# Patient Record
Sex: Female | Born: 1950
Health system: Southern US, Community
[De-identification: ages and names within clinical notes are randomized; demographics above are authoritative.]

## PROBLEM LIST (undated history)

## (undated) DIAGNOSIS — T7840XA Allergy, unspecified, initial encounter: Secondary | ICD-10-CM

## (undated) DIAGNOSIS — I10 Essential (primary) hypertension: Secondary | ICD-10-CM

## (undated) DIAGNOSIS — F32A Depression, unspecified: Secondary | ICD-10-CM

## (undated) DIAGNOSIS — E669 Obesity, unspecified: Secondary | ICD-10-CM

## (undated) DIAGNOSIS — M1990 Unspecified osteoarthritis, unspecified site: Secondary | ICD-10-CM

## (undated) DIAGNOSIS — H269 Unspecified cataract: Secondary | ICD-10-CM

## (undated) DIAGNOSIS — E785 Hyperlipidemia, unspecified: Secondary | ICD-10-CM

## (undated) DIAGNOSIS — F419 Anxiety disorder, unspecified: Secondary | ICD-10-CM

## (undated) DIAGNOSIS — F329 Major depressive disorder, single episode, unspecified: Secondary | ICD-10-CM

## (undated) DIAGNOSIS — K219 Gastro-esophageal reflux disease without esophagitis: Secondary | ICD-10-CM

## (undated) HISTORY — DX: Anxiety disorder, unspecified: F41.9

## (undated) HISTORY — DX: Essential (primary) hypertension: I10

## (undated) HISTORY — DX: Unspecified cataract: H26.9

## (undated) HISTORY — DX: Allergy, unspecified, initial encounter: T78.40XA

## (undated) HISTORY — DX: Depression, unspecified: F32.A

## (undated) HISTORY — PX: EYE SURGERY: SHX253

## (undated) HISTORY — DX: Major depressive disorder, single episode, unspecified: F32.9

## (undated) HISTORY — DX: Gastro-esophageal reflux disease without esophagitis: K21.9

## (undated) HISTORY — DX: Unspecified osteoarthritis, unspecified site: M19.90

## (undated) HISTORY — DX: Hyperlipidemia, unspecified: E78.5

---

## 1989-11-01 HISTORY — PX: ABDOMINAL HYSTERECTOMY: SHX81

## 1999-04-08 ENCOUNTER — Ambulatory Visit: Admission: RE | Admit: 1999-04-08 | Discharge: 1999-04-08 | Payer: Self-pay | Admitting: Occupational Medicine

## 1999-04-08 ENCOUNTER — Encounter: Payer: Self-pay | Admitting: Occupational Medicine

## 2006-06-13 ENCOUNTER — Ambulatory Visit: Payer: Self-pay | Admitting: Family Medicine

## 2006-06-14 ENCOUNTER — Emergency Department (HOSPITAL_COMMUNITY): Admission: EM | Admit: 2006-06-14 | Discharge: 2006-06-14 | Payer: Self-pay | Admitting: Emergency Medicine

## 2006-06-14 IMAGING — CR DG RIBS W/ CHEST 3+V*R*
5 series · 5 of 5 positions shown · non-contrast
Comparison: None available.

CLINICAL DATA: Pain right upper abdomen.  
CHEST ? 1 VIEW AND RIGHT RIBS ? 4 VIEW:

[t ribs ap/pa upper right *]
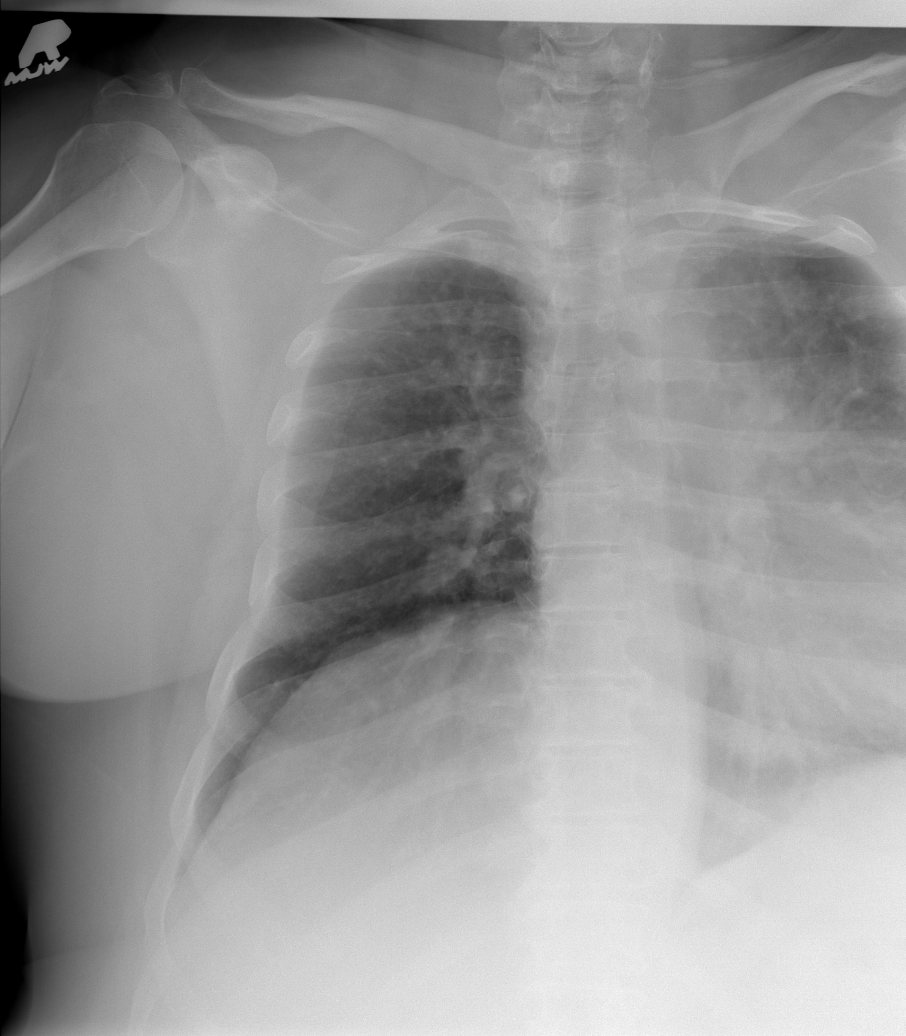

[t ribs ap/pa  lower right]
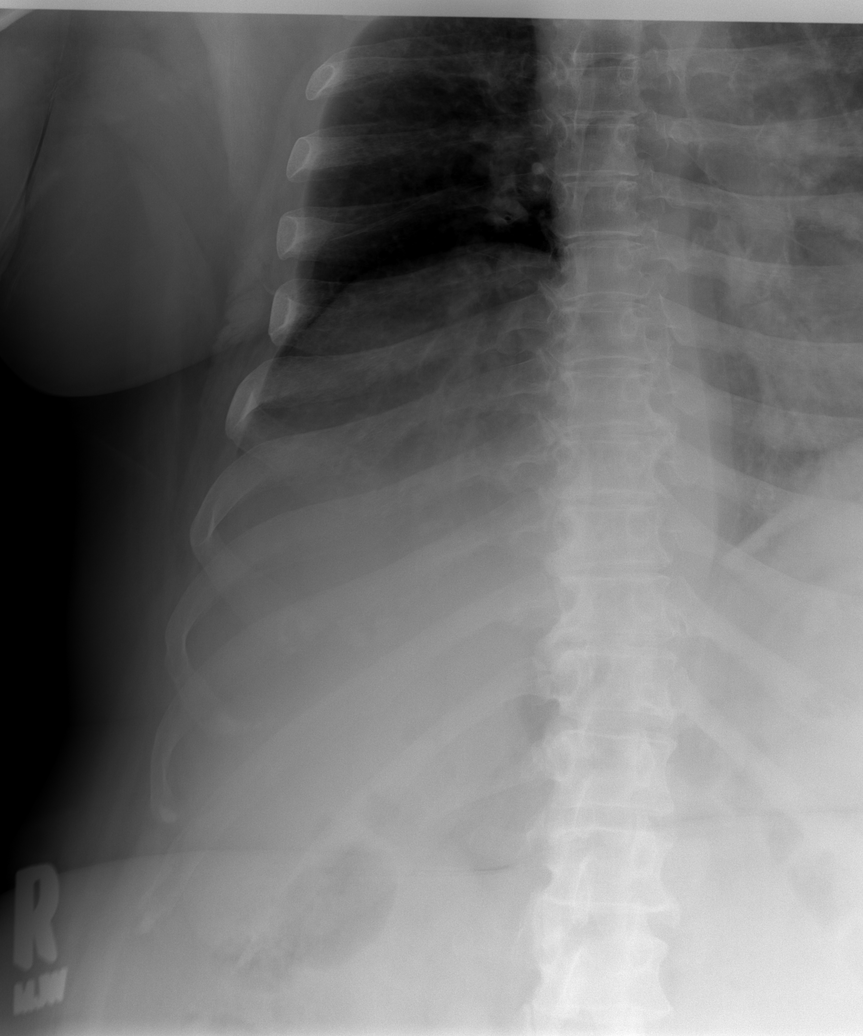

[t ribs obl. right (1 of 2)]
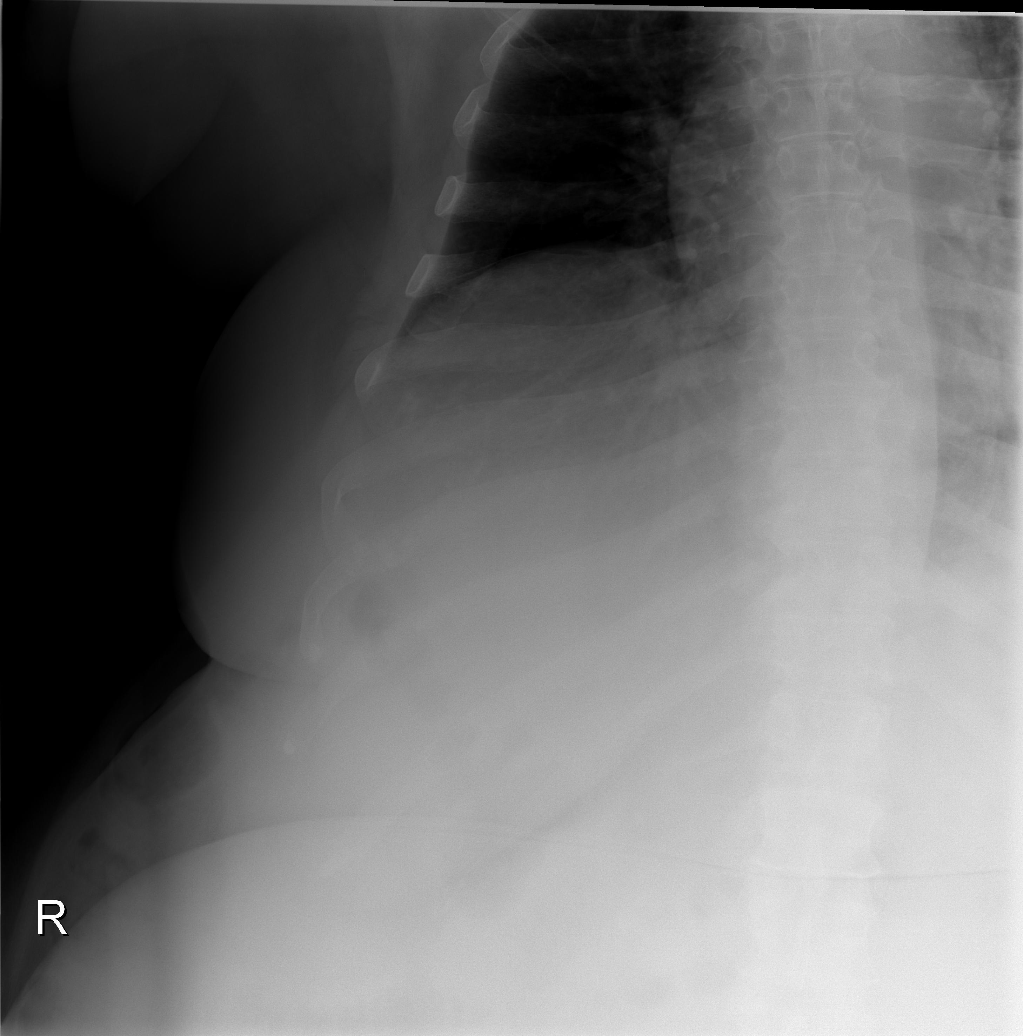

[t ribs obl. right (2 of 2)]
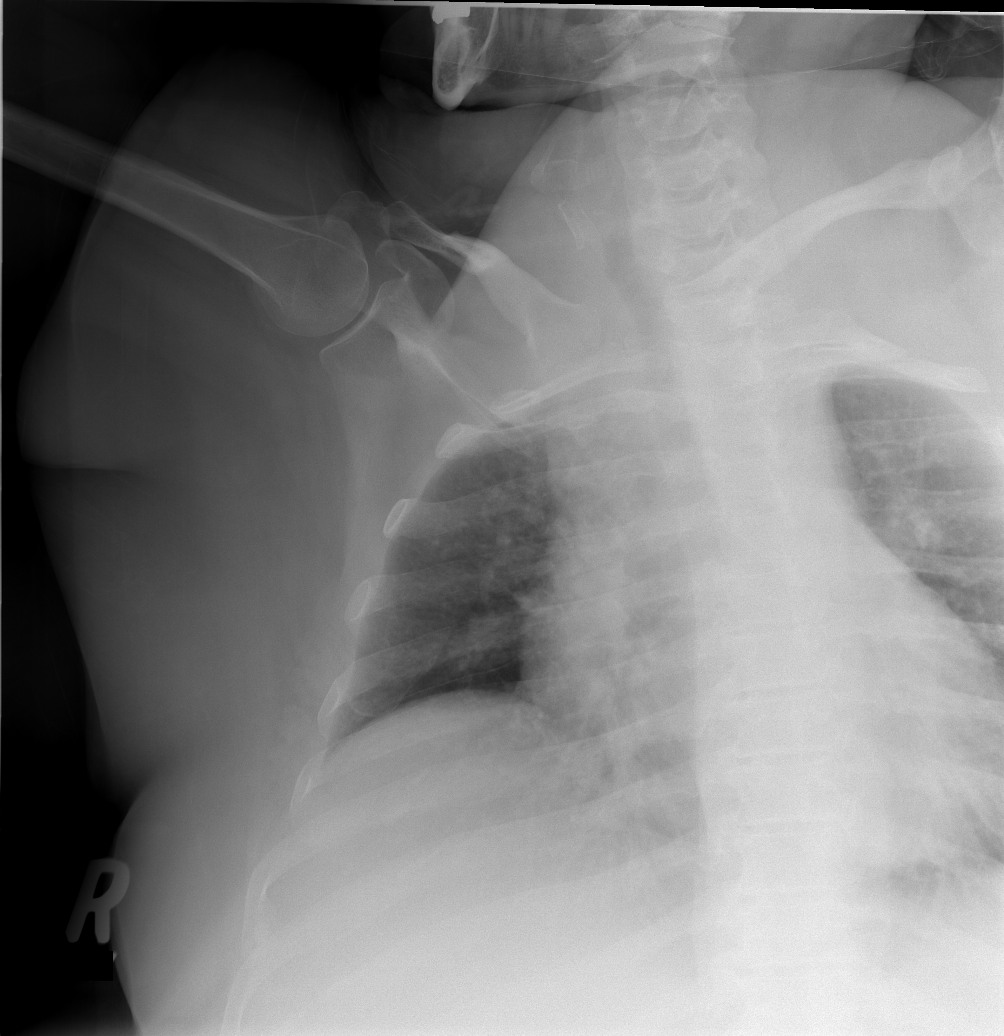

[view not recorded]
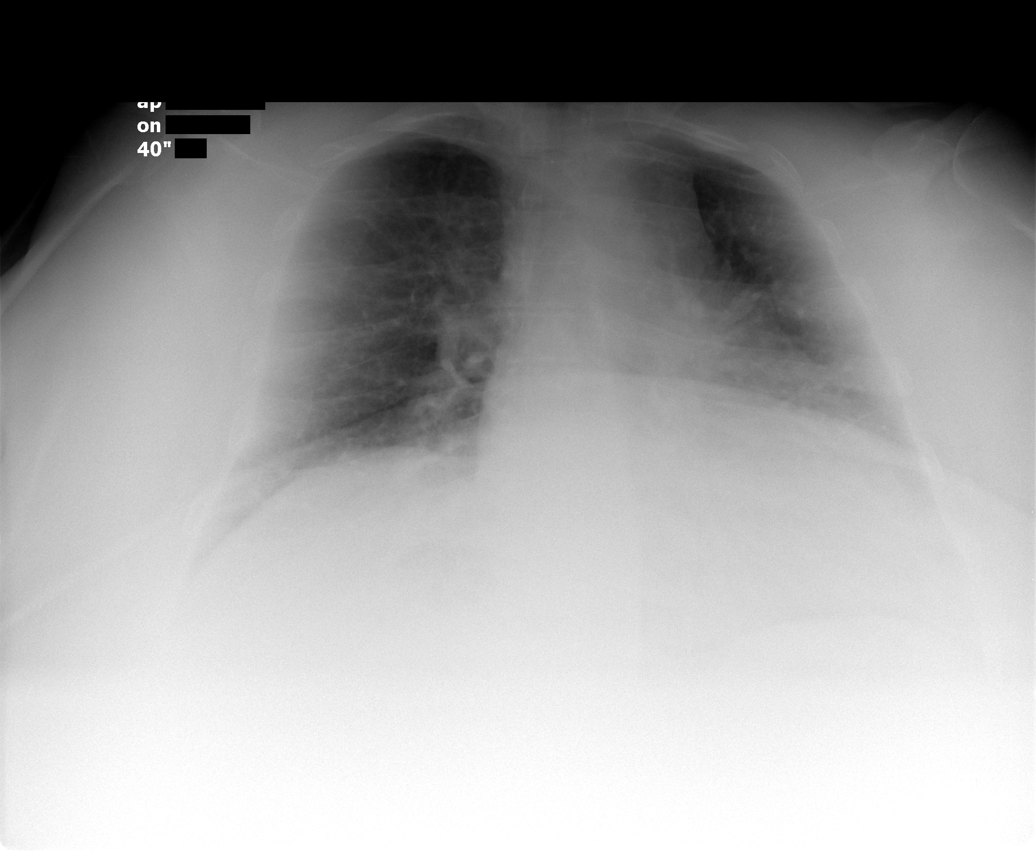

[5 of 5 positions shown; findings below may reference images not displayed]

FINDINGS: The lungs are under inflated.  There is elevation of the right hemidiaphragm.  The heart size appears enlarged.  There are no effusions or edema.  
Review of the ribs shows no fractures.
IMPRESSION: 1.  No rib fractures are identified.  
2.  Lungs are under inflated with elevation of the right hemidiaphragm.

## 2006-06-20 ENCOUNTER — Ambulatory Visit: Payer: Self-pay | Admitting: Family Medicine

## 2006-08-15 ENCOUNTER — Ambulatory Visit: Payer: Self-pay | Admitting: Family Medicine

## 2006-11-15 ENCOUNTER — Ambulatory Visit: Payer: Self-pay | Admitting: Family Medicine

## 2006-11-15 LAB — CONVERTED CEMR LAB
ALT: 17 units/L (ref 0–40)
AST: 16 units/L (ref 0–37)
BUN: 17 mg/dL (ref 6–23)
CO2: 30 meq/L (ref 19–32)
Calcium: 9.3 mg/dL (ref 8.4–10.5)
Chloride: 101 meq/L (ref 96–112)
Cholesterol: 140 mg/dL (ref 0–200)
Creatinine, Ser: 0.7 mg/dL (ref 0.4–1.2)
GFR calc Af Amer: 112 mL/min
GFR calc non Af Amer: 92 mL/min
Glucose, Bld: 126 mg/dL — ABNORMAL HIGH (ref 70–99)
HDL: 54.8 mg/dL (ref >39.0)
Hgb A1c MFr Bld: 6.6 % — ABNORMAL HIGH (ref 4.6–6.0)
LDL Cholesterol: 60 mg/dL (ref 0–99)
Potassium: 4.4 meq/L (ref 3.5–5.1)
Rhuematoid fact SerPl-aCnc: <20 intl units/mL — ABNORMAL LOW (ref 0.0–20.0)
Sodium: 138 meq/L (ref 135–145)
Total CHOL/HDL Ratio: 2.6
Triglycerides: 127 mg/dL (ref 0–149)
VLDL: 25 mg/dL (ref 0–40)

## 2006-11-16 ENCOUNTER — Encounter: Admission: RE | Admit: 2006-11-16 | Discharge: 2006-11-16 | Payer: Self-pay | Admitting: Family Medicine

## 2006-11-16 IMAGING — CR DG HAND COMPLETE 3+V*R*
3 series · 3 of 3 positions shown · non-contrast
Comparison: None.

CLINICAL DATA: Arthralgia.  
 RIGHT HAND THREE VIEWS:

[view not recorded (1 of 3)]
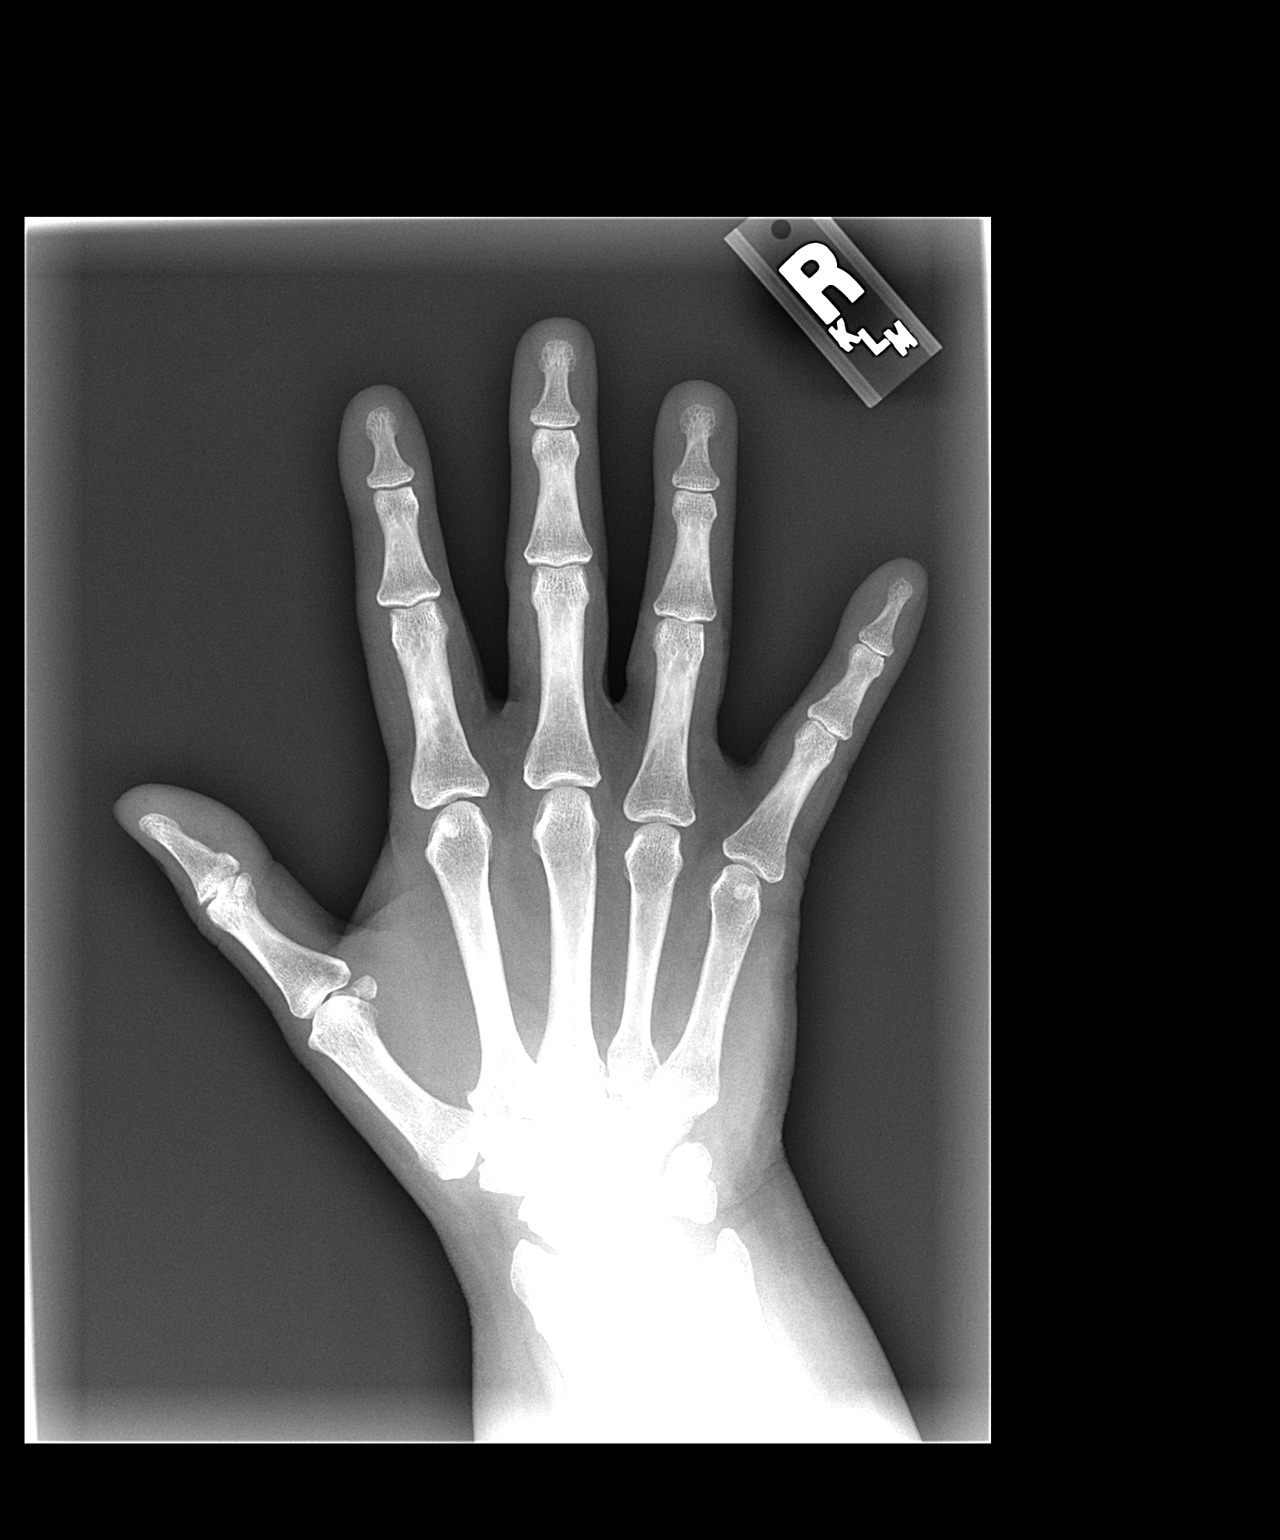

[view not recorded (2 of 3)]
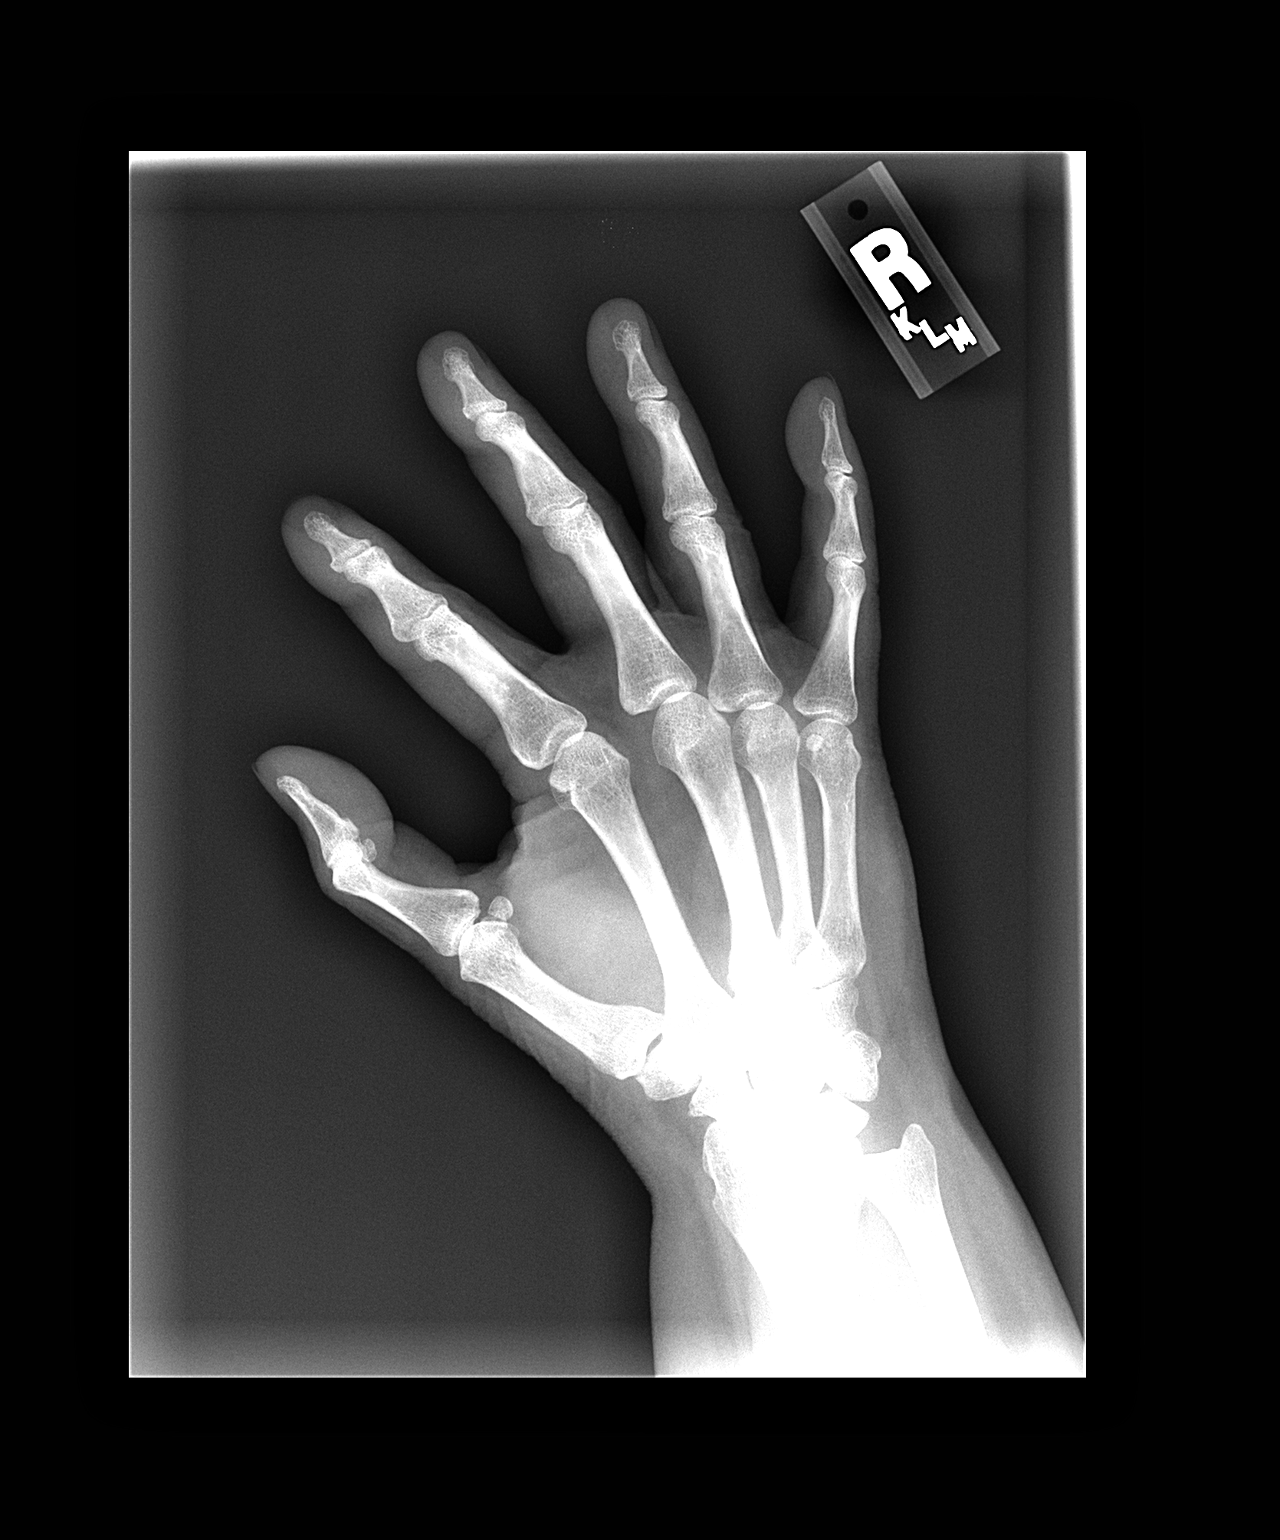

[view not recorded (3 of 3)]
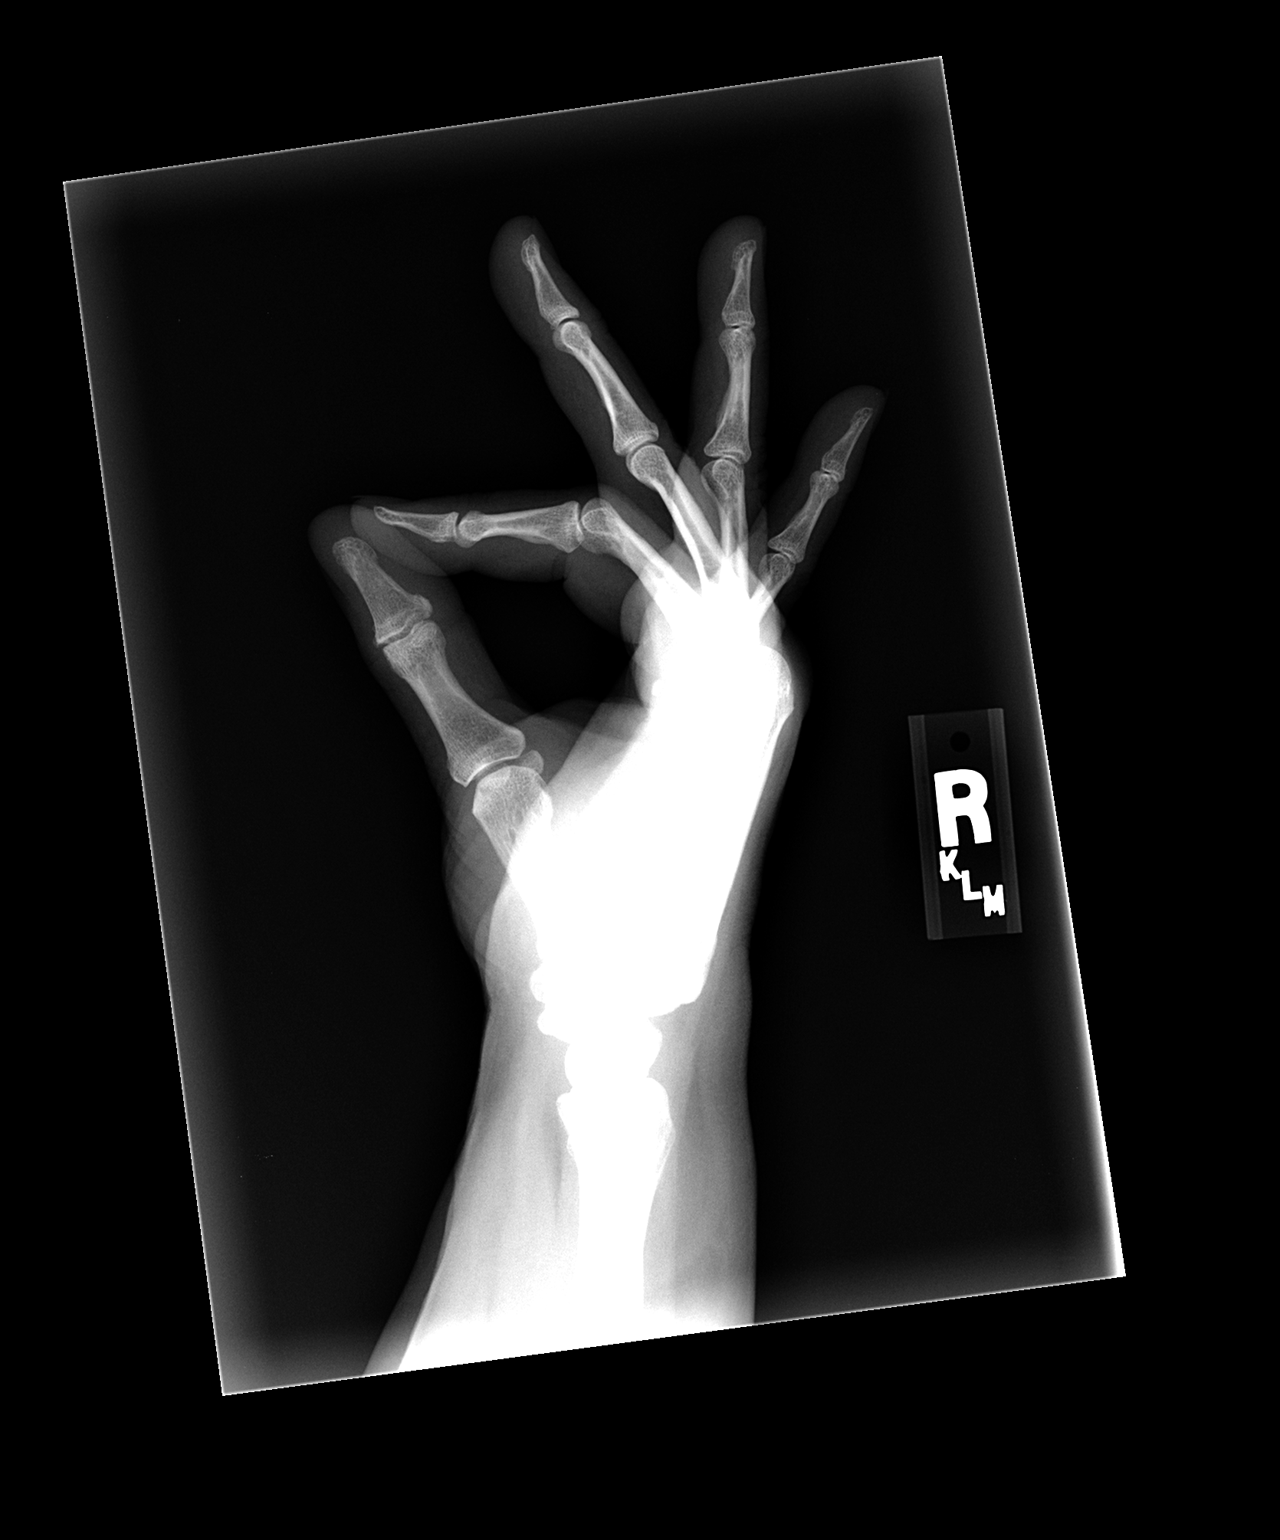

[3 of 3 positions shown; findings below may reference images not displayed]

FINDINGS: No osteophytosis in the interphalangeal joint.  First carpal metacarpal joint is unremarkable.  No erosions.
IMPRESSION: No significant degenerative changes.

## 2006-12-14 ENCOUNTER — Ambulatory Visit: Payer: Self-pay | Admitting: Family Medicine

## 2006-12-14 DIAGNOSIS — K219 Gastro-esophageal reflux disease without esophagitis: Secondary | ICD-10-CM | POA: Insufficient documentation

## 2006-12-14 DIAGNOSIS — I10 Essential (primary) hypertension: Secondary | ICD-10-CM | POA: Insufficient documentation

## 2006-12-14 DIAGNOSIS — E785 Hyperlipidemia, unspecified: Secondary | ICD-10-CM | POA: Insufficient documentation

## 2006-12-14 DIAGNOSIS — F329 Major depressive disorder, single episode, unspecified: Secondary | ICD-10-CM | POA: Insufficient documentation

## 2006-12-14 DIAGNOSIS — F321 Major depressive disorder, single episode, moderate: Secondary | ICD-10-CM | POA: Insufficient documentation

## 2007-03-02 ENCOUNTER — Ambulatory Visit: Payer: Self-pay | Admitting: Family Medicine

## 2007-03-02 LAB — CONVERTED CEMR LAB
ALT: 14 units/L (ref 0–40)
AST: 18 units/L (ref 0–37)
BUN: 14 mg/dL (ref 6–23)
CO2: 34 meq/L — ABNORMAL HIGH (ref 19–32)
Calcium: 8.9 mg/dL (ref 8.4–10.5)
Chloride: 102 meq/L (ref 96–112)
Creatinine, Ser: 0.6 mg/dL (ref 0.4–1.2)
GFR calc Af Amer: 133 mL/min
GFR calc non Af Amer: 110 mL/min
Glucose, Bld: 126 mg/dL — ABNORMAL HIGH (ref 70–99)
Hgb A1c MFr Bld: 6.8 % — ABNORMAL HIGH (ref 4.6–6.0)
Potassium: 4.3 meq/L (ref 3.5–5.1)
Sodium: 143 meq/L (ref 135–145)

## 2007-04-18 ENCOUNTER — Telehealth (INDEPENDENT_AMBULATORY_CARE_PROVIDER_SITE_OTHER): Payer: Self-pay | Admitting: *Deleted

## 2007-04-19 ENCOUNTER — Ambulatory Visit: Payer: Self-pay | Admitting: Family Medicine

## 2007-04-19 DIAGNOSIS — S301XXA Contusion of abdominal wall, initial encounter: Secondary | ICD-10-CM | POA: Insufficient documentation

## 2007-04-23 LAB — CONVERTED CEMR LAB
Basophils Absolute: 0 10*3/uL (ref 0.0–0.1)
Basophils Relative: 0.1 % (ref 0.0–1.0)
Eosinophils Absolute: 0.1 10*3/uL (ref 0.0–0.6)
Eosinophils Relative: 1.7 % (ref 0.0–5.0)
HCT: 36.9 % (ref 36.0–46.0)
Hemoglobin: 12.2 g/dL (ref 12.0–15.0)
INR: 1.2 (ref 0.9–2.0)
Lymphocytes Relative: 17.2 % (ref 12.0–46.0)
MCHC: 33.1 g/dL (ref 30.0–36.0)
MCV: 81.5 fL (ref 78.0–100.0)
Monocytes Absolute: 0.5 10*3/uL (ref 0.2–0.7)
Monocytes Relative: 6.8 % (ref 3.0–11.0)
Neutro Abs: 5.6 10*3/uL (ref 1.4–7.7)
Neutrophils Relative %: 74.2 % (ref 43.0–77.0)
Platelets: 248 10*3/uL (ref 150–400)
Prothrombin Time: 13.3 s (ref 10.0–14.0)
RBC: 4.53 M/uL (ref 3.87–5.11)
RDW: 15.8 % — ABNORMAL HIGH (ref 11.5–14.6)
WBC: 7.5 10*3/uL (ref 4.5–10.5)
aPTT: 37.3 s — ABNORMAL HIGH (ref 26.5–36.5)

## 2007-04-24 ENCOUNTER — Encounter (INDEPENDENT_AMBULATORY_CARE_PROVIDER_SITE_OTHER): Payer: Self-pay | Admitting: *Deleted

## 2007-07-04 ENCOUNTER — Ambulatory Visit: Payer: Self-pay | Admitting: Family Medicine

## 2007-07-05 ENCOUNTER — Telehealth (INDEPENDENT_AMBULATORY_CARE_PROVIDER_SITE_OTHER): Payer: Self-pay | Admitting: *Deleted

## 2007-07-05 LAB — CONVERTED CEMR LAB
ALT: 17 units/L (ref 0–35)
AST: 26 units/L (ref 0–37)
Cholesterol: 164 mg/dL (ref 0–200)
HDL: 55.5 mg/dL (ref >39.0)
Hgb A1c MFr Bld: 7 % — ABNORMAL HIGH (ref 4.6–6.0)
LDL Cholesterol: 81 mg/dL (ref 0–99)
Total CHOL/HDL Ratio: 3
Triglycerides: 140 mg/dL (ref 0–149)
VLDL: 28 mg/dL (ref 0–40)

## 2007-07-31 ENCOUNTER — Ambulatory Visit: Payer: Self-pay | Admitting: Family Medicine

## 2007-07-31 DIAGNOSIS — J209 Acute bronchitis, unspecified: Secondary | ICD-10-CM | POA: Insufficient documentation

## 2007-07-31 LAB — CONVERTED CEMR LAB: Rapid Strep: NEGATIVE

## 2007-08-01 ENCOUNTER — Telehealth (INDEPENDENT_AMBULATORY_CARE_PROVIDER_SITE_OTHER): Payer: Self-pay | Admitting: *Deleted

## 2007-09-25 ENCOUNTER — Telehealth (INDEPENDENT_AMBULATORY_CARE_PROVIDER_SITE_OTHER): Payer: Self-pay | Admitting: *Deleted

## 2007-10-10 ENCOUNTER — Telehealth (INDEPENDENT_AMBULATORY_CARE_PROVIDER_SITE_OTHER): Payer: Self-pay | Admitting: *Deleted

## 2007-10-30 ENCOUNTER — Telehealth (INDEPENDENT_AMBULATORY_CARE_PROVIDER_SITE_OTHER): Payer: Self-pay | Admitting: *Deleted

## 2008-01-15 ENCOUNTER — Ambulatory Visit: Payer: Self-pay | Admitting: Family Medicine

## 2008-01-21 LAB — CONVERTED CEMR LAB
ALT: 15 units/L (ref 0–35)
AST: 23 units/L (ref 0–37)
BUN: 11 mg/dL (ref 6–23)
CO2: 33 meq/L — ABNORMAL HIGH (ref 19–32)
Calcium: 9.3 mg/dL (ref 8.4–10.5)
Chloride: 103 meq/L (ref 96–112)
Cholesterol: 141 mg/dL (ref 0–200)
Creatinine, Ser: 0.7 mg/dL (ref 0.4–1.2)
GFR calc Af Amer: 111 mL/min
GFR calc non Af Amer: 92 mL/min
Glucose, Bld: 157 mg/dL — ABNORMAL HIGH (ref 70–99)
HDL: 55.9 mg/dL (ref >39.0)
Hgb A1c MFr Bld: 6.8 % — ABNORMAL HIGH (ref 4.6–6.0)
LDL Cholesterol: 65 mg/dL (ref 0–99)
Potassium: 4.1 meq/L (ref 3.5–5.1)
Sodium: 141 meq/L (ref 135–145)
Total CHOL/HDL Ratio: 2.5
Triglycerides: 100 mg/dL (ref 0–149)
VLDL: 20 mg/dL (ref 0–40)

## 2008-01-22 ENCOUNTER — Encounter (INDEPENDENT_AMBULATORY_CARE_PROVIDER_SITE_OTHER): Payer: Self-pay | Admitting: *Deleted

## 2008-01-24 IMAGING — CR DG CHEST 2V
2 series · 2 of 2 positions shown · non-contrast
Comparison: [DATE].

CLINICAL DATA: Bronchitis.
 CHEST - 2 VIEW:

[view not recorded (1 of 2)]
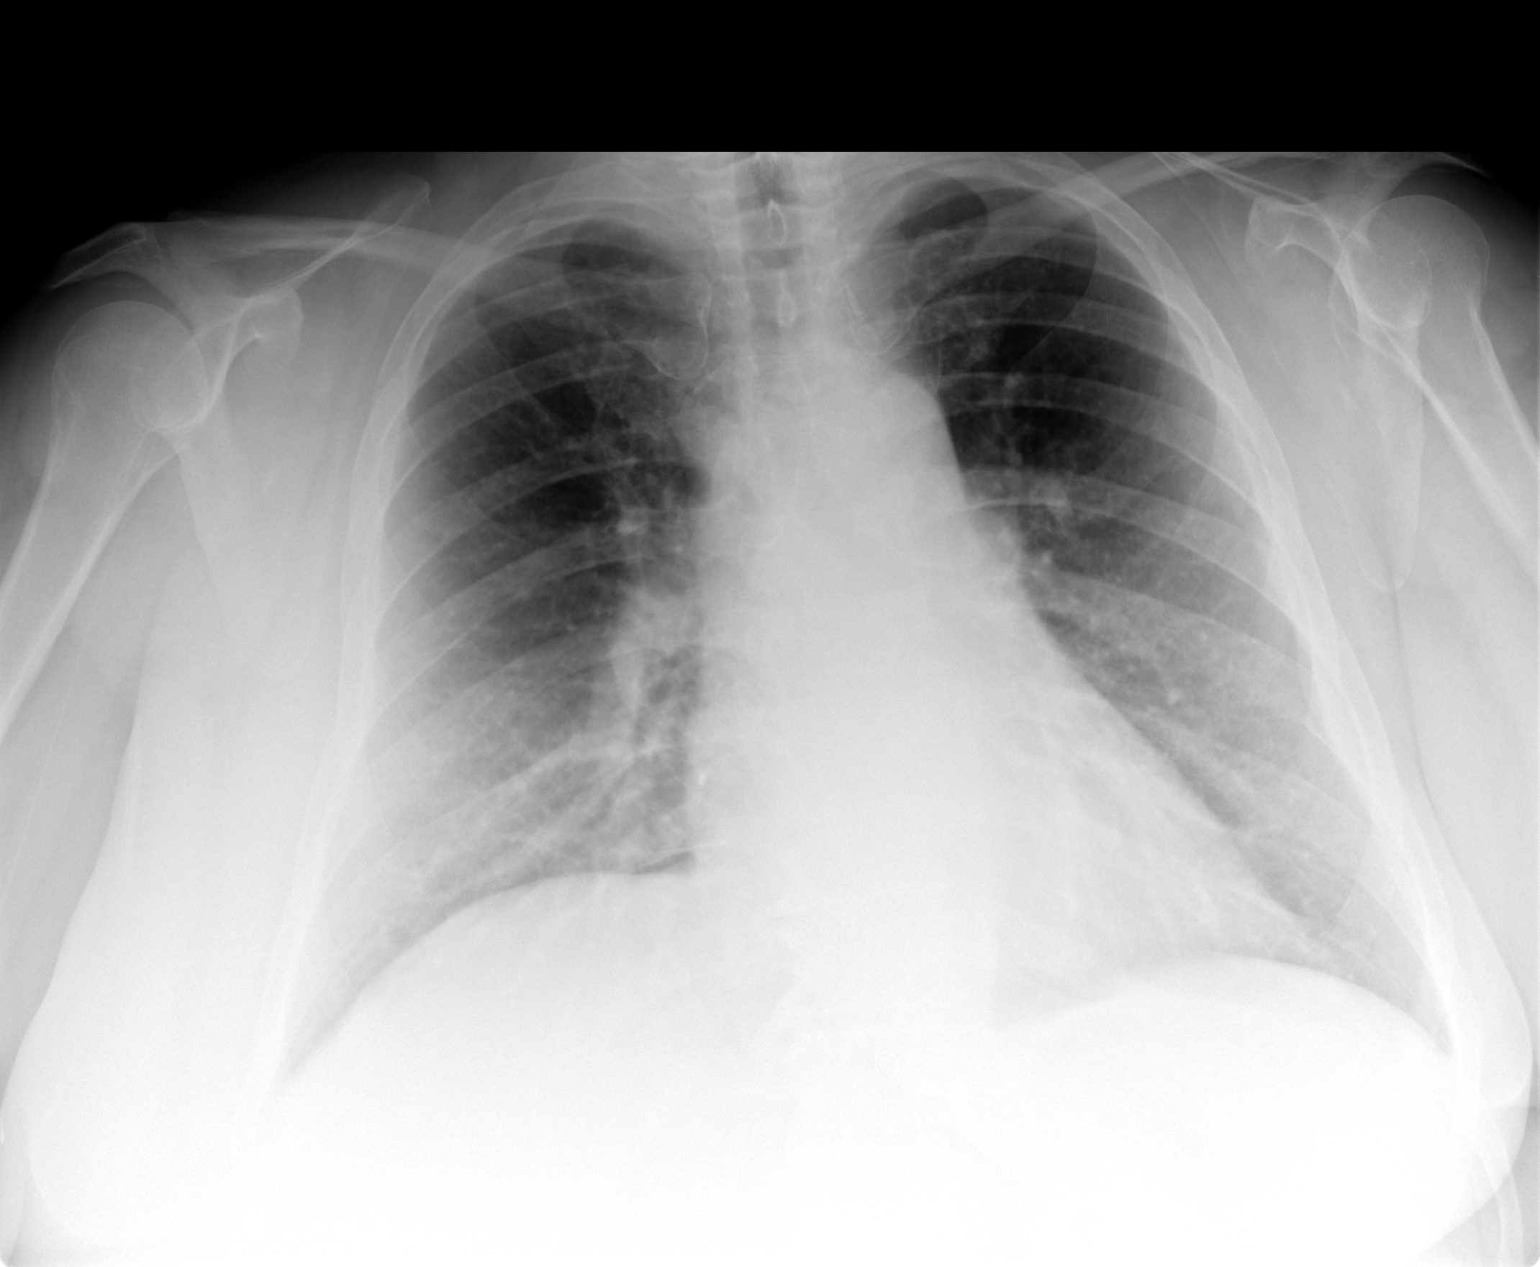

[view not recorded (2 of 2)]
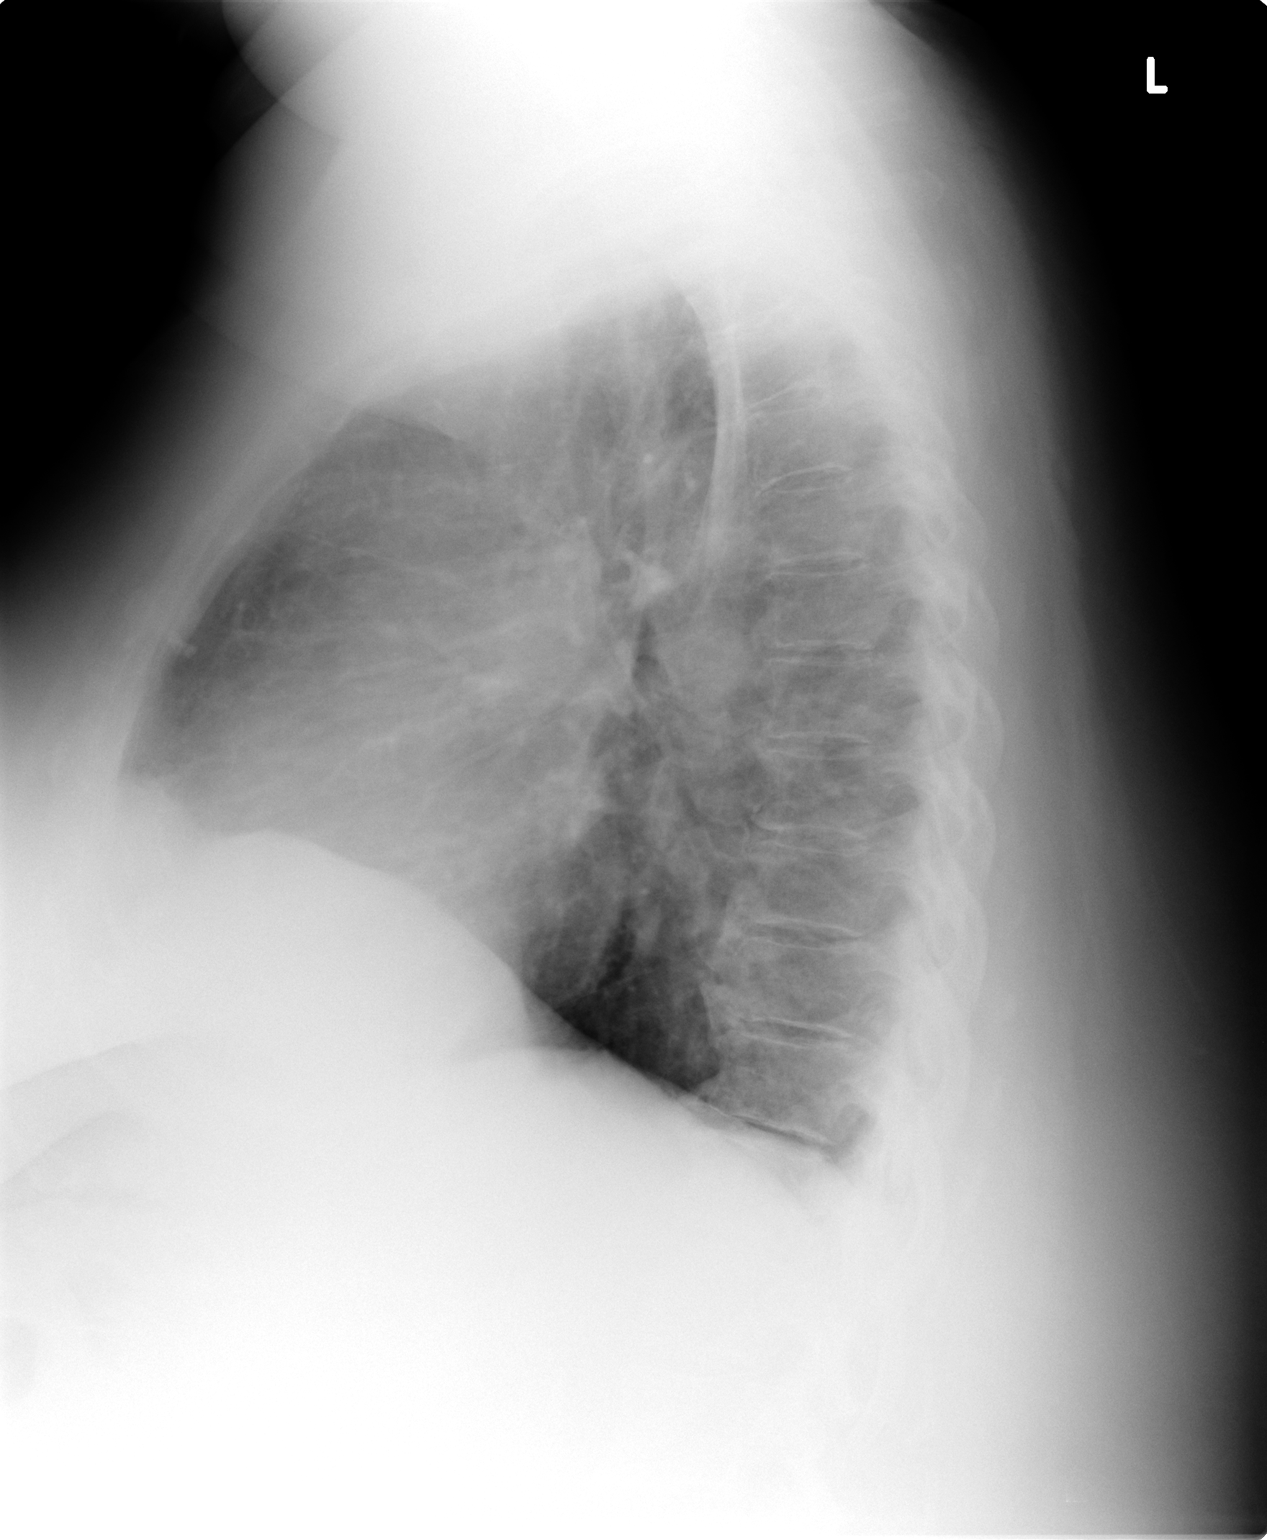

[2 of 2 positions shown; findings below may reference images not displayed]

FINDINGS: Mild cardiomegaly and mild peribronchial thickening are stable.  There is no evidence of focal air space disease, pleural effusions, or pneumothorax.  Elevation of the right hemidiaphragm is unchanged.
IMPRESSION: Stable mild peribronchial thickening without focal air space disease.

## 2008-09-30 ENCOUNTER — Telehealth (INDEPENDENT_AMBULATORY_CARE_PROVIDER_SITE_OTHER): Payer: Self-pay | Admitting: *Deleted

## 2008-10-16 ENCOUNTER — Telehealth (INDEPENDENT_AMBULATORY_CARE_PROVIDER_SITE_OTHER): Payer: Self-pay | Admitting: *Deleted

## 2008-10-18 ENCOUNTER — Telehealth (INDEPENDENT_AMBULATORY_CARE_PROVIDER_SITE_OTHER): Payer: Self-pay | Admitting: *Deleted

## 2008-10-21 ENCOUNTER — Telehealth (INDEPENDENT_AMBULATORY_CARE_PROVIDER_SITE_OTHER): Payer: Self-pay | Admitting: *Deleted

## 2008-11-14 ENCOUNTER — Encounter: Payer: Self-pay | Admitting: Family Medicine

## 2008-11-25 ENCOUNTER — Telehealth (INDEPENDENT_AMBULATORY_CARE_PROVIDER_SITE_OTHER): Payer: Self-pay | Admitting: *Deleted

## 2008-12-02 ENCOUNTER — Telehealth (INDEPENDENT_AMBULATORY_CARE_PROVIDER_SITE_OTHER): Payer: Self-pay | Admitting: *Deleted

## 2008-12-13 ENCOUNTER — Ambulatory Visit: Payer: Self-pay | Admitting: Family Medicine

## 2008-12-20 ENCOUNTER — Ambulatory Visit: Payer: Self-pay | Admitting: Family Medicine

## 2008-12-27 ENCOUNTER — Encounter (INDEPENDENT_AMBULATORY_CARE_PROVIDER_SITE_OTHER): Payer: Self-pay | Admitting: *Deleted

## 2008-12-27 ENCOUNTER — Telehealth: Payer: Self-pay | Admitting: Family Medicine

## 2008-12-27 LAB — CONVERTED CEMR LAB
ALT: 14 units/L (ref 0–35)
AST: 22 units/L (ref 0–37)
Albumin: 3.1 g/dL — ABNORMAL LOW (ref 3.5–5.2)
Alkaline Phosphatase: 46 units/L (ref 39–117)
BUN: 11 mg/dL (ref 6–23)
Basophils Absolute: 0 10*3/uL (ref 0.0–0.1)
Basophils Relative: 0.2 % (ref 0.0–3.0)
Bilirubin, Direct: 0.1 mg/dL (ref 0.0–0.3)
CO2: 30 meq/L (ref 19–32)
Calcium: 8.6 mg/dL (ref 8.4–10.5)
Chloride: 104 meq/L (ref 96–112)
Cholesterol: 139 mg/dL (ref 0–200)
Creatinine, Ser: 0.6 mg/dL (ref 0.4–1.2)
Creatinine,U: 184.1 mg/dL
Eosinophils Absolute: 0.2 10*3/uL (ref 0.0–0.7)
Eosinophils Relative: 3.2 % (ref 0.0–5.0)
GFR calc Af Amer: 133 mL/min
GFR calc non Af Amer: 110 mL/min
Glucose, Bld: 155 mg/dL — ABNORMAL HIGH (ref 70–99)
HCT: 35.3 % — ABNORMAL LOW (ref 36.0–46.0)
HDL: 53.7 mg/dL (ref >39.0)
Hemoglobin: 12 g/dL (ref 12.0–15.0)
Hgb A1c MFr Bld: 6.5 % — ABNORMAL HIGH (ref 4.6–6.0)
LDL Cholesterol: 62 mg/dL (ref 0–99)
Lymphocytes Relative: 16.2 % (ref 12.0–46.0)
MCHC: 33.9 g/dL (ref 30.0–36.0)
MCV: 82.2 fL (ref 78.0–100.0)
Microalb Creat Ratio: 6.5 mg/g (ref 0.0–30.0)
Microalb, Ur: 1.2 mg/dL (ref 0.0–1.9)
Monocytes Absolute: 0.3 10*3/uL (ref 0.1–1.0)
Monocytes Relative: 5.9 % (ref 3.0–12.0)
Neutro Abs: 3.9 10*3/uL (ref 1.4–7.7)
Neutrophils Relative %: 74.5 % (ref 43.0–77.0)
Platelets: 168 10*3/uL (ref 150–400)
Potassium: 4.2 meq/L (ref 3.5–5.1)
RBC: 4.29 M/uL (ref 3.87–5.11)
RDW: 15.8 % — ABNORMAL HIGH (ref 11.5–14.6)
Sodium: 143 meq/L (ref 135–145)
Total Bilirubin: 0.7 mg/dL (ref 0.3–1.2)
Total CHOL/HDL Ratio: 2.6
Total Protein: 6.1 g/dL (ref 6.0–8.3)
Triglycerides: 115 mg/dL (ref 0–149)
VLDL: 23 mg/dL (ref 0–40)
WBC: 5.2 10*3/uL (ref 4.5–10.5)

## 2008-12-30 ENCOUNTER — Ambulatory Visit: Payer: Self-pay | Admitting: Family Medicine

## 2008-12-30 DIAGNOSIS — B354 Tinea corporis: Secondary | ICD-10-CM | POA: Insufficient documentation

## 2009-01-24 ENCOUNTER — Telehealth (INDEPENDENT_AMBULATORY_CARE_PROVIDER_SITE_OTHER): Payer: Self-pay | Admitting: *Deleted

## 2009-04-01 ENCOUNTER — Encounter: Payer: Self-pay | Admitting: Family Medicine

## 2009-04-11 ENCOUNTER — Telehealth (INDEPENDENT_AMBULATORY_CARE_PROVIDER_SITE_OTHER): Payer: Self-pay | Admitting: *Deleted

## 2009-05-12 ENCOUNTER — Ambulatory Visit: Payer: Self-pay | Admitting: Family Medicine

## 2009-05-15 ENCOUNTER — Ambulatory Visit: Payer: Self-pay | Admitting: Family Medicine

## 2009-05-15 LAB — HM DIABETES FOOT EXAM

## 2009-05-19 LAB — CONVERTED CEMR LAB
ALT: 10 units/L (ref 0–35)
AST: 17 units/L (ref 0–37)
Albumin: 2.9 g/dL — ABNORMAL LOW (ref 3.5–5.2)
Alkaline Phosphatase: 40 units/L (ref 39–117)
BUN: 14 mg/dL (ref 6–23)
Bilirubin, Direct: 0.1 mg/dL (ref 0.0–0.3)
CO2: 31 meq/L (ref 19–32)
Calcium: 8.6 mg/dL (ref 8.4–10.5)
Chloride: 104 meq/L (ref 96–112)
Cholesterol: 138 mg/dL (ref 0–200)
Creatinine, Ser: 0.7 mg/dL (ref 0.4–1.2)
GFR calc non Af Amer: 91.28 mL/min (ref >60)
Glucose, Bld: 139 mg/dL — ABNORMAL HIGH (ref 70–99)
HDL: 53.6 mg/dL (ref >39.00)
Hgb A1c MFr Bld: 6.3 % (ref 4.6–6.5)
LDL Cholesterol: 61 mg/dL (ref 0–99)
Potassium: 4 meq/L (ref 3.5–5.1)
Sodium: 143 meq/L (ref 135–145)
Total Bilirubin: 0.6 mg/dL (ref 0.3–1.2)
Total CHOL/HDL Ratio: 3
Total Protein: 6.1 g/dL (ref 6.0–8.3)
Triglycerides: 118 mg/dL (ref 0.0–149.0)
VLDL: 23.6 mg/dL (ref 0.0–40.0)

## 2009-05-20 ENCOUNTER — Encounter (INDEPENDENT_AMBULATORY_CARE_PROVIDER_SITE_OTHER): Payer: Self-pay | Admitting: *Deleted

## 2009-06-02 ENCOUNTER — Telehealth (INDEPENDENT_AMBULATORY_CARE_PROVIDER_SITE_OTHER): Payer: Self-pay | Admitting: *Deleted

## 2009-07-11 ENCOUNTER — Telehealth (INDEPENDENT_AMBULATORY_CARE_PROVIDER_SITE_OTHER): Payer: Self-pay | Admitting: *Deleted

## 2009-07-16 LAB — HM DIABETES EYE EXAM: HM Diabetic Eye Exam: NORMAL

## 2009-07-21 ENCOUNTER — Encounter: Payer: Self-pay | Admitting: Family Medicine

## 2009-08-06 ENCOUNTER — Ambulatory Visit: Payer: Self-pay | Admitting: Family Medicine

## 2009-08-06 DIAGNOSIS — R197 Diarrhea, unspecified: Secondary | ICD-10-CM | POA: Insufficient documentation

## 2009-08-08 ENCOUNTER — Encounter: Payer: Self-pay | Admitting: Family Medicine

## 2009-08-11 ENCOUNTER — Encounter: Payer: Self-pay | Admitting: Family Medicine

## 2009-08-12 ENCOUNTER — Encounter: Payer: Self-pay | Admitting: Family Medicine

## 2009-08-12 LAB — CONVERTED CEMR LAB
ALT: 15 units/L (ref 0–35)
AST: 17 units/L (ref 0–37)
Albumin: 3.2 g/dL — ABNORMAL LOW (ref 3.5–5.2)
Alkaline Phosphatase: 42 units/L (ref 39–117)
BUN: 10 mg/dL (ref 6–23)
Basophils Absolute: 0 10*3/uL (ref 0.0–0.1)
Basophils Relative: 0.1 % (ref 0.0–3.0)
Bilirubin, Direct: 0 mg/dL (ref 0.0–0.3)
CO2: 28 meq/L (ref 19–32)
Calcium: 9.1 mg/dL (ref 8.4–10.5)
Chloride: 99 meq/L (ref 96–112)
Creatinine, Ser: 0.7 mg/dL (ref 0.4–1.2)
Eosinophils Absolute: 0.2 10*3/uL (ref 0.0–0.7)
Eosinophils Relative: 2.1 % (ref 0.0–5.0)
GFR calc non Af Amer: 91.2 mL/min (ref >60)
Glucose, Bld: 106 mg/dL — ABNORMAL HIGH (ref 70–99)
HCT: 40.3 % (ref 36.0–46.0)
Hemoglobin: 13 g/dL (ref 12.0–15.0)
Lymphocytes Relative: 13.7 % (ref 12.0–46.0)
Lymphs Abs: 1 10*3/uL (ref 0.7–4.0)
MCHC: 32.2 g/dL (ref 30.0–36.0)
MCV: 83.1 fL (ref 78.0–100.0)
Monocytes Absolute: 0.4 10*3/uL (ref 0.1–1.0)
Monocytes Relative: 5.3 % (ref 3.0–12.0)
Neutro Abs: 6 10*3/uL (ref 1.4–7.7)
Neutrophils Relative %: 78.8 % — ABNORMAL HIGH (ref 43.0–77.0)
Platelets: 206 10*3/uL (ref 150.0–400.0)
Potassium: 4.7 meq/L (ref 3.5–5.1)
RBC: 4.85 M/uL (ref 3.87–5.11)
RDW: 15.9 % — ABNORMAL HIGH (ref 11.5–14.6)
Sodium: 140 meq/L (ref 135–145)
TSH: 3.15 microintl units/mL (ref 0.35–5.50)
Total Bilirubin: 0.5 mg/dL (ref 0.3–1.2)
Total Protein: 6.6 g/dL (ref 6.0–8.3)
WBC: 7.6 10*3/uL (ref 4.5–10.5)

## 2009-08-17 ENCOUNTER — Encounter: Payer: Self-pay | Admitting: Family Medicine

## 2009-11-05 ENCOUNTER — Ambulatory Visit: Payer: Self-pay | Admitting: Family Medicine

## 2009-11-10 ENCOUNTER — Ambulatory Visit: Payer: Self-pay | Admitting: Family Medicine

## 2009-11-10 LAB — CONVERTED CEMR LAB
ALT: 11 units/L (ref 0–35)
AST: 17 units/L (ref 0–37)
Albumin: 2.9 g/dL — ABNORMAL LOW (ref 3.5–5.2)
Alkaline Phosphatase: 39 units/L (ref 39–117)
BUN: 8 mg/dL (ref 6–23)
Bilirubin, Direct: 0.1 mg/dL (ref 0.0–0.3)
CO2: 30 meq/L (ref 19–32)
Calcium: 9 mg/dL (ref 8.4–10.5)
Chloride: 106 meq/L (ref 96–112)
Cholesterol: 114 mg/dL (ref 0–200)
Creatinine, Ser: 0.7 mg/dL (ref 0.4–1.2)
GFR calc non Af Amer: 91.12 mL/min (ref >60)
Glucose, Bld: 139 mg/dL — ABNORMAL HIGH (ref 70–99)
HDL: 54.2 mg/dL (ref >39.00)
Hgb A1c MFr Bld: 6 % (ref 4.6–6.5)
LDL Cholesterol: 34 mg/dL (ref 0–99)
Potassium: 4.3 meq/L (ref 3.5–5.1)
Sodium: 143 meq/L (ref 135–145)
Total Bilirubin: 0.7 mg/dL (ref 0.3–1.2)
Total CHOL/HDL Ratio: 2
Total Protein: 6.2 g/dL (ref 6.0–8.3)
Triglycerides: 128 mg/dL (ref 0.0–149.0)
VLDL: 25.6 mg/dL (ref 0.0–40.0)

## 2009-12-01 ENCOUNTER — Telehealth (INDEPENDENT_AMBULATORY_CARE_PROVIDER_SITE_OTHER): Payer: Self-pay | Admitting: *Deleted

## 2009-12-12 ENCOUNTER — Telehealth: Payer: Self-pay | Admitting: Family Medicine

## 2009-12-12 ENCOUNTER — Encounter: Payer: Self-pay | Admitting: Family Medicine

## 2009-12-19 ENCOUNTER — Telehealth (INDEPENDENT_AMBULATORY_CARE_PROVIDER_SITE_OTHER): Payer: Self-pay | Admitting: *Deleted

## 2009-12-26 ENCOUNTER — Telehealth (INDEPENDENT_AMBULATORY_CARE_PROVIDER_SITE_OTHER): Payer: Self-pay | Admitting: *Deleted

## 2010-01-29 ENCOUNTER — Encounter: Payer: Self-pay | Admitting: Family Medicine

## 2010-04-27 ENCOUNTER — Ambulatory Visit: Payer: Self-pay | Admitting: Family Medicine

## 2010-04-28 ENCOUNTER — Telehealth (INDEPENDENT_AMBULATORY_CARE_PROVIDER_SITE_OTHER): Payer: Self-pay | Admitting: *Deleted

## 2010-04-28 LAB — CONVERTED CEMR LAB
ALT: 12 units/L (ref 0–35)
AST: 15 units/L (ref 0–37)
Albumin: 3.3 g/dL — ABNORMAL LOW (ref 3.5–5.2)
Alkaline Phosphatase: 45 units/L (ref 39–117)
BUN: 17 mg/dL (ref 6–23)
Bilirubin, Direct: 0.2 mg/dL (ref 0.0–0.3)
CO2: 27 meq/L (ref 19–32)
Calcium: 8.8 mg/dL (ref 8.4–10.5)
Chloride: 103 meq/L (ref 96–112)
Cholesterol: 141 mg/dL (ref 0–200)
Creatinine, Ser: 0.8 mg/dL (ref 0.4–1.2)
GFR calc non Af Amer: 84.01 mL/min (ref >60)
Glucose, Bld: 175 mg/dL — ABNORMAL HIGH (ref 70–99)
HDL: 65 mg/dL (ref >39.00)
Hgb A1c MFr Bld: 6.6 % — ABNORMAL HIGH (ref 4.6–6.5)
LDL Cholesterol: 53 mg/dL (ref 0–99)
Potassium: 4.5 meq/L (ref 3.5–5.1)
Sodium: 139 meq/L (ref 135–145)
Total Bilirubin: 0.5 mg/dL (ref 0.3–1.2)
Total CHOL/HDL Ratio: 2
Total Protein: 6.1 g/dL (ref 6.0–8.3)
Triglycerides: 115 mg/dL (ref 0.0–149.0)
VLDL: 23 mg/dL (ref 0.0–40.0)

## 2010-05-11 ENCOUNTER — Ambulatory Visit: Payer: Self-pay | Admitting: Family Medicine

## 2010-09-02 ENCOUNTER — Telehealth (INDEPENDENT_AMBULATORY_CARE_PROVIDER_SITE_OTHER): Payer: Self-pay | Admitting: *Deleted

## 2010-10-27 ENCOUNTER — Ambulatory Visit: Payer: Self-pay | Admitting: Family Medicine

## 2010-11-17 ENCOUNTER — Encounter: Payer: Self-pay | Admitting: Family Medicine

## 2010-11-23 ENCOUNTER — Other Ambulatory Visit: Payer: Self-pay | Admitting: Family Medicine

## 2010-11-23 ENCOUNTER — Ambulatory Visit
Admission: RE | Admit: 2010-11-23 | Discharge: 2010-11-23 | Payer: Self-pay | Source: Home / Self Care | Attending: Family Medicine | Admitting: Family Medicine

## 2010-11-23 LAB — CBC WITH DIFFERENTIAL/PLATELET
Basophils Absolute: 0 10*3/uL (ref 0.0–0.1)
Basophils Relative: 0.6 % (ref 0.0–3.0)
Eosinophils Absolute: 0.2 10*3/uL (ref 0.0–0.7)
Eosinophils Relative: 2.4 % (ref 0.0–5.0)
HCT: 39.4 % (ref 36.0–46.0)
Hemoglobin: 13.1 g/dL (ref 12.0–15.0)
Lymphocytes Relative: 16 % (ref 12.0–46.0)
Lymphs Abs: 1.1 10*3/uL (ref 0.7–4.0)
MCHC: 33.3 g/dL (ref 30.0–36.0)
MCV: 84.2 fl (ref 78.0–100.0)
Monocytes Absolute: 0.4 10*3/uL (ref 0.1–1.0)
Monocytes Relative: 5.9 % (ref 3.0–12.0)
Neutro Abs: 5.2 10*3/uL (ref 1.4–7.7)
Neutrophils Relative %: 75.1 % (ref 43.0–77.0)
Platelets: 183 10*3/uL (ref 150.0–400.0)
RBC: 4.68 Mil/uL (ref 3.87–5.11)
RDW: 15.2 % — ABNORMAL HIGH (ref 11.5–14.6)
WBC: 7 10*3/uL (ref 4.5–10.5)

## 2010-11-23 LAB — LIPID PANEL
Cholesterol: 149 mg/dL (ref 0–200)
HDL: 62.2 mg/dL (ref >39.00)
LDL Cholesterol: 62 mg/dL (ref 0–99)
Total CHOL/HDL Ratio: 2
Triglycerides: 125 mg/dL (ref 0.0–149.0)
VLDL: 25 mg/dL (ref 0.0–40.0)

## 2010-11-23 LAB — BASIC METABOLIC PANEL
BUN: 13 mg/dL (ref 6–23)
CO2: 26 mEq/L (ref 19–32)
Calcium: 8.4 mg/dL (ref 8.4–10.5)
Chloride: 104 mEq/L (ref 96–112)
Creatinine, Ser: 0.6 mg/dL (ref 0.4–1.2)
GFR: 119.93 mL/min (ref >60.00)
Glucose, Bld: 130 mg/dL — ABNORMAL HIGH (ref 70–99)
Potassium: 4.6 mEq/L (ref 3.5–5.1)
Sodium: 138 mEq/L (ref 135–145)

## 2010-11-23 LAB — HEPATIC FUNCTION PANEL
ALT: 12 U/L (ref 0–35)
AST: 18 U/L (ref 0–37)
Albumin: 3 g/dL — ABNORMAL LOW (ref 3.5–5.2)
Alkaline Phosphatase: 45 U/L (ref 39–117)
Bilirubin, Direct: 0.1 mg/dL (ref 0.0–0.3)
Total Bilirubin: 0.5 mg/dL (ref 0.3–1.2)
Total Protein: 5.7 g/dL — ABNORMAL LOW (ref 6.0–8.3)

## 2010-11-23 LAB — MICROALBUMIN / CREATININE URINE RATIO
Creatinine,U: 135.4 mg/dL
Microalb Creat Ratio: 1.4 mg/g (ref 0.0–30.0)
Microalb, Ur: 1.9 mg/dL (ref 0.0–1.9)

## 2010-11-23 LAB — TSH: TSH: 3.01 u[IU]/mL (ref 0.35–5.50)

## 2010-11-23 LAB — HEMOGLOBIN A1C: Hgb A1c MFr Bld: 7 % — ABNORMAL HIGH (ref 4.6–6.5)

## 2010-11-23 LAB — CONVERTED CEMR LAB
Bilirubin Urine: NEGATIVE
Blood in Urine, dipstick: NEGATIVE
Glucose, Urine, Semiquant: NEGATIVE
Ketones, urine, test strip: NEGATIVE
Nitrite: POSITIVE
Protein, U semiquant: NEGATIVE
Specific Gravity, Urine: >=1.03
Urobilinogen, UA: 0.2
WBC Urine, dipstick: NEGATIVE
pH: 5

## 2010-11-24 ENCOUNTER — Encounter: Payer: Self-pay | Admitting: Family Medicine

## 2010-11-25 ENCOUNTER — Telehealth (INDEPENDENT_AMBULATORY_CARE_PROVIDER_SITE_OTHER): Payer: Self-pay | Admitting: *Deleted

## 2010-11-30 ENCOUNTER — Ambulatory Visit: Admit: 2010-11-30 | Payer: Self-pay | Admitting: Family Medicine

## 2010-12-01 NOTE — Medication Information (Signed)
Summary: Letter & List from Patient Regarding Lower Cost Meds  Letter & List from Patient Regarding Lower Cost Meds   Imported By: Lanelle Bal 12/20/2009 11:04:58  _____________________________________________________________________  External Attachment:    Type:   Image     Comment:   External Document

## 2010-12-01 NOTE — Assessment & Plan Note (Signed)
Summary: 3 month diabetic check up//ca   Vital Signs:  Patient Profile:   60 Years Old Female Weight:      373.25 pounds Temp:     98.0 degrees F oral Pulse rate:   74 / minute Resp:     18 per minute BP sitting:   128 / 84  (right arm)  Pt. in pain?   no  Vitals Entered By: Ardyth Man (January 15, 2008 11:36 AM)                  Preventive Care Screening     Patient declined colon cancer screening. Aware of risks.  Also reports past due for mammogram. Received letter, but didn't schedule. I provide info on the [facility] center. Patient prefers to schedule herself. Aware of importance of screen.   PCP:  Laury Axon  Chief Complaint:  3 month diabetic check up and Type 2 diabetes mellitus follow-up.  History of Present Illness:  Type 2 Diabetes Mellitus Follow-Up      This is a 60 year old woman who presents for Type 2 diabetes mellitus follow-up.  The patient denies the following symptoms: chest pain.  Since the last visit the patient reports poor dietary compliance, not exercising regularly, and not monitoring blood glucose.   No SOB, DOE,or worsening edema. Sees podiatry regularly.Also seeing [facility]. Reports she has been in a "slump," but is getting herself out of it and feel she can improve her diabetes control. Prefers no changes in medication at this time.  Hyperlipidemia Follow-Up      The patient also presents for Hyperlipidemia follow-up.  Compliance with medications (by patient report) has been near 100%.    Hypertension Follow-Up      The patient also presents for Hypertension follow-up.  Compliance with medications (by patient report) has been near 100%.      Current Allergies: ! PCN ! VICODIN ! OXYCODONE HCL  Past Medical History:    Reviewed history from 12/14/2006 and no changes required:       Depression       Diabetes mellitus, type II       GERD       Hyperlipidemia       Hypertension     Review of Systems  The patient denies chest pain and  dyspnea on exhertion.     Physical Exam  General:     well-hydrated and overweight-appearing.   Lungs:     Normal respiratory effort, chest expands symmetrically. Lungs are clear to auscultation, no crackles or wheezes. Heart:     Normal rate and regular rhythm. S1 and S2 normal without gallop, murmur, click, rub or other extra sounds.    Impression & Recommendations:  Problem # 1:  HYPERTENSION (ICD-401.9) Borderline today We will continue to monitor closely Recheck at next visit Her updated medication list for this problem includes:    Furosemide 40 Mg Tabs (Furosemide)    Quinapril Hcl 20 Mg Tabs (Quinapril hcl)  Orders: TLB-BMP (Basic Metabolic Panel-BMET) (80048-METABOL)  BP today: 128/84 Prior BP: 120/80 (07/31/2007)  Labs Reviewed: Creat: 0.6 (03/02/2007) Chol: 164 (07/04/2007)   HDL: 55.5 (07/04/2007)   LDL: 81 (07/04/2007)   TG: 140 (07/04/2007)   Problem # 2:  HYPERLIPIDEMIA (ICD-272.4) Previously at goal Her updated medication list for this problem includes:    Vytorin 10-40 Mg Tabs (Ezetimibe-simvastatin)  Orders: TLB-ALT (SGPT) (84460-ALT) TLB-AST (SGOT) (84450-SGOT)  Labs Reviewed: Chol: 164 (07/04/2007)   HDL: 55.5 (07/04/2007)   LDL: 81 (  07/04/2007)   TG: 140 (07/04/2007) SGOT: 26 (07/04/2007)   SGPT: 17 (07/04/2007)   Problem # 3:  DIABETES MELLITUS, TYPE II (ICD-250.00) Check  labs today Patient prefers to try lifestyle changes prior to instuting changes in medications Agree to adjustment if necessary by next visit. Her updated medication list for this problem includes:    Glipizide 5 Mg Tb24 (Glipizide)    Quinapril Hcl 20 Mg Tabs (Quinapril hcl)    Avandia 4 Mg Tabs (Rosiglitazone maleate)    Metformin Hcl 1000 Mg Tabs (Metformin hcl)  Orders: TLB-Lipid Panel (80061-LIPID) TLB-A1C / Hgb A1C (Glycohemoglobin) (83036-A1C) TLB-Microalbumin/Creat Ratio, Urine (82043-MALB)  Labs Reviewed: HgBA1c: 7.0 (07/04/2007)   Creat: 0.6  (03/02/2007)      Complete Medication List: 1)  Furosemide 40 Mg Tabs (Furosemide) 2)  Glipizide 5 Mg Tb24 (Glipizide) 3)  Menest 2.5 Mg Tabs (Esterified estrogens) .... Take one tablet every other day 4)  Fluoxetine Hcl 20 Mg Caps (Fluoxetine hcl) .... Take one tablet daily 5)  Nexium 40 Mg Cpdr (Esomeprazole magnesium) 6)  Quinapril Hcl 20 Mg Tabs (Quinapril hcl) 7)  Vytorin 10-40 Mg Tabs (Ezetimibe-simvastatin) 8)  Avandia 4 Mg Tabs (Rosiglitazone maleate) 9)  Metformin Hcl 1000 Mg Tabs (Metformin hcl) 10)  Fluoxetine Hcl 40 Mg Caps (Fluoxetine hcl)     ]

## 2010-12-01 NOTE — Progress Notes (Signed)
Summary: vytorin rx  Phone Note Call from Patient   Caller: Patient Summary of Call: out of vytorin mail order did not come in [facility] [address] Initial call taken by: Kandice Hams,  October 21, 2008 12:55 PM      Prescriptions: VYTORIN 10-40 MG TABS (EZETIMIBE-SIMVASTATIN)   #30 x 0   Entered by:   Kandice Hams   Authorized by:   Marga Melnick MD   Signed by:   Kandice Hams on 10/21/2008   Method used:   [facility] ...       [facility] [address] [phone]* (retail)       [address]       [place], [place]  [address]       Ph: [phone] or [phone]       Fax: [phone]   RxID:   [id number]

## 2010-12-01 NOTE — Assessment & Plan Note (Signed)
Summary: DIARRHEA W/CRAMPING,OK PER DR LOWNE,NS FEE/RH.....   Vital Signs:  Patient profile:   60 year old female Weight:      352.50 pounds Temp:     98.1 degrees F oral Pulse rate:   82 / minute Pulse rhythm:   regular BP sitting:   138 / 80  (left arm) Cuff size:   regular  Vitals Entered By: Army Fossa CMA (August 06, 2009 10:42 AM) CC: diarrhea x 1 week. has tried immodium and tylenol. Having some stomach cramping. , Diarrhea   History of Present Illness:  Diarrhea      This is a 60 year old woman who presents with Diarrhea.  The symptoms began 1 week ago.  The patient complains of 4-6 stools per day and watery/unformed stools.  Associated symptoms include abdominal cramps.  The patient denies fever, abdominal pain, nausea, vomiting, lightheadedness, increased thirst, weight loss, joint pains, mouth ulcers, and eye redness.  The symptoms are better with fasting.  Patient's risk factors for diarrhea include sick contact.    Current Medications (verified): 1)  Furosemide 40 Mg Tabs (Furosemide) .... Take 1 Tab Once Daily 2)  Glipizide 5 Mg Tb24 (Glipizide) .... Take 1 Once Daily 3)  Menest 2.5 Mg Tabs (Esterified Estrogens) .... Take One Tablet Every Other Day 4)  Nexium 40 Mg Cpdr (Esomeprazole Magnesium) .... Take 1 Once Daily 5)  Quinapril Hcl 20 Mg Tabs (Quinapril Hcl) .... Take 1 Tab Once Daily 6)  Vytorin 10-40 Mg Tabs (Ezetimibe-Simvastatin) .... Take 1 Tab Once Daily 7)  Metformin Hcl 1000 Mg Tabs (Metformin Hcl) .... Take 1 Tab Two Times A Day 8)  Tylenol Arthritis Pain 650 Mg Cr-Tabs (Acetaminophen) .... Take 1 Tab Two Times A Day As Needed 9)  Prozac 20 Mg Caps (Fluoxetine Hcl) .... 3 By Mouth Once Daily 10)  Freestyle Lite Test  Strp (Glucose Blood) .... Use One Strip Twice Daily. 11)  Freestyle Unistick Ii Lancets  Misc (Lancets) .... Use One Lancet Twice Daily. 12)  Victoza 18 Mg/42ml Soln (Liraglutide) .... Administer 1.2 Ml Daily 13)  Novofine 32g X 6 Mm  Misc (Insulin Pen Needle) .... As Directed 14)  Levsin/sl 0.125 Mg Subl (Hyoscyamine Sulfate) .Marland Kitchen.. 1-2 Sl Q4h As Needed  Allergies: 1)  ! Pcn 2)  ! Vicodin 3)  ! Oxycodone Hcl  Past History:  Past medical, surgical, family and social histories (including risk factors) reviewed, and no changes noted (except as noted below).  Past Medical History: Reviewed history from 12/14/2006 and no changes required. Depression Diabetes mellitus, type II GERD Hyperlipidemia Hypertension  Family History: Reviewed history and no changes required.  Social History: Reviewed history and no changes required.  Review of Systems      See HPI  Physical Exam  General:  alert and overweight-appearing.   Mouth:  Oral mucosa and oropharynx without lesions or exudates.  Teeth in good repair. Lungs:  Normal respiratory effort, chest expands symmetrically. Lungs are clear to auscultation, no crackles or wheezes. Heart:  normal rate and Grade  2 /6 systolic ejection murmur.   Abdomen:  Bowel sounds positive,abdomen soft and non-tender without masses, organomegaly or hernias noted. Psych:  Oriented X3 and normally interactive.     Impression & Recommendations:  Problem # 1:  DIARRHEA (ICD-787.91) stool cards  Orders: Venipuncture (29528) TLB-BMP (Basic Metabolic Panel-BMET) (80048-METABOL) TLB-CBC Platelet - w/Differential (85025-CBCD) TLB-Hepatic/Liver Function Pnl (80076-HEPATIC) TLB-TSH (Thyroid Stimulating Hormone) (84443-TSH) T-Culture, Stool (87045/87046-70140) T-Culture, C-Diff Toxin A/B (41324-40102)  Discussed symptom control and diet. Call if worsening of symptoms or signs of dehydration.   Complete Medication List: 1)  Furosemide 40 Mg Tabs (Furosemide) .... Take 1 tab once daily 2)  Glipizide 5 Mg Tb24 (Glipizide) .... Take 1 once daily 3)  Menest 2.5 Mg Tabs (Esterified estrogens) .... Take one tablet every other day 4)  Nexium 40 Mg Cpdr (Esomeprazole magnesium) .... Take 1  once daily 5)  Quinapril Hcl 20 Mg Tabs (Quinapril hcl) .... Take 1 tab once daily 6)  Vytorin 10-40 Mg Tabs (Ezetimibe-simvastatin) .... Take 1 tab once daily 7)  Metformin Hcl 1000 Mg Tabs (Metformin hcl) .... Take 1 tab two times a day 8)  Tylenol Arthritis Pain 650 Mg Cr-tabs (Acetaminophen) .... Take 1 tab two times a day as needed 9)  Prozac 20 Mg Caps (Fluoxetine hcl) .... 3 by mouth once daily 10)  Freestyle Lite Test Strp (Glucose blood) .... Use one strip twice daily. 11)  Freestyle Unistick Ii Lancets Misc (Lancets) .... Use one lancet twice daily. 12)  Victoza 18 Mg/42ml Soln (Liraglutide) .... Administer 1.2 ml daily 13)  Novofine 32g X 6 Mm Misc (Insulin pen needle) .... As directed 14)  Levsin/sl 0.125 Mg Subl (Hyoscyamine sulfate) .Marland Kitchen.. 1-2 sl q4h as needed Prescriptions: LEVSIN/SL 0.125 MG SUBL (HYOSCYAMINE SULFATE) 1-2 SL q4h as needed  #30 x 0   Entered and Authorized by:   Loreen Freud DO   Signed by:   Loreen Freud DO on 08/06/2009   Method used:   [facility]        [facility] [address] [phone]* (retail)       [address]       [place], [place]  [address]       Ph: [phone] or [phone]       Fax: [phone]   RxID:   [id number]

## 2010-12-01 NOTE — Assessment & Plan Note (Signed)
Summary: ROV 3 MONTHS.CBS   Vital Signs:  Patient Profile:   60 Years Old Female Weight:      368 pounds Temp:     98.6 degrees F oral Pulse rate:   86 / minute Resp:     18 per minute BP sitting:   128 / 80  (left arm)  Pt. in pain?   no  Vitals Entered By: Ardyth Man (July 04, 2007 10:16 AM)                  PCP:  Laury Axon  Chief Complaint:  3 month follow up for medication, diabetes, and Type 2 diabetes mellitus follow-up.  History of Present Illness:  Type 2 Diabetes Mellitus Follow-Up      This is a 60 year old woman who presents for Type 2 diabetes mellitus follow-up.  The patient denies polyuria, polydipsia, blurred vision, and self managed hypoglycemia.  The patient denies the following symptoms: chest pain.  Since the last visit the patient reports good dietary compliance.  Seen by Podiatry last week.  Patient admits she has been less than "perfect" about her diabetes. Has not been monitoring her blood sugar. Has been under a lot of stress.  Hypertension Follow-Up      The patient also presents for Hypertension follow-up.  The patient denies the following associated symptoms: chest pain.  Compliance with medications (by patient report) has been near 100%.    Hyperlipidemia Follow-Up      The patient also presents for Hyperlipidemia follow-up.  Compliance with medications (by patient report) has been near 100%.    Current Allergies: ! PCN ! VICODIN ! OXYCODONE HCL      Physical Exam  General:     Well-developed,well-nourished,in no acute distress; alert,appropriate and cooperative throughout examination: obese Neck:     No deformities, masses, or tenderness noted. Lungs:     Normal respiratory effort, chest expands symmetrically. Lungs are clear to auscultation, no crackles or wheezes. Heart:     Normal rate and regular rhythm. S1 and S2 normal without gallop, murmur, click, rub or other extra sounds.  Diabetes Management Exam:    Foot Exam  (with socks and/or shoes not present):       Sensory-Pinprick/Light touch:          Left medial foot (L-4): normal          Left dorsal foot (L-5): normal          Left lateral foot (S-1): normal          Right medial foot (L-4): normal          Right dorsal foot (L-5): normal          Right lateral foot (S-1): normal       Sensory-Monofilament:          Left foot: normal          Right foot: normal       Inspection:          Left foot: normal          Right foot: normal       Nails:          Left foot: normal          Right foot: normal    Impression & Recommendations:  Problem # 1:  DIABETES MELLITUS, TYPE II (ICD-250.00) based on history today, her diabetes may not be well controlled. Further recommendation after laboratory review. Encourage patient to focus  on herself regarding her diabetes and lifestyle. follow-up in 3 months or sooner if needed. Her updated medication list for this problem includes:    Glipizide 5 Mg Tb24 (Glipizide)    Quinapril Hcl 20 Mg Tabs (Quinapril hcl)    Avandia 4 Mg Tabs (Rosiglitazone maleate)    Metformin Hcl 1000 Mg Tabs (Metformin hcl)  Orders: TLB-A1C / Hgb A1C (Glycohemoglobin) (83036-A1C)   Problem # 2:  HYPERTENSION (ICD-401.9) stable Her updated medication list for this problem includes:    Furosemide 40 Mg Tabs (Furosemide)    Quinapril Hcl 20 Mg Tabs (Quinapril hcl)  BP today: 128/80 Prior BP: 134/84 (04/19/2007)  Labs Reviewed: Creat: 0.6 (03/02/2007) Chol: 140 (11/15/2006)   HDL: 54.8 (11/15/2006)   LDL: 60 (11/15/2006)   TG: 127 (11/15/2006)   Problem # 3:  HYPERLIPIDEMIA (ICD-272.4) previously at goal Her updated medication list for this problem includes:    Vytorin 10-40 Mg Tabs (Ezetimibe-simvastatin)  Orders: TLB-Lipid Panel (80061-LIPID) TLB-ALT (SGPT) (84460-ALT) TLB-AST (SGOT) (84450-SGOT)  Labs Reviewed: Chol: 140 (11/15/2006)   HDL: 54.8 (11/15/2006)   LDL: 60 (11/15/2006)   TG: 127  (11/15/2006) SGOT: 18 (03/02/2007)   SGPT: 14 (03/02/2007)   Complete Medication List: 1)  Furosemide 40 Mg Tabs (Furosemide) 2)  Glipizide 5 Mg Tb24 (Glipizide) 3)  Menest 2.5 Mg Tabs (Esterified estrogens) 4)  Fluoxetine Hcl 20 Mg Caps (Fluoxetine hcl) 5)  Nexium 40 Mg Cpdr (Esomeprazole magnesium) 6)  Quinapril Hcl 20 Mg Tabs (Quinapril hcl) 7)  Vytorin 10-40 Mg Tabs (Ezetimibe-simvastatin) 8)  Avandia 4 Mg Tabs (Rosiglitazone maleate) 9)  Metformin Hcl 1000 Mg Tabs (Metformin hcl) 10)  Fluoxetine Hcl 40 Mg Caps (Fluoxetine hcl)

## 2010-12-01 NOTE — Progress Notes (Signed)
Summary: lowne--rx fluoxetine  Phone Note Refill Request   Refills Requested: Medication #1:  FLUOXETINE HCL 20 MG CAPS Take one tablet daily [facility] [address]--[phone]--lst filled--12.29.08  Initial call taken by: Freddy Jaksch,  December 02, 2008 3:29 PM  Follow-up for Phone Call        last filled #90 09/25/07, lpt was scheduled for appt 11/29/08 now has appt scheduled 12/04/08 Wait until ov? Follow-up by: Kandice Hams,  December 02, 2008 4:47 PM  Additional Follow-up for Phone Call Additional follow up Details #1::        yes Additional Follow-up by: Loreen Freud DO,  December 03, 2008 8:26 AM

## 2010-12-01 NOTE — Progress Notes (Signed)
Summary: [facility] PRESCRIPTION LIST  Phone Note Call from Patient Call back at Home Phone [phone]   Caller: Patient Summary of Call: PATIENT BROUGHT IN [facility] LIST REGARDING HER CURRENT MEDICATIONS LIST---SHE NEEDS DR LOWNE TO REVIEW THE LIST AND SEE IF SHE CAN SWITCH OVER TO ANY ON THE LIST  IF SO, PLEASE USE BLANK [facility] PRESCRIPTION FORM TO FAX IN NEW PRESCRIPTIONS ----PLEASE CALL PATIENT TO TELL HER WHEN NEW PRESCRIPTIONS HAVE BEEN SENT  WILL TAKE TO [facility] IN [facility] Initial call taken by: Jerolyn Shin,  December 12, 2009 3:37 PM  Follow-up for Phone Call        alternatives were sent to [facility]---except strips---does pt know for sure they can be used in her machine?  She needs to check that.   pt needs ov 2-3 weeks to check bp to make sure bp med is at appropriate dose. Follow-up by: Loreen Freud DO,  December 15, 2009 12:45 PM  Additional Follow-up for Phone Call Additional follow up Details #1::        Pt is aware- she will make appt. Army Fossa CMA  December 15, 2009 2:40 PM     New/Updated Medications: GLIMEPIRIDE 2 MG TABS (GLIMEPIRIDE) 1 by mouth once daily RANITIDINE HCL 300 MG CAPS (RANITIDINE HCL) 1 by mouth once daily BENAZEPRIL HCL 20 MG TABS (BENAZEPRIL HCL) 1 by mouth qd Prescriptions: RANITIDINE HCL 300 MG CAPS (RANITIDINE HCL) 1 by mouth once daily  #90 x 3   Entered and Authorized by:   Loreen Freud DO   Signed by:   Loreen Freud DO on 12/15/2009   Method used:   Electronically to        [facility]* (mail-order)             ,          Ph: [phone]       Fax: [phone]   RxID:   [id number] GLIMEPIRIDE 2 MG TABS (GLIMEPIRIDE) 1 by mouth once daily  #90 x 3   Entered and Authorized by:   Loreen Freud DO   Signed by:   Loreen Freud DO on 12/15/2009   Method used:   Electronically to        [facility]* (mail-order)             ,          Ph: [phone]       Fax: [phone]   RxID:   [id number] BENAZEPRIL HCL 20  MG TABS (BENAZEPRIL HCL) 1 by mouth qd  #90 x 3   Entered and Authorized by:   Loreen Freud DO   Signed by:   Loreen Freud DO on 12/15/2009   Method used:   Electronically to        [facility]* (mail-order)             ,          Ph: [phone]       Fax: [phone]   RxID:   [id number]

## 2010-12-01 NOTE — Letter (Signed)
Summary: Results Follow-up Letter  [facility]  [address] [place], [place] [address]   Phone: [phone]  Fax: [phone]    12/27/2008        Andrea Marks [address] [place], [place]  [address]  Dear [name],   The following are the results of your recent test(s):  Test     Result     Pap Smear    Normal_______  Not Normal_____       Comments: _________________________________________________________ Cholesterol LDL(Bad cholesterol):          Your goal is less than:         HDL (Good cholesterol):        Your goal is more than: _________________________________________________________ Other Tests:   _________________________________________________________  Please call for an appointment Or Please see attached labs._________________________________________________________ _________________________________________________________ _________________________________________________________  Sincerely,  Felecia Deloach CMA [facility]

## 2010-12-01 NOTE — Medication Information (Signed)
Summary: Letter Regarding Fluoxetine/[facility]  Letter Regarding Fluoxetine/[facility]   Imported By: Lanelle Bal 09/24/2009 11:22:09  _____________________________________________________________________  External Attachment:    Type:   Image     Comment:   External Document

## 2010-12-01 NOTE — Progress Notes (Signed)
Summary: rx refaxed  Phone Note Call from Patient   Caller: Patient Summary of Call: PT CALLED [facility] NEVER SENT HER RX FOR GLIPIZIDE AND SHE NEEDS RX FOR VYTORIN. Rx refaxed to [facility] Initial call taken by: Kandice Hams,  January 24, 2009 10:49 AM      Prescriptions: VYTORIN 10-40 MG TABS (EZETIMIBE-SIMVASTATIN) take 1 tab once daily  #90 x 1   Entered by:   Kandice Hams   Authorized by:   Loreen Freud DO   Signed by:   Kandice Hams on 01/24/2009   Method used:   Re-Faxed to ...       [facility]* (mail-order)             ,          Ph: [phone]       Fax: [phone]   RxID:   [id number] GLIPIZIDE 5 MG TB24 (GLIPIZIDE) take 1 once daily  #90 x 3   Entered by:   Kandice Hams   Authorized by:   Loreen Freud DO   Signed by:   Kandice Hams on 01/24/2009   Method used:   Re-Faxed to ...       [facility]* (mail-order)             ,          Ph: [phone]       Fax: [phone]   RxID:   [id number]

## 2010-12-01 NOTE — Progress Notes (Signed)
Summary: LMTC-8-2-10QUESTIONS REF TO BLOOD SUGARS  Phone Note Call from Patient Call back at Home Phone [phone]   Caller: Patient Summary of Call: PT CALLED IN REF TO HER [facility], SHE GOT THE MED BUT NOT THE NEEDLES NEED SENT TO [facility] ([facility] THE GAUGE NEEDLE COMPARABLE TO [facility] AND RX FAXED TO [facility]) ALSO PT SAYS SHE CHECKS HER BS  1X IN AM FASTING, THEN 2 HOURS AFTER EATING NEED TO KNOW WHAT RANGE HER BS SHOULD BE IN PLEASE CALL.  Initial call taken by: Kandice Hams,  June 02, 2009 10:52 AM  Follow-up for Phone Call        dr Laury Axon please advise on bs range...........Marland KitchenFelecia Deloach CMA  June 02, 2009 11:00 AM  Additional Follow-up for Phone Call Additional follow up Details #1::        needles already sent by alida--- looking for fasting to be as close to 100 as possible and 2 hr PP-- 120s  Additional Follow-up by: Loreen Freud DO,  June 02, 2009 12:19 PM    Additional Follow-up for Phone Call Additional follow up Details #2::    left message to call  office.Felecia Deloach CMA  June 02, 2009 1:23 PM  pt aware......................Marland KitchenFelecia Deloach CMA  June 03, 2009 1:21 PM  New/Updated Medications: NOVOFINE 32G X 6 MM MISC (INSULIN PEN NEEDLE) TEST two times a day Prescriptions: NOVOFINE 32G X 6 MM MISC (INSULIN PEN NEEDLE) TEST two times a day  #300 x 1   Entered by:   Kandice Hams   Authorized by:   Loreen Freud DO   Signed by:   Kandice Hams on 06/02/2009   Method used:   Faxed to ...       [facility]* (mail-order)             ,          Ph: [phone]       Fax: [phone]   RxID:   [id number]

## 2010-12-01 NOTE — Progress Notes (Signed)
Summary: Lab Results   Phone Note Outgoing Call Call back at Home Phone [phone]   Call placed by: Shonna Chock,  April 28, 2010 1:53 PM Call placed to: Patient Summary of Call: Left message with female for patient to return call when avaliable, Reason for call:  glucose elvated----try to increase victoza to 1.8 daily cholesterol is great! recheck 3 months-----hgba1c, bmp, lipid, hep, microalbumin 272.4  250.00   Andrea Marks  April 28, 2010 1:54 PM  DISCUSS WITH PATIENT..............Marland KitchenFelecia Deloach CMA  April 28, 2010 2:44 PM

## 2010-12-01 NOTE — Assessment & Plan Note (Signed)
Summary: FU ON LABS/NS/KDC   Vital Signs:  Patient profile:   60 year old female Weight:      339 pounds Temp:     98.2 degrees F oral Pulse rate:   86 / minute Pulse rhythm:   regular BP sitting:   132 / 78  (left arm) Cuff size:   large  Vitals Entered By: Army Fossa CMA (November 10, 2009 12:38 PM) CC: Follow up on labs    History of Present Illness:  Type 1 diabetes mellitus follow-up      This is a 60 year old woman who presents with Type 2 diabetes mellitus follow-up.  The patient denies polyuria, polydipsia, blurred vision, self managed hypoglycemia, hypoglycemia requiring help, weight loss, weight gain, and numbness of extremities.  The patient denies the following symptoms: neuropathic pain, chest pain, vomiting, orthostatic symptoms, poor wound healing, intermittent claudication, vision loss, and foot ulcer.  Since the last visit the patient reports good dietary compliance, compliance with medications, not exercising regularly, and not monitoring blood glucose.  Since the last visit, the patient reports having had eye care by an ophthalmologist and foot care by a podiatrist.    Hyperlipidemia follow-up      The patient also presents for Hyperlipidemia follow-up.  The patient denies muscle aches, GI upset, abdominal pain, flushing, itching, constipation, diarrhea, and fatigue.  The patient denies the following symptoms: chest pain/pressure, exercise intolerance, dypsnea, palpitations, syncope, and pedal edema.  Compliance with medications (by patient report) has been near 100%.  Dietary compliance has been excellent.  The patient reports no exercise.    Hypertension follow-up      The patient also presents for Hypertension follow-up.  The patient denies lightheadedness, urinary frequency, headaches, edema, impotence, rash, and fatigue.  The patient denies the following associated symptoms: chest pain, chest pressure, exercise intolerance, dyspnea, palpitations, syncope, leg  edema, and pedal edema.  Compliance with medications (by patient report) has been near 100%.  The patient reports that dietary compliance has been excellent.  The patient reports no exercise.  Adjunctive measures currently used by the patient include salt restriction.    Preventive Screening-Counseling & Management  Alcohol-Tobacco     Smoking Status: quit  Caffeine-Diet-Exercise     Does Patient Exercise: no      Drug Use:  no.    Current Medications (verified): 1)  Glipizide 5 Mg Tb24 (Glipizide) .... Take 1 Once Daily 2)  Menest 2.5 Mg Tabs (Esterified Estrogens) .... Take One Tablet Every Other Day 3)  Nexium 40 Mg Cpdr (Esomeprazole Magnesium) .... Take 1 Once Daily 4)  Quinapril Hcl 20 Mg Tabs (Quinapril Hcl) .... Take 1 Tab Once Daily 5)  Vytorin 10-40 Mg Tabs (Ezetimibe-Simvastatin) .... Take 1 Tab Once Daily 6)  Metformin Hcl 1000 Mg Tabs (Metformin Hcl) .... Take 1 Tab Two Times A Day 7)  Tylenol Arthritis Pain 650 Mg Cr-Tabs (Acetaminophen) .... Take 1 Tab Two Times A Day As Needed 8)  Prozac 20 Mg Caps (Fluoxetine Hcl) .... 3 By Mouth Once Daily 9)  Freestyle Lite Test  Strp (Glucose Blood) .... Use One Strip Twice Daily. 10)  Freestyle Unistick Ii Lancets  Misc (Lancets) .... Use One Lancet Twice Daily. 11)  Victoza 18 Mg/25ml Soln (Liraglutide) .... Administer 1.2 Ml Daily 12)  Novofine 32g X 6 Mm Misc (Insulin Pen Needle) .... As Directed  Allergies: 1)  ! Pcn 2)  ! Vicodin 3)  ! Oxycodone Hcl  Past History:  Past medical,  surgical, family and social histories (including risk factors) reviewed for relevance to current acute and chronic problems.  Past Medical History: Reviewed history from 12/14/2006 and no changes required. Depression Diabetes mellitus, type II GERD Hyperlipidemia Hypertension  Family History: Reviewed history and no changes required. M--Macular degeneration Family History Lung cancer-- asbestos Family History Diabetes 1st degree  relative Family History Weight disorder  Social History: Reviewed history and no changes required. Married Former Smoker Alcohol use-no Drug use-no Regular exercise-no Smoking Status:  quit Drug Use:  no Does Patient Exercise:  no  Review of Systems      See HPI  Physical Exam  General:  Well-developed,well-nourished,in no acute distress; alert,appropriate and cooperative throughout examinationoverweight-appearing.   Eyes:  optho 08/2009 Ears:  External ear exam shows no significant lesions or deformities.  Otoscopic examination reveals clear canals, tympanic membranes are intact bilaterally without bulging, retraction, inflammation or discharge. Hearing is grossly normal bilaterally. Neck:  No deformities, masses, or tenderness noted. Lungs:  Normal respiratory effort, chest expands symmetrically. Lungs are clear to auscultation, no crackles or wheezes. Heart:  normal rate and no murmur.   Extremities:  Pt feet checked by podiatrist every other month Skin:  Intact without suspicious lesions or rashes Cervical Nodes:  No lymphadenopathy noted Psych:  Oriented X3 and normally interactive.     Impression & Recommendations:  Problem # 1:  HYPERTENSION (ICD-401.9)  The following medications were removed from the medication list:    Furosemide 40 Mg Tabs (Furosemide) .Marland Kitchen... Take 1 tab once daily Her updated medication list for this problem includes:    Quinapril Hcl 20 Mg Tabs (Quinapril hcl) .Marland Kitchen... Take 1 tab once daily  BP today: 132/78 Prior BP: 138/80 (08/06/2009)  Labs Reviewed: K+: 4.7 (08/06/2009) Creat: : 0.7 (08/06/2009)   Chol: 138 (05/12/2009)   HDL: 53.60 (05/12/2009)   LDL: 61 (05/12/2009)   TG: 118.0 (05/12/2009)  Problem # 2:  HYPERLIPIDEMIA (ICD-272.4)  Her updated medication list for this problem includes:    Vytorin 10-40 Mg Tabs (Ezetimibe-simvastatin) .Marland Kitchen... Take 1 tab once daily  Labs Reviewed: SGOT: 17 (08/06/2009)   SGPT: 15 (08/06/2009)    HDL:53.60 (05/12/2009), 53.7 (12/20/2008)  LDL:61 (05/12/2009), 62 (12/20/2008)  Chol:138 (05/12/2009), 139 (12/20/2008)  Trig:118.0 (05/12/2009), 115 (12/20/2008)  Problem # 3:  DIABETES MELLITUS, TYPE II (ICD-250.00)  Her updated medication list for this problem includes:    Glipizide 5 Mg Tb24 (Glipizide) .Marland Kitchen... Take 1 once daily    Quinapril Hcl 20 Mg Tabs (Quinapril hcl) .Marland Kitchen... Take 1 tab once daily    Metformin Hcl 1000 Mg Tabs (Metformin hcl) .Marland Kitchen... Take 1 tab two times a day    Victoza 18 Mg/62ml Soln (Liraglutide) .Marland Kitchen... Administer 1.2 ml daily  Labs Reviewed: Creat: 0.7 (08/06/2009)    Reviewed HgBA1c results: 6.3 (05/12/2009)  6.5 (12/20/2008)  Complete Medication List: 1)  Glipizide 5 Mg Tb24 (Glipizide) .... Take 1 once daily 2)  Menest 2.5 Mg Tabs (Esterified estrogens) .... Take one tablet every other day 3)  Nexium 40 Mg Cpdr (Esomeprazole magnesium) .... Take 1 once daily 4)  Quinapril Hcl 20 Mg Tabs (Quinapril hcl) .... Take 1 tab once daily 5)  Vytorin 10-40 Mg Tabs (Ezetimibe-simvastatin) .... Take 1 tab once daily 6)  Metformin Hcl 1000 Mg Tabs (Metformin hcl) .... Take 1 tab two times a day 7)  Tylenol Arthritis Pain 650 Mg Cr-tabs (Acetaminophen) .... Take 1 tab two times a day as needed 8)  Prozac 20 Mg Caps (Fluoxetine hcl) .Marland KitchenMarland KitchenMarland Kitchen  3 by mouth once daily 9)  Freestyle Lite Test Strp (Glucose blood) .... Use one strip twice daily. 10)  Freestyle Unistick Ii Lancets Misc (Lancets) .... Use one lancet twice daily. 11)  Victoza 18 Mg/78ml Soln (Liraglutide) .... Administer 1.2 ml daily 12)  Novofine 32g X 6 Mm Misc (Insulin pen needle) .... As directed  Other Orders: Pneumococcal Vaccine ([address]) Admin 1st Vaccine (60[address])  Patient Instructions: 1)  rto 6 month----fasting labs  250.00, 272.4   hgba1c, microalbumin,  bmp, hep, lipid  ---  can do labs 2 weeks before ov  Prescriptions: PROZAC 20 MG CAPS (FLUOXETINE HCL) 3 by mouth once daily  #270 x 3   Entered and  Authorized by:   Loreen Freud DO   Signed by:   Loreen Freud DO on 11/10/2009   Method used:   Electronically to        [facility]* (mail-order)             ,          Ph: [phone]       Fax: [phone]   RxID:   [id number] METFORMIN HCL 1000 MG TABS (METFORMIN HCL) take 1 tab two times a day  #180 x 3   Entered and Authorized by:   Loreen Freud DO   Signed by:   Loreen Freud DO on 11/10/2009   Method used:   Electronically to        [facility]* (mail-order)             ,          Ph: [phone]       Fax: [phone]   RxID:   [id number]    Immunization History:  Tetanus/Td Immunization History:    Tetanus/Td:  Historical (05/08/2003)  Immunizations Administered:  Pneumonia Vaccine:    Vaccine Type: Pneumovax    Site: right deltoid    Mfr: Merck    Dose: 0.5 ml    Route: IM    Given by: Army Fossa CMA    Exp. Date: 12/02/2010    Lot #: [id number]   Flu Vaccine Next Due:  Refused     Immunization History:  Tetanus/Td Immunization History:    Tetanus/Td:  historical (05/08/2003)  Immunizations Administered:  Pneumonia Vaccine:    Vaccine Type: Pneumovax    Site: right deltoid    Mfr: Merck    Dose: 0.5 ml    Route: IM    Given by: Army Fossa CMA    Exp. Date: 12/02/2010    Lot #: [id number]

## 2010-12-01 NOTE — Progress Notes (Signed)
  Phone Note Refill Request   Refills Requested: Medication #1:  VYTORIN 10-40 MG TABS take 1 tab once daily  Medication #2:  MENEST 2.5 MG TABS Take one tablet every other day    Prescriptions: VYTORIN 10-40 MG TABS (EZETIMIBE-SIMVASTATIN) take 1 tab once daily  #90 x 1   Entered by:   Army Fossa CMA   Authorized by:   Loreen Freud DO   Signed by:   Army Fossa CMA on 12/26/2009   Method used:   Electronically to        [facility] (mail-order)             ,          Ph: [phone]       Fax: [phone]   RxID:   [id number] MENEST 2.5 MG TABS (ESTERIFIED ESTROGENS) Take one tablet every other day  #45 x 1   Entered by:   Army Fossa CMA   Authorized by:   Loreen Freud DO   Signed by:   Army Fossa CMA on 12/26/2009   Method used:   Electronically to        [facility]* (mail-order)             ,          Ph: [phone]       Fax: [phone]   RxID:   [id number]

## 2010-12-01 NOTE — Medication Information (Signed)
Summary: Therapy Considerations w/Fluoxetine/[facility]  Therapy Considerations w/Fluoxetine/[facility]   Imported By: Lanelle Bal 04/30/2009 14:03:07  _____________________________________________________________________  External Attachment:    Type:   Image     Comment:   External Document

## 2010-12-01 NOTE — Progress Notes (Signed)
Summary: appt scheduled 012910  Phone Note Call from Patient Call back at Home Phone [phone]   Caller: Patient Summary of Call: rx for  FLUOXETINE HCL 20mg  was refilled  thru [facility] -patient takes 60 mg she said she usually gets rx for 20 mg & 40 mg needs rx faxed [facility] for FLUOXETINE HCL 40mg  ------also  [facility] [address] Initial call taken by: Okey Regal [place],  November 25, 2008 11:03 AM  Follow-up for Phone Call        patient hasnt been seen since 098119 --- appt scheduled 012910 Follow-up by: Okey Regal Spring,  November 26, 2008 9:31 AM

## 2010-12-01 NOTE — Progress Notes (Signed)
Summary: ALEJANDRO-REFILL FUROSEMIDE 40MG   Phone Note Refill Request   Refills Requested: Medication #1:  FUROSEMIDE 40 MG TABS TAKE ONE TABLET DAILY RECD BY FAX [facility] [phone] ***FAX TO FOLLOW***  Initial call taken by: Gwen Pounds,  October 10, 2007 2:42 PM  Follow-up for Phone Call        Rx sent electronically ...................................................................Ardyth Man  October 11, 2007 8:36 AM  Follow-up by: Ardyth Man,  October 11, 2007 8:36 AM

## 2010-12-01 NOTE — Medication Information (Signed)
Summary: Letter Regarding Fluoxetine/[facility]  Letter Regarding Fluoxetine/[facility]   Imported By: Lanelle Bal 02/06/2010 11:19:14  _____________________________________________________________________  External Attachment:    Type:   Image     Comment:   External Document

## 2010-12-01 NOTE — Progress Notes (Signed)
  Phone Note Outgoing Call   Call placed by: Ardyth Man,  August 01, 2007 8:02 AM Call placed to: Patient Summary of Call: Pt. aware ...................................................................Ardyth Man  August 01, 2007 8:02 AM

## 2010-12-01 NOTE — Progress Notes (Signed)
Summary: [facility]--rx  Phone Note Refill Request   Refills Requested: Medication #1:  VYTORIN 10-40 MG TABS  Medication #2:  GLIPIZIDE 5 MG TB24  Medication #3:  QUINAPRIL HCL 20 MG TABS  Medication #4:  FUROSEMIDE 40 MG TABS [facility]-[phone]  Initial call taken by: Freddy Jaksch,  October 18, 2008 8:50 AM  Follow-up for Phone Call        duplicate faxed 10/16/08 Follow-up by: Kandice Hams,  October 18, 2008 11:43 AM

## 2010-12-01 NOTE — Progress Notes (Signed)
Summary: [facility]--rx  Phone Note Refill Request   Refills Requested: Medication #1:  GLIPIZIDE 5 MG TB24 take 1 once daily  Medication #2:  QUINAPRIL HCL 20 MG TABS take 1 tab once daily  Medication #3:  FUROSEMIDE 40 MG TABS Take 1 tab once daily  Medication #4:  METFORMIN HCL 1000 MG TABS take 1 tab two times a day [facility]--90 [phone]  Initial call taken by: Freddy Jaksch,  December 27, 2008 9:59 AM  Follow-up for Phone Call        needs rf on meds -- I saw her a1c was 6.5 so I didn't know if you were going to adjust any meds before we called in refills .Marland KitchenMarland KitchenShary Decamp  December 27, 2008 10:41 AM   Additional Follow-up for Phone Call Additional follow up Details #1::        ok to refill Additional Follow-up by: Loreen Freud DO,  December 27, 2008 11:26 AM      Prescriptions: METFORMIN HCL 1000 MG TABS (METFORMIN HCL) take 1 tab two times a day  #180 x 3   Entered by:   Shary Decamp   Authorized by:   Loreen Freud DO   Signed by:   Shary Decamp on 12/27/2008   Method used:   Electronically to        [facility] (mail[facility]order)             ,          Ph: [phone]       Fax: [phone]   RxID:   [id number] QUINAPRIL HCL 20 MG TABS (QUINAPRIL HCL) take 1 tab once daily  #90 x 3   Entered by:   Shary Decamp   Authorized by:   Loreen Freud DO   Signed by:   Shary Decamp on 12/27/2008   Method used:   Electronically to        [facility] (mail[facility]order)             ,          Ph: [phone]       Fax: [phone]   RxID:   [id number] GLIPIZIDE 5 MG TB24 (GLIPIZIDE) take 1 once daily  #90 x 3   Entered by:   Shary Decamp   Authorized by:   Loreen Freud DO   Signed by:   Shary Decamp on 12/27/2008   Method used:   Electronically to        [facility] (mail[facility]order)             ,          Ph: [phone]       Fax: [phone]   RxID:   [id number] FUROSEMIDE 40 MG TABS (FUROSEMIDE) Take 1 tab once daily  #90 x 3   Entered by:    Shary Decamp   Authorized by:   Loreen Freud DO   Signed by:   Shary Decamp on 12/27/2008   Method used:   Electronically to        [facility] (mail[facility]order)             ,          Ph: [phone]       Fax: [phone]   RxID:   [id number]

## 2010-12-01 NOTE — Assessment & Plan Note (Signed)
Summary: congested,cough.cbs   Vital Signs:  Patient Profile:   60 Years Old Female Weight:      376.38 pounds O2 Sat:      94 % Temp:     98.3 degrees F oral Pulse rate:   78 / minute Resp:     18 per minute BP sitting:   120 / 80  (right arm)  Pt. in pain?   no  Vitals Entered By: Ardyth Man (July 31, 2007 11:06 AM)                  PCP:  Laury Axon  Chief Complaint:  sore throat, congestion, rt. ear ache x 10 days, and URI symptoms.  History of Present Illness:  URI Symptoms      This is a 60 year old woman who presents with URI symptoms.  The symptoms began duration > 11 days ago.  The patient reports  nasal discharge, sore throat, dry cough, earache, and sick contacts.  The patient denies fever, wheezing, SOB, DOE,  rash, and vomiting.  The patient also reports sneezing.  The patient denies the following risk factors for Strep sinusitis: tooth pain.    Current Allergies: ! PCN ! VICODIN ! OXYCODONE HCL      Physical Exam  General:     Well-developed,well-nourished,in no acute distress; alert,appropriate and cooperative throughout examination Ears:     right tympanic membrane mildly erythematous Nose:     swollen turbinates with yellowish clear nasal discharge Mouth:     Oral mucosa and oropharynx without lesions or exudates.  Teeth in good repair. Neck:     No deformities, masses, or tenderness noted. Lungs:     bronchial breath sounds without wheezing or crackles.  Good air movement Heart:     Normal rate and regular rhythm. S1 and S2 normal without gallop, murmur, click, rub or other extra sounds.    Impression & Recommendations:  Problem # 1:  BRONCHITIS, ACUTE (ICD-466.0)  Her updated medication list for this problem includes:    Zithromax Z-pak 250 Mg Tabs (Azithromycin) .Marland Kitchen... As directed  Orders: CXR- 2view (CXR)  Take antibiotics and other medications as directed. Encouraged to push clear liquids, get enough rest, and take  acetaminophen as needed. To be seen in 5-7 days if no improvement, sooner if worse i.e. worsening cough, shortness of breath, dyspnea exertion, or fever.Patient may take Mucinex DM.  Advised to monitor blood pressure regularly.   Complete Medication List: 1)  Furosemide 40 Mg Tabs (Furosemide) 2)  Glipizide 5 Mg Tb24 (Glipizide) 3)  Menest 2.5 Mg Tabs (Esterified estrogens) 4)  Fluoxetine Hcl 20 Mg Caps (Fluoxetine hcl) 5)  Nexium 40 Mg Cpdr (Esomeprazole magnesium) 6)  Quinapril Hcl 20 Mg Tabs (Quinapril hcl) 7)  Vytorin 10-40 Mg Tabs (Ezetimibe-simvastatin) 8)  Avandia 4 Mg Tabs (Rosiglitazone maleate) 9)  Metformin Hcl 1000 Mg Tabs (Metformin hcl) 10)  Fluoxetine Hcl 40 Mg Caps (Fluoxetine hcl) 11)  Zithromax Z-pak 250 Mg Tabs (Azithromycin) .... As directed     Prescriptions: ZITHROMAX Z-PAK 250 MG  TABS (AZITHROMYCIN) as directed  #1 x 0   Entered and Authorized by:   Leanne Chang MD   Signed by:   Leanne Chang MD on 07/31/2007   Method used:   Electronically sent to ...       [phone] [address]       [address]       [place]  [place], [place]  [address]       Ph: [phone] or [phone]       Fax: [phone]   RxID:   [id number]  ] Laboratory Results  Date/Time Received: ...................................................................Ardyth Man  July 31, 2007 11:12 AM  Date/Time Reported: ...................................................................Ardyth Man  July 31, 2007 11:12 AM   Other Tests  Rapid Strep: negative

## 2010-12-01 NOTE — Letter (Signed)
Summary: Results Follow-up Letter  [place] [facility]  [address] [place], [place] [address]   Phone: [phone]  Fax: [phone]    01/22/2008        [facility] [address] [place], [place]  [address]  Dear Ms. Lard,   The following are the results of your recent test(s):  Test     Result     Pap Smear    Normal_______  Not Normal_____       Comments: _________________________________________________________ Cholesterol LDL(Bad cholesterol):          Your goal is less than:         HDL (Good cholesterol):        Your goal is more than: _________________________________________________________ Other Tests:   _________________________________________________________  Please call for an appointment Or Please see attached._________________________________________________________ _________________________________________________________ _________________________________________________________  Sincerely,  Ardyth Man [place] at [facility]

## 2010-12-01 NOTE — Progress Notes (Signed)
Summary: [facility]--refill  Phone Note Refill Request   Refills Requested: Medication #1:  NEXIUM 40 MG CPDR [facility]--[phone]  Initial call taken by: Freddy Jaksch,  September 30, 2008 8:42 AM      Prescriptions: NEXIUM 40 MG CPDR (ESOMEPRAZOLE MAGNESIUM)   #90 x 1   Entered by:   Ardyth Man   Authorized by:   Loreen Freud DO   Signed by:   Ardyth Man on 09/30/2008   Method used:   Faxed to ...       [facility] (mail[facility]order)             ,          Ph: [phone]       Fax: [phone]   RxID:   [id number]

## 2010-12-01 NOTE — Letter (Signed)
Summary: Results Follow up Letter  [facility]  [address] [place], [place] [address]   Phone: [phone]  Fax: [phone]    04/24/2007 MRN: [id number]  [facility] [address] [place], [place]  [address]  Dear Ms. Brasil,  The following are the results of your recent test(s):  Test         Result    Pap Smear:        Normal __X___  Not Normal _____ Comments: ______________________________________________________ Cholesterol: LDL(Bad cholesterol):         Your goal is less than:         HDL (Good cholesterol):       Your goal is more than: Comments:  ______________________________________________________ Mammogram:        Normal _____  Not Normal _____ Comments:  ___________________________________________________________________ Hemoccult:        Normal _____  Not normal _______ Comments:    _____________________________________________________________________ Other Tests:    We routinely do not discuss normal results over the telephone.  If you desire a copy of the results, or you have any questions about this information we can discuss them at your next office visit.   Sincerely,

## 2010-12-01 NOTE — Progress Notes (Signed)
Summary: FLUOXETINE HCL 20MG /GLIPIZIDE 5MG ,VYTORIN 10/40MG .METFORMIN HCL   Phone Note Refill Request   Refills Requested: Medication #1:  FLUOXETINE HCL 20 MG CAPS TAKE ONE CAPSULE DAILY  Medication #2:  GLIPIZIDE 5 MG TB24 TAKE ONE TABLET DAILY  Medication #3:  VYTORIN 10-40 MG TABS TAKE ONE TABLET DAILY  Medication #4:  METFORMIN HCL 1000 MG TABS RECD BY FAX [facility] ***FAX TO FOLLOW*** [phone]  Initial call taken by: Gwen Pounds,  September 25, 2007 10:05 AM      Prescriptions: FLUOXETINE HCL 40 MG CAPS (FLUOXETINE HCL)   #90 x 0   Entered by:   Ardyth Man   Authorized by:   Leanne Chang MD   Signed by:   Ardyth Man on 09/25/2007   Method used:   Historical   RxID:   [id number] METFORMIN HCL 1000 MG TABS (METFORMIN HCL)   #90 x 0   Entered by:   Ardyth Man   Authorized by:   Leanne Chang MD   Signed by:   Ardyth Man on 09/25/2007   Method used:   Historical   RxID:   [id number] AVANDIA 4 MG TABS (ROSIGLITAZONE MALEATE)   #90 x 0   Entered by:   Ardyth Man   Authorized by:   Leanne Chang MD   Signed by:   Ardyth Man on 09/25/2007   Method used:   Historical   RxID:   [id number] VYTORIN 10-40 MG TABS (EZETIMIBE-SIMVASTATIN)   #90 x 0   Entered by:   Ardyth Man   Authorized by:   Leanne Chang MD   Signed by:   Ardyth Man on 09/25/2007   Method used:   Historical   RxID:   [id number] QUINAPRIL HCL 20 MG TABS (QUINAPRIL HCL)   #90 x 0   Entered by:   Ardyth Man   Authorized by:   Leanne Chang MD   Signed by:   Ardyth Man on 09/25/2007   Method used:   Historical   RxID:   [id number] NEXIUM 40 MG CPDR (ESOMEPRAZOLE MAGNESIUM)   #90 x 0   Entered by:   Ardyth Man   Authorized by:   Leanne Chang MD   Signed by:   Ardyth Man on 09/25/2007   Method used:   Historical   RxID:   [id number] FLUOXETINE HCL 20 MG CAPS (FLUOXETINE HCL)   #90 x 0   Entered by:    Ardyth Man   Authorized by:   Leanne Chang MD   Signed by:   Ardyth Man on 09/25/2007   Method used:   Historical   RxID:   [id number] GLIPIZIDE 5 MG TB24 (GLIPIZIDE)   #90 x 0   Entered by:   Ardyth Man   Authorized by:   Leanne Chang MD   Signed by:   Ardyth Man on 09/25/2007   Method used:   Historical   RxID:   [id number] FUROSEMIDE 40 MG TABS (FUROSEMIDE)   #90 x 0   Entered by:   Ardyth Man   Authorized by:   Leanne Chang MD   Signed by:   Ardyth Man on 09/25/2007   Method used:   Historical   RxID:   [id number] FLUOXETINE HCL 40 MG CAPS (FLUOXETINE HCL)   #90 x 0   Entered and Authorized by:   Ardyth Man   Signed by:   Ardyth Man on 09/25/2007   Method used:   Print then Give to Patient   RxID:   [id number] METFORMIN  HCL 1000 MG TABS (METFORMIN HCL)   #90 x 0   Entered and Authorized by:   Ardyth Man   Signed by:   Ardyth Man on 09/25/2007   Method used:   Print then Give to Patient   RxID:   [id number] AVANDIA 4 MG TABS (ROSIGLITAZONE MALEATE)   #90 x 0   Entered and Authorized by:   Ardyth Man   Signed by:   Ardyth Man on 09/25/2007   Method used:   Print then Give to Patient   RxID:   [id number] VYTORIN 10-40 MG TABS (EZETIMIBE-SIMVASTATIN)   #90 x 0   Entered and Authorized by:   Ardyth Man   Signed by:   Ardyth Man on 09/25/2007   Method used:   Print then Give to Patient   RxID:   [id number] QUINAPRIL HCL 20 MG TABS (QUINAPRIL HCL)   #90 x 0   Entered and Authorized by:   Ardyth Man   Signed by:   Ardyth Man on 09/25/2007   Method used:   Print then Give to Patient   RxID:   [id number] NEXIUM 40 MG CPDR (ESOMEPRAZOLE MAGNESIUM)   #90 x 0   Entered and Authorized by:   Ardyth Man   Signed by:   Ardyth Man on 09/25/2007   Method used:   Print then Give to Patient   RxID:   [id number] FLUOXETINE HCL 20 MG CAPS  (FLUOXETINE HCL)   #90 x 0   Entered and Authorized by:   Ardyth Man   Signed by:   Ardyth Man on 09/25/2007   Method used:   Print then Give to Patient   RxID:   [id number] GLIPIZIDE 5 MG TB24 (GLIPIZIDE)   #90 x 0   Entered and Authorized by:   Ardyth Man   Signed by:   Ardyth Man on 09/25/2007   Method used:   Print then Give to Patient   RxID:   [id number] FUROSEMIDE 40 MG TABS (FUROSEMIDE)   #90 x 0   Entered and Authorized by:   Ardyth Man   Signed by:   Ardyth Man on 09/25/2007   Method used:   Print then Give to Patient   RxID:   [id number]

## 2010-12-01 NOTE — Progress Notes (Signed)
Summary: refill   Phone Note Refill Request   Refills Requested: Medication #1:  FUROSEMIDE 40 MG TABS  Medication #2:  QUINAPRIL HCL 20 MG TABS  Medication #3:  FLUOXETINE HCL 20 MG CAPS Take one tablet daily  Medication #4:  GLIPIZIDE 5 MG TB24 via fax [facility] [phone]  Initial call taken by: Charolette Child,  October 16, 2008 1:28 PM      Prescriptions: FLUOXETINE HCL 20 MG CAPS (FLUOXETINE HCL) Take one tablet daily  #90 x 0   Entered by:   Kandice Hams   Authorized by:   Loreen Freud DO   Signed by:   Kandice Hams on 10/16/2008   Method used:   Faxed to ...       [facility]* (mail-order)             ,          Ph: [phone]       Fax: [phone]   RxID:   [id number] QUINAPRIL HCL 20 MG TABS (QUINAPRIL HCL)   #90 x 0   Entered by:   Kandice Hams   Authorized by:   Loreen Freud DO   Signed by:   Kandice Hams on 10/16/2008   Method used:   Faxed to ...       [facility]* (mail-order)             ,          Ph: [phone]       Fax: [phone]   RxID:   [id number] GLIPIZIDE 5 MG TB24 (GLIPIZIDE)   #90 x 0   Entered by:   Kandice Hams   Authorized by:   Loreen Freud DO   Signed by:   Kandice Hams on 10/16/2008   Method used:   Faxed to ...       [facility]* (mail-order)             ,          Ph: [phone]       Fax: [phone]   RxID:   [id number] FUROSEMIDE 40 MG TABS (FUROSEMIDE)   #90 x 0   Entered by:   Kandice Hams   Authorized by:   Loreen Freud DO   Signed by:   Kandice Hams on 10/16/2008   Method used:   Faxed to ...       [facility]* (mail-order)             ,          Ph: [phone]       Fax: [phone]   RxID:   [id number]

## 2010-12-01 NOTE — Progress Notes (Signed)
Summary: REFILL  Phone Note Refill Request Message from:  Pharmacy on [facility] FAX [phone]  Refills Requested: Medication #1:  VYTORIN 10-40 MG TABS take 1 tab once daily  Medication #2:  MENEST 2.5 MG TABS Take one tablet every other day Initial call taken by: Barb Merino,  July 11, 2009 9:26 AM    Prescriptions: VYTORIN 10-40 MG TABS (EZETIMIBE-SIMVASTATIN) take 1 tab once daily  #90 x 1   Entered by:   Kandice Hams   Authorized by:   Loreen Freud DO   Signed by:   Kandice Hams on 07/11/2009   Method used:   Faxed to ...       [facility] [employer]* (mail-order)             ,          Ph: [phone]       Fax: [phone]   RxID:   [id number] MENEST 2.5 MG TABS (ESTERIFIED ESTROGENS) Take one tablet every other day  #45 x 1   Entered by:   Kandice Hams   Authorized by:   Loreen Freud DO   Signed by:   Kandice Hams on 07/11/2009   Method used:   Faxed to ...       [facility]* (mail-order)             ,          Ph: [phone]       Fax: [phone]   RxID:   [id number]

## 2010-12-01 NOTE — Assessment & Plan Note (Signed)
Summary: 6 MONTH OV//PH   Vital Signs:  Patient profile:   60 year old female Height:      62 inches Weight:      316 pounds BMI:     58.01 BSA:     2.32 Temp:     98.8 degrees F oral Pulse rate:   70 / minute BP sitting:   120 / 72  (left arm)  Vitals Entered By: Jeremy Johann CMA (May 11, 2010 12:57 PM) CC: 6 month f/u Comments REVIEWED MED LIST, PATIENT AGREED DOSE AND INSTRUCTION CORRECT    History of Present Illness:  Hyperlipidemia follow-up      This is a 60 year old woman who presents for Hyperlipidemia follow-up.  The patient denies muscle aches, GI upset, abdominal pain, flushing, itching, constipation, diarrhea, and fatigue.  The patient denies the following symptoms: chest pain/pressure, exercise intolerance, dypsnea, palpitations, syncope, and pedal edema.  Compliance with medications (by patient report) has been near 100%.  Dietary compliance has been good.  The patient reports no exercise.  Adjunctive measures currently used by the patient include ASA and weight reduction.    Hypertension follow-up      The patient also presents for Hypertension follow-up.  The patient denies lightheadedness, urinary frequency, headaches, edema, impotence, rash, and fatigue.  The patient denies the following associated symptoms: chest pain, chest pressure, exercise intolerance, dyspnea, palpitations, syncope, leg edema, and pedal edema.  Compliance with medications (by patient report) has been near 100%.  The patient reports that dietary compliance has been fair.  The patient reports no exercise.  Adjunctive measures currently used by the patient include salt restriction.    Type 1 diabetes mellitus follow-up      The patient is also here for Type 2 diabetes mellitus follow-up.  The patient denies polyuria, polydipsia, blurred vision, self managed hypoglycemia, hypoglycemia requiring help, weight loss, weight gain, and numbness of extremities.  The patient denies the following symptoms:  neuropathic pain, chest pain, vomiting, orthostatic symptoms, poor wound healing, intermittent claudication, vision loss, and foot ulcer.  Since the last visit the patient reports good dietary compliance, compliance with medications, not exercising regularly, and not monitoring blood glucose.  Since the last visit, the patient reports having had eye care by an ophthalmologist and foot care by a podiatrist.   Pt seeing podiatrist Wednesday.  Preventive Screening-Counseling & Management  Alcohol-Tobacco     Smoking Status: quit  Caffeine-Diet-Exercise     Does Patient Exercise: no  Current Medications (verified): 1)  Glimepiride 2 Mg Tabs (Glimepiride) .Marland Kitchen.. 1 By Mouth Once Daily 2)  Menest 2.5 Mg Tabs (Esterified Estrogens) .... Take One Tablet Every Other Day 3)  Ranitidine Hcl 300 Mg Caps (Ranitidine Hcl) .Marland Kitchen.. 1 By Mouth Once Daily 4)  Benazepril Hcl 20 Mg Tabs (Benazepril Hcl) .Marland Kitchen.. 1 By Mouth Qd 5)  Vytorin 10-40 Mg Tabs (Ezetimibe-Simvastatin) .... Take 1 Tab Once Daily 6)  Metformin Hcl 1000 Mg Tabs (Metformin Hcl) .... Take 1 Tab Two Times A Day 7)  Tylenol Arthritis Pain 650 Mg Cr-Tabs (Acetaminophen) .... Take 1 Tab Two Times A Day As Needed 8)  Prozac 20 Mg Caps (Fluoxetine Hcl) .... 3 By Mouth Once Daily 9)  Freestyle Lite Test  Strp (Glucose Blood) .... Use One Strip Twice Daily. 10)  Freestyle Unistick Ii Lancets  Misc (Lancets) .... Use One Lancet Twice Daily. 11)  Victoza 18 Mg/61ml Soln (Liraglutide) .... Administer 1.8 Ml Daily 12)  Novofine 32g X 6 Mm  Misc (Insulin Pen Needle) .... As Directed  Allergies: 1)  ! Pcn 2)  ! Vicodin 3)  ! Oxycodone Hcl  Past History:  Past medical, surgical, family and social histories (including risk factors) reviewed for relevance to current acute and chronic problems.  Past Medical History: Reviewed history from 12/14/2006 and no changes required. Depression Diabetes mellitus, type II GERD Hyperlipidemia Hypertension  Past  Surgical History: Hysterectomy  TAH, BSO  1981  Family History: Reviewed history from 11/10/2009 and no changes required. M--Macular degeneration Family History Lung cancer-- asbestos Family History Diabetes 1st degree relative Family History Weight disorder  Social History: Reviewed history from 11/10/2009 and no changes required. Married Former Smoker Alcohol use-no Drug use-no Regular exercise-no  Review of Systems      See HPI  Physical Exam  General:  Well-developed,well-nourished,in no acute distress; alert,appropriate and cooperative throughout examinationoverweight-appearing.   Lungs:  Normal respiratory effort, chest expands symmetrically. Lungs are clear to auscultation, no crackles or wheezes. Heart:  normal rate and no murmur.   Psych:  Oriented X3 and normally interactive.    Diabetes Management Exam:    Eye Exam:       Eye Exam done elsewhere          Date: 07/16/2009          Results: normal          Done by: Dr Nelle Don    Impression & Recommendations:  Problem # 1:  HYPERTENSION (ICD-401.9)  Her updated medication list for this problem includes:    Benazepril Hcl 20 Mg Tabs (Benazepril hcl) .Marland Kitchen... 1 by mouth qd  Problem # 2:  HYPERLIPIDEMIA (ICD-272.4)  Her updated medication list for this problem includes:    Vytorin 10-40 Mg Tabs (Ezetimibe-simvastatin) .Marland Kitchen... Take 1 tab once daily  Problem # 3:  DIABETES MELLITUS, TYPE II (ICD-250.00)  Her updated medication list for this problem includes:    Glimepiride 2 Mg Tabs (Glimepiride) .Marland Kitchen... 1 by mouth once daily    Benazepril Hcl 20 Mg Tabs (Benazepril hcl) .Marland Kitchen... 1 by mouth qd    Metformin Hcl 1000 Mg Tabs (Metformin hcl) .Marland Kitchen... Take 1 tab two times a day    Victoza 18 Mg/34ml Soln (Liraglutide) .Marland Kitchen... Administer 1.8 ml daily  Labs Reviewed: Creat: 0.8 (04/27/2010)     Last Eye Exam: normal (07/16/2009) Reviewed HgBA1c results: 6.6 (04/27/2010)  6.0 (11/05/2009)  Problem # 4:  GERD  (ICD-530.81)  Her updated medication list for this problem includes:    Ranitidine Hcl 300 Mg Caps (Ranitidine hcl) .Marland Kitchen... 1 by mouth once daily  Diagnostics Reviewed:  Discussed lifestyle modifications, diet, antacids/medications, and preventive measures. Handout provided.   Complete Medication List: 1)  Glimepiride 2 Mg Tabs (Glimepiride) .Marland Kitchen.. 1 by mouth once daily 2)  Menest 2.5 Mg Tabs (Esterified estrogens) .... Take one tablet every other day 3)  Ranitidine Hcl 300 Mg Caps (Ranitidine hcl) .Marland Kitchen.. 1 by mouth once daily 4)  Benazepril Hcl 20 Mg Tabs (Benazepril hcl) .Marland Kitchen.. 1 by mouth qd 5)  Vytorin 10-40 Mg Tabs (Ezetimibe-simvastatin) .... Take 1 tab once daily 6)  Metformin Hcl 1000 Mg Tabs (Metformin hcl) .... Take 1 tab two times a day 7)  Tylenol Arthritis Pain 650 Mg Cr-tabs (Acetaminophen) .... Take 1 tab two times a day as needed 8)  Prozac 20 Mg Caps (Fluoxetine hcl) .... 3 by mouth once daily 9)  Freestyle Lite Test Strp (Glucose blood) .... Use one strip twice daily. 10)  Freestyle Unistick  Ii Lancets Misc (Lancets) .... Use one lancet twice daily. 11)  Victoza 18 Mg/100ml Soln (Liraglutide) .... Administer 1.8 ml daily 12)  Novofine 32g X 6 Mm Misc (Insulin pen needle) .... As directed  Patient Instructions: 1)  schedule physical  2)  labs 2 weeks before---272.4  250.00 401.9  v70.0  cbcd, bmp, hep, lipid, hgba1c, microalbumin, tsh, ua

## 2010-12-01 NOTE — Letter (Signed)
Summary: Results Follow-up Letter  [place] [facility]  [address] [place], [place] [address]   Phone: [phone]  Fax: [phone]    05/20/2009        Andrea Marks [address] [place], [place]  [address]  Dear [name],   The following are the results of your recent test(s):  Test     Result     Pap Smear    Normal_______  Not Normal_____       Comments: _________________________________________________________ Cholesterol LDL(Bad cholesterol):          Your goal is less than:         HDL (Good cholesterol):        Your goal is more than: _________________________________________________________ Other Tests:   _________________________________________________________  Please call for an appointment Or _Please see attached labwork.________________________________________________________ _________________________________________________________ _________________________________________________________  Sincerely,  Ardyth Man  at [facility]

## 2010-12-01 NOTE — Assessment & Plan Note (Signed)
Summary: BLACK AND BLUE MARK ON STOMACH  Medications Added FUROSEMIDE 40 MG TABS (FUROSEMIDE)  GLIPIZIDE 5 MG TB24 (GLIPIZIDE)  MENEST 2.5 MG TABS (ESTERIFIED ESTROGENS)  FLUOXETINE HCL 20 MG CAPS (FLUOXETINE HCL)  NEXIUM 40 MG CPDR (ESOMEPRAZOLE MAGNESIUM)  QUINAPRIL HCL 20 MG TABS (QUINAPRIL HCL)  VYTORIN 10-40 MG TABS (EZETIMIBE-SIMVASTATIN)  AVANDIA 4 MG TABS (ROSIGLITAZONE MALEATE)  METFORMIN HCL 1000 MG TABS (METFORMIN HCL)  FLUOXETINE HCL 40 MG CAPS (FLUOXETINE HCL)       Allergies Added: ! PCN ! VICODIN ! OXYCODONE HCL (OXYCODONE HCL CAPS)  Vital Signs:  Patient Profile:   60 Years Old Female Weight:      364.2 pounds Temp:     98.9 degrees F oral Pulse rate:   88 / minute BP sitting:   134 / 84  (left arm)  Vitals Entered By: Shonna Chock (April 19, 2007 11:53 AM)               PCP:  Laury Axon  Chief Complaint:  BRUISE ON STOMACH SINCE 04/16/2007.  History of Present Illness: Pt here c/o bruise on R side of abd-- upper abd.  No injury.  Pt does give hx of severe coughing and sneezing over weekend -- she noticed bruise next day.  Now better.  no other symptoms--but pt concerned.   Current Allergies: ! PCN ! VICODIN ! OXYCODONE HCL (OXYCODONE HCL CAPS)     Review of Systems      See HPI  Resp      Denies cough, shortness of breath, sputum productive, and wheezing.  GI      Denies abdominal pain, constipation, diarrhea, indigestion, nausea, and vomiting.  Derm      Complains of changes in color of skin.      Denies insect bite(s), itching, poor wound healing, and rash.      bruise on abd   Physical Exam  General:     Well-developed,well-nourished,in no acute distress; alert,appropriate and cooperative throughout examination Abdomen:     Lg ecchymotic area r side upper abd.  No pain, edges of bruise turning yellow.soft.  soft, non-tender, no guarding, no rigidity, and no rebound tenderness.      Impression & Recommendations:  Problem # 1:   CONTUSION, ABDOMINAL WALL (ICD-922.2) Assessment: New warm compresses  -- call if no improvement Orders: Venipuncture (16109) TLB-CBC Platelet - w/Differential (85025-CBCD) T- * Misc. Laboratory test [phone])   Medications Added to Medication List This Visit: 1)  Furosemide 40 Mg Tabs (Furosemide) 2)  Glipizide 5 Mg Tb24 (Glipizide) 3)  Menest 2.5 Mg Tabs (Esterified estrogens) 4)  Fluoxetine Hcl 20 Mg Caps (Fluoxetine hcl) 5)  Nexium 40 Mg Cpdr (Esomeprazole magnesium) 6)  Quinapril Hcl 20 Mg Tabs (Quinapril hcl) 7)  Vytorin 10-40 Mg Tabs (Ezetimibe-simvastatin) 8)  Avandia 4 Mg Tabs (Rosiglitazone maleate) 9)  Metformin Hcl 1000 Mg Tabs (Metformin hcl) 10)  Fluoxetine Hcl 40 Mg Caps (Fluoxetine hcl)

## 2010-12-01 NOTE — Progress Notes (Signed)
Summary: [facility]  Phone Note From Pharmacy   Caller: [facility] Call For: DIRECTIONS ON MED  Summary of Call: [facility] SPOKE WITH REPRESENTATIVE WHO SAYS NO DIRECTIONS ON RX INFORMED REP NAME PETER NGUYEN DIRECTIONS ARE 1 by mouth once daily ON ALL MEDS FAXED 10/16/08 AND INFORMED PT NEEDS A RUSH ON ORDER SINCE FAXED ON 10/16/08 PT HAS BEEN INFORMED Initial call taken by: Kandice Hams,  October 21, 2008 1:19 PM

## 2010-12-01 NOTE — Assessment & Plan Note (Signed)
Summary: discuss dose for med.cbs   Vital Signs:  Patient Profile:   60 Years Old Female Weight:      359.6 pounds Temp:     98.3 degrees F oral Pulse rate:   92 / minute BP sitting:   140 / 70  (left arm)  Pt. in pain?   no  Vitals Entered By: Jeremy Johann CMA (December 13, 2008 3:07 PM)                  PCP:  Laury Axon  Chief Complaint:  discuss med doses.  History of Present Illness: Pt here to discuss meds for anxiety. Pt has been under a lot of stress with husband being sick and needs refills on meds.       Current Allergies: ! PCN ! VICODIN ! OXYCODONE HCL  Past Medical History:    Reviewed history from 12/14/2006 and no changes required:       Depression       Diabetes mellitus, type II       GERD       Hyperlipidemia       Hypertension     Review of Systems      See HPI   Physical Exam  General:     Well-developed,well-nourished,in no acute distress; alert,appropriate and cooperative throughout examination Psych:     Cognition and judgment appear intact. Alert and cooperative with normal attention span and concentration. No apparent delusions, illusions, hallucinations    Impression & Recommendations:  Problem # 1:  DEPRESSION (ICD-311)  The following medications were removed from the medication list:    Fluoxetine Hcl 20 Mg Caps (Fluoxetine hcl) .Marland Kitchen... Take one tablet daily    Fluoxetine Hcl 40 Mg Caps (Fluoxetine hcl) .Marland Kitchen... Take 1 tab once daily  Her updated medication list for this problem includes:    Prozac 20 Mg Caps (Fluoxetine hcl) .Marland KitchenMarland KitchenMarland KitchenMarland Kitchen 3 by mouth once daily   Problem # 2:  HYPERTENSION (ICD-401.9)  Her updated medication list for this problem includes:    Furosemide 40 Mg Tabs (Furosemide) .Marland Kitchen... Take 1 tab once daily    Quinapril Hcl 20 Mg Tabs (Quinapril hcl) .Marland Kitchen... Take 1 tab once daily  BP today: 140/70 Prior BP: 128/84 (01/15/2008)  Labs Reviewed: Creat: 0.7 (01/15/2008) Chol: 141 (01/15/2008)   HDL: 55.9 (01/15/2008)    LDL: 65 (01/15/2008)   TG: 100 (01/15/2008)   Problem # 3:  HYPERLIPIDEMIA (ICD-272.4) pt will return for labs and f/u Her updated medication list for this problem includes:    Vytorin 10-40 Mg Tabs (Ezetimibe-simvastatin) .Marland Kitchen... Take 1 tab once daily  Labs Reviewed: Chol: 141 (01/15/2008)   HDL: 55.9 (01/15/2008)   LDL: 65 (01/15/2008)   TG: 100 (01/15/2008) SGOT: 23 (01/15/2008)   SGPT: 15 (01/15/2008)   Problem # 4:  DIABETES MELLITUS, TYPE II (ICD-250.00) pt will return for labs and f/u Her updated medication list for this problem includes:    Glipizide 5 Mg Tb24 (Glipizide) .Marland Kitchen... Take 1 once daily    Quinapril Hcl 20 Mg Tabs (Quinapril hcl) .Marland Kitchen... Take 1 tab once daily    Avandia 4 Mg Tabs (Rosiglitazone maleate) .Marland Kitchen... Take 1 tab two times a day    Metformin Hcl 1000 Mg Tabs (Metformin hcl) .Marland Kitchen... Take 1 tab two times a day  Labs Reviewed: HgBA1c: 6.8 (01/15/2008)   Creat: 0.7 (01/15/2008)      Complete Medication List: 1)  Furosemide 40 Mg Tabs (Furosemide) .... Take 1 tab once daily 2)  Glipizide 5 Mg Tb24 (Glipizide) .... Take 1 once daily 3)  Menest 2.5 Mg Tabs (Esterified estrogens) .... Take one tablet every other day 4)  Nexium 40 Mg Cpdr (Esomeprazole magnesium) .... Take 1 once daily 5)  Quinapril Hcl 20 Mg Tabs (Quinapril hcl) .... Take 1 tab once daily 6)  Vytorin 10-40 Mg Tabs (Ezetimibe-simvastatin) .... Take 1 tab once daily 7)  Avandia 4 Mg Tabs (Rosiglitazone maleate) .... Take 1 tab two times a day 8)  Metformin Hcl 1000 Mg Tabs (Metformin hcl) .... Take 1 tab two times a day 9)  Tylenol Arthritis Pain 650 Mg Cr-tabs (Acetaminophen) .... Take 1 tab two times a day as needed 10)  Prozac 20 Mg Caps (Fluoxetine hcl) .... 3 by mouth once daily   Patient Instructions: 1)  fasting labs -- 272.4 ,250.00 ,  401.9   cbcd, bmp, lipid, hep, hgba1c, microalbumin, tsh 2)  ov 2 weeks after labs    Prescriptions: NEXIUM 40 MG CPDR (ESOMEPRAZOLE MAGNESIUM) take 1 once  daily  #90 x 3   Entered and Authorized by:   Loreen Freud DO   Signed by:   Loreen Freud DO on 12/13/2008   Method used:   Electronically to        [facility]* (mail-order)             ,          Ph: [phone]       Fax: [phone]   RxID:   [id number] PROZAC 20 MG CAPS (FLUOXETINE HCL) 3 by mouth once daily  #270 x 3   Entered and Authorized by:   Loreen Freud DO   Signed by:   Loreen Freud DO on 12/13/2008   Method used:   Electronically to        [facility]* (mail-order)             ,          Ph: [phone]       Fax: [phone]   RxID:   [id number]

## 2010-12-01 NOTE — Assessment & Plan Note (Signed)
Summary: roa 2 weeks.cbs   Vital Signs:  Patient Profile:   60 Years Old Female Weight:      381.6 pounds Temp:     98.2 degrees F oral Pulse rate:   80 / minute BP sitting:   140 / 78  (left arm)  Pt. in pain?   no  Vitals Entered By: Jeremy Johann CMA (December 30, 2008 12:50 PM)                  PCP:  Laury Axon  Chief Complaint:  follow-up lab work and rash on right breast.  History of Present Illness: Pt here to review labs. She also c/o rash under R breast.  She has used neosporin and powder with no relief.   +very itchy    Current Allergies (reviewed today): ! PCN ! VICODIN ! OXYCODONE HCL  Past Medical History:    Reviewed history from 12/14/2006 and no changes required:       Depression       Diabetes mellitus, type II       GERD       Hyperlipidemia       Hypertension     Review of Systems      See HPI   Physical Exam  General:     Well-developed,well-nourished,in no acute distress; alert,appropriate and cooperative throughout examination Skin:     under R breast---red and irritated c/w yeast Cervical Nodes:     No lymphadenopathy noted Psych:     Oriented X3, memory intact for recent and remote, and normally interactive.      Impression & Recommendations:  Problem # 1:  TINEA CORPORIS (ICD-110.5) lotrisone two times a day for 2 weeks call or rto as needed   Problem # 2:  HYPERLIPIDEMIA (ICD-272.4)  Her updated medication list for this problem includes:    Vytorin 10-40 Mg Tabs (Ezetimibe-simvastatin) .Marland Kitchen... Take 1 tab once daily  Labs Reviewed: Chol: 139 (12/20/2008)   HDL: 53.7 (12/20/2008)   LDL: 62 (12/20/2008)   TG: 115 (12/20/2008) SGOT: 22 (12/20/2008)   SGPT: 14 (12/20/2008)   Problem # 3:  DIABETES MELLITUS, TYPE II (ICD-250.00)  Her updated medication list for this problem includes:    Glipizide 5 Mg Tb24 (Glipizide) .Marland Kitchen... Take 1 once daily    Quinapril Hcl 20 Mg Tabs (Quinapril hcl) .Marland Kitchen... Take 1 tab once daily  Avandia 4 Mg Tabs (Rosiglitazone maleate) .Marland Kitchen... Take 1 tab two times a day    Metformin Hcl 1000 Mg Tabs (Metformin hcl) .Marland Kitchen... Take 1 tab two times a day  Labs Reviewed: HgBA1c: 6.5 (12/20/2008)   Creat: 0.6 (12/20/2008)   Microalbumin: 1.2 (12/20/2008)   Problem # 4:  HYPERTENSION (ICD-401.9)  Her updated medication list for this problem includes:    Furosemide 40 Mg Tabs (Furosemide) .Marland Kitchen... Take 1 tab once daily    Quinapril Hcl 20 Mg Tabs (Quinapril hcl) .Marland Kitchen... Take 1 tab once daily  BP today: 140/78 Prior BP: 140/70 (12/13/2008)  Labs Reviewed: Creat: 0.6 (12/20/2008) Chol: 139 (12/20/2008)   HDL: 53.7 (12/20/2008)   LDL: 62 (12/20/2008)   TG: 115 (12/20/2008)   Complete Medication List: 1)  Furosemide 40 Mg Tabs (Furosemide) .... Take 1 tab once daily 2)  Glipizide 5 Mg Tb24 (Glipizide) .... Take 1 once daily 3)  Menest 2.5 Mg Tabs (Esterified estrogens) .... Take one tablet every other day 4)  Nexium 40 Mg Cpdr (Esomeprazole magnesium) .... Take 1 once daily 5)  Quinapril Hcl 20  Mg Tabs (Quinapril hcl) .... Take 1 tab once daily 6)  Vytorin 10-40 Mg Tabs (Ezetimibe-simvastatin) .... Take 1 tab once daily 7)  Avandia 4 Mg Tabs (Rosiglitazone maleate) .... Take 1 tab two times a day 8)  Metformin Hcl 1000 Mg Tabs (Metformin hcl) .... Take 1 tab two times a day 9)  Tylenol Arthritis Pain 650 Mg Cr-tabs (Acetaminophen) .... Take 1 tab two times a day as needed 10)  Prozac 20 Mg Caps (Fluoxetine hcl) .... 3 by mouth once daily 11)  Lotrisone 1-0.05 % Lotn (Clotrimazole-betamethasone) .... Apply two times a day   Patient Instructions: 1)  fasting labs in june or july------250.00  272.4   lipid, hep, bmp,  hgba1c 2)  ov 2 weeks after labs   Prescriptions: LOTRISONE 1-0.05 % LOTN (CLOTRIMAZOLE-BETAMETHASONE) apply two times a day  #30 g x 2   Entered and Authorized by:   Loreen Freud DO   Signed by:   Loreen Freud DO on 12/30/2008   Method used:   [facility] [address] [phone]* (retail)       [address]       [place], [place]  [address]       Ph: [phone] or [phone]       Fax: [phone]   RxID:   [id number]

## 2010-12-01 NOTE — Medication Information (Signed)
Summary: Letter Concerning Therapy Considerations with Fluoxetine/[facility]  Letter Concerning Therapy Considerations with Fluoxetine/[facility]   Imported By: Lanelle Bal 12/11/2008 09:01:00  _____________________________________________________________________  External Attachment:    Type:   Image     Comment:   External Document

## 2010-12-01 NOTE — Progress Notes (Signed)
Summary: refill  Phone Note Refill Request Message from:  Fax from Pharmacy on [facility] fax [phone]  Refills Requested: Medication #1:  QUINAPRIL HCL 20 MG TABS take 1 tab once daily Initial call taken by: Barb Merino,  December 01, 2009 8:40 AM    Prescriptions: QUINAPRIL HCL 20 MG TABS (QUINAPRIL HCL) take 1 tab once daily  #90 x 3   Entered by:   Army Fossa CMA   Authorized by:   Loreen Freud DO   Signed by:   Army Fossa CMA on 12/01/2009   Method used:   Electronically to        [facility]* (mail-order)             ,          Ph: [phone]       Fax: [phone]   RxID:   [id number]

## 2010-12-01 NOTE — Progress Notes (Signed)
Summary: REFILL  Phone Note Refill Request Message from:  Pharmacy on [facility] FAX [phone]  Refills Requested: Medication #1:  AVANDIA 4 MG TABS take 1 tab two times a day  Medication #2:  MENEST 2.5 MG TABS Take one tablet every other day Initial call taken by: Barb Merino,  April 11, 2009 9:15 AM      Prescriptions: AVANDIA 4 MG TABS (ROSIGLITAZONE MALEATE) take 1 tab two times a day  #180 x 0   Entered by:   Jeremy Johann CMA   Authorized by:   Loreen Freud DO   Signed by:   Jeremy Johann CMA on 04/11/2009   Method used:   Electronically to        [facility]* (mail-order)             ,          Ph: [phone]       Fax: [phone]   RxID:   [id number] MENEST 2.5 MG TABS (ESTERIFIED ESTROGENS) Take one tablet every other day  #45 x 0   Entered by:   Jeremy Johann CMA   Authorized by:   Loreen Freud DO   Signed by:   Jeremy Johann CMA on 04/11/2009   Method used:   Electronically to        [facility]* (mail-order)             ,          Ph: [phone]       Fax: [phone]   RxID:   [id number]

## 2010-12-01 NOTE — Assessment & Plan Note (Signed)
Summary: RTO 4 MONTHS,CBS   Vital Signs:  Patient profile:   60 year old female Height:      62 inches Weight:      391 pounds BMI:     71.77 Temp:     98.3 degrees F oral Pulse rate:   78 / minute Resp:     18 per minute BP sitting:   140 / 78  (right arm)  Vitals Entered By: Ardyth Man (May 15, 2009 12:46 PM) CC: 4 month check up, review labs Is Patient Diabetic? Yes  Pain Assessment Patient in pain? no       Have you ever been in a relationship where you felt threatened, hurt or afraid?No   Does patient need assistance? Functional Status Self care Ambulation Normal   History of Present Illness:  Hyperlipidemia follow-up      This is a 60 year old woman who presents for Hyperlipidemia follow-up.  The patient denies muscle aches, GI upset, abdominal pain, flushing, itching, constipation, diarrhea, and fatigue.  The patient denies the following symptoms: chest pain/pressure, exercise intolerance, dypsnea, palpitations, syncope, and pedal edema.  Compliance with medications (by patient report) has been near 100%.  Dietary compliance has been fair.  The patient reports no exercise.    Type 1 diabetes mellitus follow-up      The patient is also here for Type 2 diabetes mellitus follow-up.  The patient denies polyuria, polydipsia, blurred vision, self managed hypoglycemia, hypoglycemia requiring help, weight loss, weight gain, and numbness of extremities.  The patient denies the following symptoms: neuropathic pain, chest pain, vomiting, orthostatic symptoms, poor wound healing, intermittent claudication, vision loss, and foot ulcer.  Since the last visit the patient reports compliance with medications, not exercising regularly, and not monitoring blood glucose.  Since the last visit, the patient reports having had eye care by an ophthalmologist and foot care by a podiatrist.    Current Medications (verified): 1)  Furosemide 40 Mg Tabs (Furosemide) .... Take 1 Tab Once  Daily 2)  Glipizide 5 Mg Tb24 (Glipizide) .... Take 1 Once Daily 3)  Menest 2.5 Mg Tabs (Esterified Estrogens) .... Take One Tablet Every Other Day 4)  Nexium 40 Mg Cpdr (Esomeprazole Magnesium) .... Take 1 Once Daily 5)  Quinapril Hcl 20 Mg Tabs (Quinapril Hcl) .... Take 1 Tab Once Daily 6)  Vytorin 10-40 Mg Tabs (Ezetimibe-Simvastatin) .... Take 1 Tab Once Daily 7)  Metformin Hcl 1000 Mg Tabs (Metformin Hcl) .... Take 1 Tab Two Times A Day 8)  Tylenol Arthritis Pain 650 Mg Cr-Tabs (Acetaminophen) .... Take 1 Tab Two Times A Day As Needed 9)  Prozac 20 Mg Caps (Fluoxetine Hcl) .... 3 By Mouth Once Daily 10)  Freestyle Lite Test  Strp (Glucose Blood) .... Use One Strip Twice Daily. 11)  Freestyle Unistick Ii Lancets  Misc (Lancets) .... Use One Lancet Twice Daily.  Allergies (verified): 1)  ! Pcn 2)  ! Vicodin 3)  ! Oxycodone Hcl  Past History:  Past medical, surgical, family and social histories (including risk factors) reviewed, and no changes noted (except as noted below).  Past Medical History: Reviewed history from 12/14/2006 and no changes required. Depression Diabetes mellitus, type II GERD Hyperlipidemia Hypertension  Family History: Reviewed history and no changes required.  Social History: Reviewed history and no changes required.  Physical Exam  General:  Well-developed,well-nourished,in no acute distress; alert,appropriate and cooperative throughout examination Eyes:  pupils equal, pupils round, pupils reactive to light, and no injection.  Lungs:  Normal respiratory effort, chest expands symmetrically. Lungs are clear to auscultation, no crackles or wheezes. Heart:  normal rate and no murmur.    Diabetes Management Exam:    Foot Exam (with socks and/or shoes not present):       Sensory-Pinprick/Light touch:          Left medial foot (L-4): normal          Left dorsal foot (L-5): normal          Left lateral foot (S-1): normal          Right medial foot  (L-4): normal          Right dorsal foot (L-5): normal          Right lateral foot (S-1): normal       Sensory-Monofilament:          Left foot: normal          Right foot: normal       Inspection:          Left foot: normal          Right foot: normal       Nails:          Left foot: normal          Right foot: normal    Eye Exam:       Eye Exam done elsewhere   Impression & Recommendations:  Problem # 1:  HYPERTENSION (ICD-401.9)  Her updated medication list for this problem includes:    Furosemide 40 Mg Tabs (Furosemide) .Marland Kitchen... Take 1 tab once daily    Quinapril Hcl 20 Mg Tabs (Quinapril hcl) .Marland Kitchen... Take 1 tab once daily  BP today: 140/78 Prior BP: 140/78 (12/30/2008)  Labs Reviewed: K+: 4.2 (12/20/2008) Creat: : 0.6 (12/20/2008)   Chol: 139 (12/20/2008)   HDL: 53.7 (12/20/2008)   LDL: 62 (12/20/2008)   TG: 115 (12/20/2008)  Problem # 2:  HYPERLIPIDEMIA (ICD-272.4)  Her updated medication list for this problem includes:    Vytorin 10-40 Mg Tabs (Ezetimibe-simvastatin) .Marland Kitchen... Take 1 tab once daily  Labs Reviewed: SGOT: 22 (12/20/2008)   SGPT: 14 (12/20/2008)   HDL:53.7 (12/20/2008), 55.9 (01/15/2008)  LDL:62 (12/20/2008), 65 (01/15/2008)  Chol:139 (12/20/2008), 141 (01/15/2008)  Trig:115 (12/20/2008), 100 (01/15/2008)  Problem # 3:  DIABETES MELLITUS, TYPE II (ICD-250.00)  The following medications were removed from the medication list:    Avandia 4 Mg Tabs (Rosiglitazone maleate) .Marland Kitchen... Take 1 tab two times a day Her updated medication list for this problem includes:    Glipizide 5 Mg Tb24 (Glipizide) .Marland Kitchen... Take 1 once daily    Quinapril Hcl 20 Mg Tabs (Quinapril hcl) .Marland Kitchen... Take 1 tab once daily    Metformin Hcl 1000 Mg Tabs (Metformin hcl) .Marland Kitchen... Take 1 tab two times a day  Labs Reviewed: Creat: 0.6 (12/20/2008)    Reviewed HgBA1c results: 6.5 (12/20/2008)  6.8 (01/15/2008)  Complete Medication List: 1)  Furosemide 40 Mg Tabs (Furosemide) .... Take 1 tab once  daily 2)  Glipizide 5 Mg Tb24 (Glipizide) .... Take 1 once daily 3)  Menest 2.5 Mg Tabs (Esterified estrogens) .... Take one tablet every other day 4)  Nexium 40 Mg Cpdr (Esomeprazole magnesium) .... Take 1 once daily 5)  Quinapril Hcl 20 Mg Tabs (Quinapril hcl) .... Take 1 tab once daily 6)  Vytorin 10-40 Mg Tabs (Ezetimibe-simvastatin) .... Take 1 tab once daily 7)  Metformin Hcl 1000 Mg Tabs (Metformin hcl) .... Take 1  tab two times a day 8)  Tylenol Arthritis Pain 650 Mg Cr-tabs (Acetaminophen) .... Take 1 tab two times a day as needed 9)  Prozac 20 Mg Caps (Fluoxetine hcl) .... 3 by mouth once daily 10)  Freestyle Lite Test Strp (Glucose blood) .... Use one strip twice daily. 11)  Freestyle Unistick Ii Lancets Misc (Lancets) .... Use one lancet twice daily. Prescriptions: FREESTYLE UNISTICK II LANCETS  MISC (LANCETS) Use one lancet twice daily.  #180 x 3   Entered by:   Ardyth Man   Authorized by:   Loreen Freud DO   Signed by:   Ardyth Man on 05/15/2009   Method used:   Faxed to ...       [facility]* (mail-order)             ,          Ph: [phone]       Fax: [phone]   RxID:   [id number] FREESTYLE LITE TEST  STRP (GLUCOSE BLOOD) Use one strip twice daily.  #180 x 3   Entered by:   Ardyth Man   Authorized by:   Loreen Freud DO   Signed by:   Ardyth Man on 05/15/2009   Method used:   Faxed to ...       [facility]* (mail-order)             ,          Ph: [phone]       Fax: [phone]   RxID:   [id number]

## 2010-12-01 NOTE — Progress Notes (Signed)
Summary: stomach   Phone Note Call from Patient   Caller: Patient Reason for Call: Acute Illness Summary of Call: dr. Blossom Hoops [phone] [facility] she has a black and blue mark on her stomach. and would like to know what to do? she would rather see dr. Blossom Hoops.  Initial call taken by: Charolette Child,  April 18, 2007 11:44 AM   Additional Follow-up for Phone Call Additional follow up Details #2::    SPOKE WITH PT OV SCHED TOMORROW WITH DR Laury Axon Follow-up by: Kandice Hams,  April 18, 2007 12:13 PM

## 2010-12-01 NOTE — Letter (Signed)
Summary: [facility]   Imported By: Lanelle Bal 07/29/2009 09:17:06  _____________________________________________________________________  External Attachment:    Type:   Image     Comment:   External Document

## 2010-12-01 NOTE — Progress Notes (Signed)
Summary: RX  Phone Note Refill Request Call back at [phone] Message from:  Patient on September 02, 2010 8:34 AM  Refills Requested: Medication #1:  VICTOZA 18 MG/3ML SOLN Administer 1.8 ml daily   Dosage confirmed as above?Dosage Confirmed   Supply Requested: 3 months PT IS CALLING WANTING A NEW SCRIPT FOR 1.8 MG TO BE SENT TO [facility] BECAUSE IT WAS CHANGE ON THE LAST VISIT  Initial call taken by: Freddy Jaksch,  September 02, 2010 8:34 AM    Prescriptions: VICTOZA 18 MG/3ML SOLN (LIRAGLUTIDE) Administer 1.8 ml daily  #2 Not Specif x 1   Entered by:   Almeta Monas CMA (AAMA)   Authorized by:   Loreen Freud DO   Signed by:   Almeta Monas CMA (AAMA) on 09/02/2010   Method used:   Faxed to ...       [facility] ([facility])             , [place]         Ph: [phone]       Fax: [phone]   RxID:   [id number]

## 2010-12-01 NOTE — Letter (Signed)
Summary: Results Follow up Letter  [facility] [place]  [address] [place], [place] [address]   Phone: [phone]  Fax: [phone]    08/11/2009 MRN: [id number]  SAYA MCCOLL [address] [place], [place]  [address]  Dear Ms. Markwood,  The following are the results of your recent test(s):  Test         Result    Pap Smear:        Normal _____  Not Normal _____ Comments: ______________________________________________________ Cholesterol: LDL(Bad cholesterol):         Your goal is less than:         HDL (Good cholesterol):       Your goal is more than: Comments:  ______________________________________________________ Mammogram:        Normal _____  Not Normal _____ Comments:  ___________________________________________________________________ Hemoccult:        Normal _____  Not normal _______ Comments:    _____________________________________________________________________ Other Tests:  See attachment for results.    We routinely do not discuss normal results over the telephone.  If you desire a copy of the results, or you have any questions about this information we can discuss them at your next office visit.   Sincerely,    Army Fossa CMA  August 11, 2009 8:44 AM

## 2010-12-01 NOTE — Letter (Signed)
Summary: Results Follow up Letter  [facility] [place]  [address] [place], [place] [address]   Phone: [phone]  Fax: [phone]    08/12/2009 MRN: [id number]  JERLEAN PERALTA [address] [place], [place]  [address]  Dear Ms. Tornow,  The following are the results of your recent test(s):  Test         Result    Pap Smear:        Normal _____  Not Normal _____ Comments: ______________________________________________________ Cholesterol: LDL(Bad cholesterol):         Your goal is less than:         HDL (Good cholesterol):       Your goal is more than: Comments:  ______________________________________________________ Mammogram:        Normal _____  Not Normal _____ Comments:  ___________________________________________________________________ Hemoccult:        Normal __X___  Not normal _______ Comments:    _____________________________________________________________________ Other Tests:    We routinely do not discuss normal results over the telephone.  If you desire a copy of the results, or you have any questions about this information we can discuss them at your next office visit.   Sincerely,     Appended Document: Results Follow up Letter new letter to follow.

## 2010-12-01 NOTE — Progress Notes (Signed)
  Phone Note Outgoing Call Call back at [facility] Phone [phone]   Call placed by: Ardyth Man,  July 05, 2007 12:20 PM Call placed to: Patient Summary of Call: [facility] w/ female ...................................................................Ardyth Man  July 05, 2007 12:20 PM   Follow-up for Phone Call        Pt. aware ...................................................................Ardyth Man  July 05, 2007 1:18 PM  Follow-up by: Ardyth Man,  July 05, 2007 1:18 PM

## 2010-12-01 NOTE — Progress Notes (Signed)
Summary: refill  Phone Note Refill Request   Refills Requested: Medication #1:  VYTORIN 10-40 MG TABS via fax [facility]  Initial call taken by: Charolette Child,  October 16, 2008 1:29 PM      Prescriptions: VYTORIN 10-40 MG TABS (EZETIMIBE-SIMVASTATIN)   #90 x 0   Entered by:   Kandice Hams   Authorized by:   Loreen Freud DO   Signed by:   Kandice Hams on 10/16/2008   Method used:   Faxed to ...       [facility]* (mail-order)             ,          Ph: [phone]       Fax: [phone]   RxID:   [id number]

## 2010-12-01 NOTE — Progress Notes (Signed)
Summary: NEEDS PRESCRIP FOR **20 MG** FLUOXETINE   Phone Note Call from Patient Call back at Home Phone [phone]   Caller: Patient Reason for Call: Refill Medication Summary of Call: DR Blossom Hoops PATIENT  SAYS SHE CALLED [facility] TO INQUIRE ABOUT HER REFILL REQUEST FOR THE FLUOXETINE 20 MG SINCE SHE HAD RECEIVED HER SHIPMENT OF THE 40 MG STRENGTH AND WAS TOLD THAT THE REORDER DATE HAD EXPIRED THE DAY BEFORE  SO SHE HAS NOT BEEN TAKING THE 20 MG OF FLUOXETINEN FOR A WEEK--SHE HAS BEEN TAKING THE 40 MG STRENGTH--NEEDS TWO PRESCRIPTIONS--ONE FOR 30 DAY FL[facility]OXETINE 20 MG [facility] [address] [place]  AND ONE FOR A NINETY DAY PRESCRIPTION FOR FLUOXETINE 20 MG WITH REFILLS  PLEASE LET HER KNOW WHEN THE 30 DAY PRESC HAS BEEN CALLED IN Initial call taken by: Jerolyn Shin,  October 30, 2007 11:11 AM  Follow-up for Phone Call        Spoke with patient and mailed order for 20 mg fluoxetine and sent in a 30 days supply [facility]. ...................................................................Ardyth Man  October 30, 2007 11:56 AM  Follow-up by: Ardyth Man,  October 30, 2007 11:56 AM    New/Updated Medications: FLUOXETINE HCL 20 MG CAPS (FLUOXETINE HCL) Take one tablet daily   Prescriptions: FLUOXETINE HCL 20 MG CAPS (FLUOXETINE HCL) Take one tablet daily  #90 x 3   Entered by:   Ardyth Man   Authorized by:   Marga Melnick MD   Signed by:   Ardyth Man on 10/30/2007   Method used:   Print then Give to Patient   RxID:   [id number] FLUOXETINE HCL 20 MG CAPS (FLUOXETINE HCL) Take one tablet daily  #30 x 0   Entered by:   Ardyth Man   Authorized by:   Leanne Chang MD   Signed by:   Ardyth Man on 10/30/2007   Method used:   [facility] ...       [facility] [address] [phone]*       [address]       [place], [place]  [address]       Ph: [phone] or [phone]       Fax: [phone]  RxID:   [id number]

## 2010-12-01 NOTE — Progress Notes (Signed)
Summary: ALEJANDRO-QUINAPRIL HCL 20MG ,NEXIUM 40MG   Phone Note Refill Request   Refills Requested: Medication #1:  QUINAPRIL HCL 20 MG TABS TAKE ONE TABLET DAILY  Medication #2:  NEXIUM 40 MG CPDR TAKE ONE CAPSULE DAILY RECD BY FAX [facility] ***FAX TO FOLLOW*** [phone]  Initial call taken by: Gwen Pounds,  September 25, 2007 10:08 AM  Follow-up for Phone Call        See phone note for same day ...................................................................Ardyth Man  September 25, 2007 10:48 AM  Follow-up by: Ardyth Man,  September 25, 2007 10:48 AM

## 2010-12-01 NOTE — Progress Notes (Signed)
Summary: PT CONFUSED ABOUT NEW MEDS  Phone Note Call from Patient Call back at Home Phone [phone]   Caller: Patient Call For: Andrea Freud DO Reason for Call: Talk to Nurse Summary of Call: PT CALLING, STATING THE MILOGRAMS ON HER NEW MEDICATIONS DO NOT MATCH THE MG'S OF THE PREVIOUS MEDS SHE WAS TAKING.  FIRST NEW MED IS RANITIDINE, 2ND NEW MED IS GLIMEPIRIDE.  PATIENT SEEMS CONFUSED ABOUT WHAT SHE IS TAKING, WOULD LIKE TO SPEAK W/NURSE. Initial call taken by: Magdalen Spatz [facility],  December 19, 2009 1:48 PM  Follow-up for Phone Call        Spoke with pt and clarified med changes. Army Fossa CMA  December 19, 2009 4:48 PM

## 2010-12-03 NOTE — Progress Notes (Signed)
Summary: Results lmom 1/25--pat called back  Phone Note Outgoing Call   Call placed by: Almeta Monas CMA Duncan Dull),  November 25, 2010 2:57 PM Call placed to: Patient Details for Reason: + UTI---macrobid 1 by mouth two times a day for 7 days   Summary of Call: Left message to call back... Almeta Monas CMA Duncan Dull)  November 25, 2010 2:57 PM    Follow-up for Phone Call        spoke w/ patient aware of labs ....Marland KitchenMarland KitchenDoristine Devoid CMA  November 25, 2010 4:36 PM     New/Updated Medications: MACROBID 100 MG CAPS (NITROFURANTOIN MONOHYD MACRO) take one tablet two times a day x7 days Prescriptions: MACROBID 100 MG CAPS (NITROFURANTOIN MONOHYD MACRO) take one tablet two times a day x7 days  #14 x 0   Entered by:   Doristine Devoid CMA   Authorized by:   Loreen Freud DO   Signed by:   Doristine Devoid CMA on 11/25/2010   Method used:   [facility]        [facility] [address] [phone]* (retail)       [address]       [place], [place]  [address]       Ph: [phone] or [phone]       Fax: [phone]   RxID:   [id number]

## 2010-12-03 NOTE — Letter (Signed)
Summary: Primary Care Appointment Letter  [facility] [place]  [address] [place], [place] [address]   Phone: [phone]  Fax: [phone]    11/17/2010 MRN: [id number]    Andrea Marks [address] [place], [place]  [address]    Dear [name],    Your Primary Care Physician Loreen Freud DO has indicated that:    __X____it is time to schedule an appointment for and Annual Physical with fast labs hgba1c, bmp, lipid, hep, microalbumin 272.4  250.00.    _______you missed your appointment on______ and need to call and          reschedule.    _______you need to have lab work done.    _______you need to schedule an appointment discuss lab or test results.    _______you need to call to reschedule your appointment that is                       scheduled on _________.     Please call our office as soon as possible. Our phone number is [phone]. Please press option 1. Our office is open 8a-5p, Monday through Friday.     Thank you,     Primary Care Scheduler

## 2011-01-07 ENCOUNTER — Encounter (INDEPENDENT_AMBULATORY_CARE_PROVIDER_SITE_OTHER): Payer: Self-pay | Admitting: *Deleted

## 2011-01-12 NOTE — Letter (Signed)
Summary: Primary Care Appointment Letter  [facility] [place]  [address] [place], [place] [address]   Phone: [phone]  Fax: [phone]    01/07/2011 MRN: [id number]     Andrea Marks [address] [place], [place]  [address]       Dear [name],   Your Primary Care Physician Loreen Freud DO has indicated that:    ___x____it is time to schedule an appointment for your annual physical.    _______you missed your appointment on______ and need to call and          reschedule.    _______you need to have lab work done.    _______you need to schedule an appointment discuss lab or test results.    _______you need to call to reschedule your appointment that is                       scheduled on _________.     Please call our office as soon as possible. Our phone number is [phone]. Please press option 1. Our office is open 8a-5p, Monday through Friday.     Thank you, [facility] Primary Care Scheduler

## 2011-02-11 ENCOUNTER — Other Ambulatory Visit: Payer: Self-pay | Admitting: Family Medicine

## 2011-02-12 ENCOUNTER — Other Ambulatory Visit: Payer: Self-pay | Admitting: Family Medicine

## 2011-02-16 ENCOUNTER — Encounter: Payer: Self-pay | Admitting: Family Medicine

## 2011-02-17 ENCOUNTER — Encounter: Payer: Self-pay | Admitting: Family Medicine

## 2011-02-17 ENCOUNTER — Ambulatory Visit (INDEPENDENT_AMBULATORY_CARE_PROVIDER_SITE_OTHER): Payer: 59 | Admitting: Family Medicine

## 2011-02-17 VITALS — BP 116/60 | HR 76 | Ht 64.0 in | Wt 315.2 lb

## 2011-02-17 DIAGNOSIS — I10 Essential (primary) hypertension: Secondary | ICD-10-CM

## 2011-02-17 DIAGNOSIS — Z1239 Encounter for other screening for malignant neoplasm of breast: Secondary | ICD-10-CM

## 2011-02-17 DIAGNOSIS — E119 Type 2 diabetes mellitus without complications: Secondary | ICD-10-CM

## 2011-02-17 DIAGNOSIS — Z1231 Encounter for screening mammogram for malignant neoplasm of breast: Secondary | ICD-10-CM

## 2011-02-17 DIAGNOSIS — Z131 Encounter for screening for diabetes mellitus: Secondary | ICD-10-CM

## 2011-02-17 DIAGNOSIS — Z0000 Encounter for general adult medical examination without abnormal findings: Secondary | ICD-10-CM

## 2011-02-17 DIAGNOSIS — E785 Hyperlipidemia, unspecified: Secondary | ICD-10-CM

## 2011-02-17 LAB — CBC WITH DIFFERENTIAL/PLATELET
Basophils Absolute: 0 10*3/uL (ref 0.0–0.1)
Basophils Relative: 0.5 % (ref 0.0–3.0)
Eosinophils Absolute: 0.1 10*3/uL (ref 0.0–0.7)
Eosinophils Relative: 1.3 % (ref 0.0–5.0)
HCT: 41.8 % (ref 36.0–46.0)
Hemoglobin: 13.9 g/dL (ref 12.0–15.0)
Lymphocytes Relative: 13.1 % (ref 12.0–46.0)
Lymphs Abs: 1.2 10*3/uL (ref 0.7–4.0)
MCHC: 33.2 g/dL (ref 30.0–36.0)
MCV: 83.5 fl (ref 78.0–100.0)
Monocytes Absolute: 0.4 10*3/uL (ref 0.1–1.0)
Monocytes Relative: 4.3 % (ref 3.0–12.0)
Neutro Abs: 7.6 10*3/uL (ref 1.4–7.7)
Neutrophils Relative %: 80.8 % — ABNORMAL HIGH (ref 43.0–77.0)
Platelets: 203 10*3/uL (ref 150.0–400.0)
RBC: 5 Mil/uL (ref 3.87–5.11)
RDW: 15.3 % — ABNORMAL HIGH (ref 11.5–14.6)
WBC: 9.4 10*3/uL (ref 4.5–10.5)

## 2011-02-17 LAB — BASIC METABOLIC PANEL WITH GFR
BUN: 18 mg/dL (ref 6–23)
CO2: 28 meq/L (ref 19–32)
Calcium: 9.4 mg/dL (ref 8.4–10.5)
Chloride: 103 meq/L (ref 96–112)
Creatinine, Ser: 0.8 mg/dL (ref 0.4–1.2)
GFR: 83.78 mL/min
Glucose, Bld: 187 mg/dL — ABNORMAL HIGH (ref 70–99)
Potassium: 4.6 meq/L (ref 3.5–5.1)
Sodium: 140 meq/L (ref 135–145)

## 2011-02-17 LAB — LIPID PANEL
Cholesterol: 146 mg/dL (ref 0–200)
HDL: 70.8 mg/dL (ref >39.00)
LDL Cholesterol: 51 mg/dL (ref 0–99)
Total CHOL/HDL Ratio: 2
Triglycerides: 120 mg/dL (ref 0.0–149.0)
VLDL: 24 mg/dL (ref 0.0–40.0)

## 2011-02-17 LAB — HEPATIC FUNCTION PANEL
ALT: 15 U/L (ref 0–35)
AST: 19 U/L (ref 0–37)
Albumin: 3.2 g/dL — ABNORMAL LOW (ref 3.5–5.2)
Alkaline Phosphatase: 49 U/L (ref 39–117)
Bilirubin, Direct: 0.1 mg/dL (ref 0.0–0.3)
Total Bilirubin: 0.2 mg/dL — ABNORMAL LOW (ref 0.3–1.2)
Total Protein: 6.3 g/dL (ref 6.0–8.3)

## 2011-02-17 LAB — HEMOGLOBIN A1C: Hgb A1c MFr Bld: 7.8 % — ABNORMAL HIGH (ref 4.6–6.5)

## 2011-02-17 MED ORDER — INSULIN PEN NEEDLE 32G X 6 MM MISC: Status: DC

## 2011-02-17 MED ORDER — LIRAGLUTIDE 18 MG/3ML ~~LOC~~ SOLN: SUBCUTANEOUS | Status: DC

## 2011-02-17 NOTE — Patient Instructions (Signed)
Calorie Counting Diet A calorie counting diet requires you to eat the number of calories that are right for you during a day. Calories are the measurement of how much energy you get from the food you eat. Eating the right amount of calories is important for staying at a healthy weight. If you eat too many calories your body will store them as fat and you may gain weight. If you eat too few calories you may lose weight. Counting the number of calories that you eat during a day will help you to know if you're eating the right amount. A Registered Dietitian can determine how many calories you need in a day. The amount of calories you need varies from person to person. If your goal is to lose weight you will need to eat fewer calories. Losing weight can benefit you if you are overweight or have health problems such as heart disease, high blood pressure or diabetes. If your goal is to gain weight, you will need to eat more calories. Gaining weight may be necessary if you have a certain health problem that causes your body to need more energy. TIPS Whether you are increasing or decreasing the number of calories you eat during a day, it may be hard to get used to changing what you eat and drink. The following are tips to help you keep track of the number of calories you are eating.  Measuring foods at home with measuring cups will help you to know the actual amount of food and number of calories you are eating.   Restaurants serve food in all different portion sizes. It is common that restaurants will serve food in amounts worth 2 or more serving sizes. While eating out, it may be helpful to estimate how many servings of a food you are given. For example, a serving of cooked rice is 1/2 cup and that is the size of half of a fist. Knowing serving sizes will help you have a better idea of how much food you are eating at restaurants.   Ask for smaller portion sizes or child-size portions at restaurants.   Plan to  eat half of a meal at a restaurant and take the rest home or share the other half with a friend   Read food labels for calorie content and serving size   Most packaged food has a Nutrition Facts Panel on its side or back. Here you can find out how many servings are in a package, the size of a serving, and the number of calories each serving has.   The serving size and number of servings per container are listed right below the Nutrition Facts heading. Just below the serving information, the number of calories in each serving is listed.   For example, say that a package has three cookies inside. The Nutrition Facts panel says that one serving is one cookie. Below that, it says that there are three servings in the container. The calories section of the Nutrition Facts says there are 90 calories. That means that there are 90 calories in one cookie. If you eat one cookie you have eaten 90 calories. If you eat all three cookies, you have eaten three times that amount, or 270 calories.  The list below tells you how big or small some common portion sizes are.  1 ounce (oz).................4 stacked dice.   3 oz..............................Deck of cards.   1 teaspoon (tsp)...........Tip of little finger.   1 tablespoon (Tbsp)....Tip of thumb.     2 Tbsp..........................Golf ball.    Cup..........................Half of a fist.   1 Cup...........................A fist.  KEEP A FOOD LOG Write down every food item that you eat, how much of the food you eat, and the number of calories in each food that you eat during the day. At the end of the day or throughout the day you can add up the total number of calories you have eaten.  It may help to set up a list like the one below. Find out the calorie information by reading food labels.  Breakfast   Bran Flakes (1 cup, 110 calories).   Fat free milk ( cup, 45 calories).   Snack   Apple (1 medium, 80 calories).   Lunch   Spinach (1  cup, 20 calories).   Tomato ( medium, 20 calories).   Chicken breast strips (3 oz, 165 calories).   Shredded cheddar cheese ( cup, 110 calories).   Light Italian dressing (2 Tbsp, 60 calories).   Whole wheat bread (1 slice, 80 calories).   Tub margarine (1 tsp, 35 calories).   Vegetable soup (1 cup, 160 calories).   Dinner   Pork chop (3 oz, 190 calories).   Brown rice (1 cup, 215 calories).   Steamed broccoli ( cup, 20 calories).   Strawberries (1  cup, 65 calories).   Whipped cream (1 Tbsp, 50 calories).  Daily Calorie Total: 1425 Information from www.eatright.org, Foodwise Nutritional Analysis Database. Document Released: 10/18/2005 Document Re-Released: 11/09/2009 ExitCare Patient Information 2011 [employer]. 

## 2011-02-17 NOTE — Progress Notes (Signed)
Addended by: Floydene Flock on: 02/17/2011 10:47 AM   Modules accepted: Orders

## 2011-02-17 NOTE — Progress Notes (Signed)
  Subjective:     Tashiba Timoney is a 60 y.o. female and is here for a comprehensive physical exam. The patient reports problems - problems with weight loss---she has been gaining secondary to stress.  History   Social History  . Marital Status: Married    Spouse Name: N/A    Number of Children: 0  . Years of Education: 13   Occupational History  . retired from [employer]    Social History Main Topics  . Smoking status: Former Games developer  . Smokeless tobacco: Never Used  . Alcohol Use: No  . Drug Use: No  . Sexually Active: Yes -- Female partner(s)   Other Topics Concern  . Not on file   Social History Narrative  . No narrative on file   Health Maintenance  Topic Date Due  . Mammogram  02/18/2001  . Influenza Vaccine  08/02/2011  . Tetanus/tdap  05/07/2013  . Pap Smear  02/16/2014  . Colonoscopy  02/16/2021    The following portions of the patient's history were reviewed and updated as appropriate: allergies, current medications, past family history, past medical history, past social history, past surgical history and problem list.  Review of Systems A comprehensive review of systems was negative except for: Behavioral/Psych: positive for anxiety and obesity   Objective:    BP 116/60  Pulse 76  Ht 5\' 4"  (1.626 m)  Wt 315 lb 3.2 oz (142.974 kg)  BMI 54.10 kg/m2 General appearance: alert, cooperative, appears stated age, no distress and morbidly obese Head: Normocephalic, without obvious abnormality, atraumatic Eyes: conjunctivae/corneas clear. PERRL, EOM's intact. Fundi benign. Ears: normal TM's and external ear canals both ears Nose: Nares normal. Septum midline. Mucosa normal. No drainage or sinus tenderness. Throat: lips, mucosa, and tongue normal; teeth and gums normal Neck: no adenopathy, no carotid bruit, no JVD, supple, symmetrical, trachea midline and thyroid not enlarged, symmetric, no tenderness/mass/nodules Lungs: clear to auscultation bilaterally Breasts:  normal appearance, no masses or tenderness Heart: regular rate and rhythm, S1, S2 normal, no murmur, click, rub or gallop Abdomen: soft, non-tender; bowel sounds normal; no masses,  no organomegaly and ventral hernia Pelvic: declined Extremities: edema +1 edema b/l LE Pulses: 2+ and symmetric Skin: Skin color, texture, turgor normal. No rashes or lesions Lymph nodes: Cervical, supraclavicular, and axillary nodes normal. Neurologic: Grossly normal    Assessment:    Healthy female exam.      Plan:  Pt refusing colon Schedule mammo Ca and vita D daily Other ghm utd   See After Visit Summary for Counseling Recommendations

## 2011-02-17 NOTE — Assessment & Plan Note (Signed)
con't meds Check labs  con't reg eye dr appointments and podiatry

## 2011-02-17 NOTE — Progress Notes (Signed)
Addended byCandie Echevaria on: 02/17/2011 10:34 AM   Modules accepted: Orders

## 2011-02-17 NOTE — Assessment & Plan Note (Signed)
con't meds  Check labs 

## 2011-02-18 ENCOUNTER — Telehealth: Payer: Self-pay | Admitting: *Deleted

## 2011-02-18 MED ORDER — METFORMIN HCL 850 MG PO TABS: 850.0000 mg | ORAL_TABLET | Freq: Three times a day (TID) | ORAL | Status: DC

## 2011-02-18 NOTE — Telephone Encounter (Addendum)
Left message to call office  Message copied by [facility] on Thu Feb 18, 2011  9:21 AM ------      Message from: Loreen Freud      Created: Wed Feb 17, 2011  2:32 PM       DM not controlled------increase glucophage 850 mg 1 po tid #90  2 refills      con't all other meds      Recheck 3 months    250.00,  272.4  Lipid, hep, bmp, hgba1c, microalbumin, ua

## 2011-02-18 NOTE — Telephone Encounter (Signed)
Pt aware, Rx sent to pharmacy

## 2011-02-23 ENCOUNTER — Encounter (INDEPENDENT_AMBULATORY_CARE_PROVIDER_SITE_OTHER): Payer: 59 | Admitting: *Deleted

## 2011-02-23 ENCOUNTER — Other Ambulatory Visit: Payer: Self-pay | Admitting: *Deleted

## 2011-02-23 ENCOUNTER — Ambulatory Visit (INDEPENDENT_AMBULATORY_CARE_PROVIDER_SITE_OTHER): Payer: 59 | Admitting: Family Medicine

## 2011-02-23 ENCOUNTER — Other Ambulatory Visit: Payer: Self-pay | Admitting: Family Medicine

## 2011-02-23 ENCOUNTER — Ambulatory Visit: Payer: 59

## 2011-02-23 VITALS — BP 120/70 | Temp 98.7°F | Wt 314.1 lb

## 2011-02-23 DIAGNOSIS — M79609 Pain in unspecified limb: Secondary | ICD-10-CM

## 2011-02-23 DIAGNOSIS — M7989 Other specified soft tissue disorders: Secondary | ICD-10-CM

## 2011-02-23 DIAGNOSIS — I803 Phlebitis and thrombophlebitis of lower extremities, unspecified: Secondary | ICD-10-CM | POA: Insufficient documentation

## 2011-02-23 NOTE — Patient Instructions (Signed)
Please follow up in 7 days to recheck area on leg We'll notify you of your ultrasound results Apply heat or ice for pain relief Ibuprofen 400 mg (2 tabs) every 6-8 hrs for pain and inflammation Continue to walk normally- this will improve the swelling Elevate your leg when sitting Hang in there!

## 2011-02-23 NOTE — Assessment & Plan Note (Signed)
Area on pt's lower leg consistent w/ superficial thrombophlebitis.  Must r/o DVT w/ venous dopplers.  Pt to apply heat or ice for pain relief, NSAIDs prn.  Reviewed supportive care and red flags that should prompt return.  Pt expressed understanding and is in agreement w/ plan.

## 2011-02-23 NOTE — Progress Notes (Signed)
  Subjective:    Patient ID: Journee Bobrowski, female    DOB: Mar 08, 1951, 60 y.o.   MRN: [id number]  HPI R leg pain- woke up yesterday morning w/ pain and swelling of R lower leg.  + bruising.  Not warm to touch.  No recent travel.  No known injury.  Some throbbing.  Taking estrogen.  No recent med changes.  Denies other lumps or bruising.   Review of Systems For ROS see HPI     Objective:   Physical Exam  Constitutional: She appears well-developed and well-nourished.       Morbidly obese  Musculoskeletal:       4 cm area of swelling on R lower, posterior medial calf.  + bruising, small area of firmness along superficial vessel, + TTP. (-) Homans No ankle edema          Assessment & Plan:

## 2011-02-24 ENCOUNTER — Ambulatory Visit
Admission: RE | Admit: 2011-02-24 | Discharge: 2011-02-24 | Disposition: A | Discharge status: Home / Self Care | Payer: 59 | Source: Ambulatory Visit | Attending: Family Medicine | Admitting: Family Medicine

## 2011-02-24 DIAGNOSIS — Z1231 Encounter for screening mammogram for malignant neoplasm of breast: Secondary | ICD-10-CM

## 2011-02-26 ENCOUNTER — Encounter: Payer: Self-pay | Admitting: Family Medicine

## 2011-03-02 ENCOUNTER — Ambulatory Visit (INDEPENDENT_AMBULATORY_CARE_PROVIDER_SITE_OTHER): Payer: 59 | Admitting: Family Medicine

## 2011-03-02 DIAGNOSIS — I803 Phlebitis and thrombophlebitis of lower extremities, unspecified: Secondary | ICD-10-CM

## 2011-03-02 NOTE — Patient Instructions (Signed)
Your leg looks much better! Continue the ice or heat as needed for pain relief Take tylenol or ibuprofen as needed for pain (take w/ food to avoid upset stomach) Continue to do your regular activities w/out limitation Call with any questions or concerns [place]!!!

## 2011-03-02 NOTE — Assessment & Plan Note (Signed)
Pt's leg and sxs are much improved from previous.  Continue ice/heat, tylenol/ibuprofen for pain relief as needed.

## 2011-03-02 NOTE — Progress Notes (Signed)
  Subjective:    Patient ID: Andrea Marks, female    DOB: Mar 14, 1951, 60 y.o.   MRN: [id number]  HPI Thrombophlebitis- bruising and swelling have improved.  Area still bothersome to pt- 'it aches'.  Stopped taking ibuprofen after 2 days due to stomach upset (not sure if she was taking it on empty stomach or not).   Review of Systems For ROS see HPI     Objective:   Physical Exam  Constitutional: She appears well-developed and well-nourished. No distress.       Morbidly obese  Musculoskeletal: She exhibits no edema.       No bruising or swelling of R posterior/medial calf.  Mild superficial induration and tenderness.          Assessment & Plan:

## 2011-03-28 ENCOUNTER — Other Ambulatory Visit: Payer: Self-pay | Admitting: Family Medicine

## 2011-05-04 ENCOUNTER — Other Ambulatory Visit: Payer: Self-pay | Admitting: Family Medicine

## 2011-06-08 ENCOUNTER — Encounter: Payer: Self-pay | Admitting: Family Medicine

## 2011-06-08 ENCOUNTER — Ambulatory Visit (INDEPENDENT_AMBULATORY_CARE_PROVIDER_SITE_OTHER): Payer: 59 | Admitting: Family Medicine

## 2011-06-08 VITALS — BP 150/92 | HR 94 | Temp 98.3°F | Wt 315.2 lb

## 2011-06-08 DIAGNOSIS — E785 Hyperlipidemia, unspecified: Secondary | ICD-10-CM

## 2011-06-08 DIAGNOSIS — I10 Essential (primary) hypertension: Secondary | ICD-10-CM

## 2011-06-08 DIAGNOSIS — E119 Type 2 diabetes mellitus without complications: Secondary | ICD-10-CM

## 2011-06-08 DIAGNOSIS — Z0000 Encounter for general adult medical examination without abnormal findings: Secondary | ICD-10-CM

## 2011-06-08 DIAGNOSIS — Z23 Encounter for immunization: Secondary | ICD-10-CM

## 2011-06-08 LAB — CBC WITH DIFFERENTIAL/PLATELET
Basophils Absolute: 0 10*3/uL (ref 0.0–0.1)
Basophils Relative: 0.5 % (ref 0.0–3.0)
Eosinophils Absolute: 0.2 10*3/uL (ref 0.0–0.7)
Eosinophils Relative: 2.3 % (ref 0.0–5.0)
HCT: 41.2 % (ref 36.0–46.0)
Hemoglobin: 13.3 g/dL (ref 12.0–15.0)
Lymphocytes Relative: 13.8 % (ref 12.0–46.0)
Lymphs Abs: 1.2 10*3/uL (ref 0.7–4.0)
MCHC: 32.4 g/dL (ref 30.0–36.0)
MCV: 83.5 fl (ref 78.0–100.0)
Monocytes Absolute: 0.7 10*3/uL (ref 0.1–1.0)
Monocytes Relative: 7.6 % (ref 3.0–12.0)
Neutro Abs: 6.7 10*3/uL (ref 1.4–7.7)
Neutrophils Relative %: 75.8 % (ref 43.0–77.0)
Platelets: 194 10*3/uL (ref 150.0–400.0)
RBC: 4.93 Mil/uL (ref 3.87–5.11)
RDW: 14.7 % — ABNORMAL HIGH (ref 11.5–14.6)
WBC: 8.8 10*3/uL (ref 4.5–10.5)

## 2011-06-08 LAB — BASIC METABOLIC PANEL
BUN: 16 mg/dL (ref 6–23)
CO2: 29 mEq/L (ref 19–32)
Calcium: 9.4 mg/dL (ref 8.4–10.5)
Chloride: 102 mEq/L (ref 96–112)
Creatinine, Ser: 0.6 mg/dL (ref 0.4–1.2)
GFR: 112.59 mL/min (ref >60.00)
Glucose, Bld: 143 mg/dL — ABNORMAL HIGH (ref 70–99)
Potassium: 4.7 mEq/L (ref 3.5–5.1)
Sodium: 143 mEq/L (ref 135–145)

## 2011-06-08 LAB — HEPATIC FUNCTION PANEL
ALT: 9 U/L (ref 0–35)
AST: 13 U/L (ref 0–37)
Albumin: 3.8 g/dL (ref 3.5–5.2)
Alkaline Phosphatase: 55 U/L (ref 39–117)
Bilirubin, Direct: 0 mg/dL (ref 0.0–0.3)
Total Bilirubin: 0.6 mg/dL (ref 0.3–1.2)
Total Protein: 7.1 g/dL (ref 6.0–8.3)

## 2011-06-08 LAB — LIPID PANEL
Cholesterol: 137 mg/dL (ref 0–200)
HDL: 73 mg/dL (ref >39.00)
LDL Cholesterol: 43 mg/dL (ref 0–99)
Total CHOL/HDL Ratio: 2
Triglycerides: 103 mg/dL (ref 0.0–149.0)
VLDL: 20.6 mg/dL (ref 0.0–40.0)

## 2011-06-08 LAB — HEMOGLOBIN A1C: Hgb A1c MFr Bld: 6.6 % — ABNORMAL HIGH (ref 4.6–6.5)

## 2011-06-08 MED ORDER — EZETIMIBE-SIMVASTATIN 10-40 MG PO TABS: ORAL_TABLET | ORAL | Status: DC

## 2011-06-08 MED ORDER — METFORMIN HCL ER 750 MG PO TB24: ORAL_TABLET | ORAL | Status: DC

## 2011-06-08 NOTE — Patient Instructions (Signed)
Diabetes Meal Planning Guide The diabetes meal planning guide is a tool to help you plan your meals and snacks. It is important for people with diabetes to manage their blood sugar levels. Choosing the right foods and the right amounts throughout your day will help control your blood sugar. Eating right can even help you improve your blood pressure and reach or maintain a healthy weight. CARBOHYDRATE COUNTING MADE EASY When you eat carbohydrates, they turn to sugar (glucose). This raises your blood sugar level. Counting carbohydrates can help you control this level so you feel better. When you plan your meals by counting carbohydrates, you can have more flexibility in what you eat and balance your medicine with your food intake. Carbohydrate counting simply means adding up the total amount of carbohydrate grams (g) in your meals or snacks. Try to eat about the same amount at each meal. Foods with carbohydrates are listed below. Each portion below is 1 carbohydrate serving or 15 grams of carbohydrates. Ask your dietician how many grams of carbohydrates you should eat at each meal or snack. Grains and Starches 1 slice bread 1/2 English muffin or hotdog/hamburger bun 3/4 cup cold cereal (unsweetened) 1/3 cup cooked pasta or rice 1/2 cup starchy vegetables (corn, potatoes, peas, beans, winter squash) 1 tortilla (6 inches) 1/4 bagel 1 waffle or pancake (size of a CD) 1/2 cup cooked cereal 4 to 6 small crackers *Whole grain is recommended Fruit 1 cup fresh unsweetened berries, melon, papaya, pineapple 1 small fresh fruit 1/2 banana or mango 1/2 cup fruit juice (4 ounces unsweetened) 1/2 cup canned fruit in natural juice or water 2 tablespoons dried fruit 12 to 15 grapes or cherries Milk and Yogurt 1 cup fat-free or 1% milk 1 cup soy milk 6 ounces light yogurt with sugar-free sweetener 6 ounces low-fat soy yogurt 6 ounces plain yogurt Vegetables 1 cup raw or 1/2 cup cooked is counted as 0  carbohydrates or a "free" food. If you eat 3 or more servings at one meal, count them as 1 carbohydrate serving. Other Carbohydrates 3/4 ounces chips or pretzels 1/2 cup ice cream or frozen yogurt 1/4 cup sherbet or sorbet 2 inch square cake, no frosting 1 tablespoon honey, sugar, jam, jelly, or syrup 2 small cookies 3 squares of graham crackers 3 cups popcorn 6 crackers 1 cup broth-based soup Count 1 cup casserole or other mixed foods as 2 carbohydrate servings. Foods with less than 20 calories in a serving may be counted as 0 carbohydrates or a "free" food. You may want to purchase a book or computer software that lists the carbohydrate gram counts of different foods. In addition, the nutrition facts panel on the labels of the foods you eat are a good source of this information. The label will tell you how big the serving size is and the total number of carbohydrate grams you will be eating per serving. Divide this number by 15 to obtain the number of carbohydrate servings in a portion. Remember: 1 carbohydrate serving equals 15 grams of carbohydrate. SERVING SIZES Measuring foods and serving sizes helps you make sure you are getting the right amount of food. The list below tells how big or small some common serving sizes are.  1 ounce (oz) of cheese.................................4 stacked dice.   2 to 3 oz cooked meat..................................Deck of cards.   1 teaspoon (tsp)............................................Tip of little finger.   1 tablespoon (tbs).........................................Thumb.   2 tbs.............................................................Golf ball.    cup...........................................................Half of a fist.   1 cup............................................................A fist.    SAMPLE DIABETES MEAL PLAN Below is a sample meal plan that includes foods from the grain and starches, dairy, vegetable, fruit, and  meat groups. A dietician can individualize a meal plan to fit your calorie needs and tell you the number of servings needed from each food group. However, controlling the total amount of carbohydrates in your meal or snack is more important than making sure you include all of the food groups at every meal. You may interchange carbohydrate containing foods (dairy, starches, and fruits). The meal plan below is an example of a 2000 calorie diet using carbohydrate counting. This meal plan has 17 carbohydrate servings (carb choices). Breakfast 1 cup oatmeal (2 carb choices) 3/4 cup light yogurt (1 carb choice) 1 cup blueberries (1 carb choice) 1/4 cup almonds  Snack 1 large apple (2 carb choices) 1 low-fat string cheese stick  Lunch Chicken breast salad:  1 cup spinach   1/4 cup chopped tomatoes   2 oz chicken breast, sliced   2 tbs low-fat Italian dressing  12 whole-wheat crackers (2 carb choices) 12 to 15 grapes (1 carb choice) 1 cup low-fat milk (1 carb choice)  Snack 1 cup carrots 1/2 cup hummus (1 carb choice)  Dinner 3 oz broiled salmon 1 cup brown rice (3 carb choices)  Snack 1 1/2 cups steamed broccoli (1 carb choice) drizzled with 1 tsp olive oil and lemon juice 1 cup light pudding (2 carb choices)  DIABETES MEAL PLANNING WORKSHEET Your dietician can use this worksheet to help you decide how many servings of foods and what types of foods are right for you.  Breakfast Food Group and Servings Carb Choices Grain/Starches _______________________________________ Dairy ______________________________________________ Vegetable _______________________________________ Fruit _______________________________________________ Meat _______________________________________________ Fat _____________________________________________ Lunch Food Group and Servings Carb Choices Grain/Starches ________________________________________ Dairy _______________________________________________ Fruit  ________________________________________________ Meat ________________________________________________ Fat _____________________________________________ Dinner Food Group and Servings Carb Choices Grain/Starches ________________________________________ Dairy _______________________________________________ Fruit ________________________________________________ Meat ________________________________________________ Fat _____________________________________________ Snacks Food Group and Servings Carb Choices Grain/Starches ________________________________________ Dairy _______________________________________________ Vegetable ________________________________________ Fruit ________________________________________________ Meat ________________________________________________ Fat _____________________________________________ Daily Totals Starches _________________________ Vegetable __________________________ Fruit ______________________________ Dairy ______________________________ Meat ______________________________ Fat ________________________________  Document Released: 07/15/2005 Document Re-Released: 04/07/2010 Exit[employer] Patient Information 2011 [employer]. 

## 2011-06-08 NOTE — Progress Notes (Signed)
  Subjective:     Andrea Marks is a 60 y.o. female who presents for follow up of diabetes.-- HTN and cholesterol.   Current symptoms include: hyperglycemia. Patient denies hypoglycemia , increased appetite, nausea, polydipsia, polyuria, visual disturbances, vomiting and weight loss. Evaluation to date has been: fasting blood sugar, fasting lipid panel, hemoglobin A1C and microalbuminuria. Home sugars: patient does not check sugars. Current treatments: more intensive attention to diet which has been somewhat effective, Continued sulfonylurea which has been somewhat effective, Continued metformin which has been somewhat effective, Continued statin which has been somewhat effective, Continued ACE inhibitor/ARB which has been somewhat effective and Continued victoza which has been somewhat effective. Last dilated eye exam 07/2010.  The following portions of the patient's history were reviewed and updated as appropriate: allergies, current medications, past family history, past medical history, past social history, past surgical history and problem list.  Review of Systems Pertinent items are noted in HPI.    Objective:    BP 150/92  Pulse 94  Temp(Src) 98.3 F (36.8 C) (Oral)  Wt 315 lb 3.2 oz (142.974 kg)  SpO2 96% General appearance: alert, cooperative, appears stated age, mild distress and morbidly obese Eyes: conjunctivae/corneas clear. PERRL, EOM's intact. Fundi benign. Neck: no adenopathy, no carotid bruit, no JVD, supple, symmetrical, trachea midline and thyroid not enlarged, symmetric, no tenderness/mass/nodules Lungs: clear to auscultation bilaterally Heart: S1, S2 normal Extremities: edema b/l low ext and monofilament done Skin: Skin color, texture, turgor normal. No rashes or lesions Lymph nodes: Cervical, supraclavicular, and axillary nodes normal.   Sensory exam of the foot is normal, tested with the monofilament. Good pulses, no lesions or ulcers, good peripheral  pulses.   Patient was evaluated for proper footwear and sizing.  Laboratory: No components found with this basename: A1C      Assessment:    DM II HTN hyperlipidemia   Plan:    check labs con't meds

## 2011-06-09 ENCOUNTER — Other Ambulatory Visit: Payer: Self-pay | Admitting: Family Medicine

## 2011-06-17 NOTE — Progress Notes (Signed)
DM controlled Cholesterol is great! con't meds---recheck  6 months-----250.00  272.4  Lipid, hep, bmp, hgba1c, microalbumin

## 2011-06-21 ENCOUNTER — Other Ambulatory Visit: Payer: Self-pay | Admitting: *Deleted

## 2011-06-21 MED ORDER — GLUCOSE BLOOD VI STRP: ORAL_STRIP | Status: DC

## 2011-06-21 NOTE — Telephone Encounter (Signed)
Rx sent 

## 2011-07-21 ENCOUNTER — Other Ambulatory Visit: Payer: Self-pay | Admitting: Family Medicine

## 2011-07-22 ENCOUNTER — Other Ambulatory Visit: Payer: Self-pay | Admitting: Family Medicine

## 2011-08-13 ENCOUNTER — Ambulatory Visit (INDEPENDENT_AMBULATORY_CARE_PROVIDER_SITE_OTHER): Payer: 59 | Admitting: Family Medicine

## 2011-08-13 ENCOUNTER — Encounter: Payer: Self-pay | Admitting: Family Medicine

## 2011-08-13 VITALS — BP 146/90 | HR 76 | Temp 97.4°F | Wt 317.0 lb

## 2011-08-13 DIAGNOSIS — E119 Type 2 diabetes mellitus without complications: Secondary | ICD-10-CM

## 2011-08-13 DIAGNOSIS — R5381 Other malaise: Secondary | ICD-10-CM

## 2011-08-13 DIAGNOSIS — R5383 Other fatigue: Secondary | ICD-10-CM

## 2011-08-13 LAB — GLUCOSE, POCT (MANUAL RESULT ENTRY): POC Glucose: 187

## 2011-08-13 NOTE — Progress Notes (Signed)
  Subjective:    Patient ID: Andrea Marks, female    DOB: 06/24/1951, 60 y.o.   MRN: [id number]  HPI Pt here c/o fatigue.  She has been under a lot of stress and just this week received a steroid injection in her heel.  She has not checked her BS since injection.    Review of Systems As above    Objective:   Physical Exam  Constitutional: She is oriented to person, place, and time. She appears well-developed and well-nourished.  Neurological: She is alert and oriented to person, place, and time.  Skin: Skin is warm and dry.       + papules arms b/l ---+ escoriations   Psychiatric: Her behavior is normal. Judgment and thought content normal.          Assessment & Plan:  Fatigue-- check labs---if all normal,  Consider inc in depression/ stress

## 2011-08-13 NOTE — Patient Instructions (Signed)
Fatigue Fatigue is a feeling of tiredness, lack of energy, lack of motivation, or feeling tired all the time. Having enough rest, good nutrition, and reducing stress will normally reduce fatigue. Consult your caregiver if it persists. The nature of your fatigue will help your caregiver to find out its cause. The treatment is based on the cause.  CAUSES There are many causes for fatigue. Most of the time, fatigue can be traced to one or more of your habits or routines. Most causes fit into one or more of three general areas. They are: Lifestyle problems  Sleep disturbances.  Overwork.   Physical exertion.   Unhealthy habits  Poor eating habits or eating disorders   Alcohol and/or drug use   Lack of proper nutrition (malnutrition).   Psychological problems  Stress and/or anxiety problems.  Depression.   Grief.  Boredom.   Medical Problems or Conditions  Anemia.  Pregnancy.   Thyroid gland problems.   Recovery from major surgery.   Continuous pain.   Emphysema or asthma that is not well controlled   Allergic conditions.   Diabetes.   Infections (such as mononucleosis).   Obesity.   Sleep disorders, such as sleep apnea.  Heart failure or other heart-related problems.   Cancer.   Kidney disease.   Liver disease.   Effects of certain medicines such as antihistamines, cough and cold remedies, prescription pain medicines, heart and blood pressure medicines, drugs used for treatment of cancer, and some antidepressants.   SYMPTOMS The symptoms of fatigue include:   Lack of energy.  Lack of drive (motivation).   Drowsiness.  Feeling of indifference to the surroundings.   DIAGNOSIS The details of how you feel help guide your caregiver in finding out what is causing the fatigue. You will be asked about your present and past health condition. It is important to review all medicines that you take, including prescription and non-prescription items. A thorough exam  will be done. You will be questioned about your feelings, habits, and normal lifestyle. Your caregiver may suggest blood tests, urine tests, or other tests to look for common medical causes of fatigue.  TREATMENT Fatigue is treated by correcting the underlying cause. For example, if you have continuous pain or depression, treating these causes will improve how you feel. Similarly, adjusting the dose of certain medicines will help in reducing fatigue.  HOME CARE INSTRUCTIONS  Try to get the required amount of good sleep every night.   Eat a healthy and nutritious diet, and drink enough water throughout the day.   Practice ways of relaxing (including yoga or meditation).   Exercise regularly.   Make plans to change situations that cause stress. Act on those plans so that stresses decrease over time. Keep your work and personal routine reasonable.   Avoid street drugs and minimize use of alcohol.   Start taking a daily multivitamin after consulting your caregiver.  SEEK MEDICAL CARE IF:  You have persistent tiredness, which cannot be accounted for.   You have fever.   You have unintentional weight loss.   You have headaches.   You have disturbed sleep throughout the night.   You are feeling sad.   You have constipation.   You have dry skin.   You have gained weight.   You are taking any new or different medicines that you suspect are causing fatigue.   You are unable to sleep at night.   You develop any unusual swelling of your legs or other parts  of your body.  SEEK IMMEDIATE MEDICAL CARE IF:  You are feeling confused.   Your vision is blurred.   You feel faint or pass out.   You develop severe headache.   You develop severe abdominal, pelvic, or back pain.   You develop chest pain, shortness of breath, or an irregular or fast heartbeat.   You are unable to pass a normal amount of urine.   You develop abnormal bleeding such as bleeding from the rectum or you  vomit blood.   You have thoughts about harming yourself or committing suicide.   You are worried that you might harm someone else.  MAKE SURE YOU:   Understand these instructions.   Will watch your condition.   Will get help right away if you are not doing well or get worse.  REFERENCES   [employer]  http://[email or web address]-martin.[email or web address]/  [employer]  medicalance.com Document Released: 08/15/2007 Document Re-Released: 09/30/2008 [facility] Patient Information 2011 [place], [place].

## 2011-08-16 ENCOUNTER — Other Ambulatory Visit: Payer: Self-pay | Admitting: Family Medicine

## 2011-08-16 DIAGNOSIS — Z0000 Encounter for general adult medical examination without abnormal findings: Secondary | ICD-10-CM

## 2011-08-17 ENCOUNTER — Other Ambulatory Visit (INDEPENDENT_AMBULATORY_CARE_PROVIDER_SITE_OTHER): Payer: 59

## 2011-08-17 ENCOUNTER — Other Ambulatory Visit: Payer: 59

## 2011-08-17 DIAGNOSIS — Z0000 Encounter for general adult medical examination without abnormal findings: Secondary | ICD-10-CM

## 2011-08-17 LAB — POCT URINALYSIS DIPSTICK
Bilirubin, UA: NEGATIVE
Blood, UA: NEGATIVE
Glucose, UA: NEGATIVE
Ketones, UA: NEGATIVE
Leukocytes, UA: NEGATIVE
Nitrite, UA: NEGATIVE
Protein, UA: NEGATIVE
Spec Grav, UA: 1.03
Urobilinogen, UA: 0.2
pH, UA: 5

## 2011-08-17 LAB — CBC WITH DIFFERENTIAL/PLATELET
Basophils Absolute: 0 10*3/uL (ref 0.0–0.1)
Basophils Relative: 0.4 % (ref 0.0–3.0)
Eosinophils Absolute: 0.2 10*3/uL (ref 0.0–0.7)
Eosinophils Relative: 1.6 % (ref 0.0–5.0)
HCT: 42.5 % (ref 36.0–46.0)
Hemoglobin: 13.9 g/dL (ref 12.0–15.0)
Lymphocytes Relative: 13.1 % (ref 12.0–46.0)
Lymphs Abs: 1.3 10*3/uL (ref 0.7–4.0)
MCHC: 32.7 g/dL (ref 30.0–36.0)
MCV: 84.4 fl (ref 78.0–100.0)
Monocytes Absolute: 0.6 10*3/uL (ref 0.1–1.0)
Monocytes Relative: 5.9 % (ref 3.0–12.0)
Neutro Abs: 7.8 10*3/uL — ABNORMAL HIGH (ref 1.4–7.7)
Neutrophils Relative %: 79 % — ABNORMAL HIGH (ref 43.0–77.0)
Platelets: 227 10*3/uL (ref 150.0–400.0)
RBC: 5.03 Mil/uL (ref 3.87–5.11)
RDW: 16 % — ABNORMAL HIGH (ref 11.5–14.6)
WBC: 9.9 10*3/uL (ref 4.5–10.5)

## 2011-08-17 LAB — BASIC METABOLIC PANEL
BUN: 16 mg/dL (ref 6–23)
CO2: 28 mEq/L (ref 19–32)
Calcium: 8.8 mg/dL (ref 8.4–10.5)
Chloride: 102 mEq/L (ref 96–112)
Creatinine, Ser: 0.8 mg/dL (ref 0.4–1.2)
GFR: 77.64 mL/min (ref >60.00)
Glucose, Bld: 224 mg/dL — ABNORMAL HIGH (ref 70–99)
Potassium: 4.4 mEq/L (ref 3.5–5.1)
Sodium: 139 mEq/L (ref 135–145)

## 2011-08-17 LAB — HEPATIC FUNCTION PANEL
ALT: 16 U/L (ref 0–35)
AST: 20 U/L (ref 0–37)
Albumin: 3.5 g/dL (ref 3.5–5.2)
Alkaline Phosphatase: 52 U/L (ref 39–117)
Bilirubin, Direct: 0.1 mg/dL (ref 0.0–0.3)
Total Bilirubin: 0.7 mg/dL (ref 0.3–1.2)
Total Protein: 6.8 g/dL (ref 6.0–8.3)

## 2011-08-17 LAB — VITAMIN B12: Vitamin B-12: 151 pg/mL — ABNORMAL LOW (ref 211–911)

## 2011-08-17 LAB — TSH: TSH: 2.41 u[IU]/mL (ref 0.35–5.50)

## 2011-08-17 NOTE — Progress Notes (Signed)
Labs only

## 2011-08-23 ENCOUNTER — Ambulatory Visit (INDEPENDENT_AMBULATORY_CARE_PROVIDER_SITE_OTHER): Payer: 59 | Admitting: *Deleted

## 2011-08-23 DIAGNOSIS — E538 Deficiency of other specified B group vitamins: Secondary | ICD-10-CM

## 2011-08-23 MED ORDER — CYANOCOBALAMIN 1000 MCG/ML IJ SOLN
1000.0000 ug | Freq: Once | INTRAMUSCULAR | Status: AC
  Administered 2011-08-23: 1000 ug via INTRAMUSCULAR

## 2011-08-25 ENCOUNTER — Other Ambulatory Visit: Payer: Self-pay | Admitting: Family Medicine

## 2011-08-25 DIAGNOSIS — E119 Type 2 diabetes mellitus without complications: Secondary | ICD-10-CM

## 2011-08-25 MED ORDER — METFORMIN HCL ER 750 MG PO TB24: ORAL_TABLET | ORAL | Status: DC

## 2011-08-25 NOTE — Telephone Encounter (Signed)
Patient started  taking metformin several months ago she wants to know if she is going to continue taking this - if so she would like 90 day supply -[facility]

## 2011-08-30 ENCOUNTER — Ambulatory Visit (INDEPENDENT_AMBULATORY_CARE_PROVIDER_SITE_OTHER): Payer: 59 | Admitting: *Deleted

## 2011-08-30 DIAGNOSIS — E538 Deficiency of other specified B group vitamins: Secondary | ICD-10-CM

## 2011-08-30 MED ORDER — CYANOCOBALAMIN 1000 MCG/ML IJ SOLN
1000.0000 ug | Freq: Once | INTRAMUSCULAR | Status: AC
  Administered 2011-08-30: 1000 ug via INTRAMUSCULAR

## 2011-09-02 ENCOUNTER — Other Ambulatory Visit: Payer: Self-pay | Admitting: Family Medicine

## 2011-09-07 ENCOUNTER — Ambulatory Visit (INDEPENDENT_AMBULATORY_CARE_PROVIDER_SITE_OTHER): Payer: 59 | Admitting: *Deleted

## 2011-09-07 DIAGNOSIS — E538 Deficiency of other specified B group vitamins: Secondary | ICD-10-CM

## 2011-09-07 MED ORDER — CYANOCOBALAMIN 1000 MCG/ML IJ SOLN
1000.0000 ug | Freq: Once | INTRAMUSCULAR | Status: AC
  Administered 2011-09-07: 1000 ug via INTRAMUSCULAR

## 2011-09-07 MED ORDER — CYANOCOBALAMIN 1000 MCG/ML IJ SOLN: 1000.0000 ug | Freq: Once | INTRAMUSCULAR | Status: DC

## 2011-09-12 ENCOUNTER — Other Ambulatory Visit: Payer: Self-pay | Admitting: Family Medicine

## 2011-09-14 ENCOUNTER — Ambulatory Visit (INDEPENDENT_AMBULATORY_CARE_PROVIDER_SITE_OTHER): Payer: 59 | Admitting: *Deleted

## 2011-09-14 DIAGNOSIS — E538 Deficiency of other specified B group vitamins: Secondary | ICD-10-CM

## 2011-09-14 MED ORDER — CYANOCOBALAMIN 1000 MCG/ML IJ SOLN
1000.0000 ug | Freq: Once | INTRAMUSCULAR | Status: AC
  Administered 2011-09-14: 1000 ug via INTRAMUSCULAR

## 2011-10-15 ENCOUNTER — Ambulatory Visit (INDEPENDENT_AMBULATORY_CARE_PROVIDER_SITE_OTHER): Payer: 59

## 2011-10-15 DIAGNOSIS — E538 Deficiency of other specified B group vitamins: Secondary | ICD-10-CM

## 2011-10-15 LAB — VITAMIN B12: Vitamin B-12: 273 pg/mL (ref 211–911)

## 2011-10-15 MED ORDER — CYANOCOBALAMIN 1000 MCG/ML IJ SOLN
1000.0000 ug | Freq: Once | INTRAMUSCULAR | Status: AC
  Administered 2011-10-15: 1000 ug via INTRAMUSCULAR

## 2011-11-08 ENCOUNTER — Other Ambulatory Visit: Payer: Self-pay | Admitting: Family Medicine

## 2011-11-17 ENCOUNTER — Ambulatory Visit (INDEPENDENT_AMBULATORY_CARE_PROVIDER_SITE_OTHER): Payer: 59 | Admitting: *Deleted

## 2011-11-17 DIAGNOSIS — E538 Deficiency of other specified B group vitamins: Secondary | ICD-10-CM

## 2011-11-17 MED ORDER — CYANOCOBALAMIN 1000 MCG/ML IJ SOLN
1000.0000 ug | Freq: Once | INTRAMUSCULAR | Status: AC
  Administered 2011-11-17: 1000 ug via INTRAMUSCULAR

## 2011-11-30 ENCOUNTER — Other Ambulatory Visit: Payer: Self-pay | Admitting: Family Medicine

## 2011-12-20 ENCOUNTER — Ambulatory Visit (INDEPENDENT_AMBULATORY_CARE_PROVIDER_SITE_OTHER): Payer: 59 | Admitting: *Deleted

## 2011-12-20 DIAGNOSIS — E538 Deficiency of other specified B group vitamins: Secondary | ICD-10-CM

## 2011-12-20 MED ORDER — CYANOCOBALAMIN 1000 MCG/ML IJ SOLN
1000.0000 ug | Freq: Once | INTRAMUSCULAR | Status: AC
  Administered 2011-12-20: 1000 ug via INTRAMUSCULAR

## 2011-12-26 ENCOUNTER — Other Ambulatory Visit: Payer: Self-pay | Admitting: Family Medicine

## 2011-12-28 ENCOUNTER — Other Ambulatory Visit: Payer: Self-pay | Admitting: Family Medicine

## 2012-01-06 ENCOUNTER — Other Ambulatory Visit: Payer: Self-pay | Admitting: Family Medicine

## 2012-01-17 ENCOUNTER — Ambulatory Visit (INDEPENDENT_AMBULATORY_CARE_PROVIDER_SITE_OTHER): Payer: 59 | Admitting: Internal Medicine

## 2012-01-17 VITALS — BP 126/64 | HR 95 | Temp 98.5°F | Wt 313.0 lb

## 2012-01-17 DIAGNOSIS — J40 Bronchitis, not specified as acute or chronic: Secondary | ICD-10-CM

## 2012-01-17 MED ORDER — AZELASTINE HCL 0.1 % NA SOLN: 2.0000 | Freq: Every day | NASAL | Status: DC

## 2012-01-17 MED ORDER — AZITHROMYCIN 250 MG PO TABS: ORAL_TABLET | ORAL | Status: AC

## 2012-01-17 NOTE — Patient Instructions (Signed)
Rest, fluids , tylenol For cough, take tussin DM twice a day as needed  For congestion use astelin, a nose spray , until better Take the antibiotic as prescribed ----> zithromax Call if no better in few days Call anytime if the symptoms are severe You are due to see Dr. Laury Axon  for a regular checkup. Please schedule at your earliest convenience

## 2012-01-17 NOTE — Progress Notes (Signed)
  Subjective:    Patient ID: Andrea Marks, female    DOB: [birth year], 61 y.o.   MRN: [id number]  HPI Acute visit Symptoms started 5 days ago with cough, sinus congestion. She's taking over-the-counter tussin DM and a decongestant, she's not getting better or worse.   Past Medical History  Diagnosis Date  . Depression   . Diabetes mellitus   . GERD (gastroesophageal reflux disease)   . Hyperlipidemia   . Hypertension       Review of Systems Subjective fever, some chills. No nausea or vomiting. Some diarrhea initially, that is gone. No chest pain, admits to chest congestion and mild wheezing. Mild shortness of breath today. She used to be a smoker but quit years ago. No history of emphysema or asthma that she knows. Has never used an inhaler.    Objective:   Physical Exam  General -- alert, well-developed, and well-nourished. NAD  Neck --no LADs HEENT -- TMs normal, throat w/o redness, face symmetric and mildly  tender to palpation B, nose slt congested  Lungs -- normal respiratory effort, no intercostal retractions, no accessory muscle use, B large airway congestion tha clears w/ cough, no actual wheezing Heart-- normal rate, regular rhythm, no murmur, and no gallop.         Assessment & Plan:  Symptoms consistent with bronchitis, see instructions

## 2012-01-18 ENCOUNTER — Ambulatory Visit: Payer: 59

## 2012-01-18 ENCOUNTER — Encounter: Payer: Self-pay | Admitting: Internal Medicine

## 2012-01-18 ENCOUNTER — Other Ambulatory Visit: Payer: 59

## 2012-01-27 ENCOUNTER — Ambulatory Visit (INDEPENDENT_AMBULATORY_CARE_PROVIDER_SITE_OTHER): Payer: 59 | Admitting: Family Medicine

## 2012-01-27 ENCOUNTER — Ambulatory Visit (HOSPITAL_COMMUNITY)
Admission: RE | Admit: 2012-01-27 | Discharge: 2012-01-27 | Disposition: A | Discharge status: Home / Self Care | Payer: 59 | Source: Ambulatory Visit | Attending: Family Medicine | Admitting: Family Medicine

## 2012-01-27 ENCOUNTER — Telehealth: Payer: Self-pay | Admitting: *Deleted

## 2012-01-27 ENCOUNTER — Telehealth: Payer: Self-pay | Admitting: Family Medicine

## 2012-01-27 ENCOUNTER — Encounter: Payer: Self-pay | Admitting: Family Medicine

## 2012-01-27 VITALS — BP 138/76 | HR 89 | Temp 98.5°F | Wt 314.0 lb

## 2012-01-27 DIAGNOSIS — J40 Bronchitis, not specified as acute or chronic: Secondary | ICD-10-CM

## 2012-01-27 DIAGNOSIS — R0602 Shortness of breath: Secondary | ICD-10-CM

## 2012-01-27 DIAGNOSIS — R05 Cough: Secondary | ICD-10-CM | POA: Insufficient documentation

## 2012-01-27 DIAGNOSIS — R059 Cough, unspecified: Secondary | ICD-10-CM | POA: Insufficient documentation

## 2012-01-27 IMAGING — CR DG CHEST 2V
2 series · 2 of 2 positions shown · non-contrast
Comparison: [DATE]

CLINICAL DATA: Bronchitis, shortness of breath and cough

CHEST - 2 VIEW

[w chest pa]
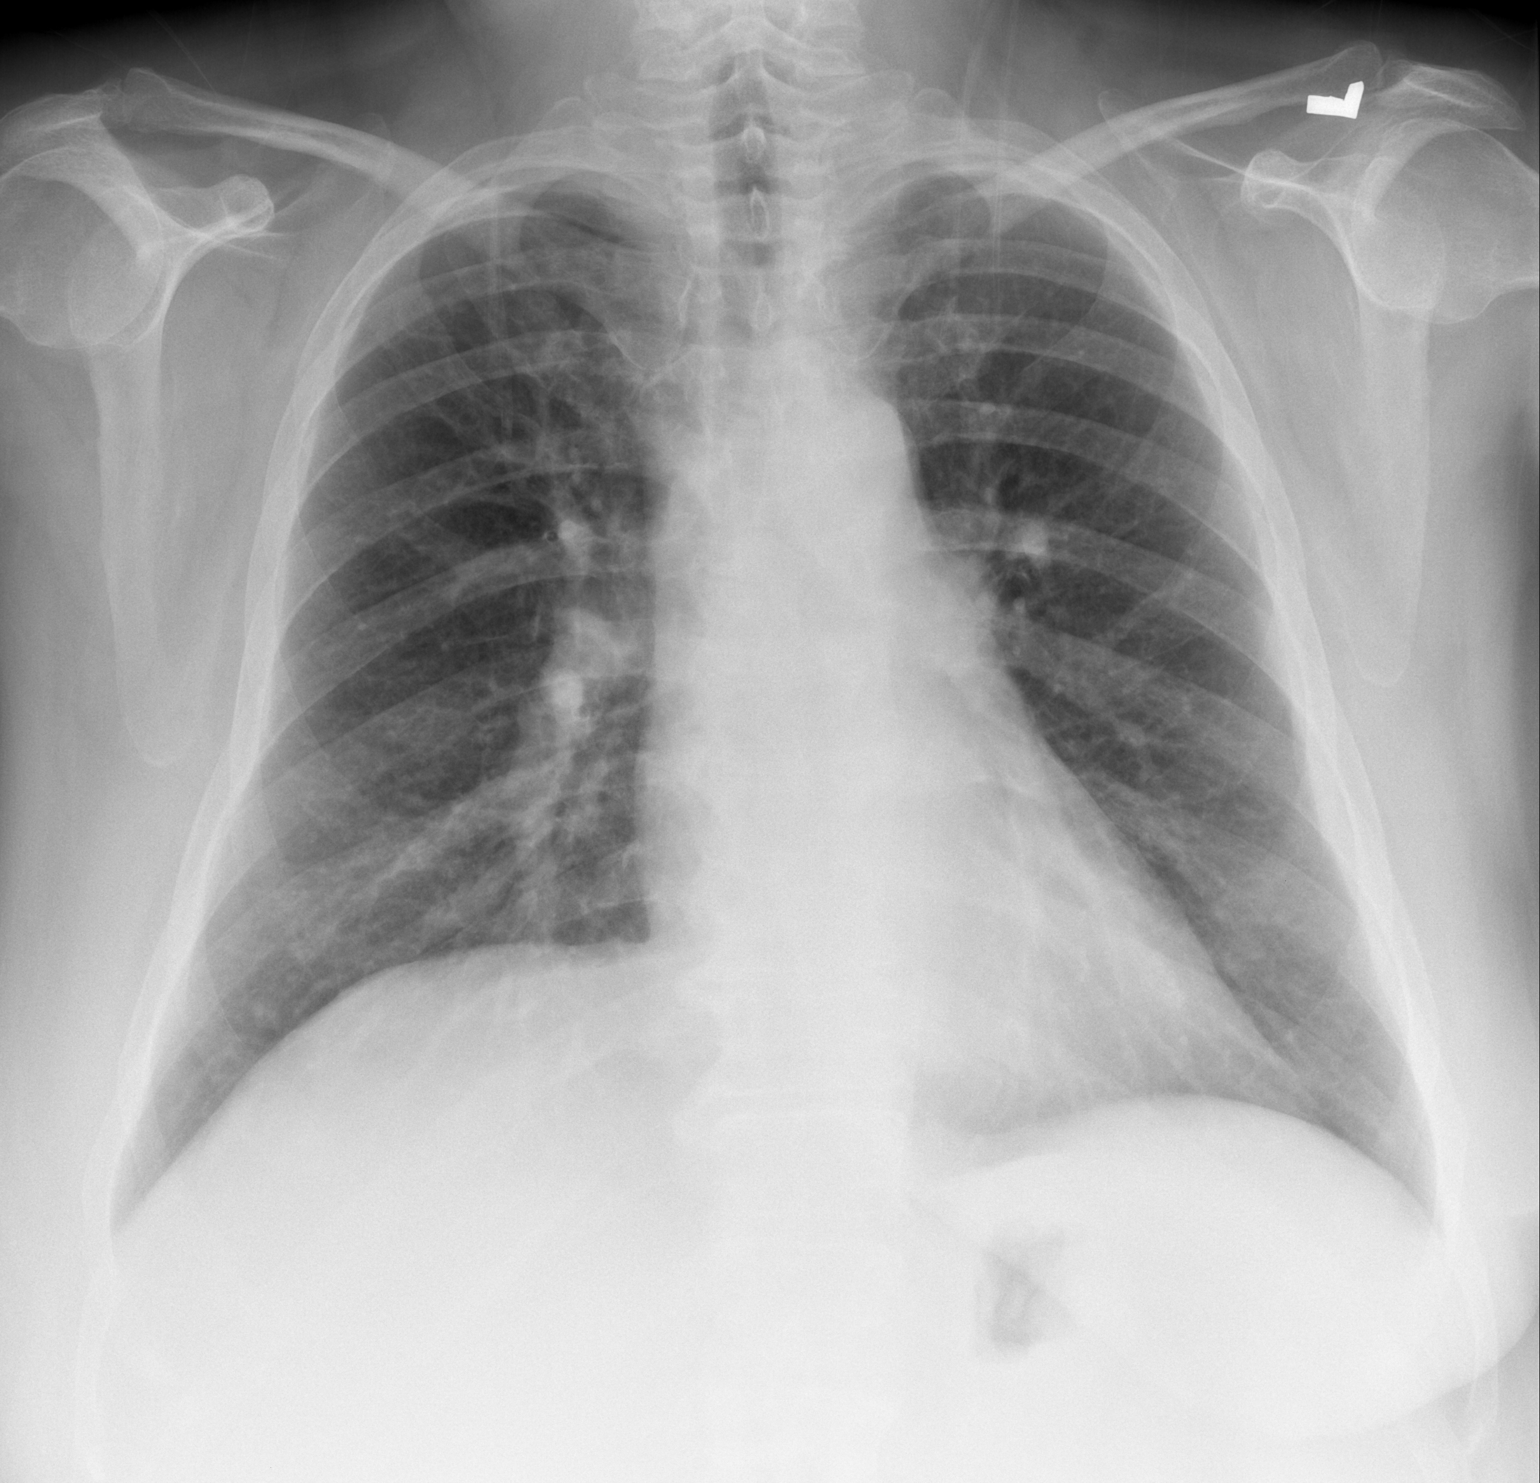

[w chest lat]
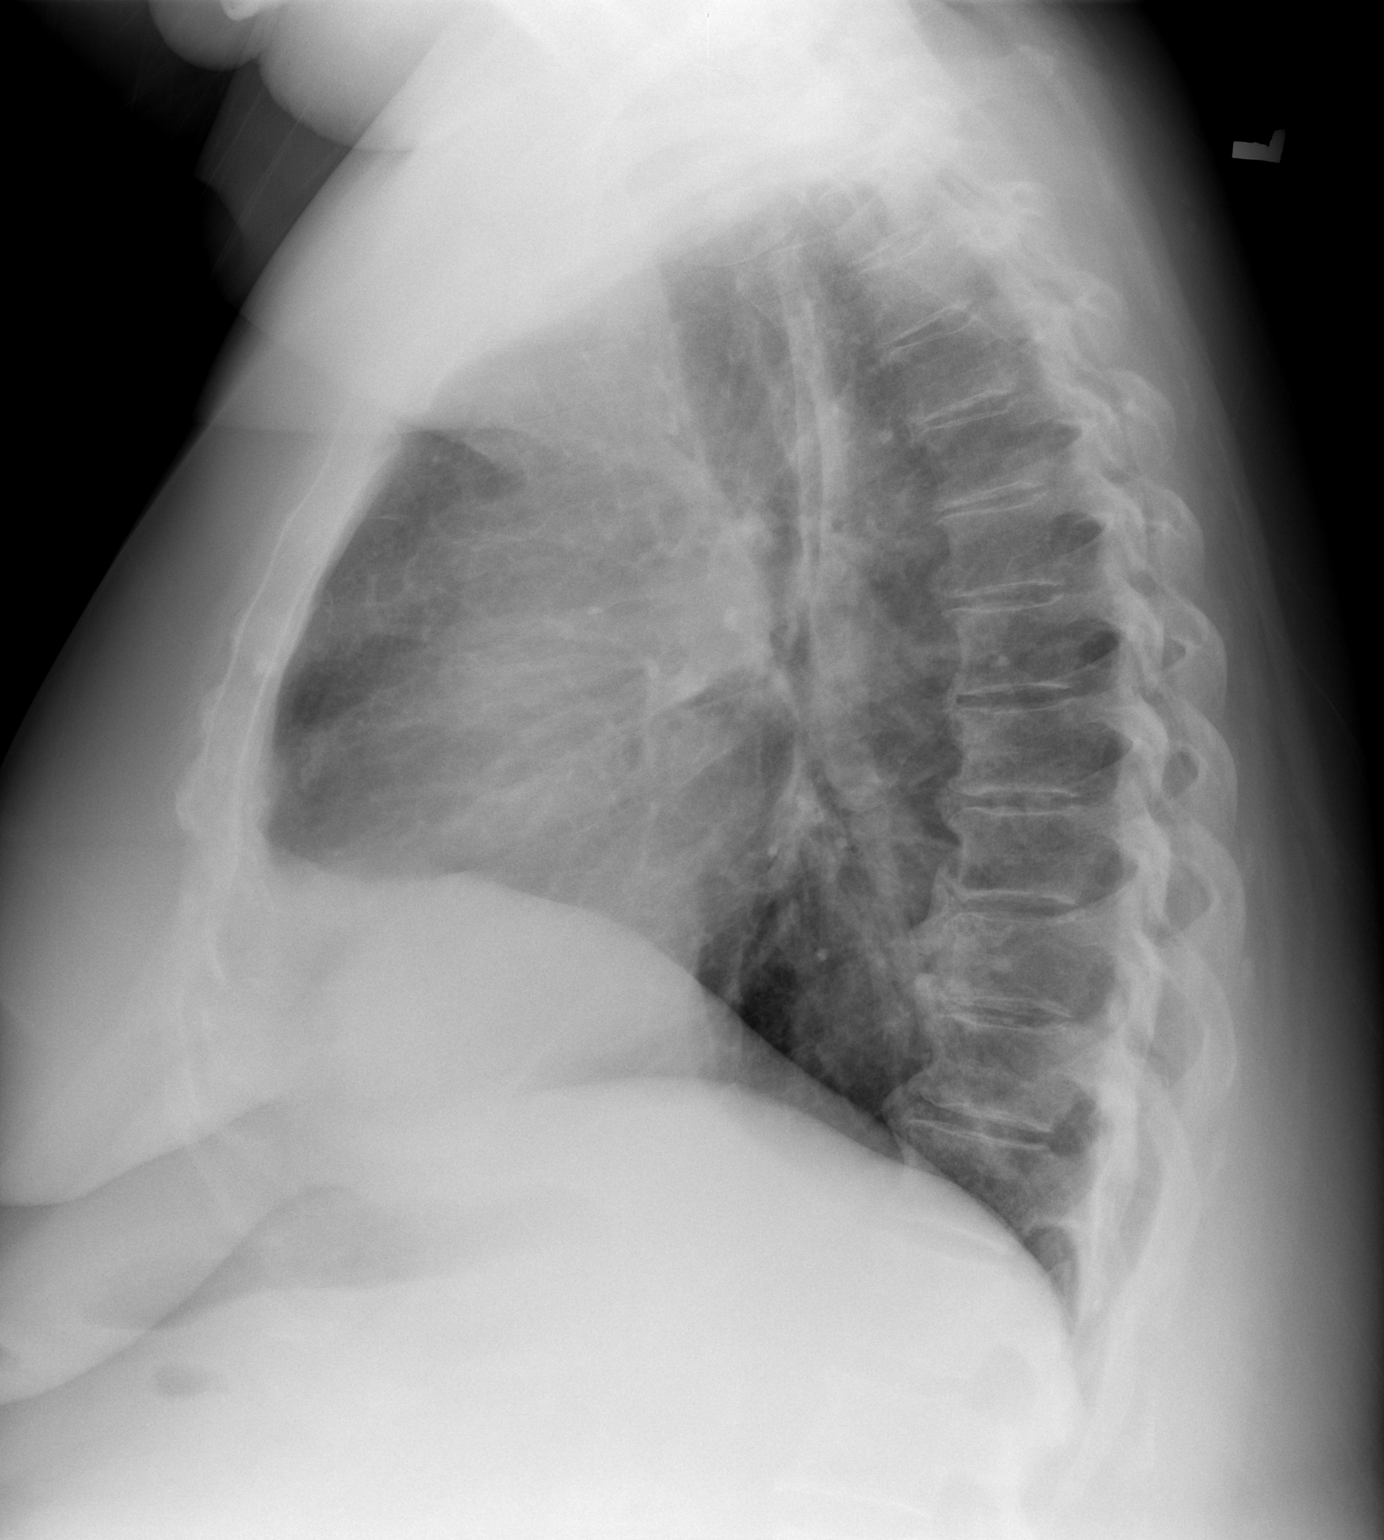

[2 of 2 positions shown; findings below may reference images not displayed]

FINDINGS: The cardiac silhouette, mediastinum, pulmonary
vasculature are within normal limits.  Both lungs are clear.
There is no acute bony abnormality.
IMPRESSION: There is no evidence of acute cardiac or pulmonary process.

## 2012-01-27 MED ORDER — METHYLPREDNISOLONE ACETATE 80 MG/ML IJ SUSP
80.0000 mg | Freq: Once | INTRAMUSCULAR | Status: AC
  Administered 2012-01-27: 80 mg via INTRAMUSCULAR

## 2012-01-27 MED ORDER — PREDNISONE 10 MG PO TABS: ORAL_TABLET | ORAL | Status: DC

## 2012-01-27 MED ORDER — MOXIFLOXACIN HCL 400 MG PO TABS: 400.0000 mg | ORAL_TABLET | Freq: Every day | ORAL | Status: DC

## 2012-01-27 NOTE — Progress Notes (Signed)
  Subjective:     Andrea Marks is a 61 y.o. female here for evaluation of a cough. Onset of symptoms was 4 days ago. Symptoms have been gradually worsening since that time. The cough is productive and is aggravated by exercise and reclining position. Associated symptoms include: chills, fever, shortness of breath, sputum production and wheezing. Patient does not have a history of asthma. Patient does not have a history of environmental allergens. Patient has not traveled recently. Patient does not have a history of smoking. Patient has had a previous chest x-ray. Patient has not had a PPD done.  The following portions of the patient's history were reviewed and updated as appropriate: allergies, current medications, past family history, past medical history, past social history, past surgical history and problem list.  Review of Systems Pertinent items are noted in HPI.    Objective:    Oxygen saturation 94% on room air BP 138/76  Pulse 89  Temp(Src) 98.5 F (36.9 C) (Oral)  Wt 314 lb (142.429 kg)  SpO2 94% General appearance: alert, cooperative, appears stated age and no distress Ears: normal TM's and external ear canals both ears Nose: Nares normal. Septum midline. Mucosa normal. No drainage or sinus tenderness. Throat: lips, mucosa, and tongue normal; teeth and gums normal Neck: no adenopathy, supple, symmetrical, trachea midline and thyroid not enlarged, symmetric, no tenderness/mass/nodules Lungs: wheezes bilaterally Heart: S1, S2 normal    Assessment:    Acute Bronchitis    Plan:    Antibiotics per medication orders. Antitussives per medication orders. Call if shortness of breath worsens, blood in sputum, change in character of cough, development of fever or chills, inability to maintain nutrition and hydration. Avoid exposure to tobacco smoke and fumes. pred taper

## 2012-01-27 NOTE — Patient Instructions (Signed)
Sliding scale         200-250---  2 units        251- 300 ----4 units        301-350        6 units        351-400         8 u         > 400         10 u and call Dr

## 2012-01-27 NOTE — Telephone Encounter (Signed)
received call from CAN Alvino Chapel advising that pt is experiencing mild wheezing, SOB upon exertion, congestion, pt noted dx with bronchitis on 01-17-12 with MD Drue Novel, per MD [facility] verbal order given to send pt to [facility] for chest x-ray and set pt up for the 4:15pm apt today, pt CAN rep not sure if MD Lowne would want to see pt earlier and the 4:15 apt was only on noted available, CAN rep advised pt to go to [facility] for chest x-ray, pt is to accept apt for today at 4:15pm with CAN rep Alvino Chapel.chest x-ray set up per MD [facility] CMA Arman Bogus

## 2012-01-27 NOTE — Progress Notes (Signed)
Addended by: Arnette Norris on: 01/27/2012 05:50 PM   Modules accepted: Orders

## 2012-01-27 NOTE — Telephone Encounter (Signed)
CAN rep Andrea Marks called back per pt is requesting to have chest x ray done at [facility] per closer to her home, set up future order for the chest x-ray to be done at [facility] instead of [facility]

## 2012-01-27 NOTE — Telephone Encounter (Signed)
Caller: [place]/Patient; PCP: Lelon Perla.; CB#: [phone]; Call regarding Cough/Congestion;  Called re chest heaviness, > cough and congestion. Short of breath/fatigue with activity. Onset 01/25/12.   Afebrile/tactile.  Seen 01/17/12; diagnosed with bronchitis. Treated with Zpack.  Mild wheezing.   FBS not tested in days. FBS 195 at 1000.  Advised to see MD now for worsening breathing problems per Diabetes Respiratory Problems Guideline.  Only one appt remains for 01/27/12 at 1615;  Called office nurse/Kimberly r/t see now disposition.  Dr Laury Axon ordered CXR now at [facility] on [address]  and appt at 1615 01/27/12. Pt prefers appt at [facility] d/t proximity.  Cala Bradford notified; MD will order CXR to be done at [facility] outpt.

## 2012-01-28 ENCOUNTER — Encounter (HOSPITAL_COMMUNITY): Payer: Self-pay | Admitting: *Deleted

## 2012-01-28 ENCOUNTER — Emergency Department (HOSPITAL_COMMUNITY)
Admission: EM | Admit: 2012-01-28 | Discharge: 2012-01-28 | Disposition: A | Discharge status: Home / Self Care | Payer: 59 | Attending: Emergency Medicine | Admitting: Emergency Medicine

## 2012-01-28 DIAGNOSIS — R739 Hyperglycemia, unspecified: Secondary | ICD-10-CM

## 2012-01-28 DIAGNOSIS — Z87891 Personal history of nicotine dependence: Secondary | ICD-10-CM | POA: Insufficient documentation

## 2012-01-28 DIAGNOSIS — K219 Gastro-esophageal reflux disease without esophagitis: Secondary | ICD-10-CM | POA: Insufficient documentation

## 2012-01-28 DIAGNOSIS — E785 Hyperlipidemia, unspecified: Secondary | ICD-10-CM | POA: Insufficient documentation

## 2012-01-28 DIAGNOSIS — E119 Type 2 diabetes mellitus without complications: Secondary | ICD-10-CM | POA: Insufficient documentation

## 2012-01-28 DIAGNOSIS — I10 Essential (primary) hypertension: Secondary | ICD-10-CM | POA: Insufficient documentation

## 2012-01-28 DIAGNOSIS — Z794 Long term (current) use of insulin: Secondary | ICD-10-CM | POA: Insufficient documentation

## 2012-01-28 HISTORY — DX: Obesity, unspecified: E66.9

## 2012-01-28 LAB — BASIC METABOLIC PANEL
BUN: 18 mg/dL (ref 6–23)
CO2: 24 mEq/L (ref 19–32)
Calcium: 9.6 mg/dL (ref 8.4–10.5)
Chloride: 99 mEq/L (ref 96–112)
Creatinine, Ser: 0.54 mg/dL (ref 0.50–1.10)
GFR calc Af Amer: >90 mL/min (ref >90)
GFR calc non Af Amer: >90 mL/min (ref >90)
Glucose, Bld: 285 mg/dL — ABNORMAL HIGH (ref 70–99)
Potassium: 4.1 mEq/L (ref 3.5–5.1)
Sodium: 136 mEq/L (ref 135–145)

## 2012-01-28 LAB — GLUCOSE, CAPILLARY: Glucose-Capillary: 244 mg/dL — ABNORMAL HIGH (ref 70–99)

## 2012-01-28 NOTE — ED Notes (Signed)
Pt presents to department for evaluation of hyperglycemia. States she noticed her CBG trending upward today at home. States she recently began taking PO steroids for bronchitis. States she feels very fatigued. Denies any other complaints at the time. Pt is conscious alert and oriented x4. Skin warm and dry.

## 2012-01-28 NOTE — Discharge Instructions (Signed)
Hyperglycemia  Hyperglycemia occurs when the glucose (sugar) in your blood is too high. Hyperglycemia can happen for many reasons, but it most often happens to people who do not know they have diabetes or are not managing their diabetes properly.   CAUSES   Whether you have diabetes or not, there are other causes of hyperglycemia. Hyperglycemia can occur when you have diabetes, but it can also occur in other situations that you might not be as aware of, such as:  Diabetes  · If you have diabetes and are having problems controlling your blood glucose, hyperglycemia could occur because of some of the following reasons:  · Not following your meal plan.  · Not taking your diabetes medications or not taking it properly.  · Exercising less or doing less activity than you normally do.  · Being sick.  Pre-diabetes  · This cannot be ignored. Before people develop Type 2 diabetes, they almost always have "pre-diabetes." This is when your blood glucose levels are higher than normal, but not yet high enough to be diagnosed as diabetes. Research has shown that some long-term damage to the body, especially the heart and circulatory system, may already be occurring during pre-diabetes. If you take action to manage your blood glucose when you have pre-diabetes, you may delay or prevent Type 2 diabetes from developing.  Stress  · If you have diabetes, you may be "diet" controlled or on oral medications or insulin to control your diabetes. However, you may find that your blood glucose is higher than usual in the hospital whether you have diabetes or not. This is often referred to as "stress hyperglycemia." Stress can elevate your blood glucose. This happens because of hormones put out by the body during times of stress. If stress has been the cause of your high blood glucose, it can be followed regularly by your caregiver. That way he/she can make sure your hyperglycemia does not continue to get worse or progress to  diabetes.  Steroids  · Steroids are medications that act on the infection fighting system (immune system) to block inflammation or infection. One side effect can be a rise in blood glucose. Most people can produce enough extra insulin to allow for this rise, but for those who cannot, steroids make blood glucose levels go even higher. It is not unusual for steroid treatments to "uncover" diabetes that is developing. It is not always possible to determine if the hyperglycemia will go away after the steroids are stopped. A special blood test called an A1c is sometimes done to determine if your blood glucose was elevated before the steroids were started.  SYMPTOMS  · Thirsty.  · Frequent urination.  · Dry mouth.  · Blurred vision.  · Tired or fatigue.  · Weakness.  · Sleepy.  · Tingling in feet or leg.  DIAGNOSIS   Diagnosis is made by monitoring blood glucose in one or all of the following ways:  · A1c test. This is a chemical found in your blood.  · Fingerstick blood glucose monitoring.  · Laboratory results.  TREATMENT   First, knowing the cause of the hyperglycemia is important before the hyperglycemia can be treated. Treatment may include, but is not be limited to:  · Education.  · Change or adjustment in medications.  · Change or adjustment in meal plan.  · Treatment for an illness, infection, etc.  · More frequent blood glucose monitoring.  · Change in exercise plan.  · Decreasing or stopping steroids.  ·   Lifestyle changes.  HOME CARE INSTRUCTIONS   · Test your blood glucose as directed.  · Exercise regularly. Your caregiver will give you instructions about exercise. Pre-diabetes or diabetes which comes on with stress is helped by exercising.  · Eat wholesome, balanced meals. Eat often and at regular, fixed times. Your caregiver or nutritionist will give you a meal plan to guide your sugar intake.  · Being at an ideal weight is important. If needed, losing as little as 10 to 15 pounds may help improve blood  glucose levels.  SEEK MEDICAL CARE IF:   · You have questions about medicine, activity, or diet.  · You continue to have symptoms (problems such as increased thirst, urination, or weight gain).  SEEK IMMEDIATE MEDICAL CARE IF:   · You are vomiting or have diarrhea.  · Your breath smells fruity.  · You are breathing faster or slower.  · You are very sleepy or incoherent.  · You have numbness, tingling, or pain in your feet or hands.  · You have chest pain.  · Your symptoms get worse even though you have been following your caregiver's orders.  · If you have any other questions or concerns.  Document Released: 04/13/2001 Document Revised: 10/07/2011 Document Reviewed: 06/09/2009  [employer]® Patient Information ©2012 [employer].

## 2012-01-28 NOTE — ED Provider Notes (Signed)
History     CSN: [id number]  Arrival date & time 01/28/12  1634   First MD Initiated Contact with Patient 01/28/12 2020      Chief Complaint  Patient presents with  . Hyperglycemia    (Consider location/radiation/quality/duration/timing/severity/associated sxs/prior treatment) The history is provided by the patient.   patient has a cough and URI symptoms for last couple weeks. She was started by her primary care Dr. on Avelox steroids and insulin. Her sugars have been high today. Did not corrected down with her insulin. She's had some mild urinary frequency. No lightheadedness or dizziness. No fevers. She's had a nonproductive cough. No chest pain. She's not been on insulin for now.  Past Medical History  Diagnosis Date  . Depression   . Diabetes mellitus   . GERD (gastroesophageal reflux disease)   . Hyperlipidemia   . Hypertension   . Obesity     Past Surgical History  Procedure Date  . Abdominal hysterectomy 1991    BSO    Family History  Problem Relation Age of Onset  . Macular degeneration Mother   . Heart disease Mother     syncope  . Lung cancer      asbestos  . Lung disease Father 29    mesothelioma  . Cancer Maternal Aunt     stomach  . Heart disease Paternal Uncle     cabg  . Diabetes Paternal Uncle   . Diabetes Paternal Grandmother   . Heart disease Paternal Uncle   . Diabetes Paternal Uncle   . Diabetes Paternal Uncle   . Heart disease Paternal Uncle   . Heart disease Paternal Uncle   . Heart disease Paternal Uncle   . Cancer Other     lung    History  Substance Use Topics  . Smoking status: Former Games developer  . Smokeless tobacco: Never Used  . Alcohol Use: No    OB History    Grav Para Term Preterm Abortions TAB SAB Ect Mult Living                  Review of Systems  Constitutional: Negative for activity change and appetite change.  HENT: Negative for neck stiffness.   Eyes: Negative for pain.  Respiratory: Positive for cough.  Negative for chest tightness and shortness of breath.   Cardiovascular: Negative for chest pain and leg swelling.  Gastrointestinal: Negative for nausea, vomiting, abdominal pain and diarrhea.  Genitourinary: Positive for urgency. Negative for flank pain.  Musculoskeletal: Negative for back pain.  Skin: Negative for rash.  Neurological: Negative for weakness, numbness and headaches.  Psychiatric/Behavioral: Negative for behavioral problems.    Allergies  Hydrocodone-acetaminophen; Oxycodone hcl; and Penicillins  Home Medications   Current Outpatient Rx  Name Route Sig Dispense Refill  . ACETAMINOPHEN ER 650 MG PO TBCR Oral Take 650 mg by mouth 2 (two) times daily as needed. For pain    . ASPIRIN 81 MG PO TABS Oral Take 81 mg by mouth daily.      . AZELASTINE HCL 137 MCG/SPRAY NA SOLN Nasal Place 2 sprays into the nose at bedtime. Use in each nostril as directed    . BENAZEPRIL HCL 20 MG PO TABS Oral Take 20 mg by mouth daily.    Marland Kitchen DOCUSATE SODIUM 100 MG PO CAPS Oral Take 100 mg by mouth every other day.      . ESTERIFIED ESTROGENS 2.5 MG PO TABS Oral Take 1 tablet by mouth every other day.    Marland Kitchen  EZETIMIBE-SIMVASTATIN 10-40 MG PO TABS  1 po qhs    . FLUOXETINE HCL 20 MG PO TABS Oral Take 60 mg by mouth daily.    Marland Kitchen FREESTYLE UNISTICK II LANCETS MISC  2 (two) times daily.      Marland Kitchen GLIMEPIRIDE 2 MG PO TABS Oral Take 2 mg by mouth daily before breakfast.    . GLUCOSE BLOOD VI STRP  Use as instructed 100 each 0  . GLUCOSE BLOOD VI STRP  Use as instructed 100 each 2  . INSULIN ASPART 100 UNIT/ML [place] SOLN Subcutaneous Inject 2-10 Units into the skin 3 (three) times daily before meals. Per sliding scale    . VICTOZA [place] Subcutaneous Inject 1.8 mg into the skin daily.    Marland Kitchen METFORMIN HCL ER 750 MG PO TB24 Oral Take 750 mg by mouth daily with breakfast.    . MOXIFLOXACIN HCL 400 MG PO TABS Oral Take 400 mg by mouth daily.    Marland Kitchen NOVOFINE 32G X 6 MM MISC  USE 1 NEEDLE UNDER THE SKIN DAILY 100 each 2  .  OXAPROZIN 600 MG PO TABS Oral Take 600 mg by mouth daily.     Marland Kitchen PREDNISONE 10 MG PO TABS  10-30 mg See admin instructions. 3 po qd for 3 days then 2 po qd for 3 days the 1 po qd for 3 days    . RANITIDINE HCL 300 MG PO TABS Oral Take 300 mg by mouth at bedtime.    Marland Kitchen LIRAGLUTIDE 18 MG/3ML [place] SOLN      . METFORMIN HCL 850 MG PO TABS Oral Take 1 tablet (850 mg total) by mouth 3 (three) times daily with meals. 270 tablet 0    BP 129/77  Pulse 106  Temp(Src) 99 F (37.2 C) (Oral)  Resp 18  SpO2 97%  Physical Exam  Nursing note and vitals reviewed. Constitutional: She is oriented to person, place, and time. She appears well-developed and well-nourished.  HENT:  Head: Normocephalic and atraumatic.  Eyes: EOM are normal. Pupils are equal, round, and reactive to light.  Neck: Normal range of motion. Neck supple.  Cardiovascular: Regular rhythm and normal heart sounds.   No murmur heard. Pulmonary/Chest: Effort normal. No respiratory distress. She has no wheezes. She has no rales.       Mildly harsh breath sounds  Abdominal: Soft. Bowel sounds are normal. She exhibits no distension. There is no tenderness. There is no rebound and no guarding.  Musculoskeletal: Normal range of motion.  Neurological: She is alert and oriented to person, place, and time. No cranial nerve deficit.  Skin: Skin is warm and dry.  Psychiatric: She has a normal mood and affect. Her speech is normal.    ED Course  Procedures (including critical care time)  Labs Reviewed  BASIC METABOLIC PANEL - Abnormal; Notable for the following:    Glucose, Bld 285 (*)    All other components within normal limits  GLUCOSE, CAPILLARY - Abnormal; Notable for the following:    Glucose-Capillary 244 (*)    All other components within normal limits   Dg Chest 2 View  01/27/2012  *RADIOLOGY REPORT*  Clinical Data: Bronchitis, shortness of breath and cough  CHEST - 2 VIEW  Comparison: July 31, 2007  Findings: The cardiac  silhouette, mediastinum, pulmonary vasculature are within normal limits.  Both lungs are clear. There is no acute bony abnormality.  IMPRESSION: There is no evidence of acute cardiac or pulmonary process.  Original Report Authenticated By:  Brandon Melnick, M.D.     1. Hyperglycemia       MDM  Patient with bronchitis on antibiotics and prednisone. She has had a glucose elevation. This improved here down to 45. She does not appear to be in DKA. She's on insulin home and her sliding scale is been adjusted slightly. She'll follow up with her primary care as needed.        Juliet Rude. Rubin Payor, MD 01/28/12 2129

## 2012-01-28 NOTE — ED Notes (Signed)
CBG 244 

## 2012-01-28 NOTE — ED Notes (Signed)
CBG 293 on arrival

## 2012-01-28 NOTE — ED Notes (Signed)
Pt was diagnosed with bronchitis and was given a shot of prednisone last night and began taking po prednisone this am for bronchitis.  Pt also began SSI insulin this am and her CBG was 407 at home.  Pt took 10insulin and was told to come in.  CBG 293 on arrival to ER

## 2012-01-31 ENCOUNTER — Other Ambulatory Visit: Payer: Self-pay | Admitting: Family Medicine

## 2012-01-31 DIAGNOSIS — Z1231 Encounter for screening mammogram for malignant neoplasm of breast: Secondary | ICD-10-CM

## 2012-01-31 LAB — GLUCOSE, CAPILLARY: Glucose-Capillary: 293 mg/dL — ABNORMAL HIGH (ref 70–99)

## 2012-02-01 ENCOUNTER — Ambulatory Visit (INDEPENDENT_AMBULATORY_CARE_PROVIDER_SITE_OTHER): Payer: 59

## 2012-02-01 ENCOUNTER — Other Ambulatory Visit (INDEPENDENT_AMBULATORY_CARE_PROVIDER_SITE_OTHER): Payer: 59

## 2012-02-01 DIAGNOSIS — D518 Other vitamin B12 deficiency anemias: Secondary | ICD-10-CM

## 2012-02-01 DIAGNOSIS — E785 Hyperlipidemia, unspecified: Secondary | ICD-10-CM

## 2012-02-01 LAB — HEMOGLOBIN A1C: Hgb A1c MFr Bld: 8.9 % — ABNORMAL HIGH (ref 4.6–6.5)

## 2012-02-01 LAB — VITAMIN B12: Vitamin B-12: 433 pg/mL (ref 211–911)

## 2012-02-01 MED ORDER — CYANOCOBALAMIN 1000 MCG/ML IJ SOLN
1000.0000 ug | Freq: Once | INTRAMUSCULAR | Status: AC
  Administered 2012-02-01: 1000 ug via INTRAMUSCULAR

## 2012-02-10 ENCOUNTER — Ambulatory Visit (INDEPENDENT_AMBULATORY_CARE_PROVIDER_SITE_OTHER): Payer: 59 | Admitting: Family Medicine

## 2012-02-10 ENCOUNTER — Encounter: Payer: Self-pay | Admitting: Family Medicine

## 2012-02-10 VITALS — BP 124/72 | HR 71 | Temp 98.3°F | Wt 309.8 lb

## 2012-02-10 DIAGNOSIS — E785 Hyperlipidemia, unspecified: Secondary | ICD-10-CM

## 2012-02-10 DIAGNOSIS — Z23 Encounter for immunization: Secondary | ICD-10-CM

## 2012-02-10 DIAGNOSIS — I10 Essential (primary) hypertension: Secondary | ICD-10-CM

## 2012-02-10 DIAGNOSIS — E119 Type 2 diabetes mellitus without complications: Secondary | ICD-10-CM

## 2012-02-10 DIAGNOSIS — Z2911 Encounter for prophylactic immunotherapy for respiratory syncytial virus (RSV): Secondary | ICD-10-CM

## 2012-02-10 MED ORDER — METFORMIN HCL ER 750 MG PO TB24: ORAL_TABLET | ORAL | Status: DC

## 2012-02-10 MED ORDER — BENAZEPRIL HCL 20 MG PO TABS: 20.0000 mg | ORAL_TABLET | Freq: Every day | ORAL | Status: DC

## 2012-02-10 NOTE — Assessment & Plan Note (Signed)
Increase glucophage xr to 750 mg 2 po qd  con't other meds and recheck 3 months

## 2012-02-10 NOTE — Patient Instructions (Signed)

## 2012-02-10 NOTE — Progress Notes (Signed)
  Subjective:    Patient ID: Andrea Marks, female    DOB: 06-22-1951, 61 y.o.   MRN: [id number]  HPI Pt here to review labs.   She needs a new glucometer.  Pt ended up having to go to ER after prednisone shot secondary to inc glucose.  The sliding scale did not bring it down.  They doubled the sliding scale and sent her home.   Pt also has ? About shingles vaccine.   Review of Systems    as above Objective:   Physical Exam  Constitutional: She is oriented to person, place, and time. She appears well-developed and well-nourished.  Cardiovascular: Normal rate and normal heart sounds.   Pulmonary/Chest: Effort normal and breath sounds normal. No respiratory distress. She has no wheezes. She has no rales.  Neurological: She is alert and oriented to person, place, and time.  Psychiatric: She has a normal mood and affect. Her behavior is normal. Judgment and thought content normal.          Assessment & Plan:

## 2012-02-10 NOTE — Assessment & Plan Note (Signed)
Labs reviewed con't meds 

## 2012-02-10 NOTE — Assessment & Plan Note (Signed)
Stable con't meds 

## 2012-02-11 ENCOUNTER — Other Ambulatory Visit: Payer: Self-pay | Admitting: Internal Medicine

## 2012-02-28 ENCOUNTER — Ambulatory Visit: Payer: 59

## 2012-03-06 ENCOUNTER — Ambulatory Visit: Payer: 59

## 2012-03-10 ENCOUNTER — Other Ambulatory Visit: Payer: Self-pay | Admitting: Family Medicine

## 2012-03-21 ENCOUNTER — Ambulatory Visit: Payer: 59 | Admitting: Family Medicine

## 2012-03-25 ENCOUNTER — Other Ambulatory Visit: Payer: Self-pay | Admitting: Family Medicine

## 2012-05-11 ENCOUNTER — Ambulatory Visit: Payer: 59 | Admitting: Family Medicine

## 2012-05-17 ENCOUNTER — Other Ambulatory Visit: Payer: Self-pay | Admitting: Family Medicine

## 2012-06-05 ENCOUNTER — Telehealth: Payer: Self-pay | Admitting: Family Medicine

## 2012-06-05 MED ORDER — METFORMIN HCL ER 750 MG PO TB24: 750.0000 mg | ORAL_TABLET | Freq: Every day | ORAL | Status: DC

## 2012-06-05 NOTE — Telephone Encounter (Signed)
Pt states she has had "family issues" and didn't realize she was out of Metformin. She needs a refill called into [facility] on [address]

## 2012-06-09 ENCOUNTER — Encounter: Payer: Self-pay | Admitting: Family Medicine

## 2012-06-09 ENCOUNTER — Ambulatory Visit (INDEPENDENT_AMBULATORY_CARE_PROVIDER_SITE_OTHER): Payer: 59 | Admitting: Family Medicine

## 2012-06-09 VITALS — BP 124/70 | HR 89 | Temp 98.5°F | Wt 310.8 lb

## 2012-06-09 DIAGNOSIS — F329 Major depressive disorder, single episode, unspecified: Secondary | ICD-10-CM

## 2012-06-09 DIAGNOSIS — I10 Essential (primary) hypertension: Secondary | ICD-10-CM

## 2012-06-09 DIAGNOSIS — E119 Type 2 diabetes mellitus without complications: Secondary | ICD-10-CM

## 2012-06-09 DIAGNOSIS — E785 Hyperlipidemia, unspecified: Secondary | ICD-10-CM

## 2012-06-09 DIAGNOSIS — F3289 Other specified depressive episodes: Secondary | ICD-10-CM

## 2012-06-09 DIAGNOSIS — R232 Flushing: Secondary | ICD-10-CM

## 2012-06-09 LAB — CBC WITH DIFFERENTIAL/PLATELET
Basophils Absolute: 0.1 10*3/uL (ref 0.0–0.1)
Basophils Relative: 1 % (ref 0–1)
Eosinophils Absolute: 0.2 10*3/uL (ref 0.0–0.7)
Eosinophils Relative: 2 % (ref 0–5)
HCT: 42 % (ref 36.0–46.0)
Hemoglobin: 13.7 g/dL (ref 12.0–15.0)
Lymphocytes Relative: 18 % (ref 12–46)
Lymphs Abs: 1.5 10*3/uL (ref 0.7–4.0)
MCH: 27.8 pg (ref 26.0–34.0)
MCHC: 32.6 g/dL (ref 30.0–36.0)
MCV: 85.4 fL (ref 78.0–100.0)
Monocytes Absolute: 0.5 10*3/uL (ref 0.1–1.0)
Monocytes Relative: 6 % (ref 3–12)
Neutro Abs: 6.3 10*3/uL (ref 1.7–7.7)
Neutrophils Relative %: 73 % (ref 43–77)
Platelets: 228 10*3/uL (ref 150–400)
RBC: 4.92 MIL/uL (ref 3.87–5.11)
RDW: 14.2 % (ref 11.5–15.5)
WBC: 8.5 10*3/uL (ref 4.0–10.5)

## 2012-06-09 LAB — POCT URINALYSIS DIPSTICK
Bilirubin, UA: NEGATIVE
Blood, UA: NEGATIVE
Glucose, UA: 250
Ketones, UA: NEGATIVE
Leukocytes, UA: NEGATIVE
Nitrite, UA: NEGATIVE
Spec Grav, UA: >=1.03
Urobilinogen, UA: 0.2
pH, UA: 5

## 2012-06-09 MED ORDER — METFORMIN HCL ER 750 MG PO TB24: ORAL_TABLET | ORAL | Status: DC

## 2012-06-09 NOTE — Progress Notes (Signed)
  Subjective:    Patient ID: Andrea Marks, female    DOB: Sep 14, 1951, 61 y.o.   MRN: [id number]  HPI Pt here to f/u and c/o inc stress ---many friends and her husband have been sick and she is taking care of everyone.   HYPERTENSION Disease Monitoring Blood pressure range-not checking at home Chest pain- no      Dyspnea- no Medications Compliance- good Lightheadedness- no   Edema- yes   DIABETES Disease Monitoring Blood Sugar ranges-not check ing  Polyuria- no New Visual problems- no Medications Compliance- good Hypoglycemic symptoms- no   HYPERLIPIDEMIA Disease Monitoring See symptoms for Hypertension Medications Compliance- good RUQ pain- no  Muscle aches- no  ROS See HPI above   PMH Smoking Status noted     Review of Systems As above    Objective:   Physical Exam  Constitutional: She is oriented to person, place, and time. She appears well-developed and well-nourished.  Cardiovascular: Normal rate and regular rhythm.   Musculoskeletal: Normal range of motion. She exhibits no edema and no tenderness.  Neurological: She is alert and oriented to person, place, and time.  Psychiatric: She has a normal mood and affect. Her behavior is normal. Judgment and thought content normal.          Assessment & Plan:

## 2012-06-09 NOTE — Patient Instructions (Signed)
Edema Edema is an abnormal build-up of fluids in tissues. Because this is partly dependent on gravity (water flows to the lowest place), it is more common in the leg sand thighs (lower extremities). It is also common in the looser tissues, like around the eyes. Painless swelling of the feet and ankles is common and increases as a person ages. It may affect both legs and may include the calves or even thighs. When squeezed, the fluid may move out of the affected area and may leave a dent for a few moments. CAUSES   Prolonged standing or sitting in one place for extended periods of time. Movement helps pump tissue fluid into the veins, and absence of movement prevents this, resulting in edema.   Varicose veins. The valves in the veins do not work as well as they should. This causes fluid to leak into the tissues.   Fluid and salt overload.   Injury, burn, or surgery to the leg, ankle, or foot, may damage veins and allow fluid to leak out.   Sunburn damages vessels. Leaky vessels allow fluid to go out into the sunburned tissues.   Allergies (from insect bites or stings, medications or chemicals) cause swelling by allowing vessels to become leaky.   Protein in the blood helps keep fluid in your vessels. Low protein, as in malnutrition, allows fluid to leak out.   Hormonal changes, including pregnancy and menstruation, cause fluid retention. This fluid may leak out of vessels and cause edema.   Medications that cause fluid retention. Examples are sex hormones, blood pressure medications, steroid treatment, or anti-depressants.   Some illnesses cause edema, especially heart failure, kidney disease, or liver disease.   Surgery that cuts veins or lymph nodes, such as surgery done for the heart or for breast cancer, may result in edema.  DIAGNOSIS  Your caregiver is usually easily able to determine what is causing your swelling (edema) by simply asking what is wrong (getting a history) and examining  you (doing a physical). Sometimes x-rays, EKG (electrocardiogram or heart tracing), and blood work may be done to evaluate for underlying medical illness. TREATMENT  General treatment includes:  Leg elevation (or elevation of the affected body part).   Restriction of fluid intake.   Prevention of fluid overload.   Compression of the affected body part. Compression with elastic bandages or support stockings squeezes the tissues, preventing fluid from entering and forcing it back into the blood vessels.   Diuretics (also called water pills or fluid pills) pull fluid out of your body in the form of increased urination. These are effective in reducing the swelling, but can have side effects and must be used only under your caregiver's supervision. Diuretics are appropriate only for some types of edema.  The specific treatment can be directed at any underlying causes discovered. Heart, liver, or kidney disease should be treated appropriately. HOME CARE INSTRUCTIONS   Elevate the legs (or affected body part) above the level of the heart, while lying down.   Avoid sitting or standing still for prolonged periods of time.   Avoid putting anything directly under the knees when lying down, and do not wear constricting clothing or garters on the upper legs.   Exercising the legs causes the fluid to work back into the veins and lymphatic channels. This may help the swelling go down.   The pressure applied by elastic bandages or support stockings can help reduce ankle swelling.   A low-salt diet may help reduce fluid   retention and decrease the ankle swelling.   Take any medications exactly as prescribed.  SEEK MEDICAL CARE IF:  Your edema is not responding to recommended treatments. SEEK IMMEDIATE MEDICAL CARE IF:   You develop shortness of breath or chest pain.   You cannot breathe when you lay down; or if, while lying down, you have to get up and go to the window to get your breath.   You  are having increasing swelling without relief from treatment.   You develop a fever over 102 F (38.9 C).   You develop pain or redness in the areas that are swollen.   Tell your caregiver right away if you have gained 3 lb/1.4 kg in 1 day or 5 lb/2.3 kg in a week.  MAKE SURE YOU:   Understand these instructions.   Will watch your condition.   Will get help right away if you are not doing well or get worse.  Document Released: 10/18/2005 Document Revised: 10/07/2011 Document Reviewed: 06/05/2008 Exit[employer] Patient Information 2012 [employer]. 

## 2012-06-10 LAB — BASIC METABOLIC PANEL
BUN: 12 mg/dL (ref 6–23)
CO2: 27 mEq/L (ref 19–32)
Calcium: 9.5 mg/dL (ref 8.4–10.5)
Chloride: 101 mEq/L (ref 96–112)
Creat: 0.71 mg/dL (ref 0.50–1.10)
Glucose, Bld: 235 mg/dL — ABNORMAL HIGH (ref 70–99)
Potassium: 4.4 mEq/L (ref 3.5–5.3)
Sodium: 140 mEq/L (ref 135–145)

## 2012-06-10 LAB — LIPID PANEL
Cholesterol: 120 mg/dL (ref 0–200)
HDL: 53 mg/dL (ref >39)
LDL Cholesterol: 33 mg/dL (ref 0–99)
Total CHOL/HDL Ratio: 2.3 Ratio
Triglycerides: 168 mg/dL — ABNORMAL HIGH (ref <150)
VLDL: 34 mg/dL (ref 0–40)

## 2012-06-10 LAB — HEPATIC FUNCTION PANEL
ALT: 19 U/L (ref 0–35)
AST: 21 U/L (ref 0–37)
Albumin: 3.7 g/dL (ref 3.5–5.2)
Alkaline Phosphatase: 49 U/L (ref 39–117)
Bilirubin, Direct: 0.1 mg/dL (ref 0.0–0.3)
Indirect Bilirubin: 0.4 mg/dL (ref 0.0–0.9)
Total Bilirubin: 0.5 mg/dL (ref 0.3–1.2)
Total Protein: 6.1 g/dL (ref 6.0–8.3)

## 2012-06-10 LAB — MICROALBUMIN / CREATININE URINE RATIO
Creatinine, Urine: 196.4 mg/dL
Microalb Creat Ratio: 15.3 mg/g (ref 0.0–30.0)
Microalb, Ur: 3 mg/dL — ABNORMAL HIGH (ref 0.00–1.89)

## 2012-06-10 LAB — HEMOGLOBIN A1C
Hgb A1c MFr Bld: 8.8 % — ABNORMAL HIGH (ref <5.7)
Mean Plasma Glucose: 206 mg/dL — ABNORMAL HIGH (ref <117)

## 2012-06-10 LAB — TSH: TSH: 2.881 u[IU]/mL (ref 0.350–4.500)

## 2012-06-10 NOTE — Assessment & Plan Note (Signed)
Recheck labs  con't meds  

## 2012-06-10 NOTE — Assessment & Plan Note (Signed)
con't meds  Check labs 

## 2012-06-10 NOTE — Assessment & Plan Note (Signed)
Stable Cont meds 

## 2012-06-10 NOTE — Assessment & Plan Note (Signed)
con't meds Pt does not want to change anything at this time or go to counseling

## 2012-06-21 MED ORDER — GLIMEPIRIDE 2 MG PO TABS: 2.0000 mg | ORAL_TABLET | Freq: Two times a day (BID) | ORAL | Status: DC

## 2012-07-25 ENCOUNTER — Other Ambulatory Visit: Payer: Self-pay | Admitting: Family Medicine

## 2012-07-25 NOTE — Telephone Encounter (Signed)
Rx sent.    MW 

## 2012-08-30 ENCOUNTER — Other Ambulatory Visit: Payer: Self-pay | Admitting: Family Medicine

## 2012-08-31 ENCOUNTER — Other Ambulatory Visit: Payer: Self-pay | Admitting: Family Medicine

## 2012-09-01 ENCOUNTER — Other Ambulatory Visit: Payer: Self-pay | Admitting: Family Medicine

## 2012-09-01 MED ORDER — GLIMEPIRIDE 2 MG PO TABS: 2.0000 mg | ORAL_TABLET | Freq: Two times a day (BID) | ORAL | Status: DC

## 2012-09-01 MED ORDER — GLIMEPIRIDE 2 MG PO TABS: ORAL_TABLET | ORAL | Status: DC

## 2012-09-01 NOTE — Addendum Note (Signed)
Addended by: Arnette Norris on: 09/01/2012 10:34 AM   Modules accepted: Orders

## 2012-09-01 NOTE — Addendum Note (Signed)
Addended by: Arnette Norris on: 09/01/2012 10:33 AM   Modules accepted: Orders

## 2012-09-08 ENCOUNTER — Encounter: Payer: Self-pay | Admitting: Family Medicine

## 2012-09-08 ENCOUNTER — Telehealth: Payer: Self-pay | Admitting: Family Medicine

## 2012-09-08 ENCOUNTER — Ambulatory Visit (INDEPENDENT_AMBULATORY_CARE_PROVIDER_SITE_OTHER): Payer: 59 | Admitting: Family Medicine

## 2012-09-08 VITALS — BP 130/70 | HR 90 | Temp 98.4°F | Wt 301.6 lb

## 2012-09-08 DIAGNOSIS — E785 Hyperlipidemia, unspecified: Secondary | ICD-10-CM

## 2012-09-08 DIAGNOSIS — E119 Type 2 diabetes mellitus without complications: Secondary | ICD-10-CM

## 2012-09-08 DIAGNOSIS — R197 Diarrhea, unspecified: Secondary | ICD-10-CM

## 2012-09-08 MED ORDER — GLIMEPIRIDE 2 MG PO TABS: 2.0000 mg | ORAL_TABLET | Freq: Two times a day (BID) | ORAL | Status: DC

## 2012-09-08 NOTE — Progress Notes (Signed)
  Subjective:     Andrea Marks is a 61 y.o. female who presents for evaluation of diarrhea. Onset of diarrhea was 1 week ago. Diarrhea is occurring approximately 3 times per day. Patient describes diarrhea as watery. Diarrhea has been associated with abdominal pain described as cramping and household contacts with similar illness. Patient denies blood in stool, fever, recent antibiotic use, recent camping, recent travel, significant abdominal pain, unintentional weight loss. Previous visits for diarrhea: none. Evaluation to date: none.  Treatment to date: otc.  The following portions of the patient's history were reviewed and updated as appropriate: allergies, current medications, past family history, past medical history, past social history, past surgical history and problem list.  Review of Systems Pertinent items are noted in HPI.    Objective:    BP 130/70  Pulse 90  Temp 98.4 F (36.9 C) (Oral)  Wt 301 lb 9.6 oz (136.805 kg)  SpO2 95% General: alert, cooperative, appears stated age and no distress  Hydration:  well hydrated  Abdomen:    soft, non-tender; bowel sounds normal; no masses,  no organomegaly    Assessment:    Gastroenteritis, likely viral; mild in severity   Plan:    Appropriate educational material discussed and distributed. Discussed the appropriate management of diarrhea. Follow up as needed.

## 2012-09-08 NOTE — Addendum Note (Signed)
Addended by: Arnette Norris on: 09/08/2012 05:30 PM   Modules accepted: Orders

## 2012-09-08 NOTE — Telephone Encounter (Signed)
refill for glimeipiride ---was sent to [facility] 11.1.13, patient stated they did not get but [facility] [id number]

## 2012-09-08 NOTE — Patient Instructions (Signed)
Diarrhea Infections caused by germs (bacterial) or a virus commonly cause diarrhea. Your caregiver has determined that with time, rest and fluids, the diarrhea should improve. In general, eat normally while drinking more water than usual. Although water may prevent dehydration, it does not contain salt and minerals (electrolytes). Broths, weak tea without caffeine and oral rehydration solutions (ORS) replace fluids and electrolytes. Small amounts of fluids should be taken frequently. Large amounts at one time may not be tolerated. Plain water may be harmful in infants and the elderly. Oral rehydrating solutions (ORS) are available at pharmacies and grocery stores. ORS replace water and important electrolytes in proper proportions. Sports drinks are not as effective as ORS and may be harmful due to sugars worsening diarrhea.  ORS is especially recommended for use in children with diarrhea. As a general guideline for children, replace any new fluid losses from diarrhea and/or vomiting with ORS as follows:  If your child weighs 22 pounds or under (10 kg or less), give 60-120 mL ( -  cup or 2 - 4 ounces) of ORS for each episode of diarrheal stool or vomiting episode.  If your child weighs more than 22 pounds (more than 10 kgs), give 120-240 mL ( - 1 cup or 4 - 8 ounces) of ORS for each diarrheal stool or episode of vomiting.  While correcting for dehydration, children should eat normally. However, foods high in sugar should be avoided because this may worsen diarrhea. Large amounts of carbonated soft drinks, juice, gelatin desserts and other highly sugared drinks should be avoided.  After correction of dehydration, other liquids that are appealing to the child may be added. Children should drink small amounts of fluids frequently and fluids should be increased as tolerated. Children should drink enough fluids to keep urine clear or pale yellow.  Adults should eat normally while drinking more fluids than  usual. Drink small amounts of fluids frequently and increase as tolerated. Drink enough fluids to keep urine clear or pale yellow. Broths, weak decaffeinated tea, lemon lime soft drinks (allowed to go flat) and ORS replace fluids and electrolytes.  Avoid:  Carbonated drinks.  Juice.  Extremely hot or cold fluids.  Caffeine drinks.  Fatty, greasy foods.  Alcohol.  Tobacco.  Too much intake of anything at one time.  Gelatin desserts.  Probiotics are active cultures of beneficial bacteria. They may lessen the amount and number of diarrheal stools in adults. Probiotics can be found in yogurt with active cultures and in supplements.  Wash hands well to avoid spreading bacteria and virus.  Anti-diarrheal medications are not recommended for infants and children.  Only take over-the-counter or prescription medicines for pain, discomfort or fever as directed by your caregiver. Do not give aspirin to children because it may cause Reye's Syndrome.  For adults, ask your caregiver if you should continue all prescribed and over-the-counter medicines.  If your caregiver has given you a follow-up appointment, it is very important to keep that appointment. Not keeping the appointment could result in a chronic or permanent injury, and disability. If there is any problem keeping the appointment, you must call back to this facility for assistance. SEEK IMMEDIATE MEDICAL CARE IF:   You or your child is unable to keep fluids down or other symptoms or problems become worse in spite of treatment.  Vomiting or diarrhea develops and becomes persistent.  There is vomiting of blood or bile (green material).  There is blood in the stool or the stools are black and   tarry.  There is no urine output in 6-8 hours or there is only a small amount of very dark urine.  Abdominal pain develops, increases or localizes.  You have a fever.  Your baby is older than 3 months with a rectal temperature of 102 F  (38.9 C) or higher.  Your baby is 3 months old or younger with a rectal temperature of 100.4 F (38 C) or higher.  You or your child develops excessive weakness, dizziness, fainting or extreme thirst.  You or your child develops a rash, stiff neck, severe headache or become irritable or sleepy and difficult to awaken. MAKE SURE YOU:   Understand these instructions.  Will watch your condition.  Will get help right away if you are not doing well or get worse. Document Released: 10/08/2002 Document Revised: 01/10/2012 Document Reviewed: 08/25/2009 ExitCare Patient Information 2013 [employer].  

## 2012-09-09 ENCOUNTER — Other Ambulatory Visit: Payer: Self-pay | Admitting: Family Medicine

## 2012-09-11 ENCOUNTER — Other Ambulatory Visit: Payer: 59

## 2012-09-11 DIAGNOSIS — E785 Hyperlipidemia, unspecified: Secondary | ICD-10-CM

## 2012-09-11 DIAGNOSIS — E119 Type 2 diabetes mellitus without complications: Secondary | ICD-10-CM

## 2012-09-11 LAB — BASIC METABOLIC PANEL
BUN: 14 mg/dL (ref 6–23)
CO2: 27 mEq/L (ref 19–32)
Calcium: 8.7 mg/dL (ref 8.4–10.5)
Chloride: 100 mEq/L (ref 96–112)
Creatinine, Ser: 0.8 mg/dL (ref 0.4–1.2)
GFR: 82.08 mL/min (ref >60.00)
Glucose, Bld: 159 mg/dL — ABNORMAL HIGH (ref 70–99)
Potassium: 4.2 mEq/L (ref 3.5–5.1)
Sodium: 137 mEq/L (ref 135–145)

## 2012-09-11 LAB — LIPID PANEL
Cholesterol: 107 mg/dL (ref 0–200)
HDL: 55.2 mg/dL (ref >39.00)
LDL Cholesterol: 33 mg/dL (ref 0–99)
Total CHOL/HDL Ratio: 2
Triglycerides: 93 mg/dL (ref 0.0–149.0)
VLDL: 18.6 mg/dL (ref 0.0–40.0)

## 2012-09-11 LAB — MICROALBUMIN / CREATININE URINE RATIO
Creatinine,U: 201.6 mg/dL
Microalb Creat Ratio: 1 mg/g (ref 0.0–30.0)
Microalb, Ur: 2.1 mg/dL — ABNORMAL HIGH (ref 0.0–1.9)

## 2012-09-11 LAB — HEPATIC FUNCTION PANEL
ALT: 16 U/L (ref 0–35)
AST: 20 U/L (ref 0–37)
Albumin: 3.1 g/dL — ABNORMAL LOW (ref 3.5–5.2)
Alkaline Phosphatase: 43 U/L (ref 39–117)
Bilirubin, Direct: 0.2 mg/dL (ref 0.0–0.3)
Total Bilirubin: 0.6 mg/dL (ref 0.3–1.2)
Total Protein: 6.3 g/dL (ref 6.0–8.3)

## 2012-09-11 LAB — HEMOGLOBIN A1C: Hgb A1c MFr Bld: 7.9 % — ABNORMAL HIGH (ref 4.6–6.5)

## 2012-09-21 ENCOUNTER — Encounter: Payer: Self-pay | Admitting: Family Medicine

## 2012-09-22 MED ORDER — GLIMEPIRIDE 2 MG PO TABS: 2.0000 mg | ORAL_TABLET | Freq: Two times a day (BID) | ORAL | Status: DC

## 2012-10-15 ENCOUNTER — Other Ambulatory Visit: Payer: Self-pay | Admitting: Family Medicine

## 2012-11-12 ENCOUNTER — Other Ambulatory Visit: Payer: Self-pay | Admitting: Family Medicine

## 2012-12-16 ENCOUNTER — Other Ambulatory Visit: Payer: Self-pay

## 2013-01-11 ENCOUNTER — Telehealth: Payer: Self-pay | Admitting: Radiology

## 2013-01-11 ENCOUNTER — Telehealth: Payer: Self-pay | Admitting: Family Medicine

## 2013-01-11 NOTE — Telephone Encounter (Signed)
Patient states her insurance will not cover victoza anymore and she has already discussed an alternative with Dr. Laury Axon. Pt is scheduled for Tuesday, 3/18, but will need meds before then. [facility] uses [facility].

## 2013-01-11 NOTE — Telephone Encounter (Signed)
Please advise      KP 

## 2013-01-11 NOTE — Telephone Encounter (Signed)
We showed her Bydurion--- 1x a week injection but she did not want to do it

## 2013-01-11 NOTE — Telephone Encounter (Signed)
Note for jury duty faxed to her.

## 2013-01-12 MED ORDER — LIRAGLUTIDE 18 MG/3ML ~~LOC~~ SOLN: SUBCUTANEOUS | Status: DC

## 2013-01-12 NOTE — Telephone Encounter (Signed)
msg left to call the office     KP 

## 2013-01-12 NOTE — Telephone Encounter (Signed)
We may need to ask again to make sure she wants to try it--she will probably need to be brought in to get taught how again

## 2013-01-12 NOTE — Telephone Encounter (Signed)
Patient returned your call.

## 2013-01-12 NOTE — Telephone Encounter (Signed)
PA initiated for the Victo[facility].      KP

## 2013-01-12 NOTE — Telephone Encounter (Signed)
Did you want me to call in the script?   Please advise    KP

## 2013-01-15 MED ORDER — LIRAGLUTIDE 18 MG/3ML ~~LOC~~ SOLN: SUBCUTANEOUS | Status: DC

## 2013-01-15 NOTE — Telephone Encounter (Signed)
Prior Auth approved 12-14-12 until 01-13-14, approval letter scan to chart.

## 2013-01-15 NOTE — Telephone Encounter (Signed)
Patient aware Rx approved       KP

## 2013-01-16 ENCOUNTER — Ambulatory Visit: Payer: 59 | Admitting: Family Medicine

## 2013-01-18 ENCOUNTER — Encounter: Payer: Self-pay | Admitting: Family Medicine

## 2013-01-18 ENCOUNTER — Other Ambulatory Visit: Payer: Self-pay | Admitting: Family Medicine

## 2013-01-18 ENCOUNTER — Ambulatory Visit (INDEPENDENT_AMBULATORY_CARE_PROVIDER_SITE_OTHER): Payer: 59 | Admitting: Family Medicine

## 2013-01-18 VITALS — BP 118/60 | HR 104 | Wt 306.0 lb

## 2013-01-18 DIAGNOSIS — IMO0002 Reserved for concepts with insufficient information to code with codable children: Secondary | ICD-10-CM

## 2013-01-18 DIAGNOSIS — I10 Essential (primary) hypertension: Secondary | ICD-10-CM

## 2013-01-18 DIAGNOSIS — E1165 Type 2 diabetes mellitus with hyperglycemia: Secondary | ICD-10-CM

## 2013-01-18 DIAGNOSIS — M79609 Pain in unspecified limb: Secondary | ICD-10-CM

## 2013-01-18 DIAGNOSIS — N390 Urinary tract infection, site not specified: Secondary | ICD-10-CM

## 2013-01-18 DIAGNOSIS — M79641 Pain in right hand: Secondary | ICD-10-CM

## 2013-01-18 DIAGNOSIS — E785 Hyperlipidemia, unspecified: Secondary | ICD-10-CM

## 2013-01-18 DIAGNOSIS — E119 Type 2 diabetes mellitus without complications: Secondary | ICD-10-CM

## 2013-01-18 DIAGNOSIS — E118 Type 2 diabetes mellitus with unspecified complications: Secondary | ICD-10-CM

## 2013-01-18 LAB — POCT URINALYSIS DIPSTICK
Bilirubin, UA: NEGATIVE
Blood, UA: NEGATIVE
Glucose, UA: NEGATIVE
Ketones, UA: NEGATIVE
Nitrite, UA: NEGATIVE
Protein, UA: NEGATIVE
Spec Grav, UA: >=1.03
Urobilinogen, UA: 0.2
pH, UA: 6

## 2013-01-18 MED ORDER — GLIMEPIRIDE 2 MG PO TABS: ORAL_TABLET | ORAL | Status: DC

## 2013-01-18 MED ORDER — EZETIMIBE-SIMVASTATIN 10-40 MG PO TABS: ORAL_TABLET | ORAL | Status: DC

## 2013-01-18 NOTE — Assessment & Plan Note (Signed)
Check labs con't meds 

## 2013-01-18 NOTE — Progress Notes (Signed)
  Subjective:    Patient ID: Andrea Marks, female    DOB: Jul 27, 1951, 62 y.o.   MRN: [id number]  HPI HYPERTENSION Disease Monitoring Blood pressure range-not checking Chest pain- no      Dyspnea- no Medications Compliance- good Lightheadedness- no   Edema- yes   DIABETES Disease Monitoring Blood Sugar ranges-not checking  Polyuria- no New Visual problems- no Medications Compliance- good Hypoglycemic symptoms- no   HYPERLIPIDEMIA Disease Monitoring See symptoms for Hypertension Medications Compliance- good-----ran out   RUQ pain- no  Muscle aches- no  ROS See HPI above   PMH Smoking Status noted     Review of Systems As above    Objective:   Physical Exam BP 118/60  Pulse 104  Wt 306 lb (138.801 kg)  BMI 52.5 kg/m2  SpO2 96% General appearance: alert, cooperative, appears stated age and no distress Lungs: clear to auscultation bilaterally Heart: S1, S2 normal Extremities: edema + 2 pitting edema b/l Sensory exam of the foot is normal, tested with the monofilament. Good pulses, no lesions or ulcers, good peripheral pulses.       Assessment & Plan:

## 2013-01-18 NOTE — Patient Instructions (Addendum)

## 2013-01-18 NOTE — Assessment & Plan Note (Signed)
con't meds 

## 2013-01-19 LAB — LIPID PANEL
Cholesterol: 118 mg/dL (ref 0–200)
HDL: 55.9 mg/dL (ref >39.00)
LDL Cholesterol: 26 mg/dL (ref 0–99)
Total CHOL/HDL Ratio: 2
Triglycerides: 182 mg/dL — ABNORMAL HIGH (ref 0.0–149.0)
VLDL: 36.4 mg/dL (ref 0.0–40.0)

## 2013-01-19 LAB — HEPATIC FUNCTION PANEL
ALT: 16 U/L (ref 0–35)
AST: 30 U/L (ref 0–37)
Albumin: 3.2 g/dL — ABNORMAL LOW (ref 3.5–5.2)
Alkaline Phosphatase: 51 U/L (ref 39–117)
Bilirubin, Direct: 0.1 mg/dL (ref 0.0–0.3)
Total Bilirubin: 0.5 mg/dL (ref 0.3–1.2)
Total Protein: 6.6 g/dL (ref 6.0–8.3)

## 2013-01-19 LAB — BASIC METABOLIC PANEL
BUN: 14 mg/dL (ref 6–23)
CO2: 26 mEq/L (ref 19–32)
Calcium: 9.5 mg/dL (ref 8.4–10.5)
Chloride: 100 mEq/L (ref 96–112)
Creatinine, Ser: 0.7 mg/dL (ref 0.4–1.2)
GFR: 85.88 mL/min (ref >60.00)
Glucose, Bld: 275 mg/dL — ABNORMAL HIGH (ref 70–99)
Potassium: 4.4 mEq/L (ref 3.5–5.1)
Sodium: 136 mEq/L (ref 135–145)

## 2013-01-19 LAB — HEMOGLOBIN A1C: Hgb A1c MFr Bld: 9.3 % — ABNORMAL HIGH (ref 4.6–6.5)

## 2013-01-22 ENCOUNTER — Telehealth: Payer: Self-pay | Admitting: *Deleted

## 2013-01-22 LAB — URINE CULTURE: Colony Count: >=100000

## 2013-01-22 MED ORDER — CIPROFLOXACIN HCL 500 MG PO TABS: 500.0000 mg | ORAL_TABLET | Freq: Two times a day (BID) | ORAL | Status: DC

## 2013-01-22 NOTE — Telephone Encounter (Signed)
Message copied by Verdene Rio on Mon Jan 22, 2013  6:10 PM ------      Message from: Lelon Perla      Created: Mon Jan 22, 2013  1:02 PM       + UTI-- cipro 500 mg 1 po bid for 5 days ------

## 2013-03-16 ENCOUNTER — Other Ambulatory Visit: Payer: Self-pay | Admitting: Family Medicine

## 2013-03-29 ENCOUNTER — Other Ambulatory Visit: Payer: Self-pay | Admitting: Family Medicine

## 2013-03-29 ENCOUNTER — Ambulatory Visit (INDEPENDENT_AMBULATORY_CARE_PROVIDER_SITE_OTHER): Payer: 59 | Admitting: Family Medicine

## 2013-03-29 ENCOUNTER — Encounter: Payer: Self-pay | Admitting: Family Medicine

## 2013-03-29 VITALS — BP 116/64 | HR 99 | Temp 99.0°F | Wt 298.0 lb

## 2013-03-29 DIAGNOSIS — N390 Urinary tract infection, site not specified: Secondary | ICD-10-CM

## 2013-03-29 DIAGNOSIS — B373 Candidiasis of vulva and vagina: Secondary | ICD-10-CM

## 2013-03-29 DIAGNOSIS — B3731 Acute candidiasis of vulva and vagina: Secondary | ICD-10-CM

## 2013-03-29 DIAGNOSIS — E1065 Type 1 diabetes mellitus with hyperglycemia: Secondary | ICD-10-CM

## 2013-03-29 DIAGNOSIS — IMO0002 Reserved for concepts with insufficient information to code with codable children: Secondary | ICD-10-CM

## 2013-03-29 DIAGNOSIS — B3749 Other urogenital candidiasis: Secondary | ICD-10-CM

## 2013-03-29 DIAGNOSIS — R81 Glycosuria: Secondary | ICD-10-CM

## 2013-03-29 LAB — POCT URINALYSIS DIPSTICK
Glucose, UA: 2000
Nitrite, UA: NEGATIVE
Protein, UA: NEGATIVE
Spec Grav, UA: >=1.03
Urobilinogen, UA: 0.2
pH, UA: 5

## 2013-03-29 LAB — GLUCOSE, POCT (MANUAL RESULT ENTRY): POC Glucose: 265 mg/dl — AB (ref 70–99)

## 2013-03-29 MED ORDER — FLUCONAZOLE 150 MG PO TABS: ORAL_TABLET | ORAL | Status: DC

## 2013-03-29 MED ORDER — GLIMEPIRIDE 4 MG PO TABS: ORAL_TABLET | ORAL | Status: DC

## 2013-03-29 MED ORDER — CIPROFLOXACIN HCL 500 MG PO TABS: 500.0000 mg | ORAL_TABLET | Freq: Two times a day (BID) | ORAL | Status: DC

## 2013-03-29 NOTE — Patient Instructions (Addendum)
Diabetes and Sick Day Management Blood sugar (glucose) can be more difficult to control when you are sick. Colds, fever, flu, nausea, vomiting, and diarrhea are all examples of common illnesses that can cause problems for people with diabetes. Loss of body fluids (dehydration) from fever, vomiting, diarrhea, infection, and the stress of a sickness can all cause blood glucose levels to rise. Because of this, it is very important to take your diabetes medicines and to eat some form of carbohydrate food when you are sick. Liquid or soft foods are often tolerated, and they help to replace fluids. HOME CARE INSTRUCTIONS These main guidelines are intended for managing a short-term (24 hours or less) sickness:  Take your usual dose of insulin or oral diabetes medicine. An exception would be if you take any form of metformin. If you cannot eat or drink, you can become dehydrated and should not take this medicine.  Continue to take your insulin even if you are unable to eat solid foods or are vomiting. Your insulin dose may stay the same, or it may need to be increased when you are sick.  You will need to test your blood glucose more often, generally every 2 4 hours. If you have type 1 diabetes, test your urine for ketones every 4 hours. If you have type 2 diabetes, test your urine for ketones as directed by your caregiver.  Eat some form of food that contains carbohydrates. The carbohydrates can be in solid or liquid form. You should eat 45 50 grams of carbohydrates every 3 4 hours.  Replace fluids if fever, vomiting, or diarrhea is present. Ask your caregiver for specific rehydration instructions.  Watch carefully for the signs of ketoacidosis if you have type 1 diabetes. Call your caregiver if any of the following symptoms are present, especially in children:  Moderate to large ketones in the urine along with a high blood glucose level.  Severe nausea.  Vomiting.  Diarrhea.  Abdominal  pain.  Rapid breathing.  Drink extra liquids that do not contain sugar, such as water or sugar-free liquids, if your blood glucose is higher than 240 mg/dl.  Be careful with over-the-counter medicines. Read the labels. They may contain sugar or types of sugars that can raise your blood glucose. Food Choices for Illness All of the food choices below contain around 15 grams of carbohydrates. Plan ahead and keep some of these foods around.    to  cup carbonated beverage containing sugar. Carbonated beverages will usually be better tolerated if they are opened and left at room temperature for a few minutes.   of a twin frozen ice pop.   cup sweetened gelatin dessert.   cup juice.   cup ice cream or frozen yogurt.   cup cooked cereal.   cup sherbet.  1 cup broth-based soup with noodles or rice, reconstituted with water.  1 cup cream soup.   cup regular custard.   cup regular pudding.  1 cup sports drink.  1 cup plain yogurt.  1 slice toast.  6 squares saltine crackers.  5 vanilla wafers. SEEK MEDICAL CARE IF:   You are unable to eat food or drink fluids for more than 6 hours.  You have nausea and vomiting for more than 6 hours.  Your blood glucose level is over 240 mg/dL.  There is a change in mental status.  You develop an additional serious illness.  You have diarrhea for more than 6 hours.  You have been sick or have had   a fever for a couple of days and are not getting better. SEEK IMMEDIATE MEDICAL CARE IF:  You have difficulty breathing.  You have moderate to large ketone levels. Document Released: 10/21/2003 Document Revised: 07/12/2012 Document Reviewed: 04/27/2011 Exit[employer] Patient Information 2014 [employer].  

## 2013-03-29 NOTE — Progress Notes (Signed)
  Subjective:    [name] is a 62 y.o. female who complains of burning with urination, erythema of vaginal area, suprapubic pressure and vaginal itching. She has had symptoms for several days. Patient also complains of vaginal irritation. Patient denies back pain, congestion, cough, fever, headache, rhinitis, sorethroat, stomach ache and vaginal discharge. Patient does not have a history of recurrent UTI. Patient does not have a history of pyelonephritis.   The following portions of the patient's history were reviewed and updated as appropriate: allergies, current medications, past family history, past medical history, past social history, past surgical history and problem list.  Review of Systems Pertinent items are noted in HPI.    Objective:    BP 116/64  Pulse 99  Temp(Src) 99 F (37.2 C) (Oral)  Wt 298 lb (135.172 kg)  BMI 51.13 kg/m2  SpO2 97% General appearance: alert, cooperative, appears stated age and no distress Lungs: clear to auscultation bilaterally Heart: S1, S2 normal  Laboratory:  Urine dipstick: large for hemoglobin, mod for ketones and mod for leukocyte esterase.   Micro exam: not done.    Assessment:    Vulvovaginitis and ?UTI     Plan:    Medications: ciprofloxacin. Maintain adequate hydration. Follow up if symptoms not improving, and as needed.

## 2013-03-31 LAB — URINE CULTURE
Colony Count: NO GROWTH
Organism ID, Bacteria: NO GROWTH

## 2013-04-06 ENCOUNTER — Ambulatory Visit (INDEPENDENT_AMBULATORY_CARE_PROVIDER_SITE_OTHER): Payer: 59 | Admitting: Internal Medicine

## 2013-04-06 ENCOUNTER — Encounter: Payer: Self-pay | Admitting: Internal Medicine

## 2013-04-06 VITALS — BP 130/66 | HR 78 | Temp 97.7°F | Ht 62.0 in | Wt 302.0 lb

## 2013-04-06 DIAGNOSIS — E119 Type 2 diabetes mellitus without complications: Secondary | ICD-10-CM

## 2013-04-06 MED ORDER — METFORMIN HCL ER 750 MG PO TB24: ORAL_TABLET | ORAL | Status: DC

## 2013-04-06 MED ORDER — ONETOUCH ULTRA SYSTEM W/DEVICE KIT: 1.0000 | PACK | Freq: Once | Status: DC

## 2013-04-06 NOTE — Progress Notes (Signed)
Patient ID: Andrea Marks, female   DOB: December 26, 1950, 62 y.o.   MRN: [id number]  HPI: Andrea Marks is a 62 y.o.-year-old female, referred by her PCP, Dr. Laury Axon, for management of DM2, non-insulin-dependent, uncontrolled, without complications.  Patient has been diagnosed with diabetes in 1994; she has not been on insulin before. Last hemoglobin A1c was: Lab Results  Component Value Date   HGBA1C 9.3* 01/18/2013   Previously 7.8 in 09/2012. A lot of stress in her life recently. Had a steroid injection in right hand 1 mo ago.   Pt is on a regimen of: - Metformin 1500 mg po hs (2x750 mg) - Victoza 1.8 mg daily - Amaryl 4 mg bid - increased from 2 mg bid last week  Pt does not check her sugars as she is afraid of needles. Unclear if she has lows since not checking sugars. She has ? hypoglycemia awareness. Highest sugar was 265 on 05/28.  Pt's meals are: - Breakfast: bagel or cereal or yoghurt + cereal, egg mc muffin - Lunch - usually out: tuna/shrimp (Red lobster), sandwich - Dinner: salad, hamburger, steak, chicken - cooks at home - Snacks: 1 or 2  Pt does not have chronic kidney disease, last BUN/creatinine was:  Lab Results  Component Value Date   BUN 14 01/18/2013   CREATININE 0.7 01/18/2013   Last set of lipids: Lab Results  Component Value Date   CHOL 118 01/18/2013   HDL 55.90 01/18/2013   LDLCALC 26 01/18/2013   TRIG 182.0* 01/18/2013   CHOLHDL 2 01/18/2013   Pt's last eye exam was > 1 year (Dr. Nelle Don). No DR. Denies numbness and tingling in her legs. She sees a Risk analyst - Dr. Stacie Acres.   I reviewed her chart and she also has a history of HL, HTN, GERD, depression, h/o thrombophlebitis.  Pt has FH of DM in paternal uncles, half-brother.  ROS: Constitutional: no weight gain/loss, + fatigue, no subjective hyperthermia/hypothermia Eyes: no blurry vision, no xerophthalmia ENT: no sore throat, no nodules palpated in throat, no dysphagia/odynophagia, no  hoarseness Cardiovascular: no CP/SOB/palpitations/+ leg swelling Respiratory: no cough/SOB Gastrointestinal: no N/V/D/C Musculoskeletal: + muscle pain /+ joint aches Skin: no rashes, easy bruising - large bruise on right shin Neurological: no tremors/numbness/tingling/dizziness Psychiatric: no depression/anxiety  Past Medical History  Diagnosis Date  . Depression   . Diabetes mellitus   . GERD (gastroesophageal reflux disease)   . Hyperlipidemia   . Hypertension   . Obesity    Past Surgical History  Procedure Laterality Date  . Abdominal hysterectomy  1991    BSO   History   Social History  . Marital Status: Married    Spouse Name: N/A    Number of Children: 0  . Years of Education: 13   Occupational History  . retired from [employer]    Social History Main Topics  . Smoking status: Former Smoker, quit 15 years ago  . Smokeless tobacco: Never Used  . Alcohol Use: No  . Drug Use: No  . Sexually Active: Yes -- Female partner(s)   Social History Narrative   No reg exercise   Current Outpatient Prescriptions on File Prior to Visit  Medication Sig Dispense Refill  . acetaminophen (TYLENOL ARTHRITIS PAIN) 650 MG CR tablet Take 650 mg by mouth 2 (two) times daily as needed. For pain      . aspirin 81 MG tablet Take 81 mg by mouth daily.        . benazepril (LOTENSIN) 20 MG  tablet TAKE 1 TABLET DAILY  90 tablet  1  . docusate sodium (COLACE) 100 MG capsule Take 100 mg by mouth every other day.        . ezetimibe-simvastatin (VYTORIN) 10-40 MG per tablet 1 po qhs  30 tablet  0  . fluconazole (DIFLUCAN) 150 MG tablet 1 po qd x1 , may repeat in 3 days prn  2 tablet  2  . FLUoxetine (PROZAC) 20 MG capsule TAKE 3 CAPSULES DAILY  270 capsule  1  . glimepiride (AMARYL) 4 MG tablet 1 po bid  60 tablet  3  . Liraglutide (VICTOZA) 18 MG/3ML SOLN injection INJECT 1.8 MG TOTAL (0.3 ML) INTO THE SKIN DAILY  27 mL  1  . MENEST 2.5 MG TABS TAKE 1 TABLET EVERY OTHER DAY  45 tablet  1   . NOVOFINE 32G X 6 MM MISC USE 1 NEEDLE UNDER THE SKIN DAILY  100 each  1  . oxaprozin (DAYPRO) 600 MG tablet Take 600 mg by mouth daily.       . ranitidine (ZANTAC) 300 MG capsule TAKE 1 CAPSULE DAILY  90 capsule  2   No current facility-administered medications on file prior to visit.   Allergies  Allergen Reactions  . Hydrocodone-Acetaminophen Other (See Comments)    unknown  . Oxycodone Hcl Other (See Comments)    unknown  . Penicillins Other (See Comments)    unknown  . Prednisone    Family History  Problem Relation Age of Onset  . Macular degeneration Mother   . Heart disease Mother     syncope  . Lung cancer      asbestos  . Lung disease Father 60    mesothelioma  . Cancer Maternal Aunt     stomach  . Heart disease Paternal Uncle     cabg  . Diabetes Paternal Uncle   . Diabetes Paternal Grandmother   . Heart disease Paternal Uncle   . Diabetes Paternal Uncle   . Diabetes Paternal Uncle   . Heart disease Paternal Uncle   . Heart disease Paternal Uncle   . Heart disease Paternal Uncle   . Cancer Other     lung   PE: BP 130/66  Pulse 78  Temp(Src) 97.7 F (36.5 C) (Oral)  Ht 5\' 2"  (1.575 m)  Wt 302 lb (136.986 kg)  BMI 55.22 kg/m2  SpO2 95% Wt Readings from Last 3 Encounters:  04/06/13 302 lb (136.986 kg)  03/29/13 298 lb (135.172 kg)  01/18/13 306 lb (138.801 kg)   Constitutional: obesity type 3, in NAD Eyes: PERRLA, EOMI, no exophthalmos ENT: moist mucous membranes, no thyromegaly, no cervical lymphadenopathy Cardiovascular: RRR, No MRG Respiratory: CTA B Gastrointestinal: abdomen soft, NT, ND, BS+ Musculoskeletal: no deformities, strength intact in all 4 Skin: moist, warm, 5x10 cm painful bruise with a black center on right shin after hitting leg in car door - improving, no erythema around bruise, no fluctuance Neurological: no tremor with outstretched hands, could not elicit DTRs  ASSESSMENT: 1. DM2, non-insulin-dependent, uncontrolled,  without complications  PLAN:  1. 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 She is taking Victoza, with which she does not have a problem. She is not checking her sugars at home and is unclear whether she has hypoglycemia awareness. She is, however, determined to start checking her sugars, which I advised her to do, at least once a day. - We discussed a lot about her diet,  which is disorganized, and with large food portions. She tells me that she likes fruits and vegetables, and needs some more especially during the summer, however is afraid that fruits will increase her sugars. We discussed at length becoming more disciplined about her diet, as a means to get control of her sugars. She would like to hold off a nutrition appt at this visit. - since I do not have sugars to go by, it is difficult to change her diabetic regimen, however we can increase her metformin dose to target. For now, we'll continue her metformin XR 750 mg, but I advised her to add one more tablet at dinner, for a total of 2250 mg, since the tablets cannot be cut. She does not remember having problems with the regular metformin, so we might be able to use this in the future, at target dose 1000 mg bid - continue Victoza 1.8 mg daily - continue Amaryl 4 mg bid - given sugar log and advised how to fill it and to bring it at next appt  - I sent a new Rx for a meter to her pharmacy since she cannot find her One Touch Meter at home. She has lancets and strips - given foot care handout and explained the principles - given instructions for hypoglycemia management "15-15 rule"- Patient is telling me that whenever she feels that her sugars might be low, she drinks diet soda, or eats cheese. None of these have carbs, so I advised her specifically what type of "fast sugars" to get in that situation

## 2013-04-06 NOTE — Patient Instructions (Addendum)
Please take 3 tablets of Metformin with diner. Please write your sugars down - check at least once a day. Please return in 1 month with your sugar log.   Please consider the following ways to cut down carbs and fat and increase fiber and micronutrients in your diet:  - substitute whole grain for white bread or pasta - substitute brown rice for white rice - substitute 90-calorie flat bread pieces for slices of bread when possible - substitute sweet potatoes or yams for white potatoes - substitute humus for margarine - substitute tofu for cheese when possible - substitute almond or rice milk for regular milk (would not drink soy milk daily due to concern for soy estrogen influence on breast cancer risk) - substitute dark chocolate for other sweets when possible - substitute water - can add lemon or orange slices for taste - for diet sodas (artificial sweeteners will trick your body that you can eat sweets without getting calories and will lead you to overeating and weight gain in the long run) - do not skip breakfast or other meals (this will slow down the metabolism and will result in more weight gain over time)  - can try smoothies made from fruit and almond/rice milk in am instead of regular breakfast - can also try old-fashioned (not instant) oatmeal made with almond/rice milk in am - order the dressing on the side when eating salad at a restaurant (pour less than half of the dressing on the salad) - eat as little meat as possible - can try juicing, but should not forget that juicing will get rid of the fiber, so would alternate with eating raw veg./fruits or drinking smoothies - use as little oil as possible, even when using olive oil - can dress a salad with a mix of balsamic vinegar and lemon juice, for e.g. - use agave nectar, stevia sugar, or regular sugar rather than artificial sweateners - steam or broil/roast veggies  - snack on veggies/fruit/nuts (unsalted, preferably) when  possible, rather than processed foods - reduce or eliminate aspartame in diet (it is in diet sodas, chewing gum, etc) Read the labels!  Try to read Dr. Katherina Right book: "Program for Reversing Diabetes" for the vegan concept and other ideas for healthy eating.

## 2013-04-09 ENCOUNTER — Other Ambulatory Visit: Payer: Self-pay | Admitting: *Deleted

## 2013-04-09 MED ORDER — GLUCOSE BLOOD VI STRP: ORAL_STRIP | Status: DC

## 2013-04-09 MED ORDER — ONETOUCH ULTRASOFT LANCETS MISC: Status: DC

## 2013-04-10 ENCOUNTER — Other Ambulatory Visit: Payer: Self-pay | Admitting: *Deleted

## 2013-04-10 MED ORDER — ONETOUCH ULTRASOFT LANCETS MISC: Status: DC

## 2013-04-17 ENCOUNTER — Encounter: Payer: Self-pay | Admitting: Family Medicine

## 2013-04-17 ENCOUNTER — Ambulatory Visit (INDEPENDENT_AMBULATORY_CARE_PROVIDER_SITE_OTHER): Payer: 59 | Admitting: Family Medicine

## 2013-04-17 VITALS — BP 132/78 | HR 94 | Temp 99.6°F | Wt 298.4 lb

## 2013-04-17 DIAGNOSIS — T798XXA Other early complications of trauma, initial encounter: Secondary | ICD-10-CM

## 2013-04-17 DIAGNOSIS — L089 Local infection of the skin and subcutaneous tissue, unspecified: Secondary | ICD-10-CM | POA: Insufficient documentation

## 2013-04-17 DIAGNOSIS — S81809A Unspecified open wound, unspecified lower leg, initial encounter: Secondary | ICD-10-CM

## 2013-04-17 DIAGNOSIS — S81009A Unspecified open wound, unspecified knee, initial encounter: Secondary | ICD-10-CM

## 2013-04-17 DIAGNOSIS — T148XXA Other injury of unspecified body region, initial encounter: Secondary | ICD-10-CM

## 2013-04-17 MED ORDER — DOXYCYCLINE HYCLATE 100 MG PO TABS: 100.0000 mg | ORAL_TABLET | Freq: Two times a day (BID) | ORAL | Status: DC

## 2013-04-17 NOTE — Progress Notes (Signed)
  Subjective:    Patient ID: Andrea Marks, female    DOB: 1950-11-17, 62 y.o.   MRN: [id number]  HPI Pt here c/o wound not healing on R shin after car door caused cut.  It occurred two weeks ago. No other symptoms-- no calf pain.   Review of Systems As above    Objective:   Physical Exam  BP 132/78  Pulse 94  Temp(Src) 99.6 F (37.6 C) (Oral)  Wt 298 lb 6.4 oz (135.353 kg)  BMI 54.56 kg/m2  SpO2 96% General appearance: alert, cooperative, appears stated age and no distress Extremities: + 2 inc diameter wound scabbed with surrounding errythema       Assessment & Plan:

## 2013-04-17 NOTE — Patient Instructions (Signed)
Wound Infection  A wound infection happens when a type of germ (bacteria) starts growing in the wound. In some cases, this can cause the wound to break open. If cared for properly, the infected wound will heal from the inside to the outside. Wound infections need treatment.  CAUSES  An infection is caused by bacteria growing in the wound.   SYMPTOMS    Increase in redness, swelling, or pain at the wound site.   Increase in drainage at the wound site.   Wound or bandage (dressing) starts to smell bad.   Fever.   Feeling tired or fatigued.   Pus draining from the wound.  TREATMENT   You caregiver will prescribe antibiotic medicine. The wound infection should improve within 24 to 48 hours. Any redness around the wound should stop spreading and the wound should be less painful.   HOME CARE INSTRUCTIONS    Only take over-the-counter or prescription medicines for pain, discomfort, or fever as directed by your caregiver.   Take your antibiotics as directed. Finish them even if you start to feel better.   Gently wash the area with mild soap and water 2 times a day, or as directed. Rinse off the soap. Pat the area dry with a clean towel. Do not rub the wound. This may cause bleeding.   Follow your caregiver's instructions for how often you need to change the dressing.   Apply ointment and a dressing to the wound as directed.   If the dressing sticks, moisten it with soapy water and gently remove it.   Change the bandage right away if it becomes wet, dirty, or develops a bad smell.   Take showers. Do not take tub baths, swim, or do anything that may soak the wound until it is healed.   Avoid exercises that make you sweat heavily.   Use anti-itch medicine as directed by your caregiver. The wound may itch when it is healing. Do not pick or scratch at the wound.   Follow up with your caregiver to get your wound rechecked as directed.  SEEK MEDICAL CARE IF:   You have an increase in swelling, pain, or redness  around the wound.   You have an increase in the amount of pus coming from the wound.   There is a bad smell coming from the wound.   More of the wound breaks open.   You have a fever.  MAKE SURE YOU:    Understand these instructions.   Will watch your condition.   Will get help right away if you are not doing well or get worse.  Document Released: 07/17/2003 Document Revised: 01/10/2012 Document Reviewed: 02/21/2011  ExitCare Patient Information 2014 [employer].

## 2013-04-17 NOTE — Assessment & Plan Note (Signed)
Doxy for 10 days Refer to wound clinic for debridement

## 2013-04-18 ENCOUNTER — Telehealth: Payer: Self-pay | Admitting: Family Medicine

## 2013-04-18 NOTE — Telephone Encounter (Signed)
In reference to Wound Care referral, [facility] is unable to see patient until 05/08/13.  This is there absolute 1st available unless there is a cancellation.  This is normal scheduling when I call to refer patients, but they do utilize their cancellation list.  Patient is aware, and very concerned that appointment is so far away.  Please advise.

## 2013-04-18 NOTE — Telephone Encounter (Signed)
Is the wound healing?  We will have her f/u with [place] ---- when she finishes her abx.

## 2013-04-19 ENCOUNTER — Encounter: Payer: Self-pay | Admitting: Family Medicine

## 2013-04-19 ENCOUNTER — Other Ambulatory Visit: Payer: Self-pay | Admitting: Family Medicine

## 2013-04-19 MED ORDER — LIRAGLUTIDE 18 MG/3ML ~~LOC~~ SOPN: 1.8000 mg | PEN_INJECTOR | Freq: Every day | SUBCUTANEOUS | Status: DC

## 2013-04-19 NOTE — Telephone Encounter (Signed)
Spoke with patient and a follow up was scheduled for Tues at 10.    KP

## 2013-04-24 ENCOUNTER — Encounter: Payer: Self-pay | Admitting: Family Medicine

## 2013-04-24 ENCOUNTER — Ambulatory Visit (INDEPENDENT_AMBULATORY_CARE_PROVIDER_SITE_OTHER): Payer: 59 | Admitting: Family Medicine

## 2013-04-24 VITALS — BP 108/55 | HR 84 | Temp 98.2°F | Wt 296.2 lb

## 2013-04-24 DIAGNOSIS — S81009A Unspecified open wound, unspecified knee, initial encounter: Secondary | ICD-10-CM

## 2013-04-24 DIAGNOSIS — T798XXD Other early complications of trauma, subsequent encounter: Secondary | ICD-10-CM

## 2013-04-24 DIAGNOSIS — S91009A Unspecified open wound, unspecified ankle, initial encounter: Secondary | ICD-10-CM

## 2013-04-24 DIAGNOSIS — S81801D Unspecified open wound, right lower leg, subsequent encounter: Secondary | ICD-10-CM

## 2013-04-24 DIAGNOSIS — S81801A Unspecified open wound, right lower leg, initial encounter: Secondary | ICD-10-CM

## 2013-04-24 DIAGNOSIS — Z5189 Encounter for other specified aftercare: Secondary | ICD-10-CM

## 2013-04-24 DIAGNOSIS — L089 Local infection of the skin and subcutaneous tissue, unspecified: Secondary | ICD-10-CM

## 2013-04-24 MED ORDER — DOXYCYCLINE HYCLATE 100 MG PO TABS: 100.0000 mg | ORAL_TABLET | Freq: Two times a day (BID) | ORAL | Status: DC

## 2013-04-24 NOTE — Assessment & Plan Note (Signed)
Extend doxy  Wound clinic pending rto 2 weeks

## 2013-04-24 NOTE — Patient Instructions (Addendum)
Cellulitis Cellulitis is an infection of the skin and the tissue beneath it. The infected area is usually red and tender. Cellulitis occurs most often in the arms and lower legs.  CAUSES  Cellulitis is caused by bacteria that enter the skin through cracks or cuts in the skin. The most common types of bacteria that cause cellulitis are Staphylococcus and Streptococcus. SYMPTOMS   Redness and warmth.  Swelling.  Tenderness or pain.  Fever. DIAGNOSIS  Your caregiver can usually determine what is wrong based on a physical exam. Blood tests may also be done. TREATMENT  Treatment usually involves taking an antibiotic medicine. HOME CARE INSTRUCTIONS   Take your antibiotics as directed. Finish them even if you start to feel better.  Keep the infected arm or leg elevated to reduce swelling.  Apply a warm cloth to the affected area up to 4 times per day to relieve pain.  Only take over-the-counter or prescription medicines for pain, discomfort, or fever as directed by your caregiver.  Keep all follow-up appointments as directed by your caregiver. SEEK MEDICAL CARE IF:   You notice red streaks coming from the infected area.  Your red area gets larger or turns dark in color.  Your bone or joint underneath the infected area becomes painful after the skin has healed.  Your infection returns in the same area or another area.  You notice a swollen bump in the infected area.  You develop new symptoms. SEEK IMMEDIATE MEDICAL CARE IF:   You have a fever.  You feel very sleepy.  You develop vomiting or diarrhea.  You have a general ill feeling (malaise) with muscle aches and pains. MAKE SURE YOU:   Understand these instructions.  Will watch your condition.  Will get help right away if you are not doing well or get worse. Document Released: 07/28/2005 Document Revised: 04/18/2012 Document Reviewed: 01/03/2012 Exit[employer] Patient Information 2014 [employer].  

## 2013-04-24 NOTE — Progress Notes (Signed)
  Subjective:    Patient ID: Andrea Marks, female    DOB: 1951-08-24, 62 y.o.   MRN: [id number]  HPI Pt here to f/u wound R Low leg.  Reddness around wound better.      Review of Systems As above    Objective:   Physical Exam BP 108/55  Pulse 84  Temp(Src) 98.2 F (36.8 C) (Oral)  Wt 296 lb 3.2 oz (134.355 kg)  BMI 54.16 kg/m2  SpO2 97% General appearance: alert, cooperative, appears stated age and no distress Extremities: R low leg--+ wound,  scabbed removed because it was hanging off and sterile dressing is in place        Assessment & Plan:

## 2013-04-25 ENCOUNTER — Other Ambulatory Visit: Payer: Self-pay | Admitting: Family Medicine

## 2013-04-27 LAB — WOUND CULTURE: Gram Stain: NONE SEEN

## 2013-05-06 ENCOUNTER — Encounter: Payer: Self-pay | Admitting: Family Medicine

## 2013-05-08 ENCOUNTER — Encounter (HOSPITAL_BASED_OUTPATIENT_CLINIC_OR_DEPARTMENT_OTHER): Payer: 59 | Attending: Plastic Surgery

## 2013-05-08 DIAGNOSIS — Z90710 Acquired absence of both cervix and uterus: Secondary | ICD-10-CM | POA: Insufficient documentation

## 2013-05-08 DIAGNOSIS — Z9079 Acquired absence of other genital organ(s): Secondary | ICD-10-CM | POA: Insufficient documentation

## 2013-05-08 DIAGNOSIS — K219 Gastro-esophageal reflux disease without esophagitis: Secondary | ICD-10-CM | POA: Insufficient documentation

## 2013-05-08 DIAGNOSIS — E669 Obesity, unspecified: Secondary | ICD-10-CM | POA: Insufficient documentation

## 2013-05-08 DIAGNOSIS — Z7982 Long term (current) use of aspirin: Secondary | ICD-10-CM | POA: Insufficient documentation

## 2013-05-08 DIAGNOSIS — Z79899 Other long term (current) drug therapy: Secondary | ICD-10-CM | POA: Insufficient documentation

## 2013-05-08 DIAGNOSIS — L97809 Non-pressure chronic ulcer of other part of unspecified lower leg with unspecified severity: Secondary | ICD-10-CM | POA: Insufficient documentation

## 2013-05-08 DIAGNOSIS — E785 Hyperlipidemia, unspecified: Secondary | ICD-10-CM | POA: Insufficient documentation

## 2013-05-08 DIAGNOSIS — E1169 Type 2 diabetes mellitus with other specified complication: Secondary | ICD-10-CM | POA: Insufficient documentation

## 2013-05-08 LAB — GLUCOSE, CAPILLARY: Glucose-Capillary: 284 mg/dL — ABNORMAL HIGH (ref 70–99)

## 2013-05-08 NOTE — H&P (Signed)
Andrea Marks, SENSENEY NO.:  [id number]  MEDICAL RECORD NO.:  [id number]  LOCATION:  FOOT                         FACILITY:  [facility]  PHYSICIAN:  Joanne Gavel, M.D.        DATE OF BIRTH:  10/23/1951  DATE OF ADMISSION:  05/08/2013 DATE OF DISCHARGE:                             HISTORY & PHYSICAL   CHIEF COMPLAINT:  Wound, right leg.  HISTORY OF PRESENT ILLNESS:  This is a 62 year old female, who is a long- term diabetic, not insulin dependent.  She cut her right anterior leg on the car door approximately 1 month ago.  This has been treated basically with washings and she has had a course of antibiotics.  PAST MEDICAL HISTORY:  Significant for depression, diabetes, GERD, hyperlipidemia, hypertension and obesity.  PAST SURGICAL HISTORY:  She has had 3 or 4 operations for endometriosis, finally with abdominal hysterectomy and BSO in 1991.  SOCIAL HISTORY:  Cigarettes, none for 15 years.  Alcohol none.  MEDICATIONS:  Tylenol, aspirin, Lotensin, Colace, Vytorin, Diflucan, Prozac, Amaryl, Victoza, Menest, Novolin, fluoxetine, ranitidine, glimepiride.  ALLERGIES:  Penicillin, oxycodone, prednisone.  REVIEW OF SYSTEMS:  As above.  PHYSICAL EXAMINATION:  VITAL SIGNS:  Temperature 98.6, pulse 86, blood pressure 107/73.  Glucose is 284. GENERAL APPEARANCE:  Well developed, somewhat obese, in no distress. EYES, EARS, NOSE, THROAT:  Normal. CHEST:  Clear. HEART:  Regular rhythm. ABDOMEN:  Not examined. EXTREMITIES:  Examination of the right lower extremity reveals good peripheral pulses.  There is bilateral swelling of the calves.  There is a 1.9 x 2.0 x 0.2 punched out defect anteriorly in the right lower extremity.  IMPRESSION:  Trauma, diabetic ulcer Wagner III.  After curettage, the base of the wound is clean.  We will start treating with silver collagen and Profore Lite, and see her in 7 days.     Joanne Gavel, M.D.     RA/MEDQ  D:  05/08/2013  T:   05/08/2013  Job:  [id number]

## 2013-05-10 ENCOUNTER — Other Ambulatory Visit: Payer: Self-pay

## 2013-05-11 ENCOUNTER — Ambulatory Visit: Payer: 59 | Admitting: Internal Medicine

## 2013-05-25 ENCOUNTER — Ambulatory Visit (INDEPENDENT_AMBULATORY_CARE_PROVIDER_SITE_OTHER): Payer: 59 | Admitting: Internal Medicine

## 2013-05-25 ENCOUNTER — Encounter: Payer: Self-pay | Admitting: Internal Medicine

## 2013-05-25 VITALS — BP 98/64 | HR 84 | Temp 98.5°F | Wt 295.0 lb

## 2013-05-25 DIAGNOSIS — E119 Type 2 diabetes mellitus without complications: Secondary | ICD-10-CM

## 2013-05-25 LAB — HEMOGLOBIN A1C: Hgb A1c MFr Bld: 9.1 % — ABNORMAL HIGH (ref 4.6–6.5)

## 2013-05-25 MED ORDER — INSULIN GLARGINE 100 UNIT/ML SOLOSTAR PEN: PEN_INJECTOR | SUBCUTANEOUS | Status: DC

## 2013-05-25 NOTE — Progress Notes (Signed)
Patient ID: Andrea Marks, female   DOB: 30-Sep-1951, 62 y.o.   MRN: [id number]  HPI: Andrea Marks is a 61 y.o.-year-old female, returning for f/u for DM2, dx 1994, non-insulin-dependent, uncontrolled, without complications. Last visit was one and half month ago.  Since I last saw her, she developed an Infection right leg >> wrapped >> will have biological skin (appligraf). Seen at Wound center.  Patient has been diagnosed with diabetes in 1994; she has not been on insulin before. Last hemoglobin A1c was: Lab Results  Component Value Date   HGBA1C 9.3* 01/18/2013   Previously 7.8 in 09/2012. Had a steroid injection in right hand 2.5 mo ago.   Pt is on a regimen of: - Metformin 1500 mg po hs (2x750 mg) - Victoza 1.8 mg daily - Amaryl 4 mg bid   Patient started to check her sugars 1 to 4 times a day, and she brings her sugar log, which we reviewed together: - A.m.: 165-256, most sugars in the low 200s - She has few sugars later in the afternoon, after lunch 370, and before dinner 280 she was shaking more often in the past, and later in the afternoon she was between 171-364 No lows.  She has ? hypoglycemia awareness.   Pt does not have chronic kidney disease, last BUN/creatinine was:  Lab Results  Component Value Date   BUN 14 01/18/2013   CREATININE 0.7 01/18/2013   Last set of lipids: Lab Results  Component Value Date   CHOL 118 01/18/2013   HDL 55.90 01/18/2013   LDLCALC 26 01/18/2013   TRIG 182.0* 01/18/2013   CHOLHDL 2 01/18/2013   Pt's last eye exam was > 1 year (Dr. Nelle Don). No DR. Denies numbness and tingling in her legs. She sees a Risk analyst - Dr. Stacie Acres.   She also has a history of HL, HTN, GERD, depression, h/o thrombophlebitis.  I reviewed pt's medications, allergies, PMH, social hx, family hx and no changes required, except as mentioned above.  ROS: Constitutional: no weight gain/loss, + fatigue, + subjective hyperthermia Eyes: no blurry vision, no  xerophthalmia ENT: no sore throat, no nodules palpated in throat, no dysphagia/odynophagia, no hoarseness Cardiovascular: no CP/SOB/palpitations/+ leg swelling Respiratory: no cough/SOB Gastrointestinal: no N/V/D/C Musculoskeletal: + muscle pain /+ joint aches Skin: + rash legs, easy bruising Neurological: no tremors/numbness/tingling/dizziness  PE: BP 98/64  Pulse 84  Temp(Src) 98.5 F (36.9 C) (Oral)  Wt 295 lb (133.811 kg)  BMI 53.94 kg/m2 Wt Readings from Last 3 Encounters:  05/25/13 295 lb (133.811 kg)  04/24/13 296 lb 3.2 oz (134.355 kg)  04/17/13 298 lb 6.4 oz (135.353 kg)   Constitutional: obesity type 3, in NAD Eyes: PERRLA, EOMI, no exophthalmos ENT: moist mucous membranes, no thyromegaly, no cervical lymphadenopathy Cardiovascular: RRR, No MRG Respiratory: CTA B Gastrointestinal: abdomen soft, NT, ND, BS+ Musculoskeletal: no deformities, strength intact in all 4 Skin: moist, warm, right leg bandaged Neurological: no tremor with outstretched hands, could not elicit DTRs  ASSESSMENT: 1. DM2, non-insulin-dependent, uncontrolled, without complications  PLAN:  1. Patient with long-standing diabetes, recently more uncontrolled, with discomfort for needles, however she appears no more open to.the idea of switching to insulin. She is taking Victoza, with which she does not have a problem. She started to check her sugars at home and brings her sugar log with her, however most of the sugars are checked in the morning. Due to her recent leg infection, the sugars have been high, 200s, and in the higher later in  the day. - for now I advised her to continue her metformin XR 750 mg 3 tablets with dinner, for a total of 2250 mg - continue Victoza 1.8 mg daily - continue Amaryl 4 mg bid - she agrees to try Lantus, and I sent the prescription to her pharmacy for 18 units at night - we discussed about proper insulin administration, rotation of sites, changing needles every time she  injects, keeping the needle in for 10 seconds after last unit of insulin is in - I will see her her back in approximately a month with her sugar log, but I advised her to centimeter sugars are my chart in the interim, so we can adjust the insulin before the next appointment - check HbA1C today  Lab Results  Component Value Date   HGBA1C 9.1* 05/25/2013   Approximately the same as before, will let pt know. Cont plan as above.

## 2013-05-25 NOTE — Patient Instructions (Signed)
Please return in 82month with her sugar log. Please send me sugars to my chart in about 2 weeks to see if we need to adjust the Lantus dose.

## 2013-05-29 ENCOUNTER — Encounter (HOSPITAL_BASED_OUTPATIENT_CLINIC_OR_DEPARTMENT_OTHER): Payer: 59

## 2013-05-30 ENCOUNTER — Other Ambulatory Visit: Payer: Self-pay | Admitting: Family Medicine

## 2013-06-01 ENCOUNTER — Encounter: Payer: Self-pay | Admitting: Internal Medicine

## 2013-06-01 NOTE — Telephone Encounter (Signed)
Resent rx

## 2013-06-05 ENCOUNTER — Other Ambulatory Visit: Payer: Self-pay | Admitting: *Deleted

## 2013-06-05 ENCOUNTER — Encounter (HOSPITAL_BASED_OUTPATIENT_CLINIC_OR_DEPARTMENT_OTHER): Payer: 59 | Attending: General Surgery

## 2013-06-05 DIAGNOSIS — E119 Type 2 diabetes mellitus without complications: Secondary | ICD-10-CM | POA: Insufficient documentation

## 2013-06-05 DIAGNOSIS — I87319 Chronic venous hypertension (idiopathic) with ulcer of unspecified lower extremity: Secondary | ICD-10-CM | POA: Insufficient documentation

## 2013-06-05 DIAGNOSIS — L97909 Non-pressure chronic ulcer of unspecified part of unspecified lower leg with unspecified severity: Secondary | ICD-10-CM | POA: Insufficient documentation

## 2013-06-05 MED ORDER — METFORMIN HCL ER 750 MG PO TB24: 750.0000 mg | ORAL_TABLET | Freq: Every day | ORAL | Status: DC

## 2013-06-12 ENCOUNTER — Encounter: Payer: Self-pay | Admitting: Family Medicine

## 2013-06-13 ENCOUNTER — Encounter: Payer: Self-pay | Admitting: Internal Medicine

## 2013-06-13 ENCOUNTER — Other Ambulatory Visit: Payer: Self-pay | Admitting: Internal Medicine

## 2013-06-13 DIAGNOSIS — E119 Type 2 diabetes mellitus without complications: Secondary | ICD-10-CM

## 2013-06-13 MED ORDER — GLIMEPIRIDE 4 MG PO TABS: ORAL_TABLET | ORAL | Status: DC

## 2013-06-14 ENCOUNTER — Other Ambulatory Visit: Payer: Self-pay | Admitting: *Deleted

## 2013-06-14 MED ORDER — GLIMEPIRIDE 4 MG PO TABS: ORAL_TABLET | ORAL | Status: DC

## 2013-06-14 NOTE — Telephone Encounter (Signed)
Pt sent msg through my chart needs a 10 day supply of glimepiride until her rx comes in from [facility]. Sending rx to her local pharmacy.

## 2013-06-18 LAB — HM DIABETES EYE EXAM: HM Diabetic Eye Exam: NORMAL

## 2013-06-25 ENCOUNTER — Encounter: Payer: Self-pay | Admitting: Internal Medicine

## 2013-06-29 ENCOUNTER — Encounter: Payer: Self-pay | Admitting: Internal Medicine

## 2013-06-29 ENCOUNTER — Ambulatory Visit (INDEPENDENT_AMBULATORY_CARE_PROVIDER_SITE_OTHER): Payer: 59 | Admitting: Internal Medicine

## 2013-06-29 VITALS — BP 120/62 | HR 87 | Temp 98.3°F | Resp 12 | Wt 295.0 lb

## 2013-06-29 DIAGNOSIS — E119 Type 2 diabetes mellitus without complications: Secondary | ICD-10-CM

## 2013-06-29 MED ORDER — INSULIN GLARGINE 100 UNIT/ML SOLOSTAR PEN: PEN_INJECTOR | SUBCUTANEOUS | Status: DC

## 2013-06-29 MED ORDER — GLIMEPIRIDE 4 MG PO TABS: ORAL_TABLET | ORAL | Status: DC

## 2013-06-29 NOTE — Patient Instructions (Signed)
Please increase the Lantus to 25 units in the next 3 days - if sugars in am not <130.

## 2013-06-29 NOTE — Progress Notes (Signed)
Patient ID: Andrea Marks, female   DOB: 08-14-1951, 62 y.o.   MRN: [id number]  HPI: Andrea Marks is a 62 y.o.-year-old female, returning for f/u for DM2, dx 1994, non-insulin-dependent, uncontrolled, without complications. Last visit was a month ago.  She has an ulcer in lower right leg (has lymphedema)>> wrapped >> had a second biological skin (appligraf). [facility]   Patient has been diagnosed with diabetes in 1994; she has not been on insulin before. Last hemoglobin A1c was: Lab Results  Component Value Date   HGBA1C 9.1* 05/25/2013   Previously 7.8 in 09/2012. Had a steroid injection in right hand 2.5 mo ago.   Pt is on a regimen of: - Insulin (Lantus) added at last visit 18 >> she increased to 22 units - Metformin 1500 mg po hs (2x750 mg) - Victoza 1.8 mg daily - Amaryl 4 mg bid   Patient started to check her sugars 1 to 3 times a day, and she brings her sugar log, which we reviewed together: - A.m.: 165-256, most sugars in the low 200s >> 113-180 - pm: 171-364 >> 113-160 Lowest 71 x1, a 75 x 1.   She has ? hypoglycemia awareness.   Pt does not have chronic kidney disease, last BUN/creatinine was:  Lab Results  Component Value Date   BUN 14 01/18/2013   CREATININE 0.7 01/18/2013   Last set of lipids: Lab Results  Component Value Date   CHOL 118 01/18/2013   HDL 55.90 01/18/2013   LDLCALC 26 01/18/2013   TRIG 182.0* 01/18/2013   CHOLHDL 2 01/18/2013   Pt's last eye exam was on 06/18/2013 (Dr. Nelle Don). No DR. Denies numbness and tingling in her legs. She sees a Risk analyst - Dr. Stacie Acres.   She also has a history of HL, HTN, GERD, depression, h/o thrombophlebitis.  I reviewed pt's medications, allergies, PMH, social hx, family hx and no changes required, except as mentioned above.  ROS: Constitutional: no weight gain/loss, no fatigue,no subjective hyperthermia Eyes: no blurry vision, no xerophthalmia ENT: no sore throat, no nodules palpated in throat,  no dysphagia/odynophagia, no hoarseness Cardiovascular: no CP/SOB/palpitations/+ leg swelling Respiratory: no cough/SOB Gastrointestinal: no N/V/D/C Musculoskeletal: no muscle pain / joint aches Skin: + rash R leg, easy bruising Neurological: no tremors/numbness/tingling/dizziness  PE: BP 120/62  Pulse 87  Temp(Src) 98.3 F (36.8 C) (Oral)  Resp 12  Wt 295 lb (133.811 kg)  BMI 53.94 kg/m2  SpO2 98% Wt Readings from Last 3 Encounters:  06/29/13 295 lb (133.811 kg)  05/25/13 295 lb (133.811 kg)  04/24/13 296 lb 3.2 oz (134.355 kg)   Constitutional: obesity type 3, in NAD Eyes: PERRLA, EOMI, no exophthalmos ENT: moist mucous membranes, no thyromegaly, no cervical lymphadenopathy Cardiovascular: RRR, No MRG Respiratory: CTA B Gastrointestinal: abdomen soft, NT, ND, BS+ Musculoskeletal: no deformities, strength intact in all 4 Skin: moist, warm, right leg bandaged Neurological: no tremor with outstretched hands, could not elicit DTRs  ASSESSMENT: 1. DM2, non-insulin-dependent, uncontrolled, without complications  PLAN:  1. Patient with long-standing diabetes, with improved control after starting Lantus. She has no problem with taking Lantus, she was nervous of starting insulin before. She brings her sugar log with her, with most of the sugars  checked in the morning, but has some later in the day, too.  - continue metformin XR 750 mg 3 tablets with dinner, for a total of 2250 mg - continue Victoza 1.8 mg daily - continue Amaryl 4 mg bid - continue Lantus, but increase  to 25 units if am sugars not <130 in the next 3 days - refilled Amaryl and Lantus  - we again discussed about proper insulin administration, rotation of sites, changing needles every time she injects, keeping the needle in for 10 seconds after last unit of insulin is in - I will see her her back in 3 mo

## 2013-07-03 ENCOUNTER — Encounter (HOSPITAL_BASED_OUTPATIENT_CLINIC_OR_DEPARTMENT_OTHER): Payer: 59 | Attending: General Surgery

## 2013-07-03 DIAGNOSIS — L97909 Non-pressure chronic ulcer of unspecified part of unspecified lower leg with unspecified severity: Secondary | ICD-10-CM | POA: Insufficient documentation

## 2013-07-03 DIAGNOSIS — I87319 Chronic venous hypertension (idiopathic) with ulcer of unspecified lower extremity: Secondary | ICD-10-CM | POA: Insufficient documentation

## 2013-07-24 ENCOUNTER — Other Ambulatory Visit: Payer: Self-pay | Admitting: Family Medicine

## 2013-07-30 ENCOUNTER — Telehealth: Payer: Self-pay | Admitting: Internal Medicine

## 2013-07-30 MED ORDER — METFORMIN HCL ER 750 MG PO TB24: 750.0000 mg | ORAL_TABLET | Freq: Three times a day (TID) | ORAL | Status: DC

## 2013-07-30 NOTE — Telephone Encounter (Signed)
Pt called and requested an rx refill for metformin.

## 2013-08-07 ENCOUNTER — Encounter (HOSPITAL_BASED_OUTPATIENT_CLINIC_OR_DEPARTMENT_OTHER): Payer: 59 | Attending: General Surgery

## 2013-08-07 DIAGNOSIS — L97909 Non-pressure chronic ulcer of unspecified part of unspecified lower leg with unspecified severity: Secondary | ICD-10-CM | POA: Insufficient documentation

## 2013-08-07 DIAGNOSIS — I87319 Chronic venous hypertension (idiopathic) with ulcer of unspecified lower extremity: Secondary | ICD-10-CM | POA: Insufficient documentation

## 2013-09-03 ENCOUNTER — Other Ambulatory Visit: Payer: Self-pay | Admitting: *Deleted

## 2013-09-03 MED ORDER — METFORMIN HCL ER 750 MG PO TB24: 750.0000 mg | ORAL_TABLET | Freq: Three times a day (TID) | ORAL | Status: DC

## 2013-09-04 ENCOUNTER — Other Ambulatory Visit: Payer: Self-pay | Admitting: Family Medicine

## 2013-09-04 ENCOUNTER — Encounter (HOSPITAL_BASED_OUTPATIENT_CLINIC_OR_DEPARTMENT_OTHER): Payer: 59 | Attending: General Surgery

## 2013-09-04 DIAGNOSIS — I87319 Chronic venous hypertension (idiopathic) with ulcer of unspecified lower extremity: Secondary | ICD-10-CM | POA: Insufficient documentation

## 2013-09-04 DIAGNOSIS — L97909 Non-pressure chronic ulcer of unspecified part of unspecified lower leg with unspecified severity: Secondary | ICD-10-CM | POA: Insufficient documentation

## 2013-09-05 ENCOUNTER — Other Ambulatory Visit: Payer: Self-pay | Admitting: *Deleted

## 2013-09-05 MED ORDER — METFORMIN HCL ER 750 MG PO TB24: 750.0000 mg | ORAL_TABLET | Freq: Three times a day (TID) | ORAL | Status: DC

## 2013-09-05 NOTE — Telephone Encounter (Signed)
Pt's rx for metformin goes to [facility], qty was wrong. Resending rx refill to [facility].

## 2013-09-06 ENCOUNTER — Other Ambulatory Visit: Payer: Self-pay

## 2013-09-22 ENCOUNTER — Other Ambulatory Visit: Payer: Self-pay | Admitting: Family Medicine

## 2013-09-23 ENCOUNTER — Other Ambulatory Visit: Payer: Self-pay | Admitting: Family Medicine

## 2013-09-24 MED ORDER — LIRAGLUTIDE 18 MG/3ML ~~LOC~~ SOPN: PEN_INJECTOR | SUBCUTANEOUS | Status: DC

## 2013-09-24 NOTE — Addendum Note (Signed)
Addended by: Arnette Norris on: 09/24/2013 11:31 AM   Modules accepted: Orders

## 2013-09-29 ENCOUNTER — Other Ambulatory Visit: Payer: Self-pay | Admitting: Family Medicine

## 2013-10-05 ENCOUNTER — Ambulatory Visit: Payer: 59 | Admitting: Internal Medicine

## 2013-10-11 ENCOUNTER — Ambulatory Visit (INDEPENDENT_AMBULATORY_CARE_PROVIDER_SITE_OTHER): Payer: 59 | Admitting: Internal Medicine

## 2013-10-11 ENCOUNTER — Encounter: Payer: Self-pay | Admitting: Internal Medicine

## 2013-10-11 VITALS — BP 118/62 | HR 89 | Temp 97.6°F | Ht 62.0 in | Wt 288.0 lb

## 2013-10-11 DIAGNOSIS — E119 Type 2 diabetes mellitus without complications: Secondary | ICD-10-CM

## 2013-10-11 LAB — HEMOGLOBIN A1C: Hgb A1c MFr Bld: 6.8 % — ABNORMAL HIGH (ref 4.6–6.5)

## 2013-10-11 NOTE — Progress Notes (Signed)
Patient ID: Andrea Marks, female   DOB: 21-Feb-1951, 62 y.o.   MRN: [id number]  HPI: Andrea Marks is a 62 y.o.-year-old female, returning for f/u for DM2, dx 1994, insulin-dependent, uncontrolled, without complications. Last visit was 2.5 mo ago.  She has an ulcer in lower right leg (has lymphedema)>> wrapped >> had a second biological skin (appligraf) >> now doing better >> cast off >> wearing compression hoses. [facility].   Last hemoglobin A1c was: Lab Results  Component Value Date   HGBA1C 9.1* 05/25/2013   HGBA1C 9.3* 01/18/2013   HGBA1C 7.9* 09/11/2012   Had a steroid injection in right hand 5 mo ago.   Pt is on a regimen of: - Lantus 25 units qhs - Metformin XR 2250 mg po hs (3x750 mg) - Victoza 1.8 mg daily - Amaryl 4 mg bid   Patient checks her sugars 1x a day, and she brings her sugar log, which we reviewed together >> sugars checked almost exclusively in am: - A.m.: 165-256, most sugars in the low 200s >> 113-180 >> 85-170 - pm: 171-364 >> 113-160 >>n/c Lowest 71 x1, a 75 x 1.   She has ? hypoglycemia awareness.   Pt does not have chronic kidney disease, last BUN/creatinine was:  Lab Results  Component Value Date   BUN 14 01/18/2013   CREATININE 0.7 01/18/2013  She is on Benazepril. Last set of lipids: Lab Results  Component Value Date   CHOL 118 01/18/2013   HDL 55.90 01/18/2013   LDLCALC 26 01/18/2013   TRIG 182.0* 01/18/2013   CHOLHDL 2 01/18/2013  On Vytorin. Pt's last eye exam was on 06/18/2013 (Dr. Nelle Don). No DR. Denies numbness and tingling in her legs. She sees a Risk analyst - Dr. Stacie Acres.   She also has a history of HL, HTN, GERD, depression, h/o thrombophlebitis.  I reviewed pt's medications, allergies, PMH, social hx, family hx and no changes required, except as mentioned above.  ROS: Constitutional: no weight gain/loss, no fatigue, no subjective hyperthermia Eyes: no blurry vision, no xerophthalmia ENT: no sore throat, no nodules  palpated in throat, no dysphagia/odynophagia, no hoarseness Cardiovascular: no CP/SOB/palpitations/+ leg swelling (lymphedema) Respiratory: no cough/SOB Gastrointestinal: no N/V/D/C Musculoskeletal: no muscle pain / joint aches Skin: + rash R leg (stasis dermatitis) - ulcer (seen at wound care) , easy bruising Neurological: no tremors/numbness/tingling/dizziness  PE: BP 118/62  Pulse 89  Temp(Src) 97.6 F (36.4 C) (Oral)  Ht 5\' 2"  (1.575 m)  Wt 288 lb (130.636 kg)  BMI 52.66 kg/m2  SpO2 93% Wt Readings from Last 3 Encounters:  10/11/13 288 lb (130.636 kg)  06/29/13 295 lb (133.811 kg)  05/25/13 295 lb (133.811 kg)   Constitutional: obesity type 3, in NAD Eyes: PERRLA, EOMI, no exophthalmos ENT: moist mucous membranes, no thyromegaly, no cervical lymphadenopathy Cardiovascular: RRR, No MRG Respiratory: CTA B Gastrointestinal: abdomen soft, NT, ND, BS+ Musculoskeletal: no deformities, strength intact in all 4 Skin: moist, warm, skin ulcer R leg improving - has compression hoses on   ASSESSMENT: 1. DM2, non-insulin-dependent, uncontrolled, without complications  PLAN:  1. Patient with long-standing diabetes, with improved control after starting Lantus. She has no problem with taking Lantus, she was nervous of starting insulin before. She brings her sugar log with her, with most of the sugars  checked in the morning, very few, if any, later in the day (higher: 180-200) >> strongly advised her to start rotating check times and RTC in 1 mo with her log.  -  continue metformin XR 750 mg 3 tablets with dinner, for a total of 2250 mg - continue Victoza 1.8 mg daily - continue Amaryl 4 mg bid - continue Lantus 25 units  - I would like to review her log in 1 mo (hopefully with sugars later in the day) and see if we need to add, for e.g. Invokana). I did discuss with her about it.  - no refills needed - given new log - check HbA1c today - we again discussed about proper insulin  administration, rotation of sites, changing needles every time she injects, keeping the needle in for 10 seconds after last unit of insulin is in - I will see her her back in 1 mo  Office Visit on 10/11/2013  Component Date Value Range Status  . Hemoglobin A1C 10/11/2013 6.8* 4.6 - 6.5 % Final   Glycemic Control Guidelines for People with Diabetes:Non Diabetic:  <6%Goal of Therapy: <7%Additional Action Suggested:  >8%    Mag sent. Can follow up in 3 instead of 1 mo.

## 2013-10-11 NOTE — Patient Instructions (Signed)
Please continue the same diabetic regimen. Start checking sugars later in the day, too, as shown. Please return in 1 month with your sugar log.  Pleas stop at the lab.  HAPPY HOLIDAYS!

## 2013-11-12 ENCOUNTER — Ambulatory Visit: Payer: 59 | Admitting: Internal Medicine

## 2013-11-18 ENCOUNTER — Other Ambulatory Visit: Payer: Self-pay | Admitting: Family Medicine

## 2013-12-01 ENCOUNTER — Other Ambulatory Visit: Payer: Self-pay | Admitting: Family Medicine

## 2013-12-03 ENCOUNTER — Other Ambulatory Visit: Payer: Self-pay | Admitting: Internal Medicine

## 2013-12-03 NOTE — Telephone Encounter (Signed)
Refill request for fluoxetine refused because patient needs OV. Called and left message for patient to call and make an appointment. JG//CMA

## 2013-12-15 ENCOUNTER — Other Ambulatory Visit: Payer: Self-pay | Admitting: Family Medicine

## 2013-12-22 ENCOUNTER — Other Ambulatory Visit: Payer: Self-pay | Admitting: Family Medicine

## 2013-12-27 ENCOUNTER — Ambulatory Visit: Payer: 59 | Admitting: Family Medicine

## 2013-12-28 ENCOUNTER — Other Ambulatory Visit: Payer: Self-pay | Admitting: Family Medicine

## 2014-01-09 ENCOUNTER — Encounter: Payer: Self-pay | Admitting: Family Medicine

## 2014-01-10 ENCOUNTER — Other Ambulatory Visit: Payer: Self-pay | Admitting: Family Medicine

## 2014-01-10 DIAGNOSIS — I10 Essential (primary) hypertension: Secondary | ICD-10-CM

## 2014-01-10 DIAGNOSIS — E785 Hyperlipidemia, unspecified: Secondary | ICD-10-CM

## 2014-01-11 ENCOUNTER — Encounter: Payer: Self-pay | Admitting: Internal Medicine

## 2014-01-11 ENCOUNTER — Telehealth: Payer: Self-pay | Admitting: Family Medicine

## 2014-01-11 ENCOUNTER — Ambulatory Visit (INDEPENDENT_AMBULATORY_CARE_PROVIDER_SITE_OTHER): Payer: 59 | Admitting: Internal Medicine

## 2014-01-11 VITALS — BP 128/68 | HR 89 | Temp 98.7°F | Resp 12 | Wt 292.0 lb

## 2014-01-11 DIAGNOSIS — I10 Essential (primary) hypertension: Secondary | ICD-10-CM

## 2014-01-11 DIAGNOSIS — E785 Hyperlipidemia, unspecified: Secondary | ICD-10-CM

## 2014-01-11 DIAGNOSIS — E119 Type 2 diabetes mellitus without complications: Secondary | ICD-10-CM

## 2014-01-11 LAB — LIPID PANEL
Cholesterol: 114 mg/dL (ref 0–200)
HDL: 61.9 mg/dL (ref >39.00)
LDL Cholesterol: 31 mg/dL (ref 0–99)
Total CHOL/HDL Ratio: 2
Triglycerides: 104 mg/dL (ref 0.0–149.0)
VLDL: 20.8 mg/dL (ref 0.0–40.0)

## 2014-01-11 LAB — HEPATIC FUNCTION PANEL
ALT: 14 U/L (ref 0–35)
AST: 17 U/L (ref 0–37)
Albumin: 3.2 g/dL — ABNORMAL LOW (ref 3.5–5.2)
Alkaline Phosphatase: 39 U/L (ref 39–117)
Bilirubin, Direct: 0 mg/dL (ref 0.0–0.3)
Total Bilirubin: 0.3 mg/dL (ref 0.3–1.2)
Total Protein: 6.4 g/dL (ref 6.0–8.3)

## 2014-01-11 LAB — BASIC METABOLIC PANEL
BUN: 16 mg/dL (ref 6–23)
CO2: 25 mEq/L (ref 19–32)
Calcium: 8.7 mg/dL (ref 8.4–10.5)
Chloride: 102 mEq/L (ref 96–112)
Creatinine, Ser: 0.7 mg/dL (ref 0.4–1.2)
GFR: 94.52 mL/min (ref >60.00)
Glucose, Bld: 237 mg/dL — ABNORMAL HIGH (ref 70–99)
Potassium: 4.1 mEq/L (ref 3.5–5.1)
Sodium: 136 mEq/L (ref 135–145)

## 2014-01-11 LAB — HEMOGLOBIN A1C: Hgb A1c MFr Bld: 7.3 % — ABNORMAL HIGH (ref 4.6–6.5)

## 2014-01-11 NOTE — Progress Notes (Signed)
Patient ID: Andrea Marks, female   DOB: 16-Nov-1950, 63 y.o.   MRN: [id number]  HPI: Andrea Marks is a 63 y.o.-year-old female, returning for f/u for DM2, dx 1994, insulin-dependent, uncontrolled, without complications. Last visit was 3 mo ago.  Last hemoglobin A1c was: Lab Results  Component Value Date   HGBA1C 6.8* 10/11/2013   HGBA1C 9.1* 05/25/2013   HGBA1C 9.3* 01/18/2013   Pt is on a regimen of: - Lantus 25 units qhs - Metformin XR 2250 mg po hs (3x750 mg) - Victoza 1.8 mg daily - Amaryl 4 mg bid   Patient did not check sugars since last visit, as she had a period of high stress with her mom being dx with Lewy body dementia and her friend with cancer. She did take all her meds, but she did not check sugars and she relaxed her diet since last visit. Before last visit, she was checking 1x a day (no log today) >> sugars checked almost exclusively in am: - A.m.: 165-256, most sugars in the low 200s >> 113-180 >> 85-170 >> 180 this am, no other checks - pm: 171-364 >> 113-160 >> n/c ? Lows.  She has ? hypoglycemia awareness.   Pt does not have chronic kidney disease, last BUN/creatinine was:  Lab Results  Component Value Date   BUN 14 01/18/2013   CREATININE 0.7 01/18/2013  She is on Benazepril. Last set of lipids: Lab Results  Component Value Date   CHOL 118 01/18/2013   HDL 55.90 01/18/2013   LDLCALC 26 01/18/2013   TRIG 182.0* 01/18/2013   CHOLHDL 2 01/18/2013  On Vytorin. Pt's last eye exam was on 06/18/2013 (Dr. Nelle Don). No DR.  Denies numbness and tingling in her legs. She sees a Risk analyst - Dr. Stacie Acres.  She had an ulcer in lower right leg (has lymphedema)>> wrapped >> had a second biological skin (appligraf) >> now doing better.   She also has a history of HL, HTN, GERD, depression, h/o thrombophlebitis.  I reviewed pt's medications, allergies, PMH, social hx, family hx and no changes required, except as mentioned above.  ROS: Constitutional: + weight gain,  + fatigue, no subjective hyperthermia, + poor sleep Eyes: no blurry vision, no xerophthalmia ENT: no sore throat, no nodules palpated in throat, no dysphagia/odynophagia, no hoarseness Cardiovascular: no CP/SOB/palpitations/+ leg swelling (lymphedema) Respiratory: no cough/SOB Gastrointestinal: no N/V/+ D/+ C/+ heartburn Musculoskeletal: no muscle pain / + joint aches Skin: + rash R leg (stasis dermatitis) , easy bruising Neurological: no tremors/numbness/tingling/dizziness  PE: BP 128/68  Pulse 89  Temp(Src) 98.7 F (37.1 C) (Oral)  Resp 12  Wt 292 lb (132.45 kg)  SpO2 95% Wt Readings from Last 3 Encounters:  01/11/14 292 lb (132.45 kg)  10/11/13 288 lb (130.636 kg)  06/29/13 295 lb (133.811 kg)   Constitutional: obesity type 3, in NAD Eyes: PERRLA, EOMI, no exophthalmos ENT: moist mucous membranes, no thyromegaly, no cervical lymphadenopathy Cardiovascular: RRR, No MRG Respiratory: CTA B Gastrointestinal: abdomen soft, NT, ND, BS+ Musculoskeletal: no deformities, strength intact in all 4 Skin: moist, warm, compression hoses on   ASSESSMENT: 1. DM2, non-insulin-dependent, uncontrolled, without complications  PLAN:  1. Patient with long-standing diabetes, with improved control after starting Lantus, but she stopped checking her sugars and she relaxed her diet after last visit >> unclear control now. -  strongly advised her to start rotating check times and RTC in 1 mo with her log.  - continue metformin XR 750 mg 3 tablets with dinner, for a  total of 2250 mg - continue Victoza 1.8 mg daily - continue Amaryl 4 mg bid - continue Lantus 25 units  - I would like to review her log in 1 mo and see if we need to add, for e.g. Invokana). - no refills needed - check HbA1c today  Office Visit on 01/11/2014  Component Date Value Ref Range Status  . Hemoglobin A1C 01/11/2014 7.3* 4.6 - 6.5 % Final   Glycemic Control Guidelines for People with Diabetes:Non Diabetic:  <6%Goal of  Therapy: <7%Additional Action Suggested:  >8%    HbA1c increased, as expected.

## 2014-01-11 NOTE — Telephone Encounter (Signed)
Relevant patient education assigned to patient using Emmi. ° °

## 2014-01-11 NOTE — Patient Instructions (Signed)
Please continue the current regimen.  Please stop at the lab.  Please return in 1 month with your sugar log.

## 2014-02-07 ENCOUNTER — Other Ambulatory Visit: Payer: Self-pay

## 2014-02-14 ENCOUNTER — Ambulatory Visit: Payer: 59 | Admitting: Internal Medicine

## 2014-02-16 ENCOUNTER — Other Ambulatory Visit: Payer: Self-pay | Admitting: Family Medicine

## 2014-03-20 ENCOUNTER — Other Ambulatory Visit: Payer: Self-pay | Admitting: Internal Medicine

## 2014-03-22 ENCOUNTER — Other Ambulatory Visit: Payer: Self-pay | Admitting: Family Medicine

## 2014-03-28 ENCOUNTER — Other Ambulatory Visit: Payer: Self-pay | Admitting: Family Medicine

## 2014-04-16 ENCOUNTER — Ambulatory Visit: Payer: 59 | Admitting: Internal Medicine

## 2014-05-10 ENCOUNTER — Other Ambulatory Visit: Payer: Self-pay | Admitting: Family Medicine

## 2014-05-10 ENCOUNTER — Other Ambulatory Visit: Payer: Self-pay | Admitting: Internal Medicine

## 2014-05-10 NOTE — Telephone Encounter (Signed)
Med filled.  

## 2014-05-23 ENCOUNTER — Other Ambulatory Visit: Payer: Self-pay | Admitting: Family Medicine

## 2014-05-23 NOTE — Telephone Encounter (Signed)
Refill Request:  ranitidine (ZANTAC) 300 MG capsule--TAKE 1 CAPSULE DAILY  Last Filled: 12/17/13 Amt Filled: 90 cap, 1 refill  Last OV:  04/24/13  Medication refilled x 1 month.  Follow up appointment scheduled for 06/03/14 @ 10:15 am.   ___________________________________________________________________________  Pt also had some concerns regarding her Victoza.  She was told by her insurance company that they would no longer cover this medication.  States she only has two more doses left.  Pt was encouraged to call her insurance company to see what comparable medication they would be able to cover and then call us back as soon as possible.  Pt stated understanding and agreed.

## 2014-05-30 ENCOUNTER — Other Ambulatory Visit: Payer: Self-pay | Admitting: Family Medicine

## 2014-06-03 ENCOUNTER — Ambulatory Visit: Payer: 59 | Admitting: Family Medicine

## 2014-06-03 DIAGNOSIS — Z0289 Encounter for other administrative examinations: Secondary | ICD-10-CM

## 2014-06-05 ENCOUNTER — Other Ambulatory Visit: Payer: Self-pay | Admitting: Family Medicine

## 2014-06-23 ENCOUNTER — Other Ambulatory Visit: Payer: Self-pay | Admitting: Internal Medicine

## 2014-07-12 ENCOUNTER — Encounter: Payer: Self-pay | Admitting: Family Medicine

## 2014-07-12 ENCOUNTER — Ambulatory Visit (INDEPENDENT_AMBULATORY_CARE_PROVIDER_SITE_OTHER): Payer: 59 | Admitting: Family Medicine

## 2014-07-12 VITALS — BP 137/80 | HR 91 | Temp 99.1°F | Wt 302.2 lb

## 2014-07-12 DIAGNOSIS — I10 Essential (primary) hypertension: Secondary | ICD-10-CM

## 2014-07-12 DIAGNOSIS — E785 Hyperlipidemia, unspecified: Secondary | ICD-10-CM

## 2014-07-12 DIAGNOSIS — E1159 Type 2 diabetes mellitus with other circulatory complications: Secondary | ICD-10-CM

## 2014-07-12 LAB — CBC WITH DIFFERENTIAL/PLATELET
Basophils Absolute: 0 10*3/uL (ref 0.0–0.1)
Basophils Relative: 0 % (ref 0–1)
Eosinophils Absolute: 0.2 10*3/uL (ref 0.0–0.7)
Eosinophils Relative: 2 % (ref 0–5)
HCT: 42.4 % (ref 36.0–46.0)
Hemoglobin: 13.9 g/dL (ref 12.0–15.0)
Lymphocytes Relative: 19 % (ref 12–46)
Lymphs Abs: 1.7 10*3/uL (ref 0.7–4.0)
MCH: 27.6 pg (ref 26.0–34.0)
MCHC: 32.8 g/dL (ref 30.0–36.0)
MCV: 84.3 fL (ref 78.0–100.0)
Monocytes Absolute: 0.6 10*3/uL (ref 0.1–1.0)
Monocytes Relative: 6 % (ref 3–12)
Neutro Abs: 6.7 10*3/uL (ref 1.7–7.7)
Neutrophils Relative %: 73 % (ref 43–77)
Platelets: 205 10*3/uL (ref 150–400)
RBC: 5.03 MIL/uL (ref 3.87–5.11)
RDW: 14.1 % (ref 11.5–15.5)
WBC: 9.2 10*3/uL (ref 4.0–10.5)

## 2014-07-12 LAB — POCT URINALYSIS DIPSTICK
Bilirubin, UA: NEGATIVE
Blood, UA: NEGATIVE
Leukocytes, UA: NEGATIVE
Nitrite, UA: NEGATIVE
Protein, UA: NEGATIVE
Spec Grav, UA: >=1.03
Urobilinogen, UA: 0.2
pH, UA: 5

## 2014-07-12 LAB — BASIC METABOLIC PANEL
BUN: 19 mg/dL (ref 6–23)
CO2: 28 mEq/L (ref 19–32)
Calcium: 9.3 mg/dL (ref 8.4–10.5)
Chloride: 99 mEq/L (ref 96–112)
Creat: 0.89 mg/dL (ref 0.50–1.10)
Glucose, Bld: 339 mg/dL — ABNORMAL HIGH (ref 70–99)
Potassium: 4.9 mEq/L (ref 3.5–5.3)
Sodium: 138 mEq/L (ref 135–145)

## 2014-07-12 LAB — HEPATIC FUNCTION PANEL
ALT: 16 U/L (ref 0–35)
AST: 21 U/L (ref 0–37)
Albumin: 3.8 g/dL (ref 3.5–5.2)
Alkaline Phosphatase: 51 U/L (ref 39–117)
Bilirubin, Direct: 0.1 mg/dL (ref 0.0–0.3)
Indirect Bilirubin: 0.4 mg/dL (ref 0.2–1.2)
Total Bilirubin: 0.5 mg/dL (ref 0.2–1.2)
Total Protein: 6.2 g/dL (ref 6.0–8.3)

## 2014-07-12 LAB — LIPID PANEL
Cholesterol: 106 mg/dL (ref 0–200)
HDL: 58 mg/dL (ref >39)
LDL Cholesterol: 13 mg/dL (ref 0–99)
Total CHOL/HDL Ratio: 1.8 Ratio
Triglycerides: 176 mg/dL — ABNORMAL HIGH (ref <150)
VLDL: 35 mg/dL (ref 0–40)

## 2014-07-12 LAB — HEMOGLOBIN A1C
Hgb A1c MFr Bld: 10.1 % — ABNORMAL HIGH (ref <5.7)
Mean Plasma Glucose: 243 mg/dL — ABNORMAL HIGH (ref <117)

## 2014-07-12 MED ORDER — EXENATIDE ER 2 MG ~~LOC~~ PEN: PEN_INJECTOR | SUBCUTANEOUS | Status: DC

## 2014-07-12 MED ORDER — EZETIMIBE-SIMVASTATIN 10-40 MG PO TABS: 1.0000 | ORAL_TABLET | Freq: Every day | ORAL | Status: DC

## 2014-07-12 NOTE — Progress Notes (Signed)
Pre visit review using our clinic review tool, if applicable. No additional management support is needed unless otherwise documented below in the visit note. 

## 2014-07-12 NOTE — Patient Instructions (Signed)
Diabetes and Standards of Medical Care Diabetes is complicated. You may find that your diabetes team includes a dietitian, nurse, diabetes educator, eye doctor, and more. To help everyone know what is going on and to help you get the care you deserve, the following schedule of care was developed to help keep you on track. Below are the tests, exams, vaccines, medicines, education, and plans you will need. HbA1c test This test shows how well you have controlled your glucose over the past 2-3 months. It is used to see if your diabetes management plan needs to be adjusted.   It is performed at least 2 times a year if you are meeting treatment goals.  It is performed 4 times a year if therapy has changed or if you are not meeting treatment goals. Blood pressure test  This test is performed at every routine medical visit. The goal is less than 140/90 mm Hg for most people, but 130/80 mm Hg in some cases. Ask your health care provider about your goal. Dental exam  Follow up with the dentist regularly. Eye exam  If you are diagnosed with type 1 diabetes as a child, get an exam upon reaching the age of 37 years or older and have had diabetes for 3-5 years. Yearly eye exams are recommended after that initial eye exam.  If you are diagnosed with type 1 diabetes as an adult, get an exam within 5 years of diagnosis and then yearly.  If you are diagnosed with type 2 diabetes, get an exam as soon as possible after the diagnosis and then yearly. Foot care exam  Visual foot exams are performed at every routine medical visit. The exams check for cuts, injuries, or other problems with the feet.  A comprehensive foot exam should be done yearly. This includes visual inspection as well as assessing foot pulses and testing for loss of sensation.  Check your feet nightly for cuts, injuries, or other problems with your feet. Tell your health care provider if anything is not healing. Kidney function test (urine  microalbumin)  This test is performed once a year.  Type 1 diabetes: The first test is performed 5 years after diagnosis.  Type 2 diabetes: The first test is performed at the time of diagnosis.  A serum creatinine and estimated glomerular filtration rate (eGFR) test is done once a year to assess the level of chronic kidney disease (CKD), if present. Lipid profile (cholesterol, HDL, LDL, triglycerides)  Performed every 5 years for most people.  The goal for LDL is less than 100 mg/dL. If you are at high risk, the goal is less than 70 mg/dL.  The goal for HDL is 40 mg/dL-50 mg/dL for men and 50 mg/dL-60 mg/dL for women. An HDL cholesterol of 60 mg/dL or higher gives some protection against heart disease.  The goal for triglycerides is less than 150 mg/dL. Influenza vaccine, pneumococcal vaccine, and hepatitis B vaccine  The influenza vaccine is recommended yearly.  It is recommended that people with diabetes who are over 24 years old get the pneumonia vaccine. In some cases, two separate shots may be given. Ask your health care provider if your pneumonia vaccination is up to date.  The hepatitis B vaccine is also recommended for adults with diabetes. Diabetes self-management education  Education is recommended at diagnosis and ongoing as needed. Treatment plan  Your treatment plan is reviewed at every medical visit. Document Released: 08/15/2009 Document Revised: 03/04/2014 Document Reviewed: 03/20/2013 [facility] Patient Information 2015 [place],  [employer]. This information is not intended to replace advice given to you by your health care provider. Make sure you discuss any questions you have with your health care provider.  

## 2014-07-12 NOTE — Progress Notes (Signed)
Subjective:    Patient ID: Andrea Marks, female    DOB: Jun 10, 1951, 63 y.o.   MRN: [id number]  HPI  HPI HYPERTENSION  Blood pressure range-good  Chest pain- no      Dyspnea- no Lightheadedness- no   Edema- no change Other side effects - no   Medication compliance: poor Low salt diet-   DIABETES  Blood Sugar ranges-not checking  Polyuria- no New Visual problems- no Hypoglycemic symptoms- no Other side effects-no Medication compliance - poor Last eye exam- due Foot exam- today  HYPERLIPIDEMIA  Medication compliance- poor RUQ pain- no  Muscle aches- no Other side effects-no  .      Review of Systems As above  Past Medical History  Diagnosis Date  . Depression   . Diabetes mellitus   . GERD (gastroesophageal reflux disease)   . Hyperlipidemia   . Hypertension   . Obesity    History   Social History  . Marital Status: Married    Spouse Name: N/A    Number of Children: 0  . Years of Education: 13   Occupational History  . retired from [employer]    Social History Main Topics  . Smoking status: Former Research scientist (life sciences)  . Smokeless tobacco: Never Used  . Alcohol Use: No  . Drug Use: No  . Sexual Activity: Yes    Partners: Male   Other Topics Concern  . Not on file   Social History Narrative   No reg exercise   Current Outpatient Prescriptions  Medication Sig Dispense Refill  . acetaminophen (TYLENOL ARTHRITIS PAIN) 650 MG CR tablet Take 650 mg by mouth 2 (two) times daily as needed. For pain      . aspirin 81 MG tablet Take 81 mg by mouth daily.        . benazepril (LOTENSIN) 20 MG tablet TAKE 1 TABLET DAILY (OFFICE VISIT DUE NOW)  90 tablet  0  . Blood Glucose Monitoring Suppl (ONE TOUCH ULTRA SYSTEM KIT) W/DEVICE KIT 1 kit by Does not apply route once.  1 each  0  . docusate sodium (COLACE) 100 MG capsule Take 100 mg by mouth every other day.        Marland Kitchen FLUoxetine (PROZAC) 20 MG capsule Take 3 capsules daily--office visit due in December  270  capsule  0  . glimepiride (AMARYL) 4 MG tablet TAKE 1 TABLET TWICE A DAY  180 tablet  2  . LANTUS SOLOSTAR 100 UNIT/ML Solostar Pen INJECT UNDER THE SKIN 25 UNITS OF LANTUS AT BEDTIME  15 mL  11  . MENEST 2.5 MG TABS TAKE 1 TABLET EVERY OTHER DAY (OFFICE VISIT DUE NOW)  45 tablet  0  . metFORMIN (GLUCOPHAGE-XR) 750 MG 24 hr tablet TAKE 1 TABLET THREE TIMES A DAY  270 tablet  3  . NOVOFINE 32G X 6 MM MISC USE 1 NEEDLE UNDER THE SKIN DAILY  100 each  1  . ONE TOUCH ULTRA TEST test strip       . ONETOUCH DELICA LANCETS FINE MISC USE AS INSTRUCTED  1 each  9  . ranitidine (ZANTAC) 300 MG capsule TAKE 1 CAPSULE DAILY  30 capsule  0  . VYTORIN 10-40 MG per tablet TAKE 1 TABLET AT BEDTIME (REPEAT LABS ARE DUE NOW)  90 tablet  0   No current facility-administered medications for this visit.        Objective:   Physical Exam  BP 137/80  Pulse 91  Temp(Src) 99.1 F (  37.3 C) (Oral)  Wt 302 lb 3.2 oz (137.077 kg)  SpO2 94% General appearance: alert, cooperative, appears stated age and no distress Throat: lips, mucosa, and tongue normal; teeth and gums normal Lungs: clear to auscultation bilaterally Heart: S1, S2 normal Extremities: extremities normal, atraumatic, no cyanosis or edema Sensory exam of the foot is normal, tested with the monofilament. Good pulses, no lesions or ulcers, good peripheral pulses.        Assessment & Plan:  1. Type II or unspecified type diabetes mellitus with peripheral circulatory disorders, uncontrolled(250.72) Check labs-- f/u endo - Hemoglobin A1c - Microalbumin / creatinine urine ratio - POCT urinalysis dipstick - Basic metabolic panel - CBC with Differential - Hemoglobin A1c - Hepatic function panel - Lipid panel - Microalbumin / creatinine urine ratio - POCT urinalysis dipstick - Exenatide ER (BYDUREON) 2 MG PEN; 2 mg [place] 1x weekly  Dispense: 1 each; Refill: 5  2. Essential hypertension Stable , check labs - Basic metabolic panel - CBC with  Differential - Basic metabolic panel - CBC with Differential - Hemoglobin A1c - Hepatic function panel - Lipid panel - Microalbumin / creatinine urine ratio - POCT urinalysis dipstick  3. Other and unspecified hyperlipidemia Check labs- - Lipid panel - Hepatic function panel - Basic metabolic panel - CBC with Differential - Hemoglobin A1c - Hepatic function panel - Lipid panel - Microalbumin / creatinine urine ratio - POCT urinalysis dipstick

## 2014-07-13 LAB — MICROALBUMIN / CREATININE URINE RATIO
Creatinine, Urine: 106.2 mg/dL
Microalb Creat Ratio: 5.4 mg/g (ref 0.0–30.0)
Microalb, Ur: 0.57 mg/dL (ref 0.00–1.89)

## 2014-07-25 ENCOUNTER — Other Ambulatory Visit: Payer: Self-pay | Admitting: Family Medicine

## 2014-07-27 ENCOUNTER — Encounter: Payer: Self-pay | Admitting: Family Medicine

## 2014-07-27 ENCOUNTER — Other Ambulatory Visit: Payer: Self-pay | Admitting: Family Medicine

## 2014-07-29 MED ORDER — FLUOXETINE HCL 20 MG PO CAPS: ORAL_CAPSULE | ORAL | Status: DC

## 2014-08-22 ENCOUNTER — Ambulatory Visit: Payer: 59 | Admitting: Internal Medicine

## 2014-08-28 ENCOUNTER — Other Ambulatory Visit: Payer: Self-pay | Admitting: Family Medicine

## 2014-09-02 ENCOUNTER — Other Ambulatory Visit: Payer: Self-pay

## 2014-09-02 MED ORDER — EZETIMIBE-SIMVASTATIN 10-40 MG PO TABS: 1.0000 | ORAL_TABLET | Freq: Every day | ORAL | Status: DC

## 2014-09-25 LAB — HM DIABETES EYE EXAM

## 2014-10-11 ENCOUNTER — Ambulatory Visit: Payer: 59 | Admitting: Family Medicine

## 2014-10-28 ENCOUNTER — Ambulatory Visit (INDEPENDENT_AMBULATORY_CARE_PROVIDER_SITE_OTHER): Payer: 59 | Admitting: Family Medicine

## 2014-10-28 ENCOUNTER — Encounter: Payer: Self-pay | Admitting: Family Medicine

## 2014-10-28 VITALS — BP 138/84 | HR 113 | Temp 98.4°F | Resp 24 | Wt 302.4 lb

## 2014-10-28 DIAGNOSIS — M25561 Pain in right knee: Secondary | ICD-10-CM

## 2014-10-28 DIAGNOSIS — J208 Acute bronchitis due to other specified organisms: Secondary | ICD-10-CM

## 2014-10-28 MED ORDER — AZITHROMYCIN 250 MG PO TABS: ORAL_TABLET | ORAL | Status: DC

## 2014-10-28 MED ORDER — BENZONATATE 100 MG PO CAPS: 100.0000 mg | ORAL_CAPSULE | Freq: Three times a day (TID) | ORAL | Status: DC | PRN

## 2014-10-28 NOTE — Patient Instructions (Signed)

## 2014-10-28 NOTE — Progress Notes (Signed)
Pre visit review using our clinic review tool, if applicable. No additional management support is needed unless otherwise documented below in the visit note. 

## 2014-10-28 NOTE — Progress Notes (Signed)
  Subjective:     Andrea Marks is a 63 y.o. female who presents for evaluation of sinus pain. Symptoms include: congestion, cough, facial pain, frequent clearing of the throat, headaches, nasal congestion, post nasal drip and sinus pressure. Onset of symptoms was 2 weeks ago. Symptoms have been gradually worsening since that time. Past history is significant for no history of pneumonia or bronchitis. Patient is a non-smoker.  Pt twisted her r knee in November when [place] beach. Her leg got twisted in the sheets.  She had trouble with pain with walking and even at rest since.    The following portions of the patient's history were reviewed and updated as appropriate: allergies, current medications, past family history, past medical history, past social history, past surgical history and problem list.  Review of Systems Pertinent items are noted in HPI.   Objective:    BP 138/84 mmHg  Pulse 113  Temp(Src) 98.4 F (36.9 C) (Oral)  Resp 24  Wt 302 lb 6.4 oz (137.168 kg)  SpO2 93% General appearance: alert, cooperative, appears stated age and no distress Ears: normal TM&#39;s and external ear canals both ears Nose: Nares normal. Septum midline. Mucosa normal. No drainage or sinus tenderness., green discharge, moderate congestion, turbinates red, swollen, sinus tenderness bilateral Throat: lips, mucosa, and tongue normal; teeth and gums normal Neck: mild anterior cervical adenopathy, supple, symmetrical, trachea midline and thyroid not enlarged, symmetric, no tenderness/mass/nodules Lungs: clear to auscultation bilaterally Heart: S1, S2 normal Extremities: extremities normal, atraumatic, no cyanosis or edema Lymph nodes: Cervical, supraclavicular, and axillary nodes normal.    Assessment:    Acute bacterial sinusitis.    Plan:    Nasal steroids per medication orders. Antihistamines per medication orders. Zithromax per medication orders.   Tessalon perles rto  prn

## 2014-11-04 ENCOUNTER — Encounter: Payer: Self-pay | Admitting: Family Medicine

## 2014-11-05 ENCOUNTER — Other Ambulatory Visit: Payer: Self-pay | Admitting: Internal Medicine

## 2014-11-05 ENCOUNTER — Other Ambulatory Visit: Payer: Self-pay | Admitting: *Deleted

## 2014-11-05 MED ORDER — METFORMIN HCL ER 750 MG PO TB24: 750.0000 mg | ORAL_TABLET | Freq: Three times a day (TID) | ORAL | Status: DC

## 2014-11-05 NOTE — Telephone Encounter (Signed)
Resend per Dr Elvera LennoxGherghe.

## 2014-12-15 ENCOUNTER — Other Ambulatory Visit: Payer: Self-pay | Admitting: Family Medicine

## 2015-01-12 ENCOUNTER — Other Ambulatory Visit: Payer: Self-pay | Admitting: Family Medicine

## 2015-02-01 ENCOUNTER — Other Ambulatory Visit: Payer: Self-pay | Admitting: Family Medicine

## 2015-02-05 ENCOUNTER — Other Ambulatory Visit: Payer: Self-pay | Admitting: Family Medicine

## 2015-02-08 ENCOUNTER — Other Ambulatory Visit: Payer: Self-pay | Admitting: Family Medicine

## 2015-02-25 ENCOUNTER — Other Ambulatory Visit: Payer: Self-pay | Admitting: *Deleted

## 2015-02-25 ENCOUNTER — Other Ambulatory Visit: Payer: Self-pay | Admitting: Internal Medicine

## 2015-02-25 MED ORDER — GLIMEPIRIDE 4 MG PO TABS: 4.0000 mg | ORAL_TABLET | Freq: Two times a day (BID) | ORAL | Status: DC

## 2015-03-25 ENCOUNTER — Ambulatory Visit: Payer: 59 | Admitting: Medical

## 2015-03-25 DIAGNOSIS — Z0289 Encounter for other administrative examinations: Secondary | ICD-10-CM

## 2015-04-03 ENCOUNTER — Encounter: Payer: Self-pay | Admitting: Medical

## 2015-04-03 ENCOUNTER — Telehealth: Payer: Self-pay | Admitting: Medical

## 2015-04-03 NOTE — Telephone Encounter (Signed)
Yes. Can charge.

## 2015-04-03 NOTE — Telephone Encounter (Signed)
Pt was no show for appt 03/25/15- letter sent. Charge?

## 2015-04-09 ENCOUNTER — Other Ambulatory Visit: Payer: Self-pay | Admitting: Podiatry

## 2015-04-09 DIAGNOSIS — M79671 Pain in right foot: Secondary | ICD-10-CM

## 2015-04-16 ENCOUNTER — Other Ambulatory Visit: Payer: 59

## 2015-05-10 ENCOUNTER — Other Ambulatory Visit: Payer: Self-pay | Admitting: Family Medicine

## 2015-05-13 ENCOUNTER — Ambulatory Visit (INDEPENDENT_AMBULATORY_CARE_PROVIDER_SITE_OTHER): Payer: 59 | Admitting: Family Medicine

## 2015-05-13 ENCOUNTER — Encounter: Payer: Self-pay | Admitting: Family Medicine

## 2015-05-13 VITALS — BP 138/60 | HR 103 | Temp 98.3°F | Ht 64.0 in | Wt 298.8 lb

## 2015-05-13 DIAGNOSIS — R21 Rash and other nonspecific skin eruption: Secondary | ICD-10-CM

## 2015-05-13 MED ORDER — CLOTRIMAZOLE-BETAMETHASONE 1-0.05 % EX CREA: 1.0000 "application " | TOPICAL_CREAM | Freq: Two times a day (BID) | CUTANEOUS | Status: DC

## 2015-05-13 NOTE — Progress Notes (Signed)
Patient ID: Andrea Marks, female    DOB: [birth year]  Age: 64 y.o. MRN: [id number]    Subjective:  Subjective HPI Andrea Marks presents for rash on both low legs R leg --- irregular circular area on calf.  + itchy , no pain. She also wore knee highs last week and it rolled down and now has bruising and rash that burns.   She has used cortisone otc on R low leg.    Review of Systems  Constitutional: Negative for activity change, appetite change, fatigue and unexpected weight change.  Eyes: Negative for itching.  Respiratory: Negative for cough and shortness of breath.   Cardiovascular: Negative for chest pain and palpitations.  Skin: Positive for rash. Negative for wound.  Psychiatric/Behavioral: Negative for behavioral problems and dysphoric mood. The patient is not nervous/anxious.     History Past Medical History  Diagnosis Date  . Depression   . Diabetes mellitus   . GERD (gastroesophageal reflux disease)   . Hyperlipidemia   . Hypertension   . Obesity     She has past surgical history that includes Abdominal hysterectomy (1991).   Her family history includes Cancer in her maternal aunt and other; Diabetes in her paternal grandmother, paternal uncle, paternal uncle, and paternal uncle; Heart disease in her mother, paternal uncle, paternal uncle, paternal uncle, paternal uncle, and paternal uncle; Lung cancer in an other family member; Lung disease (age of onset: 67) in her father; Macular degeneration in her mother.She reports that she has quit smoking. She has never used smokeless tobacco. She reports that she does not drink alcohol or use illicit drugs.  Current Outpatient Prescriptions on File Prior to Visit  Medication Sig Dispense Refill  . acetaminophen (TYLENOL ARTHRITIS PAIN) 650 MG CR tablet Take 650 mg by mouth 2 (two) times daily as needed. For pain    . aspirin 81 MG tablet Take 81 mg by mouth daily.      . benazepril (LOTENSIN) 20 MG tablet TAKE 1 TABLET  DAILY 90 tablet 1  . Blood Glucose Monitoring Suppl (ONE TOUCH ULTRA SYSTEM KIT) W/DEVICE KIT 1 kit by Does not apply route once. 1 each 0  . FLUoxetine (PROZAC) 20 MG capsule TAKE 3 CAPSULES DAILY 270 capsule 1  . glimepiride (AMARYL) 4 MG tablet Take 1 tablet (4 mg total) by mouth 2 (two) times daily. 180 tablet 0  . LANTUS SOLOSTAR 100 UNIT/ML Solostar Pen INJECT UNDER THE SKIN 25 UNITS OF LANTUS AT BEDTIME 15 mL 11  . MENEST 2.5 MG TABS TAKE 1 TABLET EVERY OTHER DAY 45 tablet 1  . metFORMIN (GLUCOPHAGE-XR) 750 MG 24 hr tablet Take 1 tablet (750 mg total) by mouth 3 (three) times daily. 270 tablet 2  . NOVOFINE 32G X 6 MM MISC USE 1 NEEDLE UNDER THE SKIN DAILY 100 each 1  . ONE TOUCH ULTRA TEST test strip     . ONETOUCH DELICA LANCETS FINE MISC USE AS INSTRUCTED 1 each 9  . VYTORIN 10-40 MG per tablet TAKE 1 TABLET DAILY (DUE FOR APPOINTMENT, NO FURTHER REFILLS) 90 tablet 0  . Exenatide ER (BYDUREON) 2 MG PEN 2 mg [place] 1x weekly (Patient not taking: Reported on 10/28/2014) 1 each 5  . ranitidine (ZANTAC) 300 MG capsule TAKE 1 CAPSULE DAILY (Patient not taking: Reported on 05/13/2015) 30 capsule 2   No current facility-administered medications on file prior to visit.     Objective:  Objective Physical Exam  Constitutional: She is oriented to person, place,  and time. She appears well-developed and well-nourished.  HENT:  Head: Normocephalic and atraumatic.  Eyes: Conjunctivae and EOM are normal.  Neck: Normal range of motion. Neck supple. No JVD present. Carotid bruit is not present. No thyromegaly present.  Cardiovascular: Normal rate, regular rhythm and normal heart sounds.   No murmur heard. Pulmonary/Chest: Effort normal and breath sounds normal. No respiratory distress. She has no wheezes. She has no rales. She exhibits no tenderness.  Musculoskeletal: She exhibits no edema.  Neurological: She is alert and oriented to person, place, and time.  Skin: Rash noted.     Psychiatric:  She has a normal mood and affect.   BP 138/60 mmHg  Pulse 103  Temp(Src) 98.3 F (36.8 C) (Oral)  Ht $R'5\' 4"'Qv$  (1.626 m)  Wt 298 lb 12.8 oz (135.535 kg)  BMI 51.26 kg/m2  SpO2 95% Wt Readings from Last 3 Encounters:  05/13/15 298 lb 12.8 oz (135.535 kg)  10/28/14 302 lb 6.4 oz (137.168 kg)  07/12/14 302 lb 3.2 oz (137.077 kg)     Lab Results  Component Value Date   WBC 9.2 07/12/2014   HGB 13.9 07/12/2014   HCT 42.4 07/12/2014   PLT 205 07/12/2014   GLUCOSE 339* 07/12/2014   CHOL 106 07/12/2014   TRIG 176* 07/12/2014   HDL 58 07/12/2014   LDLCALC 13 07/12/2014   ALT 16 07/12/2014   AST 21 07/12/2014   NA 138 07/12/2014   K 4.9 07/12/2014   CL 99 07/12/2014   CREATININE 0.89 07/12/2014   BUN 19 07/12/2014   CO2 28 07/12/2014   TSH 2.881 06/09/2012   INR 1.2 RATIO 04/19/2007   HGBA1C 10.1* 07/12/2014   MICROALBUR 0.57 07/12/2014    No results found.   Assessment & Plan:  Plan I have discontinued Ms. Baskerville's azithromycin and benzonatate. I am also having her start on clotrimazole-betamethasone. Additionally, I am having her maintain her acetaminophen, aspirin, NOVOFINE, ONE TOUCH ULTRA SYSTEM KIT, ONE TOUCH ULTRA TEST, ONETOUCH DELICA LANCETS FINE, LANTUS SOLOSTAR, Exenatide ER, metFORMIN, ranitidine, benazepril, FLUoxetine, MENEST, glimepiride, VYTORIN, Hydrocodone-Acetaminophen, and meloxicam.  Meds ordered this encounter  Medications  . Hydrocodone-Acetaminophen 7.5-300 MG TABS    Sig: Take 1 tablet by mouth every 4 to 6 hours as needed for pain.  . meloxicam (MOBIC) 15 MG tablet    Sig: Take 1 tablet by mouth every day as needed for pain and swelling with food.    Refill:  0  . clotrimazole-betamethasone (LOTRISONE) cream    Sig: Apply 1 application topically 2 (two) times daily.    Dispense:  30 g    Refill:  0    Problem List Items Addressed This Visit    None    Visit Diagnoses    Rash and nonspecific skin eruption    -  Primary    Relevant  Medications    clotrimazole-betamethasone (LOTRISONE) cream       Mix with eucerin and if no relief will refer to dermatolgoy Follow-up: Return if symptoms worsen or fail to improve.  Garnet Koyanagi, DO

## 2015-05-13 NOTE — Progress Notes (Signed)
Pre visit review using our clinic review tool, if applicable. No additional management support is needed unless otherwise documented below in the visit note. 

## 2015-05-13 NOTE — Patient Instructions (Signed)

## 2015-05-23 ENCOUNTER — Ambulatory Visit: Payer: 59 | Admitting: Podiatry

## 2015-05-25 ENCOUNTER — Other Ambulatory Visit: Payer: Self-pay | Admitting: Internal Medicine

## 2015-06-10 ENCOUNTER — Other Ambulatory Visit: Payer: Self-pay | Admitting: Internal Medicine

## 2015-06-12 ENCOUNTER — Ambulatory Visit (INDEPENDENT_AMBULATORY_CARE_PROVIDER_SITE_OTHER): Payer: 59 | Admitting: Podiatry

## 2015-06-12 ENCOUNTER — Encounter: Payer: Self-pay | Admitting: Podiatry

## 2015-06-12 VITALS — BP 129/69 | HR 83 | Temp 99.3°F | Resp 14 | Ht 63.0 in | Wt 289.0 lb

## 2015-06-12 DIAGNOSIS — B351 Tinea unguium: Secondary | ICD-10-CM

## 2015-06-12 DIAGNOSIS — M79676 Pain in unspecified toe(s): Secondary | ICD-10-CM

## 2015-06-12 NOTE — Progress Notes (Signed)
   Subjective:    Patient ID: Andrea Marks, female    DOB: [birth year], 64 y.o.   MRN: [id number]  HPI  Patient presents here today for B/L toenail trim.   Review of Systems  Constitutional: Positive for fatigue.  Musculoskeletal:       Joint pain  Skin: Positive for rash.  Hematological: Bruises/bleeds easily.       Objective:   Physical Exam        Assessment & Plan:

## 2015-06-13 NOTE — Progress Notes (Signed)
Patient ID: Andrea Marks, female   DOB: 06/11/1951, 64 y.o.   MRN: [id number] Complaint:  Visit Type: Patient returns to my office for continued preventative foot care services. Complaint: Patient states" my nails have grown long and thick and become painful to walk and wear shoes".  Painful callus on bottom of both big toes. Patient has been diagnosed with DM with no foot complications. The patient presents for preventative foot care services. No changes to ROS  Podiatric Exam: Vascular: dorsalis pedis and posterior tibial pulses are not palpable bilateral due to foot swelling.. Capillary return is immediate. Temperature gradient is WNL. Skin turgor WNL  Sensorium: Diminished Semmes Weinstein monofilament test. Normal tactile sensation bilaterally. Nail Exam: Pt has thick disfigured discolored nails with subungual debris noted bilateral entire nail hallux through fifth toenails Ulcer Exam: There is no evidence of ulcer or pre-ulcerative changes or infection. Orthopedic Exam: Muscle tone and strength are WNL. No limitations in general ROM. No crepitus or effusions noted. Foot type and digits show no abnormalities. Bony prominences are unremarkable. Skin: No Porokeratosis. No infection or ulcers.  Pinch callus hallux B/L.  Diagnosis:  Onychomycosis, , Pain in right toe, pain in left toes  Treatment & Plan Procedures and Treatment: Consent by patient was obtained for treatment procedures. The patient understood the discussion of treatment and procedures well. All questions were answered thoroughly reviewed. Debridement of mycotic and hypertrophic toenails, 1 through 5 bilateral and clearing of subungual debris. No ulceration, no infection noted.  Return Visit-Office Procedure: Patient instructed to return to the office for a follow up visit 3 months for continued evaluation and treatment. 

## 2015-07-14 ENCOUNTER — Other Ambulatory Visit: Payer: Self-pay | Admitting: Internal Medicine

## 2015-07-14 ENCOUNTER — Other Ambulatory Visit: Payer: Self-pay | Admitting: Family Medicine

## 2015-08-04 ENCOUNTER — Other Ambulatory Visit: Payer: Self-pay | Admitting: Family Medicine

## 2015-08-05 ENCOUNTER — Telehealth: Payer: Self-pay | Admitting: Family Medicine

## 2015-08-05 MED ORDER — MENEST 1.25 MG PO TABS: 2.0000 | ORAL_TABLET | ORAL | Status: DC

## 2015-08-05 MED ORDER — RANITIDINE HCL 300 MG PO CAPS: 300.0000 mg | ORAL_CAPSULE | Freq: Every day | ORAL | Status: DC

## 2015-08-05 NOTE — Telephone Encounter (Signed)
If pt is ok with that we will change it

## 2015-08-05 NOTE — Telephone Encounter (Signed)
Caller name:Wendy Relationship to patient: Can be reached:[phone] Ref # [id number] Pharmacy:[facility]  Reason for call:Menest tab 2.5mg  has been discontinued, requesting call back

## 2015-08-05 NOTE — Telephone Encounter (Signed)
Rx faxed.    KP 

## 2015-08-05 NOTE — Telephone Encounter (Signed)
Ok to change in chart and fax

## 2015-08-05 NOTE — Telephone Encounter (Signed)
Spoke with patient and she is ok with the switch. Please advise if it ok to change in the chart and fax.        KP

## 2015-08-05 NOTE — Telephone Encounter (Signed)
Spoke with Jonny Ruiz and [employer] and the Menest dose that the patient is taking has been discontinued. The alternative is Menest 1.25, it is the only dose available. Please advise     KP

## 2015-08-07 ENCOUNTER — Encounter: Payer: Self-pay | Admitting: Family Medicine

## 2015-08-07 ENCOUNTER — Ambulatory Visit (INDEPENDENT_AMBULATORY_CARE_PROVIDER_SITE_OTHER): Payer: 59 | Admitting: Family Medicine

## 2015-08-07 ENCOUNTER — Ambulatory Visit: Payer: 59 | Admitting: Podiatry

## 2015-08-07 VITALS — BP 136/74 | HR 88 | Temp 99.3°F | Wt 293.4 lb

## 2015-08-07 DIAGNOSIS — I10 Essential (primary) hypertension: Secondary | ICD-10-CM | POA: Diagnosis not present

## 2015-08-07 DIAGNOSIS — E1169 Type 2 diabetes mellitus with other specified complication: Secondary | ICD-10-CM

## 2015-08-07 DIAGNOSIS — E119 Type 2 diabetes mellitus without complications: Secondary | ICD-10-CM

## 2015-08-07 DIAGNOSIS — R21 Rash and other nonspecific skin eruption: Secondary | ICD-10-CM | POA: Diagnosis not present

## 2015-08-07 DIAGNOSIS — E669 Obesity, unspecified: Secondary | ICD-10-CM

## 2015-08-07 DIAGNOSIS — E785 Hyperlipidemia, unspecified: Secondary | ICD-10-CM

## 2015-08-07 LAB — POCT URINALYSIS DIPSTICK
Bilirubin, UA: NEGATIVE
Blood, UA: NEGATIVE
Ketones, UA: 4
Leukocytes, UA: NEGATIVE
Nitrite, UA: NEGATIVE
Protein, UA: NEGATIVE
Spec Grav, UA: 1.02
Urobilinogen, UA: 0.2
pH, UA: 5

## 2015-08-07 LAB — CBC WITH DIFFERENTIAL/PLATELET
Basophils Absolute: 0 10*3/uL (ref 0.0–0.1)
Basophils Relative: 0.4 % (ref 0.0–3.0)
Eosinophils Absolute: 0.1 10*3/uL (ref 0.0–0.7)
Eosinophils Relative: 1.2 % (ref 0.0–5.0)
HCT: 45.3 % (ref 36.0–46.0)
Hemoglobin: 14.5 g/dL (ref 12.0–15.0)
Lymphocytes Relative: 14.5 % (ref 12.0–46.0)
Lymphs Abs: 1.6 10*3/uL (ref 0.7–4.0)
MCHC: 32 g/dL (ref 30.0–36.0)
MCV: 87.4 fl (ref 78.0–100.0)
Monocytes Absolute: 0.4 10*3/uL (ref 0.1–1.0)
Monocytes Relative: 4 % (ref 3.0–12.0)
Neutro Abs: 8.8 10*3/uL — ABNORMAL HIGH (ref 1.4–7.7)
Neutrophils Relative %: 79.9 % — ABNORMAL HIGH (ref 43.0–77.0)
Platelets: 195 10*3/uL (ref 150.0–400.0)
RBC: 5.18 Mil/uL — ABNORMAL HIGH (ref 3.87–5.11)
RDW: 14.5 % (ref 11.5–15.5)
WBC: 11 10*3/uL — ABNORMAL HIGH (ref 4.0–10.5)

## 2015-08-07 LAB — COMPREHENSIVE METABOLIC PANEL
ALT: 15 U/L (ref 0–35)
AST: 27 U/L (ref 0–37)
Albumin: 3.6 g/dL (ref 3.5–5.2)
Alkaline Phosphatase: 53 U/L (ref 39–117)
BUN: 17 mg/dL (ref 6–23)
CO2: 26 mEq/L (ref 19–32)
Calcium: 9.2 mg/dL (ref 8.4–10.5)
Chloride: 99 mEq/L (ref 96–112)
Creatinine, Ser: 0.82 mg/dL (ref 0.40–1.20)
GFR: 74.49 mL/min (ref >60.00)
Glucose, Bld: 366 mg/dL — ABNORMAL HIGH (ref 70–99)
Potassium: 4.7 mEq/L (ref 3.5–5.1)
Sodium: 134 mEq/L — ABNORMAL LOW (ref 135–145)
Total Bilirubin: 0.5 mg/dL (ref 0.2–1.2)
Total Protein: 6.5 g/dL (ref 6.0–8.3)

## 2015-08-07 LAB — LIPID PANEL
Cholesterol: 113 mg/dL (ref 0–200)
HDL: 53.5 mg/dL (ref >39.00)
LDL Cholesterol: 24 mg/dL (ref 0–99)
NonHDL: 59.35
Total CHOL/HDL Ratio: 2
Triglycerides: 179 mg/dL — ABNORMAL HIGH (ref 0.0–149.0)
VLDL: 35.8 mg/dL (ref 0.0–40.0)

## 2015-08-07 LAB — MICROALBUMIN / CREATININE URINE RATIO
Creatinine,U: 62.4 mg/dL
Microalb Creat Ratio: 1.4 mg/g (ref 0.0–30.0)
Microalb, Ur: 0.9 mg/dL (ref 0.0–1.9)

## 2015-08-07 LAB — HEMOGLOBIN A1C: Hgb A1c MFr Bld: 10.7 % — ABNORMAL HIGH (ref 4.6–6.5)

## 2015-08-07 LAB — TSH: TSH: 3.22 u[IU]/mL (ref 0.35–4.50)

## 2015-08-07 MED ORDER — CLOTRIMAZOLE-BETAMETHASONE 1-0.05 % EX CREA: 1.0000 "application " | TOPICAL_CREAM | Freq: Two times a day (BID) | CUTANEOUS | Status: DC

## 2015-08-07 NOTE — Progress Notes (Signed)
Pre visit review using our clinic review tool, if applicable. No additional management support is needed unless otherwise documented below in the visit note. 

## 2015-08-07 NOTE — Progress Notes (Signed)
Patient ID: Andrea Marks, female   DOB: Feb 25, 1951, 64 y.o.   MRN: [id number]   Subjective:    Patient ID: Andrea Marks, female    DOB: 1951-02-06, 64 y.o.   MRN: [id number]  Chief Complaint  Patient presents with  . Rash    on the right lower leg x's a few weeks    HPI Patient is in today for rash on R low leg x few weeks.   The cream works but it comes right back.    Past Medical History  Diagnosis Date  . Depression   . Diabetes mellitus   . GERD (gastroesophageal reflux disease)   . Hyperlipidemia   . Hypertension   . Obesity     Past Surgical History  Procedure Laterality Date  . Abdominal hysterectomy  1991    BSO    Family History  Problem Relation Age of Onset  . Macular degeneration Mother   . Heart disease Mother     syncope  . Lung cancer      asbestos  . Lung disease Father 72    mesothelioma  . Cancer Maternal Aunt     stomach  . Heart disease Paternal Uncle     cabg  . Diabetes Paternal Uncle   . Diabetes Paternal Grandmother   . Heart disease Paternal Uncle   . Diabetes Paternal Uncle   . Diabetes Paternal Uncle   . Heart disease Paternal Uncle   . Heart disease Paternal Uncle   . Heart disease Paternal Uncle   . Cancer Other     lung    Social History   Social History  . Marital Status: Married    Spouse Name: N/A  . Number of Children: 0  . Years of Education: 13   Occupational History  . retired from [employer]    Social History Main Topics  . Smoking status: Former Research scientist (life sciences)  . Smokeless tobacco: Never Used  . Alcohol Use: No  . Drug Use: No  . Sexual Activity:    Partners: Male   Other Topics Concern  . Not on file   Social History Narrative   No reg exercise    Outpatient Prescriptions Prior to Visit  Medication Sig Dispense Refill  . acetaminophen (TYLENOL ARTHRITIS PAIN) 650 MG CR tablet Take 650 mg by mouth 2 (two) times daily as needed. For pain    . aspirin 81 MG tablet Take 81 mg by mouth daily.       . benazepril (LOTENSIN) 20 MG tablet TAKE 1 TABLET DAILY 90 tablet 1  . Blood Glucose Monitoring Suppl (ONE TOUCH ULTRA SYSTEM KIT) W/DEVICE KIT 1 kit by Does not apply route once. 1 each 0  . FLUoxetine (PROZAC) 20 MG capsule TAKE 3 CAPSULES DAILY 270 capsule 1  . glimepiride (AMARYL) 4 MG tablet Take 1 tablet (4 mg total) by mouth 2 (two) times daily. 180 tablet 0  . LANTUS SOLOSTAR 100 UNIT/ML Solostar Pen INJECT 25 UNITS UNDER THE SKIN AT BEDTIME 30 mL 0  . MENEST 1.25 MG TABS Take 2 tablets (2.5 mg total) by mouth every other day. 90 tablet 1  . metFORMIN (GLUCOPHAGE-XR) 750 MG 24 hr tablet TAKE 1 TABLET THREE TIMES A DAY 270 tablet 1  . NOVOFINE 32G X 6 MM MISC USE 1 NEEDLE UNDER THE SKIN DAILY 100 each 1  . ONE TOUCH ULTRA TEST test strip     . ONETOUCH DELICA LANCETS FINE MISC USE AS INSTRUCTED 1 each  9  . ranitidine (ZANTAC) 300 MG capsule Take 1 capsule (300 mg total) by mouth daily. 90 capsule 3  . VYTORIN 10-40 MG per tablet TAKE 1 TABLET DAILY (DUE FOR APPOINTMENT, NO FURTHER REFILLS) 90 tablet 0  . clotrimazole-betamethasone (LOTRISONE) cream Apply 1 application topically 2 (two) times daily. 30 g 0  . Hydrocodone-Acetaminophen 7.5-300 MG TABS Take 1 tablet by mouth every 4 to 6 hours as needed for pain.    . meloxicam (MOBIC) 15 MG tablet Take 1 tablet by mouth every day as needed for pain and swelling with food.  0   No facility-administered medications prior to visit.    Allergies  Allergen Reactions  . Hydrocodone-Acetaminophen Other (See Comments)    unknown  . Oxycodone Hcl Other (See Comments)    unknown  . Penicillins Other (See Comments)    unknown  . Prednisone     Review of Systems  Constitutional: Negative for fever, chills and malaise/fatigue.  HENT: Positive for ear pain. Negative for congestion and hearing loss.   Eyes: Negative for discharge.  Respiratory: Negative for cough, sputum production and shortness of breath.   Cardiovascular: Negative for  chest pain, palpitations and leg swelling.  Gastrointestinal: Negative for heartburn, nausea, vomiting, abdominal pain, diarrhea, constipation and blood in stool.  Genitourinary: Negative for dysuria, urgency, frequency and hematuria.  Musculoskeletal: Negative for myalgias, back pain and falls.  Skin: Negative for rash.  Neurological: Negative for dizziness, sensory change, loss of consciousness, weakness and headaches.  Endo/Heme/Allergies: Negative for environmental allergies. Does not bruise/bleed easily.  Psychiatric/Behavioral: Negative for depression and suicidal ideas. The patient is not nervous/anxious and does not have insomnia.        Objective:    Physical Exam  Constitutional: She is oriented to person, place, and time. She appears well-developed and well-nourished.  HENT:  Head: Normocephalic and atraumatic.  Right Ear: External ear normal.  Left Ear: External ear normal.  Eyes: Conjunctivae and EOM are normal.  Neck: Normal range of motion. Neck supple. No JVD present. Carotid bruit is not present. No thyromegaly present.  Cardiovascular: Normal rate, regular rhythm and normal heart sounds.   No murmur heard. Pulmonary/Chest: Effort normal and breath sounds normal. No respiratory distress. She has no wheezes. She has no rales. She exhibits no tenderness.  Musculoskeletal: She exhibits no edema.  Neurological: She is alert and oriented to person, place, and time.  Skin: Rash noted. Rash is macular and papular. No erythema. No pallor.     Psychiatric: She has a normal mood and affect.    BP 136/74 mmHg  Pulse 88  Temp(Src) 99.3 F (37.4 C) (Oral)  Wt 293 lb 6.4 oz (133.085 kg)  SpO2 99% Wt Readings from Last 3 Encounters:  08/07/15 293 lb 6.4 oz (133.085 kg)  06/12/15 289 lb (131.09 kg)  05/13/15 298 lb 12.8 oz (135.535 kg)     Lab Results  Component Value Date   WBC 11.0* 08/07/2015   HGB 14.5 08/07/2015   HCT 45.3 08/07/2015   PLT 195.0 08/07/2015    GLUCOSE 366* 08/07/2015   CHOL 113 08/07/2015   TRIG 179.0* 08/07/2015   HDL 53.50 08/07/2015   LDLCALC 24 08/07/2015   ALT 15 08/07/2015   AST 27 08/07/2015   NA 134* 08/07/2015   K 4.7 08/07/2015   CL 99 08/07/2015   CREATININE 0.82 08/07/2015   BUN 17 08/07/2015   CO2 26 08/07/2015   TSH 3.22 08/07/2015   INR 1.2 RATIO  04/19/2007   HGBA1C 10.7* 08/07/2015   MICROALBUR 0.9 08/07/2015    Lab Results  Component Value Date   TSH 3.22 08/07/2015   Lab Results  Component Value Date   WBC 11.0* 08/07/2015   HGB 14.5 08/07/2015   HCT 45.3 08/07/2015   MCV 87.4 08/07/2015   PLT 195.0 08/07/2015   Lab Results  Component Value Date   NA 134* 08/07/2015   K 4.7 08/07/2015   CO2 26 08/07/2015   GLUCOSE 366* 08/07/2015   BUN 17 08/07/2015   CREATININE 0.82 08/07/2015   BILITOT 0.5 08/07/2015   ALKPHOS 53 08/07/2015   AST 27 08/07/2015   ALT 15 08/07/2015   PROT 6.5 08/07/2015   ALBUMIN 3.6 08/07/2015   CALCIUM 9.2 08/07/2015   GFR 74.49 08/07/2015   Lab Results  Component Value Date   CHOL 113 08/07/2015   Lab Results  Component Value Date   HDL 53.50 08/07/2015   Lab Results  Component Value Date   LDLCALC 24 08/07/2015   Lab Results  Component Value Date   TRIG 179.0* 08/07/2015   Lab Results  Component Value Date   CHOLHDL 2 08/07/2015   Lab Results  Component Value Date   HGBA1C 10.7* 08/07/2015       Assessment & Plan:   Problem List Items Addressed This Visit    Hyperlipidemia   Relevant Orders   Lipid panel (Completed)   POCT urinalysis dipstick (Completed)   Microalbumin / creatinine urine ratio (Completed)   Essential hypertension   Relevant Orders   CBC with Differential/Platelet (Completed)   Comprehensive metabolic panel (Completed)   POCT urinalysis dipstick (Completed)   Microalbumin / creatinine urine ratio (Completed)   TSH (Completed)   Diabetes mellitus type 2 in obese (HCC)   Relevant Orders   Hemoglobin A1c  (Completed)   POCT urinalysis dipstick (Completed)   Microalbumin / creatinine urine ratio (Completed)    Other Visit Diagnoses    Rash    -  Primary    Relevant Orders    Ambulatory referral to Dermatology    Rash and nonspecific skin eruption        Relevant Medications    clotrimazole-betamethasone (LOTRISONE) cream       I have discontinued Ms. Krantz's Hydrocodone-Acetaminophen and meloxicam. I am also having her maintain her acetaminophen, aspirin, NOVOFINE, ONE TOUCH ULTRA SYSTEM KIT, ONE TOUCH ULTRA TEST, ONETOUCH DELICA LANCETS FINE, glimepiride, VYTORIN, LANTUS SOLOSTAR, metFORMIN, benazepril, FLUoxetine, ranitidine, MENEST, and clotrimazole-betamethasone.  Meds ordered this encounter  Medications  . clotrimazole-betamethasone (LOTRISONE) cream    Sig: Apply 1 application topically 2 (two) times daily.    Dispense:  30 g    Refill:  0     Garnet Koyanagi, DO

## 2015-08-07 NOTE — Patient Instructions (Addendum)
Apply one part cream and two parts Eucerin or Aquaphor (over the counter in a jar) on rash to help with dryness and rash. Plain zyrtec(cetirizine) or Claritin (loratidine) over the counter daily for allergies/itching   Contact Dermatitis Dermatitis is redness, soreness, and swelling (inflammation) of the skin. Contact dermatitis is a reaction to certain substances that touch the skin. There are two types of contact dermatitis:   Irritant contact dermatitis. This type is caused by something that irritates your skin, such as dry hands from washing them too much. This type does not require previous exposure to the substance for a reaction to occur. This type is more common.  Allergic contact dermatitis. This type is caused by a substance that you are allergic to, such as a nickel allergy or poison ivy. This type only occurs if you have been exposed to the substance (allergen) before. Upon a repeat exposure, your body reacts to the substance. This type is less common. CAUSES  Many different substances can cause contact dermatitis. Irritant contact dermatitis is most commonly caused by exposure to:   Makeup.   Soaps.   Detergents.   Bleaches.   Acids.   Metal salts, such as nickel.  Allergic contact dermatitis is most commonly caused by exposure to:   Poisonous plants.   Chemicals.   Jewelry.   Latex.   Medicines.   Preservatives in products, such as clothing.  RISK FACTORS This condition is more likely to develop in:   People who have jobs that expose them to irritants or allergens.  People who have certain medical conditions, such as asthma or eczema.  SYMPTOMS  Symptoms of this condition may occur anywhere on your body where the irritant has touched you or is touched by you. Symptoms include:  Dryness or flaking.   Redness.   Cracks.   Itching.   Pain or a burning feeling.   Blisters.  Drainage of small amounts of blood or clear fluid from skin  cracks. With allergic contact dermatitis, there may also be swelling in areas such as the eyelids, mouth, or genitals.  DIAGNOSIS  This condition is diagnosed with a medical history and physical exam. A patch skin test may be performed to help determine the cause. If the condition is related to your job, you may need to see an occupational medicine specialist. TREATMENT Treatment for this condition includes figuring out what caused the reaction and protecting your skin from further contact. Treatment may also include:   Steroid creams or ointments. Oral steroid medicines may be needed in more severe cases.  Antibiotics or antibacterial ointments, if a skin infection is present.  Antihistamine lotion or an antihistamine taken by mouth to ease itching.  A bandage (dressing). HOME CARE INSTRUCTIONS Skin Care  Moisturize your skin as needed.   Apply cool compresses to the affected areas.  Try taking a bath with:  Epsom salts. Follow the instructions on the packaging. You can get these at your local pharmacy or grocery store.  Baking soda. Pour a small amount into the bath as directed by your health care provider.  Colloidal oatmeal. Follow the instructions on the packaging. You can get this at your local pharmacy or grocery store.  Try applying baking soda paste to your skin. Stir water into baking soda until it reaches a paste-like consistency.  Do not scratch your skin.  Bathe less frequently, such as every other day.  Bathe in lukewarm water. Avoid using hot water. Medicines  Take or apply over-the-counter  and prescription medicines only as told by your health care provider.   If you were prescribed an antibiotic medicine, take or apply your antibiotic as told by your health care provider. Do not stop using the antibiotic even if your condition starts to improve. General Instructions  Keep all follow-up visits as told by your health care provider. This is  important.  Avoid the substance that caused your reaction. If you do not know what caused it, keep a journal to try to track what caused it. Write down:  What you eat.  What cosmetic products you use.  What you drink.  What you wear in the affected area. This includes jewelry.  If you were given a dressing, take care of it as told by your health care provider. This includes when to change and remove it. SEEK MEDICAL CARE IF:   Your condition does not improve with treatment.  Your condition gets worse.  You have signs of infection such as swelling, tenderness, redness, soreness, or warmth in the affected area.  You have a fever.  You have new symptoms. SEEK IMMEDIATE MEDICAL CARE IF:   You have a severe headache, neck pain, or neck stiffness.  You vomit.  You feel very sleepy.  You notice red streaks coming from the affected area.  Your bone or joint underneath the affected area becomes painful after the skin has healed.  The affected area turns darker.  You have difficulty breathing.   This information is not intended to replace advice given to you by your health care provider. Make sure you discuss any questions you have with your health care provider.   Document Released: 10/15/2000 Document Revised: 07/09/2015 Document Reviewed: 03/05/2015 Elsevier Interactive Patient Education [employer]

## 2015-08-08 ENCOUNTER — Other Ambulatory Visit: Payer: Self-pay | Admitting: Family Medicine

## 2015-08-25 ENCOUNTER — Telehealth: Payer: Self-pay | Admitting: *Deleted

## 2015-08-25 DIAGNOSIS — E669 Obesity, unspecified: Principal | ICD-10-CM

## 2015-08-25 DIAGNOSIS — E1169 Type 2 diabetes mellitus with other specified complication: Secondary | ICD-10-CM

## 2015-08-25 NOTE — Telephone Encounter (Signed)
con't pm dose --- add 10 u in am

## 2015-08-25 NOTE — Telephone Encounter (Signed)
Pt returned my call re: below results.  Pt has been notified and voices understanding.  She requests to see Dr Elvera LennoxGherghe and states it has been greater than 1 yr since she saw her last. New referral has been placed. Please clarify lantus directions. Pt currently taking 25 units at bedtime and below says add 10 units in the a.m or should pt add 10 units to the nighttime dose? If she should add to nighttime dose then we need to send her a mychart message to notify her otherwise she will take as below.  Thanks.  Notes Recorded by Lelon PerlaYvonne R Lowne, DO on 08/12/2015 at 10:07 AM DM not controlled--- con't Lantus-- add 10 u in am Refer to endo Recheck 6 months----lipid, cmp, hgba1c----dm, hyperlipidemia,

## 2015-08-26 NOTE — Telephone Encounter (Signed)
My-chart message sent as requested.     KP

## 2015-09-03 ENCOUNTER — Encounter: Payer: Self-pay | Admitting: Podiatry

## 2015-09-03 ENCOUNTER — Ambulatory Visit (INDEPENDENT_AMBULATORY_CARE_PROVIDER_SITE_OTHER): Payer: 59 | Admitting: Podiatry

## 2015-09-03 DIAGNOSIS — B351 Tinea unguium: Secondary | ICD-10-CM | POA: Diagnosis not present

## 2015-09-03 DIAGNOSIS — M79676 Pain in unspecified toe(s): Secondary | ICD-10-CM

## 2015-09-03 NOTE — Progress Notes (Signed)
Patient ID: [name], female   DOB: 1951-10-26, 64 y.o.   MRN: [id number] Complaint:  Visit Type: Patient returns to my office for continued preventative foot care services. Complaint: Patient states" my nails have grown long and thick and become painful to walk and wear shoes".  Painful callus on bottom of both big toes. Patient has been diagnosed with DM with no foot complications. The patient presents for preventative foot care services. No changes to ROS  Podiatric Exam: Vascular: dorsalis pedis and posterior tibial pulses are not palpable bilateral due to foot swelling.. Capillary return is immediate. Temperature gradient is WNL. Skin turgor WNL  Sensorium: Diminished Semmes Weinstein monofilament test. Normal tactile sensation bilaterally. Nail Exam: Pt has thick disfigured discolored nails with subungual debris noted bilateral entire nail hallux through fifth toenails Ulcer Exam: There is no evidence of ulcer or pre-ulcerative changes or infection. Orthopedic Exam: Muscle tone and strength are WNL. No limitations in general ROM. No crepitus or effusions noted. Foot type and digits show no abnormalities. Bony prominences are unremarkable. Skin: No Porokeratosis. No infection or ulcers.  Pinch callus hallux B/L.  Diagnosis:  Onychomycosis, , Pain in right toe, pain in left toes  Treatment & Plan Procedures and Treatment: Consent by patient was obtained for treatment procedures. The patient understood the discussion of treatment and procedures well. All questions were answered thoroughly reviewed. Debridement of mycotic and hypertrophic toenails, 1 through 5 bilateral and clearing of subungual debris. No ulceration, no infection noted.  Return Visit-Office Procedure: Patient instructed to return to the office for a follow up visit 3 months for continued evaluation and treatment.

## 2015-09-07 ENCOUNTER — Other Ambulatory Visit: Payer: Self-pay | Admitting: Internal Medicine

## 2015-09-17 ENCOUNTER — Ambulatory Visit: Payer: 59 | Admitting: Internal Medicine

## 2015-10-14 LAB — HM DIABETES EYE EXAM

## 2015-10-18 ENCOUNTER — Other Ambulatory Visit: Payer: Self-pay | Admitting: Internal Medicine

## 2015-10-21 ENCOUNTER — Telehealth: Payer: Self-pay | Admitting: *Deleted

## 2015-10-21 ENCOUNTER — Other Ambulatory Visit: Payer: Self-pay | Admitting: *Deleted

## 2015-10-21 ENCOUNTER — Telehealth: Payer: Self-pay | Admitting: Behavioral Health

## 2015-10-21 MED ORDER — GLIMEPIRIDE 4 MG PO TABS: 4.0000 mg | ORAL_TABLET | Freq: Two times a day (BID) | ORAL | Status: DC

## 2015-10-21 NOTE — Telephone Encounter (Signed)
Sent 30 day supply to pt's local pharmacy.

## 2015-10-21 NOTE — Telephone Encounter (Signed)
OK for 30 days - as we may need to change tx - i am not sure yet.

## 2015-10-21 NOTE — Telephone Encounter (Signed)
Assessed the following care gaps: A1c less than 8% Mammogram  Colonoscopy (per health maintenance, patient declined) Flu (per health maintenance, patient declined)  Patient is scheduled for a three month follow-up for diabetes, etc. 11/07/15 with PCP.

## 2015-10-21 NOTE — Telephone Encounter (Signed)
Pt requested a refill of her glimepiride 4 mg. Pt has not been seen since 3/15. Pt has an appt on 12/22. Please advise if ok to send refill or will pt need to wait until Thurs?

## 2015-10-23 ENCOUNTER — Telehealth: Payer: Self-pay | Admitting: Internal Medicine

## 2015-10-23 ENCOUNTER — Ambulatory Visit (INDEPENDENT_AMBULATORY_CARE_PROVIDER_SITE_OTHER): Payer: 59 | Admitting: Internal Medicine

## 2015-10-23 ENCOUNTER — Encounter: Payer: Self-pay | Admitting: Internal Medicine

## 2015-10-23 VITALS — BP 118/62 | HR 86 | Temp 98.2°F | Resp 12 | Wt 286.6 lb

## 2015-10-23 DIAGNOSIS — E1165 Type 2 diabetes mellitus with hyperglycemia: Secondary | ICD-10-CM

## 2015-10-23 DIAGNOSIS — Z794 Long term (current) use of insulin: Secondary | ICD-10-CM | POA: Insufficient documentation

## 2015-10-23 MED ORDER — GLIMEPIRIDE 4 MG PO TABS: 4.0000 mg | ORAL_TABLET | Freq: Two times a day (BID) | ORAL | Status: DC

## 2015-10-23 MED ORDER — DULAGLUTIDE 1.5 MG/0.5ML ~~LOC~~ SOAJ: 1.5000 mg | SUBCUTANEOUS | Status: DC

## 2015-10-23 MED ORDER — LIRAGLUTIDE 18 MG/3ML ~~LOC~~ SOPN: PEN_INJECTOR | SUBCUTANEOUS | Status: DC

## 2015-10-23 NOTE — Telephone Encounter (Signed)
Please read message below and advise.  

## 2015-10-23 NOTE — Telephone Encounter (Signed)
Pharmacy states the victoza is not going to be covered by insurance, there is an alternate bydureon and trulicity.

## 2015-10-23 NOTE — Telephone Encounter (Signed)
Ok, I think she had Bydureon before and she did not like it >> let's send Trulicity 1.5 mg weekly. If she has pbs injecting it , we can always have her come and see Bonita QuinLinda for a quick demonstration.

## 2015-10-23 NOTE — Patient Instructions (Signed)
Please change: - Lantus to 35 units at night - Metformin XR 2250 mg (3x750 mg) at dinnertime - Amaryl 4 mg 2x a da, before b'fast and before dinner  Start back Victoza 0.6 mg daily in the morning and increase by 0.6 mg every 5 days until you get to 1.8 mg daily.  Try to get a ReliOn glucometer.  Please return in 1-1.5 months with your sugar log.

## 2015-10-23 NOTE — Progress Notes (Signed)
Patient ID: Andrea Marks, female   DOB: Feb 05, 1951, 64 y.o.   MRN: [id number]  HPI: Andrea Marks is a 64 y.o.-year-old female, returning for f/u for DM2, dx 1994, insulin-dependent, uncontrolled, without complications. Last visit was 1 year and 9 mo ago.  Last hemoglobin A1c was: Lab Results  Component Value Date   HGBA1C 10.7* 08/07/2015   HGBA1C 10.1* 07/12/2014   HGBA1C 7.3* 01/11/2014   Pt is on a regimen of: - Lantus 25 units qhs >> change to 10 units in am and 25 units in HS after the last hemoglobin A1c returned high - Metformin XR 2250 mg po hs (3x750 mg) - Amaryl 4 mg bid  Tried Bydureon once a week >> she had problems injecting the medication. She stopped Victoza then and did not restart.  Not checking sugars. Sugars from before: - A.m.: 165-256, most sugars in the low 200s >> 113-180 >> 85-170 >> 180  - pm: 171-364 >> 113-160 >> n/c ? Lows.  She has ? hypoglycemia awareness.   Pt does not have chronic kidney disease, last BUN/creatinine was:  Lab Results  Component Value Date   BUN 17 08/07/2015   CREATININE 0.82 08/07/2015  She is on Benazepril. Last set of lipids: Lab Results  Component Value Date   CHOL 113 08/07/2015   HDL 53.50 08/07/2015   LDLCALC 24 08/07/2015   TRIG 179.0* 08/07/2015   CHOLHDL 2 08/07/2015  On Vytorin. Pt's last eye exam was on 10/2015 (Dr. Nelle DonHollander). No DR.  Denies numbness and tingling in her legs. She sees a Risk analystodiatrist - Dr. Stacie AcresMayer.  She had an ulcer in lower right leg (has lymphedema).  She also has a history of HL, HTN, GERD, depression, h/o thrombophlebitis.  I reviewed pt's medications, allergies, PMH, social hx, family hx, and changes were documented in the history of present illness. Otherwise, unchanged from my initial visit note.  ROS: Constitutional: no weight gain, + fatigue, no subjective hyperthermia, + nocturia Eyes: + blurry vision, no xerophthalmia ENT: no sore throat, no nodules palpated in throat, no  dysphagia/odynophagia, no hoarseness Cardiovascular: no CP/SOB/palpitations/+ leg swelling (lymphedema) Respiratory: no cough/SOB Gastrointestinal: no N/V/D/C/heartburn Musculoskeletal: + muscle pain /+ joint aches Skin: + rash R leg (stasis dermatitis) , easy bruising Neurological: no tremors/numbness/tingling/dizziness  PE: BP 118/62 mmHg  Pulse 86  Temp(Src) 98.2 F (36.8 C) (Oral)  Resp 12  Wt 286 lb 9.6 oz (130.001 kg)  SpO2 96% Wt Readings from Last 3 Encounters:  10/23/15 286 lb 9.6 oz (130.001 kg)  08/07/15 293 lb 6.4 oz (133.085 kg)  06/12/15 289 lb (131.09 kg)   Constitutional: obesity type 3, in NAD Eyes: PERRLA, EOMI, no exophthalmos ENT: moist mucous membranes, no thyromegaly, no cervical lymphadenopathy Cardiovascular: RRR, No MRG Respiratory: CTA B Gastrointestinal: abdomen soft, NT, ND, BS+ Musculoskeletal: no deformities, strength intact in all 4 Skin: moist, warm, lymphedema in lower legs  ASSESSMENT: 1. DM2, insulin-dependent, uncontrolled, without complications  PLAN:  1. Patient with long-standing diabetes, returning after more than a year, with worsening diabetes control since last visit. She is not checking sugars at all and her last hemoglobin A1c has been 10.7% 2.5 months ago. Upon questioning, she has stopped Victoza in the past, and tried a weekly GLP-1 receptor agonist(I believe she had Bydureon samples), however, which she could not inject well and stopped after a dose. She did not restart Victoza, though. She cannot remember if there was a problem with the insurance approving Victoza since this happened more  than a year ago. We will try to re-send Victoza and restart this as soon as possible. She is now on the higher dose of Lantus that we will move entirely at bedtime. Since I have no sugar checks, I cannot make further changes today. I advised her to get the ReliOn glucometer and strongly advised her to start checking sugars twice a day. Patient  Instructions  Please change: - Lantus to 35 units at night - Metformin XR 2250 mg (3x750 mg) at dinnertime - Amaryl 4 mg 2x a da, before b'fast and before dinner  Start back Victoza 0.6 mg daily in the morning and increase by 0.6 mg every 5 days until you get to 1.8 mg daily.  Try to get a ReliOn glucometer.  Please return in 1-1.5 months with your sugar log.   - she is up-to-date with eye exams - Refilled Amaryl - I will see the patient back in 1-1.5 months with her sugar

## 2015-10-28 ENCOUNTER — Other Ambulatory Visit: Payer: Self-pay | Admitting: *Deleted

## 2015-10-28 ENCOUNTER — Encounter: Payer: Self-pay | Admitting: Internal Medicine

## 2015-10-28 MED ORDER — GLUCOSE BLOOD VI STRP: ORAL_STRIP | Status: DC

## 2015-11-07 ENCOUNTER — Other Ambulatory Visit: Payer: Self-pay | Admitting: Family

## 2015-11-07 ENCOUNTER — Encounter: Payer: Self-pay | Admitting: Family Medicine

## 2015-11-07 ENCOUNTER — Ambulatory Visit (INDEPENDENT_AMBULATORY_CARE_PROVIDER_SITE_OTHER): Payer: 59 | Admitting: Family Medicine

## 2015-11-07 VITALS — BP 136/76 | HR 68 | Temp 98.4°F | Ht 63.0 in | Wt 286.2 lb

## 2015-11-07 DIAGNOSIS — E785 Hyperlipidemia, unspecified: Secondary | ICD-10-CM

## 2015-11-07 DIAGNOSIS — E1165 Type 2 diabetes mellitus with hyperglycemia: Secondary | ICD-10-CM

## 2015-11-07 DIAGNOSIS — I10 Essential (primary) hypertension: Secondary | ICD-10-CM

## 2015-11-07 DIAGNOSIS — Z794 Long term (current) use of insulin: Secondary | ICD-10-CM

## 2015-11-07 NOTE — Patient Instructions (Signed)
Hypertension Hypertension, commonly called high blood pressure, is when the force of blood pumping through your arteries is too strong. Your arteries are the blood vessels that carry blood from your heart throughout your body. A blood pressure reading consists of a higher number over a lower number, such as 110/72. The higher number (systolic) is the pressure inside your arteries when your heart pumps. The lower number (diastolic) is the pressure inside your arteries when your heart relaxes. Ideally you want your blood pressure below 120/80. Hypertension forces your heart to work harder to pump blood. Your arteries may become narrow or stiff. Having untreated or uncontrolled hypertension can cause heart attack, stroke, kidney disease, and other problems. RISK FACTORS Some risk factors for high blood pressure are controllable. Others are not.  Risk factors you cannot control include:   Race. You may be at higher risk if you are African American.  Age. Risk increases with age.  Gender. Men are at higher risk than women before age 45 years. After age 65, women are at higher risk than men. Risk factors you can control include:  Not getting enough exercise or physical activity.  Being overweight.  Getting too much fat, sugar, calories, or salt in your diet.  Drinking too much alcohol. SIGNS AND SYMPTOMS Hypertension does not usually cause signs or symptoms. Extremely high blood pressure (hypertensive crisis) may cause headache, anxiety, shortness of breath, and nosebleed. DIAGNOSIS To check if you have hypertension, your health care provider will measure your blood pressure while you are seated, with your arm held at the level of your heart. It should be measured at least twice using the same arm. Certain conditions can cause a difference in blood pressure between your right and left arms. A blood pressure reading that is higher than normal on one occasion does not mean that you need treatment. If  it is not clear whether you have high blood pressure, you may be asked to return on a different day to have your blood pressure checked again. Or, you may be asked to monitor your blood pressure at home for 1 or more weeks. TREATMENT Treating high blood pressure includes making lifestyle changes and possibly taking medicine. Living a healthy lifestyle can help lower high blood pressure. You may need to change some of your habits. Lifestyle changes may include:  Following the DASH diet. This diet is high in fruits, vegetables, and whole grains. It is low in salt, red meat, and added sugars.  Keep your sodium intake below 2,300 mg per day.  Getting at least 30-45 minutes of aerobic exercise at least 4 times per week.  Losing weight if necessary.  Not smoking.  Limiting alcoholic beverages.  Learning ways to reduce stress. Your health care provider may prescribe medicine if lifestyle changes are not enough to get your blood pressure under control, and if one of the following is true:  You are 18-59 years of age and your systolic blood pressure is above 140.  You are 60 years of age or older, and your systolic blood pressure is above 150.  Your diastolic blood pressure is above 90.  You have diabetes, and your systolic blood pressure is over 140 or your diastolic blood pressure is over 90.  You have kidney disease and your blood pressure is above 140/90.  You have heart disease and your blood pressure is above 140/90. Your personal target blood pressure may vary depending on your medical conditions, your age, and other factors. HOME CARE INSTRUCTIONS    Have your blood pressure rechecked as directed by your health care provider.   Take medicines only as directed by your health care provider. Follow the directions carefully. Blood pressure medicines must be taken as prescribed. The medicine does not work as well when you skip doses. Skipping doses also puts you at risk for  problems.  Do not smoke.   Monitor your blood pressure at home as directed by your health care provider. SEEK MEDICAL CARE IF:   You think you are having a reaction to medicines taken.  You have recurrent headaches or feel dizzy.  You have swelling in your ankles.  You have trouble with your vision. SEEK IMMEDIATE MEDICAL CARE IF:  You develop a severe headache or confusion.  You have unusual weakness, numbness, or feel faint.  You have severe chest or abdominal pain.  You vomit repeatedly.  You have trouble breathing. MAKE SURE YOU:   Understand these instructions.  Will watch your condition.  Will get help right away if you are not doing well or get worse.   This information is not intended to replace advice given to you by your health care provider. Make sure you discuss any questions you have with your health care provider.   Document Released: 10/18/2005 Document Revised: 03/04/2015 Document Reviewed: 08/10/2013 Elsevier Interactive Patient Education 2016 [employer]  

## 2015-11-07 NOTE — Progress Notes (Signed)
Pre visit review using our clinic review tool, if applicable. No additional management support is needed unless otherwise documented below in the visit note. 

## 2015-11-08 NOTE — Progress Notes (Signed)
Patient ID: Andrea Marks, female    DOB: 1950-11-13  Age: 65 y.o. MRN: [id number]    Subjective:  Subjective HPI Andrea Marks presents for f/u dm, htn and cholesterol.  HYPERTENSION  Blood pressure range-not checking  Chest pain- no      Dyspnea- no Lightheadedness- no   Edema- no Other side effects - no   Medication compliance: good Low salt diet- yes  DIABETES  Blood Sugar ranges-good per pt  Polyuria- no New Visual problems- no Hypoglycemic symptoms- no Other side effects-no Medication compliance - good Last eye exam- recent Foot exam- today  HYPERLIPIDEMIA  Medication compliance- no RUQ pain- no  Muscle aches- no Other side effects-no     Review of Systems Review of Systems  Constitutional: Negative for activity change, appetite change and fatigue.  HENT: Negative for hearing loss, congestion, tinnitus and ear discharge.  dentist q28mEyes: Negative for visual disturbance (see optho q1y -- vision corrected to 20/20 with glasses).  Respiratory: Negative for cough, chest tightness and shortness of breath.   Cardiovascular: Negative for chest pain, palpitations and leg swelling.  Gastrointestinal: Negative for abdominal pain, diarrhea, constipation and abdominal distention.  Genitourinary: Negative for urgency, frequency, decreased urine volume and difficulty urinating.  Musculoskeletal: Negative for back pain, arthralgias and gait problem.  Skin: Negative for color change, pallor and rash.  Neurological: Negative for dizziness, light-headedness, numbness and headaches.  Hematological: Negative for adenopathy. Does not bruise/bleed easily.  Psychiatric/Behavioral: Negative for suicidal ideas, confusion, sleep disturbance, self-injury, dysphoric mood, decreased concentration and agitation.      History Past Medical History  Diagnosis Date  . Depression   . Diabetes mellitus   . GERD (gastroesophageal reflux disease)   . Hyperlipidemia   .  Hypertension   . Obesity     She has past surgical history that includes Abdominal hysterectomy (1991).   Her family history includes Cancer in her maternal aunt and other; Diabetes in her paternal grandmother, paternal uncle, paternal uncle, and paternal uncle; Heart disease in her mother, paternal uncle, paternal uncle, paternal uncle, paternal uncle, and paternal uncle; Lung disease (age of onset: 844 in her father; Macular degeneration in her mother.She reports that she has quit smoking. She has never used smokeless tobacco. She reports that she does not drink alcohol or use illicit drugs.  Current Outpatient Prescriptions on File Prior to Visit  Medication Sig Dispense Refill  . acetaminophen (TYLENOL ARTHRITIS PAIN) 650 MG CR tablet Take 650 mg by mouth 2 (two) times daily as needed. For pain    . aspirin 81 MG tablet Take 81 mg by mouth daily.      . Blood Glucose Monitoring Suppl (ONE TOUCH ULTRA SYSTEM KIT) W/DEVICE KIT 1 kit by Does not apply route once. 1 each 0  . FLUoxetine (PROZAC) 20 MG capsule TAKE 3 CAPSULES DAILY 270 capsule 1  . glimepiride (AMARYL) 4 MG tablet Take 1 tablet (4 mg total) by mouth 2 (two) times daily. 180 tablet 1  . glucose blood (ONE TOUCH ULTRA TEST) test strip Use to test blood sugar 2 times daily as instructed. Dx: E11.65 50 each 0  . LANTUS SOLOSTAR 100 UNIT/ML Solostar Pen INJECT 25 UNITS UNDER THE SKIN AT BEDTIME (Patient taking differently: 35 units at Bedtime) 30 mL 0  . MENEST 1.25 MG TABS Take 2 tablets (2.5 mg total) by mouth every other day. 90 tablet 1  . metFORMIN (GLUCOPHAGE-XR) 750 MG 24 hr tablet TAKE 1 TABLET THREE TIMES A DAY  270 tablet 1  . NOVOFINE 32G X 6 MM MISC USE 1 NEEDLE UNDER THE SKIN DAILY 100 each 1  . ONETOUCH DELICA LANCETS FINE MISC USE AS INSTRUCTED 1 each 9  . ranitidine (ZANTAC) 300 MG capsule Take 1 capsule (300 mg total) by mouth daily. 90 capsule 3   No current facility-administered medications on file prior to visit.       Objective:  Objective Physical Exam  Constitutional: She is oriented to person, place, and time. She appears well-developed and well-nourished.  HENT:  Head: Normocephalic and atraumatic.  Eyes: Conjunctivae and EOM are normal.  Neck: Normal range of motion. Neck supple. No JVD present. Carotid bruit is not present. No thyromegaly present.  Cardiovascular: Normal rate, regular rhythm and normal heart sounds.   No murmur heard. Pulmonary/Chest: Effort normal and breath sounds normal. No respiratory distress. She has no wheezes. She has no rales. She exhibits no tenderness.  Musculoskeletal: She exhibits no edema.  Neurological: She is alert and oriented to person, place, and time.  Psychiatric: She has a normal mood and affect. Her behavior is normal. Judgment and thought content normal.  Nursing note and vitals reviewed. Sensory exam of the foot is normal, tested with the monofilament. Good pulses, no lesions or ulcers, good peripheral pulses.  BP 136/76 mmHg  Pulse 68  Temp(Src) 98.4 F (36.9 C) (Oral)  Ht _0  (1.6 m)  Wt 286 lb 3.2 oz (129.819 kg)  BMI 50.71 kg/m2  SpO2 96% Wt Readings from Last 3 Encounters:  11/07/15 286 lb 3.2 oz (129.819 kg)  10/23/15 286 lb 9.6 oz (130.001 kg)  08/07/15 293 lb 6.4 oz (133.085 kg)     Lab Results  Component Value Date   WBC 11.0* 08/07/2015   HGB 14.5 08/07/2015   HCT 45.3 08/07/2015   PLT 195.0 08/07/2015   GLUCOSE 366* 08/07/2015   CHOL 113 08/07/2015   TRIG 179.0* 08/07/2015   HDL 53.50 08/07/2015   LDLCALC 24 08/07/2015   ALT 15 08/07/2015   AST 27 08/07/2015   NA 134* 08/07/2015   K 4.7 08/07/2015   CL 99 08/07/2015   CREATININE 0.82 08/07/2015   BUN 17 08/07/2015   CO2 26 08/07/2015   TSH 3.22 08/07/2015   INR 1.2 RATIO 04/19/2007   HGBA1C 10.7* 08/07/2015   MICROALBUR 0.9 08/07/2015    No results found.   Assessment & Plan:  Plan I have discontinued [name]'s clotrimazole-betamethasone and  Liraglutide. I have also changed her benazepril. Additionally, I am having her maintain her acetaminophen, aspirin, NOVOFINE, ONE TOUCH ULTRA SYSTEM KIT, ONETOUCH DELICA LANCETS FINE, LANTUS SOLOSTAR, metFORMIN, FLUoxetine, ranitidine, MENEST, glimepiride, glucose blood, VYTORIN, meloxicam, and Dulaglutide.  Meds ordered this encounter  Medications  . meloxicam (MOBIC) 15 MG tablet    Sig: Take 1 tablet by mouth daily as needed.    Refill:  0  . benazepril (LOTENSIN) 20 MG tablet    Sig: Take 1 tablet (20 mg total) by mouth daily.    Dispense:  90 tablet    Refill:  1  . Dulaglutide (TRULICITY) 1.5 HM/0.9OB SOPN    Sig: Inject 1.5 mg into the skin once a week.    Dispense:  4 pen    Refill:  2    Replace Victoza.    Problem List Items Addressed This Visit    Type 2 diabetes mellitus with hyperglycemia, with long-term current use of insulin ([place])    Per endo Pt has f/u  Current outpatient prescriptions:  .  acetaminophen (TYLENOL ARTHRITIS PAIN) 650 MG CR tablet, Take 650 mg by mouth 2 (two) times daily as needed. For pain, Disp: , Rfl:  .  aspirin 81 MG tablet, Take 81 mg by mouth daily.  , Disp: , Rfl:  .  benazepril (LOTENSIN) 20 MG tablet, TAKE 1 TABLET DAILY, Disp: 90 tablet, Rfl: 1 .  Blood Glucose Monitoring Suppl (ONE TOUCH ULTRA SYSTEM KIT) W/DEVICE KIT, 1 kit by Does not apply route once., Disp: 1 each, Rfl: 0 .  Dulaglutide (TRULICITY) 1.5 AJ/2.8NO SOPN, Inject 1.5 mg into the skin once a week., Disp: 4 pen, Rfl: 2 .  FLUoxetine (PROZAC) 20 MG capsule, TAKE 3 CAPSULES DAILY, Disp: 270 capsule, Rfl: 1 .  glimepiride (AMARYL) 4 MG tablet, Take 1 tablet (4 mg total) by mouth 2 (two) times daily., Disp: 180 tablet, Rfl: 1 .  glucose blood (ONE TOUCH ULTRA TEST) test strip, Use to test blood sugar 2 times daily as instructed. Dx: E11.65, Disp: 50 each, Rfl: 0 .  LANTUS SOLOSTAR 100 UNIT/ML Solostar Pen, INJECT 25 UNITS UNDER THE SKIN AT BEDTIME (Patient taking differently: 35  units at Bedtime), Disp: 30 mL, Rfl: 0 .  meloxicam (MOBIC) 15 MG tablet, Take 1 tablet by mouth daily as needed., Disp: , Rfl: 0 .  MENEST 1.25 MG TABS, Take 2 tablets (2.5 mg total) by mouth every other day., Disp: 90 tablet, Rfl: 1 .  metFORMIN (GLUCOPHAGE-XR) 750 MG 24 hr tablet, TAKE 1 TABLET THREE TIMES A DAY, Disp: 270 tablet, Rfl: 1 .  NOVOFINE 32G X 6 MM MISC, USE 1 NEEDLE UNDER THE SKIN DAILY, Disp: 100 each, Rfl: 1 .  ONETOUCH DELICA LANCETS FINE MISC, USE AS INSTRUCTED, Disp: 1 each, Rfl: 9 .  ranitidine (ZANTAC) 300 MG capsule, Take 1 capsule (300 mg total) by mouth daily., Disp: 90 capsule, Rfl: 3 .  VYTORIN 10-40 MG tablet, TAKE 1 TABLET DAILY (DUE FOR APPOINTMENT. NO FURTHER REFILLS), Disp: 90 tablet, Rfl: 0       Relevant Medications   benazepril (LOTENSIN) 20 MG tablet   Dulaglutide (TRULICITY) 1.5 MV/6.7MC SOPN   Hyperlipidemia    con't vytorin Check labs  Lab Results  Component Value Date   CHOL 113 08/07/2015   HDL 53.50 08/07/2015   LDLCALC 24 08/07/2015   TRIG 179.0* 08/07/2015   CHOLHDL 2 08/07/2015         Relevant Medications   benazepril (LOTENSIN) 20 MG tablet   Essential hypertension - Primary    con't benazapril Stable , labs in April      Relevant Medications   benazepril (LOTENSIN) 20 MG tablet      Follow-up: Return in about 3 months (around 02/05/2016), or if symptoms worsen or fail to improve, for annual exam, fasting.  Garnet Koyanagi, DO

## 2015-11-09 ENCOUNTER — Encounter: Payer: Self-pay | Admitting: Internal Medicine

## 2015-11-09 MED ORDER — BENAZEPRIL HCL 20 MG PO TABS: 20.0000 mg | ORAL_TABLET | Freq: Every day | ORAL | Status: DC

## 2015-11-09 MED ORDER — DULAGLUTIDE 1.5 MG/0.5ML ~~LOC~~ SOAJ: 1.5000 mg | SUBCUTANEOUS | Status: DC

## 2015-11-09 NOTE — Assessment & Plan Note (Addendum)
Per endo Pt has f/u  Current outpatient prescriptions:  .  acetaminophen (TYLENOL ARTHRITIS PAIN) 650 MG CR tablet, Take 650 mg by mouth 2 (two) times daily as needed. For pain, Disp: , Rfl:  .  aspirin 81 MG tablet, Take 81 mg by mouth daily.  , Disp: , Rfl:  .  benazepril (LOTENSIN) 20 MG tablet, Take 1 tablet (20 mg total) by mouth daily., Disp: 90 tablet, Rfl: 1 .  Blood Glucose Monitoring Suppl (ONE TOUCH ULTRA SYSTEM KIT) W/DEVICE KIT, 1 kit by Does not apply route once., Disp: 1 each, Rfl: 0 .  Dulaglutide (TRULICITY) 1.5 SW/1.0XN SOPN, Inject 1.5 mg into the skin once a week., Disp: 4 pen, Rfl: 2 .  FLUoxetine (PROZAC) 20 MG capsule, TAKE 3 CAPSULES DAILY, Disp: 270 capsule, Rfl: 1 .  glimepiride (AMARYL) 4 MG tablet, Take 1 tablet (4 mg total) by mouth 2 (two) times daily., Disp: 180 tablet, Rfl: 1 .  glucose blood (ONE TOUCH ULTRA TEST) test strip, Use to test blood sugar 2 times daily as instructed. Dx: E11.65, Disp: 50 each, Rfl: 0 .  LANTUS SOLOSTAR 100 UNIT/ML Solostar Pen, INJECT 25 UNITS UNDER THE SKIN AT BEDTIME (Patient taking differently: 35 units at Bedtime), Disp: 30 mL, Rfl: 0 .  meloxicam (MOBIC) 15 MG tablet, Take 1 tablet by mouth daily as needed., Disp: , Rfl: 0 .  MENEST 1.25 MG TABS, Take 2 tablets (2.5 mg total) by mouth every other day., Disp: 90 tablet, Rfl: 1 .  metFORMIN (GLUCOPHAGE-XR) 750 MG 24 hr tablet, TAKE 1 TABLET THREE TIMES A DAY, Disp: 270 tablet, Rfl: 1 .  NOVOFINE 32G X 6 MM MISC, USE 1 NEEDLE UNDER THE SKIN DAILY, Disp: 100 each, Rfl: 1 .  ONETOUCH DELICA LANCETS FINE MISC, USE AS INSTRUCTED, Disp: 1 each, Rfl: 9 .  ranitidine (ZANTAC) 300 MG capsule, Take 1 capsule (300 mg total) by mouth daily., Disp: 90 capsule, Rfl: 3 .  VYTORIN 10-40 MG tablet, TAKE 1 TABLET DAILY (DUE FOR APPOINTMENT. NO FURTHER REFILLS), Disp: 90 tablet, Rfl: 0

## 2015-11-09 NOTE — Assessment & Plan Note (Signed)
con't vytorin Check labs  Lab Results  Component Value Date   CHOL 113 08/07/2015   HDL 53.50 08/07/2015   LDLCALC 24 08/07/2015   TRIG 179.0* 08/07/2015   CHOLHDL 2 08/07/2015

## 2015-11-09 NOTE — Assessment & Plan Note (Signed)
con't benazapril Stable , labs in April

## 2015-11-17 ENCOUNTER — Encounter: Payer: Self-pay | Admitting: Internal Medicine

## 2015-11-20 ENCOUNTER — Other Ambulatory Visit: Payer: Self-pay | Admitting: Internal Medicine

## 2015-11-20 ENCOUNTER — Encounter: Payer: Self-pay | Admitting: Internal Medicine

## 2015-11-24 ENCOUNTER — Encounter: Payer: Self-pay | Admitting: Internal Medicine

## 2015-11-24 ENCOUNTER — Other Ambulatory Visit: Payer: Self-pay | Admitting: Internal Medicine

## 2015-11-24 MED ORDER — INSULIN ASPART 100 UNIT/ML FLEXPEN: 8.0000 [IU] | PEN_INJECTOR | Freq: Every day | SUBCUTANEOUS | Status: DC

## 2015-11-25 ENCOUNTER — Other Ambulatory Visit: Payer: Self-pay | Admitting: *Deleted

## 2015-11-25 MED ORDER — INSULIN LISPRO 100 UNIT/ML (KWIKPEN)
PEN_INJECTOR | SUBCUTANEOUS | Status: DC
Start: 2015-11-25 — End: 2016-01-05

## 2015-11-25 NOTE — Telephone Encounter (Signed)
Medication change ok per Dr Elvera Lennox. Pt's ins does not cover Novolog.

## 2015-11-26 ENCOUNTER — Ambulatory Visit: Payer: 59 | Admitting: Podiatry

## 2015-11-27 ENCOUNTER — Encounter: Payer: Self-pay | Admitting: Podiatry

## 2015-11-27 ENCOUNTER — Ambulatory Visit (INDEPENDENT_AMBULATORY_CARE_PROVIDER_SITE_OTHER): Payer: 59 | Admitting: Podiatry

## 2015-11-27 DIAGNOSIS — B351 Tinea unguium: Secondary | ICD-10-CM

## 2015-11-27 DIAGNOSIS — M79676 Pain in unspecified toe(s): Secondary | ICD-10-CM | POA: Diagnosis not present

## 2015-11-27 DIAGNOSIS — E1149 Type 2 diabetes mellitus with other diabetic neurological complication: Secondary | ICD-10-CM

## 2015-11-27 DIAGNOSIS — L84 Corns and callosities: Secondary | ICD-10-CM

## 2015-11-27 DIAGNOSIS — Z794 Long term (current) use of insulin: Secondary | ICD-10-CM

## 2015-11-27 DIAGNOSIS — E1151 Type 2 diabetes mellitus with diabetic peripheral angiopathy without gangrene: Secondary | ICD-10-CM

## 2015-11-27 NOTE — Progress Notes (Signed)
Patient ID: Andrea Marks, female   DOB: 04-23-1951, 65 y.o.   MRN: [id number] Complaint:  Visit Type: Patient returns to my office for continued preventative foot care services. Complaint: Patient states" my nails have grown long and thick and become painful to walk and wear shoes".  Painful callus on bottom of both big toes. Patient has been diagnosed with DM with no foot complications. The patient presents for preventative foot care services. No changes to ROS  Podiatric Exam: Vascular: dorsalis pedis and posterior tibial pulses are not palpable bilateral due to foot swelling.. Capillary return is immediate. Temperature gradient is WNL. Skin turgor WNL  Sensorium: Diminished Semmes Weinstein monofilament test. Normal tactile sensation bilaterally. Nail Exam: Pt has thick disfigured discolored nails with subungual debris noted bilateral entire nail hallux through fifth toenails Ulcer Exam: There is no evidence of ulcer or pre-ulcerative changes or infection. Orthopedic Exam: Muscle tone and strength are WNL. No limitations in general ROM. No crepitus or effusions noted. Foot type and digits show no abnormalities. Bony prominences are unremarkable. Skin: No Porokeratosis. No infection or ulcers.  Pinch callus hallux B/L.  Diagnosis:  Onychomycosis, , Pain in right toe, pain in left toes,  Callus B/L  Treatment & Plan Procedures and Treatment: Consent by patient was obtained for treatment procedures. The patient understood the discussion of treatment and procedures well. All questions were answered thoroughly reviewed. Debridement of mycotic and hypertrophic toenails, 1 through 5 bilateral and clearing of subungual debris. No ulceration, no infection noted. Debride callus. Return Visit-Office Procedure: Patient instructed to return to the office for a follow up visit 3 months for continued evaluation and treatment.   Helane Gunther DPM

## 2015-11-30 ENCOUNTER — Encounter: Payer: Self-pay | Admitting: Internal Medicine

## 2015-12-07 ENCOUNTER — Encounter: Payer: Self-pay | Admitting: Internal Medicine

## 2015-12-14 ENCOUNTER — Encounter: Payer: Self-pay | Admitting: Internal Medicine

## 2015-12-22 ENCOUNTER — Other Ambulatory Visit (INDEPENDENT_AMBULATORY_CARE_PROVIDER_SITE_OTHER): Payer: 59 | Admitting: *Deleted

## 2015-12-22 ENCOUNTER — Encounter: Payer: Self-pay | Admitting: Internal Medicine

## 2015-12-22 ENCOUNTER — Ambulatory Visit (INDEPENDENT_AMBULATORY_CARE_PROVIDER_SITE_OTHER): Payer: 59 | Admitting: Internal Medicine

## 2015-12-22 VITALS — BP 118/62 | HR 89 | Temp 98.7°F | Resp 12 | Wt 284.0 lb

## 2015-12-22 DIAGNOSIS — E1165 Type 2 diabetes mellitus with hyperglycemia: Secondary | ICD-10-CM

## 2015-12-22 DIAGNOSIS — Z794 Long term (current) use of insulin: Secondary | ICD-10-CM

## 2015-12-22 LAB — POCT GLYCOSYLATED HEMOGLOBIN (HGB A1C): Hemoglobin A1C: 7.8

## 2015-12-22 MED ORDER — INSULIN GLARGINE 100 UNIT/ML SOLOSTAR PEN
PEN_INJECTOR | SUBCUTANEOUS | Status: DC
Start: 2015-12-22 — End: 2016-06-11

## 2015-12-22 NOTE — Progress Notes (Signed)
Patient ID: Andrea Marks, female   DOB: February 28, 1951, 65 y.o.   MRN: [id number]  HPI: Andrea Marks is a 65 y.o.-year-old female, returning for f/u for DM2, dx 1994, insulin-dependent, uncontrolled, without complications. Last visit was 1 year and 9 mo ago.  Last hemoglobin A1c was: Lab Results  Component Value Date   HGBA1C 10.7* 08/07/2015   HGBA1C 10.1* 07/12/2014   HGBA1C 7.3* 01/11/2014   Pt was on a regimen of: - Lantus 25 units qhs - Metformin XR 2250 mg po hs (3x750 mg) - Amaryl 4 mg bid  Tried Bydureon once a week >> she had problems injecting the medication. She stopped Victoza then and did not restart.  Reviewed latest Phone notes (12/14/2015): Week 5-11  2-5 before breakfast 263, 2 hr after dinner 256  2-6 before breakfast 189, before lunch 195  2-7 before breakfast 97, before lunch 120, before dinner 140  2-8 before breakfast 158,  2-9 before breakfast 227, before lunch 181, before dinner 137, 2 hrs after dinner 178  2-10 before breakfast 144, 2 hrs after lunch 167  2-11 before breakfast 101   FYI: Humalog - I take 10 before breakfast and lunch, 15 before dinner     Lantus - 45 before bed     Trulicity - 1 shot every Sunday morning          Now: - am: 122, 133-184 - 2h after b'fast: 186 - before lunch: 208 - before dinner: 106-136 - after dinner: 133 - bedtime: 194-254  Pt does not have chronic kidney disease, last BUN/creatinine was:  Lab Results  Component Value Date   BUN 17 08/07/2015   CREATININE 0.82 08/07/2015  She is on Benazepril. Last set of lipids: Lab Results  Component Value Date   CHOL 113 08/07/2015   HDL 53.50 08/07/2015   LDLCALC 24 08/07/2015   TRIG 179.0* 08/07/2015   CHOLHDL 2 08/07/2015  On Vytorin. Pt's last eye exam was on 10/2015 (Dr. Nelle Don). No DR.  Denies numbness and tingling in her legs. She sees a Risk analyst - Dr. Stacie Acres.  She had an ulcer in lower right leg (has lymphedema).  She also has a  history of HL, HTN, GERD, depression, h/o thrombophlebitis.  I reviewed pt's medications, allergies, PMH, social hx, family hx, and changes were documented in the history of present illness. Otherwise, unchanged from my initial visit note.  ROS: Constitutional: no weight gain, + fatigue, no subjective hyperthermia,no nocturia Eyes: + blurry vision, no xerophthalmia ENT: no sore throat, no nodules palpated in throat, no dysphagia/odynophagia, no hoarseness Cardiovascular: no CP/SOB/palpitations/+ leg swelling (lymphedema) Respiratory: no cough/SOB Gastrointestinal: no N/V/D/C/heartburn Musculoskeletal: no muscle pain/ joint aches Skin: + rash R leg (stasis dermatitis) , easy bruising Neurological: + tremors/no numbness/tingling/dizziness  PE: BP 118/62 mmHg  Pulse 89  Temp(Src) 98.7 F (37.1 C) (Oral)  Resp 12  Wt 284 lb (128.822 kg)  SpO2 96% Wt Readings from Last 3 Encounters:  12/22/15 284 lb (128.822 kg)  11/07/15 286 lb 3.2 oz (129.819 kg)  10/23/15 286 lb 9.6 oz (130.001 kg)   Constitutional: obesity type 3, in NAD Eyes: PERRLA, EOMI, no exophthalmos ENT: moist mucous membranes, no thyromegaly, no cervical lymphadenopathy Cardiovascular: RRR, No MRG Respiratory: CTA B Gastrointestinal: abdomen soft, NT, ND, BS+ Musculoskeletal: no deformities, strength intact in all 4 Skin: moist, warm, lymphedema in lower legs  ASSESSMENT: 1. DM2, insulin-dependent, uncontrolled, without complications  PLAN:  1. Patient with long-standing diabetes, now on basal-bolus insulin + oral  regimen. Sugars much better after adding Humalog >> will need to increase insulin with dinner as sugars at bedtime still high >> am sugars also high.  Patient Instructions  Please continue: - Trulicity 1.5 mg weekly - Metformin 750 mg x3 at dinnertime - Lantus 45 units at bedtime - Amaryl 4 mg 2x a day  Increase Humalog: - before b'fast: 10 - before lunch: 10 - before dinner: 15 >> 18  Please  return in 1.5 months with your sugar log.   - she is up-to-date with eye exams - check HbA1c today >> 7.8% (much better) - I will see the patient back in 1.5 months with her sugar

## 2015-12-22 NOTE — Patient Instructions (Addendum)
Please continue: - Trulicity 1.5 mg weekly - Metformin 750 mg x3 at dinnertime - Lantus 45 units at bedtime - Amaryl 4 mg 2x a day  Increase Humalog: - before b'fast: 10 - before lunch: 10 - before dinner: 15 >> 18  Please return in 1.5 months with your sugar log.

## 2015-12-29 ENCOUNTER — Encounter: Payer: Self-pay | Admitting: Internal Medicine

## 2016-01-04 ENCOUNTER — Other Ambulatory Visit: Payer: Self-pay | Admitting: Internal Medicine

## 2016-01-05 ENCOUNTER — Encounter: Payer: Self-pay | Admitting: Internal Medicine

## 2016-01-05 MED ORDER — INSULIN LISPRO 100 UNIT/ML (KWIKPEN): PEN_INJECTOR | SUBCUTANEOUS | Status: DC

## 2016-01-09 ENCOUNTER — Other Ambulatory Visit: Payer: Self-pay | Admitting: Internal Medicine

## 2016-01-09 ENCOUNTER — Other Ambulatory Visit: Payer: Self-pay | Admitting: Family Medicine

## 2016-01-12 ENCOUNTER — Other Ambulatory Visit: Payer: Self-pay | Admitting: Family Medicine

## 2016-01-19 ENCOUNTER — Encounter: Payer: Self-pay | Admitting: Internal Medicine

## 2016-01-29 ENCOUNTER — Ambulatory Visit: Payer: 59 | Admitting: Podiatry

## 2016-01-30 ENCOUNTER — Other Ambulatory Visit: Payer: Self-pay | Admitting: Family Medicine

## 2016-02-03 ENCOUNTER — Encounter: Payer: Self-pay | Admitting: Internal Medicine

## 2016-02-04 ENCOUNTER — Encounter: Payer: Self-pay | Admitting: Podiatry

## 2016-02-04 ENCOUNTER — Ambulatory Visit (INDEPENDENT_AMBULATORY_CARE_PROVIDER_SITE_OTHER): Payer: 59 | Admitting: Podiatry

## 2016-02-04 ENCOUNTER — Other Ambulatory Visit: Payer: Self-pay | Admitting: Family Medicine

## 2016-02-04 DIAGNOSIS — L84 Corns and callosities: Secondary | ICD-10-CM | POA: Diagnosis not present

## 2016-02-04 DIAGNOSIS — B351 Tinea unguium: Secondary | ICD-10-CM | POA: Diagnosis not present

## 2016-02-04 DIAGNOSIS — E1149 Type 2 diabetes mellitus with other diabetic neurological complication: Secondary | ICD-10-CM | POA: Diagnosis not present

## 2016-02-04 DIAGNOSIS — M79676 Pain in unspecified toe(s): Secondary | ICD-10-CM | POA: Diagnosis not present

## 2016-02-04 NOTE — Progress Notes (Signed)
Patient ID: Andrea SaleRoberta Marks, female   DOB: 28-Nov-1950, 65 y.o.   MRN: [id number] Complaint:  Visit Type: Patient returns to my office for continued preventative foot care services. Complaint: Patient states" my nails have grown long and thick and become painful to walk and wear shoes".  Painful callus on bottom of both big toes. Patient has been diagnosed with DM with no foot complications. The patient presents for preventative foot care services. No changes to ROS  Podiatric Exam: Vascular: dorsalis pedis and posterior tibial pulses are not palpable bilateral due to foot swelling.. Capillary return is immediate. Temperature gradient is WNL. Skin turgor WNL  Sensorium: Diminished Semmes Weinstein monofilament test. Normal tactile sensation bilaterally. Nail Exam: Pt has thick disfigured discolored nails with subungual debris noted bilateral entire nail hallux through fifth toenails Ulcer Exam: There is no evidence of ulcer or pre-ulcerative changes or infection. Orthopedic Exam: Muscle tone and strength are WNL. No limitations in general ROM. No crepitus or effusions noted. Foot type and digits show no abnormalities. Bony prominences are unremarkable. Skin: No Porokeratosis. No infection or ulcers.  Pinch callus hallux B/L.  Diagnosis:  Onychomycosis, , Pain in right toe, pain in left toes,  Callus B/L  Treatment & Plan Procedures and Treatment: Consent by patient was obtained for treatment procedures. The patient understood the discussion of treatment and procedures well. All questions were answered thoroughly reviewed. Debridement of mycotic and hypertrophic toenails, 1 through 5 bilateral and clearing of subungual debris. No ulceration, no infection noted. Debride callus. Return Visit-Office Procedure: Patient instructed to return to the office for a follow up visit 3 months for continued evaluation and treatment.   Andrea GuntherGregory Clifford Marks DPM

## 2016-02-17 ENCOUNTER — Ambulatory Visit: Payer: 59 | Admitting: Internal Medicine

## 2016-02-24 ENCOUNTER — Ambulatory Visit: Payer: 59 | Admitting: Internal Medicine

## 2016-04-08 ENCOUNTER — Ambulatory Visit: Payer: 59 | Admitting: Podiatry

## 2016-04-14 ENCOUNTER — Ambulatory Visit (INDEPENDENT_AMBULATORY_CARE_PROVIDER_SITE_OTHER): Payer: Medicare Other | Admitting: Podiatry

## 2016-04-14 ENCOUNTER — Encounter: Payer: Self-pay | Admitting: Podiatry

## 2016-04-14 DIAGNOSIS — L84 Corns and callosities: Secondary | ICD-10-CM | POA: Diagnosis not present

## 2016-04-14 DIAGNOSIS — M79676 Pain in unspecified toe(s): Secondary | ICD-10-CM | POA: Diagnosis not present

## 2016-04-14 DIAGNOSIS — E1149 Type 2 diabetes mellitus with other diabetic neurological complication: Secondary | ICD-10-CM | POA: Diagnosis not present

## 2016-04-14 DIAGNOSIS — B351 Tinea unguium: Secondary | ICD-10-CM | POA: Diagnosis not present

## 2016-04-14 NOTE — Progress Notes (Signed)
Patient ID: Andrea Marks, female   DOB: 06/28/1951, 65 y.o.   MRN: [id number] Complaint:  Visit Type: Patient returns to my office for continued preventative foot care services. Complaint: Patient states" my nails have grown long and thick and become painful to walk and wear shoes".  Painful callus on bottom of both big toes. Patient has been diagnosed with DM with no foot complications. The patient presents for preventative foot care services. No changes to ROS  Podiatric Exam: Vascular: dorsalis pedis and posterior tibial pulses are not palpable bilateral due to foot swelling.. Capillary return is immediate. Temperature gradient is WNL. Skin turgor WNL  Sensorium: Diminished Semmes Weinstein monofilament test. Normal tactile sensation bilaterally. Nail Exam: Pt has thick disfigured discolored nails with subungual debris noted bilateral entire nail hallux through fifth toenails Ulcer Exam: There is no evidence of ulcer or pre-ulcerative changes or infection. Orthopedic Exam: Muscle tone and strength are WNL. No limitations in general ROM. No crepitus or effusions noted. Foot type and digits show no abnormalities. Bony prominences are unremarkable. Skin: No Porokeratosis. No infection or ulcers.  Pinch callus hallux B/L.  Diagnosis:  Onychomycosis, , Pain in right toe, pain in left toes,  Callus B/L  Treatment & Plan Procedures and Treatment: Consent by patient was obtained for treatment procedures. The patient understood the discussion of treatment and procedures well. All questions were answered thoroughly reviewed. Debridement of mycotic and hypertrophic toenails, 1 through 5 bilateral and clearing of subungual debris. No ulceration, no infection noted. Debride callus. Return Visit-Office Procedure: Patient instructed to return to the office for a follow up visit 3 months for continued evaluation and treatment.   Andon Villard DPM 

## 2016-04-15 ENCOUNTER — Ambulatory Visit: Payer: Medicare Other | Admitting: Podiatry

## 2016-04-26 ENCOUNTER — Other Ambulatory Visit: Payer: Self-pay | Admitting: Internal Medicine

## 2016-04-30 ENCOUNTER — Other Ambulatory Visit: Payer: Self-pay | Admitting: Family Medicine

## 2016-05-06 ENCOUNTER — Telehealth: Payer: Self-pay | Admitting: Family Medicine

## 2016-05-06 NOTE — Telephone Encounter (Signed)
Please schedule this patient a follow up. DM check/Fasting please advise     KP

## 2016-06-01 NOTE — Telephone Encounter (Signed)
lvm advising patient of message below °

## 2016-06-04 NOTE — Telephone Encounter (Signed)
Patient scheduled for  follow up. DM check/Fasting 06/11/2016 at 11:15am.

## 2016-06-11 ENCOUNTER — Encounter: Payer: Self-pay | Admitting: Family Medicine

## 2016-06-11 ENCOUNTER — Ambulatory Visit (INDEPENDENT_AMBULATORY_CARE_PROVIDER_SITE_OTHER): Payer: Medicare Other | Admitting: Family Medicine

## 2016-06-11 VITALS — BP 138/70 | HR 92 | Temp 98.0°F | Wt 291.4 lb

## 2016-06-11 DIAGNOSIS — E1165 Type 2 diabetes mellitus with hyperglycemia: Secondary | ICD-10-CM

## 2016-06-11 DIAGNOSIS — E1151 Type 2 diabetes mellitus with diabetic peripheral angiopathy without gangrene: Secondary | ICD-10-CM | POA: Diagnosis not present

## 2016-06-11 DIAGNOSIS — I10 Essential (primary) hypertension: Secondary | ICD-10-CM

## 2016-06-11 DIAGNOSIS — E785 Hyperlipidemia, unspecified: Secondary | ICD-10-CM

## 2016-06-11 DIAGNOSIS — Z794 Long term (current) use of insulin: Secondary | ICD-10-CM

## 2016-06-11 DIAGNOSIS — K219 Gastro-esophageal reflux disease without esophagitis: Secondary | ICD-10-CM

## 2016-06-11 DIAGNOSIS — F32A Depression, unspecified: Secondary | ICD-10-CM

## 2016-06-11 DIAGNOSIS — IMO0002 Reserved for concepts with insufficient information to code with codable children: Secondary | ICD-10-CM

## 2016-06-11 DIAGNOSIS — F329 Major depressive disorder, single episode, unspecified: Secondary | ICD-10-CM | POA: Diagnosis not present

## 2016-06-11 LAB — CBC WITH DIFFERENTIAL/PLATELET
Basophils Absolute: 0.1 10*3/uL (ref 0.0–0.1)
Basophils Relative: 1.1 % (ref 0.0–3.0)
Eosinophils Absolute: 0.2 10*3/uL (ref 0.0–0.7)
Eosinophils Relative: 1.7 % (ref 0.0–5.0)
HCT: 42.9 % (ref 36.0–46.0)
Hemoglobin: 14.1 g/dL (ref 12.0–15.0)
Lymphocytes Relative: 18.3 % (ref 12.0–46.0)
Lymphs Abs: 2.1 10*3/uL (ref 0.7–4.0)
MCHC: 33 g/dL (ref 30.0–36.0)
MCV: 83.9 fl (ref 78.0–100.0)
Monocytes Absolute: 0.9 10*3/uL (ref 0.1–1.0)
Monocytes Relative: 7.5 % (ref 3.0–12.0)
Neutro Abs: 8.3 10*3/uL — ABNORMAL HIGH (ref 1.4–7.7)
Neutrophils Relative %: 71.4 % (ref 43.0–77.0)
Platelets: 238 10*3/uL (ref 150.0–400.0)
RBC: 5.11 Mil/uL (ref 3.87–5.11)
RDW: 14.8 % (ref 11.5–15.5)
WBC: 11.6 10*3/uL — ABNORMAL HIGH (ref 4.0–10.5)

## 2016-06-11 LAB — POCT URINALYSIS DIPSTICK
Blood, UA: NEGATIVE
Glucose, UA: NEGATIVE
Ketones, UA: NEGATIVE
Leukocytes, UA: NEGATIVE
Nitrite, UA: NEGATIVE
Spec Grav, UA: >=1.03
Urobilinogen, UA: 0.2
pH, UA: 5

## 2016-06-11 LAB — LIPID PANEL
Cholesterol: 165 mg/dL (ref 0–200)
HDL: 67.4 mg/dL (ref >39.00)
LDL Cholesterol: 62 mg/dL (ref 0–99)
NonHDL: 97.27
Total CHOL/HDL Ratio: 2
Triglycerides: 175 mg/dL — ABNORMAL HIGH (ref 0.0–149.0)
VLDL: 35 mg/dL (ref 0.0–40.0)

## 2016-06-11 LAB — COMPREHENSIVE METABOLIC PANEL
ALT: 18 U/L (ref 0–35)
AST: 24 U/L (ref 0–37)
Albumin: 3.8 g/dL (ref 3.5–5.2)
Alkaline Phosphatase: 51 U/L (ref 39–117)
BUN: 13 mg/dL (ref 6–23)
CO2: 30 mEq/L (ref 19–32)
Calcium: 9.3 mg/dL (ref 8.4–10.5)
Chloride: 101 mEq/L (ref 96–112)
Creatinine, Ser: 0.72 mg/dL (ref 0.40–1.20)
GFR: 86.32 mL/min (ref >60.00)
Glucose, Bld: 197 mg/dL — ABNORMAL HIGH (ref 70–99)
Potassium: 3.8 mEq/L (ref 3.5–5.1)
Sodium: 139 mEq/L (ref 135–145)
Total Bilirubin: 0.5 mg/dL (ref 0.2–1.2)
Total Protein: 7 g/dL (ref 6.0–8.3)

## 2016-06-11 LAB — HEMOGLOBIN A1C: Hgb A1c MFr Bld: 7.8 % — ABNORMAL HIGH (ref 4.6–6.5)

## 2016-06-11 MED ORDER — METFORMIN HCL ER 750 MG PO TB24: 750.0000 mg | ORAL_TABLET | Freq: Three times a day (TID) | ORAL | 1 refills | Status: DC

## 2016-06-11 MED ORDER — INSULIN GLARGINE 100 UNIT/ML SOLOSTAR PEN: PEN_INJECTOR | SUBCUTANEOUS | 1 refills | Status: DC

## 2016-06-11 MED ORDER — EZETIMIBE-SIMVASTATIN 10-40 MG PO TABS: ORAL_TABLET | ORAL | 0 refills | Status: DC

## 2016-06-11 MED ORDER — FLUOXETINE HCL 20 MG PO CAPS: 60.0000 mg | ORAL_CAPSULE | Freq: Every day | ORAL | 0 refills | Status: DC

## 2016-06-11 MED ORDER — INSULIN LISPRO 100 UNIT/ML (KWIKPEN): PEN_INJECTOR | SUBCUTANEOUS | 1 refills | Status: DC

## 2016-06-11 MED ORDER — GLIMEPIRIDE 4 MG PO TABS: 4.0000 mg | ORAL_TABLET | Freq: Two times a day (BID) | ORAL | 0 refills | Status: DC

## 2016-06-11 MED ORDER — DULAGLUTIDE 1.5 MG/0.5ML ~~LOC~~ SOAJ: SUBCUTANEOUS | 1 refills | Status: DC

## 2016-06-11 MED ORDER — RANITIDINE HCL 300 MG PO CAPS: 300.0000 mg | ORAL_CAPSULE | Freq: Every day | ORAL | 3 refills | Status: DC

## 2016-06-11 NOTE — Assessment & Plan Note (Signed)
Stable con't meds 

## 2016-06-11 NOTE — Assessment & Plan Note (Signed)
con't meds  Check labs 

## 2016-06-11 NOTE — Progress Notes (Signed)
Pre visit review using our clinic review tool, if applicable. No additional management support is needed unless otherwise documented below in the visit note. 

## 2016-06-11 NOTE — Progress Notes (Signed)
Patient ID: Andrea Marks, female    DOB: [birth year]  Age: 65 y.o. MRN: [id number]    Subjective:  Subjective  HPI Andrea Marks presents for f/u dm, cholesterol and htn  HPI HYPERTENSION   Blood pressure range-not checking   Chest pain- no      Dyspnea- no Lightheadedness- no   Edema- yes--no worse  Other side effects - no   Medication compliance: good Low salt diet- yes    DIABETES    Blood Sugar ranges-high  Polyuria- no New Visual problems- no  Hypoglycemic symptoms- no  Other side effects-no Medication compliance - poor because she can not remember to take it Last eye exam- 05/2016 Foot exam- endo- podiatry   HYPERLIPIDEMIA  Medication compliance- good RUQ pain- no  Muscle aches- no Other side effects-no   Review of Systems  Constitutional: Negative for activity change, appetite change, diaphoresis, fatigue and unexpected weight change.  Eyes: Negative for pain, redness and visual disturbance.  Respiratory: Negative for cough, chest tightness, shortness of breath and wheezing.   Cardiovascular: Negative for chest pain, palpitations and leg swelling.  Endocrine: Negative for cold intolerance, heat intolerance, polydipsia, polyphagia and polyuria.  Genitourinary: Negative for difficulty urinating, dysuria and frequency.  Neurological: Negative for dizziness, light-headedness, numbness and headaches.  Psychiatric/Behavioral: Negative for behavioral problems and dysphoric mood. The patient is not nervous/anxious.     History Past Medical History:  Diagnosis Date  . Depression   . Diabetes mellitus   . GERD (gastroesophageal reflux disease)   . Hyperlipidemia   . Hypertension   . Obesity     She has a past surgical history that includes Abdominal hysterectomy (1991).   Her family history includes Cancer in her maternal aunt and other; Diabetes in her paternal grandmother, paternal uncle, paternal uncle, and paternal uncle; Heart disease in her  mother, paternal uncle, paternal uncle, paternal uncle, paternal uncle, and paternal uncle; Lung disease (age of onset: 40) in her father; Macular degeneration in her mother.She reports that she has quit smoking. She has never used smokeless tobacco. She reports that she does not drink alcohol or use drugs.  Current Outpatient Prescriptions on File Prior to Visit  Medication Sig Dispense Refill  . acetaminophen (TYLENOL ARTHRITIS PAIN) 650 MG CR tablet Take 650 mg by mouth 2 (two) times daily as needed. For pain    . aspirin 81 MG tablet Take 81 mg by mouth daily.      . benazepril (LOTENSIN) 20 MG tablet TAKE 1 TABLET DAILY 90 tablet 1  . Blood Glucose Monitoring Suppl (ONE TOUCH ULTRA SYSTEM KIT) W/DEVICE KIT 1 kit by Does not apply route once. 1 each 0  . meloxicam (MOBIC) 15 MG tablet Take 1 tablet by mouth daily as needed.  0  . MENEST 1.25 MG TABS TAKE 2 TABLETS EVERY OTHER DAY 90 tablet 1  . NOVOFINE 32G X 6 MM MISC USE 1 NEEDLE UNDER THE SKIN DAILY 100 each 1  . ONE TOUCH ULTRA TEST test strip USE TO TEST BLOOD SUGAR 2 TIMES DAILY AS INSTRUCTED. DX: E11.65 50 each 3  . ONE TOUCH ULTRA TEST test strip USE TO TEST BLOOD SUGAR TWO TIMES DAILY AS INSTRUCTED 200 each 0  . ONETOUCH DELICA LANCETS FINE MISC USE AS INSTRUCTED 1 each 9   No current facility-administered medications on file prior to visit.      Objective:  Objective  Physical Exam  Constitutional: She is oriented to person, place, and time. She appears well-developed  and well-nourished.  HENT:  Head: Normocephalic and atraumatic.  Eyes: Conjunctivae and EOM are normal.  Neck: Normal range of motion. Neck supple. No JVD present. Carotid bruit is not present. No thyromegaly present.  Cardiovascular: Normal rate and regular rhythm.   Murmur heard. Pulmonary/Chest: Effort normal and breath sounds normal. No respiratory distress. She has no wheezes. She has no rales. She exhibits no tenderness.  Musculoskeletal: She exhibits no  edema.  Neurological: She is alert and oriented to person, place, and time.  Psychiatric: She has a normal mood and affect. Her behavior is normal. Judgment and thought content normal.  Nursing note and vitals reviewed.  BP 138/70 (BP Location: Right Arm, Patient Position: Sitting, Cuff Size: Large)   Pulse 92   Temp 98 F (36.7 C) (Oral)   Wt 291 lb 6.4 oz (132.2 kg)   SpO2 92%   BMI 51.62 kg/m  Wt Readings from Last 3 Encounters:  06/11/16 291 lb 6.4 oz (132.2 kg)  12/22/15 284 lb (128.8 kg)  11/07/15 286 lb 3.2 oz (129.8 kg)     Lab Results  Component Value Date   WBC 11.6 (H) 06/11/2016   HGB 14.1 06/11/2016   HCT 42.9 06/11/2016   PLT 238.0 06/11/2016   GLUCOSE 197 (H) 06/11/2016   CHOL 165 06/11/2016   TRIG 175.0 (H) 06/11/2016   HDL 67.40 06/11/2016   LDLCALC 62 06/11/2016   ALT 18 06/11/2016   AST 24 06/11/2016   NA 139 06/11/2016   K 3.8 06/11/2016   CL 101 06/11/2016   CREATININE 0.72 06/11/2016   BUN 13 06/11/2016   CO2 30 06/11/2016   TSH 3.22 08/07/2015   INR 1.2 RATIO 04/19/2007   HGBA1C 7.8 (H) 06/11/2016   MICROALBUR 0.9 08/07/2015    No results found.   Assessment & Plan:  Plan  I have discontinued Andrea Marks's Dulaglutide. I have changed her VYTORIN to ezetimibe-simvastatin and TRULICITY to Dulaglutide. I have also changed her metFORMIN, glimepiride, and FLUoxetine. Additionally, I am having her maintain her acetaminophen, aspirin, NOVOFINE, ONE TOUCH ULTRA SYSTEM KIT, ONETOUCH DELICA LANCETS FINE, meloxicam, ONE TOUCH ULTRA TEST, benazepril, MENEST, ONE TOUCH ULTRA TEST, LUTEIN VISION BLEND, Bioflavonoid Products (ESTER C PO), ranitidine, insulin lispro, and Insulin Glargine.  Meds ordered this encounter  Medications  . Misc Natural Products (LUTEIN VISION BLEND) CAPS    Sig: Take 1 capsule by mouth daily.  Marland Kitchen Bioflavonoid Products (ESTER C PO)    Sig: Take 500 mg by mouth daily.  Marland Kitchen ezetimibe-simvastatin (VYTORIN) 10-40 MG tablet    Sig:  TAKE 1 TABLET AT BEDTIME (REPEAT LABS ARE DUE NOW)    Dispense:  90 tablet    Refill:  0  . Dulaglutide (TRULICITY) 1.5 OF/7.5ZW SOPN    Sig: INJECT 1.5 MG INTO THE SKIN ONCE A WEEK (REPLACES THE VICTOZA)    Dispense:  12 pen    Refill:  1  . ranitidine (ZANTAC) 300 MG capsule    Sig: Take 1 capsule (300 mg total) by mouth daily.    Dispense:  90 capsule    Refill:  3  . metFORMIN (GLUCOPHAGE-XR) 750 MG 24 hr tablet    Sig: Take 1 tablet (750 mg total) by mouth 3 (three) times daily.    Dispense:  270 tablet    Refill:  1  . insulin lispro (HUMALOG KWIKPEN) 100 UNIT/ML KiwkPen    Sig: Inject 10 units before breakfast, 10 units before lunch and 18 units before supper daily.  Dispense:  45 mL    Refill:  1  . Insulin Glargine (LANTUS SOLOSTAR) 100 UNIT/ML Solostar Pen    Sig: 45 units at Bedtime    Dispense:  30 mL    Refill:  1  . glimepiride (AMARYL) 4 MG tablet    Sig: Take 1 tablet (4 mg total) by mouth 2 (two) times daily.    Dispense:  180 tablet    Refill:  0    Patient needs appointment for further refills.  Marland Kitchen FLUoxetine (PROZAC) 20 MG capsule    Sig: Take 3 capsules (60 mg total) by mouth daily.    Dispense:  270 capsule    Refill:  0    Problem List Items Addressed This Visit      Unprioritized   GERD   Relevant Medications   ranitidine (ZANTAC) 300 MG capsule   Essential hypertension    Stable con't meds      Relevant Medications   ezetimibe-simvastatin (VYTORIN) 10-40 MG tablet   Other Relevant Orders   Comprehensive metabolic panel (Completed)   CBC with Differential/Platelet (Completed)   Hemoglobin A1c (Completed)   Hyperlipidemia    con't meds Check labs      Relevant Medications   ezetimibe-simvastatin (VYTORIN) 10-40 MG tablet   Type 2 diabetes mellitus with hyperglycemia, with long-term current use of insulin (HCC)    Stable con't meds      Relevant Medications   Dulaglutide (TRULICITY) 1.5 KC/1.2XN SOPN   metFORMIN (GLUCOPHAGE-XR)  750 MG 24 hr tablet   insulin lispro (HUMALOG KWIKPEN) 100 UNIT/ML KiwkPen   Insulin Glargine (LANTUS SOLOSTAR) 100 UNIT/ML Solostar Pen   glimepiride (AMARYL) 4 MG tablet    Other Visit Diagnoses    DM (diabetes mellitus) type II uncontrolled, periph vascular disorder (HCC)    -  Primary   Relevant Medications   ezetimibe-simvastatin (VYTORIN) 10-40 MG tablet   Dulaglutide (TRULICITY) 1.5 TZ/0.0FV SOPN   metFORMIN (GLUCOPHAGE-XR) 750 MG 24 hr tablet   insulin lispro (HUMALOG KWIKPEN) 100 UNIT/ML KiwkPen   Insulin Glargine (LANTUS SOLOSTAR) 100 UNIT/ML Solostar Pen   glimepiride (AMARYL) 4 MG tablet   Other Relevant Orders   CBC with Differential/Platelet (Completed)   POCT urinalysis dipstick (Completed)   POCT glycosylated hemoglobin (Hb A1C)   Hemoglobin A1c (Completed)   Hyperlipidemia LDL goal <70       Relevant Medications   ezetimibe-simvastatin (VYTORIN) 10-40 MG tablet   Other Relevant Orders   Comprehensive metabolic panel (Completed)   Lipid panel (Completed)   CBC with Differential/Platelet (Completed)   POCT urinalysis dipstick (Completed)   Hemoglobin A1c (Completed)   Depression       Relevant Medications   FLUoxetine (PROZAC) 20 MG capsule      Follow-up: No Follow-up on file.  [name], DO

## 2016-06-11 NOTE — Patient Instructions (Signed)

## 2016-06-17 ENCOUNTER — Ambulatory Visit: Payer: Medicare Other | Admitting: Podiatry

## 2016-06-17 ENCOUNTER — Other Ambulatory Visit: Payer: Self-pay | Admitting: Internal Medicine

## 2016-06-18 ENCOUNTER — Other Ambulatory Visit: Payer: Self-pay | Admitting: Internal Medicine

## 2016-06-22 ENCOUNTER — Other Ambulatory Visit: Payer: Self-pay

## 2016-06-22 MED ORDER — FENOFIBRATE 160 MG PO TABS: 160.0000 mg | ORAL_TABLET | Freq: Every day | ORAL | 1 refills | Status: DC

## 2016-06-22 NOTE — Telephone Encounter (Signed)
-----   Message from Donato SchultzYvonne R Lowne Chase, DO sent at 06/17/2016  9:20 PM EDT ----- Cholesterol--- LDL goal < 70,  HDL >40,  TG < 150.  Diet and exercise will increase HDL and decrease LDL and TG.  Fish,  Fish Oil, Flaxseed oil will also help increase the HDL and decrease Triglycerides.   Recheck labs in 3 months---  Add fenofibrate 160 mg #30  1 po qd , 2 refills  cbcd ----  Wbc elevated--  Any signs of infection, UTI, URI?  Recheck in 3 months as well

## 2016-06-22 NOTE — Telephone Encounter (Signed)
There is a reaction between the Fenofibrate and the Vytorin, please advise if it ok to send.    KP

## 2016-06-22 NOTE — Telephone Encounter (Signed)
Ok to send

## 2016-07-08 ENCOUNTER — Ambulatory Visit (INDEPENDENT_AMBULATORY_CARE_PROVIDER_SITE_OTHER): Payer: Medicare Other | Admitting: Podiatry

## 2016-07-08 ENCOUNTER — Encounter: Payer: Self-pay | Admitting: Podiatry

## 2016-07-08 DIAGNOSIS — M79676 Pain in unspecified toe(s): Secondary | ICD-10-CM | POA: Diagnosis not present

## 2016-07-08 DIAGNOSIS — B351 Tinea unguium: Secondary | ICD-10-CM

## 2016-07-08 DIAGNOSIS — E1149 Type 2 diabetes mellitus with other diabetic neurological complication: Secondary | ICD-10-CM | POA: Diagnosis not present

## 2016-07-08 DIAGNOSIS — L84 Corns and callosities: Secondary | ICD-10-CM

## 2016-07-08 NOTE — Progress Notes (Signed)
Patient ID: Andrea Marks, female   DOB: 08/30/1951, 65 y.o.   MRN: [id number] Complaint:  Visit Type: Patient returns to my office for continued preventative foot care services. Complaint: Patient states" my nails have grown long and thick and become painful to walk and wear shoes".  Painful callus on bottom of both big toes. Patient has been diagnosed with DM with no foot complications. The patient presents for preventative foot care services. No changes to ROS  Podiatric Exam: Vascular: dorsalis pedis and posterior tibial pulses are not palpable bilateral due to foot swelling.. Capillary return is immediate. Temperature gradient is WNL. Skin turgor WNL  Sensorium: Diminished Semmes Weinstein monofilament test. Normal tactile sensation bilaterally. Nail Exam: Pt has thick disfigured discolored nails with subungual debris noted bilateral entire nail hallux through fifth toenails Ulcer Exam: There is no evidence of ulcer or pre-ulcerative changes or infection. Orthopedic Exam: Muscle tone and strength are WNL. No limitations in general ROM. No crepitus or effusions noted. Foot type and digits show no abnormalities. Bony prominences are unremarkable. Skin: No Porokeratosis. No infection or ulcers.  Pinch callus hallux B/L.  Diagnosis:  Onychomycosis, , Pain in right toe, pain in left toes,  Callus B/L  Treatment & Plan Procedures and Treatment: Consent by patient was obtained for treatment procedures. The patient understood the discussion of treatment and procedures well. All questions were answered thoroughly reviewed. Debridement of mycotic and hypertrophic toenails, 1 through 5 bilateral and clearing of subungual debris. No ulceration, no infection noted. Debride callus. Return Visit-Office Procedure: Patient instructed to return to the office for a follow up visit 3 months for continued evaluation and treatment.   Taeler Winning DPM 

## 2016-07-10 ENCOUNTER — Other Ambulatory Visit: Payer: Self-pay | Admitting: Internal Medicine

## 2016-07-10 ENCOUNTER — Other Ambulatory Visit: Payer: Self-pay | Admitting: Family Medicine

## 2016-07-10 DIAGNOSIS — IMO0002 Reserved for concepts with insufficient information to code with codable children: Secondary | ICD-10-CM

## 2016-07-10 DIAGNOSIS — E1165 Type 2 diabetes mellitus with hyperglycemia: Principal | ICD-10-CM

## 2016-07-10 DIAGNOSIS — E1151 Type 2 diabetes mellitus with diabetic peripheral angiopathy without gangrene: Secondary | ICD-10-CM

## 2016-07-25 ENCOUNTER — Other Ambulatory Visit: Payer: Self-pay | Admitting: Internal Medicine

## 2016-07-26 NOTE — Telephone Encounter (Signed)
Andrea Marks

## 2016-07-26 NOTE — Telephone Encounter (Signed)
Jenel LucksRoberta Bonello is requesting refill on one touch ultra strips her last office visit was 12/22/15  has cancelled every time since then and has no future appointment should I refill? Please advise

## 2016-07-27 ENCOUNTER — Other Ambulatory Visit: Payer: Self-pay | Admitting: Internal Medicine

## 2016-07-29 ENCOUNTER — Encounter: Payer: Self-pay | Admitting: Family Medicine

## 2016-07-29 ENCOUNTER — Ambulatory Visit: Payer: Medicare Other | Admitting: Family Medicine

## 2016-07-29 ENCOUNTER — Telehealth: Payer: Self-pay | Admitting: Family Medicine

## 2016-07-29 DIAGNOSIS — Z0289 Encounter for other administrative examinations: Secondary | ICD-10-CM

## 2016-07-29 NOTE — Telephone Encounter (Signed)
charge 

## 2016-07-29 NOTE — Telephone Encounter (Signed)
Pt lvm at 8:03 canceling her appt. Pt didn't give a reason for missing her appt.

## 2016-07-30 ENCOUNTER — Encounter: Payer: Self-pay | Admitting: Family Medicine

## 2016-08-02 ENCOUNTER — Other Ambulatory Visit: Payer: Self-pay | Admitting: Family Medicine

## 2016-08-06 ENCOUNTER — Other Ambulatory Visit: Payer: Self-pay | Admitting: Family Medicine

## 2016-08-27 ENCOUNTER — Encounter: Payer: Self-pay | Admitting: Family Medicine

## 2016-08-27 ENCOUNTER — Telehealth: Payer: Self-pay | Admitting: Family Medicine

## 2016-08-27 ENCOUNTER — Encounter: Payer: Self-pay | Admitting: Internal Medicine

## 2016-08-27 ENCOUNTER — Ambulatory Visit (INDEPENDENT_AMBULATORY_CARE_PROVIDER_SITE_OTHER): Payer: Medicare Other | Admitting: Family Medicine

## 2016-08-27 ENCOUNTER — Other Ambulatory Visit: Payer: Self-pay

## 2016-08-27 VITALS — BP 160/58 | HR 80 | Temp 98.2°F | Resp 17 | Ht 63.0 in | Wt 289.0 lb

## 2016-08-27 DIAGNOSIS — E1165 Type 2 diabetes mellitus with hyperglycemia: Principal | ICD-10-CM

## 2016-08-27 DIAGNOSIS — Z23 Encounter for immunization: Secondary | ICD-10-CM

## 2016-08-27 DIAGNOSIS — E1151 Type 2 diabetes mellitus with diabetic peripheral angiopathy without gangrene: Secondary | ICD-10-CM

## 2016-08-27 DIAGNOSIS — IMO0002 Reserved for concepts with insufficient information to code with codable children: Secondary | ICD-10-CM

## 2016-08-27 DIAGNOSIS — L03115 Cellulitis of right lower limb: Secondary | ICD-10-CM

## 2016-08-27 MED ORDER — DOXYCYCLINE HYCLATE 100 MG PO TABS: 100.0000 mg | ORAL_TABLET | Freq: Two times a day (BID) | ORAL | 0 refills | Status: DC

## 2016-08-27 MED ORDER — DULAGLUTIDE 1.5 MG/0.5ML ~~LOC~~ SOAJ: SUBCUTANEOUS | 1 refills | Status: DC

## 2016-08-27 NOTE — Patient Instructions (Signed)

## 2016-08-27 NOTE — Telephone Encounter (Signed)
Spoke with patient and verified her appointment is 09/13/16 @ 10am. Patient states she understands.. Patient had no further questions or concerns.

## 2016-08-27 NOTE — Telephone Encounter (Signed)
Patient seen on 08/27/16 and needed a 2 week follow up appt per Dr. Laury AxonLowne. The next available follow up slot is in Dec. I put her in an acute slot on 09/13/16. Please advise if appointment needs to be changed.

## 2016-08-27 NOTE — Progress Notes (Signed)
Patient ID: Andrea Marks, female    DOB: 01-10-1951  Age: 65 y.o. MRN: [id number]    Subjective:  Subjective  HPI Fatimata Talsma presents for sore on R low ext ---x 1 month  Review of Systems  Constitutional: Negative for appetite change, diaphoresis, fatigue and unexpected weight change.  Eyes: Negative for pain, redness and visual disturbance.  Respiratory: Negative for cough, chest tightness, shortness of breath and wheezing.   Cardiovascular: Negative for chest pain, palpitations and leg swelling.  Endocrine: Negative for cold intolerance, heat intolerance, polydipsia, polyphagia and polyuria.  Genitourinary: Negative for difficulty urinating, dysuria and frequency.  Skin: Positive for wound. Negative for rash.  Neurological: Negative for dizziness, light-headedness, numbness and headaches.    History Past Medical History:  Diagnosis Date  . Depression   . Diabetes mellitus   . GERD (gastroesophageal reflux disease)   . Hyperlipidemia   . Hypertension   . Obesity     She has a past surgical history that includes Abdominal hysterectomy (1991).   Her family history includes Cancer in her maternal aunt and other; Diabetes in her paternal grandmother, paternal uncle, paternal uncle, and paternal uncle; Heart disease in her mother, paternal uncle, paternal uncle, paternal uncle, paternal uncle, and paternal uncle; Lung disease (age of onset: 35) in her father; Macular degeneration in her mother.She reports that she has quit smoking. She has never used smokeless tobacco. She reports that she does not drink alcohol or use drugs.  Current Outpatient Prescriptions on File Prior to Visit  Medication Sig Dispense Refill  . acetaminophen (TYLENOL ARTHRITIS PAIN) 650 MG CR tablet Take 650 mg by mouth 2 (two) times daily as needed. For pain    . aspirin 81 MG tablet Take 81 mg by mouth daily.      . benazepril (LOTENSIN) 20 MG tablet TAKE 1 TABLET DAILY 90 tablet 1  .  Bioflavonoid Products (ESTER C PO) Take 500 mg by mouth daily.    . Blood Glucose Monitoring Suppl (ONE TOUCH ULTRA SYSTEM KIT) W/DEVICE KIT 1 kit by Does not apply route once. 1 each 0  . ezetimibe-simvastatin (VYTORIN) 10-40 MG tablet TAKE 1 TABLET AT BEDTIME (REPEAT LABS ARE DUE NOW) 90 tablet 0  . fenofibrate 160 MG tablet Take 1 tablet (160 mg total) by mouth daily. 90 tablet 1  . FLUoxetine (PROZAC) 20 MG capsule TAKE 3 CAPSULES DAILY 270 capsule 0  . glimepiride (AMARYL) 4 MG tablet Take 1 tablet (4 mg total) by mouth 2 (two) times daily. 180 tablet 0  . glimepiride (AMARYL) 4 MG tablet TAKE 1 TABLET TWICE A DAY (NEED APPOINTMENT FOR FURTHER REFILLS) 180 tablet 0  . HUMALOG KWIKPEN 100 UNIT/ML KiwkPen INJECT 10 UNITS BEFORE BREAKFAST, 10 UNITS BEFORE LUNCH AND 18 UNITS BEFORE SUPPER DAILY 45 mL 0  . Insulin Glargine (LANTUS SOLOSTAR) 100 UNIT/ML Solostar Pen 45 units at Bedtime 30 mL 1  . insulin lispro (HUMALOG KWIKPEN) 100 UNIT/ML KiwkPen Inject 10 units before breakfast, 10 units before lunch and 18 units before supper daily. 45 mL 1  . meloxicam (MOBIC) 15 MG tablet Take 1 tablet by mouth daily as needed.  0  . MENEST 1.25 MG TABS TAKE 2 TABLETS EVERY OTHER DAY 90 tablet 1  . metFORMIN (GLUCOPHAGE-XR) 750 MG 24 hr tablet Take 1 tablet (750 mg total) by mouth 3 (three) times daily. 270 tablet 1  . Misc Natural Products (LUTEIN VISION BLEND) CAPS Take 1 capsule by mouth daily.    Marland Kitchen  NOVOFINE 32G X 6 MM MISC USE 1 NEEDLE UNDER THE SKIN DAILY 100 each 1  . ONE TOUCH ULTRA TEST test strip USE TO TEST BLOOD SUGAR 2 TIMES DAILY AS INSTRUCTED. DX: E11.65 50 each 3  . ONE TOUCH ULTRA TEST test strip USE TO TEST BLOOD SUGAR TWO TIMES DAILY AS INSTRUCTED 200 each 0  . ONETOUCH DELICA LANCETS FINE MISC USE AS INSTRUCTED 1 each 9  . ranitidine (ZANTAC) 300 MG capsule TAKE 1 CAPSULE DAILY 90 capsule 3  . Dulaglutide (TRULICITY) 1.5 MG/0.5ML SOPN INJECT 1.5 MG INTO THE SKIN ONCE A WEEK (REPLACES THE  VICTOZA) (Patient not taking: Reported on 08/27/2016) 12 pen 1   No current facility-administered medications on file prior to visit.      Objective:  Objective  Physical Exam  Constitutional: She is oriented to person, place, and time. She appears well-developed and well-nourished.  HENT:  Head: Normocephalic and atraumatic.  Eyes: Conjunctivae and EOM are normal.  Neck: Normal range of motion. Neck supple. No JVD present. Carotid bruit is not present. No thyromegaly present.  Cardiovascular: Normal rate, regular rhythm and normal heart sounds.   No murmur heard. Pulmonary/Chest: Effort normal and breath sounds normal. No respiratory distress. She has no wheezes. She has no rales. She exhibits no tenderness.  Musculoskeletal: She exhibits no edema.  Neurological: She is alert and oriented to person, place, and time.  Skin: There is erythema.     Psychiatric: She has a normal mood and affect.  Nursing note and vitals reviewed.  BP (!) 160/58 (BP Location: Right Arm, Patient Position: Sitting, Cuff Size: Large)   Pulse 80   Temp 98.2 F (36.8 C) (Oral)   Resp 17   Ht 5\' 3"  (1.6 m)   Wt 289 lb (131.1 kg)   SpO2 95%   BMI 51.19 kg/m  Wt Readings from Last 3 Encounters:  08/27/16 289 lb (131.1 kg)  06/11/16 291 lb 6.4 oz (132.2 kg)  12/22/15 284 lb (128.8 kg)     Lab Results  Component Value Date   WBC 11.6 (H) 06/11/2016   HGB 14.1 06/11/2016   HCT 42.9 06/11/2016   PLT 238.0 06/11/2016   GLUCOSE 197 (H) 06/11/2016   CHOL 165 06/11/2016   TRIG 175.0 (H) 06/11/2016   HDL 67.40 06/11/2016   LDLCALC 62 06/11/2016   ALT 18 06/11/2016   AST 24 06/11/2016   NA 139 06/11/2016   K 3.8 06/11/2016   CL 101 06/11/2016   CREATININE 0.72 06/11/2016   BUN 13 06/11/2016   CO2 30 06/11/2016   TSH 3.22 08/07/2015   INR 1.2 RATIO 04/19/2007   HGBA1C 7.8 (H) 06/11/2016   MICROALBUR 0.9 08/07/2015    No results found.   Assessment & Plan:  Plan  I am having Ms.  Sweney start on doxycycline. I am also having her maintain her acetaminophen, aspirin, NOVOFINE, ONE TOUCH ULTRA SYSTEM KIT, ONETOUCH DELICA LANCETS FINE, meloxicam, ONE TOUCH ULTRA TEST, ONE TOUCH ULTRA TEST, LUTEIN VISION BLEND, Bioflavonoid Products (ESTER C PO), ezetimibe-simvastatin, Dulaglutide, metFORMIN, insulin lispro, Insulin Glargine, glimepiride, HUMALOG KWIKPEN, fenofibrate, MENEST, benazepril, glimepiride, FLUoxetine, and ranitidine.  Meds ordered this encounter  Medications  . doxycycline (VIBRA-TABS) 100 MG tablet    Sig: Take 1 tablet (100 mg total) by mouth 2 (two) times daily.    Dispense:  20 tablet    Refill:  0    Problem List Items Addressed This Visit    None    Visit  Diagnoses    Cellulitis of leg, right    -  Primary   Relevant Medications   doxycycline (VIBRA-TABS) 100 MG tablet       Keep leg elevated, take abx  Follow-up: Return in about 2 weeks (around 09/10/2016), or f/u cellulitis.  [name], DO

## 2016-08-27 NOTE — Progress Notes (Signed)
Pre visit review using our clinic review tool, if applicable. No additional management support is needed unless otherwise documented below in the visit note. 

## 2016-08-31 ENCOUNTER — Encounter: Payer: Self-pay | Admitting: Family Medicine

## 2016-08-31 NOTE — Telephone Encounter (Signed)
Please send bactrim ds 1 po bid x 10 days

## 2016-09-02 MED ORDER — SULFAMETHOXAZOLE-TRIMETHOPRIM 800-160 MG PO TABS: 1.0000 | ORAL_TABLET | Freq: Two times a day (BID) | ORAL | 0 refills | Status: DC

## 2016-09-06 NOTE — Telephone Encounter (Signed)
Not understanding the message

## 2016-09-07 NOTE — Telephone Encounter (Signed)
Relation to GN:FAOZpt:self Call back number: [phone]   Reason for call:  Patient states she stopped the antibiotics due to medication not agreeing with her stomach, patient states at last visit if antibiotics didn't work some type of test was going to be conducted, patient doesn't remember what type of test. Please advise patient via phone best # [phone]

## 2016-09-09 NOTE — Telephone Encounter (Signed)
Patient checking on th

## 2016-09-10 ENCOUNTER — Telehealth: Payer: Self-pay | Admitting: Family Medicine

## 2016-09-10 ENCOUNTER — Other Ambulatory Visit: Payer: Self-pay | Admitting: Family Medicine

## 2016-09-10 DIAGNOSIS — L03115 Cellulitis of right lower limb: Secondary | ICD-10-CM

## 2016-09-10 NOTE — Telephone Encounter (Signed)
Called patient to find out why she needed US. States she has a history of an open wound that would not heal. Says she fels lke something is going on in the inside og her leg again. She has tried different ABO but they have not agreed with her stomach. States you mentioned test on her leg to see what may be going on inside.

## 2016-09-10 NOTE — Telephone Encounter (Signed)
Caller name: Relationship to patient: Self Can be reached: [phone]  Pharmacy:  Reason for call: Patient states she has been trying since Tuesday to get a call back about an Ultrasound that should have been ordered for her. States she was having trouble with antibiotics and was told she needed an ultrasound. Patient states she is very upset that she has not been responded to.

## 2016-09-10 NOTE — Telephone Encounter (Signed)
[place] ordered Is area still red and hot?   If worsening-- may need ER

## 2016-09-13 ENCOUNTER — Ambulatory Visit: Payer: Medicare Other | Admitting: Family Medicine

## 2016-09-13 ENCOUNTER — Ambulatory Visit (HOSPITAL_BASED_OUTPATIENT_CLINIC_OR_DEPARTMENT_OTHER)
Admission: RE | Admit: 2016-09-13 | Discharge: 2016-09-13 | Disposition: A | Discharge status: Home / Self Care | Payer: Medicare Other | Source: Ambulatory Visit | Attending: Family Medicine | Admitting: Family Medicine

## 2016-09-13 DIAGNOSIS — L03115 Cellulitis of right lower limb: Secondary | ICD-10-CM | POA: Diagnosis present

## 2016-09-13 NOTE — Telephone Encounter (Signed)
Patient states area on leg is not warm or red. Advised to go to ER if area worsens. Informed patient US ordered.

## 2016-09-15 ENCOUNTER — Other Ambulatory Visit: Payer: Self-pay | Admitting: Internal Medicine

## 2016-09-16 ENCOUNTER — Encounter: Payer: Self-pay | Admitting: Podiatry

## 2016-09-16 ENCOUNTER — Ambulatory Visit (INDEPENDENT_AMBULATORY_CARE_PROVIDER_SITE_OTHER): Payer: Medicare Other | Admitting: Podiatry

## 2016-09-16 VITALS — Ht 63.0 in | Wt 289.0 lb

## 2016-09-16 DIAGNOSIS — E1151 Type 2 diabetes mellitus with diabetic peripheral angiopathy without gangrene: Secondary | ICD-10-CM

## 2016-09-16 DIAGNOSIS — Z794 Long term (current) use of insulin: Secondary | ICD-10-CM

## 2016-09-16 DIAGNOSIS — B351 Tinea unguium: Secondary | ICD-10-CM

## 2016-09-16 DIAGNOSIS — M79676 Pain in unspecified toe(s): Secondary | ICD-10-CM

## 2016-09-16 DIAGNOSIS — E1149 Type 2 diabetes mellitus with other diabetic neurological complication: Secondary | ICD-10-CM

## 2016-09-16 NOTE — Progress Notes (Signed)
Patient ID: Delsa SaleRoberta Sonnen, female   DOB: 02-07-1951, 65 y.o.   MRN: [id number] Complaint:  Visit Type: Patient returns to my office for continued preventative foot care services. Complaint: Patient states" my nails have grown long and thick and become painful to walk and wear shoes".  Painful callus on bottom of both big toes. Patient has been diagnosed with DM with no foot complications. The patient presents for preventative foot care services. No changes to ROS  Podiatric Exam: Vascular: dorsalis pedis and posterior tibial pulses are not palpable bilateral due to foot swelling.. Capillary return is immediate. Temperature gradient is WNL. Skin turgor WNL  Sensorium: Diminished Semmes Weinstein monofilament test. Normal tactile sensation bilaterally. Nail Exam: Pt has thick disfigured discolored nails with subungual debris noted bilateral entire nail hallux through fifth toenails Ulcer Exam: There is no evidence of ulcer or pre-ulcerative changes or infection. Orthopedic Exam: Muscle tone and strength are WNL. No limitations in general ROM. No crepitus or effusions noted. Foot type and digits show no abnormalities. Bony prominences are unremarkable. Skin: No Porokeratosis. No infection or ulcers.  Pinch callus hallux B/L.  Diagnosis:  Onychomycosis, , Pain in right toe, pain in left toes,  Callus B/L  Treatment & Plan Procedures and Treatment: Consent by patient was obtained for treatment procedures. The patient understood the discussion of treatment and procedures well. All questions were answered thoroughly reviewed. Debridement of mycotic and hypertrophic toenails, 1 through 5 bilateral and clearing of subungual debris. No ulceration, no infection noted. Debride callus. Return Visit-Office Procedure: Patient instructed to return to the office for a follow up visit 3 months for continued evaluation and treatment.   Helane GuntherGregory Nikolas Casher DPM

## 2016-09-21 ENCOUNTER — Other Ambulatory Visit: Payer: Self-pay | Admitting: Internal Medicine

## 2016-09-22 MED ORDER — GLIMEPIRIDE 4 MG PO TABS: ORAL_TABLET | ORAL | 0 refills | Status: DC

## 2016-10-08 ENCOUNTER — Telehealth: Payer: Self-pay | Admitting: Family Medicine

## 2016-10-08 DIAGNOSIS — E1165 Type 2 diabetes mellitus with hyperglycemia: Principal | ICD-10-CM

## 2016-10-08 DIAGNOSIS — IMO0002 Reserved for concepts with insufficient information to code with codable children: Secondary | ICD-10-CM

## 2016-10-08 DIAGNOSIS — E1151 Type 2 diabetes mellitus with diabetic peripheral angiopathy without gangrene: Secondary | ICD-10-CM

## 2016-10-11 NOTE — Telephone Encounter (Signed)
OK to refill

## 2016-10-11 NOTE — Telephone Encounter (Signed)
Pt seen by Endo for DM management. Forwarding refill request.

## 2016-10-12 MED ORDER — GLIMEPIRIDE 4 MG PO TABS: 4.0000 mg | ORAL_TABLET | Freq: Two times a day (BID) | ORAL | 0 refills | Status: DC

## 2016-10-12 NOTE — Telephone Encounter (Signed)
Done

## 2016-10-27 ENCOUNTER — Other Ambulatory Visit: Payer: Self-pay | Admitting: Internal Medicine

## 2016-10-27 DIAGNOSIS — IMO0002 Reserved for concepts with insufficient information to code with codable children: Secondary | ICD-10-CM

## 2016-10-27 DIAGNOSIS — E1165 Type 2 diabetes mellitus with hyperglycemia: Principal | ICD-10-CM

## 2016-10-27 DIAGNOSIS — E1151 Type 2 diabetes mellitus with diabetic peripheral angiopathy without gangrene: Secondary | ICD-10-CM

## 2016-11-04 ENCOUNTER — Other Ambulatory Visit: Payer: Self-pay | Admitting: Family Medicine

## 2016-11-15 ENCOUNTER — Emergency Department (HOSPITAL_BASED_OUTPATIENT_CLINIC_OR_DEPARTMENT_OTHER)
Admission: EM | Admit: 2016-11-15 | Discharge: 2016-11-15 | Disposition: A | Discharge status: Home / Self Care | Payer: Medicare Other | Attending: Emergency Medicine | Admitting: Emergency Medicine

## 2016-11-15 ENCOUNTER — Emergency Department (HOSPITAL_BASED_OUTPATIENT_CLINIC_OR_DEPARTMENT_OTHER): Payer: Medicare Other

## 2016-11-15 ENCOUNTER — Encounter: Payer: Self-pay | Admitting: Family Medicine

## 2016-11-15 ENCOUNTER — Ambulatory Visit (INDEPENDENT_AMBULATORY_CARE_PROVIDER_SITE_OTHER): Payer: Medicare Other | Admitting: Family Medicine

## 2016-11-15 ENCOUNTER — Encounter (HOSPITAL_BASED_OUTPATIENT_CLINIC_OR_DEPARTMENT_OTHER): Payer: Self-pay | Admitting: *Deleted

## 2016-11-15 VITALS — BP 110/60 | HR 92 | Temp 98.4°F | Ht 63.0 in | Wt 266.8 lb

## 2016-11-15 DIAGNOSIS — Z87891 Personal history of nicotine dependence: Secondary | ICD-10-CM | POA: Diagnosis not present

## 2016-11-15 DIAGNOSIS — I10 Essential (primary) hypertension: Secondary | ICD-10-CM | POA: Diagnosis not present

## 2016-11-15 DIAGNOSIS — R531 Weakness: Secondary | ICD-10-CM

## 2016-11-15 DIAGNOSIS — R05 Cough: Secondary | ICD-10-CM | POA: Diagnosis not present

## 2016-11-15 DIAGNOSIS — E1165 Type 2 diabetes mellitus with hyperglycemia: Secondary | ICD-10-CM | POA: Insufficient documentation

## 2016-11-15 DIAGNOSIS — Z79899 Other long term (current) drug therapy: Secondary | ICD-10-CM | POA: Insufficient documentation

## 2016-11-15 DIAGNOSIS — E1151 Type 2 diabetes mellitus with diabetic peripheral angiopathy without gangrene: Secondary | ICD-10-CM

## 2016-11-15 DIAGNOSIS — R062 Wheezing: Secondary | ICD-10-CM

## 2016-11-15 DIAGNOSIS — Z794 Long term (current) use of insulin: Secondary | ICD-10-CM | POA: Insufficient documentation

## 2016-11-15 DIAGNOSIS — R0602 Shortness of breath: Secondary | ICD-10-CM | POA: Insufficient documentation

## 2016-11-15 DIAGNOSIS — R739 Hyperglycemia, unspecified: Secondary | ICD-10-CM

## 2016-11-15 DIAGNOSIS — Z9114 Patient's other noncompliance with medication regimen: Secondary | ICD-10-CM | POA: Diagnosis not present

## 2016-11-15 DIAGNOSIS — Z7982 Long term (current) use of aspirin: Secondary | ICD-10-CM | POA: Insufficient documentation

## 2016-11-15 DIAGNOSIS — IMO0002 Reserved for concepts with insufficient information to code with codable children: Secondary | ICD-10-CM

## 2016-11-15 DIAGNOSIS — R059 Cough, unspecified: Secondary | ICD-10-CM

## 2016-11-15 LAB — CBC WITH DIFFERENTIAL/PLATELET
Basophils Absolute: 0 10*3/uL (ref 0.0–0.1)
Basophils Relative: 0 %
Eosinophils Absolute: 0 10*3/uL (ref 0.0–0.7)
Eosinophils Relative: 1 %
HCT: 45.9 % (ref 36.0–46.0)
Hemoglobin: 14.6 g/dL (ref 12.0–15.0)
Lymphocytes Relative: 21 %
Lymphs Abs: 1.3 10*3/uL (ref 0.7–4.0)
MCH: 28.2 pg (ref 26.0–34.0)
MCHC: 31.8 g/dL (ref 30.0–36.0)
MCV: 88.6 fL (ref 78.0–100.0)
Monocytes Absolute: 0.5 10*3/uL (ref 0.1–1.0)
Monocytes Relative: 8 %
Neutro Abs: 4.3 10*3/uL (ref 1.7–7.7)
Neutrophils Relative %: 70 %
Platelets: 201 10*3/uL (ref 150–400)
RBC: 5.18 MIL/uL — ABNORMAL HIGH (ref 3.87–5.11)
RDW: 14.4 % (ref 11.5–15.5)
WBC: 6.1 10*3/uL (ref 4.0–10.5)

## 2016-11-15 LAB — COMPREHENSIVE METABOLIC PANEL
ALT: 24 U/L (ref 14–54)
AST: 26 U/L (ref 15–41)
Albumin: 3.4 g/dL — ABNORMAL LOW (ref 3.5–5.0)
Alkaline Phosphatase: 44 U/L (ref 38–126)
Anion gap: 12 (ref 5–15)
BUN: 13 mg/dL (ref 6–20)
CO2: 24 mmol/L (ref 22–32)
Calcium: 9 mg/dL (ref 8.9–10.3)
Chloride: 97 mmol/L — ABNORMAL LOW (ref 101–111)
Creatinine, Ser: 0.96 mg/dL (ref 0.44–1.00)
GFR calc Af Amer: >60 mL/min (ref >60)
GFR calc non Af Amer: >60 mL/min (ref >60)
Glucose, Bld: 459 mg/dL — ABNORMAL HIGH (ref 65–99)
Potassium: 4.3 mmol/L (ref 3.5–5.1)
Sodium: 133 mmol/L — ABNORMAL LOW (ref 135–145)
Total Bilirubin: 0.8 mg/dL (ref 0.3–1.2)
Total Protein: 6.8 g/dL (ref 6.5–8.1)

## 2016-11-15 LAB — BRAIN NATRIURETIC PEPTIDE: B Natriuretic Peptide: 41.2 pg/mL (ref 0.0–100.0)

## 2016-11-15 LAB — POC INFLUENZA A&B (BINAX/QUICKVUE)
Influenza A, POC: NEGATIVE
Influenza B, POC: NEGATIVE

## 2016-11-15 LAB — CBG MONITORING, ED: Glucose-Capillary: 349 mg/dL — ABNORMAL HIGH (ref 65–99)

## 2016-11-15 LAB — URINALYSIS, ROUTINE W REFLEX MICROSCOPIC
Glucose, UA: >=500 mg/dL — AB
Hgb urine dipstick: NEGATIVE
Ketones, ur: 15 mg/dL — AB
Leukocytes, UA: NEGATIVE
Nitrite: NEGATIVE
Protein, ur: 30 mg/dL — AB
Specific Gravity, Urine: 1.039 — ABNORMAL HIGH (ref 1.005–1.030)
pH: 5.5 (ref 5.0–8.0)

## 2016-11-15 LAB — GLUCOSE, POCT (MANUAL RESULT ENTRY): POC Glucose: 428 mg/dl — AB (ref 70–99)

## 2016-11-15 LAB — URINALYSIS, MICROSCOPIC (REFLEX)

## 2016-11-15 IMAGING — CR DG CHEST 2V
2 series · 2 of 2 positions shown · non-contrast
Comparison: [DATE]

CLINICAL DATA: Shortness of breath and sinus congestion.

EXAM:
CHEST  2 VIEW

[w chest pa]
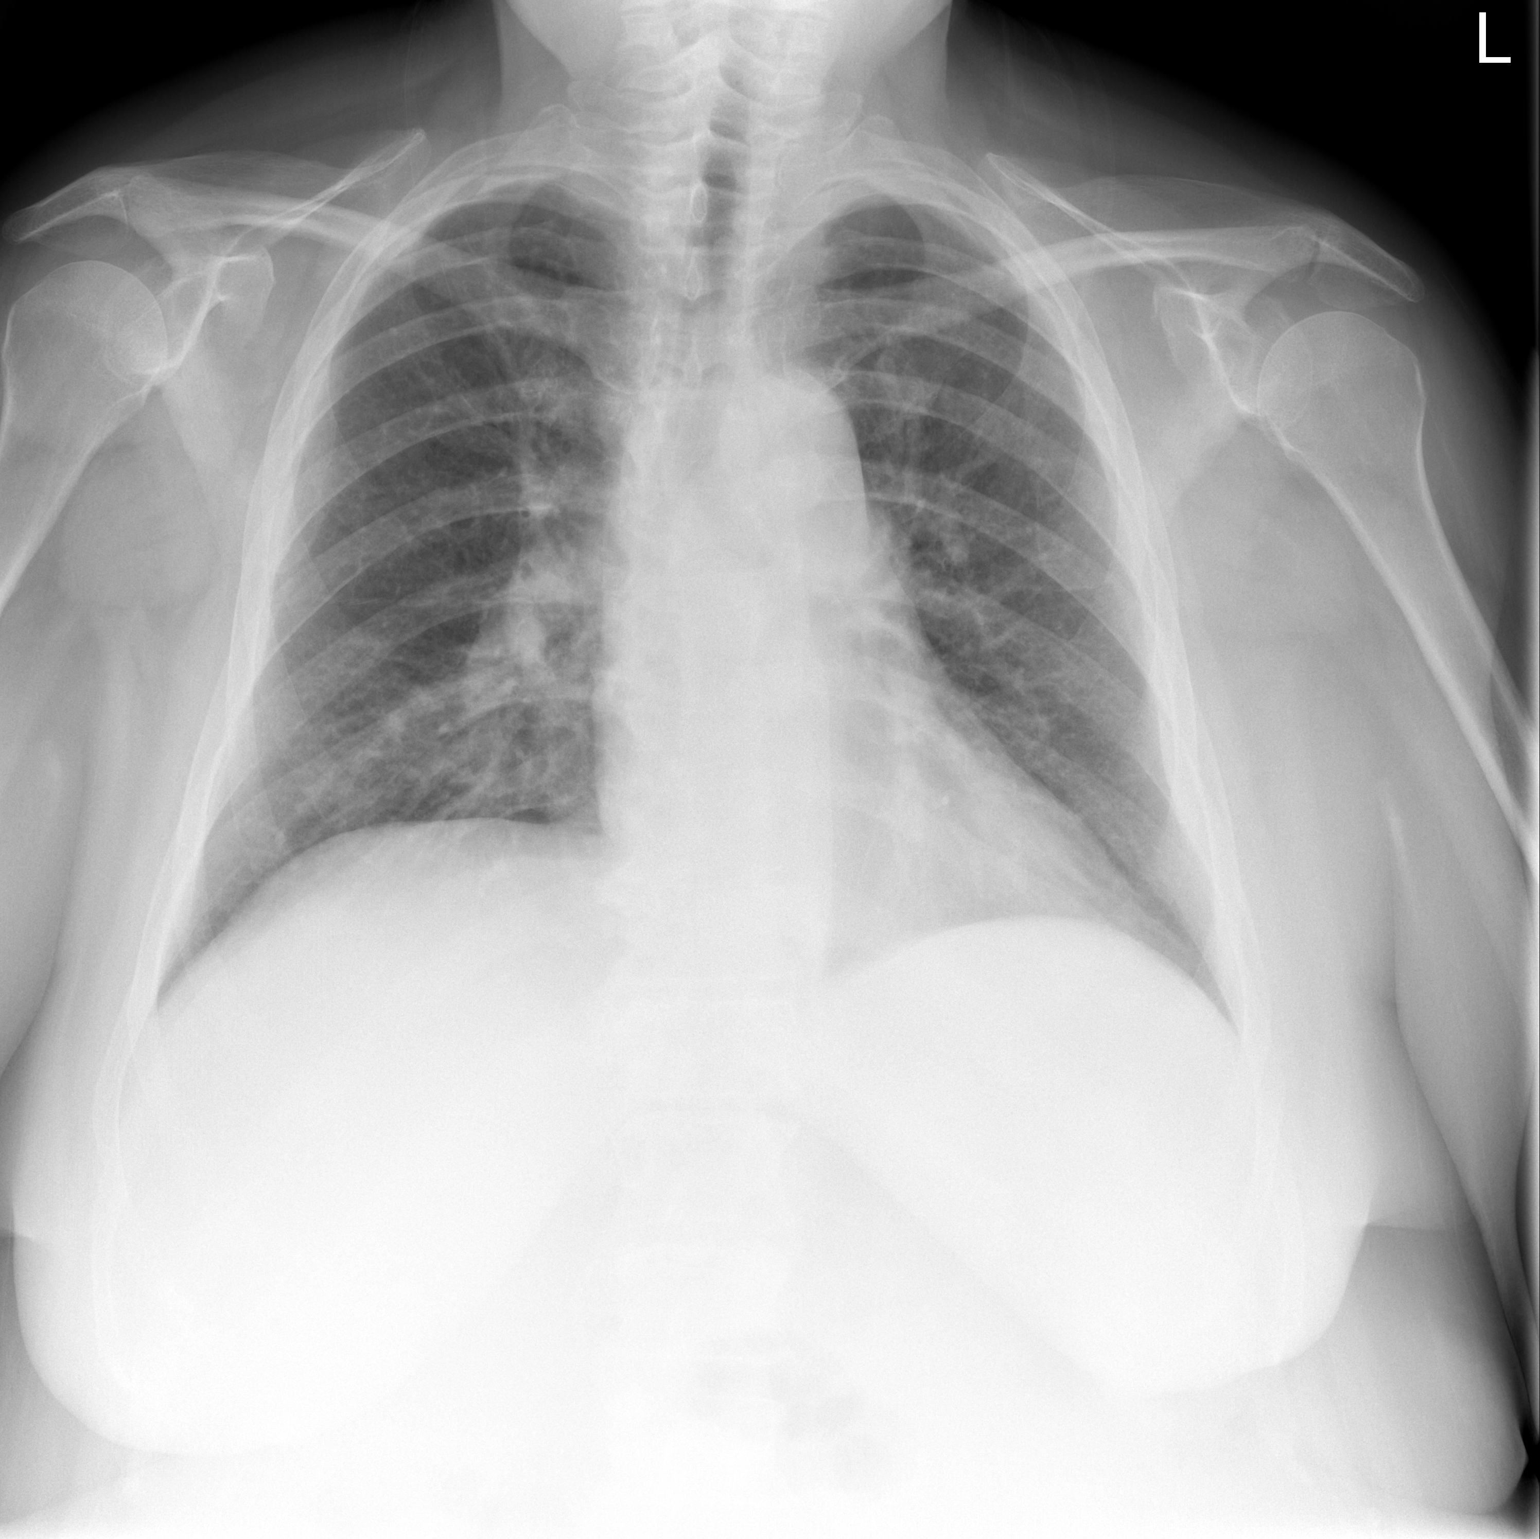

[w chest lat]
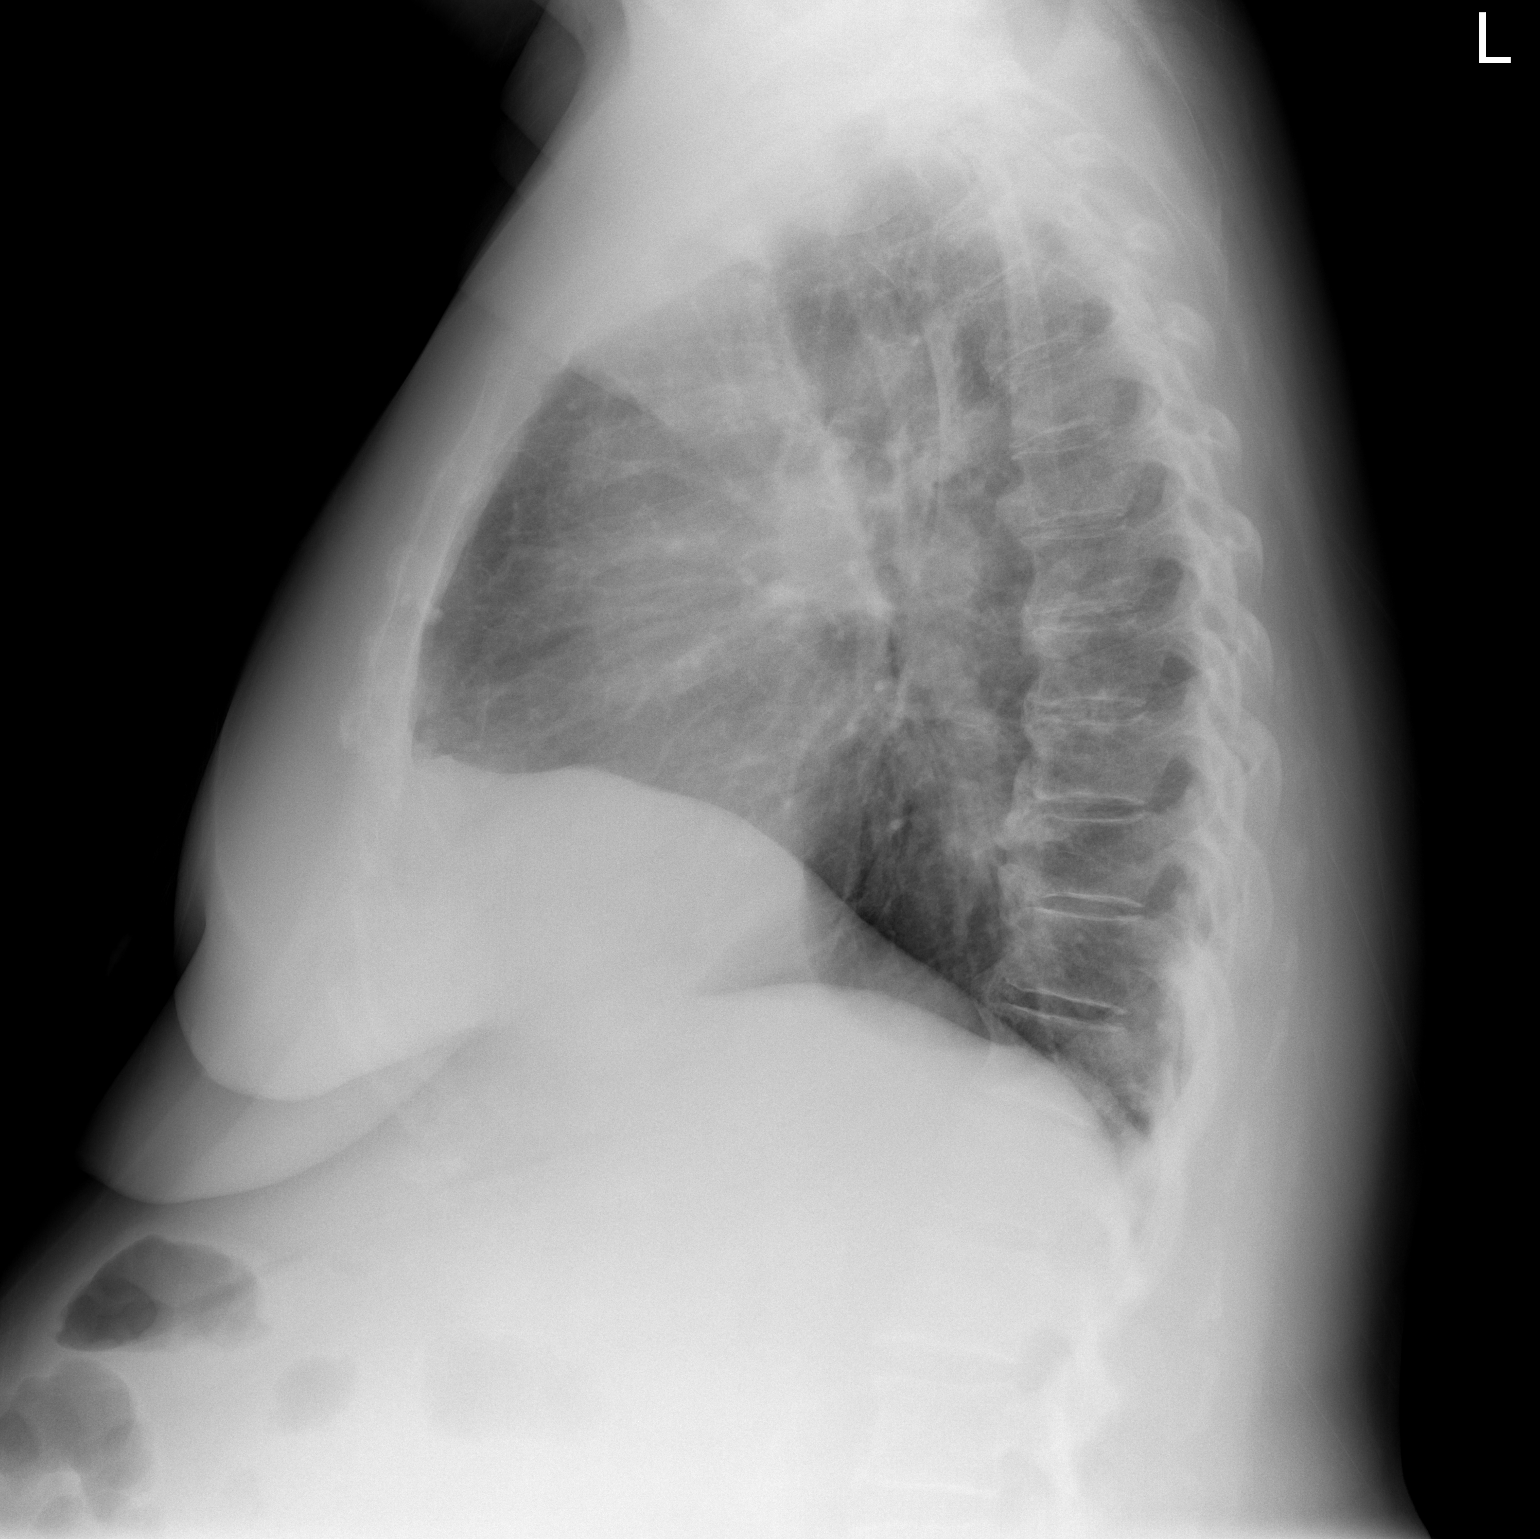

[2 of 2 positions shown; findings below may reference images not displayed]

FINDINGS: Lungs are adequately inflated without focal consolidation or
effusion. Cardiomediastinal silhouette is within normal. There is
mild degenerative change of the spine.
IMPRESSION: No active cardiopulmonary disease.

## 2016-11-15 MED ORDER — AZITHROMYCIN 250 MG PO TABS: 250.0000 mg | ORAL_TABLET | Freq: Every day | ORAL | 0 refills | Status: DC

## 2016-11-15 MED ORDER — ALBUTEROL SULFATE HFA 108 (90 BASE) MCG/ACT IN AERS
1.0000 | INHALATION_SPRAY | Freq: Once | RESPIRATORY_TRACT | Status: AC
  Administered 2016-11-15: 2 via RESPIRATORY_TRACT
  Filled 2016-11-15: qty 6.7

## 2016-11-15 MED ORDER — DULAGLUTIDE 1.5 MG/0.5ML ~~LOC~~ SOAJ: SUBCUTANEOUS | 1 refills | Status: DC

## 2016-11-15 MED ORDER — INSULIN REGULAR HUMAN 100 UNIT/ML IJ SOLN
10.0000 [IU] | Freq: Once | INTRAMUSCULAR | Status: AC
  Administered 2016-11-15: 10 [IU] via SUBCUTANEOUS
  Filled 2016-11-15: qty 1

## 2016-11-15 MED ORDER — IPRATROPIUM-ALBUTEROL 0.5-2.5 (3) MG/3ML IN SOLN
3.0000 mL | Freq: Four times a day (QID) | RESPIRATORY_TRACT | Status: DC
  Administered 2016-11-15: 3 mL via RESPIRATORY_TRACT

## 2016-11-15 MED ORDER — ALBUTEROL SULFATE (2.5 MG/3ML) 0.083% IN NEBU
5.0000 mg | INHALATION_SOLUTION | Freq: Once | RESPIRATORY_TRACT | Status: AC
  Administered 2016-11-15: 5 mg via RESPIRATORY_TRACT
  Filled 2016-11-15: qty 6

## 2016-11-15 MED ORDER — INSULIN GLARGINE 100 UNIT/ML SOLOSTAR PEN: PEN_INJECTOR | SUBCUTANEOUS | 1 refills | Status: DC

## 2016-11-15 MED ORDER — BENZONATATE 100 MG PO CAPS
100.0000 mg | ORAL_CAPSULE | Freq: Three times a day (TID) | ORAL | 0 refills | Status: DC
Start: 2016-11-15 — End: 2017-01-24

## 2016-11-15 MED ORDER — SODIUM CHLORIDE 0.9 % IV BOLUS (SEPSIS)
1000.0000 mL | Freq: Once | INTRAVENOUS | Status: AC
  Administered 2016-11-15: 1000 mL via INTRAVENOUS

## 2016-11-15 NOTE — Progress Notes (Signed)
[place] [facility] [address], [place], [place] [address] [phone]  Date:  11/15/2016   Name:  Andrea Marks   DOB:  1951/05/22   MRN:  [id number]  PCP:  [name], DO    Chief Complaint: Shortness of Breath (c/o poss flu with sob, coughing, weakness. pt states that it feels like someone is sitting on her chest. Sx's have been present for about a month and just keeps getting worse. Pt would like refill on her insulin pens. )   History of Present Illness:  Andrea Marks is a 66 y.o. very pleasant female patient who presents with the following:  History of morbid obesity, DM on insulin, hyperlipidemia.  Pt of my partner Dr. Etter Sjogren.   She notes that she "has had this for over a month-" she has noted nasal congestion, cough, weakness, "I just don't feel good."  "right now I feel like someone is sitting on my chest," she has noted this feeling for approx one week She has had a cough and dry mouth The cough is dry She has not checked her temperature and is not sure if she has run a fever She noted chest heaviness "but I would not call it a pain." She has noted some wheezing off and on since she got sick  Lab Results  Component Value Date   HGBA1C 7.8 (H) 06/11/2016    Her endocrinologist is Dr. Cruzita Lederer She ran out of her trulicity 2 months ago.   She is also supposed to be on lantus but she also ran out of this a month ago also She does not check her glucose at home so does not know how she has been running  Wt Readings from Last 3 Encounters:  11/15/16 266 lb 12.8 oz (121 kg)  09/16/16 289 lb (131.1 kg)  08/27/16 289 lb (131.1 kg)   She has lost 20+ lbs since november- suspect due to her insulin being out of control but she also states she has not been eating much - just soup- for a week or more.   She does not have any history of glaucoma Never had asthma or used albuterol in the past   Patient  Active Problem List   Diagnosis Date Noted  . Type 2 diabetes mellitus with hyperglycemia, with long-term current use of insulin ([place]) 10/23/2015  . 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 Thrombophlebitis leg ([place]) 02/23/2011  . TINEA CORPORIS 12/30/2008  . Hyperlipidemia 12/14/2006  . DEPRESSION 12/14/2006  . Essential hypertension 12/14/2006  . GERD 12/14/2006    Past Medical History:  Diagnosis Date  . Depression   . Diabetes mellitus   . GERD (gastroesophageal reflux disease)   . Hyperlipidemia   . Hypertension   . Obesity     Past Surgical History:  Procedure Laterality Date  . ABDOMINAL HYSTERECTOMY  1991   BSO    Social History  Substance Use Topics  . Smoking status: Former Research scientist (life sciences)  . Smokeless tobacco: Never Used  . Alcohol use No    Family History  Problem Relation Age of Onset  . Macular degeneration Mother   . Heart disease Mother     syncope  . Lung disease Father 76    mesothelioma  . Cancer Maternal Aunt     stomach  . Heart disease Paternal Uncle     cabg  . Diabetes Paternal Uncle   . Heart disease Paternal Uncle   .  Diabetes Paternal Uncle   . Diabetes Paternal Uncle   . Heart disease Paternal Uncle   . Heart disease Paternal Uncle   . Heart disease Paternal Uncle   . Lung cancer      asbestos  . Diabetes Paternal Grandmother   . Cancer Other     lung    Allergies  Allergen Reactions  . Hydrocodone-Acetaminophen Other (See Comments)    unknown  . Oxycodone Hcl Other (See Comments)    unknown  . Penicillins Other (See Comments)    unknown  . Prednisone     Medication list has been reviewed and updated.  Current Outpatient Prescriptions on File Prior to Visit  Medication Sig Dispense Refill  . acetaminophen (TYLENOL ARTHRITIS PAIN) 650 MG CR tablet Take 650 mg by mouth 2 (two) times daily as needed. For pain    . aspirin 81 MG tablet Take 81 mg by mouth daily.      . benazepril (LOTENSIN) 20 MG tablet TAKE 1 TABLET  DAILY 90 tablet 1  . Bioflavonoid Products (ESTER C PO) Take 500 mg by mouth daily.    . Blood Glucose Monitoring Suppl (ONE TOUCH ULTRA SYSTEM KIT) W/DEVICE KIT 1 kit by Does not apply route once. 1 each 0  . doxycycline (VIBRA-TABS) 100 MG tablet Take 1 tablet (100 mg total) by mouth 2 (two) times daily. 20 tablet 0  . Dulaglutide (TRULICITY) 1.5 KN/3.9JQ SOPN INJECT 1.5 MG INTO THE SKIN ONCE A WEEK (REPLACES THE VICTOZA) 12 pen 1  . ezetimibe-simvastatin (VYTORIN) 10-40 MG tablet TAKE 1 TABLET AT BEDTIME (REPEAT LABS ARE DUE NOW) 90 tablet 0  . fenofibrate 160 MG tablet Take 1 tablet (160 mg total) by mouth daily. 90 tablet 1  . FLUoxetine (PROZAC) 20 MG capsule TAKE 3 CAPSULES DAILY 270 capsule 0  . glimepiride (AMARYL) 4 MG tablet TAKE 1 TABLET TWICE A DAY (NEED APPOINTMENT FOR FURTHER REFILLS) 180 tablet 0  . HUMALOG KWIKPEN 100 UNIT/ML KiwkPen INJECT 10 UNITS BEFORE BREAKFAST, 10 UNITS BEFORE LUNCH AND 18 UNITS BEFORE SUPPER DAILY 45 mL 0  . Insulin Glargine (LANTUS SOLOSTAR) 100 UNIT/ML Solostar Pen 45 units at Bedtime 30 mL 1  . insulin lispro (HUMALOG KWIKPEN) 100 UNIT/ML KiwkPen Inject 10 units before breakfast, 10 units before lunch and 18 units before supper daily. 45 mL 1  . meloxicam (MOBIC) 15 MG tablet Take 1 tablet by mouth daily as needed.  0  . MENEST 1.25 MG TABS TAKE 2 TABLETS EVERY OTHER DAY 90 tablet 1  . metFORMIN (GLUCOPHAGE-XR) 750 MG 24 hr tablet Take 1 tablet (750 mg total) by mouth 3 (three) times daily. 270 tablet 1  . Misc Natural Products (LUTEIN VISION BLEND) CAPS Take 1 capsule by mouth daily.    Marland Kitchen NOVOFINE 32G X 6 MM MISC USE 1 NEEDLE UNDER THE SKIN DAILY 100 each 1  . ONE TOUCH ULTRA TEST test strip USE TO TEST BLOOD SUGAR 2 TIMES DAILY AS INSTRUCTED. DX: E11.65 50 each 3  . ONE TOUCH ULTRA TEST test strip USE TO TEST BLOOD SUGAR TWO TIMES DAILY AS INSTRUCTED 200 each 0  . ONETOUCH DELICA LANCETS FINE MISC USE AS INSTRUCTED 1 each 9  . ranitidine (ZANTAC) 300  MG capsule TAKE 1 CAPSULE DAILY 90 capsule 3   No current facility-administered medications on file prior to visit.     Review of Systems: Diarrhea- no vomiting Feels weak, tired and SOB As per HPI- otherwise negative.   Physical  Examination: Vitals:   11/15/16 1306  BP: 110/60  Pulse: 92  Temp: 98.4 F (36.9 C)   Vitals:   11/15/16 1306  Weight: 266 lb 12.8 oz (121 kg)  Height: _0  (1.6 m)   Body mass index is 47.26 kg/m. Ideal Body Weight: Weight in (lb) to have BMI = 25: 140.8  GEN: WDWN, NAD, Non-toxic, A & O x 3, morbid obesity HEENT: Atraumatic, Normocephalic. Neck supple. No masses, No LAD. Ears and Nose: No external deformity. CV: RRR, No M/G/R. No JVD. No thrill. No extra heart sounds. PULM: diffuse wheezes, no crackles, rhonchi. No retractions. No resp. distress. No accessory muscle use. EXTR: No c/c/e NEURO slow gait.  Unable to get on exam table, "I feel too lightheaded."  PSYCH: Normally interactive. Conversant. Not depressed or anxious appearing.  Calm demeanor.   Given duoneb x1- did not improve her wheezing to my exam  Results for orders placed or performed in visit on 11/15/16  POCT glucose (manual entry)  Result Value Ref Range   POC Glucose 428 (A) 70 - 99 mg/dl    Assessment and Plan: Wheezing - Plan: ipratropium-albuterol (DUONEB) 0.5-2.5 (3) MG/3ML nebulizer solution 3 mL  DM (diabetes mellitus) type II uncontrolled, periph vascular disorder (HCC) - Plan: Insulin Glargine (LANTUS SOLOSTAR) 100 UNIT/ML Solostar Pen, Dulaglutide (TRULICITY) 1.5 EB/3.4DH SOPN  Noncompliance w/medication treatment due to intermit use of medication - Plan: POCT glucose (manual entry)  Weakness  SOB (shortness of breath)  Here today with malaise, wheezing, weight loss and uncontrolled hyperglycemia due to med non- compliance.  duoneb did not improve her wheezing, she feels too weak to walk to x-ray.  Given her constellation of symptoms I feel she needs ER  evaluation and treatment.  Called ahead and PA on duty graciously accepted patient. We will bring her to the ER in wheelchair Refilled her truclity and lantus. Went over how to titrate back onto her lantus since she has been off of it   Signed Lamar Blinks, MD

## 2016-11-15 NOTE — Progress Notes (Signed)
Pre visit review using our clinic review tool, if applicable. No additional management support is needed unless otherwise documented below in the visit note. 

## 2016-11-15 NOTE — ED Notes (Signed)
Pt speaking full sentences with clear voice. NAD. Reports cough for more than 30 days.

## 2016-11-15 NOTE — Patient Instructions (Signed)
I refilled your lantus and trulicity today Start back on 15 units of lantus.  You can increase your dose by 5 units every 2-3 days as long as your fasting glucose is over 200.  Once your glucose is under 200 adjust up to 2 units every 2 days until your fasting glucose reaches 150.  You can then stop.  Please schedule a follow-up with Dr. Elvera LennoxGherghe asap

## 2016-11-15 NOTE — Addendum Note (Signed)
Addended by: Cammy CopaHANDLER, Darinda [place] N on: 11/15/2016 02:01 PM   Modules accepted: Orders

## 2016-11-15 NOTE — ED Provider Notes (Signed)
Sign out from Langston MaskerKaren Sofia, PA-C    D/c if negative urine, ambulatory; d/c with z-pak, inhaler  UA shows glucose >500, small bilirubin, 15 ketones, 30 protein, many bacteria, 0-5 squamous epithelial cells. Urine culture sent. Patient feeling breathing improved after nebulizers in the ED. We'll discharge home with albuterol inhaler, Tessalon, Z-Pak. Follow-up to PCP as needed. Return precautions discussed. Patient vitals stable throughout ED course and discharged in satisfactory condition. Patient also evaluated by Dr. Eudelia Bunchardama who guided the patient's management and agrees with plan.   Emi Holeslexandra M Dhwani Venkatesh, PA-C 11/15/16 1835

## 2016-11-15 NOTE — ED Notes (Signed)
Patient transported to X-ray 

## 2016-11-15 NOTE — ED Provider Notes (Signed)
[place] DEPT [facility] Provider Note   CSN: [id number] Arrival date & time: 11/15/16  1346     History   Chief Complaint Chief Complaint  Patient presents with  . Shortness of Breath    HPI Andrea Marks is a 66 y.o. female.  The history is provided by the patient. No language interpreter was used.  Shortness of Breath  This is a new problem. The problem occurs frequently.The current episode started more than 1 week ago. The problem has been gradually worsening. Associated symptoms include cough and sputum production. Pertinent negatives include no fever. She has tried nothing for the symptoms. The treatment provided no relief. Associated medical issues do not include chronic lung disease or heart failure.  Pt saw primary MD today.   Pt had elevated glucose over 450.  Pt was wheezing.  Pt received duoneb with no improvement so primary care sent her to Ed for IV fluids and chest xray  Past Medical History:  Diagnosis Date  . Depression   . Diabetes mellitus   . GERD (gastroesophageal reflux disease)   . Hyperlipidemia   . Hypertension   . Obesity     Patient Active Problem List   Diagnosis Date Noted  . Morbid obesity ([place]) 11/15/2016  . Type 2 diabetes mellitus with hyperglycemia, with long-term current use of insulin ([place]) 10/23/2015  . 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 Thrombophlebitis leg ([place]) 02/23/2011  . TINEA CORPORIS 12/30/2008  . Hyperlipidemia 12/14/2006  . DEPRESSION 12/14/2006  . Essential hypertension 12/14/2006  . GERD 12/14/2006    Past Surgical History:  Procedure Laterality Date  . ABDOMINAL HYSTERECTOMY  1991   BSO    OB History    No data available       Home Medications    Prior to Admission medications   Medication Sig Start Date End Date Taking? Authorizing Provider  acetaminophen (TYLENOL ARTHRITIS PAIN) 650 MG CR tablet Take 650 mg by mouth 2  (two) times daily as needed. For pain    Historical Provider, MD  aspirin 81 MG tablet Take 81 mg by mouth daily.      Historical Provider, MD  benazepril (LOTENSIN) 20 MG tablet TAKE 1 TABLET DAILY 07/12/16   Alferd Apa Lowne Chase, DO  Bioflavonoid Products (ESTER C PO) Take 500 mg by mouth daily.    Historical Provider, MD  Blood Glucose Monitoring Suppl (ONE TOUCH ULTRA SYSTEM KIT) W/DEVICE KIT 1 kit by Does not apply route once. 04/06/13   Philemon Kingdom, MD  doxycycline (VIBRA-TABS) 100 MG tablet Take 1 tablet (100 mg total) by mouth 2 (two) times daily. 08/27/16   Alferd Apa Lowne Chase, DO  Dulaglutide (TRULICITY) 1.5 OZ/3.0QM SOPN INJECT 1.5 MG INTO THE SKIN ONCE A WEEK 11/15/16   Gay Filler Copland, MD  ezetimibe-simvastatin (VYTORIN) 10-40 MG tablet TAKE 1 TABLET AT BEDTIME (REPEAT LABS ARE DUE NOW) 06/11/16   Rosalita Chessman Chase, DO  fenofibrate 160 MG tablet Take 1 tablet (160 mg total) by mouth daily. 06/22/16   Rosalita Chessman Chase, DO  FLUoxetine (PROZAC) 20 MG capsule TAKE 3 CAPSULES DAILY 11/05/16   Alferd Apa Lowne Chase, DO  glimepiride (AMARYL) 4 MG tablet TAKE 1 TABLET TWICE A DAY (NEED APPOINTMENT FOR FURTHER REFILLS) 10/27/16   Philemon Kingdom, MD  HUMALOG KWIKPEN 100 UNIT/ML KiwkPen INJECT 10 UNITS BEFORE BREAKFAST, 10 UNITS BEFORE LUNCH AND 18 UNITS BEFORE SUPPER DAILY 09/16/16   Carlus Pavlov, MD  Insulin Glargine (LANTUS SOLOSTAR) 100 UNIT/ML Solostar Pen 45 units subcue daily at Bedtime 11/15/16   Gwenlyn Found Copland, MD  insulin lispro (HUMALOG KWIKPEN) 100 UNIT/ML KiwkPen Inject 10 units before breakfast, 10 units before lunch and 18 units before supper daily. 06/11/16   Lelon Perla Chase, DO  meloxicam (MOBIC) 15 MG tablet Take 1 tablet by mouth daily as needed. 10/01/15   Historical Provider, MD  MENEST 1.25 MG TABS TAKE 2 TABLETS EVERY OTHER DAY 07/12/16   Lelon Perla Chase, DO  metFORMIN (GLUCOPHAGE-XR) 750 MG 24 hr tablet Take 1 tablet (750 mg total) by mouth 3 (three)  times daily. 06/11/16   Lelon Perla Chase, DO  Misc Natural Products (LUTEIN VISION BLEND) CAPS Take 1 capsule by mouth daily.    Historical Provider, MD  NOVOFINE 32G X 6 MM MISC USE 1 NEEDLE UNDER THE SKIN DAILY 08/31/12   Yvonne R Lowne Chase, DO  ONE TOUCH ULTRA TEST test strip USE TO TEST BLOOD SUGAR 2 TIMES DAILY AS INSTRUCTED. DX: E11.65 11/20/15   Carlus Pavlov, MD  ONE TOUCH ULTRA TEST test strip USE TO TEST BLOOD SUGAR TWO TIMES DAILY AS INSTRUCTED 04/26/16   Carlus Pavlov, MD  [facility] DELICA LANCETS FINE MISC USE AS INSTRUCTED 03/20/14   Carlus Pavlov, MD  ranitidine (ZANTAC) 300 MG capsule TAKE 1 CAPSULE DAILY 08/06/16   Donato Schultz, DO    Family History Family History  Problem Relation Age of Onset  . Macular degeneration Mother   . Heart disease Mother     syncope  . Lung disease Father 33    mesothelioma  . Cancer Maternal Aunt     stomach  . Heart disease Paternal Uncle     cabg  . Diabetes Paternal Uncle   . Heart disease Paternal Uncle   . Diabetes Paternal Uncle   . Diabetes Paternal Uncle   . Heart disease Paternal Uncle   . Heart disease Paternal Uncle   . Heart disease Paternal Uncle   . Lung cancer      asbestos  . Diabetes Paternal Grandmother   . Cancer Other     lung    Social History Social History  Substance Use Topics  . Smoking status: Former Games developer  . Smokeless tobacco: Never Used  . Alcohol use No     Allergies   Hydrocodone-acetaminophen; Oxycodone hcl; Penicillins; and Prednisone   Review of Systems Review of Systems  Constitutional: Negative for fever.  Respiratory: Positive for cough, sputum production and shortness of breath.   All other systems reviewed and are negative.    Physical Exam Updated Vital Signs BP 116/87   Pulse 72   Temp 98.1 F (36.7 C) (Oral)   Resp 21   Ht 5\' 3"  (1.6 m)   Wt 120.7 kg   SpO2 97%   BMI 47.12 kg/m   Physical Exam  Constitutional: She is oriented to person, place,  and time. She appears well-developed and well-nourished.  HENT:  Head: Normocephalic.  Right Ear: External ear normal.  Left Ear: External ear normal.  Eyes: EOM are normal.  Neck: Normal range of motion.  Cardiovascular: Normal rate and regular rhythm.   Pulmonary/Chest: Effort normal. She has wheezes.  Abdominal: Soft. Bowel sounds are normal. She exhibits no distension.  Musculoskeletal: Normal range  of motion.  Neurological: She is alert and oriented to person, place, and time.  Skin: Skin is warm.  Psychiatric: She has a normal mood and affect.  Nursing note and vitals reviewed.    ED Treatments / Results  Labs (all labs ordered are listed, but only abnormal results are displayed) Labs Reviewed  COMPREHENSIVE METABOLIC PANEL - Abnormal; Notable for the following:       Result Value   Sodium 133 (*)    Chloride 97 (*)    Glucose, Bld 459 (*)    Albumin 3.4 (*)    All other components within normal limits  CBC WITH DIFFERENTIAL/PLATELET - Abnormal; Notable for the following:    RBC 5.18 (*)    All other components within normal limits  BRAIN NATRIURETIC PEPTIDE    EKG  EKG Interpretation  Date/Time:  Monday November 15 2016 14:34:28 EST Ventricular Rate:  85 PR Interval:    QRS Duration: 92 QT Interval:  376 QTC Calculation: 448 R Axis:   78 Text Interpretation:  Sinus rhythm Low voltage, precordial leads No old tracing to compare Confirmed by [place] [phone]) on 11/15/2016 2:54:19 PM       Radiology Dg Chest 2 View  Result Date: 11/15/2016 CLINICAL DATA:  Shortness of breath and sinus congestion. EXAM: CHEST  2 VIEW COMPARISON:  01/27/2012 FINDINGS: Lungs are adequately inflated without focal consolidation or effusion. Cardiomediastinal silhouette is within normal. There is mild degenerative change of the spine. IMPRESSION: No active cardiopulmonary disease. Electronically Signed   By: Marin Olp M.D.   On: 11/15/2016 14:15     Procedures Procedures (including critical care time)  Medications Ordered in ED Medications  albuterol (PROVENTIL) (2.5 MG/3ML) 0.083% nebulizer solution 5 mg (not administered)  sodium chloride 0.9 % bolus 1,000 mL (1,000 mLs Intravenous New Bag/Given 11/15/16 1528)  insulin regular (NOVOLIN R,HUMULIN R) 250 units/2.62m (100 units/mL) injection 10 Units (10 Units Subcutaneous Given 11/15/16 1528)     Initial Impression / Assessment and Plan / ED Course  I have reviewed the triage vital signs and the nursing notes.  Pertinent labs & imaging results that were available during my care of the patient were reviewed by me and considered in my medical decision making (see chart for details).  Clinical Course     Pt given Iv fluids and 10 units of insulin subq.   Pt given albuterol Neb.   Chest xray shows no acute abnormality.   Pt's care turned over to AA[place] Peaks5:00pm.  Plan recheck 02 cbg and ambulate after observation.  I anticipate pt will be able to go home   Final Clinical Impressions(s) / ED Diagnoses   Final diagnoses:  None    New Prescriptions New Prescriptions   No medications on file     LFransico Meadow PA-C 11/15/16 1[place] MD 11/16/16 [phone]

## 2016-11-15 NOTE — ED Triage Notes (Signed)
SOB. She was seen from her MD's office. Recent cold symptoms. She had a negative flu test today.

## 2016-11-15 NOTE — ED Notes (Signed)
Pt's SpO2 remained between 95% and 98% when walking

## 2016-11-15 NOTE — Discharge Instructions (Signed)
Medications: Albuterol inhaler, Zithromax, Tessalon  Treatment: Use albuterol inhaler every 4-6 hours as needed for shortness of breath or wheezing. Take Tessalon every 8 hours as needed for cough. Take Zithromax as prescribed. You may find it useful to take a probiotic to avoid stomach upset.  Follow-up: Please follow up with your doctor if your symptoms are not improving throughout the week. Please return to emergency department if you develop any new or worsening symptoms.

## 2016-11-15 NOTE — ED Notes (Signed)
ED Provider at bedside. Patient given Sprite Zero.

## 2016-11-17 LAB — URINE CULTURE
Culture: <10000 — AB
Special Requests: NORMAL

## 2016-11-25 ENCOUNTER — Ambulatory Visit: Payer: Medicare Other | Admitting: Podiatry

## 2016-12-02 ENCOUNTER — Other Ambulatory Visit: Payer: Self-pay | Admitting: Family Medicine

## 2016-12-07 ENCOUNTER — Encounter: Payer: Self-pay | Admitting: Family Medicine

## 2016-12-07 ENCOUNTER — Ambulatory Visit (INDEPENDENT_AMBULATORY_CARE_PROVIDER_SITE_OTHER): Payer: Medicare Other | Admitting: Family Medicine

## 2016-12-07 VITALS — BP 162/84 | HR 77 | Temp 98.1°F | Ht 63.0 in | Wt 277.4 lb

## 2016-12-07 DIAGNOSIS — J9801 Acute bronchospasm: Secondary | ICD-10-CM | POA: Diagnosis not present

## 2016-12-07 DIAGNOSIS — J40 Bronchitis, not specified as acute or chronic: Secondary | ICD-10-CM

## 2016-12-07 MED ORDER — AZITHROMYCIN 250 MG PO TABS: 250.0000 mg | ORAL_TABLET | Freq: Every day | ORAL | 0 refills | Status: DC

## 2016-12-07 MED ORDER — BECLOMETHASONE DIPROPIONATE 40 MCG/ACT IN AERS: 2.0000 | INHALATION_SPRAY | Freq: Two times a day (BID) | RESPIRATORY_TRACT | 5 refills | Status: DC

## 2016-12-07 NOTE — Progress Notes (Signed)
Pre visit review using our clinic review tool, if applicable. No additional management support is needed unless otherwise documented below in the visit note. 

## 2016-12-07 NOTE — Patient Instructions (Signed)

## 2016-12-07 NOTE — Progress Notes (Signed)
Patient ID: Andrea Marks, female   DOB: 12-30-1950, 66 y.o.   MRN: [id number]   Subjective:    Patient ID: Andrea Marks, female    DOB: 09/18/1951, 66 y.o.   MRN: [id number]  Chief Complaint  Patient presents with  . Cough  . Nasal Congestion    Cough  This is a recurrent problem. The current episode started 1 to 4 weeks ago. The problem has been unchanged. The cough is non-productive. Pertinent negatives include no chest pain, fever, headaches, rash or shortness of breath.    Patient is in today for a follow up cough and congestion. Patient has a non-productive cough for the past month. Patient also states that has some chest congestion. Patient also fell on 12/03/2016.  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 Raiford Noble, [place]   Past Medical History:  Diagnosis Date  . Depression   . Diabetes mellitus   . GERD (gastroesophageal reflux disease)   . Hyperlipidemia   . Hypertension   . Obesity     Past Surgical History:  Procedure Laterality Date  . ABDOMINAL HYSTERECTOMY  1991   BSO    Family History  Problem Relation Age of Onset  . Macular degeneration Mother   . Heart disease Mother     syncope  . Lung disease Father 69    mesothelioma  . Cancer Maternal Aunt     stomach  . Heart disease Paternal Uncle     cabg  . Diabetes Paternal Uncle   . Heart disease Paternal Uncle   . Diabetes Paternal Uncle   . Diabetes Paternal Uncle   . Heart disease Paternal Uncle   . Heart disease Paternal Uncle   . Heart disease Paternal Uncle   . Lung cancer      asbestos  . Diabetes Paternal Grandmother   . Cancer Other     lung    Social History   Social History  . Marital status: Married    Spouse name: N/A  . Number of children: 0  . Years of education: 87   Occupational History  . retired from [employer] Retired   Social History Main Topics  . Smoking status: Former Research scientist (life sciences)  . Smokeless tobacco: Never Used  . Alcohol use No  . Drug use: No    . Sexual activity: Yes    Partners: Male   Other Topics Concern  . Not on file   Social History Narrative   No reg exercise    Outpatient Medications Prior to Visit  Medication Sig Dispense Refill  . acetaminophen (TYLENOL ARTHRITIS PAIN) 650 MG CR tablet Take 650 mg by mouth 2 (two) times daily as needed. For pain    . aspirin 81 MG tablet Take 81 mg by mouth daily.      . benazepril (LOTENSIN) 20 MG tablet TAKE 1 TABLET DAILY 90 tablet 1  . benzonatate (TESSALON) 100 MG capsule Take 1 capsule (100 mg total) by mouth every 8 (eight) hours. 21 capsule 0  . Bioflavonoid Products (ESTER C PO) Take 500 mg by mouth daily.    . Blood Glucose Monitoring Suppl (ONE TOUCH ULTRA SYSTEM KIT) W/DEVICE KIT 1 kit by Does not apply route once. 1 each 0  . doxycycline (VIBRA-TABS) 100 MG tablet Take 1 tablet (100 mg total) by mouth 2 (two) times daily. 20 tablet 0  . Dulaglutide (TRULICITY) 1.5 UQ/3.3HL SOPN INJECT 1.5 MG INTO THE SKIN ONCE A WEEK 12 pen 1  .  ezetimibe-simvastatin (VYTORIN) 10-40 MG tablet TAKE 1 TABLET AT BEDTIME (REPEAT LABS ARE DUE NOW) 90 tablet 0  . fenofibrate 160 MG tablet TAKE 1 TABLET DAILY 90 tablet 0  . FLUoxetine (PROZAC) 20 MG capsule TAKE 3 CAPSULES DAILY 270 capsule 0  . glimepiride (AMARYL) 4 MG tablet TAKE 1 TABLET TWICE A DAY (NEED APPOINTMENT FOR FURTHER REFILLS) 180 tablet 0  . HUMALOG KWIKPEN 100 UNIT/ML KiwkPen INJECT 10 UNITS BEFORE BREAKFAST, 10 UNITS BEFORE LUNCH AND 18 UNITS BEFORE SUPPER DAILY 45 mL 0  . Insulin Glargine (LANTUS SOLOSTAR) 100 UNIT/ML Solostar Pen 45 units subcue daily at Bedtime 30 mL 1  . insulin lispro (HUMALOG KWIKPEN) 100 UNIT/ML KiwkPen Inject 10 units before breakfast, 10 units before lunch and 18 units before supper daily. 45 mL 1  . meloxicam (MOBIC) 15 MG tablet Take 1 tablet by mouth daily as needed.  0  . MENEST 1.25 MG TABS TAKE 2 TABLETS EVERY OTHER DAY 90 tablet 1  . metFORMIN (GLUCOPHAGE-XR) 750 MG 24 hr tablet Take 1 tablet  (750 mg total) by mouth 3 (three) times daily. 270 tablet 1  . Misc Natural Products (LUTEIN VISION BLEND) CAPS Take 1 capsule by mouth daily.    Marland Kitchen NOVOFINE 32G X 6 MM MISC USE 1 NEEDLE UNDER THE SKIN DAILY 100 each 1  . ONE TOUCH ULTRA TEST test strip USE TO TEST BLOOD SUGAR 2 TIMES DAILY AS INSTRUCTED. DX: E11.65 50 each 3  . ONE TOUCH ULTRA TEST test strip USE TO TEST BLOOD SUGAR TWO TIMES DAILY AS INSTRUCTED 200 each 0  . ONETOUCH DELICA LANCETS FINE MISC USE AS INSTRUCTED 1 each 9  . ranitidine (ZANTAC) 300 MG capsule TAKE 1 CAPSULE DAILY 90 capsule 3  . azithromycin (ZITHROMAX) 250 MG tablet Take 1 tablet (250 mg total) by mouth daily. Take first 2 tablets together, then 1 every day until finished. 6 tablet 0   Facility-Administered Medications Prior to Visit  Medication Dose Route Frequency Provider Last Rate Last Dose  . ipratropium-albuterol (DUONEB) 0.5-2.5 (3) MG/3ML nebulizer solution 3 mL  3 mL Nebulization Q6H Jessica C Copland, MD   3 mL at 11/15/16 1330    Allergies  Allergen Reactions  . Hydrocodone-Acetaminophen Other (See Comments)    unknown  . Oxycodone Hcl Other (See Comments)    unknown  . Penicillins Other (See Comments)    unknown  . Prednisone     Review of Systems  Constitutional: Negative for fever and malaise/fatigue.  HENT: Negative for congestion.   Eyes: Negative for blurred vision.  Respiratory: Negative for cough and shortness of breath.   Cardiovascular: Negative for chest pain, palpitations and leg swelling.  Gastrointestinal: Negative for vomiting.  Musculoskeletal: Negative for back pain.  Skin: Negative for rash.  Neurological: Negative for loss of consciousness and headaches.       Objective:    Physical Exam  Constitutional: She is oriented to person, place, and time. She appears well-developed and well-nourished.  HENT:  Right Ear: External ear normal.  Left Ear: External ear normal.  + PND + errythema  Eyes: Conjunctivae are  normal. Right eye exhibits no discharge. Left eye exhibits no discharge.  Cardiovascular: Normal rate, regular rhythm and normal heart sounds.   No murmur heard. Pulmonary/Chest: Effort normal. No respiratory distress. She has wheezes. She has no rales. She exhibits no tenderness.  Musculoskeletal: She exhibits no edema.  Lymphadenopathy:    She has cervical adenopathy.  Neurological: She is alert  and oriented to person, place, and time.  Nursing note and vitals reviewed.   BP (!) 162/84 (BP Location: Left Arm, Patient Position: Sitting, Cuff Size: Large)   Pulse 77   Temp 98.1 F (36.7 C) (Oral)   Ht 5' 3" (1.6 m)   Wt 277 lb 6.4 oz (125.8 kg)   SpO2 95% Comment: RA  BMI 49.14 kg/m  Wt Readings from Last 3 Encounters:  12/07/16 277 lb 6.4 oz (125.8 kg)  11/15/16 266 lb (120.7 kg)  11/15/16 266 lb 12.8 oz (121 kg)     Lab Results  Component Value Date   WBC 6.1 11/15/2016   HGB 14.6 11/15/2016   HCT 45.9 11/15/2016   PLT 201 11/15/2016   GLUCOSE 459 (H) 11/15/2016   CHOL 165 06/11/2016   TRIG 175.0 (H) 06/11/2016   HDL 67.40 06/11/2016   LDLCALC 62 06/11/2016   ALT 24 11/15/2016   AST 26 11/15/2016   NA 133 (L) 11/15/2016   K 4.3 11/15/2016   CL 97 (L) 11/15/2016   CREATININE 0.96 11/15/2016   BUN 13 11/15/2016   CO2 24 11/15/2016   TSH 3.22 08/07/2015   INR 1.2 RATIO 04/19/2007   HGBA1C 7.8 (H) 06/11/2016   MICROALBUR 0.9 08/07/2015    Lab Results  Component Value Date   TSH 3.22 08/07/2015   Lab Results  Component Value Date   WBC 6.1 11/15/2016   HGB 14.6 11/15/2016   HCT 45.9 11/15/2016   MCV 88.6 11/15/2016   PLT 201 11/15/2016   Lab Results  Component Value Date   NA 133 (L) 11/15/2016   K 4.3 11/15/2016   CO2 24 11/15/2016   GLUCOSE 459 (H) 11/15/2016   BUN 13 11/15/2016   CREATININE 0.96 11/15/2016   BILITOT 0.8 11/15/2016   ALKPHOS 44 11/15/2016   AST 26 11/15/2016   ALT 24 11/15/2016   PROT 6.8 11/15/2016   ALBUMIN 3.4 (L)  11/15/2016   CALCIUM 9.0 11/15/2016   ANIONGAP 12 11/15/2016   GFR 86.32 06/11/2016   Lab Results  Component Value Date   CHOL 165 06/11/2016   Lab Results  Component Value Date   HDL 67.40 06/11/2016   Lab Results  Component Value Date   LDLCALC 62 06/11/2016   Lab Results  Component Value Date   TRIG 175.0 (H) 06/11/2016   Lab Results  Component Value Date   CHOLHDL 2 06/11/2016   Lab Results  Component Value Date   HGBA1C 7.8 (H) 06/11/2016       Assessment & Plan:   Problem List Items Addressed This Visit    None    Visit Diagnoses    Bronchospasm    -  Primary   Relevant Medications   beclomethasone (QVAR) 40 MCG/ACT inhaler   Bronchitis       Relevant Medications   azithromycin (ZITHROMAX) 250 MG tablet      I am having Ms. Monterrosa start on beclomethasone. I am also having her maintain her acetaminophen, aspirin, NOVOFINE, ONE TOUCH ULTRA SYSTEM KIT, ONETOUCH DELICA LANCETS FINE, meloxicam, ONE TOUCH ULTRA TEST, ONE TOUCH ULTRA TEST, LUTEIN VISION BLEND, Bioflavonoid Products (ESTER C PO), ezetimibe-simvastatin, metFORMIN, insulin lispro, MENEST, benazepril, ranitidine, doxycycline, HUMALOG KWIKPEN, glimepiride, FLUoxetine, Insulin Glargine, Dulaglutide, benzonatate, fenofibrate, and azithromycin. We will continue to administer ipratropium-albuterol.  Meds ordered this encounter  Medications  . beclomethasone (QVAR) 40 MCG/ACT inhaler    Sig: Inhale 2 puffs into the lungs 2 (two) times daily.  Dispense:  1 Inhaler    Refill:  5  . DISCONTD: azithromycin (ZITHROMAX) 250 MG tablet    Sig: Take 1 tablet (250 mg total) by mouth daily. Take first 2 tablets together, then 1 every day until finished.    Dispense:  6 tablet    Refill:  0  . azithromycin (ZITHROMAX) 250 MG tablet    Sig: Take 1 tablet (250 mg total) by mouth daily. Take first 2 tablets together, then 1 every day until finished.    Dispense:  6 tablet    Refill:  0    CMA served as  scribe during this visit. History, Physical and Plan performed by medical provider. Documentation and orders reviewed and attested to.  [name], DO

## 2016-12-10 ENCOUNTER — Encounter: Payer: Self-pay | Admitting: Family Medicine

## 2016-12-12 ENCOUNTER — Encounter: Payer: Self-pay | Admitting: Family Medicine

## 2016-12-14 ENCOUNTER — Other Ambulatory Visit: Payer: Self-pay | Admitting: Internal Medicine

## 2016-12-16 ENCOUNTER — Encounter: Payer: Self-pay | Admitting: Podiatry

## 2016-12-16 ENCOUNTER — Ambulatory Visit (INDEPENDENT_AMBULATORY_CARE_PROVIDER_SITE_OTHER): Payer: Medicare Other | Admitting: Podiatry

## 2016-12-16 VITALS — Ht 63.0 in | Wt 277.0 lb

## 2016-12-16 DIAGNOSIS — B351 Tinea unguium: Secondary | ICD-10-CM

## 2016-12-16 DIAGNOSIS — Z794 Long term (current) use of insulin: Secondary | ICD-10-CM

## 2016-12-16 DIAGNOSIS — M79676 Pain in unspecified toe(s): Secondary | ICD-10-CM | POA: Diagnosis not present

## 2016-12-16 DIAGNOSIS — E1151 Type 2 diabetes mellitus with diabetic peripheral angiopathy without gangrene: Secondary | ICD-10-CM

## 2016-12-16 NOTE — Progress Notes (Signed)
Patient ID: Andrea SaleRoberta Casady, female   DOB: 03/16/1951, 66 y.o.   MRN: [id number] Complaint:  Visit Type: Patient returns to my office for continued preventative foot care services. Complaint: Patient states" my nails have grown long and thick and become painful to walk and wear shoes".  Painful callus on bottom of both big toes. Patient has been diagnosed with DM with no foot complications. The patient presents for preventative foot care services. No changes to ROS  Podiatric Exam: Vascular: dorsalis pedis and posterior tibial pulses are not palpable bilateral due to foot swelling.. Capillary return is immediate. Temperature gradient is WNL. Skin turgor WNL  Sensorium: Diminished Semmes Weinstein monofilament test. Normal tactile sensation bilaterally. Nail Exam: Pt has thick disfigured discolored nails with subungual debris noted bilateral entire nail hallux through fifth toenails Ulcer Exam: There is no evidence of ulcer or pre-ulcerative changes or infection. Orthopedic Exam: Muscle tone and strength are WNL. No limitations in general ROM. No crepitus or effusions noted. Foot type and digits show no abnormalities. Bony prominences are unremarkable. Skin: No Porokeratosis. No infection or ulcers.  Pinch callus hallux B/L.  Diagnosis:  Onychomycosis, , Pain in right toe, pain in left toes,  Callus B/L  Treatment & Plan Procedures and Treatment: Consent by patient was obtained for treatment procedures. The patient understood the discussion of treatment and procedures well. All questions were answered thoroughly reviewed. Debridement of mycotic and hypertrophic toenails, 1 through 5 bilateral and clearing of subungual debris. No ulceration, no infection noted. Return Visit-Office Procedure: Patient instructed to return to the office for a follow up visit 3 months for continued evaluation and treatment.   Helane GuntherGregory Gustavus Haskin DPM

## 2016-12-21 ENCOUNTER — Other Ambulatory Visit: Payer: Self-pay | Admitting: Family Medicine

## 2016-12-21 ENCOUNTER — Encounter: Payer: Self-pay | Admitting: Family Medicine

## 2016-12-21 DIAGNOSIS — IMO0002 Reserved for concepts with insufficient information to code with codable children: Secondary | ICD-10-CM

## 2016-12-21 DIAGNOSIS — E1165 Type 2 diabetes mellitus with hyperglycemia: Principal | ICD-10-CM

## 2016-12-21 DIAGNOSIS — E1151 Type 2 diabetes mellitus with diabetic peripheral angiopathy without gangrene: Secondary | ICD-10-CM

## 2016-12-21 MED ORDER — DULAGLUTIDE 1.5 MG/0.5ML ~~LOC~~ SOAJ: SUBCUTANEOUS | 1 refills | Status: DC

## 2016-12-23 ENCOUNTER — Encounter: Payer: Self-pay | Admitting: Family Medicine

## 2016-12-24 ENCOUNTER — Other Ambulatory Visit: Payer: Self-pay | Admitting: Family Medicine

## 2016-12-24 DIAGNOSIS — E1165 Type 2 diabetes mellitus with hyperglycemia: Principal | ICD-10-CM

## 2016-12-24 DIAGNOSIS — IMO0002 Reserved for concepts with insufficient information to code with codable children: Secondary | ICD-10-CM

## 2016-12-24 DIAGNOSIS — E1151 Type 2 diabetes mellitus with diabetic peripheral angiopathy without gangrene: Secondary | ICD-10-CM

## 2016-12-24 MED ORDER — METFORMIN HCL ER 750 MG PO TB24: 750.0000 mg | ORAL_TABLET | Freq: Three times a day (TID) | ORAL | 1 refills | Status: DC

## 2016-12-28 LAB — HM DIABETES EYE EXAM

## 2016-12-29 ENCOUNTER — Telehealth: Payer: Self-pay | Admitting: *Deleted

## 2016-12-29 NOTE — Telephone Encounter (Signed)
AWV scheduled 01/14/17 @1 .

## 2017-01-04 ENCOUNTER — Telehealth: Payer: Self-pay | Admitting: Family Medicine

## 2017-01-04 MED ORDER — BECLOMETHASONE DIPROP HFA 40 MCG/ACT IN AERB: 2.0000 | INHALATION_SPRAY | Freq: Two times a day (BID) | RESPIRATORY_TRACT | 6 refills | Status: DC

## 2017-01-04 NOTE — Telephone Encounter (Signed)
Fax from pharmacy to inform QVAR HFA inhalers are being discontinued. Replaced with QVAR HFA REDIHALER available in 40 mcg and 80 mcg?? Needs a new prescription if ok to change. [facility] [place].

## 2017-01-04 NOTE — Telephone Encounter (Signed)
Ok to change

## 2017-01-04 NOTE — Telephone Encounter (Signed)
Changed to new alternative and sent to pharmacy

## 2017-01-10 ENCOUNTER — Encounter: Payer: Self-pay | Admitting: Family Medicine

## 2017-01-11 ENCOUNTER — Other Ambulatory Visit: Payer: Self-pay | Admitting: Family Medicine

## 2017-01-11 DIAGNOSIS — IMO0002 Reserved for concepts with insufficient information to code with codable children: Secondary | ICD-10-CM

## 2017-01-11 DIAGNOSIS — E1165 Type 2 diabetes mellitus with hyperglycemia: Principal | ICD-10-CM

## 2017-01-11 DIAGNOSIS — E1151 Type 2 diabetes mellitus with diabetic peripheral angiopathy without gangrene: Secondary | ICD-10-CM

## 2017-01-11 MED ORDER — INSULIN GLARGINE 100 UNIT/ML SOLOSTAR PEN: PEN_INJECTOR | SUBCUTANEOUS | 1 refills | Status: DC

## 2017-01-12 NOTE — Progress Notes (Deleted)
Pre visit review using our clinic review tool, if applicable. No additional management support is needed unless otherwise documented below in the visit note. 

## 2017-01-12 NOTE — Progress Notes (Deleted)
Subjective:   Andrea Marks is a 66 y.o. female who presents for an Initial Medicare Annual Wellness Visit.  Review of Systems       Sleep patterns: {SX; SLEEP PATTERNS:18802::"feels rested on waking","does not get up to void","gets up *** times nightly to void","sleeps *** hours nightly"}.   Home Safety/Smoke Alarms:   Living environment; residence and Firearm Safety: {Rehab home environment / accessibility:30080::"no firearms","firearms stored safely"}. Seat Belt Safety/Bike Helmet: Wears seat belt.   Counseling:   Eye Exam-  Dental-  Female:   Pap-   Hysterectomy.    Mammo- Last  02/25/11: ASSESSMENT: Negative - BI-RADS 1      Dexa scan-        [facility]-     Objective:    There were no vitals filed for this visit. There is no height or weight on file to calculate BMI.   Current Medications (verified) Outpatient Encounter Prescriptions as of 01/14/2017  Medication Sig  . acetaminophen (TYLENOL ARTHRITIS PAIN) 650 MG CR tablet Take 650 mg by mouth 2 (two) times daily as needed. For pain  . aspirin 81 MG tablet Take 81 mg by mouth daily.    Marland Kitchen azithromycin (ZITHROMAX) 250 MG tablet Take 1 tablet (250 mg total) by mouth daily. Take first 2 tablets together, then 1 every day until finished.  . Beclomethasone Diprop HFA (QVAR REDIHALER) 40 MCG/ACT AERB Inhale 2 puffs into the lungs 2 (two) times daily.  . benazepril (LOTENSIN) 20 MG tablet TAKE 1 TABLET DAILY  . benzonatate (TESSALON) 100 MG capsule Take 1 capsule (100 mg total) by mouth every 8 (eight) hours.  Marland Kitchen Bioflavonoid Products (ESTER C PO) Take 500 mg by mouth daily.  . Blood Glucose Monitoring Suppl (ONE TOUCH ULTRA SYSTEM KIT) W/DEVICE KIT 1 kit by Does not apply route once.  . doxycycline (VIBRA-TABS) 100 MG tablet Take 1 tablet (100 mg total) by mouth 2 (two) times daily.  . Dulaglutide (TRULICITY) 1.5 ON/6.2XB SOPN INJECT 1.5 MG INTO THE SKIN ONCE A WEEK  . ezetimibe-simvastatin (VYTORIN) 10-40 MG tablet TAKE 1  TABLET AT BEDTIME (REPEAT LABS ARE DUE NOW)  . fenofibrate 160 MG tablet TAKE 1 TABLET DAILY  . FLUoxetine (PROZAC) 20 MG capsule TAKE 3 CAPSULES DAILY  . glimepiride (AMARYL) 4 MG tablet TAKE 1 TABLET TWICE A DAY (NEED APPOINTMENT FOR FURTHER REFILLS)  . HUMALOG KWIKPEN 100 UNIT/ML KiwkPen INJECT 10 UNITS BEFORE BREAKFAST, 10 UNITS BEFORE LUNCH AND 18 UNITS BEFORE SUPPER DAILY  . Insulin Glargine (LANTUS SOLOSTAR) 100 UNIT/ML Solostar Pen 45 units subcue daily at Bedtime  . insulin lispro (HUMALOG KWIKPEN) 100 UNIT/ML KiwkPen Inject 10 units before breakfast, 10 units before lunch and 18 units before supper daily.  . meloxicam (MOBIC) 15 MG tablet Take 1 tablet by mouth daily as needed.  . MENEST 1.25 MG TABS TAKE 2 TABLETS EVERY OTHER DAY  . metFORMIN (GLUCOPHAGE-XR) 750 MG 24 hr tablet Take 1 tablet (750 mg total) by mouth 3 (three) times daily.  . Misc Natural Products (LUTEIN VISION BLEND) CAPS Take 1 capsule by mouth daily.  Marland Kitchen NOVOFINE 32G X 6 MM MISC USE 1 NEEDLE UNDER THE SKIN DAILY  . ONE TOUCH ULTRA TEST test strip USE TO TEST BLOOD SUGAR 2 TIMES DAILY AS INSTRUCTED. DX: E11.65  . ONE TOUCH ULTRA TEST test strip USE TO TEST BLOOD SUGAR TWO TIMES DAILY AS INSTRUCTED  . ONETOUCH DELICA LANCETS FINE MISC USE AS INSTRUCTED  . ranitidine (ZANTAC) 300 MG capsule  TAKE 1 CAPSULE DAILY   Facility-Administered Encounter Medications as of 01/14/2017  Medication  . ipratropium-albuterol (DUONEB) 0.5-2.5 (3) MG/3ML nebulizer solution 3 mL    Allergies (verified) Hydrocodone-acetaminophen; Oxycodone hcl; Penicillins; and Prednisone   History: Past Medical History:  Diagnosis Date  . Depression   . Diabetes mellitus   . GERD (gastroesophageal reflux disease)   . Hyperlipidemia   . Hypertension   . Obesity    Past Surgical History:  Procedure Laterality Date  . ABDOMINAL HYSTERECTOMY  1991   BSO   Family History  Problem Relation Age of Onset  . Macular degeneration Mother   .  Heart disease Mother     syncope  . Lung disease Father 33    mesothelioma  . Cancer Maternal Aunt     stomach  . Heart disease Paternal Uncle     cabg  . Diabetes Paternal Uncle   . Heart disease Paternal Uncle   . Diabetes Paternal Uncle   . Diabetes Paternal Uncle   . Heart disease Paternal Uncle   . Heart disease Paternal Uncle   . Heart disease Paternal Uncle   . Lung cancer      asbestos  . Diabetes Paternal Grandmother   . Cancer Other     lung   Social History   Occupational History  . retired from [employer] Retired   Social History Main Topics  . Smoking status: Former Research scientist (life sciences)  . Smokeless tobacco: Never Used  . Alcohol use No  . Drug use: No  . Sexual activity: Yes    Partners: Male    Tobacco Counseling Counseling given: Not Answered   Activities of Daily Living No flowsheet data found.  Immunizations and Health Maintenance Immunization History  Administered Date(s) Administered  . Pneumococcal Conjugate-13 08/27/2016  . Pneumococcal Polysaccharide-23 05/08/2003, 11/10/2009  . Td 05/08/2003  . Tdap 06/08/2011  . Zoster 02/10/2012   Health Maintenance Due  Topic Date Due  . MAMMOGRAM  02/23/2013  . DEXA SCAN  02/19/2016  . FOOT EXAM  10/06/2016  . OPHTHALMOLOGY EXAM  10/13/2016  . HEMOGLOBIN A1C  12/12/2016    Patient Care Team: [name], DO as PCP - General Gardiner Barefoot, DPM as Consulting Physician (Podiatry)  Indicate any recent Medical Services you may have received from other than [facility] providers in the past year (date may be approximate).     Assessment:   This is a routine wellness examination for Andrea Marks. Physical assessment deferred to PCP.  Hearing/Vision screen No exam data present  Dietary issues and exercise activities discussed:   Diet (meal preparation, eat out, water intake, caffeinated beverages, dairy products, fruits and vegetables): {Desc; diets:16563} Breakfast: Lunch:  Dinner:      Goals     None     Depression Screen PHQ 2/9 Scores 06/11/2016  PHQ - 2 Score 5  PHQ- 9 Score 17    Fall Risk Fall Risk  12/07/2016 06/11/2016  Falls in the past year? Yes No  Number falls in past yr: 1 -  Injury with Fall? Yes -    Cognitive Function:        Screening Tests Health Maintenance  Topic Date Due  . MAMMOGRAM  02/23/2013  . DEXA SCAN  02/19/2016  . FOOT EXAM  10/06/2016  . OPHTHALMOLOGY EXAM  10/13/2016  . HEMOGLOBIN A1C  12/12/2016  . Hepatitis C Screening  06/11/2017 (Originally Jun 28, 1951)  . HIV Screening  06/11/2017 (Originally 02[birth year]/1966)  . INFLUENZA VACCINE  08/27/2017 (Originally 06/01/2016)  . PNA vac Low Risk Adult (2 of 2 - PPSV23) 08/27/2017  . COLONOSCOPY  02/16/2021  . TETANUS/TDAP  06/07/2021      Plan:   ***  During the course of the visit, Andrea Marks was educated and counseled about the following appropriate screening and preventive services:   Vaccines to include Pneumoccal, Influenza, Hepatitis B, Td, HCV  Cardiovascular disease screening  Colorectal cancer screening  Bone density screening  Diabetes screening  Glaucoma screening  Mammography/PAP  Nutrition counseling  Smoking cessation counseling  Patient Instructions (the written plan) were given to the patient.    Shela Nevin, [place]   01/12/2017

## 2017-01-13 ENCOUNTER — Encounter: Payer: Self-pay | Admitting: Family Medicine

## 2017-01-14 ENCOUNTER — Ambulatory Visit: Payer: Medicare Other | Admitting: *Deleted

## 2017-01-14 NOTE — Telephone Encounter (Signed)
Did they give her any alt suggestions?  We will need her formulary

## 2017-01-19 NOTE — Progress Notes (Signed)
Pre visit review using our clinic review tool, if applicable. No additional management support is needed unless otherwise documented below in the visit note. 

## 2017-01-19 NOTE — Progress Notes (Addendum)
 Subjective:   Andrea Marks is a 65 y.o. female who presents for an Initial Medicare Annual Wellness Visit.  Review of Systems    No ROS.  Medicare Wellness Visit. Cardiac Risk Factors include: advanced age (>55men, >65 women);diabetes mellitus;dyslipidemia;hypertension;obesity (BMI >30kg/m2);sedentary lifestyle Sleep patterns:  Wakes 1-2 times to urinate. Sleeps about 5 hrs per night. Naps occasionally.  Home Safety/Smoke Alarms: Feels safe in home. Smoke alarms in place.   Living environment; residence and Firearm Safety: Lives at home with husband in 2 story home. No guns. Seat Belt Safety/Bike Helmet: Wears seat belt.   Counseling:   Eye Exam- Dr.Hollander annually for glasses and eye exams.   Dental- Dentist every 6 months.  Female:   Pap- Hysterectomy.      Mammo- Last 02/24/11:Negative - BI-RADS 1 . Pt declines.    Dexa scan- pt declines.       CCS- pt declines.    Objective:    Today's Vitals   01/24/17 1004  BP: 116/66  Pulse: 76  SpO2: 98%  Weight: 274 lb (124.3 kg)  Height: 5' 3" (1.6 m)   Body mass index is 48.54 kg/m.   Current Medications (verified) Outpatient Encounter Prescriptions as of 01/24/2017  Medication Sig  . acetaminophen (TYLENOL ARTHRITIS PAIN) 650 MG CR tablet Take 650 mg by mouth 2 (two) times daily as needed. For pain  . aspirin 81 MG tablet Take 81 mg by mouth daily.    . benazepril (LOTENSIN) 20 MG tablet TAKE 1 TABLET DAILY  . Dulaglutide (TRULICITY) 1.5 MG/0.5ML SOPN INJECT 1.5 MG INTO THE SKIN ONCE A WEEK  . ezetimibe-simvastatin (VYTORIN) 10-40 MG tablet TAKE 1 TABLET AT BEDTIME (REPEAT LABS ARE DUE NOW)  . fenofibrate 160 MG tablet TAKE 1 TABLET DAILY  . FLUoxetine (PROZAC) 20 MG capsule TAKE 3 CAPSULES DAILY  . glimepiride (AMARYL) 4 MG tablet TAKE 1 TABLET TWICE A DAY (NEED APPOINTMENT FOR FURTHER REFILLS)  . HUMALOG KWIKPEN 100 UNIT/ML KiwkPen INJECT 10 UNITS BEFORE BREAKFAST, 10 UNITS BEFORE LUNCH AND 18 UNITS BEFORE SUPPER  DAILY  . Insulin Glargine (LANTUS SOLOSTAR) 100 UNIT/ML Solostar Pen 45 units subcue daily at Bedtime  . insulin lispro (HUMALOG KWIKPEN) 100 UNIT/ML KiwkPen Inject 10 units before breakfast, 10 units before lunch and 18 units before supper daily.  . MENEST 1.25 MG TABS TAKE 2 TABLETS EVERY OTHER DAY  . metFORMIN (GLUCOPHAGE-XR) 750 MG 24 hr tablet Take 1 tablet (750 mg total) by mouth 3 (three) times daily.  . Misc Natural Products (LUTEIN VISION BLEND) CAPS Take 1 capsule by mouth daily.  . NOVOFINE 32G X 6 MM MISC USE 1 NEEDLE UNDER THE SKIN DAILY  . ONE TOUCH ULTRA TEST test strip USE TO TEST BLOOD SUGAR 2 TIMES DAILY AS INSTRUCTED. DX: E11.65  . ONE TOUCH ULTRA TEST test strip USE TO TEST BLOOD SUGAR TWO TIMES DAILY AS INSTRUCTED  . ONETOUCH DELICA LANCETS FINE MISC USE AS INSTRUCTED  . ranitidine (ZANTAC) 300 MG capsule TAKE 1 CAPSULE DAILY  . Beclomethasone Diprop HFA (QVAR REDIHALER) 40 MCG/ACT AERB Inhale 2 puffs into the lungs 2 (two) times daily. (Patient not taking: Reported on 01/24/2017)  . Bioflavonoid Products (ESTER C PO) Take 500 mg by mouth daily.  . Blood Glucose Monitoring Suppl (ONE TOUCH ULTRA SYSTEM KIT) W/DEVICE KIT 1 kit by Does not apply route once. (Patient not taking: Reported on 01/24/2017)  . meloxicam (MOBIC) 15 MG tablet Take 1 tablet by mouth daily as needed.  . [  DISCONTINUED] azithromycin (ZITHROMAX) 250 MG tablet Take 1 tablet (250 mg total) by mouth daily. Take first 2 tablets together, then 1 every day until finished.  . [DISCONTINUED] benzonatate (TESSALON) 100 MG capsule Take 1 capsule (100 mg total) by mouth every 8 (eight) hours.  . [DISCONTINUED] doxycycline (VIBRA-TABS) 100 MG tablet Take 1 tablet (100 mg total) by mouth 2 (two) times daily.   Facility-Administered Encounter Medications as of 01/24/2017  Medication  . ipratropium-albuterol (DUONEB) 0.5-2.5 (3) MG/3ML nebulizer solution 3 mL    Allergies (verified) Hydrocodone-acetaminophen; Oxycodone  hcl; Penicillins; and Prednisone   History: Past Medical History:  Diagnosis Date  . Depression   . Diabetes mellitus   . GERD (gastroesophageal reflux disease)   . Hyperlipidemia   . Hypertension   . Obesity    Past Surgical History:  Procedure Laterality Date  . ABDOMINAL HYSTERECTOMY  1991   BSO   Family History  Problem Relation Age of Onset  . Macular degeneration Mother   . Heart disease Mother     syncope  . Lung disease Father 77    mesothelioma  . Cancer Maternal Aunt     stomach  . Heart disease Paternal Uncle     cabg  . Diabetes Paternal Uncle   . Heart disease Paternal Uncle   . Diabetes Paternal Uncle   . Diabetes Paternal Uncle   . Heart disease Paternal Uncle   . Heart disease Paternal Uncle   . Heart disease Paternal Uncle   . Lung cancer      asbestos  . Diabetes Paternal Grandmother   . Cancer Other     lung   Social History   Occupational History  . retired from [employer] Retired   Social History Main Topics  . Smoking status: Former Research scientist (life sciences)  . Smokeless tobacco: Never Used  . Alcohol use No  . Drug use: No  . Sexual activity: Yes    Partners: Male    Tobacco Counseling Counseling given: No   Activities of Daily Living In your present state of health, do you have any difficulty performing the following activities: 01/24/2017  Hearing? N  Vision? N  Difficulty concentrating or making decisions? N  Walking or climbing stairs? Y  Dressing or bathing? N  Doing errands, shopping? N  Preparing Food and eating ? N  Using the Toilet? N  In the past six months, have you accidently leaked urine? N  Do you have problems with loss of bowel control? N  Managing your Medications? N  Managing your Finances? N  Housekeeping or managing your Housekeeping? N  Some recent data might be hidden    Immunizations and Health Maintenance Immunization History  Administered Date(s) Administered  . Pneumococcal Conjugate-13 08/27/2016  .  Pneumococcal Polysaccharide-23 05/08/2003, 11/10/2009  . Td 05/08/2003  . Tdap 06/08/2011  . Zoster 02/10/2012   Health Maintenance Due  Topic Date Due  . MAMMOGRAM  02/23/2013  . DEXA SCAN  02/19/2016  . FOOT EXAM  10/06/2016  . HEMOGLOBIN A1C  12/12/2016    Patient Care Team: [name], DO as PCP - General Gardiner Barefoot, DPM as Consulting Physician (Podiatry)  Indicate any recent Medical Services you may have received from other than [facility] providers in the past year (date may be approximate).     Assessment:   This is a routine wellness examination for Andrea Marks. Physical assessment deferred to PCP.  Hearing/Vision screen Hearing Screening Comments: Able to hear conversational tones w/o difficulty.  No issues reported.   Vision Screening Comments: Pt states she just had vision exam and received new glasses from Dr.Hollander.  Dietary issues and exercise activities discussed: Current Exercise Habits: The patient does not participate in regular exercise at present, Exercise limited by: None identified   Diet (meal preparation, eat out, water intake, caffeinated beverages, dairy products, fruits and vegetables): in general, an "unhealthy" diet  24 HOUR RECALL: Breakfast: Often skips or might have a bagel Lunch: Fast food. Dinner:  Hamburger and vegetable. Drinks 8 glasses of water per day. Drinks several diet sodas per day.  Goals      Patient Stated   . <enter goal here> (pt-stated)          Maintain current lifestyle.      Depression Screen PHQ 2/9 Scores 01/24/2017 06/11/2016  PHQ - 2 Score 0 5  PHQ- 9 Score - 17    Fall Risk Fall Risk  01/24/2017 12/07/2016 06/11/2016  Falls in the past year? Yes Yes No  Number falls in past yr: 1 1 -  Injury with Fall? Yes Yes -    Cognitive Function: Ad8 score reviewed for issues:  Issues making decisions:no  Less interest in hobbies / activities:no  Repeats questions, stories (family complaining):no  Trouble  using ordinary gadgets (microwave, computer, phone):no  Forgets the month or year: no  Mismanaging finances:no   Remembering appts:no  Daily problems with thinking and/or memory:no Ad8 score is=0            Screening Tests Health Maintenance  Topic Date Due  . MAMMOGRAM  02/23/2013  . DEXA SCAN  02/19/2016  . FOOT EXAM  10/06/2016  . HEMOGLOBIN A1C  12/12/2016  . Hepatitis C Screening  06/11/2017 (Originally 08/01/1951)  . HIV Screening  06/11/2017 (Originally 02/18/1966)  . INFLUENZA VACCINE  08/27/2017 (Originally 06/01/2016)  . PNA vac Low Risk Adult (2 of 2 - PPSV23) 08/27/2017  . OPHTHALMOLOGY EXAM  12/28/2017  . COLONOSCOPY  02/16/2021  . TETANUS/TDAP  06/07/2021      Plan:     Follow up with Dr.Lowne as scheduled.  Eat heart healthy diet (full of fruits, vegetables, whole grains, lean protein, water--limit salt, fat, and sugar intake) and increase physical activity as tolerated.  During the course of the visit, Andrea Marks was educated and counseled about the following appropriate screening and preventive services:   Vaccines to include Pneumoccal, Influenza, Hepatitis B, Td,  HCV  Cardiovascular disease screening  Colorectal cancer screening  Bone density screening  Diabetes screening  Glaucoma screening  Mammography/PAP  Nutrition counseling   Patient Instructions (the written plan) were given to the patient.    Britt, Victoria Angel, RN   01/24/2017    Reviewed--- Yvonne R Lowne Chase, DO   

## 2017-01-24 ENCOUNTER — Ambulatory Visit (INDEPENDENT_AMBULATORY_CARE_PROVIDER_SITE_OTHER): Payer: Medicare Other | Admitting: *Deleted

## 2017-01-24 ENCOUNTER — Other Ambulatory Visit: Payer: Self-pay | Admitting: *Deleted

## 2017-01-24 ENCOUNTER — Encounter: Payer: Self-pay | Admitting: *Deleted

## 2017-01-24 VITALS — BP 116/66 | HR 76 | Ht 63.0 in | Wt 274.0 lb

## 2017-01-24 DIAGNOSIS — Z0000 Encounter for general adult medical examination without abnormal findings: Secondary | ICD-10-CM

## 2017-01-24 DIAGNOSIS — E785 Hyperlipidemia, unspecified: Secondary | ICD-10-CM

## 2017-01-24 DIAGNOSIS — R300 Dysuria: Secondary | ICD-10-CM

## 2017-01-24 MED ORDER — EZETIMIBE-SIMVASTATIN 10-40 MG PO TABS: ORAL_TABLET | ORAL | 0 refills | Status: DC

## 2017-01-24 NOTE — Patient Instructions (Signed)
Health Maintenance, Female Adopting a healthy lifestyle and getting preventive care can go a long way to promote health and wellness. Talk with your health care provider about what schedule of regular examinations is right for you. This is a good chance for you to check in with your provider about disease prevention and staying healthy. In between checkups, there are plenty of things you can do on your own. Experts have done a lot of research about which lifestyle changes and preventive measures are most likely to keep you healthy. Ask your health care provider for more information. Weight and diet Eat a healthy diet  Be sure to include plenty of vegetables, fruits, low-fat dairy products, and lean protein.  Do not eat a lot of foods high in solid fats, added sugars, or salt.  Get regular exercise. This is one of the most important things you can do for your health.  Most adults should exercise for at least 150 minutes each week. The exercise should increase your heart rate and make you sweat (moderate-intensity exercise).  Most adults should also do strengthening exercises at least twice a week. This is in addition to the moderate-intensity exercise. Maintain a healthy weight  Body mass index (BMI) is a measurement that can be used to identify possible weight problems. It estimates body fat based on height and weight. Your health care provider can help determine your BMI and help you achieve or maintain a healthy weight.  For females 62 years of age and older:  A BMI below 18.5 is considered underweight.  A BMI of 18.5 to 24.9 is normal.  A BMI of 25 to 29.9 is considered overweight.  A BMI of 30 and above is considered obese. Watch levels of cholesterol and blood lipids  You should start having your blood tested for lipids and cholesterol at 66 years of age, then have this test every 5 years.  You may need to have your cholesterol levels checked more often if:  Your lipid or  cholesterol levels are high.  You are older than 66 years of age.  You are at high risk for heart disease. Cancer screening Lung Cancer  Lung cancer screening is recommended for adults 59-21 years old who are at high risk for lung cancer because of a history of smoking.  A yearly low-dose CT scan of the lungs is recommended for people who:  Currently smoke.  Have quit within the past 15 years.  Have at least a 30-pack-year history of smoking. A pack year is smoking an average of one pack of cigarettes a day for 1 year.  Yearly screening should continue until it has been 15 years since you quit.  Yearly screening should stop if you develop a health problem that would prevent you from having lung cancer treatment. Breast Cancer  Practice breast self-awareness. This means understanding how your breasts normally appear and feel.  It also means doing regular breast self-exams. Let your health care provider know about any changes, no matter how small.  If you are in your 20s or 30s, you should have a clinical breast exam (CBE) by a health care provider every 1-3 years as part of a regular health exam.  If you are 25 or older, have a CBE every year. Also consider having a breast X-ray (mammogram) every year.  If you have a family history of breast cancer, talk to your health care provider about genetic screening.  If you are at high risk for breast cancer, talk  to your health care provider about having an MRI and a mammogram every year.  Breast cancer gene (BRCA) assessment is recommended for women who have family members with BRCA-related cancers. BRCA-related cancers include:  Breast.  Ovarian.  Tubal.  Peritoneal cancers.  Results of the assessment will determine the need for genetic counseling and BRCA1 and BRCA2 testing. Cervical Cancer  Your health care provider may recommend that you be screened regularly for cancer of the pelvic organs (ovaries, uterus, and vagina).  This screening involves a pelvic examination, including checking for microscopic changes to the surface of your cervix (Pap test). You may be encouraged to have this screening done every 3 years, beginning at age 24.  For women ages 66-65, health care providers may recommend pelvic exams and Pap testing every 3 years, or they may recommend the Pap and pelvic exam, combined with testing for human papilloma virus (HPV), every 5 years. Some types of HPV increase your risk of cervical cancer. Testing for HPV may also be done on women of any age with unclear Pap test results.  Other health care providers may not recommend any screening for nonpregnant women who are considered low risk for pelvic cancer and who do not have symptoms. Ask your health care provider if a screening pelvic exam is right for you.  If you have had past treatment for cervical cancer or a condition that could lead to cancer, you need Pap tests and screening for cancer for at least 20 years after your treatment. If Pap tests have been discontinued, your risk factors (such as having a new sexual partner) need to be reassessed to determine if screening should resume. Some women have medical problems that increase the chance of getting cervical cancer. In these cases, your health care provider may recommend more frequent screening and Pap tests. Colorectal Cancer  This type of cancer can be detected and often prevented.  Routine colorectal cancer screening usually begins at 66 years of age and continues through 66 years of age.  Your health care provider may recommend screening at an earlier age if you have risk factors for colon cancer.  Your health care provider may also recommend using home test kits to check for hidden blood in the stool.  A small camera at the end of a tube can be used to examine your colon directly (sigmoidoscopy or colonoscopy). This is done to check for the earliest forms of colorectal cancer.  Routine  screening usually begins at age 41.  Direct examination of the colon should be repeated every 5-10 years through 66 years of age. However, you may need to be screened more often if early forms of precancerous polyps or small growths are found. Skin Cancer  Check your skin from head to toe regularly.  Tell your health care provider about any new moles or changes in moles, especially if there is a change in a mole's shape or color.  Also tell your health care provider if you have a mole that is larger than the size of a pencil eraser.  Always use sunscreen. Apply sunscreen liberally and repeatedly throughout the day.  Protect yourself by wearing long sleeves, pants, a wide-brimmed hat, and sunglasses whenever you are outside. Heart disease, diabetes, and high blood pressure  High blood pressure causes heart disease and increases the risk of stroke. High blood pressure is more likely to develop in:  People who have blood pressure in the high end of the normal range (130-139/85-89 mm Hg).  People who are overweight or obese.  People who are African American.  If you are 59-24 years of age, have your blood pressure checked every 3-5 years. If you are 34 years of age or older, have your blood pressure checked every year. You should have your blood pressure measured twice-once when you are at a hospital or clinic, and once when you are not at a hospital or clinic. Record the average of the two measurements. To check your blood pressure when you are not at a hospital or clinic, you can use:  An automated blood pressure machine at a pharmacy.  A home blood pressure monitor.  If you are between 29 years and 60 years old, ask your health care provider if you should take aspirin to prevent strokes.  Have regular diabetes screenings. This involves taking a blood sample to check your fasting blood sugar level.  If you are at a normal weight and have a low risk for diabetes, have this test once  every three years after 66 years of age.  If you are overweight and have a high risk for diabetes, consider being tested at a younger age or more often. Preventing infection Hepatitis B  If you have a higher risk for hepatitis B, you should be screened for this virus. You are considered at high risk for hepatitis B if:  You were born in a country where hepatitis B is common. Ask your health care provider which countries are considered high risk.  Your parents were born in a high-risk country, and you have not been immunized against hepatitis B (hepatitis B vaccine).  You have HIV or AIDS.  You use needles to inject street drugs.  You live with someone who has hepatitis B.  You have had sex with someone who has hepatitis B.  You get hemodialysis treatment.  You take certain medicines for conditions, including cancer, organ transplantation, and autoimmune conditions. Hepatitis C  Blood testing is recommended for:  Everyone born from [birth year] through 1965.  Anyone with known risk factors for hepatitis C. Sexually transmitted infections (STIs)  You should be screened for sexually transmitted infections (STIs) including gonorrhea and chlamydia if:  You are sexually active and are younger than 66 years of age.  You are older than 66 years of age and your health care provider tells you that you are at risk for this type of infection.  Your sexual activity has changed since you were last screened and you are at an increased risk for chlamydia or gonorrhea. Ask your health care provider if you are at risk.  If you do not have HIV, but are at risk, it may be recommended that you take a prescription medicine daily to prevent HIV infection. This is called pre-exposure prophylaxis (PrEP). You are considered at risk if:  You are sexually active and do not regularly use condoms or know the HIV status of your partner(s).  You take drugs by injection.  You are sexually active with a partner  who has HIV. Talk with your health care provider about whether you are at high risk of being infected with HIV. If you choose to begin PrEP, you should first be tested for HIV. You should then be tested every 3 months for as long as you are taking PrEP. Pregnancy  If you are premenopausal and you may become pregnant, ask your health care provider about preconception counseling.  If you may become pregnant, take 400 to 800 micrograms (mcg) of folic acid  every day.  If you want to prevent pregnancy, talk to your health care provider about birth control (contraception). Osteoporosis and menopause  Osteoporosis is a disease in which the bones lose minerals and strength with aging. This can result in serious bone fractures. Your risk for osteoporosis can be identified using a bone density scan.  If you are 4 years of age or older, or if you are at risk for osteoporosis and fractures, ask your health care provider if you should be screened.  Ask your health care provider whether you should take a calcium or vitamin D supplement to lower your risk for osteoporosis.  Menopause may have certain physical symptoms and risks.  Hormone replacement therapy may reduce some of these symptoms and risks. Talk to your health care provider about whether hormone replacement therapy is right for you. Follow these instructions at home:  Schedule regular health, dental, and eye exams.  Stay current with your immunizations.  Do not use any tobacco products including cigarettes, chewing tobacco, or electronic cigarettes.  If you are pregnant, do not drink alcohol.  If you are breastfeeding, limit how much and how often you drink alcohol.  Limit alcohol intake to no more than 1 drink per day for nonpregnant women. One drink equals 12 ounces of beer, 5 ounces of wine, or 1 ounces of hard liquor.  Do not use street drugs.  Do not share needles.  Ask your health care provider for help if you need support  or information about quitting drugs.  Tell your health care provider if you often feel depressed.  Tell your health care provider if you have ever been abused or do not feel safe at home. This information is not intended to replace advice given to you by your health care provider. Make sure you discuss any questions you have with your health care provider. Document Released: 05/03/2011 Document Revised: 03/25/2016 Document Reviewed: 07/22/2015 Elsevier Interactive Patient Education  2017 [employer].

## 2017-01-24 NOTE — Telephone Encounter (Signed)
Medication filled to pharmacy as requested. Also left message for pt notifying her she needs to schedule follow-up.

## 2017-01-25 ENCOUNTER — Other Ambulatory Visit: Payer: Self-pay | Admitting: Internal Medicine

## 2017-01-25 DIAGNOSIS — E1165 Type 2 diabetes mellitus with hyperglycemia: Principal | ICD-10-CM

## 2017-01-25 DIAGNOSIS — E1151 Type 2 diabetes mellitus with diabetic peripheral angiopathy without gangrene: Secondary | ICD-10-CM

## 2017-01-25 DIAGNOSIS — IMO0002 Reserved for concepts with insufficient information to code with codable children: Secondary | ICD-10-CM

## 2017-02-04 ENCOUNTER — Other Ambulatory Visit: Payer: Self-pay | Admitting: Family Medicine

## 2017-02-07 ENCOUNTER — Encounter: Payer: Self-pay | Admitting: Family Medicine

## 2017-02-08 ENCOUNTER — Telehealth: Payer: Self-pay | Admitting: *Deleted

## 2017-02-08 NOTE — Telephone Encounter (Signed)
noted 

## 2017-02-08 NOTE — Telephone Encounter (Signed)
Pt had sent mychart message to [facility] inquiring about urine analysis result from OV on 01/24/17. It appears the urine was never processed by the lab. Pt notified. Pt states she will come by at some point this week to give another urine sample due to still having mild burning with urination.

## 2017-02-16 ENCOUNTER — Other Ambulatory Visit: Payer: Self-pay | Admitting: *Deleted

## 2017-02-16 ENCOUNTER — Other Ambulatory Visit: Payer: Medicare Other

## 2017-02-16 DIAGNOSIS — R300 Dysuria: Secondary | ICD-10-CM

## 2017-02-16 LAB — POC URINALSYSI DIPSTICK (AUTOMATED)
Bilirubin, UA: NEGATIVE
Blood, UA: NEGATIVE
Ketones, UA: NEGATIVE
Leukocytes, UA: NEGATIVE
Nitrite, UA: NEGATIVE
Protein, UA: NEGATIVE
Spec Grav, UA: 1.02 (ref 1.010–1.025)
Urobilinogen, UA: 0.2 E.U./dL
pH, UA: 6 (ref 5.0–8.0)

## 2017-02-18 LAB — URINE CULTURE

## 2017-03-09 IMAGING — US US EXTREM LOW VENOUS*R*
1 series · 13 of 24 positions shown · non-contrast
Comparison: None.

CLINICAL DATA: Right lower extremity pain for 1 month



[Series 1: us extrem low venous*right* · 0.10mm/px · 13 of 27 slices shown]
[im 1/27]
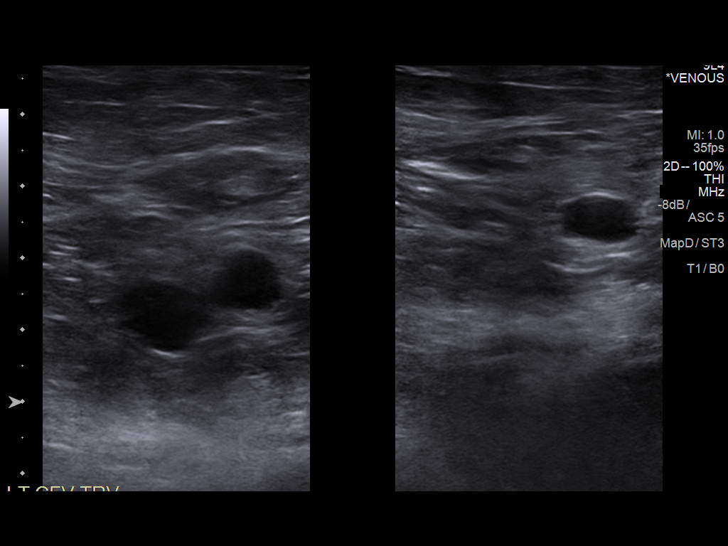
[im 3/27]
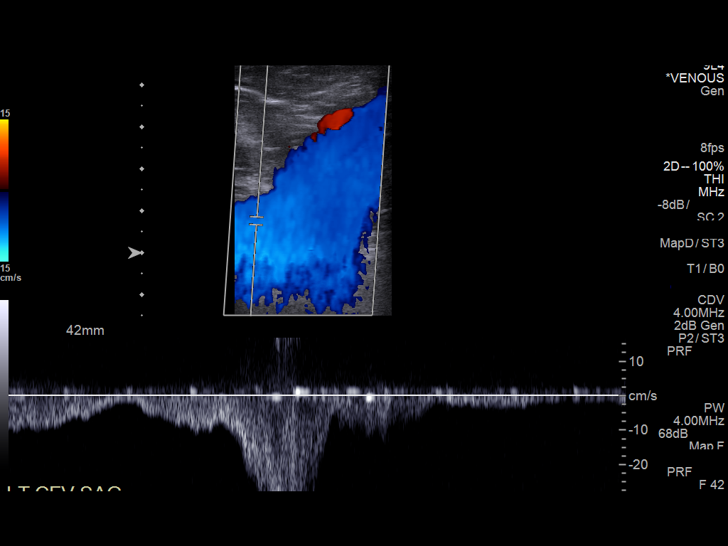
[im 5/27]
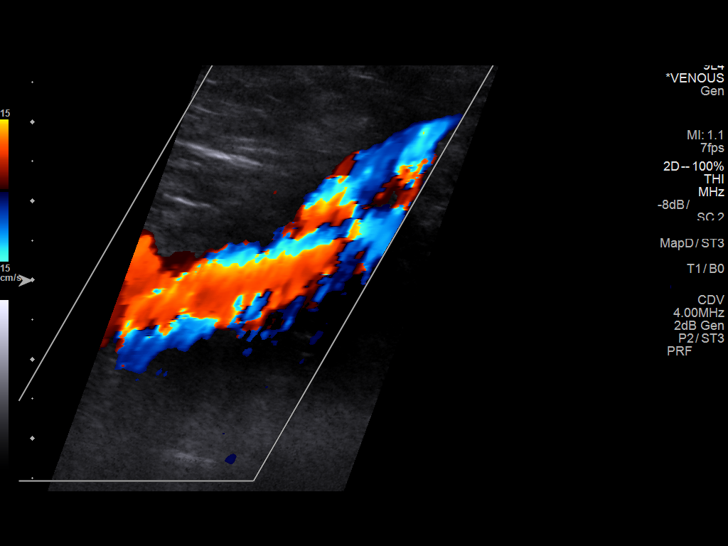
[im 7/27]
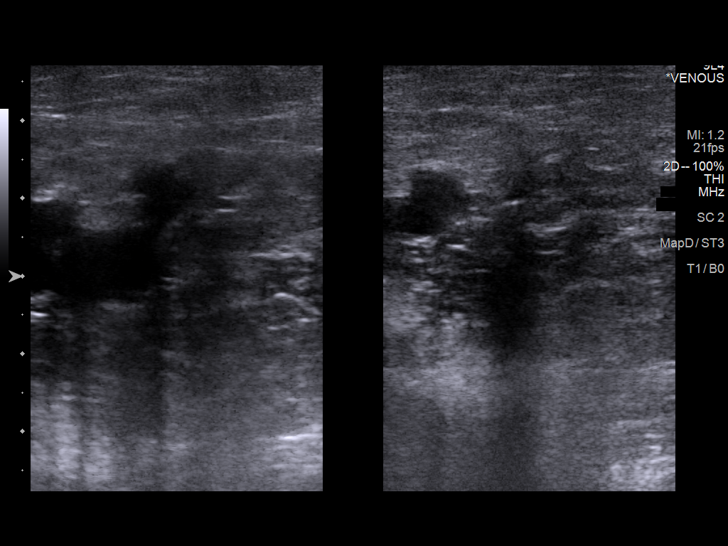
[im 10/27]
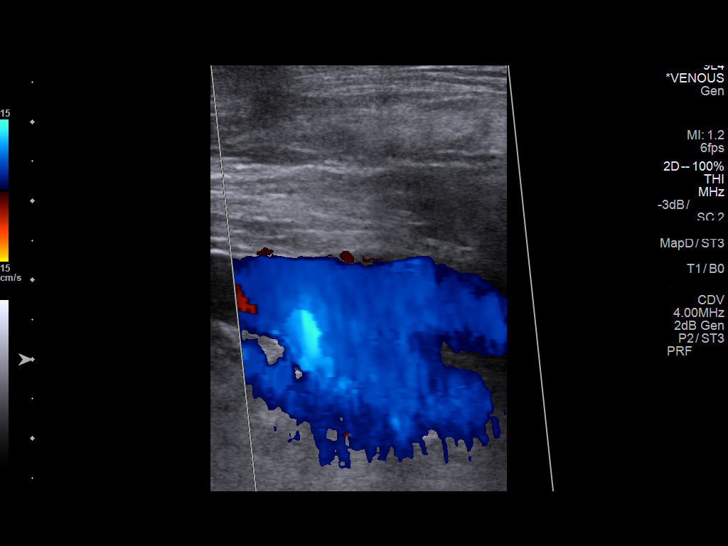
[im 12/27]
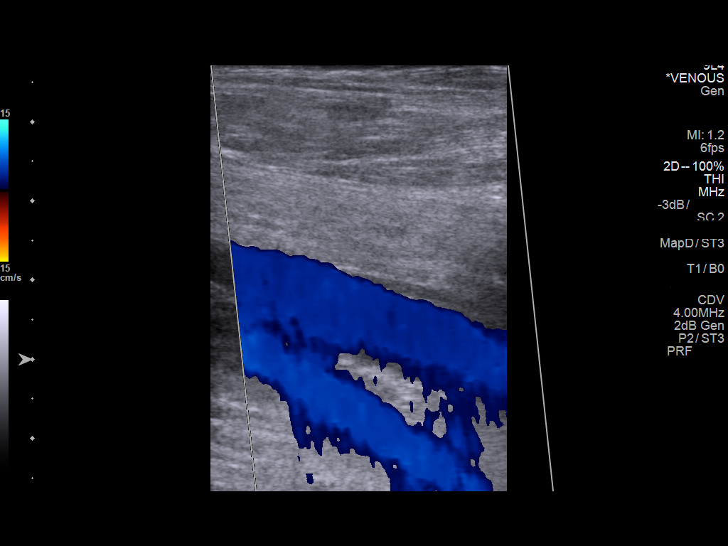
[im 14/27]
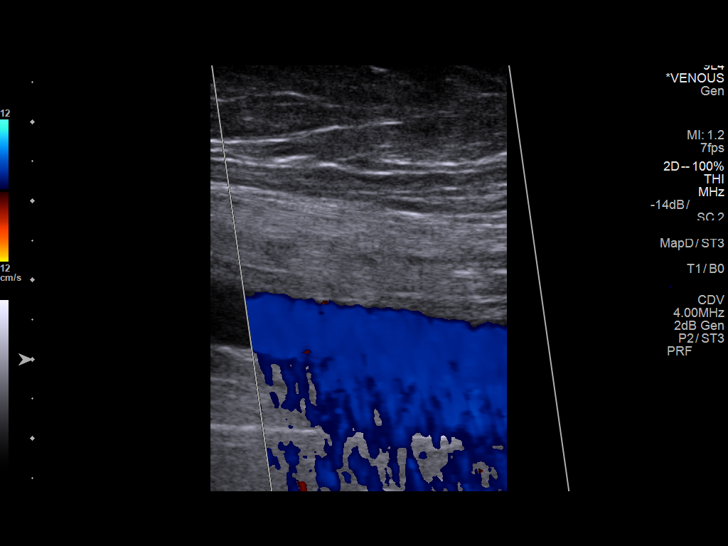
[im 15/27]
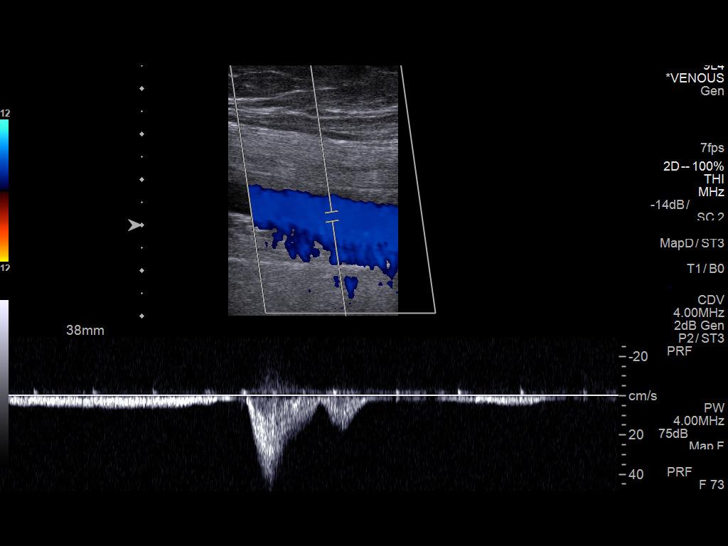
[im 17/27]
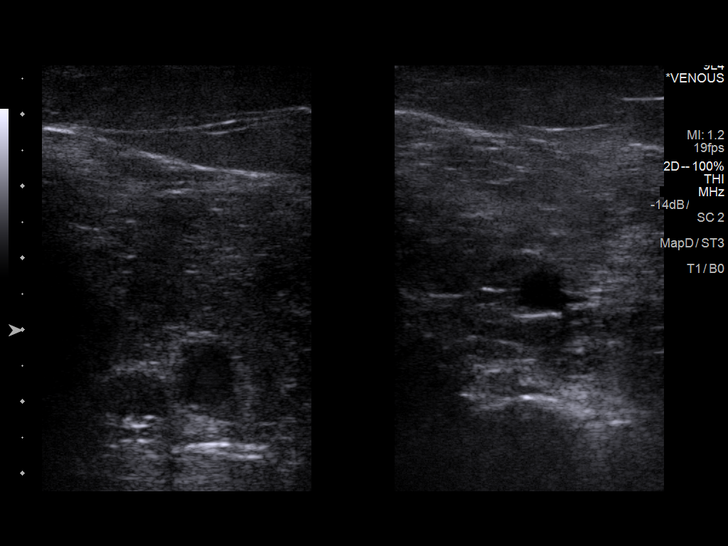
[im 20/27]
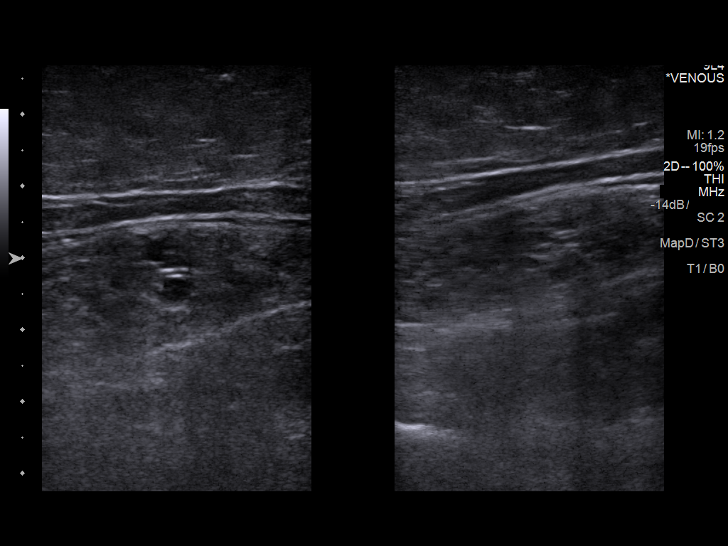
[im 22/27]
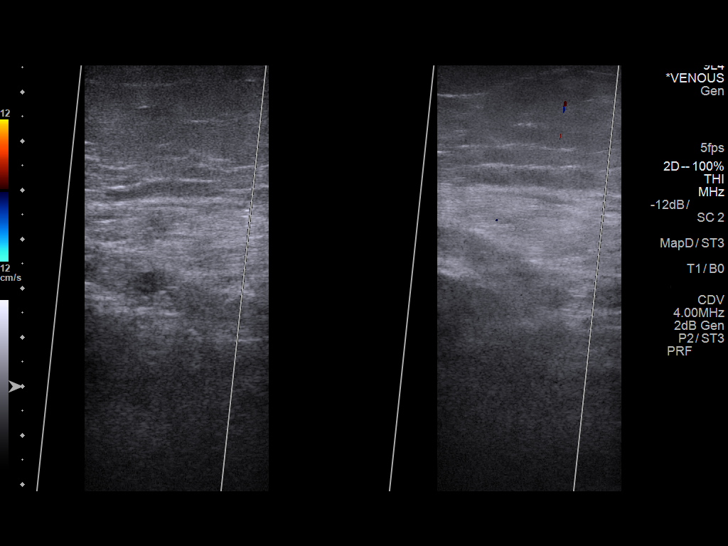
[im 24/27]
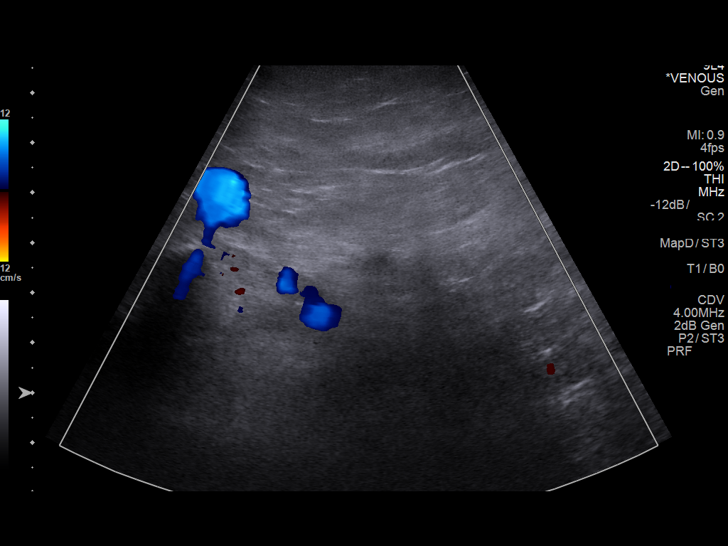
[im 27/27]
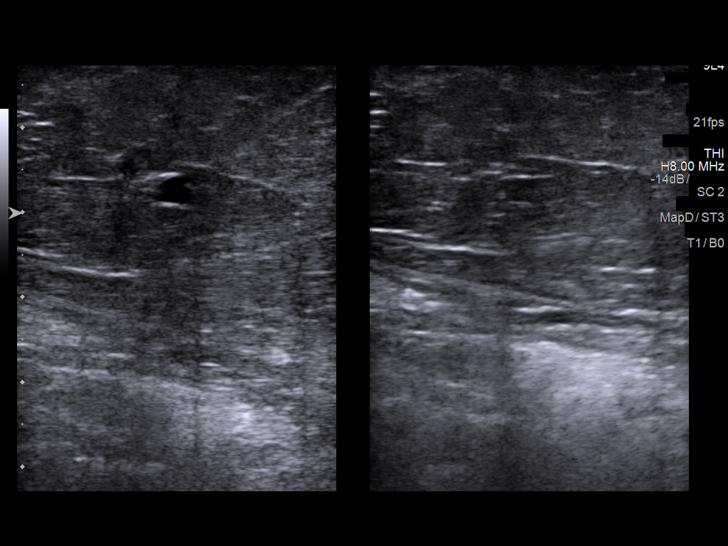

[13 of 24 positions shown; findings below may reference images not displayed]

FINDINGS: Contralateral Common Femoral Vein: Respiratory phasicity is normal
and symmetric with the symptomatic side. No evidence of thrombus.
Normal compressibility.

Common Femoral Vein: No evidence of thrombus. Normal
compressibility, respiratory phasicity and response to augmentation.

Saphenofemoral Junction: No evidence of thrombus. Normal
compressibility and flow on color Doppler imaging.

Profunda Femoral Vein: No evidence of thrombus. Normal
compressibility and flow on color Doppler imaging.

Femoral Vein: No evidence of thrombus. Normal compressibility,
respiratory phasicity and response to augmentation.

Popliteal Vein: No evidence of thrombus. Normal compressibility,
respiratory phasicity and response to augmentation.

Calf Veins: Limited assessment of the calf veins. Posterior tibial
vein appears patent. Peroneal vein not visualized.

Superficial Great Saphenous Vein: No evidence of thrombus. Normal
compressibility and flow on color Doppler imaging.

Venous Reflux:  None.

Other Findings:  None.
IMPRESSION: No significant right lower extremity DVT. Limited assessment of the
calf veins.

## 2017-03-10 ENCOUNTER — Ambulatory Visit (INDEPENDENT_AMBULATORY_CARE_PROVIDER_SITE_OTHER): Payer: Medicare Other

## 2017-03-10 ENCOUNTER — Ambulatory Visit (INDEPENDENT_AMBULATORY_CARE_PROVIDER_SITE_OTHER): Payer: Medicare Other | Admitting: Podiatry

## 2017-03-10 DIAGNOSIS — R52 Pain, unspecified: Secondary | ICD-10-CM

## 2017-03-10 DIAGNOSIS — M79673 Pain in unspecified foot: Secondary | ICD-10-CM | POA: Diagnosis not present

## 2017-03-10 DIAGNOSIS — S99921A Unspecified injury of right foot, initial encounter: Secondary | ICD-10-CM

## 2017-03-10 NOTE — Progress Notes (Signed)
This patient presents the office with chief complaint of a painful fifth toe of the right foot. She says that she injured her toe as she tripped into the couch at her house. She says that when she puts on her shoes the fifth toe throbs and aches.  She is diabetic and is concerned that the toe may be broken. She presents the office for an evaluation and treatment of this fifth toe right foot  GENERAL APPEARANCE: Alert, conversant. Appropriately groomed. No acute distress.  VASCULAR: Pedal pulses are not   palpable at  [facility] bilateral due to swelling.  Capillary refill time is immediate to all digits,  Normal temperature gradient.   NEUROLOGIC: sensation is diminished  to 5.07 monofilament at 5/5 sites bilateral.  Light touch is intact bilateral, Muscle strength normal.  MUSCULOSKELETAL: acceptable muscle strength, tone and stability bilateral.  Intrinsic muscluature intact bilateral.  Rectus appearance of foot and digits noted bilateral. Palpable pain noted fifth toe right foot.  Fifth toe is also swollen.  Increased temperature noted lateral aspect of right foot.  DERMATOLOGIC: skin color, texture, and turgor are within normal limits.  No preulcerative lesions or ulcers  are seen, no interdigital maceration noted.  No open lesions present.  Digital nails are asymptomatic. No drainage noted. Pinch callus  B/L  Diagnosis  Toe sprain right foot.   ROV  x-ray reveals no evidence of any bony pathology and the fifth toe right foot. Discussed condition with patient and talked patient to buddy tape her fifth toe to her fourth toe, right foot. Told the patient she would do better wearing open toed shoes and not too tight shoes that she presented to the office with today.  RTC prn.     Helane GuntherGregory Deeanne Deininger DPM

## 2017-03-20 ENCOUNTER — Other Ambulatory Visit: Payer: Self-pay | Admitting: Family Medicine

## 2017-03-20 DIAGNOSIS — E1165 Type 2 diabetes mellitus with hyperglycemia: Principal | ICD-10-CM

## 2017-03-20 DIAGNOSIS — IMO0002 Reserved for concepts with insufficient information to code with codable children: Secondary | ICD-10-CM

## 2017-03-20 DIAGNOSIS — E1151 Type 2 diabetes mellitus with diabetic peripheral angiopathy without gangrene: Secondary | ICD-10-CM

## 2017-03-31 ENCOUNTER — Encounter: Payer: Self-pay | Admitting: Family Medicine

## 2017-03-31 DIAGNOSIS — E1165 Type 2 diabetes mellitus with hyperglycemia: Principal | ICD-10-CM

## 2017-03-31 DIAGNOSIS — IMO0002 Reserved for concepts with insufficient information to code with codable children: Secondary | ICD-10-CM

## 2017-03-31 DIAGNOSIS — E1151 Type 2 diabetes mellitus with diabetic peripheral angiopathy without gangrene: Secondary | ICD-10-CM

## 2017-03-31 MED ORDER — METFORMIN HCL ER 750 MG PO TB24: 750.0000 mg | ORAL_TABLET | Freq: Three times a day (TID) | ORAL | 1 refills | Status: DC

## 2017-04-11 ENCOUNTER — Other Ambulatory Visit: Payer: Self-pay | Admitting: Family Medicine

## 2017-04-21 ENCOUNTER — Ambulatory Visit (HOSPITAL_BASED_OUTPATIENT_CLINIC_OR_DEPARTMENT_OTHER)
Admission: RE | Admit: 2017-04-21 | Discharge: 2017-04-21 | Disposition: A | Discharge status: Home / Self Care | Payer: Medicare Other | Source: Ambulatory Visit | Attending: Family Medicine | Admitting: Family Medicine

## 2017-04-21 ENCOUNTER — Ambulatory Visit (INDEPENDENT_AMBULATORY_CARE_PROVIDER_SITE_OTHER): Payer: Medicare Other | Admitting: Family Medicine

## 2017-04-21 ENCOUNTER — Encounter: Payer: Self-pay | Admitting: Family Medicine

## 2017-04-21 VITALS — BP 130/58 | HR 101 | Temp 99.3°F | Resp 16 | Ht 63.0 in | Wt 263.8 lb

## 2017-04-21 DIAGNOSIS — E1165 Type 2 diabetes mellitus with hyperglycemia: Secondary | ICD-10-CM

## 2017-04-21 DIAGNOSIS — J0140 Acute pansinusitis, unspecified: Secondary | ICD-10-CM | POA: Diagnosis not present

## 2017-04-21 DIAGNOSIS — J209 Acute bronchitis, unspecified: Secondary | ICD-10-CM | POA: Diagnosis not present

## 2017-04-21 DIAGNOSIS — E1151 Type 2 diabetes mellitus with diabetic peripheral angiopathy without gangrene: Secondary | ICD-10-CM

## 2017-04-21 DIAGNOSIS — R938 Abnormal findings on diagnostic imaging of other specified body structures: Secondary | ICD-10-CM | POA: Insufficient documentation

## 2017-04-21 DIAGNOSIS — IMO0002 Reserved for concepts with insufficient information to code with codable children: Secondary | ICD-10-CM

## 2017-04-21 IMAGING — DX DG CHEST 2V
2 series · 2 of 2 positions shown · non-contrast
Comparison: [DATE]

CLINICAL DATA: Congestion and nonproductive cough for few days,
history hypertension, diabetes mellitus, former smoker

EXAM:
CHEST  2 VIEW

[chest pa]
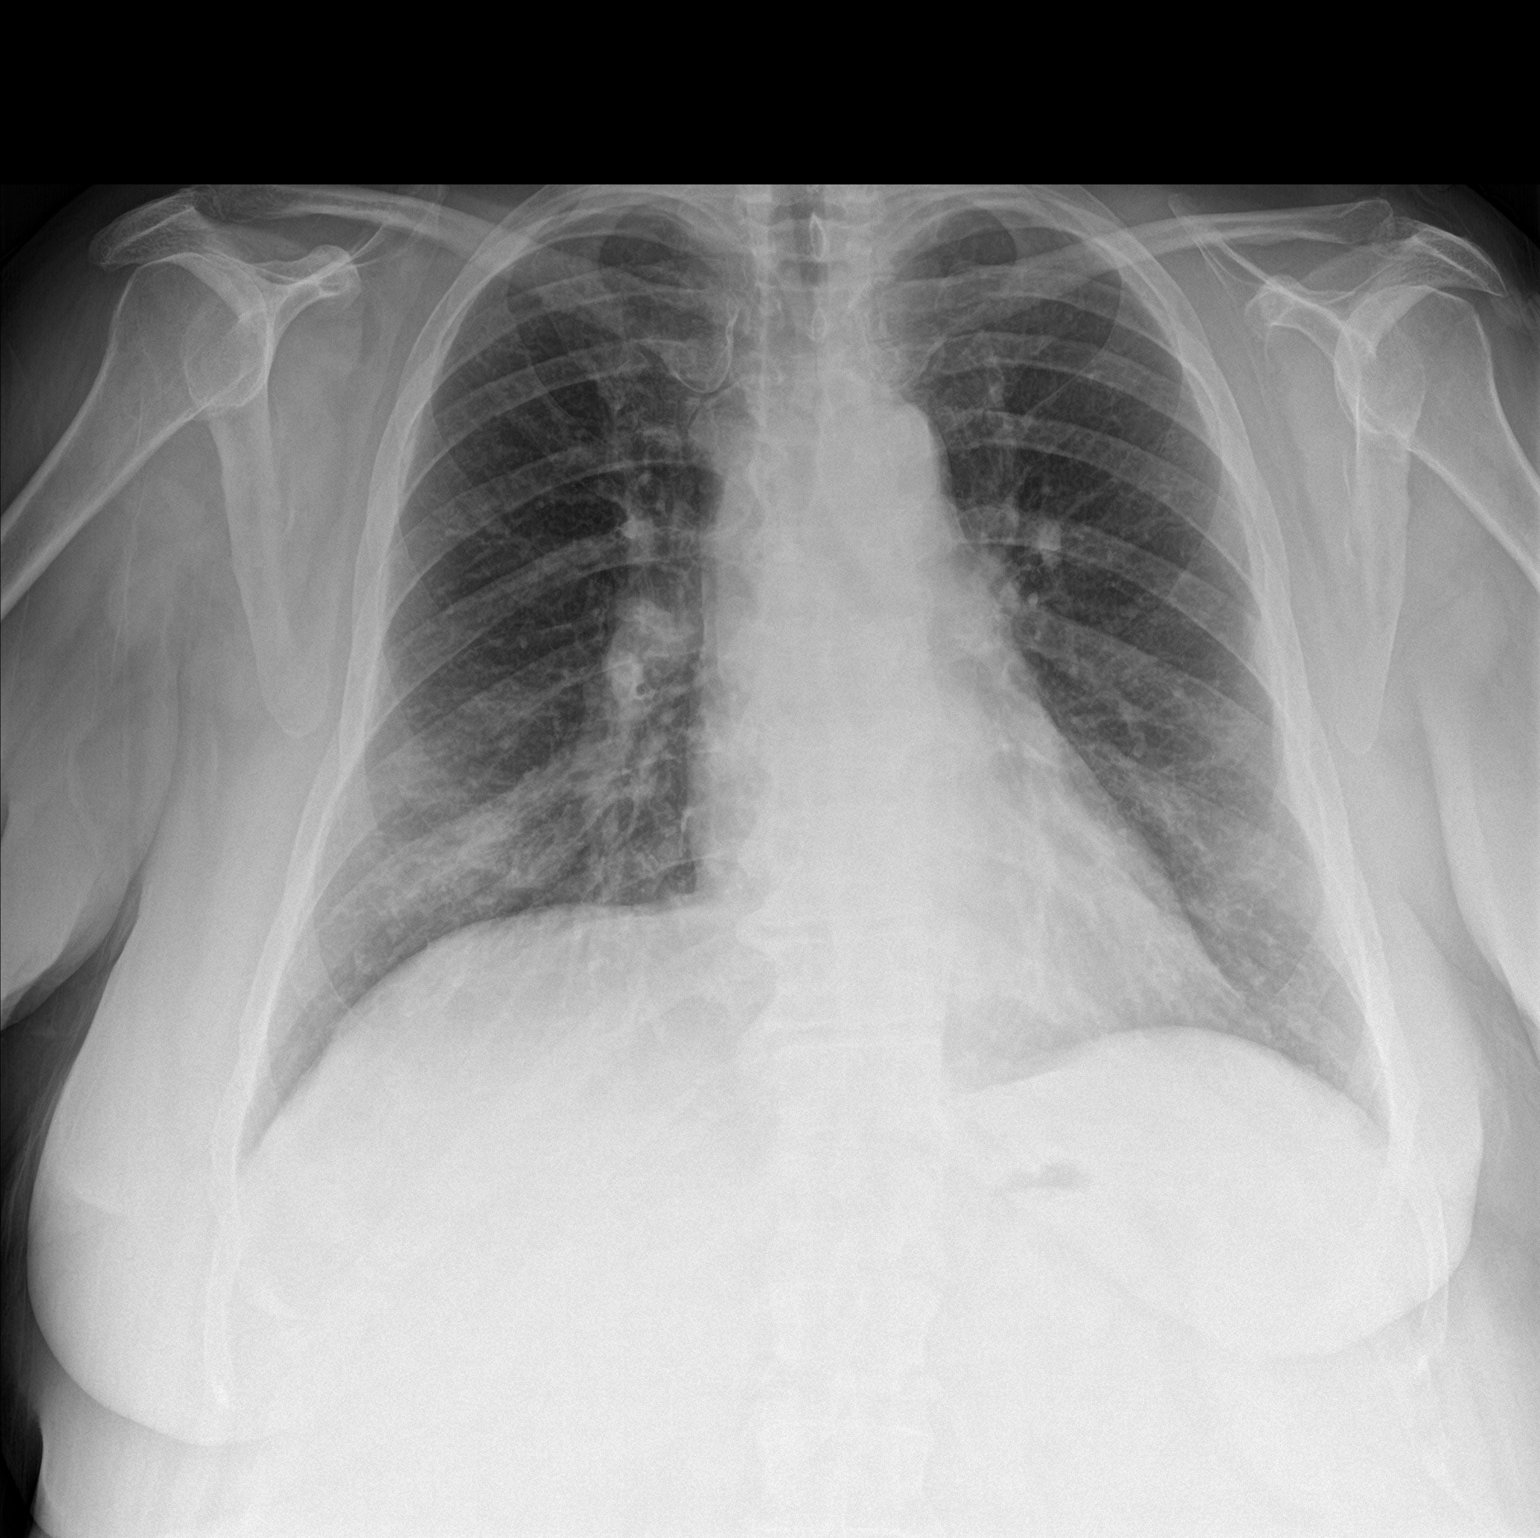

[chest lat]
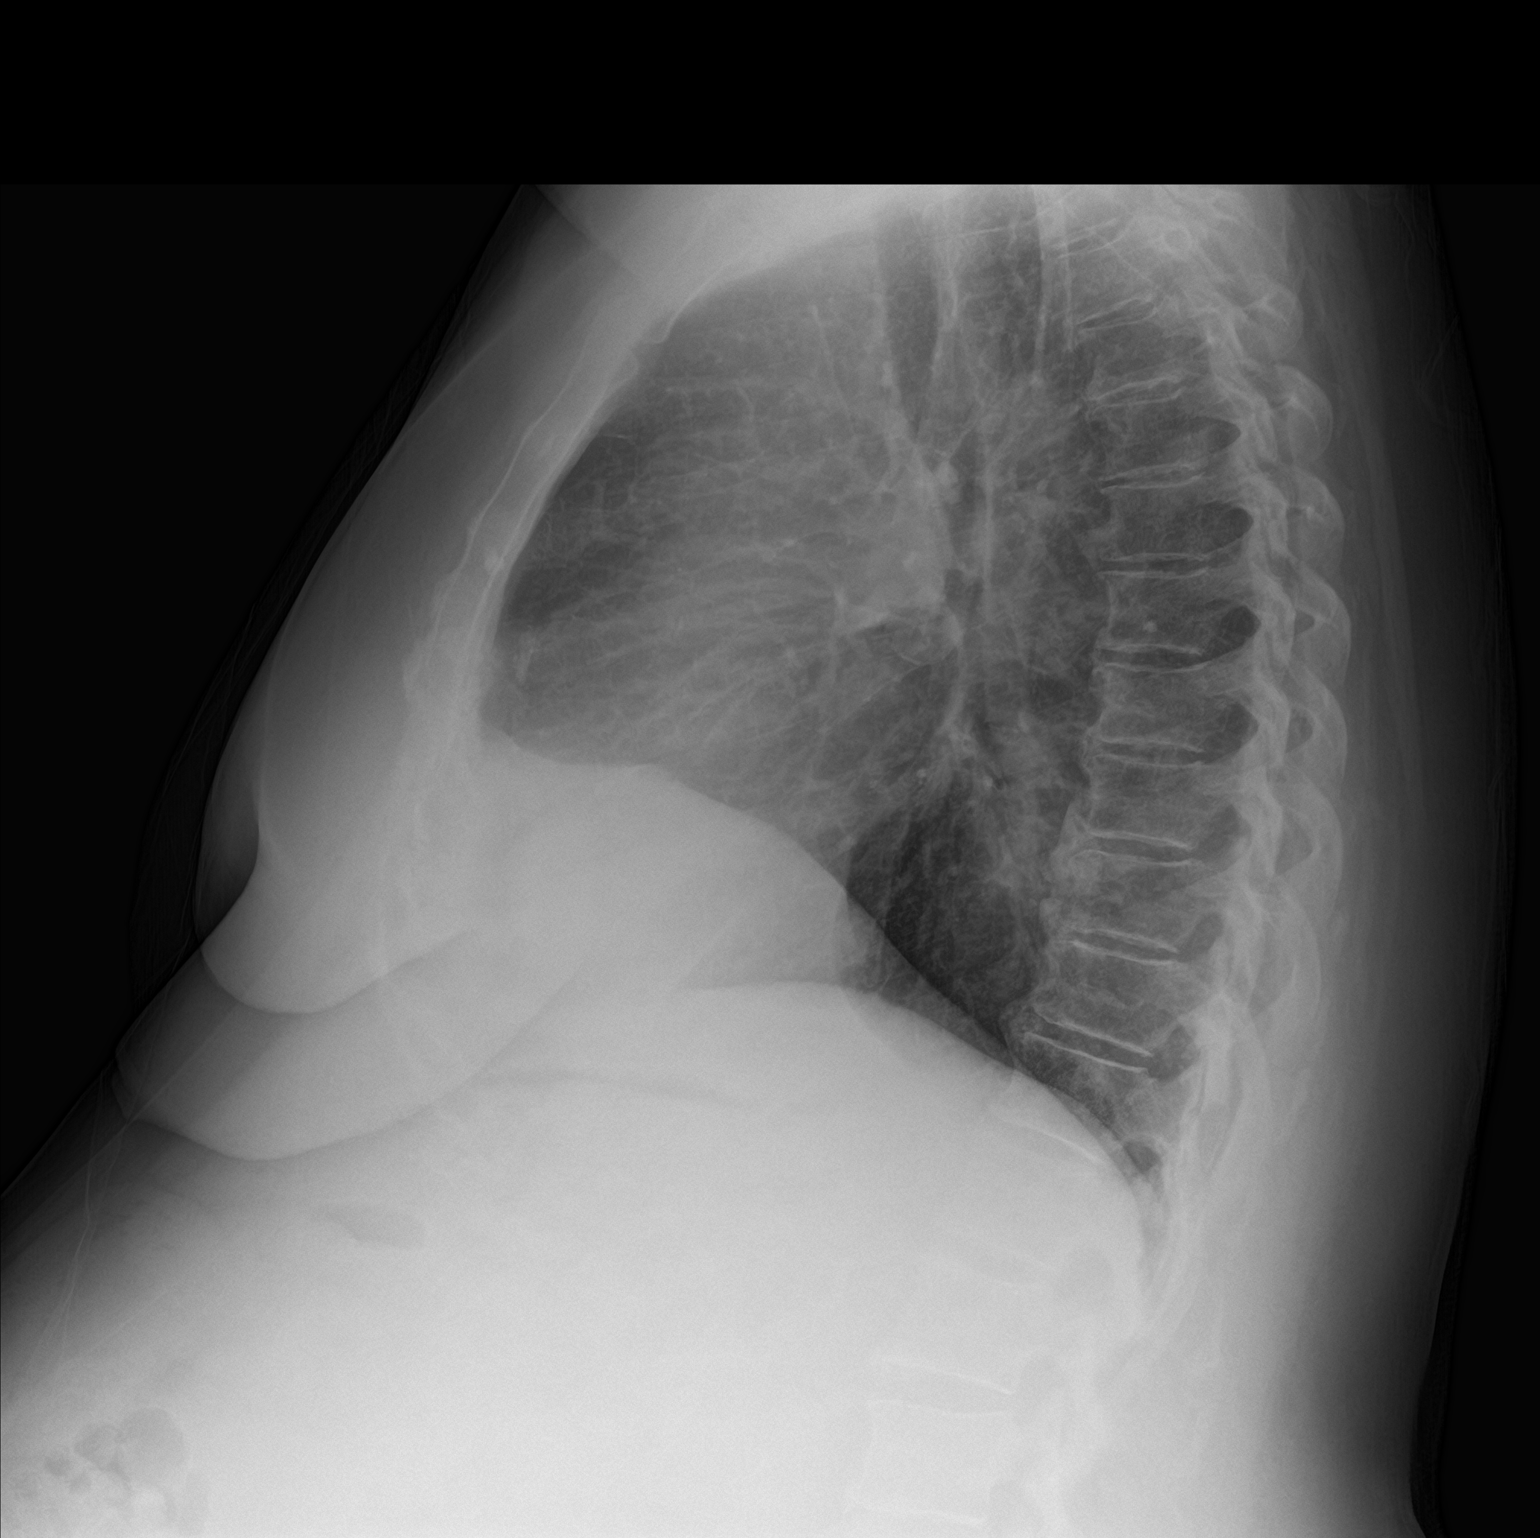

[2 of 2 positions shown; findings below may reference images not displayed]

FINDINGS: Normal heart size, mediastinal contours, and pulmonary vascularity.

Bronchitic changes with mild RIGHT basilar opacity question
pneumonia.

Remaining lungs clear.

No pleural effusion or pneumothorax.

Bones demineralized.
IMPRESSION: Bronchitic changes with questionable RIGHT basilar infiltrate
suspicious for pneumonia.

## 2017-04-21 MED ORDER — AZITHROMYCIN 250 MG PO TABS
ORAL_TABLET | ORAL | 0 refills | Status: DC
Start: 2017-04-21 — End: 2017-04-22

## 2017-04-21 MED ORDER — PROMETHAZINE-DM 6.25-15 MG/5ML PO SYRP: 5.0000 mL | ORAL_SOLUTION | Freq: Four times a day (QID) | ORAL | 0 refills | Status: DC | PRN

## 2017-04-21 NOTE — Progress Notes (Signed)
Patient ID: Andrea Marks, female   DOB: [birth year], 66 y.o.   MRN: [id number]     Subjective:  I acted as a Education administrator for Dr. Carollee Herter.  Guerry Bruin, [place]   Patient ID: Andrea Marks, female    DOB: 06-16-1951, 66 y.o.   MRN: [id number]  Chief Complaint  Patient presents with  . Sore Throat    Sore Throat   This is a new problem. Episode onset: sunday. Sore throat worse side: both sides. Associated symptoms include congestion and coughing. Pertinent negatives include no abdominal pain, diarrhea, headaches, shortness of breath or vomiting. Treatments tried: diabetic tussin alkaseltzer cold and flu.    Patient is in today for sore throat.  Patient Care Team: Carollee Herter, Alferd Apa, DO as PCP - General Gardiner Barefoot, DPM as Consulting Physician (Podiatry)   Past Medical History:  Diagnosis Date  . Depression   . Diabetes mellitus   . GERD (gastroesophageal reflux disease)   . Hyperlipidemia   . Hypertension   . Obesity     Past Surgical History:  Procedure Laterality Date  . ABDOMINAL HYSTERECTOMY  1991   BSO    Family History  Problem Relation Age of Onset  . Macular degeneration Mother   . Heart disease Mother        syncope  . Lung disease Father 64       mesothelioma  . Cancer Maternal Aunt        stomach  . Heart disease Paternal Uncle        cabg  . Diabetes Paternal Uncle   . Heart disease Paternal Uncle   . Diabetes Paternal Uncle   . Diabetes Paternal Uncle   . Heart disease Paternal Uncle   . Heart disease Paternal Uncle   . Heart disease Paternal Uncle   . Lung cancer Unknown        asbestos  . Diabetes Paternal Grandmother   . Cancer Other        lung    Social History   Social History  . Marital status: Married    Spouse name: N/A  . Number of children: 0  . Years of education: 57   Occupational History  . retired from [employer]ired   Social History Main Topics  . Smoking status: Former Research scientist (life sciences)  . Smokeless tobacco: Never  Used  . Alcohol use No  . Drug use: No  . Sexual activity: Yes    Partners: Male   Other Topics Concern  . Not on file   Social History Narrative   No reg exercise    Outpatient Medications Prior to Visit  Medication Sig Dispense Refill  . acetaminophen (TYLENOL ARTHRITIS PAIN) 650 MG CR tablet Take 650 mg by mouth 2 (two) times daily as needed. For pain    . aspirin 81 MG tablet Take 81 mg by mouth daily.      . benazepril (LOTENSIN) 20 MG tablet TAKE 1 TABLET DAILY 90 tablet 1  . Bioflavonoid Products (ESTER C PO) Take 500 mg by mouth daily.    Marland Kitchen ezetimibe-simvastatin (VYTORIN) 10-40 MG tablet TAKE 1 TABLET AT BEDTIME (REPEAT LABS ARE DUE NOW) 90 tablet 0  . fenofibrate 160 MG tablet TAKE 1 TABLET DAILY 90 tablet 0  . FLUoxetine (PROZAC) 20 MG capsule TAKE 3 CAPSULES DAILY 270 capsule 0  . glimepiride (AMARYL) 4 MG tablet TAKE 1 TABLET TWICE A DAY (NEED APPOINTMENT FOR FURTHER REFILLS) 180 tablet 0  . Insulin Glargine (LANTUS SOLOSTAR)  100 UNIT/ML Solostar Pen 45 units subcue daily at Bedtime 90 mL 1  . meloxicam (MOBIC) 15 MG tablet Take 1 tablet by mouth daily as needed.  0  . MENEST 1.25 MG TABS TAKE 2 TABLETS EVERY OTHER DAY 90 tablet 1  . metFORMIN (GLUCOPHAGE-XR) 750 MG 24 hr tablet Take 1 tablet (750 mg total) by mouth 3 (three) times daily. 270 tablet 1  . Misc Natural Products (LUTEIN VISION BLEND) CAPS Take 1 capsule by mouth daily.    Marland Kitchen NOVOFINE 32G X 6 MM MISC USE 1 NEEDLE UNDER THE SKIN DAILY 100 each 1  . ONE TOUCH ULTRA TEST test strip USE TO TEST BLOOD SUGAR 2 TIMES DAILY AS INSTRUCTED. DX: E11.65 50 each 3  . ranitidine (ZANTAC) 300 MG capsule TAKE 1 CAPSULE DAILY 90 capsule 3  . Blood Glucose Monitoring Suppl (ONE TOUCH ULTRA SYSTEM KIT) W/DEVICE KIT 1 kit by Does not apply route once. (Patient not taking: Reported on 04/21/2017) 1 each 0  . ONE TOUCH ULTRA TEST test strip USE TO TEST BLOOD SUGAR TWO TIMES DAILY AS INSTRUCTED (Patient not taking: Reported on  04/21/2017) 200 each 0  . ONETOUCH DELICA LANCETS FINE MISC USE AS INSTRUCTED (Patient not taking: Reported on 04/21/2017) 1 each 9  . Beclomethasone Diprop HFA (QVAR REDIHALER) 40 MCG/ACT AERB Inhale 2 puffs into the lungs 2 (two) times daily. (Patient not taking: Reported on 01/24/2017) 1 Inhaler 6  . Dulaglutide (TRULICITY) 1.5 YC/1.4GY SOPN INJECT 1.5 MG INTO THE SKIN ONCE A WEEK 12 pen 1  . HUMALOG KWIKPEN 100 UNIT/ML KiwkPen INJECT 10 UNITS BEFORE BREAKFAST, 10 UNITS BEFORE LUNCH AND 18 UNITS BEFORE SUPPER DAILY 15 pen 0  . insulin lispro (HUMALOG KWIKPEN) 100 UNIT/ML KiwkPen Inject 10 units before breakfast, 10 units before lunch and 18 units before supper daily. 45 mL 1   Facility-Administered Medications Prior to Visit  Medication Dose Route Frequency Provider Last Rate Last Dose  . ipratropium-albuterol (DUONEB) 0.5-2.5 (3) MG/3ML nebulizer solution 3 mL  3 mL Nebulization Q6H Copland, Jessica C, MD   3 mL at 11/15/16 1330    Allergies  Allergen Reactions  . Hydrocodone-Acetaminophen Other (See Comments)    unknown  . Oxycodone Hcl Other (See Comments)    unknown  . Penicillins Other (See Comments)    unknown  . Prednisone     Review of Systems  Constitutional: Negative for chills, fever and malaise/fatigue.  HENT: Positive for congestion, sinus pain and sore throat. Negative for hearing loss.   Eyes: Negative for discharge.  Respiratory: Positive for cough. Negative for sputum production and shortness of breath.   Cardiovascular: Negative for chest pain, palpitations and leg swelling.  Gastrointestinal: Negative for abdominal pain, blood in stool, constipation, diarrhea, heartburn, nausea and vomiting.  Genitourinary: Negative for dysuria, frequency, hematuria and urgency.  Musculoskeletal: Negative for back pain, falls and myalgias.  Skin: Negative for rash.  Neurological: Negative for dizziness, sensory change, loss of consciousness, weakness and headaches.    Endo/Heme/Allergies: Negative for environmental allergies. Does not bruise/bleed easily.  Psychiatric/Behavioral: Negative for depression and suicidal ideas. The patient is not nervous/anxious and does not have insomnia.        Objective:    Physical Exam  Constitutional: She is oriented to person, place, and time. She appears well-developed and well-nourished.  HENT:  Right Ear: External ear normal.  Left Ear: External ear normal.  + PND + errythema  Eyes: Conjunctivae are normal. Right eye exhibits no discharge.  Left eye exhibits no discharge.  Cardiovascular: Normal rate, regular rhythm and normal heart sounds.   No murmur heard. Pulmonary/Chest: Effort normal. No respiratory distress. She has wheezes. She has rales. She exhibits no tenderness.  Musculoskeletal: She exhibits no edema.  Lymphadenopathy:    She has cervical adenopathy.  Neurological: She is alert and oriented to person, place, and time.  Psychiatric: She has a normal mood and affect. Her behavior is normal. Judgment and thought content normal.  Nursing note and vitals reviewed.   BP (!) 130/58 (BP Location: Left Arm, Cuff Size: Large)   Pulse (!) 101   Temp 99.3 F (37.4 C) (Oral)   Resp 16   Ht _0  (1.6 m)   Wt 263 lb 12.8 oz (119.7 kg)   SpO2 94%   BMI 46.73 kg/m  Wt Readings from Last 3 Encounters:  04/21/17 263 lb 12.8 oz (119.7 kg)  01/24/17 274 lb (124.3 kg)  12/16/16 277 lb (125.6 kg)   BP Readings from Last 3 Encounters:  04/21/17 (!) 130/58  01/24/17 116/66  12/07/16 (!) 162/84     Immunization History  Administered Date(s) Administered  . Pneumococcal Conjugate-13 08/27/2016  . Pneumococcal Polysaccharide-23 05/08/2003, 11/10/2009  . Td 05/08/2003  . Tdap 06/08/2011  . Zoster 02/10/2012    Health Maintenance  Topic Date Due  . MAMMOGRAM  02/23/2013  . DEXA SCAN  02/19/2016  . FOOT EXAM  10/06/2016  . Hepatitis C Screening  06/11/2017 (Originally Nov 01, 1951)  . INFLUENZA  VACCINE  08/27/2017 (Originally 06/01/2017)  . PNA vac Low Risk Adult (2 of 2 - PPSV23) 08/27/2017  . HEMOGLOBIN A1C  10/21/2017  . OPHTHALMOLOGY EXAM  12/28/2017  . COLONOSCOPY  02/16/2021  . TETANUS/TDAP  06/07/2021    Lab Results  Component Value Date   WBC 8.1 04/21/2017   HGB 14.3 04/21/2017   HCT 44.3 04/21/2017   PLT 188.0 04/21/2017   GLUCOSE 364 (H) 04/21/2017   CHOL 133 04/21/2017   TRIG 267.0 (H) 04/21/2017   HDL 41.10 04/21/2017   LDLDIRECT 64.0 04/21/2017   LDLCALC 62 06/11/2016   ALT 16 04/21/2017   AST 19 04/21/2017   NA 138 04/21/2017   K 5.0 04/21/2017   CL 100 04/21/2017   CREATININE 1.05 04/21/2017   BUN 20 04/21/2017   CO2 28 04/21/2017   TSH 3.22 08/07/2015   INR 1.2 RATIO 04/19/2007   HGBA1C 11.3 (H) 04/21/2017   MICROALBUR 0.9 08/07/2015    Lab Results  Component Value Date   TSH 3.22 08/07/2015   Lab Results  Component Value Date   WBC 8.1 04/21/2017   HGB 14.3 04/21/2017   HCT 44.3 04/21/2017   MCV 88.7 04/21/2017   PLT 188.0 04/21/2017   Lab Results  Component Value Date   NA 138 04/21/2017   K 5.0 04/21/2017   CO2 28 04/21/2017   GLUCOSE 364 (H) 04/21/2017   BUN 20 04/21/2017   CREATININE 1.05 04/21/2017   BILITOT 0.6 04/21/2017   ALKPHOS 36 (L) 04/21/2017   AST 19 04/21/2017   ALT 16 04/21/2017   PROT 6.4 04/21/2017   ALBUMIN 3.8 04/21/2017   CALCIUM 10.2 04/21/2017   ANIONGAP 12 11/15/2016   GFR 55.70 (L) 04/21/2017   Lab Results  Component Value Date   CHOL 133 04/21/2017   Lab Results  Component Value Date   HDL 41.10 04/21/2017   Lab Results  Component Value Date   LDLCALC 62 06/11/2016   Lab Results  Component  Value Date   TRIG 267.0 (H) 04/21/2017   Lab Results  Component Value Date   CHOLHDL 3 04/21/2017   Lab Results  Component Value Date   HGBA1C 11.3 (H) 04/21/2017         Assessment & Plan:   Problem List Items Addressed This Visit    None    Visit Diagnoses    DM (diabetes mellitus)  type II uncontrolled, periph vascular disorder ([place])    -  Primary   Relevant Orders   Comprehensive metabolic panel (Completed)   Hemoglobin A1c (Completed)   Acute pansinusitis, recurrence not specified       Relevant Medications   promethazine-dextromethorphan (PROMETHAZINE-DM) 6.25-15 MG/5ML syrup   Other Relevant Orders   Comprehensive metabolic panel (Completed)   Hemoglobin A1c (Completed)   Lipid panel (Completed)   CBC with Differential/Platelet (Completed)   Acute bronchitis, unspecified organism       Relevant Medications   promethazine-dextromethorphan (PROMETHAZINE-DM) 6.25-15 MG/5ML syrup   Other Relevant Orders   Comprehensive metabolic panel (Completed)   Hemoglobin A1c (Completed)   Lipid panel (Completed)   CBC with Differential/Platelet (Completed)   DG Chest 2 View (Completed)      I have discontinued Ms. Spohr's insulin lispro, HUMALOG KWIKPEN, Dulaglutide, and beclomethasone. I am also having her start on promethazine-dextromethorphan. Additionally, I am having her maintain her acetaminophen, aspirin, NOVOFINE, ONE TOUCH ULTRA SYSTEM KIT, ONETOUCH DELICA LANCETS FINE, meloxicam, ONE TOUCH ULTRA TEST, ONE TOUCH ULTRA TEST, LUTEIN VISION BLEND, Bioflavonoid Products (ESTER C PO), MENEST, benazepril, ranitidine, Insulin Glargine, ezetimibe-simvastatin, glimepiride, FLUoxetine, metFORMIN, and fenofibrate. We will continue to administer ipratropium-albuterol.  Meds ordered this encounter  Medications  . promethazine-dextromethorphan (PROMETHAZINE-DM) 6.25-15 MG/5ML syrup    Sig: Take 5 mLs by mouth 4 (four) times daily as needed.    Dispense:  118 mL    Refill:  0  . DISCONTD: azithromycin (ZITHROMAX Z-PAK) 250 MG tablet    Sig: As directed    Dispense:  6 each    Refill:  0  CMA served as scribe during this visit. History, Physical and Plan performed by medical provider. Documentation and orders reviewed and attested to.    [name], DO

## 2017-04-21 NOTE — Patient Instructions (Signed)

## 2017-04-22 ENCOUNTER — Other Ambulatory Visit: Payer: Self-pay

## 2017-04-22 LAB — CBC WITH DIFFERENTIAL/PLATELET
Basophils Absolute: 0.1 10*3/uL (ref 0.0–0.1)
Basophils Relative: 1 % (ref 0.0–3.0)
Eosinophils Absolute: 0.2 10*3/uL (ref 0.0–0.7)
Eosinophils Relative: 2.4 % (ref 0.0–5.0)
HCT: 44.3 % (ref 36.0–46.0)
Hemoglobin: 14.3 g/dL (ref 12.0–15.0)
Lymphocytes Relative: 16.5 % (ref 12.0–46.0)
Lymphs Abs: 1.3 10*3/uL (ref 0.7–4.0)
MCHC: 32.4 g/dL (ref 30.0–36.0)
MCV: 88.7 fl (ref 78.0–100.0)
Monocytes Absolute: 0.8 10*3/uL (ref 0.1–1.0)
Monocytes Relative: 10.2 % (ref 3.0–12.0)
Neutro Abs: 5.6 10*3/uL (ref 1.4–7.7)
Neutrophils Relative %: 69.9 % (ref 43.0–77.0)
Platelets: 188 10*3/uL (ref 150.0–400.0)
RBC: 5 Mil/uL (ref 3.87–5.11)
RDW: 14.4 % (ref 11.5–15.5)
WBC: 8.1 10*3/uL (ref 4.0–10.5)

## 2017-04-22 LAB — LDL CHOLESTEROL, DIRECT: Direct LDL: 64 mg/dL

## 2017-04-22 LAB — COMPREHENSIVE METABOLIC PANEL
ALT: 16 U/L (ref 0–35)
AST: 19 U/L (ref 0–37)
Albumin: 3.8 g/dL (ref 3.5–5.2)
Alkaline Phosphatase: 36 U/L — ABNORMAL LOW (ref 39–117)
BUN: 20 mg/dL (ref 6–23)
CO2: 28 mEq/L (ref 19–32)
Calcium: 10.2 mg/dL (ref 8.4–10.5)
Chloride: 100 mEq/L (ref 96–112)
Creatinine, Ser: 1.05 mg/dL (ref 0.40–1.20)
GFR: 55.7 mL/min — ABNORMAL LOW (ref >60.00)
Glucose, Bld: 364 mg/dL — ABNORMAL HIGH (ref 70–99)
Potassium: 5 mEq/L (ref 3.5–5.1)
Sodium: 138 mEq/L (ref 135–145)
Total Bilirubin: 0.6 mg/dL (ref 0.2–1.2)
Total Protein: 6.4 g/dL (ref 6.0–8.3)

## 2017-04-22 LAB — LIPID PANEL
Cholesterol: 133 mg/dL (ref 0–200)
HDL: 41.1 mg/dL (ref >39.00)
NonHDL: 91.43
Total CHOL/HDL Ratio: 3
Triglycerides: 267 mg/dL — ABNORMAL HIGH (ref 0.0–149.0)
VLDL: 53.4 mg/dL — ABNORMAL HIGH (ref 0.0–40.0)

## 2017-04-22 LAB — HEMOGLOBIN A1C: Hgb A1c MFr Bld: 11.3 % — ABNORMAL HIGH (ref 4.6–6.5)

## 2017-04-22 MED ORDER — LEVOFLOXACIN 500 MG PO TABS: 500.0000 mg | ORAL_TABLET | Freq: Every day | ORAL | 0 refills | Status: DC

## 2017-04-25 ENCOUNTER — Other Ambulatory Visit: Payer: Self-pay | Admitting: Internal Medicine

## 2017-04-25 DIAGNOSIS — E1151 Type 2 diabetes mellitus with diabetic peripheral angiopathy without gangrene: Secondary | ICD-10-CM

## 2017-04-25 DIAGNOSIS — E1165 Type 2 diabetes mellitus with hyperglycemia: Principal | ICD-10-CM

## 2017-04-25 DIAGNOSIS — IMO0002 Reserved for concepts with insufficient information to code with codable children: Secondary | ICD-10-CM

## 2017-04-28 ENCOUNTER — Encounter: Payer: Self-pay | Admitting: Family Medicine

## 2017-04-28 DIAGNOSIS — E1165 Type 2 diabetes mellitus with hyperglycemia: Principal | ICD-10-CM

## 2017-04-28 DIAGNOSIS — E1151 Type 2 diabetes mellitus with diabetic peripheral angiopathy without gangrene: Secondary | ICD-10-CM

## 2017-04-28 DIAGNOSIS — IMO0002 Reserved for concepts with insufficient information to code with codable children: Secondary | ICD-10-CM

## 2017-04-29 MED ORDER — GLIMEPIRIDE 4 MG PO TABS: 4.0000 mg | ORAL_TABLET | Freq: Two times a day (BID) | ORAL | 0 refills | Status: DC

## 2017-05-10 ENCOUNTER — Other Ambulatory Visit: Payer: Self-pay | Admitting: Family Medicine

## 2017-05-29 ENCOUNTER — Encounter: Payer: Self-pay | Admitting: Family Medicine

## 2017-05-30 ENCOUNTER — Other Ambulatory Visit: Payer: Self-pay | Admitting: Family Medicine

## 2017-05-30 MED ORDER — FLUCONAZOLE 150 MG PO TABS: ORAL_TABLET | ORAL | 0 refills | Status: DC

## 2017-05-30 NOTE — Telephone Encounter (Signed)
Vaginal yeast ----  Diflucan 150 mg #2  1 po qd,  May repeat in 3 days prn

## 2017-06-07 ENCOUNTER — Encounter: Payer: Self-pay | Admitting: Family Medicine

## 2017-06-07 MED ORDER — MENEST 1.25 MG PO TABS: ORAL_TABLET | ORAL | 1 refills | Status: DC

## 2017-06-10 ENCOUNTER — Encounter: Payer: Self-pay | Admitting: Family Medicine

## 2017-06-10 ENCOUNTER — Telehealth: Payer: Self-pay | Admitting: Medical

## 2017-06-10 NOTE — Telephone Encounter (Signed)
Opened to review chart.

## 2017-06-10 NOTE — Telephone Encounter (Signed)
My chart message sent to me for Dr Laury AxonLowne pt. [facility] wants different alternative other than menest. Would you run this by Dr. Laury AxonLowne. Thanks.

## 2017-06-16 ENCOUNTER — Encounter: Payer: Self-pay | Admitting: Podiatry

## 2017-06-16 ENCOUNTER — Ambulatory Visit (INDEPENDENT_AMBULATORY_CARE_PROVIDER_SITE_OTHER): Payer: Medicare Other | Admitting: Podiatry

## 2017-06-16 ENCOUNTER — Other Ambulatory Visit: Payer: Self-pay | Admitting: Family Medicine

## 2017-06-16 DIAGNOSIS — E785 Hyperlipidemia, unspecified: Secondary | ICD-10-CM

## 2017-06-16 DIAGNOSIS — Z794 Long term (current) use of insulin: Secondary | ICD-10-CM

## 2017-06-16 DIAGNOSIS — E1151 Type 2 diabetes mellitus with diabetic peripheral angiopathy without gangrene: Secondary | ICD-10-CM | POA: Diagnosis not present

## 2017-06-16 DIAGNOSIS — B351 Tinea unguium: Secondary | ICD-10-CM

## 2017-06-16 DIAGNOSIS — L84 Corns and callosities: Secondary | ICD-10-CM | POA: Diagnosis not present

## 2017-06-16 DIAGNOSIS — M79676 Pain in unspecified toe(s): Secondary | ICD-10-CM

## 2017-06-16 MED ORDER — EZETIMIBE-SIMVASTATIN 10-40 MG PO TABS: ORAL_TABLET | ORAL | 0 refills | Status: DC

## 2017-06-16 NOTE — Progress Notes (Signed)
Patient ID: Andrea Marks, female   DOB: 05/09/1951, 66 y.o.   MRN: [id number] Complaint:  Visit Type: Patient returns to my office for continued preventative foot care services. Complaint: Patient states" my nails have grown long and thick and become painful to walk and wear shoes".  Painful callus on bottom of both big toes. Patient has been diagnosed with DM with neuropathy.. The patient presents for preventative foot care services. No changes to ROS  Podiatric Exam: Vascular: dorsalis pedis and posterior tibial pulses are not palpable bilateral due to foot swelling.. Capillary return is immediate. Temperature gradient is WNL. Skin turgor WNL  Sensorium: Diminished Semmes Weinstein monofilament test. Normal tactile sensation bilaterally. Nail Exam: Pt has thick disfigured discolored nails with subungual debris noted bilateral entire nail hallux through fifth toenails Ulcer Exam: There is no evidence of ulcer or pre-ulcerative changes or infection. Orthopedic Exam: Muscle tone and strength are WNL. No limitations in general ROM. No crepitus or effusions noted. Foot type and digits show no abnormalities. Bony prominences are unremarkable. Skin: No Porokeratosis. No infection or ulcers.  Pinch callus hallux B/L.  Diagnosis:  Onychomycosis, , Pain in right toe, pain in left toes,  Callus B/L  Treatment & Plan Procedures and Treatment: Consent by patient was obtained for treatment procedures. The patient understood the discussion of treatment and procedures well. All questions were answered thoroughly reviewed. Debridement of mycotic and hypertrophic toenails, 1 through 5 bilateral and clearing of subungual debris. No ulceration, no infection noted. Return Visit-Office Procedure: Patient instructed to return to the office for a follow up visit 10 weeks  for continued evaluation and treatment.   Markella Dao DPM 

## 2017-06-28 ENCOUNTER — Encounter: Payer: Self-pay | Admitting: Family Medicine

## 2017-06-28 MED ORDER — INSULIN PEN NEEDLE 32G X 6 MM MISC: 3 refills | Status: DC

## 2017-07-05 ENCOUNTER — Encounter: Payer: Self-pay | Admitting: Family Medicine

## 2017-07-06 MED ORDER — MENEST 1.25 MG PO TABS: ORAL_TABLET | ORAL | 1 refills | Status: DC

## 2017-07-07 ENCOUNTER — Encounter: Payer: Self-pay | Admitting: Family Medicine

## 2017-07-11 MED ORDER — ESTRADIOL 1 MG PO TABS: 1.0000 mg | ORAL_TABLET | Freq: Every day | ORAL | 11 refills | Status: DC

## 2017-07-11 NOTE — Telephone Encounter (Signed)
Estradiol 1 mg po qd #30 11 refills

## 2017-08-02 ENCOUNTER — Other Ambulatory Visit: Payer: Self-pay | Admitting: Family Medicine

## 2017-08-08 ENCOUNTER — Other Ambulatory Visit: Payer: Self-pay | Admitting: Family Medicine

## 2017-08-08 MED ORDER — FENOFIBRATE 160 MG PO TABS: 160.0000 mg | ORAL_TABLET | Freq: Every day | ORAL | 0 refills | Status: DC

## 2017-08-10 ENCOUNTER — Other Ambulatory Visit: Payer: Self-pay | Admitting: Family Medicine

## 2017-09-28 ENCOUNTER — Encounter: Payer: Self-pay | Admitting: Podiatry

## 2017-09-28 ENCOUNTER — Ambulatory Visit (INDEPENDENT_AMBULATORY_CARE_PROVIDER_SITE_OTHER): Payer: Medicare Other | Admitting: Podiatry

## 2017-09-28 DIAGNOSIS — B351 Tinea unguium: Secondary | ICD-10-CM

## 2017-09-28 DIAGNOSIS — Z794 Long term (current) use of insulin: Secondary | ICD-10-CM

## 2017-09-28 DIAGNOSIS — M79676 Pain in unspecified toe(s): Secondary | ICD-10-CM | POA: Diagnosis not present

## 2017-09-28 DIAGNOSIS — L84 Corns and callosities: Secondary | ICD-10-CM

## 2017-09-28 DIAGNOSIS — E1151 Type 2 diabetes mellitus with diabetic peripheral angiopathy without gangrene: Secondary | ICD-10-CM

## 2017-09-28 NOTE — Progress Notes (Signed)
Patient ID: [name], female   DOB: 1951/09/19, 66 y.o.   MRN: [id number] Complaint:  Visit Type: Patient returns to my office for continued preventative foot care services. Complaint: Patient states" my nails have grown long and thick and become painful to walk and wear shoes".  Painful callus on bottom of both big toes. Patient has been diagnosed with DM with neuropathy.. The patient presents for preventative foot care services. No changes to ROS  Podiatric Exam: Vascular: dorsalis pedis and posterior tibial pulses are not palpable bilateral due to foot swelling.. Capillary return is immediate. Temperature gradient is WNL. Skin turgor WNL  Sensorium: Diminished Semmes Weinstein monofilament test. Normal tactile sensation bilaterally. Nail Exam: Pt has thick disfigured discolored nails with subungual debris noted bilateral entire nail hallux through fifth toenails Ulcer Exam: There is no evidence of ulcer or pre-ulcerative changes or infection. Orthopedic Exam: Muscle tone and strength are WNL. No limitations in general ROM. No crepitus or effusions noted. Foot type and digits show no abnormalities. Bony prominences are unremarkable. Skin: No Porokeratosis. No infection or ulcers.  Pinch callus hallux B/L.  Diagnosis:  Onychomycosis, , Pain in right toe, pain in left toes,  Callus B/L  Treatment & Plan Procedures and Treatment: Consent by patient was obtained for treatment procedures. The patient understood the discussion of treatment and procedures well. All questions were answered thoroughly reviewed. Debridement of mycotic and hypertrophic toenails, 1 through 5 bilateral and clearing of subungual debris. No ulceration, no infection noted. Return Visit-Office Procedure: Patient instructed to return to the office for a follow up visit 10 weeks  for continued evaluation and treatment.   Helane GuntherGregory Kendra Woolford DPM

## 2017-09-29 ENCOUNTER — Other Ambulatory Visit: Payer: Self-pay | Admitting: Family Medicine

## 2017-09-29 DIAGNOSIS — E1151 Type 2 diabetes mellitus with diabetic peripheral angiopathy without gangrene: Secondary | ICD-10-CM

## 2017-09-29 DIAGNOSIS — IMO0002 Reserved for concepts with insufficient information to code with codable children: Secondary | ICD-10-CM

## 2017-09-29 DIAGNOSIS — E1165 Type 2 diabetes mellitus with hyperglycemia: Principal | ICD-10-CM

## 2017-10-26 ENCOUNTER — Other Ambulatory Visit: Payer: Self-pay | Admitting: Family Medicine

## 2017-10-26 DIAGNOSIS — E785 Hyperlipidemia, unspecified: Secondary | ICD-10-CM

## 2017-10-26 DIAGNOSIS — E1151 Type 2 diabetes mellitus with diabetic peripheral angiopathy without gangrene: Secondary | ICD-10-CM

## 2017-10-26 DIAGNOSIS — E1165 Type 2 diabetes mellitus with hyperglycemia: Principal | ICD-10-CM

## 2017-10-26 DIAGNOSIS — IMO0002 Reserved for concepts with insufficient information to code with codable children: Secondary | ICD-10-CM

## 2017-10-26 MED ORDER — EZETIMIBE-SIMVASTATIN 10-40 MG PO TABS: 1.0000 | ORAL_TABLET | Freq: Every day | ORAL | 0 refills | Status: DC

## 2017-10-26 MED ORDER — GLIMEPIRIDE 4 MG PO TABS: 4.0000 mg | ORAL_TABLET | Freq: Two times a day (BID) | ORAL | 0 refills | Status: DC

## 2017-11-04 ENCOUNTER — Other Ambulatory Visit: Payer: Self-pay | Admitting: Family Medicine

## 2017-11-10 ENCOUNTER — Other Ambulatory Visit: Payer: Self-pay | Admitting: Family Medicine

## 2017-12-19 ENCOUNTER — Encounter: Payer: Self-pay | Admitting: Family Medicine

## 2017-12-19 ENCOUNTER — Ambulatory Visit (INDEPENDENT_AMBULATORY_CARE_PROVIDER_SITE_OTHER): Payer: Medicare Other | Admitting: Family Medicine

## 2017-12-19 VITALS — BP 120/60 | HR 71 | Temp 98.5°F | Resp 16 | Ht 63.0 in | Wt 254.6 lb

## 2017-12-19 DIAGNOSIS — E1151 Type 2 diabetes mellitus with diabetic peripheral angiopathy without gangrene: Secondary | ICD-10-CM

## 2017-12-19 DIAGNOSIS — E785 Hyperlipidemia, unspecified: Secondary | ICD-10-CM | POA: Diagnosis not present

## 2017-12-19 DIAGNOSIS — E1165 Type 2 diabetes mellitus with hyperglycemia: Secondary | ICD-10-CM

## 2017-12-19 DIAGNOSIS — Z794 Long term (current) use of insulin: Secondary | ICD-10-CM | POA: Diagnosis not present

## 2017-12-19 DIAGNOSIS — I10 Essential (primary) hypertension: Secondary | ICD-10-CM

## 2017-12-19 DIAGNOSIS — IMO0002 Reserved for concepts with insufficient information to code with codable children: Secondary | ICD-10-CM

## 2017-12-19 DIAGNOSIS — Z780 Asymptomatic menopausal state: Secondary | ICD-10-CM | POA: Diagnosis not present

## 2017-12-19 LAB — LIPID PANEL
Cholesterol: 115 mg/dL (ref 0–200)
HDL: 48.4 mg/dL (ref >39.00)
LDL Cholesterol: 33 mg/dL (ref 0–99)
NonHDL: 66.13
Total CHOL/HDL Ratio: 2
Triglycerides: 164 mg/dL — ABNORMAL HIGH (ref 0.0–149.0)
VLDL: 32.8 mg/dL (ref 0.0–40.0)

## 2017-12-19 LAB — COMPREHENSIVE METABOLIC PANEL
ALT: 12 U/L (ref 0–35)
AST: 12 U/L (ref 0–37)
Albumin: 3.8 g/dL (ref 3.5–5.2)
Alkaline Phosphatase: 31 U/L — ABNORMAL LOW (ref 39–117)
BUN: 19 mg/dL (ref 6–23)
CO2: 28 mEq/L (ref 19–32)
Calcium: 9 mg/dL (ref 8.4–10.5)
Chloride: 99 mEq/L (ref 96–112)
Creatinine, Ser: 0.91 mg/dL (ref 0.40–1.20)
GFR: 65.57 mL/min (ref >60.00)
Glucose, Bld: 278 mg/dL — ABNORMAL HIGH (ref 70–99)
Potassium: 4.7 mEq/L (ref 3.5–5.1)
Sodium: 137 mEq/L (ref 135–145)
Total Bilirubin: 0.5 mg/dL (ref 0.2–1.2)
Total Protein: 6.5 g/dL (ref 6.0–8.3)

## 2017-12-19 LAB — POC URINALSYSI DIPSTICK (AUTOMATED)
Bilirubin, UA: NEGATIVE
Blood, UA: NEGATIVE
Ketones, UA: NEGATIVE
Leukocytes, UA: NEGATIVE
Nitrite, UA: NEGATIVE
Protein, UA: NEGATIVE
Spec Grav, UA: >=1.03 — AB (ref 1.010–1.025)
Urobilinogen, UA: 0.2 E.U./dL
pH, UA: 6 (ref 5.0–8.0)

## 2017-12-19 LAB — MICROALBUMIN / CREATININE URINE RATIO
Creatinine,U: 120.4 mg/dL
Microalb Creat Ratio: 1.7 mg/g (ref 0.0–30.0)
Microalb, Ur: 2 mg/dL — ABNORMAL HIGH (ref 0.0–1.9)

## 2017-12-19 LAB — HEMOGLOBIN A1C: Hgb A1c MFr Bld: 11.2 % — ABNORMAL HIGH (ref 4.6–6.5)

## 2017-12-19 MED ORDER — FENOFIBRATE 160 MG PO TABS: 160.0000 mg | ORAL_TABLET | Freq: Every day | ORAL | 1 refills | Status: DC

## 2017-12-19 MED ORDER — BENAZEPRIL HCL 20 MG PO TABS: 20.0000 mg | ORAL_TABLET | Freq: Every day | ORAL | 1 refills | Status: DC

## 2017-12-19 MED ORDER — ESTRADIOL 1 MG PO TABS: 1.0000 mg | ORAL_TABLET | Freq: Every day | ORAL | 1 refills | Status: DC

## 2017-12-19 NOTE — Progress Notes (Signed)
Patient ID: Andrea Marks, female    DOB: [birth year]  Age: 67 y.o. MRN: [id number]    Subjective:  Subjective  HPI Andrea Marks presents for f/u    HYPERTENSION   Blood pressure range-not checking   Chest pain- no      Dyspnea- no Lightheadedness- no   Edema- no  Other side effects - no   Medication compliance: good Low salt diet- yes    DIABETES    Blood Sugar ranges-not checking   Polyuria- no New Visual problems- no  Hypoglycemic symptoms- no  Other side effects-no Medication compliance - good Last eye exam- due-- app next week Foot exam- per podiatry   HYPERLIPIDEMIA  Medication compliance- good RUQ pain- no  Muscle aches- no Other side effects-no   Review of Systems  Constitutional: Negative for appetite change, diaphoresis, fatigue and unexpected weight change.  Eyes: Negative for pain, redness and visual disturbance.  Respiratory: Negative for cough, chest tightness, shortness of breath and wheezing.   Cardiovascular: Negative for chest pain, palpitations and leg swelling.  Endocrine: Negative for cold intolerance, heat intolerance, polydipsia, polyphagia and polyuria.  Genitourinary: Negative for difficulty urinating, dysuria and frequency.  Neurological: Negative for dizziness, light-headedness, numbness and headaches.    History Past Medical History:  Diagnosis Date  . Depression   . Diabetes mellitus   . GERD (gastroesophageal reflux disease)   . Hyperlipidemia   . Hypertension   . Obesity     She has a past surgical history that includes Abdominal hysterectomy (1991).   Her family history includes Aortic stenosis in her mother; Cancer in her maternal aunt and other; Diabetes in her paternal grandmother, paternal uncle, paternal uncle, and paternal uncle; Heart disease in her mother, paternal uncle, paternal uncle, paternal uncle, paternal uncle, and paternal uncle; Lung cancer in her unknown relative; Lung disease (age of onset: 23) in  her father; Macular degeneration in her mother.She reports that she has quit smoking. she has never used smokeless tobacco. She reports that she does not drink alcohol or use drugs.  Current Outpatient Medications on File Prior to Visit  Medication Sig Dispense Refill  . acetaminophen (TYLENOL ARTHRITIS PAIN) 650 MG CR tablet Take 650 mg by mouth 2 (two) times daily as needed. For pain    . aspirin 81 MG tablet Take 81 mg by mouth daily.      . Blood Glucose Monitoring Suppl (ONE TOUCH ULTRA SYSTEM KIT) W/DEVICE KIT 1 kit by Does not apply route once. 1 each 0  . ezetimibe-simvastatin (VYTORIN) 10-40 MG tablet Take 1 tablet by mouth at bedtime. 90 tablet 0  . FLUoxetine (PROZAC) 20 MG capsule TAKE 3 CAPSULES DAILY 270 capsule 0  . glimepiride (AMARYL) 4 MG tablet Take 1 tablet (4 mg total) by mouth 2 (two) times daily. 180 tablet 0  . Insulin Glargine (LANTUS SOLOSTAR) 100 UNIT/ML Solostar Pen 45 units subcue daily at Bedtime 90 mL 1  . Insulin Pen Needle (NOVOFINE) 32G X 6 MM MISC To use w/ Lantus 100 each 3  . metFORMIN (GLUCOPHAGE-XR) 750 MG 24 hr tablet TAKE 1 TABLET THREE TIMES A DAY 270 tablet 1  . ONE TOUCH ULTRA TEST test strip USE TO TEST BLOOD SUGAR 2 TIMES DAILY AS INSTRUCTED. DX: E11.65 50 each 3  . ONE TOUCH ULTRA TEST test strip USE TO TEST BLOOD SUGAR TWO TIMES DAILY AS INSTRUCTED 200 each 0  . ONETOUCH DELICA LANCETS FINE MISC USE AS INSTRUCTED 1 each 9  . ranitidine (  ZANTAC) 300 MG capsule TAKE 1 CAPSULE DAILY (SCHEDULE APPOINTMENT) 90 capsule 0   Current Facility-Administered Medications on File Prior to Visit  Medication Dose Route Frequency Provider Last Rate Last Dose  . ipratropium-albuterol (DUONEB) 0.5-2.5 (3) MG/3ML nebulizer solution 3 mL  3 mL Nebulization Q6H Copland, Jessica C, MD   3 mL at 11/15/16 1330     Objective:  Objective  Physical Exam  Constitutional: She is oriented to person, place, and time. She appears well-developed and well-nourished.  HENT:    Head: Normocephalic and atraumatic.  Eyes: Conjunctivae and EOM are normal.  Neck: Normal range of motion. Neck supple. No JVD present. Carotid bruit is not present. No thyromegaly present.  Cardiovascular: Normal rate, regular rhythm and normal heart sounds.  No murmur heard. Pulmonary/Chest: Effort normal and breath sounds normal. No respiratory distress. She has no wheezes. She has no rales. She exhibits no tenderness.  Musculoskeletal: She exhibits no edema.  Neurological: She is alert and oriented to person, place, and time.  Psychiatric: She has a normal mood and affect.  Nursing note and vitals reviewed.  BP 120/60 (BP Location: Left Arm, Cuff Size: Large)   Pulse 71   Temp 98.5 F (36.9 C) (Oral)   Resp 16   Ht _0  (1.6 m)   Wt 254 lb 9.6 oz (115.5 kg)   SpO2 96%   BMI 45.10 kg/m  Wt Readings from Last 3 Encounters:  12/19/17 254 lb 9.6 oz (115.5 kg)  04/21/17 263 lb 12.8 oz (119.7 kg)  01/24/17 274 lb (124.3 kg)     Lab Results  Component Value Date   WBC 8.1 04/21/2017   HGB 14.3 04/21/2017   HCT 44.3 04/21/2017   PLT 188.0 04/21/2017   GLUCOSE 364 (H) 04/21/2017   CHOL 133 04/21/2017   TRIG 267.0 (H) 04/21/2017   HDL 41.10 04/21/2017   LDLDIRECT 64.0 04/21/2017   LDLCALC 62 06/11/2016   ALT 16 04/21/2017   AST 19 04/21/2017   NA 138 04/21/2017   K 5.0 04/21/2017   CL 100 04/21/2017   CREATININE 1.05 04/21/2017   BUN 20 04/21/2017   CO2 28 04/21/2017   TSH 3.22 08/07/2015   INR 1.2 RATIO 04/19/2007   HGBA1C 11.3 (H) 04/21/2017   MICROALBUR 0.9 08/07/2015    Dg Chest 2 View  Result Date: 04/21/2017 CLINICAL DATA:  Congestion and nonproductive cough for few days, history hypertension, diabetes mellitus, former smoker EXAM: CHEST  2 VIEW COMPARISON:  11/15/2016 FINDINGS: Normal heart size, mediastinal contours, and pulmonary vascularity. Bronchitic changes with mild RIGHT basilar opacity question pneumonia. Remaining lungs clear. No pleural effusion  or pneumothorax. Bones demineralized. IMPRESSION: Bronchitic changes with questionable RIGHT basilar infiltrate suspicious for pneumonia. Electronically Signed   By: Lavonia Dana M.D.   On: 04/21/2017 17:37     Assessment & Plan:  Plan  I have discontinued Andrea Marks's meloxicam, LUTEIN VISION BLEND, Bioflavonoid Products (ESTER C PO), promethazine-dextromethorphan, levofloxacin, and fluconazole. I have also changed her benazepril. Additionally, I am having her maintain her acetaminophen, aspirin, ONE TOUCH ULTRA SYSTEM KIT, ONETOUCH DELICA LANCETS FINE, ONE TOUCH ULTRA TEST, ONE TOUCH ULTRA TEST, Insulin Glargine, Insulin Pen Needle, metFORMIN, glimepiride, ezetimibe-simvastatin, ranitidine, FLUoxetine, estradiol, and fenofibrate. We will continue to administer ipratropium-albuterol.  Meds ordered this encounter  Medications  . benazepril (LOTENSIN) 20 MG tablet    Sig: Take 1 tablet (20 mg total) by mouth daily.    Dispense:  90 tablet    Refill:  1  . estradiol (ESTRACE) 1 MG tablet    Sig: Take 1 tablet (1 mg total) by mouth daily.    Dispense:  90 tablet    Refill:  1  . fenofibrate 160 MG tablet    Sig: Take 1 tablet (160 mg total) by mouth daily.    Dispense:  90 tablet    Refill:  1    Problem List Items Addressed This Visit      Unprioritized   Essential hypertension    Well controlled, no changes to meds. Encouraged heart healthy diet such as the DASH diet and exercise as tolerated.       Relevant Medications   benazepril (LOTENSIN) 20 MG tablet   fenofibrate 160 MG tablet   Other Relevant Orders   Hemoglobin A1c   Comprehensive metabolic panel   Hyperlipidemia LDL goal <70    Tolerating statin, encouraged heart healthy diet, avoid trans fats, minimize simple carbs and saturated fats. Increase exercise as tolerated      Relevant Medications   benazepril (LOTENSIN) 20 MG tablet   fenofibrate 160 MG tablet   Other Relevant Orders   Comprehensive metabolic  panel   Lipid panel   Type 2 diabetes mellitus with hyperglycemia, with long-term current use of insulin (HCC)    hgba1c to be done, minimize simple carbs. Increase exercise as tolerated. Continue current meds Pt needs f/u with Endo       Relevant Medications   benazepril (LOTENSIN) 20 MG tablet    Other Visit Diagnoses    DM (diabetes mellitus) type II uncontrolled, periph vascular disorder ([place])    -  Primary   Relevant Medications   benazepril (LOTENSIN) 20 MG tablet   fenofibrate 160 MG tablet   Other Relevant Orders   Microalbumin / creatinine urine ratio   POCT Urinalysis Dipstick (Automated)   Ambulatory referral to Endocrinology   Menopause       Relevant Medications   estradiol (ESTRACE) 1 MG tablet      Follow-up: Return in about 6 months (around 06/18/2018) for annual exam, fasting.  [name], DO

## 2017-12-19 NOTE — Assessment & Plan Note (Signed)
hgba1c to be done, minimize simple carbs. Increase exercise as tolerated. Continue current meds Pt needs f/u with Endo

## 2017-12-19 NOTE — Patient Instructions (Signed)

## 2017-12-19 NOTE — Assessment & Plan Note (Signed)
Tolerating statin, encouraged heart healthy diet, avoid trans fats, minimize simple carbs and saturated fats. Increase exercise as tolerated 

## 2017-12-19 NOTE — Assessment & Plan Note (Signed)
Well controlled, no changes to meds. Encouraged heart healthy diet such as the DASH diet and exercise as tolerated.  °

## 2017-12-21 ENCOUNTER — Ambulatory Visit (INDEPENDENT_AMBULATORY_CARE_PROVIDER_SITE_OTHER): Payer: Medicare Other

## 2017-12-21 ENCOUNTER — Encounter: Payer: Self-pay | Admitting: Podiatry

## 2017-12-21 ENCOUNTER — Ambulatory Visit (INDEPENDENT_AMBULATORY_CARE_PROVIDER_SITE_OTHER): Payer: Medicare Other | Admitting: Podiatry

## 2017-12-21 ENCOUNTER — Encounter: Payer: Self-pay | Admitting: Family Medicine

## 2017-12-21 DIAGNOSIS — Z794 Long term (current) use of insulin: Secondary | ICD-10-CM | POA: Diagnosis not present

## 2017-12-21 DIAGNOSIS — E1151 Type 2 diabetes mellitus with diabetic peripheral angiopathy without gangrene: Secondary | ICD-10-CM | POA: Diagnosis not present

## 2017-12-21 DIAGNOSIS — B351 Tinea unguium: Secondary | ICD-10-CM | POA: Diagnosis not present

## 2017-12-21 DIAGNOSIS — S99922A Unspecified injury of left foot, initial encounter: Secondary | ICD-10-CM | POA: Diagnosis not present

## 2017-12-21 DIAGNOSIS — T1490XA Injury, unspecified, initial encounter: Secondary | ICD-10-CM

## 2017-12-21 DIAGNOSIS — M79676 Pain in unspecified toe(s): Secondary | ICD-10-CM

## 2017-12-21 NOTE — Progress Notes (Signed)
Patient ID: [name], female   DOB: [birth year], 67 y.o.   MRN: [id number] Complaint:  Visit Type: Patient returns to my office for continued preventative foot care services. Complaint: Patient states" my nails have grown long and thick and become painful to walk and wear shoes".  Painful callus on bottom of both big toes. Patient has been diagnosed with DM with neuropathy.. The patient presents for preventative foot care services. No changes to ROS.  She also admits to having dropped her cell phone on her left big toe.  She says her foot has become blue and discolored at the base of the second through fourth toes left foot.  Patient says her big toe is painful walking and wearing her shoes.  Podiatric Exam: Vascular: dorsalis pedis are weakly palpable. Posterior tibial pulses are not palpable bilateral due to foot swelling.. Capillary return is immediate. Temperature gradient is WNL. Skin turgor WNL  Sensorium: Normal  Semmes Weinstein monofilament test. Normal tactile sensation bilaterally. Nail Exam: Pt has thick disfigured discolored nails with subungual debris noted bilateral entire nail hallux through fifth toenails Ulcer Exam: There is no evidence of ulcer or pre-ulcerative changes or infection. Orthopedic Exam: Muscle tone and strength are WNL. No limitations in general ROM. No crepitus or effusions noted. Foot type and digits show no abnormalities. Bony prominences are unremarkable. Skin: No Porokeratosis. No infection or ulcers.  Pinch callus hallux B/L.  Diagnosis:  Onychomycosis, , Pain in right toe, pain in left toes,  Callus B/L  Treatment & Plan Procedures and Treatment: Consent by patient was obtained for treatment procedures. The patient understood the discussion of treatment and procedures well. All questions were answered thoroughly reviewed. Debridement of mycotic and hypertrophic toenails, 1 through 5 bilateral and clearing of subungual debris. No ulceration, no infection  noted. X-rays reveal no bony pathology noted to her left foot.  Her x-ray does reveal previous surgery at the level of the first MPJ.  Patient was diagnosed with a toe contusion left foot and dispensed a surgical shoe to wear when she walks.   RTC  3 months.  Her vascular,LOPS and muscle power findings are found to be WNL. Return Visit-Office Procedure: Patient instructed to return to the office for a follow up visit 10 weeks  for continued evaluation and treatment.   Helane GuntherGregory Tamarick Kovalcik DPM

## 2017-12-22 ENCOUNTER — Ambulatory Visit (INDEPENDENT_AMBULATORY_CARE_PROVIDER_SITE_OTHER): Payer: Medicare Other | Admitting: Internal Medicine

## 2017-12-22 ENCOUNTER — Encounter: Payer: Self-pay | Admitting: Internal Medicine

## 2017-12-22 VITALS — BP 128/64 | HR 70 | Temp 98.6°F | Wt 255.1 lb

## 2017-12-22 DIAGNOSIS — Z794 Long term (current) use of insulin: Secondary | ICD-10-CM | POA: Diagnosis not present

## 2017-12-22 DIAGNOSIS — E1165 Type 2 diabetes mellitus with hyperglycemia: Secondary | ICD-10-CM

## 2017-12-22 MED ORDER — DULAGLUTIDE 1.5 MG/0.5ML ~~LOC~~ SOAJ: SUBCUTANEOUS | 3 refills | Status: DC

## 2017-12-22 MED ORDER — DULAGLUTIDE 1.5 MG/0.5ML ~~LOC~~ SOAJ: SUBCUTANEOUS | 1 refills | Status: DC

## 2017-12-22 NOTE — Progress Notes (Signed)
Patient ID: Andrea Marks, female   DOB: Oct 01, 1951, 67 y.o.   MRN: [id number]  HPI: Andrea Marks is a 67 y.o.-year-old female, returning for f/u for DM2, dx 1994, insulin-dependent, uncontrolled, without long-term complications. Last visit was 2 years ago!   Last hemoglobin A1c was: Lab Results  Component Value Date   HGBA1C 11.2 (H) 12/19/2017   HGBA1C 11.3 (H) 04/21/2017   HGBA1C 7.8 (H) 06/11/2016   Pt is on: - Metformin 750 mg x3 at dinnertime - Amaryl 4 mg 2x a day  - no relationship with meals - Lantus 45 units at bedtime  Tried Bydureon once a week >> she had problems injecting the medication. She stopped Victoza then and did not restart.  Not checking sugars!  No CKD, last BUN/creatinine was:  Lab Results  Component Value Date   BUN 19 12/19/2017   CREATININE 0.91 12/19/2017  On benazepril. + HL; Last set of lipids: Lab Results  Component Value Date   CHOL 115 12/19/2017   HDL 48.40 12/19/2017   LDLCALC 33 12/19/2017   LDLDIRECT 64.0 04/21/2017   TRIG 164.0 (H) 12/19/2017   CHOLHDL 2 12/19/2017  On Vytorin. Also started Fenofibrate. Pt's last dilated eye exam was in 12/2016: No DR. Has this coming up. No numbness and tingling in her legs. She sees a Art therapist - Dr. Prudence Davidson.  She had an ulcer in lower right leg (has lymphedema)  She also has a history of  HTN, GERD, depression, h/o thrombophlebitis.  ROS: Constitutional: no weight gain/no weight loss, no fatigue, no subjective hyperthermia, no subjective hypothermia, + nocturia Eyes: no blurry vision, no xerophthalmia ENT: no sore throat, no nodules palpated in throat, no dysphagia, no odynophagia, no hoarseness Cardiovascular: no CP/no SOB/no palpitations/no leg swelling Respiratory: + cough/no SOB/+ wheezing Gastrointestinal: no N/no V/no D/no C/no acid reflux Musculoskeletal: + muscle aches/+ joint aches Skin: no rashes, no hair loss Neurological: no tremors/no numbness/no tingling/no  dizziness  I reviewed pt's medications, allergies, PMH, social hx, family hx, and changes were documented in the history of present illness. Otherwise, unchanged from my initial visit note.  Past Medical History:  Diagnosis Date  . Depression   . Diabetes mellitus   . GERD (gastroesophageal reflux disease)   . Hyperlipidemia   . Hypertension   . Obesity    Past Surgical History:  Procedure Laterality Date  . ABDOMINAL HYSTERECTOMY  1991   BSO   Social History   Socioeconomic History  . Marital status: Married    Spouse name: Not on file  . Number of children: 0  . Years of education: 56  . Highest education level: Not on file  Social Needs  . Financial resource strain: Not on file  . Food insecurity - worry: Not on file  . Food insecurity - inability: Not on file  . Transportation needs - medical: Not on file  . Transportation needs - non-medical: Not on file  Occupational History  . Occupation: retired from [employer], [employer]: RETIRED  Tobacco Use  . Smoking status: Former Research scientist (life sciences)  . Smokeless tobacco: Never Used  Substance and Sexual Activity  . Alcohol use: No  . Drug use: No  . Sexual activity: Yes    Partners: Male  Other Topics Concern  . Not on file  Social History Narrative   No reg exercise   Current Outpatient Medications on File Prior to Visit  Medication Sig Dispense Refill  . acetaminophen (TYLENOL ARTHRITIS PAIN) 650 MG CR tablet  Take 650 mg by mouth 2 (two) times daily as needed. For pain    . aspirin 81 MG tablet Take 81 mg by mouth daily.      . benazepril (LOTENSIN) 20 MG tablet Take 1 tablet (20 mg total) by mouth daily. 90 tablet 1  . Blood Glucose Monitoring Suppl (ONE TOUCH ULTRA SYSTEM KIT) W/DEVICE KIT 1 kit by Does not apply route once. 1 each 0  . estradiol (ESTRACE) 1 MG tablet Take 1 tablet (1 mg total) by mouth daily. 90 tablet 1  . ezetimibe-simvastatin (VYTORIN) 10-40 MG tablet Take 1 tablet by mouth at bedtime. 90 tablet 0  .  fenofibrate 160 MG tablet Take 1 tablet (160 mg total) by mouth daily. 90 tablet 1  . FLUoxetine (PROZAC) 20 MG capsule TAKE 3 CAPSULES DAILY 270 capsule 0  . glimepiride (AMARYL) 4 MG tablet Take 1 tablet (4 mg total) by mouth 2 (two) times daily. 180 tablet 0  . Insulin Glargine (LANTUS SOLOSTAR) 100 UNIT/ML Solostar Pen 45 units subcue daily at Bedtime 90 mL 1  . Insulin Pen Needle (NOVOFINE) 32G X 6 MM MISC To use w/ Lantus 100 each 3  . metFORMIN (GLUCOPHAGE-XR) 750 MG 24 hr tablet TAKE 1 TABLET THREE TIMES A DAY 270 tablet 1  . ONE TOUCH ULTRA TEST test strip USE TO TEST BLOOD SUGAR 2 TIMES DAILY AS INSTRUCTED. DX: E11.65 50 each 3  . ONE TOUCH ULTRA TEST test strip USE TO TEST BLOOD SUGAR TWO TIMES DAILY AS INSTRUCTED 200 each 0  . ONETOUCH DELICA LANCETS FINE MISC USE AS INSTRUCTED 1 each 9  . ranitidine (ZANTAC) 300 MG capsule TAKE 1 CAPSULE DAILY (SCHEDULE APPOINTMENT) 90 capsule 0   Current Facility-Administered Medications on File Prior to Visit  Medication Dose Route Frequency Provider Last Rate Last Dose  . ipratropium-albuterol (DUONEB) 0.5-2.5 (3) MG/3ML nebulizer solution 3 mL  3 mL Nebulization Q6H Copland, Jessica C, MD   3 mL at 11/15/16 1330   Allergies  Allergen Reactions  . Hydrocodone-Acetaminophen Other (See Comments)    unknown  . Oxycodone Hcl Other (See Comments)    unknown  . Penicillins Other (See Comments)    unknown  . Prednisone    Family History  Problem Relation Age of Onset  . Macular degeneration Mother   . Heart disease Mother        syncope  . Aortic stenosis Mother   . Lung disease Father 10       mesothelioma  . Cancer Maternal Aunt        stomach  . Heart disease Paternal Uncle        cabg  . Diabetes Paternal Uncle   . Heart disease Paternal Uncle   . Diabetes Paternal Uncle   . Diabetes Paternal Uncle   . Heart disease Paternal Uncle   . Heart disease Paternal Uncle   . Heart disease Paternal Uncle   . Lung cancer Unknown         asbestos  . Diabetes Paternal Grandmother   . Cancer Other        lung   PE: BP 128/64 (BP Location: Left Arm, Patient Position: Sitting, Cuff Size: Normal)   Pulse 70   Temp 98.6 F (37 C) (Oral)   Wt 255 lb 2 oz (115.7 kg)   SpO2 95%   BMI 45.19 kg/m  Wt Readings from Last 3 Encounters:  12/22/17 255 lb 2 oz (115.7 kg)  12/19/17 254 lb 9.6  oz (115.5 kg)  04/21/17 263 lb 12.8 oz (119.7 kg)   Constitutional: Obese, in NAD Eyes: PERRLA, EOMI, no exophthalmos ENT: moist mucous membranes, no thyromegaly, no cervical lymphadenopathy Cardiovascular: RRR, No MRG Respiratory: CTA B Gastrointestinal: abdomen soft, NT, ND, BS+ Musculoskeletal: no deformities, strength intact in all 4 Skin: moist, warm, no rashes Neurological: no tremor with outstretched hands, DTR normal in all 4  ASSESSMENT: 1. DM2, insulin-dependent, uncontrolled, without complications  2. Obesity class 3  PLAN:  1. Patient with long-standing diabetes, on basal-bolus insulin regimen, and GLP-1 receptor agonist, and oral regimen. She returns after a 2-year absence.  She is not compliant with her visits and in the past she was also forgetting medication doses. - Reviewed together her latest HbA1c which is very high, at 11.2% - she is now not checking sugars at home >> strongly advised to start  - discussed about starting to take Amaryl correctly, before meals, and also to add back Trulicity.  - At next visit, we may need to add back Humalog, but she would like to avoid this, if possible >> discussed about the neded to improve diet - I advised her to:  Patient Instructions  Please continue: - Metformin 750 mg x3 at dinnertime - Lantus 45 units at bedtime  Please move: - Amaryl 4 mg 2x a day - 20-30 min before meals  Add back: - Trulicity 1.5 mg weekly  Please return in 1.5 months with your sugar log.   - continue checking sugars at different times of the day - check 1-2x a day, rotating checks - advised  for yearly eye exams >> she is not UTD - Return to clinic in 1.5 mo with sugar log   2. Obesity class 3  - she lost 20 pounds in the last year >> likely 2/2 glucotoxicity - Trulicity will help her lose more  Philemon Kingdom, MD PhD [facility]

## 2017-12-22 NOTE — Patient Instructions (Signed)
Please continue: - Metformin 750 mg x3 at dinnertime - Lantus 45 units at bedtime  Please move: - Amaryl 4 mg 2x a day - 20-30 min before meals  Add back: - Trulicity 1.5 mg weekly  Try to check sugars 1-2x a day.  Please return in 1.5 months with your sugar log.

## 2017-12-28 ENCOUNTER — Ambulatory Visit: Payer: Medicare Other | Admitting: Podiatry

## 2018-01-30 ENCOUNTER — Encounter: Payer: Self-pay | Admitting: Internal Medicine

## 2018-01-30 ENCOUNTER — Encounter: Payer: Self-pay | Admitting: Family Medicine

## 2018-01-30 DIAGNOSIS — E1165 Type 2 diabetes mellitus with hyperglycemia: Principal | ICD-10-CM

## 2018-01-30 DIAGNOSIS — IMO0002 Reserved for concepts with insufficient information to code with codable children: Secondary | ICD-10-CM

## 2018-01-30 DIAGNOSIS — E785 Hyperlipidemia, unspecified: Secondary | ICD-10-CM

## 2018-01-30 DIAGNOSIS — E1151 Type 2 diabetes mellitus with diabetic peripheral angiopathy without gangrene: Secondary | ICD-10-CM

## 2018-01-30 MED ORDER — EZETIMIBE-SIMVASTATIN 10-40 MG PO TABS: 1.0000 | ORAL_TABLET | Freq: Every day | ORAL | 1 refills | Status: DC

## 2018-01-31 ENCOUNTER — Other Ambulatory Visit: Payer: Self-pay

## 2018-01-31 ENCOUNTER — Other Ambulatory Visit: Payer: Self-pay | Admitting: Family Medicine

## 2018-01-31 ENCOUNTER — Other Ambulatory Visit: Payer: Self-pay | Admitting: Internal Medicine

## 2018-01-31 DIAGNOSIS — E1151 Type 2 diabetes mellitus with diabetic peripheral angiopathy without gangrene: Secondary | ICD-10-CM

## 2018-01-31 DIAGNOSIS — E1165 Type 2 diabetes mellitus with hyperglycemia: Principal | ICD-10-CM

## 2018-01-31 DIAGNOSIS — IMO0002 Reserved for concepts with insufficient information to code with codable children: Secondary | ICD-10-CM

## 2018-01-31 MED ORDER — GLUCOSE BLOOD VI STRP: ORAL_STRIP | 3 refills | Status: DC

## 2018-01-31 MED ORDER — GLIMEPIRIDE 4 MG PO TABS: 4.0000 mg | ORAL_TABLET | Freq: Two times a day (BID) | ORAL | 3 refills | Status: DC

## 2018-02-07 ENCOUNTER — Other Ambulatory Visit: Payer: Self-pay | Admitting: Family Medicine

## 2018-02-08 ENCOUNTER — Other Ambulatory Visit: Payer: Self-pay | Admitting: Family Medicine

## 2018-02-08 LAB — HM DIABETES EYE EXAM

## 2018-02-14 ENCOUNTER — Encounter: Payer: Self-pay | Admitting: Internal Medicine

## 2018-02-14 ENCOUNTER — Ambulatory Visit (INDEPENDENT_AMBULATORY_CARE_PROVIDER_SITE_OTHER): Payer: Medicare Other | Admitting: Internal Medicine

## 2018-02-14 VITALS — BP 128/72 | HR 84 | Ht 63.0 in | Wt 257.4 lb

## 2018-02-14 DIAGNOSIS — E1165 Type 2 diabetes mellitus with hyperglycemia: Secondary | ICD-10-CM | POA: Diagnosis not present

## 2018-02-14 DIAGNOSIS — Z794 Long term (current) use of insulin: Secondary | ICD-10-CM | POA: Diagnosis not present

## 2018-02-14 DIAGNOSIS — E785 Hyperlipidemia, unspecified: Secondary | ICD-10-CM | POA: Diagnosis not present

## 2018-02-14 MED ORDER — INSULIN LISPRO 100 UNIT/ML ~~LOC~~ SOLN: 80.0000 [IU] | Freq: Three times a day (TID) | SUBCUTANEOUS | 11 refills | Status: DC

## 2018-02-14 MED ORDER — V-GO 40 KIT: PACK | 11 refills | Status: DC

## 2018-02-14 NOTE — Patient Instructions (Addendum)
Please continue: - Metformin 750 mg x3 at dinnertime - Trulicity 1.5 mg weekly  Stop: - Amaryl - Lantus  Start: - VGO 40 with:  2 clicks with a snack if >15 g carbs 4 clicks before a regular meal 5 clicks before a larger meal  If the VGo is covered, please call and schedule an appt with Andrea Marks.  ___________________________________________________________________  If you cannot get the VGo, continue: - Metformin 750 mg x3 at dinnertime - Amaryl 4 mg twice a day before meals  - Trulicity 1.5 mg weekly - Lantus 45 units at bedtime  Add: - Humalog: 8-10 units before a meal  Please return in 3 months with your sugar log.

## 2018-02-14 NOTE — Progress Notes (Signed)
Patient ID: Andrea Marks, female   DOB: 1951/01/13, 67 y.o.   MRN: [id number]  HPI: Andrea Marks is a 67 y.o.-year-old female, returning for f/u for DM2, dx 1994, insulin-dependent, uncontrolled, without long-term complications. Last visit was 2 months ago.  Last hemoglobin A1c was: Lab Results  Component Value Date   HGBA1C 11.2 (H) 12/19/2017   HGBA1C 11.3 (H) 04/21/2017   HGBA1C 7.8 (H) 06/11/2016   Pt is on: - Metformin 750 mg x3 at dinnertime - Amaryl 4 mg twice a day before meals  - Trulicity 1.5 mg weekly - restarted 12/2017 - Lantus 45 units at bedtime Tried Bydureon once a week >> she had problems injecting the medication.  She also tried Victoza. She was on Humalog in the past.  At last visit, she was not checking sugars.  Since then, she started to check 1-3 a day - still high: - am: 126-219 - 2h after b'fast: 161, 287 - lunch: 181-312 - 2h after lunch: 269-365 - dinner: 203-382 - 2h after dinner: n/c - bedtime: 239-329  No CKD, last BUN/creatinine was:  Lab Results  Component Value Date   BUN 19 12/19/2017   CREATININE 0.91 12/19/2017  On benazepril. + HL; Last set of lipids: Lab Results  Component Value Date   CHOL 115 12/19/2017   HDL 48.40 12/19/2017   LDLCALC 33 12/19/2017   LDLDIRECT 64.0 04/21/2017   TRIG 164.0 (H) 12/19/2017   CHOLHDL 2 12/19/2017  On Vytorin and fenofibrate. - Last eye exam: 01/2018: No DR, + cataracts - Denies numbness and tingling in her legs. She sees a Art therapist - Dr. Prudence Marks.  She had an ulcer in lower right leg (has lymphedema)  She also has a history of  HTN, GERD, depression, h/o thrombophlebitis.  ROS: Constitutional: no weight gain/no weight loss, + fatigue, no subjective hyperthermia, no subjective hypothermia Eyes: no blurry vision, no xerophthalmia ENT: no sore throat, no nodules palpated in throat, no dysphagia, no odynophagia, no hoarseness Cardiovascular: no CP/no SOB/no palpitations/no leg  swelling Respiratory: no cough/no SOB/no wheezing Gastrointestinal: no N/no V/no D/no C/no acid reflux Musculoskeletal: no muscle aches/+ joint aches Skin: no rashes, no hair loss Neurological: no tremors/no numbness/no tingling/no dizziness, + HA  I reviewed pt's medications, allergies, PMH, social hx, family hx, and changes were documented in the history of present illness. Otherwise, unchanged from my initial visit note.  Past Medical History:  Diagnosis Date  . Depression   . Diabetes mellitus   . GERD (gastroesophageal reflux disease)   . Hyperlipidemia   . Hypertension   . Obesity    Past Surgical History:  Procedure Laterality Date  . ABDOMINAL HYSTERECTOMY  1991   BSO   Social History   Socioeconomic History  . Marital status: Married    Spouse name: Not on file  . Number of children: 0  . Years of education: 107  . Highest education level: Not on file  Occupational History  . Occupation: retired from Surveyor, minerals: RETIRED  Social Needs  . Financial resource strain: Not on file  . Food insecurity:    Worry: Not on file    Inability: Not on file  . Transportation needs:    Medical: Not on file    Non-medical: Not on file  Tobacco Use  . Smoking status: Former Research scientist (life sciences)  . Smokeless tobacco: Never Used  Substance and Sexual Activity  . Alcohol use: No  . Drug use: No  . Sexual activity: Yes  Partners: Male  Lifestyle  . Physical activity:    Days per week: Not on file    Minutes per session: Not on file  . Stress: Not on file  Relationships  . Social connections:    Talks on phone: Not on file    Gets together: Not on file    Attends religious service: Not on file    Active member of club or organization: Not on file    Attends meetings of clubs or organizations: Not on file    Relationship status: Not on file  . Intimate partner violence:    Fear of current or ex partner: Not on file    Emotionally abused: Not on file    Physically  abused: Not on file    Forced sexual activity: Not on file  Other Topics Concern  . Not on file  Social History Narrative   No reg exercise   Current Outpatient Medications on File Prior to Visit  Medication Sig Dispense Refill  . acetaminophen (TYLENOL ARTHRITIS PAIN) 650 MG CR tablet Take 650 mg by mouth 2 (two) times daily as needed. For pain    . aspirin 81 MG tablet Take 81 mg by mouth daily.      . benazepril (LOTENSIN) 20 MG tablet Take 1 tablet (20 mg total) by mouth daily. 90 tablet 1  . Blood Glucose Monitoring Suppl (ONE TOUCH ULTRA SYSTEM KIT) W/DEVICE KIT 1 kit by Does not apply route once. 1 each 0  . Dulaglutide (TRULICITY) 1.5 ZO/1.0RU SOPN Inject 1.5 mg under skin weekly 12 pen 3  . estradiol (ESTRACE) 1 MG tablet Take 1 tablet (1 mg total) by mouth daily. 90 tablet 1  . ezetimibe-simvastatin (VYTORIN) 10-40 MG tablet Take 1 tablet by mouth at bedtime. 90 tablet 1  . fenofibrate 160 MG tablet Take 1 tablet (160 mg total) by mouth daily. 90 tablet 1  . FLUoxetine (PROZAC) 20 MG capsule TAKE 3 CAPSULES DAILY 270 capsule 1  . glimepiride (AMARYL) 4 MG tablet Take 1 tablet (4 mg total) by mouth 2 (two) times daily. 180 tablet 3  . glimepiride (AMARYL) 4 MG tablet TAKE 1 TABLET TWICE A DAY (NEED FURTHER EVALUATION AND/OR LAB TESTING BEFORE FURTHER REFILLS ARE GIVEN, MAKE AN APPOINTMENT) 180 tablet 0  . glucose blood (ONE TOUCH ULTRA TEST) test strip USE TO TEST BLOOD SUGAR 2 TIMES DAILY AS INSTRUCTED. DX: E11.65 200 each 3  . Insulin Glargine (LANTUS SOLOSTAR) 100 UNIT/ML Solostar Pen 45 units subcue daily at Bedtime 90 mL 1  . Insulin Pen Needle (NOVOFINE) 32G X 6 MM MISC To use w/ Lantus 100 each 3  . metFORMIN (GLUCOPHAGE-XR) 750 MG 24 hr tablet TAKE 1 TABLET THREE TIMES A DAY 270 tablet 1  . ONETOUCH DELICA LANCETS FINE MISC USE AS INSTRUCTED 1 each 9  . ranitidine (ZANTAC) 300 MG capsule TAKE 1 CAPSULE DAILY (SCHEDULE APPOINTMENT) 90 capsule 0   Current  Facility-Administered Medications on File Prior to Visit  Medication Dose Route Frequency Provider Last Rate Last Dose  . ipratropium-albuterol (DUONEB) 0.5-2.5 (3) MG/3ML nebulizer solution 3 mL  3 mL Nebulization Q6H Marks, Andrea C, MD   3 mL at 11/15/16 1330   Allergies  Allergen Reactions  . Hydrocodone-Acetaminophen Other (See Comments)    unknown  . Oxycodone Hcl Other (See Comments)    unknown  . Penicillins Other (See Comments)    unknown  . Prednisone    Family History  Problem Relation Age of  Onset  . Macular degeneration Mother   . Heart disease Mother        syncope  . Aortic stenosis Mother   . Lung disease Father 37       mesothelioma  . Cancer Maternal Aunt        stomach  . Heart disease Paternal Uncle        cabg  . Diabetes Paternal Uncle   . Heart disease Paternal Uncle   . Diabetes Paternal Uncle   . Diabetes Paternal Uncle   . Heart disease Paternal Uncle   . Heart disease Paternal Uncle   . Heart disease Paternal Uncle   . Lung cancer Unknown        asbestos  . Diabetes Paternal Grandmother   . Cancer Other        lung   PE: BP 128/72   Pulse 84   Ht '5\' 3"'$  (1.6 m)   Wt 257 lb 6.4 oz (116.8 kg)   SpO2 96%   BMI 45.60 kg/m  Wt Readings from Last 3 Encounters:  02/14/18 257 lb 6.4 oz (116.8 kg)  12/22/17 255 lb 2 oz (115.7 kg)  12/19/17 254 lb 9.6 oz (115.5 kg)   Constitutional: overweight, in NAD Eyes: PERRLA, EOMI, no exophthalmos ENT: moist mucous membranes, no thyromegaly, no cervical lymphadenopathy Cardiovascular: RRR, No MRG Respiratory: CTA B Gastrointestinal: abdomen soft, NT, ND, BS+ Musculoskeletal: no deformities, strength intact in all 4 Skin: moist, warm, no rashes Neurological: no tremor with outstretched hands, DTR normal in all 4  ASSESSMENT: 1. DM2, insulin-dependent, uncontrolled, without complications  2. Obesity class 3  3. HL  PLAN:  1. Patient with long-standing, uncontrolled, diabetes, on basal  insulin regimen, oral medication and back on GLP-1 receptor agonist, since last visit.  At last visit, she was returning after 2-year absence and her HbA1c was very high, at 11.2%.  In the past, she was noncompliant with her visits and forgetting medication doses.  At last visit, she was not checking sugars at home, either.  Since last visit, however, she started to check her sugars and take her medicines as prescribed.  - However, the sugars did not improve as much as expected still especially the day progresses. - Unfortunately, she needs mealtime insulin and we discussed about either using rapid insulin pens or, preferable, a VGo system.  I showed her the VGo pump and explained how it functions.  We will try to send her to the pharmacy to see if this is covered by her insurance.  If it is, will stop Lantus and Amaryl and will have her come back for a visit with the diabetes educator for [place] training - If this is not covered, we will need to start rapid insulin by pen - I advised her to:  Patient Instructions  Please continue: - Metformin 750 mg x3 at dinnertime - Trulicity 1.5 mg weekly  Stop: - Amaryl - Lantus  Start: - VGO 40 with:  2 clicks with a snack if >72 g carbs 4 clicks before a regular meal 5 clicks before a larger meal  If the VGo is covered, please call and schedule an appt with Leonia Reader.  ___________________________________________________________________  If you cannot get the VGo, continue: - Metformin 750 mg x3 at dinnertime - Amaryl 4 mg twice a day before meals  - Trulicity 1.5 mg weekly - Lantus 45 units at bedtime  Add: - Humalog: 8-10 units before a meal  Please return in 3 months  with your sugar log.   - continue checking sugars at different times of the day - check 3x a day, rotating checks - advised for yearly eye exams >> she is UTD - Return to clinic in 3 mo with sugar log   2. Obesity class 3  -Weight stable since last visit -However, I  think Trulicity will help her with both diabetes and weight loss in the near future.  3. HL - Reviewed latest lipid panel from 12/2017: LDL at goal, triglycerides high - Continues Vytorin and fenofibrate without side effects.  Philemon Kingdom, MD PhD [facility]

## 2018-02-15 ENCOUNTER — Other Ambulatory Visit: Payer: Self-pay | Admitting: Internal Medicine

## 2018-02-15 ENCOUNTER — Telehealth: Payer: Self-pay | Admitting: Emergency Medicine

## 2018-02-15 DIAGNOSIS — E1165 Type 2 diabetes mellitus with hyperglycemia: Secondary | ICD-10-CM

## 2018-02-15 DIAGNOSIS — Z794 Long term (current) use of insulin: Secondary | ICD-10-CM

## 2018-02-15 NOTE — Telephone Encounter (Signed)
Wonderful! I will put the referral in for her to see Wops Incinda.

## 2018-02-15 NOTE — Telephone Encounter (Signed)
Pt called and has some questions about her insulin. Can you please give her a call back. Thanks.

## 2018-02-15 NOTE — Telephone Encounter (Signed)
Spoke to patient.  Pt said she got her insulin questions answered. She was approved for VGO and wanted to let Dr. Elvera LennoxGherghe know. Requested to schedule f/u appt with Dr. Elvera LennoxGherghe and VGO appt w/ Bonita QuinLinda. Transferred pt to front office.

## 2018-02-20 ENCOUNTER — Encounter: Payer: Medicare Other | Attending: Internal Medicine | Admitting: Nutrition

## 2018-02-20 DIAGNOSIS — E1165 Type 2 diabetes mellitus with hyperglycemia: Secondary | ICD-10-CM | POA: Diagnosis present

## 2018-02-20 DIAGNOSIS — Z794 Long term (current) use of insulin: Secondary | ICD-10-CM

## 2018-02-20 DIAGNOSIS — Z713 Dietary counseling and surveillance: Secondary | ICD-10-CM | POA: Diagnosis present

## 2018-02-21 ENCOUNTER — Encounter: Payer: Self-pay | Admitting: Internal Medicine

## 2018-02-21 NOTE — Progress Notes (Signed)
Pt. Was trained on how to fill, apply and use the V-go.   We discussed how to give the boluses, and she had the instruction sheet from Dr. Greggory StallionGeorge for bolus amounts for larger and smaller meals.  She re demonstrated how to give the boluses, and we discussed what would be considered a smaller meals (less carbs), and was given a list of all carbs, and carb portions sizes.  She will give the larger meal dose, when meals contain more that 45 grams or 3 servings. She reported good understanding of this. She will give 1 extra button press when pre meal boluses are over 200.   She is aware that each button press represents 2 of insulin given.     She filled a 40 V-go with Humalog insulin, and applied it to her upper left abdomen without any difficulty.  She inserted the needle and start this V-Go at 11:15 AM.  She did not take her long-acting insulin last night as directed, and was reminded that she is to stop this.  She reported good understanding of this. She was given a log book sheet to record pre meal blood sugars and bolus amounts.  We reviewed the need to check the next meal blood sugar, to determine if she gave the correct pre meal bolus amount, and she reported good understanding of this.   She was given the toll free telephone number to call if questions.  She was encouraged to call if blood sugars are dropping below 80, or remain over 200.  She agreed to do this, and had no final questions.

## 2018-02-21 NOTE — Patient Instructions (Addendum)
Stop lantus insulin Test blood sugars before meals and at bedtime Write bolus amounts before meals Call if blood sugars drop below 80, or remain above 200 Call 800 telephone number for questions about your V-go

## 2018-02-23 ENCOUNTER — Telehealth: Payer: Self-pay | Admitting: Internal Medicine

## 2018-02-23 NOTE — Telephone Encounter (Signed)
Patient sent message on MyChart yesterday and has heard nothing back. Patient has been waiting to pick RX for Humalog Vials (the medication that goes into the [place]) that Pilar JarvisLinda S. showed Patient at training on Monday 02/20/18 for [place]. [facility]--[facility] faxed something to our office but have heard nothing back from us. This has been going on since Monday 02/20/18). [facility] [address]

## 2018-02-24 ENCOUNTER — Other Ambulatory Visit: Payer: Self-pay

## 2018-02-24 ENCOUNTER — Telehealth: Payer: Self-pay | Admitting: Internal Medicine

## 2018-02-24 ENCOUNTER — Encounter: Payer: Self-pay | Admitting: Family Medicine

## 2018-02-24 MED ORDER — INSULIN LISPRO 100 UNIT/ML ~~LOC~~ SOLN: SUBCUTANEOUS | 11 refills | Status: DC

## 2018-02-24 MED ORDER — INSULIN ASPART 100 UNIT/ML ~~LOC~~ SOLN: 80.0000 [IU] | Freq: Every day | SUBCUTANEOUS | 11 refills | Status: DC

## 2018-02-24 MED ORDER — INSULIN ASPART 100 UNIT/ML ~~LOC~~ SOLN: 80.0000 [IU] | Freq: Three times a day (TID) | SUBCUTANEOUS | 11 refills | Status: DC

## 2018-02-24 NOTE — Telephone Encounter (Signed)
[facility] [address] [phone] pharmacy calling they did receive the rx for the humalog with the dx code however humalog is now being replaced via insurance with the novolog and a new rx for that with dx code needs to be submitted please

## 2018-02-24 NOTE — Telephone Encounter (Signed)
OK to call in Novolog instead.

## 2018-02-24 NOTE — Telephone Encounter (Signed)
Pt would like a call back once Rx has been fixed. Please and Thank you!  Call pt @ [phone].

## 2018-02-24 NOTE — Addendum Note (Signed)
Addended by: Carlus PavlovGHERGHE, Lakoda Raske on: 02/24/2018 04:58 PM   Modules accepted: Orders

## 2018-02-24 NOTE — Telephone Encounter (Signed)
Change sent to pharmacy.  

## 2018-02-24 NOTE — Telephone Encounter (Signed)
Please change rx to novolog

## 2018-02-27 ENCOUNTER — Encounter: Payer: Self-pay | Admitting: Internal Medicine

## 2018-02-27 ENCOUNTER — Other Ambulatory Visit: Payer: Self-pay

## 2018-02-27 MED ORDER — INSULIN ASPART 100 UNIT/ML ~~LOC~~ SOLN: SUBCUTANEOUS | 11 refills | Status: DC

## 2018-02-27 NOTE — Telephone Encounter (Signed)
Spoke to pharmacist. Humalog is covered under Part B medical plan but requires auth. Called insurance to get [place], [id number] pending review.  Informed patient.

## 2018-02-27 NOTE — Telephone Encounter (Signed)
Replied on original telephone message.  Closing this encounter.

## 2018-02-27 NOTE — Telephone Encounter (Signed)
This encounter was created in error - please disregard.

## 2018-02-27 NOTE — Telephone Encounter (Signed)
Spoke to patient. Advised samples available until PA reviewed per Dr. Reece Agar. Pt was very grateful.

## 2018-02-28 ENCOUNTER — Other Ambulatory Visit: Payer: Self-pay

## 2018-02-28 ENCOUNTER — Other Ambulatory Visit: Payer: Self-pay | Admitting: Endocrinology

## 2018-02-28 ENCOUNTER — Ambulatory Visit: Payer: Medicare Other

## 2018-02-28 MED ORDER — INSULIN ASPART 100 UNIT/ML ~~LOC~~ SOLN: SUBCUTANEOUS | 11 refills | Status: DC

## 2018-02-28 NOTE — Telephone Encounter (Signed)
Called [phone] to check status of PA. Still in review. Review period 1-4 days.

## 2018-03-03 ENCOUNTER — Telehealth: Payer: Self-pay | Admitting: Internal Medicine

## 2018-03-03 NOTE — Telephone Encounter (Signed)
Called pt. No answer.  Called to advise of last PA status per previous telephone note.

## 2018-03-03 NOTE — Telephone Encounter (Signed)
Faxed request for addt'l info form to [facility] @ [phone].

## 2018-03-03 NOTE — Telephone Encounter (Signed)
Calling on the status of PA for insulin Humalog [phone] Ref # [id number]  Claim will be denied  May 13, 19 at 6:29 am central time, if they do not hear from [place].

## 2018-03-04 ENCOUNTER — Encounter: Payer: Self-pay | Admitting: Family Medicine

## 2018-03-04 DIAGNOSIS — I10 Essential (primary) hypertension: Secondary | ICD-10-CM

## 2018-03-04 DIAGNOSIS — E785 Hyperlipidemia, unspecified: Secondary | ICD-10-CM

## 2018-03-04 DIAGNOSIS — Z780 Asymptomatic menopausal state: Secondary | ICD-10-CM

## 2018-03-05 ENCOUNTER — Encounter: Payer: Self-pay | Admitting: Internal Medicine

## 2018-03-06 NOTE — Telephone Encounter (Addendum)
Humalog 100 unit PA approval. [id number] Approval date(s): 10/31/2018 Patient aware p/u med on Sat. Reports b/s still running high.  Patient sent MyChart message w/ b/s readings. Will fwd to Dr. Elvera Lennox.

## 2018-03-07 ENCOUNTER — Other Ambulatory Visit: Payer: Self-pay | Admitting: Internal Medicine

## 2018-03-07 DIAGNOSIS — E1165 Type 2 diabetes mellitus with hyperglycemia: Principal | ICD-10-CM

## 2018-03-07 DIAGNOSIS — E1151 Type 2 diabetes mellitus with diabetic peripheral angiopathy without gangrene: Secondary | ICD-10-CM

## 2018-03-07 DIAGNOSIS — IMO0002 Reserved for concepts with insufficient information to code with codable children: Secondary | ICD-10-CM

## 2018-03-07 MED ORDER — FENOFIBRATE 160 MG PO TABS: 160.0000 mg | ORAL_TABLET | Freq: Every day | ORAL | 1 refills | Status: DC

## 2018-03-07 MED ORDER — GLIMEPIRIDE 4 MG PO TABS: 4.0000 mg | ORAL_TABLET | Freq: Two times a day (BID) | ORAL | 3 refills | Status: DC

## 2018-03-07 MED ORDER — BENAZEPRIL HCL 20 MG PO TABS: 20.0000 mg | ORAL_TABLET | Freq: Every day | ORAL | 1 refills | Status: DC

## 2018-03-07 MED ORDER — ESTRADIOL 1 MG PO TABS: 1.0000 mg | ORAL_TABLET | Freq: Every day | ORAL | 1 refills | Status: DC

## 2018-03-08 ENCOUNTER — Encounter: Payer: Self-pay | Admitting: Internal Medicine

## 2018-03-10 NOTE — Telephone Encounter (Signed)
I think she can maybe stay off until then. If she does not want to do that, can call in few tabs to local pharmacy.

## 2018-03-13 ENCOUNTER — Other Ambulatory Visit: Payer: Self-pay

## 2018-03-13 ENCOUNTER — Telehealth: Payer: Self-pay | Admitting: Family Medicine

## 2018-03-13 DIAGNOSIS — E1165 Type 2 diabetes mellitus with hyperglycemia: Principal | ICD-10-CM

## 2018-03-13 DIAGNOSIS — E1151 Type 2 diabetes mellitus with diabetic peripheral angiopathy without gangrene: Secondary | ICD-10-CM

## 2018-03-13 DIAGNOSIS — IMO0002 Reserved for concepts with insufficient information to code with codable children: Secondary | ICD-10-CM

## 2018-03-13 MED ORDER — GLIMEPIRIDE 4 MG PO TABS: 4.0000 mg | ORAL_TABLET | Freq: Two times a day (BID) | ORAL | 3 refills | Status: DC

## 2018-03-13 NOTE — Telephone Encounter (Signed)
Copied from CRM [id number]. Topic: Quick Communication - See Telephone Encounter >> Mar 13, 2018  2:17 PM Louie Bun, Rosey Bath D wrote: CRM for notification. See Telephone encounter for: 03/13/18. Patient called and said that [facility] is suppose to send a RX refill for Insulin Glargine (LANTUS SOLOSTAR) 100 UNIT/ML Solostar Pen but she will no longer need it and it was sent accidentally by her and pharmacy so please disregard it.

## 2018-03-22 ENCOUNTER — Ambulatory Visit: Payer: Medicare Other | Admitting: Podiatry

## 2018-03-31 ENCOUNTER — Other Ambulatory Visit: Payer: Self-pay | Admitting: Family Medicine

## 2018-03-31 DIAGNOSIS — E1165 Type 2 diabetes mellitus with hyperglycemia: Principal | ICD-10-CM

## 2018-03-31 DIAGNOSIS — E1151 Type 2 diabetes mellitus with diabetic peripheral angiopathy without gangrene: Secondary | ICD-10-CM

## 2018-03-31 DIAGNOSIS — IMO0002 Reserved for concepts with insufficient information to code with codable children: Secondary | ICD-10-CM

## 2018-04-01 ENCOUNTER — Encounter: Payer: Self-pay | Admitting: Internal Medicine

## 2018-04-09 ENCOUNTER — Encounter: Payer: Self-pay | Admitting: Internal Medicine

## 2018-04-10 ENCOUNTER — Telehealth: Payer: Self-pay

## 2018-04-10 ENCOUNTER — Encounter: Payer: Self-pay | Admitting: Internal Medicine

## 2018-04-10 NOTE — Telephone Encounter (Signed)
-----   Message from Carlus Pavlovristina Gherghe, MD sent at 04/07/2018 12:25 PM EDT ----- Bonita QuinLinda, Please see the previous message from the patient.  She gave you the Lantus and NovoLog unused pens when she switched to the Vgo, but it appears that the pump is not enough for her so we have to go to higher doses of NovoLog with meals so, unfortunately, we need to go back to Lantus and NovoLog.  Do you still have her pens?  Rosalynd Mcwright, if not, can we give her few pens? TY! c

## 2018-04-10 NOTE — Telephone Encounter (Signed)
I have her some samples sent MyChart message informing pt.

## 2018-04-11 ENCOUNTER — Telehealth: Payer: Self-pay | Admitting: Internal Medicine

## 2018-04-11 ENCOUNTER — Other Ambulatory Visit: Payer: Self-pay | Admitting: Internal Medicine

## 2018-04-11 DIAGNOSIS — IMO0002 Reserved for concepts with insufficient information to code with codable children: Secondary | ICD-10-CM

## 2018-04-11 DIAGNOSIS — E1165 Type 2 diabetes mellitus with hyperglycemia: Principal | ICD-10-CM

## 2018-04-11 DIAGNOSIS — E1151 Type 2 diabetes mellitus with diabetic peripheral angiopathy without gangrene: Secondary | ICD-10-CM

## 2018-04-11 MED ORDER — INSULIN LISPRO 100 UNIT/ML ~~LOC~~ SOLN: SUBCUTANEOUS | 1 refills | Status: DC

## 2018-04-11 MED ORDER — GLIMEPIRIDE 4 MG PO TABS: 4.0000 mg | ORAL_TABLET | Freq: Two times a day (BID) | ORAL | 3 refills | Status: DC

## 2018-04-11 MED ORDER — V-GO 40 KIT: PACK | 1 refills | Status: DC

## 2018-04-11 MED ORDER — DULAGLUTIDE 1.5 MG/0.5ML ~~LOC~~ SOAJ: SUBCUTANEOUS | 3 refills | Status: DC

## 2018-04-11 NOTE — Telephone Encounter (Signed)
Sent those two medications to pharmacy, also still have samples for the pt in the back fridge if needed

## 2018-04-11 NOTE — Telephone Encounter (Signed)
Courtney, we can refill the glimepiride and the Trulicity.  Estrace is not prescribed by me.  After discussion with patient today, we will continue the VGo for another month.  Therefore, earlier today, I sent a prescription for just 1 month of the pump with the associated Humalog insulin vials to her local pharmacy.

## 2018-04-11 NOTE — Telephone Encounter (Signed)
Pt is no longer using the V-GO correct? Please advise on below

## 2018-04-11 NOTE — Telephone Encounter (Signed)
Patient need a Escript for a 90 day refill for these medication glimepiride (AMARYL) 4 MG tablet [[id number]]  estradiol (ESTRACE) 1 MG tablet [[id number]] Dulaglutide (TRULICITY) 1.5 MK/3.4JZ SOPN [[id number]]  Insulin Disposable Pump (V-GO 40) KIT [[id number]]   Pharmacy:  [facility], [place]

## 2018-04-12 ENCOUNTER — Ambulatory Visit: Payer: Medicare Other | Admitting: Podiatry

## 2018-04-14 ENCOUNTER — Telehealth: Payer: Self-pay

## 2018-04-14 ENCOUNTER — Telehealth: Payer: Self-pay | Admitting: Internal Medicine

## 2018-04-14 ENCOUNTER — Ambulatory Visit: Payer: Medicare Other | Admitting: Podiatry

## 2018-04-14 NOTE — Telephone Encounter (Signed)
Per my msg to her: .Marland Kitchen.Marland Kitchen.One option is to continue with the Vgo for now and add a low-dose Lantus at night, for example 10 units. We can start with that and see how this goes, if not helping, we may need to switch to mealtime injections by pen.Marland Kitchen..Marland Kitchen

## 2018-04-14 NOTE — Telephone Encounter (Signed)
Pt is confused on what she is taking and her doses since she is going to continue the VGo for now. Please advise

## 2018-04-14 NOTE — Telephone Encounter (Signed)
Pt is aware.  

## 2018-04-14 NOTE — Telephone Encounter (Signed)
Patient stated that she need clarification on insulin vial, she did not know the name of the insulin. She stated she need it need it today.  [id number] [place] [place] - [address] [facility] [address] [phone] (Phone) [phone] (Fax)

## 2018-04-17 ENCOUNTER — Encounter: Payer: Self-pay | Admitting: Internal Medicine

## 2018-04-17 ENCOUNTER — Other Ambulatory Visit: Payer: Self-pay

## 2018-04-17 ENCOUNTER — Other Ambulatory Visit: Payer: Self-pay | Admitting: *Deleted

## 2018-04-17 DIAGNOSIS — Z780 Asymptomatic menopausal state: Secondary | ICD-10-CM

## 2018-04-17 MED ORDER — ESTRADIOL 1 MG PO TABS: 1.0000 mg | ORAL_TABLET | Freq: Every day | ORAL | 1 refills | Status: DC

## 2018-04-17 MED ORDER — INSULIN LISPRO 100 UNIT/ML ~~LOC~~ SOLN: SUBCUTANEOUS | 1 refills | Status: DC

## 2018-04-17 NOTE — Telephone Encounter (Signed)
[facility] can not [facility] at this time it is a known corporate issue with not resolve time

## 2018-04-28 ENCOUNTER — Ambulatory Visit: Payer: Medicare Other | Admitting: Podiatry

## 2018-05-03 ENCOUNTER — Encounter: Payer: Self-pay | Admitting: Podiatry

## 2018-05-03 ENCOUNTER — Ambulatory Visit (INDEPENDENT_AMBULATORY_CARE_PROVIDER_SITE_OTHER): Payer: Medicare Other | Admitting: Podiatry

## 2018-05-03 DIAGNOSIS — E1151 Type 2 diabetes mellitus with diabetic peripheral angiopathy without gangrene: Secondary | ICD-10-CM

## 2018-05-03 DIAGNOSIS — L84 Corns and callosities: Secondary | ICD-10-CM | POA: Diagnosis not present

## 2018-05-03 DIAGNOSIS — M79676 Pain in unspecified toe(s): Secondary | ICD-10-CM | POA: Diagnosis not present

## 2018-05-03 DIAGNOSIS — Z794 Long term (current) use of insulin: Secondary | ICD-10-CM

## 2018-05-03 DIAGNOSIS — B351 Tinea unguium: Secondary | ICD-10-CM

## 2018-05-03 NOTE — Progress Notes (Addendum)
Patient ID: Delsa SaleRoberta Wigle, female   DOB: 1951-04-08, 67 y.o.   MRN: [id number] Complaint:  Visit Type: Patient returns to my office for continued preventative foot care services. Complaint: Patient states" my nails have grown long and thick and become painful to walk and wear shoes".  Painful callus on bottom of both big toes. Patient has been diagnosed with DM with neuropathy.. The patient presents for preventative foot care services. No changes to ROS  Podiatric Exam: Vascular: dorsalis pedis and posterior tibial pulses are not palpable bilateral due to foot swelling.. Capillary return is immediate. Temperature gradient is WNL. Skin turgor WNL  Sensorium: Diminished Semmes Weinstein monofilament test. Normal tactile sensation bilaterally. Nail Exam: Pt has thick disfigured discolored nails with subungual debris noted bilateral entire nail hallux through fifth toenails Ulcer Exam: There is no evidence of ulcer or pre-ulcerative changes or infection. Orthopedic Exam: Muscle tone and strength are WNL. No limitations in general ROM. No crepitus or effusions noted. Foot type and digits show no abnormalities. Bony prominences are unremarkable. Skin: No Porokeratosis. No infection or ulcers.  Pinch callus hallux B/L.  Diagnosis:  Onychomycosis, , Pain in right toe, pain in left toes,  Callus B/L  Treatment & Plan Procedures and Treatment: Consent by patient was obtained for treatment procedures. The patient understood the discussion of treatment and procedures well. All questions were answered thoroughly reviewed. Debridement of mycotic and hypertrophic toenails, 1 through 5 bilateral and clearing of subungual debris. No ulceration, no infection noted. Healing left ankle injury. Return Visit-Office Procedure: Patient instructed to return to the office for a follow up visit 10 weeks  for continued evaluation and treatment.   Helane GuntherGregory Marcee Jacobs DPM

## 2018-05-11 ENCOUNTER — Other Ambulatory Visit: Payer: Self-pay | Admitting: Family Medicine

## 2018-05-18 ENCOUNTER — Ambulatory Visit (INDEPENDENT_AMBULATORY_CARE_PROVIDER_SITE_OTHER): Payer: Medicare Other | Admitting: Internal Medicine

## 2018-05-18 ENCOUNTER — Encounter: Payer: Self-pay | Admitting: Internal Medicine

## 2018-05-18 VITALS — BP 124/60 | HR 86 | Ht 63.0 in | Wt 279.8 lb

## 2018-05-18 DIAGNOSIS — E1165 Type 2 diabetes mellitus with hyperglycemia: Secondary | ICD-10-CM

## 2018-05-18 DIAGNOSIS — E785 Hyperlipidemia, unspecified: Secondary | ICD-10-CM

## 2018-05-18 DIAGNOSIS — E1151 Type 2 diabetes mellitus with diabetic peripheral angiopathy without gangrene: Secondary | ICD-10-CM

## 2018-05-18 DIAGNOSIS — Z794 Long term (current) use of insulin: Secondary | ICD-10-CM | POA: Diagnosis not present

## 2018-05-18 DIAGNOSIS — IMO0002 Reserved for concepts with insufficient information to code with codable children: Secondary | ICD-10-CM

## 2018-05-18 LAB — POCT GLYCOSYLATED HEMOGLOBIN (HGB A1C): Hemoglobin A1C: 7.5 % — AB (ref 4.0–5.6)

## 2018-05-18 MED ORDER — INSULIN LISPRO 200 UNIT/ML ~~LOC~~ SOPN: 15.0000 [IU] | PEN_INJECTOR | Freq: Three times a day (TID) | SUBCUTANEOUS | 5 refills | Status: DC

## 2018-05-18 MED ORDER — INSULIN GLARGINE 100 UNIT/ML SOLOSTAR PEN: PEN_INJECTOR | SUBCUTANEOUS | 5 refills | Status: DC

## 2018-05-18 MED ORDER — GLUCOSE BLOOD VI STRP: ORAL_STRIP | 3 refills | Status: DC

## 2018-05-18 NOTE — Progress Notes (Signed)
Patient ID: Andrea Marks, female   DOB: [birth year], 67 y.o.   MRN: [id number]  HPI: Andrea Marks is a 67 y.o.-year-old female, returning for f/u for DM2, dx 1994, insulin-dependent, uncontrolled, without long-term complications. Last visit was 3 mo ago.  As sugars were higher at last visit, we started VGo patch pump.  Last hemoglobin A1c was: Lab Results  Component Value Date   HGBA1C 11.2 (H) 12/19/2017   HGBA1C 11.3 (H) 04/21/2017   HGBA1C 7.8 (H) 06/11/2016   Pt is on: - Metformin 750 mg x3 at dinnertime - Trulicity 1.5 mg weekly - restarted 12/2017 - Amaryl 4 mg 2x a day before mails - VGo 40 - 6 clicks per meal  - Lantus 10 units at bedtime Tried Bydureon once a week >> she had problems injecting the medication.  She also tried Victoza. She was on Humalog in the past.  She checks sugars 1-2x a day: - am: 126-219 >> 145-271 - 2h after b'fast: 161, 287 >> 202-238, 346 - lunch: 181-312 >> 168-310 - 2h after lunch: 269-365 >> 289 - dinner: 203-382 >> 331 - 2h after dinner: n/c - bedtime: 239-329 >> n/c  Meals: - Breakfast: egg + bagel + yoghurt - Lunch: may skip, salad - Dinner: salad, hamburger/hot dog, tuna fish - Snacks: potato chips, pretzel  No CKD, last BUN/creatinine was:  Lab Results  Component Value Date   BUN 19 12/19/2017   CREATININE 0.91 12/19/2017  On Benazepril + HL; Last set of lipids: Lab Results  Component Value Date   CHOL 115 12/19/2017   HDL 48.40 12/19/2017   LDLCALC 33 12/19/2017   LDLDIRECT 64.0 04/21/2017   TRIG 164.0 (H) 12/19/2017   CHOLHDL 2 12/19/2017  On Vytorin and Fenofibrate. - Last eye exam: 01/2018 >> No DR, + cataracts - no  numbness and tingling in her legs. She sees a Art therapist - Dr. Prudence Davidson.  She had an ulcer in lower right leg (has lymphedema) >> healed.  She also has a history of  HTN, GERD, depression, h/o thrombophlebitis.  ROS: Constitutional: no weight gain/no weight loss, + fatigue, no subjective  hyperthermia, no subjective hypothermia Eyes: no blurry vision, no xerophthalmia ENT: no sore throat, no nodules palpated in throat, no dysphagia, no odynophagia, no hoarseness Cardiovascular: no CP/no SOB/no palpitations/no leg swelling Respiratory: no cough/no SOB/no wheezing Gastrointestinal: no N/no V/no D/no C/no acid reflux Musculoskeletal: + Muscle aches/+ joint aches -after a fall 1 month ago Skin: no rashes, no hair loss Neurological: no tremors/no numbness/no tingling/no dizziness  I reviewed pt's medications, allergies, PMH, social hx, family hx, and changes were documented in the history of present illness. Otherwise, unchanged from my initial visit note.  Past Medical History:  Diagnosis Date  . Depression   . Diabetes mellitus   . GERD (gastroesophageal reflux disease)   . Hyperlipidemia   . Hypertension   . Obesity    Past Surgical History:  Procedure Laterality Date  . ABDOMINAL HYSTERECTOMY  1991   BSO   Social History   Socioeconomic History  . Marital status: Married    Spouse name: Not on file  . Number of children: 0  . Years of education: 78  . Highest education level: Not on file  Occupational History  . Occupation: retired from Surveyor, minerals: RETIRED  Social Needs  . Financial resource strain: Not on file  . Food insecurity:    Worry: Not on file    Inability: Not on file  .  Transportation needs:    Medical: Not on file    Non-medical: Not on file  Tobacco Use  . Smoking status: Former Research scientist (life sciences)  . Smokeless tobacco: Never Used  Substance and Sexual Activity  . Alcohol use: No  . Drug use: No  . Sexual activity: Yes    Partners: Male  Lifestyle  . Physical activity:    Days per week: Not on file    Minutes per session: Not on file  . Stress: Not on file  Relationships  . Social connections:    Talks on phone: Not on file    Gets together: Not on file    Attends religious service: Not on file    Active member of club or  organization: Not on file    Attends meetings of clubs or organizations: Not on file    Relationship status: Not on file  . Intimate partner violence:    Fear of current or ex partner: Not on file    Emotionally abused: Not on file    Physically abused: Not on file    Forced sexual activity: Not on file  Other Topics Concern  . Not on file  Social History Narrative   No reg exercise   Current Outpatient Medications on File Prior to Visit  Medication Sig Dispense Refill  . acetaminophen (TYLENOL ARTHRITIS PAIN) 650 MG CR tablet Take 650 mg by mouth 2 (two) times daily as needed. For pain    . aspirin 81 MG tablet Take 81 mg by mouth daily.      . benazepril (LOTENSIN) 20 MG tablet Take 1 tablet (20 mg total) by mouth daily. 90 tablet 1  . Blood Glucose Monitoring Suppl (ONE TOUCH ULTRA SYSTEM KIT) W/DEVICE KIT 1 kit by Does not apply route once. 1 each 0  . Dulaglutide (TRULICITY) 1.5 MC/9.4BS SOPN Inject 1.5 mg under skin weekly 12 pen 3  . estradiol (ESTRACE) 1 MG tablet Take 1 tablet (1 mg total) by mouth daily. 90 tablet 1  . ezetimibe-simvastatin (VYTORIN) 10-40 MG tablet Take 1 tablet by mouth at bedtime. 90 tablet 1  . fenofibrate 160 MG tablet Take 1 tablet (160 mg total) by mouth daily. 90 tablet 1  . FLUoxetine (PROZAC) 20 MG capsule TAKE 3 CAPSULES DAILY 270 capsule 1  . glimepiride (AMARYL) 4 MG tablet Take 1 tablet (4 mg total) by mouth 2 (two) times daily. 180 tablet 3  . glucose blood (ONE TOUCH ULTRA TEST) test strip USE TO TEST BLOOD SUGAR 2 TIMES DAILY AS INSTRUCTED. DX: E11.65 200 each 3  . Insulin Disposable Pump (V-GO 40) KIT Use daily 30 kit 1  . Insulin Glargine (LANTUS SOLOSTAR) 100 UNIT/ML Solostar Pen 45 units subcue daily at Bedtime 90 mL 1  . insulin lispro (HUMALOG) 100 UNIT/ML injection Use 80 units daily in the VGo DX: E11.59 72 mL 1  . Insulin Pen Needle (NOVOFINE) 32G X 6 MM MISC To use w/ Lantus 100 each 3  . metFORMIN (GLUCOPHAGE-XR) 750 MG 24 hr tablet  TAKE 1 TABLET THREE TIMES A DAY 270 tablet 1  . ONETOUCH DELICA LANCETS FINE MISC USE AS INSTRUCTED 1 each 9  . ranitidine (ZANTAC) 300 MG capsule Take 1 capsule (300 mg total) by mouth daily. 90 capsule 0   Current Facility-Administered Medications on File Prior to Visit  Medication Dose Route Frequency Provider Last Rate Last Dose  . ipratropium-albuterol (DUONEB) 0.5-2.5 (3) MG/3ML nebulizer solution 3 mL  3 mL Nebulization Q6H Copland,  Gay Filler, MD   3 mL at 11/15/16 1330   Allergies  Allergen Reactions  . Hydrocodone-Acetaminophen Other (See Comments)    unknown  . Oxycodone Hcl Other (See Comments)    unknown  . Penicillins Other (See Comments)    unknown  . Prednisone    Family History  Problem Relation Age of Onset  . Macular degeneration Mother   . Heart disease Mother        syncope  . Aortic stenosis Mother   . Lung disease Father 26       mesothelioma  . Cancer Maternal Aunt        stomach  . Heart disease Paternal Uncle        cabg  . Diabetes Paternal Uncle   . Heart disease Paternal Uncle   . Diabetes Paternal Uncle   . Diabetes Paternal Uncle   . Heart disease Paternal Uncle   . Heart disease Paternal Uncle   . Heart disease Paternal Uncle   . Lung cancer Unknown        asbestos  . Diabetes Paternal Grandmother   . Cancer Other        lung   PE: BP 124/60   Pulse 86   Ht '5\' 3"'$  (1.6 m)   Wt 279 lb 12.8 oz (126.9 kg)   SpO2 94%   BMI 49.56 kg/m  Wt Readings from Last 3 Encounters:  05/18/18 279 lb 12.8 oz (126.9 kg)  02/14/18 257 lb 6.4 oz (116.8 kg)  12/22/17 255 lb 2 oz (115.7 kg)   Constitutional: overweight, in NAD Eyes: PERRLA, EOMI, no exophthalmos ENT: moist mucous membranes, no thyromegaly, no cervical lymphadenopathy Cardiovascular: RRR, No MRG Respiratory: CTA B Gastrointestinal: abdomen soft, NT, ND, BS+ Musculoskeletal: no deformities, strength intact in all 4 Skin: moist, warm, no rashes Neurological: no tremor with  outstretched hands, DTR normal in all 4  ASSESSMENT: 1. DM2, insulin-dependent, uncontrolled, without complications  2. Obesity class 3  3. HL  PLAN:  1. Patient with 2 diabetes, on oral medication (metformin, Amaryl), GLP1 receptor agonist (Trulicity), VGo pump and also now long-acting insulin added at bedtime since last visit.  At last visit, sugars were very high.  In the past, she was not compliant with her visits of her getting medication doses.  She was not checking her sugars at an HbA1c was 11.1% in 12/2017.  At last visit, we added mealtime insulin in the VGo40 pump.  She contacted Mrs. last visit as her sugars remained high, although improved.  We added Lantus at bedtime.  However, we discussed that if her sugars do not decrease, she will need to start using insulin pens and injecting higher amounts then allowed by the Vgo pump - At this visit, she tells me that she does not like the Vgo.  It precludes her from sleeping on her side at night.  She would like to stop it.  I agree with this, since it does not allow her to bolus more than 12 units per meal.   - Her sugars did improve since last visit, but she still has some high sugars, in the 200s anywhere 300s.  Some of these are due to her forgetting to bolus Humalog before the meal.  We discussed that this is absolutely necessary. - At this visit, we will stop the Vgo and switch to basal-bolus regimen.  We will increase the doses of Humalog that she gets per meal.  I also will switch her to  U200 Humalog.  We will continue metformin and Trulicity and stop glimepiride. - I advised her to:  Patient Instructions  Please continue: - Metformin 750 mg x3 at dinnertime - Trulicity 1.5 mg weekly - restarted 12/2017  Stop VGo.  Stop Glimepiride.  Increase: - Lantus to 55 units at bedtime - Humalog 3x a day before meals 15 units before a smaller meal 18 units before a larger meal  Please return in 3 months with your sugar log.   -  today, HbA1c is 7.5% (much improved) - continue checking sugars at different times of the day - check 3x a day, rotating checks - advised for yearly eye exams >> she is UTD - Return to clinic in 3 mo with sugar log    2. Obesity class 3  - gained weight since last visit - continue Trulicity which should also help with wt loss  3. HL - Reviewed latest lipid panel from 12/2017: LDL at goal, TG high Lab Results  Component Value Date   CHOL 115 12/19/2017   HDL 48.40 12/19/2017   LDLCALC 33 12/19/2017   LDLDIRECT 64.0 04/21/2017   TRIG 164.0 (H) 12/19/2017   CHOLHDL 2 12/19/2017  - Continues Vytorin and fenofibrate without side effects.  Philemon Kingdom, MD PhD [facility] Endocrinology

## 2018-05-18 NOTE — Patient Instructions (Addendum)
Please continue: - Metformin 750 mg x3 at dinnertime - Trulicity 1.5 mg weekly - restarted 12/2017  Stop VGo.  Stop Glimepiride.  Increase: - Lantus to 55 units at bedtime - Humalog 3x a day before meals 15 units before a smaller meal 18 units before a larger meal  Please return in 3 months with your sugar log.

## 2018-05-18 NOTE — Addendum Note (Signed)
Addended by: Yolande JollyLAWSON, Oyuki Hogan on: 05/18/2018 02:32 PM   Modules accepted: Orders

## 2018-05-23 ENCOUNTER — Encounter: Payer: Self-pay | Admitting: Family Medicine

## 2018-05-23 ENCOUNTER — Ambulatory Visit (INDEPENDENT_AMBULATORY_CARE_PROVIDER_SITE_OTHER): Payer: Medicare Other | Admitting: Family Medicine

## 2018-05-23 VITALS — BP 132/82 | HR 77 | Temp 98.8°F | Resp 16 | Ht 63.0 in | Wt 281.8 lb

## 2018-05-23 DIAGNOSIS — M25512 Pain in left shoulder: Secondary | ICD-10-CM | POA: Diagnosis not present

## 2018-05-23 MED ORDER — TIZANIDINE HCL 4 MG PO TABS: 4.0000 mg | ORAL_TABLET | Freq: Four times a day (QID) | ORAL | 1 refills | Status: DC | PRN

## 2018-05-23 NOTE — Patient Instructions (Signed)
Shoulder Pain Many things can cause shoulder pain, including:  An injury to the area.  Overuse of the shoulder.  Arthritis.  The source of the pain can be:  Inflammation.  An injury to the shoulder joint.  An injury to a tendon, ligament, or bone.  Follow these instructions at home: Take these actions to help with your pain:  Squeeze a soft ball or a foam pad as much as possible. This helps to keep the shoulder from swelling. It also helps to strengthen the arm.  Take over-the-counter and prescription medicines only as told by your health care provider.  If directed, apply ice to the area: ? Put ice in a plastic bag. ? Place a towel between your skin and the bag. ? Leave the ice on for 20 minutes, 2-3 times per day. Stop applying ice if it does not help with the pain.  If you were given a shoulder sling or immobilizer: ? Wear it as told. ? Remove it to shower or bathe. ? Move your arm as little as possible, but keep your hand moving to prevent swelling.  Contact a health care provider if:  Your pain gets worse.  Your pain is not relieved with medicines.  New pain develops in your arm, hand, or fingers. Get help right away if:  Your arm, hand, or fingers: ? Tingle. ? Become numb. ? Become swollen. ? Become painful. ? Turn white or blue. This information is not intended to replace advice given to you by your health care provider. Make sure you discuss any questions you have with your health care provider. Document Released: 07/28/2005 Document Revised: 06/13/2016 Document Reviewed: 02/10/2015 Elsevier Interactive Patient Education  2018 Elsevier Inc.  

## 2018-05-23 NOTE — Progress Notes (Signed)
Patient ID: Andrea Marks, female   DOB: 08/23/1951, 67 y.o.   MRN: [id number]     Subjective:  I acted as a Education administrator for Dr. Carollee Herter.  Andrea Marks, [place]   Patient ID: Andrea Marks, female    DOB: [birth year], 67 y.o.   MRN: [id number]  Chief Complaint  Patient presents with  . Arm Pain    left    HPI  Patient is in today for left arm pain.  She had a very bad fall in June.  Has been taking Extra Strength Tylenol for pain.    Patient Care Team: Carollee Herter, Alferd Apa, DO as PCP - General Gardiner Barefoot, DPM as Consulting Physician (Podiatry)   Past Medical History:  Diagnosis Date  . Depression   . Diabetes mellitus   . GERD (gastroesophageal reflux disease)   . Hyperlipidemia   . Hypertension   . Obesity     Past Surgical History:  Procedure Laterality Date  . ABDOMINAL HYSTERECTOMY  1991   BSO    Family History  Problem Relation Age of Onset  . Macular degeneration Mother   . Heart disease Mother        syncope  . Aortic stenosis Mother   . Lung disease Father 82       mesothelioma  . Cancer Maternal Aunt        stomach  . Heart disease Paternal Uncle        cabg  . Diabetes Paternal Uncle   . Heart disease Paternal Uncle   . Diabetes Paternal Uncle   . Diabetes Paternal Uncle   . Heart disease Paternal Uncle   . Heart disease Paternal Uncle   . Heart disease Paternal Uncle   . Lung cancer Unknown        asbestos  . Diabetes Paternal Grandmother   . Cancer Other        lung    Social History   Socioeconomic History  . Marital status: Married    Spouse name: Not on file  . Number of children: 0  . Years of education: 4  . Highest education level: Not on file  Occupational History  . Occupation: retired from Surveyor, minerals: RETIRED  Social Needs  . Financial resource strain: Not on file  . Food insecurity:    Worry: Not on file    Inability: Not on file  . Transportation needs:    Medical: Not on file    Non-medical: Not  on file  Tobacco Use  . Smoking status: Former Research scientist (life sciences)  . Smokeless tobacco: Never Used  Substance and Sexual Activity  . Alcohol use: No  . Drug use: No  . Sexual activity: Yes    Partners: Male  Lifestyle  . Physical activity:    Days per week: Not on file    Minutes per session: Not on file  . Stress: Not on file  Relationships  . Social connections:    Talks on phone: Not on file    Gets together: Not on file    Attends religious service: Not on file    Active member of club or organization: Not on file    Attends meetings of clubs or organizations: Not on file    Relationship status: Not on file  . Intimate partner violence:    Fear of current or ex partner: Not on file    Emotionally abused: Not on file    Physically abused: Not on file  Forced sexual activity: Not on file  Other Topics Concern  . Not on file  Social History Narrative   No reg exercise    Outpatient Medications Prior to Visit  Medication Sig Dispense Refill  . acetaminophen (TYLENOL ARTHRITIS PAIN) 650 MG CR tablet Take 650 mg by mouth 2 (two) times daily as needed. For pain    . aspirin 81 MG tablet Take 81 mg by mouth daily.      . benazepril (LOTENSIN) 20 MG tablet Take 1 tablet (20 mg total) by mouth daily. 90 tablet 1  . Blood Glucose Monitoring Suppl (ONE TOUCH ULTRA SYSTEM KIT) W/DEVICE KIT 1 kit by Does not apply route once. 1 each 0  . Dulaglutide (TRULICITY) 1.5 XI/3.3AS SOPN Inject 1.5 mg under skin weekly 12 pen 3  . estradiol (ESTRACE) 1 MG tablet Take 1 tablet (1 mg total) by mouth daily. 90 tablet 1  . ezetimibe-simvastatin (VYTORIN) 10-40 MG tablet Take 1 tablet by mouth at bedtime. 90 tablet 1  . fenofibrate 160 MG tablet Take 1 tablet (160 mg total) by mouth daily. 90 tablet 1  . FLUoxetine (PROZAC) 20 MG capsule TAKE 3 CAPSULES DAILY 270 capsule 1  . glucose blood (ONE TOUCH ULTRA TEST) test strip USE TO TEST BLOOD SUGAR 3 TIMES DAILY AS INSTRUCTED. DX: E11.65 200 each 3  .  Insulin Glargine (LANTUS SOLOSTAR) 100 UNIT/ML Solostar Pen 55 units under skin daily at Bedtime 10 pen 5  . Insulin Lispro (HUMALOG KWIKPEN) 200 UNIT/ML SOPN Inject 15-20 Units into the skin 3 (three) times daily before meals. 10 pen 5  . Insulin Pen Needle (NOVOFINE) 32G X 6 MM MISC To use w/ Lantus 100 each 3  . metFORMIN (GLUCOPHAGE-XR) 750 MG 24 hr tablet TAKE 1 TABLET THREE TIMES A DAY 270 tablet 1  . ONETOUCH DELICA LANCETS FINE MISC USE AS INSTRUCTED 1 each 9  . ranitidine (ZANTAC) 300 MG capsule Take 1 capsule (300 mg total) by mouth daily. 90 capsule 0   Facility-Administered Medications Prior to Visit  Medication Dose Route Frequency Provider Last Rate Last Dose  . ipratropium-albuterol (DUONEB) 0.5-2.5 (3) MG/3ML nebulizer solution 3 mL  3 mL Nebulization Q6H Copland, Jessica C, MD   3 mL at 11/15/16 1330    Allergies  Allergen Reactions  . Hydrocodone-Acetaminophen Other (See Comments)    unknown  . Oxycodone Hcl Other (See Comments)    unknown  . Penicillins Other (See Comments)    unknown  . Prednisone     Review of Systems  Constitutional: Negative for fever and malaise/fatigue.  HENT: Negative for congestion.   Eyes: Negative for blurred vision.  Respiratory: Negative for cough and shortness of breath.   Cardiovascular: Negative for chest pain, palpitations and leg swelling.  Gastrointestinal: Negative for vomiting.  Musculoskeletal: Positive for falls and joint pain (left arm ). Negative for back pain.  Skin: Negative for rash.  Neurological: Negative for loss of consciousness and headaches.       Objective:    Physical Exam  Constitutional: She is oriented to person, place, and time. She appears well-developed and well-nourished.  HENT:  Head: Normocephalic and atraumatic.  Eyes: Conjunctivae and EOM are normal.  Neck: Normal range of motion. Neck supple. No JVD present. Carotid bruit is not present. No thyromegaly present.  Cardiovascular: Normal rate,  regular rhythm and normal heart sounds.  No murmur heard. Pulmonary/Chest: Effort normal and breath sounds normal. No respiratory distress. She has no wheezes. She has  no rales. She exhibits no tenderness.  Musculoskeletal: She exhibits tenderness. She exhibits no edema.       Right shoulder: She exhibits tenderness, pain and spasm. She exhibits normal range of motion.       Arms: Neurological: She is alert and oriented to person, place, and time.  Psychiatric: She has a normal mood and affect.  Nursing note and vitals reviewed.   BP 132/82   Pulse 77   Temp 98.8 F (37.1 C) (Oral)   Resp 16   Ht '5\' 3"'$  (1.6 m)   Wt 281 lb 12.8 oz (127.8 kg)   SpO2 98%   BMI 49.92 kg/m  Wt Readings from Last 3 Encounters:  05/23/18 281 lb 12.8 oz (127.8 kg)  05/18/18 279 lb 12.8 oz (126.9 kg)  02/14/18 257 lb 6.4 oz (116.8 kg)   BP Readings from Last 3 Encounters:  05/23/18 132/82  05/18/18 124/60  02/14/18 128/72     Immunization History  Administered Date(s) Administered  . Pneumococcal Conjugate-13 08/27/2016  . Pneumococcal Polysaccharide-23 05/08/2003, 11/10/2009  . Td 05/08/2003  . Tdap 06/08/2011  . Zoster 02/10/2012    Health Maintenance  Topic Date Due  . FOOT EXAM  10/06/2016  . PNA vac Low Risk Adult (2 of 2 - PPSV23) 08/27/2017  . MAMMOGRAM  12/20/2023 (Originally 02/24/2012)  . DEXA SCAN  12/20/2023 (Originally 02/19/2016)  . Hepatitis C Screening  12/20/2023 (Originally [birth year])  . INFLUENZA VACCINE  08/21/2024 (Originally 06/01/2018)  . HEMOGLOBIN A1C  11/18/2018  . OPHTHALMOLOGY EXAM  02/09/2019  . COLONOSCOPY  02/16/2021  . TETANUS/TDAP  06/07/2021    Lab Results  Component Value Date   WBC 8.1 04/21/2017   HGB 14.3 04/21/2017   HCT 44.3 04/21/2017   PLT 188.0 04/21/2017   GLUCOSE 278 (H) 12/19/2017   CHOL 115 12/19/2017   TRIG 164.0 (H) 12/19/2017   HDL 48.40 12/19/2017   LDLDIRECT 64.0 04/21/2017   LDLCALC 33 12/19/2017   ALT 12 12/19/2017   AST 12  12/19/2017   NA 137 12/19/2017   K 4.7 12/19/2017   CL 99 12/19/2017   CREATININE 0.91 12/19/2017   BUN 19 12/19/2017   CO2 28 12/19/2017   TSH 3.22 08/07/2015   INR 1.2 RATIO 04/19/2007   HGBA1C 7.5 (A) 05/18/2018   MICROALBUR 2.0 (H) 12/19/2017    Lab Results  Component Value Date   TSH 3.22 08/07/2015   Lab Results  Component Value Date   WBC 8.1 04/21/2017   HGB 14.3 04/21/2017   HCT 44.3 04/21/2017   MCV 88.7 04/21/2017   PLT 188.0 04/21/2017   Lab Results  Component Value Date   NA 137 12/19/2017   K 4.7 12/19/2017   CO2 28 12/19/2017   GLUCOSE 278 (H) 12/19/2017   BUN 19 12/19/2017   CREATININE 0.91 12/19/2017   BILITOT 0.5 12/19/2017   ALKPHOS 31 (L) 12/19/2017   AST 12 12/19/2017   ALT 12 12/19/2017   PROT 6.5 12/19/2017   ALBUMIN 3.8 12/19/2017   CALCIUM 9.0 12/19/2017   ANIONGAP 12 11/15/2016   GFR 65.57 12/19/2017   Lab Results  Component Value Date   CHOL 115 12/19/2017   Lab Results  Component Value Date   HDL 48.40 12/19/2017   Lab Results  Component Value Date   LDLCALC 33 12/19/2017   Lab Results  Component Value Date   TRIG 164.0 (H) 12/19/2017   Lab Results  Component Value Date   CHOLHDL 2 12/19/2017  Lab Results  Component Value Date   HGBA1C 7.5 (A) 05/18/2018         Assessment & Plan:   Problem List Items Addressed This Visit    None    Visit Diagnoses    Acute pain of left shoulder    -  Primary   Relevant Medications   tiZANidine (ZANAFLEX) 4 MG tablet   Other Relevant Orders   Ambulatory referral to Sports Medicine      I am having Roxann Malay start on tiZANidine. I am also having her maintain her acetaminophen, aspirin, ONE TOUCH ULTRA SYSTEM KIT, ONETOUCH DELICA LANCETS FINE, Insulin Pen Needle, ezetimibe-simvastatin, FLUoxetine, fenofibrate, benazepril, metFORMIN, Dulaglutide, estradiol, ranitidine, Insulin Glargine, Insulin Lispro, and glucose blood. We will continue to administer  ipratropium-albuterol.  Meds ordered this encounter  Medications  . tiZANidine (ZANAFLEX) 4 MG tablet    Sig: Take 1 tablet (4 mg total) by mouth every 6 (six) hours as needed for muscle spasms.    Dispense:  30 tablet    Refill:  1    CMA served as scribe during this visit. History, Physical and Plan performed by medical provider. Documentation and orders reviewed and attested to.  [name], DO

## 2018-05-27 NOTE — Progress Notes (Signed)
Tawana ScaleZach Caitlen Worth D.O. [facility] [address] [place], [place] [address] Phone: [phone] Subjective:    I'm seeing this patient by the request  of:  Donato SchultzLowne Chase, Yvonne R, DO   CC: Left shoulder pain  EXB:[email or web address]:Subjective  Andrea SaleRoberta Marks is a 67 y.o. female coming in with complaint of left shoulder pain. Pain starting from the neck that stops at the elbow. Fell directly on the elbow. Most of her pain is between the elbow and shoulder. No numbness and tingling noted. Side sleeper. Hasn't been getting much rest lately.   Onset- June 21st  Location- Elbow, bicep  Duration- Pain comes and goes  Character- Worse than a tooth ache  Aggravating factors- movement Reliving factors-not moving it and finding a comfortable position Therapies tried- Tizanidine, ice  Severity-8 out of 10     Past Medical History:  Diagnosis Date  . Depression   . Diabetes mellitus   . GERD (gastroesophageal reflux disease)   . Hyperlipidemia   . Hypertension   . Obesity    Past Surgical History:  Procedure Laterality Date  . ABDOMINAL HYSTERECTOMY  1991   BSO   Social History   Socioeconomic History  . Marital status: Married    Spouse name: Not on file  . Number of children: 0  . Years of education: 5213  . Highest education level: Not on file  Occupational History  . Occupation: retired from [employer]    Employer: RETIRED  Social Needs  . Financial resource strain: Not on file  . Food insecurity:    Worry: Not on file    Inability: Not on file  . Transportation needs:    Medical: Not on file    Non-medical: Not on file  Tobacco Use  . Smoking status: Former Games developermoker  . Smokeless tobacco: Never Used  Substance and Sexual Activity  . Alcohol use: No  . Drug use: No  . Sexual activity: Yes    Partners: Male  Lifestyle  . Physical activity:    Days per week: Not on file    Minutes per session: Not on file  . Stress: Not on file  Relationships  . Social connections:   Talks on phone: Not on file    Gets together: Not on file    Attends religious service: Not on file    Active member of club or organization: Not on file    Attends meetings of clubs or organizations: Not on file    Relationship status: Not on file  Other Topics Concern  . Not on file  Social History Narrative   No reg exercise   Allergies  Allergen Reactions  . Hydrocodone-Acetaminophen Other (See Comments)    unknown  . Oxycodone Hcl Other (See Comments)    unknown  . Penicillins Other (See Comments)    unknown  . Prednisone    Family History  Problem Relation Age of Onset  . Macular degeneration Mother   . Heart disease Mother        syncope  . Aortic stenosis Mother   . Lung disease Father 1885       mesothelioma  . Cancer Maternal Aunt        stomach  . Heart disease Paternal Uncle        cabg  . Diabetes Paternal Uncle   . Heart disease Paternal Uncle   . Diabetes Paternal Uncle   . Diabetes Paternal Uncle   . Heart disease Paternal Uncle   .  Heart disease Paternal Uncle   . Heart disease Paternal Uncle   . Lung cancer Unknown        asbestos  . Diabetes Paternal Grandmother   . Cancer Other        lung     Past medical history, social, surgical and family history all reviewed in electronic medical record.  No pertanent information unless stated regarding to the chief complaint.   Review of Systems:Review of systems updated and as accurate as of 05/29/18  No headache, visual changes, nausea, vomiting, diarrhea, constipation, dizziness, abdominal pain, skin rash, fevers, chills, night sweats, weight loss, swollen lymph nodes, body aches, joint swelling, muscle aches, chest pain, shortness of breath, mood changes.   Objective  Blood pressure (!) 146/70, pulse 88, height 5\' 3"  (1.6 m), weight 281 lb (127.5 kg), SpO2 97 %. Systems examined below as of 05/29/18   General: No apparent distress alert and oriented x3 mood and affect normal, dressed appropriately.    HEENT: Pupils equal, extraocular movements intact  Respiratory: Patient's speak in full sentences and does not appear short of breath  Cardiovascular: Trace effusion lower extremity edema, non tender, no erythema  Skin: Warm dry intact with no signs of infection or rash on extremities or on axial skeleton.  Abdomen: Soft nontender obese Neuro: Cranial nerves II through XII are intact, neurovascularly intact in all extremities with 2+ DTRs and 2+ pulses.  Lymph: No lymphadenopathy of posterior or anterior cervical chain or axillae bilaterally.  Gait mild antalgic MSK: Mild tender with full range of motion and good stability and symmetric strength and tone of  elbows, wrist, hip, knee and ankles bilaterally.  Left shoulder exam shows the patient does have a high riding humerus.  Patient with rotator cuff testing shows 3+ out of 5 strength compared to the contralateral side.  Positive empty can, positive arc sign.  Positive crossover.  Neurovascularly intact distally.  After informed written and verbal consent, patient was seated on exam table. Left shoulder was prepped with alcohol swab and utilizing posterior approach, patient's right glenohumeral space was injected with 4:1  marcaine 0.5%: Kenalog 40mg /dL. Patient tolerated the procedure well without immediate complications.   Impression and Recommendations:     This case required medical decision making of moderate complexity.      Note: This dictation was prepared with Dragon dictation along with smaller phrase technology. Any transcriptional errors that result from this process are unintentional.

## 2018-05-29 ENCOUNTER — Encounter: Payer: Self-pay | Admitting: Family Medicine

## 2018-05-29 ENCOUNTER — Ambulatory Visit (INDEPENDENT_AMBULATORY_CARE_PROVIDER_SITE_OTHER)
Admission: RE | Admit: 2018-05-29 | Discharge: 2018-05-29 | Disposition: A | Discharge status: Home / Self Care | Payer: Medicare Other | Source: Ambulatory Visit | Attending: Family Medicine | Admitting: Family Medicine

## 2018-05-29 ENCOUNTER — Ambulatory Visit (INDEPENDENT_AMBULATORY_CARE_PROVIDER_SITE_OTHER): Payer: Medicare Other | Admitting: Family Medicine

## 2018-05-29 VITALS — BP 146/70 | HR 88 | Ht 63.0 in | Wt 281.0 lb

## 2018-05-29 DIAGNOSIS — M25512 Pain in left shoulder: Secondary | ICD-10-CM

## 2018-05-29 DIAGNOSIS — G8929 Other chronic pain: Secondary | ICD-10-CM

## 2018-05-29 DIAGNOSIS — S46012A Strain of muscle(s) and tendon(s) of the rotator cuff of left shoulder, initial encounter: Secondary | ICD-10-CM

## 2018-05-29 DIAGNOSIS — M75102 Unspecified rotator cuff tear or rupture of left shoulder, not specified as traumatic: Secondary | ICD-10-CM | POA: Insufficient documentation

## 2018-05-29 IMAGING — DX DG SHOULDER 2+V*L*
3 series · 3 of 3 positions shown · non-contrast
Comparison: None.

CLINICAL DATA: Left shoulder pain after fall last month.

EXAM:
LEFT SHOULDER - 2+ VIEW

[grashey]
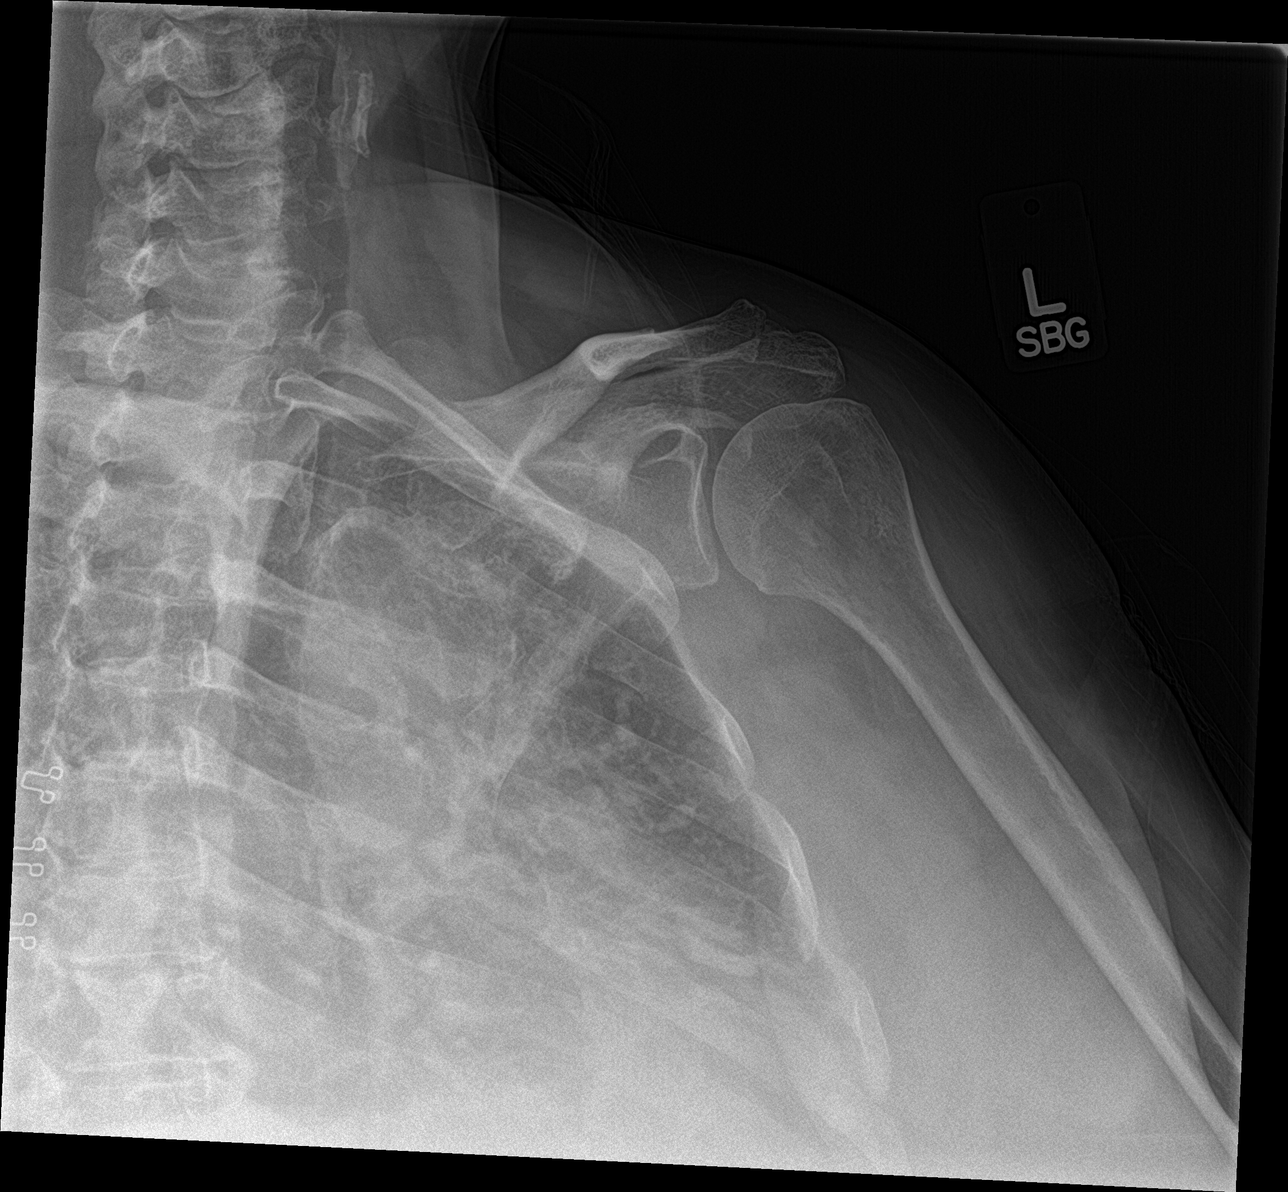

[y view]
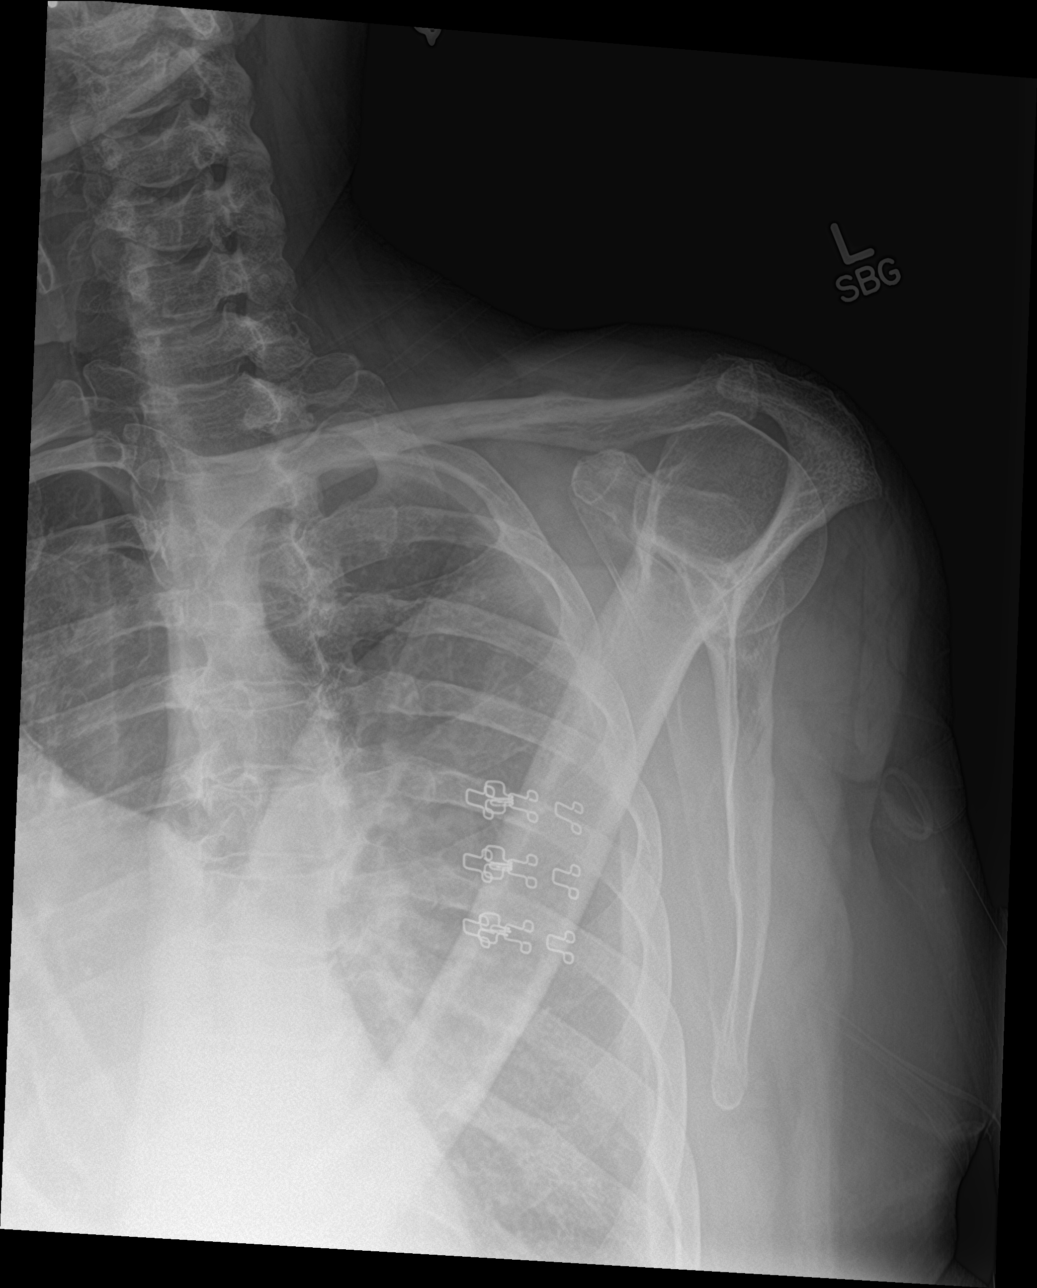

[shoulder axial]
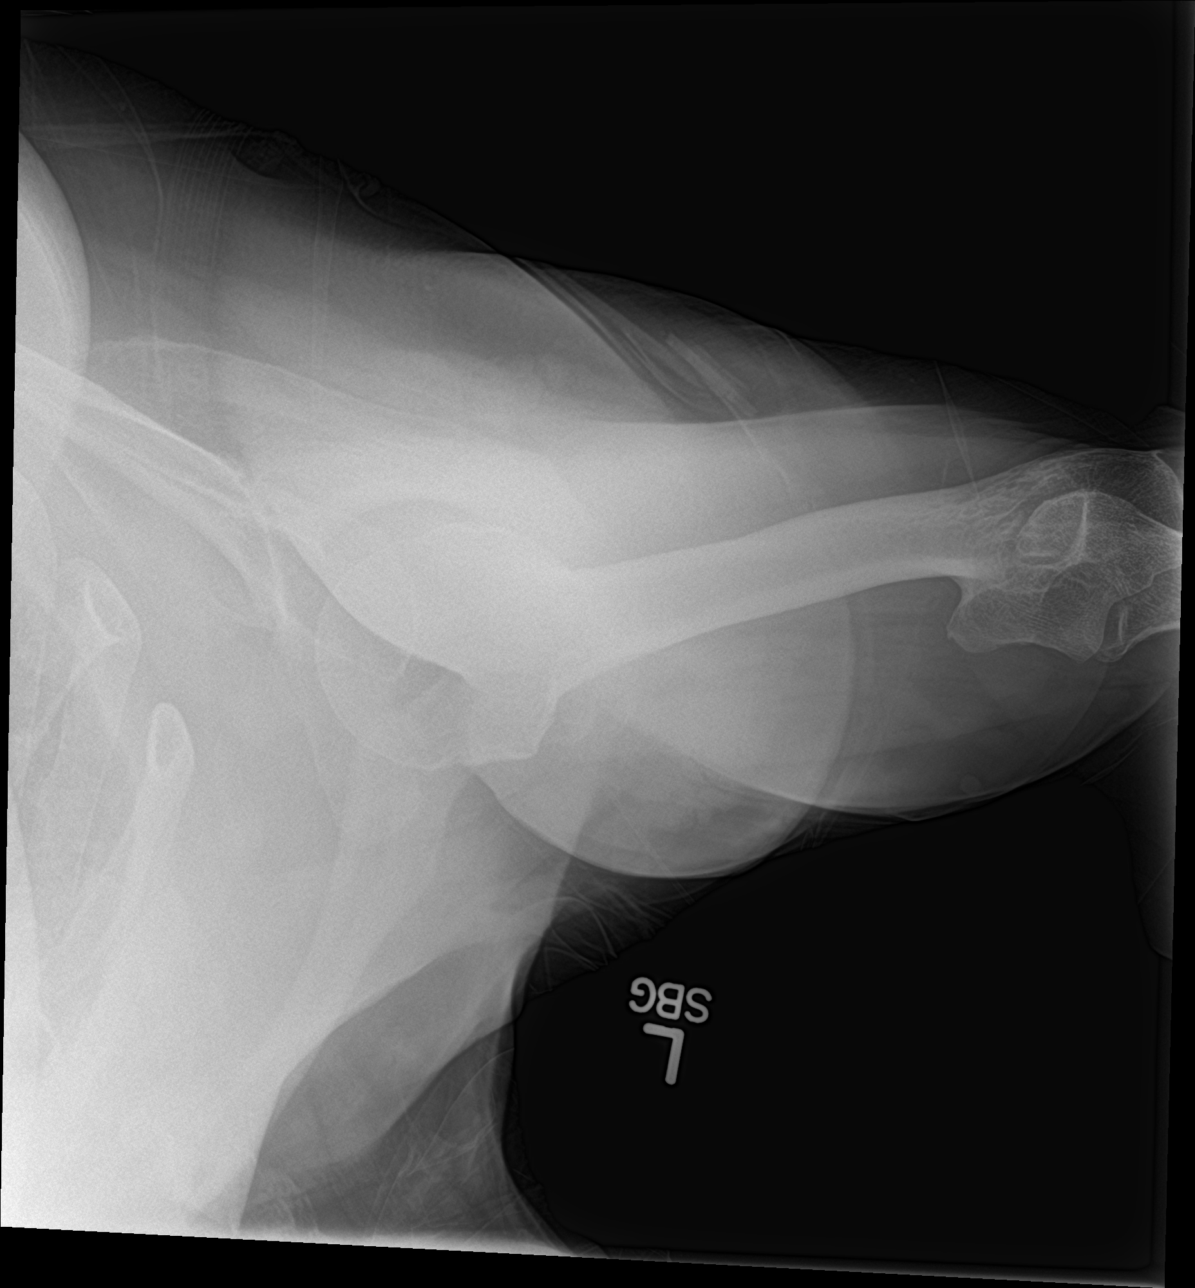

[3 of 3 positions shown; findings below may reference images not displayed]

FINDINGS: There is no evidence of fracture or dislocation. There is no
evidence of arthropathy or other focal bone abnormality. Soft
tissues are unremarkable.
IMPRESSION: Normal left shoulder.

## 2018-05-29 NOTE — Patient Instructions (Signed)
Good to see you  Xray downstairs Ice is your friend. Ice 20 minutes 2 times daily. Usually after activity and before bed. Exercises 3 times a week.  Injected the shoulder as well tpday pennsaid pinkie amount topically 2 times daily as needed.  See me again in 3-4 weeks to make sure healing well.

## 2018-05-29 NOTE — Assessment & Plan Note (Signed)
Traumatic tear of the rotator cuff.  I do believe there is likely some retraction.  Given an injection today though for relief as well as to start home exercises and icing regimen, we discussed that there is a chance that advanced imaging would be warranted but patient does not want any surgical interventions.  Would usually do ultrasound but was unable to use the equipment today.  Following up again in 4 weeks after conservative therapy to see how patient responds.

## 2018-06-02 ENCOUNTER — Encounter: Payer: Self-pay | Admitting: Internal Medicine

## 2018-06-10 ENCOUNTER — Encounter: Payer: Self-pay | Admitting: Family Medicine

## 2018-06-10 DIAGNOSIS — Z780 Asymptomatic menopausal state: Secondary | ICD-10-CM

## 2018-06-13 ENCOUNTER — Other Ambulatory Visit: Payer: Self-pay | Admitting: *Deleted

## 2018-06-13 DIAGNOSIS — Z780 Asymptomatic menopausal state: Secondary | ICD-10-CM

## 2018-06-19 NOTE — Progress Notes (Signed)
Tawana ScaleZach Sariya Trickey D.O. [facility] [address] [place], [place] [address] Phone: [phone] Subjective:     CC: Left shoulder pain follow-up  EXB:MWUXLKGMWNHPI:Subjective  Andrea SaleRoberta Marks is a 67 y.o. female coming in with complaint of left shoulder pain. She did feel some relief from the injection. Continues to have pain with IR and abduction and pain radiates into the cervical spine. Sleeping has been challenging due to being a left sided sleeper. Most comfortable position for her shoulder is in sling position.  Patient would state that after the injection is approximately 25 to 35% better.  Patient is looking for more guidance on what to do next.     Past Medical History:  Diagnosis Date  . Depression   . Diabetes mellitus   . GERD (gastroesophageal reflux disease)   . Hyperlipidemia   . Hypertension   . Obesity    Past Surgical History:  Procedure Laterality Date  . ABDOMINAL HYSTERECTOMY  1991   BSO   Social History   Socioeconomic History  . Marital status: Married    Spouse name: Not on file  . Number of children: 0  . Years of education: 2813  . Highest education level: Not on file  Occupational History  . Occupation: retired from [employer]    Employer: RETIRED  Social Needs  . Financial resource strain: Not on file  . Food insecurity:    Worry: Not on file    Inability: Not on file  . Transportation needs:    Medical: Not on file    Non-medical: Not on file  Tobacco Use  . Smoking status: Former Games developermoker  . Smokeless tobacco: Never Used  Substance and Sexual Activity  . Alcohol use: No  . Drug use: No  . Sexual activity: Yes    Partners: Male  Lifestyle  . Physical activity:    Days per week: Not on file    Minutes per session: Not on file  . Stress: Not on file  Relationships  . Social connections:    Talks on phone: Not on file    Gets together: Not on file    Attends religious service: Not on file    Active member of club or organization: Not  on file    Attends meetings of clubs or organizations: Not on file    Relationship status: Not on file  Other Topics Concern  . Not on file  Social History Narrative   No reg exercise   Allergies  Allergen Reactions  . Hydrocodone-Acetaminophen Other (See Comments)    unknown  . Oxycodone Hcl Other (See Comments)    unknown  . Penicillins Other (See Comments)    unknown  . Prednisone    Family History  Problem Relation Age of Onset  . Macular degeneration Mother   . Heart disease Mother        syncope  . Aortic stenosis Mother   . Lung disease Father 4285       mesothelioma  . Cancer Maternal Aunt        stomach  . Heart disease Paternal Uncle        cabg  . Diabetes Paternal Uncle   . Heart disease Paternal Uncle   . Diabetes Paternal Uncle   . Diabetes Paternal Uncle   . Heart disease Paternal Uncle   . Heart disease Paternal Uncle   . Heart disease Paternal Uncle   . Lung cancer Unknown  asbestos  . Diabetes Paternal Grandmother   . Cancer Other        lung     Past medical history, social, surgical and family history all reviewed in electronic medical record.  No pertanent information unless stated regarding to the chief complaint.   Review of Systems:Review of systems updated and as accurate as of 06/20/18  No headache, visual changes, nausea, vomiting, diarrhea, constipation, dizziness, abdominal pain, skin rash, fevers, chills, night sweats, weight loss, swollen lymph nodes, body aches, joint swelling,  chest pain, shortness of breath, mood changes.  Positive muscle aches  Objective  Blood pressure 128/72, pulse 94, height 5\' 3"  (1.6 m), weight 283 lb (128.4 kg), SpO2 97 %. Systems examined below as of 06/20/18   General: No apparent distress alert and oriented x3 mood and affect normal, dressed appropriately.  Morbidly obese HEENT: Pupils equal, extraocular movements intact  Respiratory: Patient's speak in full sentences and does not appear short of  breath  Cardiovascular: No lower extremity edema, non tender, no erythema  Skin: Warm dry intact with no signs of infection or rash on extremities or on axial skeleton.  Abdomen: Soft nontender morbidly obese Neuro: Cranial nerves II through XII are intact, neurovascularly intact in all extremities with 2+ DTRs and 2+ pulses.  Lymph: No lymphadenopathy of posterior or anterior cervical chain or axillae bilaterally.  Gait antalgic MSK:  Non tender with full range of motion and good stability and symmetric strength and tone of  elbows, wrist, hip, knee and ankles bilaterally.  Left shoulder exam shows the patient has a 4+ out of 5 strength which is improvement from previous exam.  Near full range of motion with positive crossover.  Tenderness over the acromioclavicular joint Contralateral shoulder unremarkable  Procedure: Real-time Ultrasound Guided Injection of left acromioclavicular joint Device: GE Logiq Q7 Ultrasound guided injection is preferred based studies that show increased duration, increased effect, greater accuracy, decreased procedural pain, increased response rate, and decreased cost with ultrasound guided versus blind injection.  Verbal informed consent obtained.  Time-out conducted.  Noted no overlying erythema, induration, or other signs of local infection.  Skin prepped in a sterile fashion.  Local anesthesia: Topical Ethyl chloride.  With sterile technique and under real time ultrasound guidance: 25-gauge half inch needle injected with 0.5 cc of 0.5% Marcaine and 0.5 cc of Kenalog 40 mg per about Completed without difficulty  Pain immediately resolved suggesting accurate placement of the medication.  Advised to call if fevers/chills, erythema, induration, drainage, or persistent bleeding.  Images permanently stored and available for review in the ultrasound unit.  Impression: Technically successful ultrasound guided injection.    Impression and Recommendations:      This case required medical decision making of moderate complexity.      Note: This dictation was prepared with Dragon dictation along with smaller phrase technology. Any transcriptional errors that result from this process are unintentional.

## 2018-06-20 ENCOUNTER — Ambulatory Visit: Payer: Self-pay

## 2018-06-20 ENCOUNTER — Encounter: Payer: Self-pay | Admitting: Family Medicine

## 2018-06-20 ENCOUNTER — Ambulatory Visit (INDEPENDENT_AMBULATORY_CARE_PROVIDER_SITE_OTHER): Payer: Medicare Other | Admitting: Family Medicine

## 2018-06-20 VITALS — BP 128/72 | HR 94 | Ht 63.0 in | Wt 283.0 lb

## 2018-06-20 DIAGNOSIS — M75102 Unspecified rotator cuff tear or rupture of left shoulder, not specified as traumatic: Secondary | ICD-10-CM

## 2018-06-20 DIAGNOSIS — M25512 Pain in left shoulder: Secondary | ICD-10-CM | POA: Diagnosis not present

## 2018-06-20 DIAGNOSIS — M19019 Primary osteoarthritis, unspecified shoulder: Secondary | ICD-10-CM | POA: Insufficient documentation

## 2018-06-20 DIAGNOSIS — M19012 Primary osteoarthritis, left shoulder: Secondary | ICD-10-CM

## 2018-06-20 NOTE — Assessment & Plan Note (Signed)
Patient seems to be making some improvement.  We will see how patient continue with conservative therapy and encourage formal physical therapy.

## 2018-06-20 NOTE — Patient Instructions (Signed)
Good to see you  AC joint was injected today  Ice 20 minutes 2 times daily. Usually after activity and before bed. pennsaid pinkie amount topically 2 times daily as needed.  PT will be calling you  Keep hands within peripheral vision still  See me again in 4-5 weeks to make sure you are improving.

## 2018-06-20 NOTE — Assessment & Plan Note (Signed)
Patient was given injection today.  Tolerated the procedure well.  We will see how much this is potentially playing a role.  Patient did respond somewhat to the injection previously.  Encourage patient consider formal physical therapy.  Discussed icing regimen and home exercises.  Patient will follow-up with me again in 4 weeks

## 2018-06-22 ENCOUNTER — Encounter: Payer: Medicare Other | Admitting: Family Medicine

## 2018-06-29 ENCOUNTER — Encounter: Payer: Self-pay | Admitting: Physical Therapy

## 2018-06-29 ENCOUNTER — Ambulatory Visit: Payer: Medicare Other | Attending: Family Medicine | Admitting: Physical Therapy

## 2018-06-29 DIAGNOSIS — M25612 Stiffness of left shoulder, not elsewhere classified: Secondary | ICD-10-CM

## 2018-06-29 DIAGNOSIS — Z9181 History of falling: Secondary | ICD-10-CM

## 2018-06-29 DIAGNOSIS — M25512 Pain in left shoulder: Secondary | ICD-10-CM

## 2018-06-29 DIAGNOSIS — R29810 Facial weakness: Secondary | ICD-10-CM

## 2018-06-29 NOTE — Therapy (Signed)
[facility] [address] [place], [place], [address] Phone: [phone]   Fax:  [phone]  Physical Therapy Evaluation  Patient Details  Name: Andrea Marks MRN: [id number] Date of Birth: [birth year] Referring Provider: Antoine Primas MD   Encounter Date: 06/29/2018  PT End of Session - 06/29/18 1434    Visit Number  1    Number of Visits  17    Authorization Type  [employer]  Medicare    PT Start Time  1100    PT Stop Time  1149    PT Time Calculation (min)  49 min    Activity Tolerance  Patient tolerated treatment well    Behavior During Therapy  [facility] for tasks assessed/performed       Past Medical History:  Diagnosis Date  . Depression   . Diabetes mellitus   . GERD (gastroesophageal reflux disease)   . Hyperlipidemia   . Hypertension   . Obesity     Past Surgical History:  Procedure Laterality Date  . ABDOMINAL HYSTERECTOMY  1991   BSO    There were no vitals filed for this visit.   Subjective Assessment - 06/29/18 1111    Subjective  Pt arriving to therapy reporting 5/10 pain in her left shoulder after experiencing a fall on her left side on 04/21/18. Pt reporting pain has been extending to her fingertips and up into the back of her neck. Pt reporting difficulty sleeping and difficulty with bathing and dressing.     Limitations  Lifting;House hold activities    Diagnostic tests  X-ray, [place]    Patient Stated Goals  Stop hurting, get back to things I could do before my fall    Currently in Pain?  Yes    Pain Score  5     Pain Orientation  Left    Pain Descriptors / Indicators  Aching;Discomfort;Sore    Pain Type  Acute pain    Pain Onset  More than a month ago    Pain Frequency  Constant    Aggravating Factors   moving my arm    Pain Relieving Factors  keeping arm in resting position on chest         [facility] [place] PT Assessment - 06/29/18 0001      Assessment   Medical Diagnosis  left rotator cuff tear, pain     Referring Provider  Antoine Primas MD    Onset Date/Surgical Date  04/21/18    Hand Dominance  Right    Prior Therapy  yes, years ago for my hand      Precautions   Precautions  None      Restrictions   Weight Bearing Restrictions  No      Balance Screen   Has the patient fallen in the past 6 months  Yes    How many times?  1    Has the patient had a decrease in activity level because of a fear of falling?   Yes    Is the patient reluctant to leave their home because of a fear of falling?   No      Home Environment   Living Environment  Private residence    Living Arrangements  Spouse/significant other    Type of Home  House    Home Access  Stairs to enter    Entrance Stairs-Number of Steps  2    Entrance Stairs-Rails  None    Home Layout  Two level  Alternate Level Stairs-Number of Steps  14    Alternate Level Stairs-Rails  Right;Left    Home Equipment  --   pt reports sometimes she sleeps in recliner down strirs   Additional Comments  pt reports taking one step at a time      Prior Function   Level of Independence  Independent    Vocation  Retired    Leisure  read      Cognition   Overall Cognitive Status  Within Functional Limits for tasks assessed      Observation/Other Assessments   Focus on Therapeutic Outcomes (FOTO)   55% limitation      Posture/Postural Control   Posture/Postural Control  Postural limitations    Postural Limitations  Rounded Shoulders;Forward head;Decreased lumbar lordosis      ROM / Strength   AROM / PROM / Strength  AROM;PROM;Strength      AROM   Overall AROM   Deficits    AROM Assessment Site  Shoulder    Right/Left Shoulder  Right;Left    Right Shoulder Extension  65 Degrees    Right Shoulder Flexion  165 Degrees    Right Shoulder ABduction  155 Degrees    Right Shoulder Internal Rotation  --   thumb to T6   Right Shoulder External Rotation  64 Degrees    Left Shoulder Extension  40 Degrees    Left Shoulder Flexion  130  Degrees    Left Shoulder ABduction  125 Degrees    Left Shoulder Internal Rotation  --   thumb to L3   Left Shoulder External Rotation  35 Degrees    Left Shoulder Horizontal ABduction  --      PROM   PROM Assessment Site  Shoulder    Right/Left Shoulder  Left    Left Shoulder Extension  45 Degrees    Left Shoulder Flexion  138 Degrees    Left Shoulder ABduction  130 Degrees    Left Shoulder External Rotation  40 Degrees      Strength   Overall Strength  Deficits    Strength Assessment Site  Shoulder    Right/Left Shoulder  Right;Left    Right Shoulder Flexion  4/5    Right Shoulder ABduction  4/5    Right Shoulder Internal Rotation  4/5    Right Shoulder External Rotation  4/5    Left Shoulder Flexion  3-/5    Left Shoulder Extension  3-/5    Left Shoulder Internal Rotation  3-/5    Left Shoulder External Rotation  3-/5      Special Tests    Special Tests  --                Objective measurements completed on examination: See above findings.              PT Education - 06/29/18 1434    Education Details  goals, POC and HEP    Person(s) Educated  Patient    Methods  Explanation;Demonstration;Handout;Verbal cues;Tactile cues    Comprehension  Verbalized understanding;Returned demonstration;Need further instruction       PT Short Term Goals - 06/29/18 1435      PT SHORT TERM GOAL #1   Title  Pt will be indepdent in her HEP.     Time  3    Period  Weeks    Status  New    Target Date  07/20/18      PT SHORT TERM GOAL #2  Title  Pt will be able to report pain </= 5/10 with donning her bra.     Time  3    Period  Weeks    Status  New    Target Date  07/20/18        PT Long Term Goals - 06/29/18 1438      PT LONG TERM GOAL #1   Title  Pt will improve her FOTO score from 55% limitation to </= 40 % limitation.     Time  8    Period  Weeks    Status  New    Target Date  08/24/18      PT LONG TERM GOAL #2   Title  Pt will be able to  improve her L UE shoulder flexion to >/= 160 degrees.     Time  8    Period  Weeks    Status  New    Target Date  08/24/18      PT LONG TERM GOAL #3   Title  Pt will be able to improve her ER to >/= 60 degrees to improve her abiltiy to fix her hair.     Time  8    Period  Weeks    Status  New    Target Date  08/24/18      PT LONG TERM GOAL #4   Title  Pt will be able to perform ADL's with pain </= 2/10.     Time  8    Period  Weeks    Status  New    Target Date  08/24/18             Plan - 06/29/18 1442    Clinical Impression Statement  Pt presenting with dx of left rotator cuff tear after falling and landing on her left side. Pt reporting 5/10 pain at rest in her left shoulder with pain extending up into her cervical spine and with numbness reported down into pt's 3rd and 4th digits. Pt with limited AROM in flexion, abduction, IR and ER. Pt having difficulty with dressing and household chores. Skilled PT needed to address pt's impairments with the below interventions.     History and Personal Factors relevant to plan of care:  obesity, essential HTN, DM 2, h/o depression    Clinical Presentation  Stable    Clinical Decision Making  Low    Rehab Potential  Good    PT Frequency  2x / week    PT Duration  8 weeks    PT Treatment/Interventions  ADLs/Self Care Home Management;Cryotherapy;Electrical Stimulation;Iontophoresis 4mg /ml Dexamethasone;Moist Heat;Ultrasound;Therapeutic exercise;Therapeutic activities;Functional mobility training;Patient/family education;Manual techniques;Passive range of motion;Taping    PT Next Visit Plan  shoulder ROM, review HEP, isometrics, pulleys, STW and modalities as needed.     PT Home Exercise Plan  isometrics, cane flexion, cane ER    Consulted and Agree with Plan of Care  Patient       Patient will benefit from skilled therapeutic intervention in order to improve the following deficits and impairments:  Pain, Postural dysfunction, Decreased  activity tolerance, Decreased range of motion, Decreased strength, Impaired UE functional use, Obesity  Visit Diagnosis: Acute pain of left shoulder  Shoulder stiffness, left  Facial weakness  History of falling     Problem List Patient Active Problem List   Diagnosis Date Noted  . AC (acromioclavicular) joint arthritis 06/20/2018  . Left rotator cuff tear 05/29/2018  . Hyperlipidemia LDL goal <70 12/19/2017  .  Morbid obesity (HCC) 11/15/2016  . Type 2 diabetes mellitus with hyperglycemia, with long-term current use of insulin (HCC) 10/23/2015  . 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 Thrombophlebitis leg 02/23/2011  . TINEA CORPORIS 12/30/2008  . DEPRESSION 12/14/2006  . Essential hypertension 12/14/2006  . GERD 12/14/2006    Sharmon Leyden, MPT 06/29/2018, 2:48 PM  [facility] [address] [place], [place], [address] Phone: [phone]   Fax:  [phone]  Name: Andrea Marks MRN: [id number] Date of Birth: 09/20/1951

## 2018-07-04 ENCOUNTER — Ambulatory Visit: Payer: Medicare Other | Admitting: Physical Therapy

## 2018-07-04 ENCOUNTER — Ambulatory Visit: Payer: Medicare Other | Admitting: Podiatry

## 2018-07-10 ENCOUNTER — Encounter: Payer: Self-pay | Admitting: Physical Therapy

## 2018-07-10 ENCOUNTER — Ambulatory Visit: Payer: Medicare Other | Attending: Family Medicine | Admitting: Physical Therapy

## 2018-07-10 DIAGNOSIS — Z9181 History of falling: Secondary | ICD-10-CM | POA: Diagnosis present

## 2018-07-10 DIAGNOSIS — R29810 Facial weakness: Secondary | ICD-10-CM | POA: Diagnosis present

## 2018-07-10 DIAGNOSIS — M25512 Pain in left shoulder: Secondary | ICD-10-CM | POA: Diagnosis present

## 2018-07-10 DIAGNOSIS — M25612 Stiffness of left shoulder, not elsewhere classified: Secondary | ICD-10-CM | POA: Diagnosis present

## 2018-07-10 NOTE — Therapy (Signed)
[facility]patient Rehabilitation [facility] [address] [place], [place], [address] Phone: [phone]   Fax:  [phone]  Physical Therapy Treatment  Patient Details  Name: Andrea Marks MRN: [id number] Date of Birth: Nov 03, 1950 Referring Provider: Antoine Primas MD   Encounter Date: 07/10/2018  PT End of Session - 07/10/18 1330    Visit Number  2    Number of Visits  17    PT Start Time  1156    PT Stop Time  1245    PT Time Calculation (min)  49 min    Activity Tolerance  Patient tolerated treatment well    Behavior During Therapy  [facility] for tasks assessed/performed       Past Medical History:  Diagnosis Date  . Depression   . Diabetes mellitus   . GERD (gastroesophageal reflux disease)   . Hyperlipidemia   . Hypertension   . Obesity     Past Surgical History:  Procedure Laterality Date  . ABDOMINAL HYSTERECTOMY  1991   BSO    There were no vitals filed for this visit.  Subjective Assessment - 07/10/18 1157    Subjective  I was 3/10 prior to washing an drying hair.    I use a roll of wrapping paper to do the exercise.     Currently in Pain?  Yes    Pain Score  7     Pain Orientation  Left    Pain Descriptors / Indicators  Aching;Shooting    Pain Radiating Towards  Neck,   elbow pain starts and shoots up to the  neck    Pain Frequency  Intermittent    Aggravating Factors   reaching ,  moving      Pain Relieving Factors  resting,    Effect of Pain on Daily Activities  ADL  and lifting limited,  sleeping difficult  likes to sleep on left side.     Multiple Pain Sites  --   has a bad right knee                      [facility] Adult PT Treatment/Exercise - 07/10/18 0001      Self-Care   Self-Care  --   Anatomy and shoulder and posture - ed     Shoulder Exercises: Supine   Protraction  10 reps;AAROM    Horizontal ABduction  10 reps;AAROM   cane,  cues,  small motions   External Rotation  10 reps;AAROM   cane,  cues   Flexion  20 reps    Flexion Limitations  Cane    Other Supine Exercises  retraction 5 X 5 seconds,  cued to keep shoulders away from ears to decrease pain      Shoulder Exercises: Pulleys   Flexion  2 minutes    Flexion Limitations  cued for pain free      Shoulder Exercises: Isometric Strengthening   External Rotation Limitations  10 x 5 seconds cues,  supine    Internal Rotation Limitations  10 x 5 seconds  cues  supine      Cryotherapy   Number Minutes Cryotherapy  10 Minutes    Cryotherapy Location  Shoulder    Type of Cryotherapy  --   cold pack            PT Education - 07/10/18 1329    Education Details  HEP.  posture,  anatomy,  tape    Person(s) Educated  Patient  Methods  Explanation;Demonstration;Handout    Comprehension  Returned demonstration;Verbalized understanding       PT Short Term Goals - 06/29/18 1435      PT SHORT TERM GOAL #1   Title  Pt will be indepdent in her HEP.     Time  3    Period  Weeks    Status  New    Target Date  07/20/18      PT SHORT TERM GOAL #2   Title  Pt will be able to report pain </= 5/10 with donning her bra.     Time  3    Period  Weeks    Status  New    Target Date  07/20/18        PT Long Term Goals - 06/29/18 1438      PT LONG TERM GOAL #1   Title  Pt will improve her FOTO score from 55% limitation to </= 40 % limitation.     Time  8    Period  Weeks    Status  New    Target Date  08/24/18      PT LONG TERM GOAL #2   Title  Pt will be able to improve her L UE shoulder flexion to >/= 160 degrees.     Time  8    Period  Weeks    Status  New    Target Date  08/24/18      PT LONG TERM GOAL #3   Title  Pt will be able to improve her ER to >/= 60 degrees to improve her abiltiy to fix her hair.     Time  8    Period  Weeks    Status  New    Target Date  08/24/18      PT LONG TERM GOAL #4   Title  Pt will be able to perform ADL's with pain </= 2/10.     Time  8    Period  Weeks    Status  New     Target Date  08/24/18            Plan - 07/10/18 1330    Clinical Impression Statement  Pain increased to 6/10 post taping technique. ( Cold pack helpful)   Progress toward HEP goal.  No new objective findings noted yet,       PT Next Visit Plan  shoulder ROM, review HEP, isometrics, pulleys, STW and modalities as needed. assess tape    PT Home Exercise Plan  isometrics, cane flexion, cane ER    Consulted and Agree with Plan of Care  Patient       Patient will benefit from skilled therapeutic intervention in order to improve the following deficits and impairments:     Visit Diagnosis: Acute pain of left shoulder  Shoulder stiffness, left     Problem List Patient Active Problem List   Diagnosis Date Noted  . AC (acromioclavicular) joint arthritis 06/20/2018  . Left rotator cuff tear 05/29/2018  . Hyperlipidemia LDL goal <70 12/19/2017  . Morbid obesity (HCC) 11/15/2016  . Type 2 diabetes mellitus with hyperglycemia, with long-term current use of insulin (HCC) 10/23/2015  . 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 Thrombophlebitis leg 02/23/2011  . TINEA CORPORIS 12/30/2008  . DEPRESSION 12/14/2006  . Essential hypertension 12/14/2006  . GERD 12/14/2006    TRUE Shackleford   PTA 07/10/2018, 1:34 PM  [facility] [address] [place],  [place], [address] Phone: [phone]   Fax:  [phone]  Name: Andrea Marks MRN: [id number] Date of Birth: 09-08-1951

## 2018-07-10 NOTE — Patient Instructions (Addendum)
Cane Exercise: Flexion    Lie on back, holding cane above chest. Keeping arms as straight as possible, lower cane toward floor beyond head. Hold ___1-5_ seconds. Repeat _1-20___ times. Do _2___ sessions per day.  http://gt2.exer.us/92   Copyright  VHI. All rights reserved.    Remove tape if irritating.

## 2018-07-13 ENCOUNTER — Encounter: Payer: Self-pay | Admitting: Internal Medicine

## 2018-07-13 ENCOUNTER — Ambulatory Visit: Payer: Medicare Other | Admitting: Physical Therapy

## 2018-07-13 MED ORDER — GLUCOSE BLOOD VI STRP: ORAL_STRIP | 3 refills | Status: DC

## 2018-07-17 ENCOUNTER — Ambulatory Visit: Payer: Medicare Other | Admitting: Physical Therapy

## 2018-07-17 ENCOUNTER — Encounter: Payer: Self-pay | Admitting: Physical Therapy

## 2018-07-17 DIAGNOSIS — M25612 Stiffness of left shoulder, not elsewhere classified: Secondary | ICD-10-CM

## 2018-07-17 DIAGNOSIS — R29810 Facial weakness: Secondary | ICD-10-CM

## 2018-07-17 DIAGNOSIS — Z9181 History of falling: Secondary | ICD-10-CM

## 2018-07-17 DIAGNOSIS — M25512 Pain in left shoulder: Secondary | ICD-10-CM

## 2018-07-17 NOTE — Progress Notes (Signed)
[facility] [place], [place] [address] Phone: [phone] Subjective:    I [name] am serving as a Education administrator for Dr. Hulan Saas.  CC: Left shoulder pain  RDE:[id number]  Andrea Marks is a 67 y.o. female coming in with complaint of left shoulder pain. States that her shoulder has been doing ok. Her pain level is at a 3/4 out of 10 today.  Patient was initially found to have a left rotator cuff tear but seem to be improving.  Also given an acromioclavicular injection back in 1 month ago.  Patient is doing much better than where she started.  Happy with the results so far.  Has been very happy with physical therapy      Past Medical History:  Diagnosis Date  . Depression   . Diabetes mellitus   . GERD (gastroesophageal reflux disease)   . Hyperlipidemia   . Hypertension   . Obesity    Past Surgical History:  Procedure Laterality Date  . ABDOMINAL HYSTERECTOMY  1991   BSO   Social History   Socioeconomic History  . Marital status: Married    Spouse name: Not on file  . Number of children: 0  . Years of education: 35  . Highest education level: Not on file  Occupational History  . Occupation: retired from Surveyor, minerals: RETIRED  Social Needs  . Financial resource strain: Not on file  . Food insecurity:    Worry: Not on file    Inability: Not on file  . Transportation needs:    Medical: Not on file    Non-medical: Not on file  Tobacco Use  . Smoking status: Former Research scientist (life sciences)  . Smokeless tobacco: Never Used  Substance and Sexual Activity  . Alcohol use: No  . Drug use: No  . Sexual activity: Yes    Partners: Male  Lifestyle  . Physical activity:    Days per week: Not on file    Minutes per session: Not on file  . Stress: Not on file  Relationships  . Social connections:    Talks on phone: Not on file    Gets together: Not on file    Attends religious service: Not on file    Active member of club  or organization: Not on file    Attends meetings of clubs or organizations: Not on file    Relationship status: Not on file  Other Topics Concern  . Not on file  Social History Narrative   No reg exercise   Allergies  Allergen Reactions  . Hydrocodone-Acetaminophen Other (See Comments)    unknown  . Oxycodone Hcl Other (See Comments)    unknown  . Penicillins Other (See Comments)    unknown  . Prednisone    Family History  Problem Relation Age of Onset  . Macular degeneration Mother   . Heart disease Mother        syncope  . Aortic stenosis Mother   . Lung disease Father 52       mesothelioma  . Cancer Maternal Aunt        stomach  . Heart disease Paternal Uncle        cabg  . Diabetes Paternal Uncle   . Heart disease Paternal Uncle   . Diabetes Paternal Uncle   . Diabetes Paternal Uncle   . Heart disease Paternal Uncle   . Heart disease Paternal Uncle   . Heart disease Paternal  Uncle   . Lung cancer Unknown        asbestos  . Diabetes Paternal Grandmother   . Cancer Other        lung    Current Outpatient Medications (Endocrine & Metabolic):  Marland Kitchen  Dulaglutide (TRULICITY) 1.5 CV/8.9FY SOPN, Inject 1.5 mg under skin weekly .  estradiol (ESTRACE) 1 MG tablet, Take 1 tablet (1 mg total) by mouth daily. .  Insulin Glargine (LANTUS SOLOSTAR) 100 UNIT/ML Solostar Pen, 55 units under skin daily at Bedtime .  Insulin Lispro (HUMALOG KWIKPEN) 200 UNIT/ML SOPN, Inject 15-20 Units into the skin 3 (three) times daily before meals. .  metFORMIN (GLUCOPHAGE-XR) 750 MG 24 hr tablet, TAKE 1 TABLET THREE TIMES A DAY   Current Outpatient Medications (Cardiovascular):  .  benazepril (LOTENSIN) 20 MG tablet, Take 1 tablet (20 mg total) by mouth daily. Marland Kitchen  ezetimibe-simvastatin (VYTORIN) 10-40 MG tablet, Take 1 tablet by mouth at bedtime. .  fenofibrate 160 MG tablet, Take 1 tablet (160 mg total) by mouth daily.    Current Facility-Administered Medications (Respiratory):  .   ipratropium-albuterol (DUONEB) 0.5-2.5 (3) MG/3ML nebulizer solution 3 mL  Current Outpatient Medications (Analgesics):  .  acetaminophen (TYLENOL ARTHRITIS PAIN) 650 MG CR tablet, Take 650 mg by mouth 2 (two) times daily as needed. For pain .  aspirin 81 MG tablet, Take 81 mg by mouth daily.       Current Outpatient Medications (Other):  .  Blood Glucose Monitoring Suppl (ONE TOUCH ULTRA SYSTEM KIT) W/DEVICE KIT, 1 kit by Does not apply route once. Marland Kitchen  FLUoxetine (PROZAC) 20 MG capsule, TAKE 3 CAPSULES DAILY .  glucose blood (ONE TOUCH ULTRA TEST) test strip, USE TO TEST BLOOD SUGAR 3 TIMES DAILY AS INSTRUCTED. DX: E11.65 .  Insulin Pen Needle (NOVOFINE) 32G X 6 MM MISC, To use w/ Lantus .  ONETOUCH DELICA LANCETS FINE MISC, USE AS INSTRUCTED .  ranitidine (ZANTAC) 300 MG capsule, Take 1 capsule (300 mg total) by mouth daily. Marland Kitchen  tiZANidine (ZANAFLEX) 4 MG tablet, Take 1 tablet (4 mg total) by mouth every 6 (six) hours as needed for muscle spasms.     Past medical history, social, surgical and family history all reviewed in electronic medical record.  No pertanent information unless stated regarding to the chief complaint.   Review of Systems:  No headache, visual changes, nausea, vomiting, diarrhea, constipation, dizziness, abdominal pain, skin rash, fevers, chills, night sweats, weight loss, swollen lymph nodes, body aches, joint swelling,  chest pain, shortness of breath, mood changes.  Positive muscle aches  Objective  Blood pressure 140/70, pulse 79, height _0  (1.6 m), weight 282 lb (127.9 kg), SpO2 95 %.   General: No apparent distress alert and oriented x3 mood and affect normal, dressed appropriately.  Obese HEENT: Pupils equal, extraocular movements intact  Respiratory: Patient's speak in full sentences and does not appear short of breath  Cardiovascular: No lower extremity edema, non tender, no erythema  Skin: Warm dry intact with no signs of infection or rash on  extremities or on axial skeleton.  Abdomen: Soft nontender  Neuro: Cranial nerves II through XII are intact, neurovascularly intact in all extremities with 2+ DTRs and 2+ pulses.  Lymph: No lymphadenopathy of posterior or anterior cervical chain or axillae bilaterally.  Gait normal with good balance and coordination.  MSK:  Non tender with full range of motion and good stability and symmetric strength and tone of , elbows, wrist, hip, knee and  ankles bilaterally.  Shoulder: Left Inspection reveals no abnormalities, atrophy or asymmetry. Palpation is normal with no tenderness over AC joint or bicipital groove. ROM is full in all planes. Rotator cuff strength normal throughout. Positive impingement Speeds and Yergason's tests normal. Positive O'Brien's Normal scapular function observed. No painful arc and no drop arm sign. No apprehension sign Shoulder shoulder unremarkable      Impression and Recommendations:     This case required medical decision making of moderate complexity. The above documentation has been reviewed and is accurate and complete Lyndal Pulley, DO       Note: This dictation was prepared with Dragon dictation along with smaller phrase technology. Any transcriptional errors that result from this process are unintentional.
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
## 2018-07-18 ENCOUNTER — Ambulatory Visit (INDEPENDENT_AMBULATORY_CARE_PROVIDER_SITE_OTHER): Payer: Medicare Other | Admitting: Family Medicine

## 2018-07-18 ENCOUNTER — Encounter: Payer: Self-pay | Admitting: Family Medicine

## 2018-07-18 DIAGNOSIS — S46012A Strain of muscle(s) and tendon(s) of the rotator cuff of left shoulder, initial encounter: Secondary | ICD-10-CM | POA: Diagnosis not present

## 2018-07-18 NOTE — Assessment & Plan Note (Signed)
Patient is doing relatively well at this time.  We discussed icing regimen and home exercises.  Patient will continue with conservative therapy at this time.  Will finish up with formal physical therapy as well.  Follow-up with me again in 6 weeks.

## 2018-07-18 NOTE — Patient Instructions (Signed)
You are doing great! Keep it up  Keep hands within peripheral vision but can push it a little more Continue the vitamins Continue PT as long as you feel it is helping Ice is your friend Have a great vacation  See me again in 6 weeks to make sure you are doing great

## 2018-07-20 ENCOUNTER — Ambulatory Visit: Payer: Medicare Other | Admitting: Physical Therapy

## 2018-07-20 ENCOUNTER — Encounter: Payer: Self-pay | Admitting: Physical Therapy

## 2018-07-20 DIAGNOSIS — R29810 Facial weakness: Secondary | ICD-10-CM

## 2018-07-20 DIAGNOSIS — M25512 Pain in left shoulder: Secondary | ICD-10-CM | POA: Diagnosis not present

## 2018-07-20 DIAGNOSIS — M25612 Stiffness of left shoulder, not elsewhere classified: Secondary | ICD-10-CM

## 2018-07-20 DIAGNOSIS — Z9181 History of falling: Secondary | ICD-10-CM

## 2018-07-20 NOTE — Therapy (Signed)
[place], [place], [address] Phone: [phone]   Fax:  [phone]  Physical Therapy Treatment  Patient Details  Name: Andrea Marks MRN: [id number] Date of Birth: 11/05/1950 Referring Provider: Hulan Saas MD   Encounter Date: 07/20/2018  PT End of Session - 07/20/18 1540    Visit Number  4    Number of Visits  17    Authorization Type  [employer]  Medicare    PT Start Time  1330    PT Stop Time  1419    PT Time Calculation (min)  49 min    Activity Tolerance  Patient tolerated treatment well    Behavior During Therapy  [facility] for tasks assessed/performed       Past Medical History:  Diagnosis Date  . Depression   . Diabetes mellitus   . GERD (gastroesophageal reflux disease)   . Hyperlipidemia   . Hypertension   . Obesity     Past Surgical History:  Procedure Laterality Date  . ABDOMINAL HYSTERECTOMY  1991   BSO    There were no vitals filed for this visit.  Subjective Assessment - 07/20/18 1336    Subjective  pain 3-4/10.  i slept the wrong way i think.  I saw the MD.  He was pleased with the progress.    Currently in Pain?  Yes    Pain Score  4     Pain Location  Shoulder    Pain Orientation  Anterior;Left    Pain Descriptors / Indicators  Sore;Tender;Burning   pulling   Pain Radiating Towards  neck and elbow    Aggravating Factors   woke up  that way    Pain Relieving Factors  tape rest                       [facility] Adult PT Treatment/Exercise - 07/20/18 0001      Shoulder Exercises: Standing   External Rotation  10 reps    Theraband Level (Shoulder External Rotation)  Level 1 (Yellow)   with roll,  painful heavy cues needed asked to not do HEP   Internal Rotation  10 reps    Theraband Level (Shoulder Internal Rotation)  Level 1 (Yellow)    Internal Rotation Limitations  cued, roll, paINFUL ON hOLD  hep      Shoulder Exercises: Pulleys   Flexion  3 minutes    cued pain free     Ultrasound   Ultrasound Location  X 5 minutes, 100%  anterior  shoulder / bicep tendon    Ultrasound Parameters  100%,  1.5 watts/cm2    Ultrasound Goals  Pain      Iontophoresis   Type of Iontophoresis  Dexamethasone    Location  shoulder anterior    Dose  1cc    Time  6 hour patch      Manual Therapy   Manual therapy comments  retrograde soft tissue to arm,  soft tissue work shoulder ,  upper quarter,  some tissue softened.,  she did not tolerate neuromuscular trigger point release to levator,  rhomboids,  light soft tissue work only.  Guarded   tape removed for ionto.     Neck Exercises: Stretches   Upper Trapezius Stretch  1 rep;10 seconds   PROM    Levator Stretch  1 rep;10 seconds   PROM            PT Education - 07/20/18  1348    Education Details  Comptroller) Educated  Patient    Methods  Explanation;Handout    Comprehension  Verbalized understanding       PT Short Term Goals - 07/17/18 1326      PT SHORT TERM GOAL #1   Title  Pt will be indepdent in her HEP.     Baseline  Not doing all her exercises    Time  3    Period  Weeks    Status  On-going      PT SHORT TERM GOAL #2   Title  Pt will be able to report pain </= 5/10 with donning her bra.     Baseline  mild, no number.  "I had a little bit of pain"    Time  3    Period  Weeks    Status  On-going        PT Long Term Goals - 06/29/18 1438      PT LONG TERM GOAL #1   Title  Pt will improve her FOTO score from 55% limitation to </= 40 % limitation.     Time  8    Period  Weeks    Status  New    Target Date  08/24/18      PT LONG TERM GOAL #2   Title  Pt will be able to improve her L UE shoulder flexion to >/= 160 degrees.     Time  8    Period  Weeks    Status  New    Target Date  08/24/18      PT LONG TERM GOAL #3   Title  Pt will be able to improve her ER to >/= 60 degrees to improve her abiltiy to fix her hair.     Time  8    Period  Weeks     Status  New    Target Date  08/24/18      PT LONG TERM GOAL #4   Title  Pt will be able to perform ADL's with pain </= 2/10.     Time  8    Period  Weeks    Status  New    Target Date  08/24/18            Plan - 07/20/18 1541    Clinical Impression Statement  Pain flare today.  She thinks she woke up from sleeping wrong.  She requires moderate cues with HEP ( may have contributed) to soreness with incorrect technique.  Trial ionto today anterior shoulder .  #1.  Tenderness/ tightness present today.  Moderate cues needed for posture correction.  No new goals met or functional changes. Pain in shoulder unchanged,  pain in arm decreased ( more centralized?)    PT Next Visit Plan  assess ionto.  Review  HEP.  I asked he to not do the bands until pain flare gone.  Continue posture ed    PT Home Exercise Plan  isometrics, cane flexion, cane ER,  return to band exercises as issued earlier  IR/ER,   row?    Consulted and Agree with Plan of Care  Patient       Patient will benefit from skilled therapeutic intervention in order to improve the following deficits and impairments:     Visit Diagnosis: Acute pain of left shoulder  Shoulder stiffness, left  Facial weakness  History of falling     Problem List Patient  Active Problem List   Diagnosis Date Noted  . AC (acromioclavicular) joint arthritis 06/20/2018  . Left rotator cuff tear 05/29/2018  . Hyperlipidemia LDL goal <70 12/19/2017  . Morbid obesity ([place]) 11/15/2016  . Type 2 diabetes mellitus with hyperglycemia, with long-term current use of insulin ([place]) 10/23/2015  . 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 Thrombophlebitis leg 02/23/2011  . TINEA CORPORIS 12/30/2008  . DEPRESSION 12/14/2006  . Essential hypertension 12/14/2006  . GERD 12/14/2006    Danean Marner PTA 07/20/2018, 5:20 PM  [facility] [address] [place], [place], [address] Phone: [phone]    Fax:  [phone]  Name: Andrea Marks MRN: [id number] Date of Birth: 1950-11-04

## 2018-07-20 NOTE — Patient Instructions (Signed)

## 2018-07-24 ENCOUNTER — Encounter: Payer: Self-pay | Admitting: Physical Therapy

## 2018-07-24 ENCOUNTER — Ambulatory Visit: Payer: Medicare Other | Admitting: Physical Therapy

## 2018-07-24 DIAGNOSIS — M25512 Pain in left shoulder: Secondary | ICD-10-CM

## 2018-07-24 DIAGNOSIS — M25612 Stiffness of left shoulder, not elsewhere classified: Secondary | ICD-10-CM

## 2018-07-24 NOTE — Patient Instructions (Signed)
Continue with HEP Except do not do the exercise with bands.

## 2018-07-24 NOTE — Therapy (Signed)
[place], [place], [address] Phone: [phone]   Fax:  [phone]  Physical Therapy Treatment  Patient Details  Name: Andrea Marks MRN: [id number] Date of Birth: [birth year] Referring Provider: Hulan Saas MD   Encounter Date: 07/24/2018  PT End of Session - 07/24/18 1841    Visit Number  5    Number of Visits  17    PT Start Time  1331    PT Stop Time  1420    PT Time Calculation (min)  49 min    Activity Tolerance  Patient tolerated treatment well    Behavior During Therapy  [facility] for tasks assessed/performed       Past Medical History:  Diagnosis Date  . Depression   . Diabetes mellitus   . GERD (gastroesophageal reflux disease)   . Hyperlipidemia   . Hypertension   . Obesity     Past Surgical History:  Procedure Laterality Date  . ABDOMINAL HYSTERECTOMY  1991   BSO    There were no vitals filed for this visit.  Subjective Assessment - 07/24/18 1337    Subjective  Elbow 4/10,  It is getting worse.  Shoulder 3-4/10.  I have had elbow pain since fell.  I did fine washing my hair with my elbow close and tilted head down.   I was sore all weekend.    has bee working of flexion,  ER on hold for now..  The patch did not help ( ionto)    Currently in Pain?  Yes    Pain Score  4     Pain Location  Shoulder    Pain Orientation  Anterior;Left    Pain Descriptors / Indicators  Sore   pulling   Pain Radiating Towards  elbow to mid arm,  sometimes up arm to shoulder     Pain Frequency  Intermittent    Aggravating Factors   leaning on elbows.   moving elbow,  shooting    Pain Relieving Factors  arm resting across stomack while holding elbow in other hand,  ice helps for the moment.     Effect of Pain on Daily Activities  modifies ADL,  uses right hand more for driving,  holds steering wheel lower.    Multiple Pain Sites  --   elbow 4/10,  worse since the fall.  has had whole time.    shooting,  throbbing        [facility] PT Assessment - 07/24/18 0001      AROM   Left Shoulder Flexion  130 Degrees    Left Shoulder ABduction  85 Degrees   limited by pain   Left Shoulder External Rotation  40 Degrees                   [facility] Adult PT Treatment/Exercise - 07/24/18 0001      Shoulder Exercises: Supine   Protraction  AAROM   cane   Horizontal ABduction  10 reps;AAROM    External Rotation  10 reps;AAROM   cane   Flexion  10 reps;AAROM   cane,  cued pain free all cane   Other Supine Exercises  scapular retraction 10 x moderate cues initially ,  guarding      Shoulder Exercises: Isometric Strengthening   Extension  5X5"    External Rotation Limitations  5 x 5    Internal Rotation Limitations  5 X 5    ABduction  5X5"  Manual Therapy   Manual Therapy  Taping    Manual therapy comments  soft tissue work left elbow shoulder,  .  Neck not hurting today.     Passive ROM  flexion , ER,  abduction,  gentle.  pain intermittant and unpredictable. PROM to shoulser was able to decrease elbow pain     Kinesiotex  Inhibit Muscle      Kinesiotix   Inhibit Muscle   deltiod and supraspinatus, this was also done last treatment.   also create space lateral elbow            PT Education - 07/24/18 1841    Education Details  Exercise form,  posture importance    Person(s) Educated  Patient    Methods  Explanation;Demonstration;Verbal cues;Tactile cues    Comprehension  Verbalized understanding;Returned demonstration       PT Short Term Goals - 07/24/18 1846      PT SHORT TERM GOAL #1   Title  Pt will be indepdent in her HEP.     Baseline  independent with exercises so far    Time  3    Period  Weeks    Status  On-going      PT SHORT TERM GOAL #2   Title  Pt will be able to report pain </= 5/10 with donning her bra.     Time  3    Period  Weeks    Status  Unable to assess        PT Long Term Goals - 06/29/18 1438      PT LONG TERM GOAL  #1   Title  Pt will improve her FOTO score from 55% limitation to </= 40 % limitation.     Time  8    Period  Weeks    Status  New    Target Date  08/24/18      PT LONG TERM GOAL #2   Title  Pt will be able to improve her L UE shoulder flexion to >/= 160 degrees.     Time  8    Period  Weeks    Status  New    Target Date  08/24/18      PT LONG TERM GOAL #3   Title  Pt will be able to improve her ER to >/= 60 degrees to improve her abiltiy to fix her hair.     Time  8    Period  Weeks    Status  New    Target Date  08/24/18      PT LONG TERM GOAL #4   Title  Pt will be able to perform ADL's with pain </= 2/10.     Time  8    Period  Weeks    Status  New    Target Date  08/24/18            Plan - 07/24/18 1842    Clinical Impression Statement  Patient has pain flare today in elbow initially she had no shoulder pain.  ROM slightly improver with ER and Abduction decreased. Flexion unchanged.  Exercises have been scaled back since last visit  due to pain with incorrect technique.  PT  is out of town next week.  No new goals met.    PT Next Visit Plan    Consider Isometrics for HEP  ( extension/ abduction?) if she tolerated them today.  I asked he to not do the bands until pain flare gone.  Continue posture ed    PT Home Exercise Plan  isometrics, cane flexion, cane ER,      Consulted and Agree with Plan of Care  Patient       Patient will benefit from skilled therapeutic intervention in order to improve the following deficits and impairments:     Visit Diagnosis: Acute pain of left shoulder  Shoulder stiffness, left     Problem List Patient Active Problem List   Diagnosis Date Noted  . AC (acromioclavicular) joint arthritis 06/20/2018  . Left rotator cuff tear 05/29/2018  . Hyperlipidemia LDL goal <70 12/19/2017  . Morbid obesity ([place]) 11/15/2016  . Type 2 diabetes mellitus with hyperglycemia, with long-term current use of insulin ([place]) 10/23/2015  .  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 Thrombophlebitis leg 02/23/2011  . TINEA CORPORIS 12/30/2008  . DEPRESSION 12/14/2006  . Essential hypertension 12/14/2006  . GERD 12/14/2006    Yang Rack PTA 07/24/2018, 6:48 PM  [facility] [address] [place], [place], [address] Phone: [phone]   Fax:  [phone]  Name: Andrea Marks MRN: [id number] Date of Birth: [birth year]

## 2018-07-27 ENCOUNTER — Ambulatory Visit: Payer: Medicare Other | Admitting: Physical Therapy

## 2018-08-06 ENCOUNTER — Other Ambulatory Visit: Payer: Self-pay | Admitting: Family Medicine

## 2018-08-08 ENCOUNTER — Encounter: Payer: Self-pay | Admitting: Physical Therapy

## 2018-08-08 ENCOUNTER — Ambulatory Visit: Payer: Medicare Other | Attending: Family Medicine | Admitting: Physical Therapy

## 2018-08-08 DIAGNOSIS — M25512 Pain in left shoulder: Secondary | ICD-10-CM | POA: Diagnosis present

## 2018-08-08 DIAGNOSIS — M25612 Stiffness of left shoulder, not elsewhere classified: Secondary | ICD-10-CM

## 2018-08-08 DIAGNOSIS — R29810 Facial weakness: Secondary | ICD-10-CM

## 2018-08-08 DIAGNOSIS — Z9181 History of falling: Secondary | ICD-10-CM

## 2018-08-08 NOTE — Patient Instructions (Signed)
WALL SLIDE  Stand facing wall with towel Slide towel up wall for a gentle stretch 10 X  With both hands Lower slowly and repeat  10 X to 20 X daily

## 2018-08-08 NOTE — Therapy (Signed)
[place], [place], [address] Phone: [phone]   Fax:  [phone]  Physical Therapy Treatment  Patient Details  Name: Andrea Marks MRN: [id number] Date of Birth: 05-May-1951 Referring Provider (PT): Hulan Saas MD   Encounter Date: 08/08/2018  PT End of Session - 08/08/18 1241    Visit Number  6    Number of Visits  17    PT Start Time  3810    PT Stop Time  1230    PT Time Calculation (min)  42 min    Activity Tolerance  Patient tolerated treatment well    Behavior During Therapy  [facility] for tasks assessed/performed       Past Medical History:  Diagnosis Date  . Depression   . Diabetes mellitus   . GERD (gastroesophageal reflux disease)   . Hyperlipidemia   . Hypertension   . Obesity     Past Surgical History:  Procedure Laterality Date  . ABDOMINAL HYSTERECTOMY  1991   BSO    There were no vitals filed for this visit.  Subjective Assessment - 08/08/18 1148    Subjective  I have been to the beach   I was tense with driving   [birth year] . I did some of the exercises.  I brought the ice pack to use.      Currently in Pain?  Yes    Pain Score  4     Pain Location  Shoulder    Pain Orientation  Left;Anterior    Pain Descriptors / Indicators  Throbbing    Pain Radiating Towards  to neck,  slept crooked in hotel,  ( uses hospital bed at home)  Into deltoid.     Pain Frequency  Intermittent    Aggravating Factors   tense driving,  reaching ,  opening doors of fridge.  zpacking/ unpacking    Pain Relieving Factors  wears a elbow slide on support with 2 velcro straps  and it helped elbow pain.  sometimes wears to bed.  ice.          [facility] PT Assessment - 08/08/18 0001      AROM   Left Shoulder Flexion  145 Degrees    Left Shoulder ABduction  138 Degrees   mild scaption   Left Shoulder External Rotation  31 Degrees   47 after exercise.                  [facility] Adult PT  Treatment/Exercise - 08/08/18 0001      Shoulder Exercises: Seated   Row  10 reps;Left    Theraband Level (Shoulder Row)  Level 1 (Yellow)    Row Limitations  to neutral vs shoulder extension due to pain      Shoulder Exercises: Standing   Flexion  Both;10 reps   with towel on wall   Flexion Limitations  ALSO  7 X reach to 2 shelves 7 X limited  by fatigue       Shoulder Exercises: Pulleys   Flexion  3 minutes      Shoulder Exercises: Isometric Strengthening   Extension  5X5"    External Rotation Limitations  --   1 X stopped due to pain  3-4/10   Internal Rotation Limitations  5 X 5    ABduction  5X5"      Manual Therapy   Manual Therapy  Taping      Kinesiotix   Inhibit Muscle   supraspinatus  left      Neck Exercises: Stretches   Upper Trapezius Stretch  1 rep;10 seconds             PT Education - 08/08/18 1240    Education Details  HEP    Person(s) Educated  Patient    Methods  Explanation;Demonstration;Verbal cues;Handout    Comprehension  Verbalized understanding;Returned demonstration       PT Short Term Goals - 07/24/18 1846      PT SHORT TERM GOAL #1   Title  Pt will be indepdent in her HEP.     Baseline  independent with exercises so far    Time  3    Period  Weeks    Status  On-going      PT SHORT TERM GOAL #2   Title  Pt will be able to report pain </= 5/10 with donning her bra.     Time  3    Period  Weeks    Status  Unable to assess        PT Long Term Goals - 08/08/18 1157      PT LONG TERM GOAL #1   Title  Pt will improve her FOTO score from 55% limitation to </= 40 % limitation.     Time  8    Period  Weeks    Status  Unable to assess      PT LONG TERM GOAL #2   Title  Pt will be able to improve her L UE shoulder flexion to >/= 160 degrees.     Baseline  145    Time  8    Period  Weeks    Status  On-going      PT LONG TERM GOAL #3   Title  Pt will be able to improve her ER to >/= 60 degrees to improve her abiltiy to fix  her hair.     Baseline  31 prior to exercise.  It increased with exercise,  see flow sheet.    Time  8    Period  Weeks    Status  On-going      PT LONG TERM GOAL #4   Title  Pt will be able to perform ADL's with pain </= 2/10.     Baseline  4/10    Time  8    Period  Weeks    Status  On-going            Plan - 08/08/18 1245    Clinical Impression Statement  patient has been on vacation.  AROM improving 145 flexion, abduction 138 with some scaption and ER 31 .  Measurements taken prior to exercise.  She has been doing some of the exercises while on vacation.  Pain at end of session 3-4/10.  Elbow pain  not presest today.  patient has been wearing a sleeve to help.  Patient easily gets pain flares so exercise progression has been ajusted .  STG#1 met.  Patient has no pain donning bra due to hooking it in the front,  She has 5-6/10 pain with reaching behind.  STG#2 partially met. Patient fatigues quickly with exercise.    PT Next Visit Plan  needs to see a PT at least 1 x a month.  Progress exercises as able    PT Home Exercise Plan  isometrics, cane flexion, cane ER,  wall slides with towel.       Patient will benefit from skilled therapeutic intervention in order  to improve the following deficits and impairments:     Visit Diagnosis: Acute pain of left shoulder  Shoulder stiffness, left  Facial weakness  History of falling     Problem List Patient Active Problem List   Diagnosis Date Noted  . AC (acromioclavicular) joint arthritis 06/20/2018  . Left rotator cuff tear 05/29/2018  . Hyperlipidemia LDL goal <70 12/19/2017  . Morbid obesity ([place]) 11/15/2016  . Type 2 diabetes mellitus with hyperglycemia, with long-term current use of insulin ([place]) 10/23/2015  . 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 Thrombophlebitis leg 02/23/2011  . TINEA CORPORIS 12/30/2008  . DEPRESSION 12/14/2006  . Essential hypertension 12/14/2006  . GERD 12/14/2006    Celine Dishman  PTA 08/08/2018, 12:55 PM  [facility] [address] [place], [place], [address] Phone: [phone]   Fax:  [phone]  Name: Mariadejesus Cade MRN: [id number] Date of Birth: 22-Oct-1951

## 2018-08-10 ENCOUNTER — Ambulatory Visit: Payer: Medicare Other | Admitting: Physical Therapy

## 2018-08-10 ENCOUNTER — Encounter: Payer: Self-pay | Admitting: Physical Therapy

## 2018-08-10 ENCOUNTER — Other Ambulatory Visit: Payer: Self-pay | Admitting: Family Medicine

## 2018-08-10 DIAGNOSIS — M25612 Stiffness of left shoulder, not elsewhere classified: Secondary | ICD-10-CM

## 2018-08-10 DIAGNOSIS — M25512 Pain in left shoulder: Secondary | ICD-10-CM | POA: Diagnosis not present

## 2018-08-10 NOTE — Therapy (Signed)
[facility] [address] [place], [place], [address] Phone: [phone]   Fax:  [phone]  Physical Therapy Treatment Progress Note Reporting Period 06/29/2018 to 08/10/2018  See note below for Objective Data and Assessment of Progress/Goals.      Patient Details  Name: Andrea Marks MRN: [id number] Date of Birth: [birth year] Referring Provider (PT): Antoine Primas MD   Encounter Date: 08/10/2018  PT End of Session - 08/10/18 1335    Visit Number  7    Number of Visits  17    Authorization Type  UHC  Medicare    PT Start Time  1334    PT Stop Time  1415    PT Time Calculation (min)  41 min    Activity Tolerance  Patient tolerated treatment well    Behavior During Therapy  [facility] for tasks assessed/performed       Past Medical History:  Diagnosis Date  . Depression   . Diabetes mellitus   . GERD (gastroesophageal reflux disease)   . Hyperlipidemia   . Hypertension   . Obesity     Past Surgical History:  Procedure Laterality Date  . ABDOMINAL HYSTERECTOMY  1991   BSO    There were no vitals filed for this visit.  Subjective Assessment - 08/10/18 1336    Subjective  I have been at the beach last week so I think I did too much. This morning I reached back and have a burning feeling now. Denies N/t in hand, pain in shoulder to elbow. Burning had resolved since beginning PT but got a sharp pain when reaching back today and now has burning.     Patient Stated Goals  Stop hurting, get back to things I could do before my fall    Currently in Pain?  Yes    Pain Score  6     Pain Location  Shoulder    Pain Orientation  Left    Pain Descriptors / Indicators  Burning    Aggravating Factors   reaching back    Pain Relieving Factors  rest, ice         [facility] PT Assessment - 08/10/18 0001      AROM   Left Shoulder Flexion  137 Degrees    Left Shoulder ABduction  120 Degrees      Palpation   Palpation comment   concordant burning upon palpation to infraspinatus tendond and supraspinatus                   [facility] Adult PT Treatment/Exercise - 08/10/18 0001      Exercises   Exercises  Shoulder      Shoulder Exercises: Seated   Flexion  AAROM    Flexion Limitations  with wand    Other Seated Exercises  scap retraction x15    Other Seated Exercises  resisted triceps ext      Shoulder Exercises: Pulleys   Flexion  3 minutes             PT Education - 08/10/18 1419    Education Details  goals, FOTO, progress & stepbacks, POC, HEP, purse, anatomy of condition    Person(s) Educated  Patient    Methods  Explanation;Demonstration;Tactile cues;Verbal cues    Comprehension  Verbalized understanding;Returned demonstration;Verbal cues required;Tactile cues required;Need further instruction       PT Short Term Goals - 08/10/18 1340      PT SHORT TERM GOAL #1   Title  Pt will be indepdent in her HEP.     Baseline  independent at short term    Status  Achieved      PT SHORT TERM GOAL #2   Title  Pt will be able to report pain </= 5/10 with donning her bra.     Baseline  fine to don bra, doffing is the hard     Status  Achieved        PT Long Term Goals - 08/10/18 1355      PT LONG TERM GOAL #1   Title  Pt will improve her FOTO score from 55% limitation to </= 40 % limitation.     Baseline  45% limitation    Status  On-going    Target Date  08/24/18      PT LONG TERM GOAL #2   Title  Pt will be able to improve her L UE shoulder flexion to >/= 160 degrees.     Baseline  see flowsheet    Time  8    Period  Weeks    Status  On-going      PT LONG TERM GOAL #3   Title  Pt will be able to improve her ER to >/= 60 degrees to improve her abiltiy to fix her hair.     Baseline  approx 45 visual today, increased pain after injury today    Time  8    Period  Weeks    Status  On-going      PT LONG TERM GOAL #4   Title  Pt will be able to perform ADL's with pain </= 2/10.      Baseline  6/10 today- increased pain today after injury    Time  8    Period  Weeks    Status  On-going            Plan - 08/10/18 1358    Clinical Impression Statement  Impingement with reaching behind head this morning resulting in tenderness at [employer] ROM measurements went down a little but not worrisome. Asked pt to progress supine HEP to a seated position and looked online at purchasing pulleys- "they are my favorite exercise"   will continue to benefit from skilled PT to improve functional movement with known RC tear.     PT Treatment/Interventions  ADLs/Self Care Home Management;Cryotherapy;Electrical Stimulation;Iontophoresis 4mg /ml Dexamethasone;Moist Heat;Ultrasound;Therapeutic exercise;Therapeutic activities;Functional mobility training;Patient/family education;Manual techniques;Passive range of motion;Taping    PT Next Visit Plan  continue to progress exercises to upright position, triceps activation    PT Home Exercise Plan  isometrics, cane flexion, cane ER,  wall slides with towel.    Consulted and Agree with Plan of Care  Patient       Patient will benefit from skilled therapeutic intervention in order to improve the following deficits and impairments:  Pain, Postural dysfunction, Decreased activity tolerance, Decreased range of motion, Decreased strength, Impaired UE functional use, Obesity  Visit Diagnosis: Acute pain of left shoulder  Shoulder stiffness, left     Problem List Patient Active Problem List   Diagnosis Date Noted  . AC (acromioclavicular) joint arthritis 06/20/2018  . Left rotator cuff tear 05/29/2018  . Hyperlipidemia LDL goal <70 12/19/2017  . Morbid obesity (HCC) 11/15/2016  . Type 2 diabetes mellitus with hyperglycemia, with long-term current use of insulin (HCC) 10/23/2015  . 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 Thrombophlebitis leg 02/23/2011  . TINEA CORPORIS 12/30/2008  . DEPRESSION 12/14/2006  . Essential hypertension  12/14/2006  . GERD 12/14/2006   Serai Tukes C. Sujay Grundman PT, DPT 08/10/18 2:21 PM   [facility] [address] [place], [place], [address] Phone: [phone]   Fax:  [phone]  Name: Andrea Marks MRN: [id number] Date of Birth: 1951-06-06

## 2018-08-10 NOTE — Telephone Encounter (Signed)
Zantac currently on recall.

## 2018-08-13 NOTE — Telephone Encounter (Signed)
Can switch to pepcid otc or we can rx omeprazole 40 mg 1 po qd #90 3 refills

## 2018-08-14 ENCOUNTER — Encounter: Payer: Self-pay | Admitting: Physical Therapy

## 2018-08-14 ENCOUNTER — Ambulatory Visit: Payer: Medicare Other | Admitting: Physical Therapy

## 2018-08-14 DIAGNOSIS — M25612 Stiffness of left shoulder, not elsewhere classified: Secondary | ICD-10-CM

## 2018-08-14 DIAGNOSIS — M25512 Pain in left shoulder: Secondary | ICD-10-CM

## 2018-08-14 NOTE — Therapy (Signed)
[facility] [address] [place], [place], [address] Phone: [phone]   Fax:  [phone]  Physical Therapy Treatment  Patient Details  Name: Andrea Marks MRN: [id number] Date of Birth: September 05, 1951 Referring Provider (PT): Antoine Primas MD   Encounter Date: 08/14/2018  PT End of Session - 08/14/18 1254    Visit Number  8    Number of Visits  17    Authorization Type  UHC  Medicare    PT Start Time  1140    PT Stop Time  1225    PT Time Calculation (min)  45 min    Activity Tolerance  Patient tolerated treatment well    Behavior During Therapy  [facility] for tasks assessed/performed       Past Medical History:  Diagnosis Date  . Depression   . Diabetes mellitus   . GERD (gastroesophageal reflux disease)   . Hyperlipidemia   . Hypertension   . Obesity     Past Surgical History:  Procedure Laterality Date  . ABDOMINAL HYSTERECTOMY  1991   BSO    There were no vitals filed for this visit.  Subjective Assessment - 08/14/18 1147    Subjective  pain 4/10 in shoulder.  Also has sinus pain.  I got the pulleys.  I have not tried them yet.  I did a little sitting exercises.  i did good with hairdryer with reaching up  more.     Currently in Pain?  Yes    Pain Score  4     Pain Location  Shoulder    Pain Orientation  Left    Pain Descriptors / Indicators  Aching    Pain Type  Acute pain    Pain Radiating Towards  whole arm aches.  into neck with exercise    Aggravating Factors   exercise,  sleeping on shoulder,  reaching back     Pain Relieving Factors  ice    Effect of Pain on Daily Activities  modifies reaching as needed.                        [facility] Adult PT Treatment/Exercise - 08/14/18 0001      Shoulder Exercises: Seated   Extension  10 reps;Left    Theraband Level (Shoulder Extension)  Level 1 (Yellow)    Extension Limitations  to almost neutral to avoid pain   mod cues initially   Row   10 reps    Theraband Level (Shoulder Row)  Level 1 (Yellow)    Row Limitations  to neutral vs shoulder extension due to pain    Protraction  10 reps;Left    Theraband Level (Shoulder Protraction)  Level 1 (Yellow)    Protraction Limitations  cued to punch toward floor,  painfree    External Rotation  AAROM    External Rotation Limitations  with wand 10 x,  cues given for all wand exercisesfor technique and painfree    Flexion  AAROM   10,  cues initially   Flexion Limitations  with wand   AROM painful with lowering  1 X120 degrees   Abduction  10 reps    ABduction Limitations  with wand    Other Seated Exercises  scap retraction 10 x    Other Seated Exercises  resisted triceps ext   green band     Shoulder Exercises: Standing   External Rotation  10 reps      Shoulder Exercises:  Pulleys   Flexion  3 minutes      Manual Therapy   Manual therapy comments  soft tissue work biceps deltoud. upper trap  peri scapular.  no burning today with soft tissue.              PT Education - 08/14/18 1253    Education Details  exercise form.  OK to return to seated row to neutral    Person(s) Educated  Patient    Methods  Explanation;Demonstration;Tactile cues;Verbal cues   already has handout from previous   Comprehension  Verbalized understanding;Returned demonstration       PT Short Term Goals - 08/10/18 1340      PT SHORT TERM GOAL #1   Title  Pt will be indepdent in her HEP.     Baseline  independent at short term    Status  Achieved      PT SHORT TERM GOAL #2   Title  Pt will be able to report pain </= 5/10 with donning her bra.     Baseline  fine to don bra, doffing is the hard     Status  Achieved        PT Long Term Goals - 08/14/18 1258      PT LONG TERM GOAL #1   Title  Pt will improve her FOTO score from 55% limitation to </= 40 % limitation.     Time  8    Period  Weeks    Status  Unable to assess      PT LONG TERM GOAL #2   Title  Pt will be able to  improve her L UE shoulder flexion to >/= 160 degrees.     Baseline  120    Time  8    Period  Weeks    Status  On-going      PT LONG TERM GOAL #3   Title  Pt will be able to improve her ER to >/= 60 degrees to improve her abiltiy to fix her hair.     Baseline  approx 45 visual today    Time  8    Period  Weeks    Status  On-going      PT LONG TERM GOAL #4   Title  Pt will be able to perform ADL's with pain </= 2/10.     Baseline  6/10 today- increased pain today after injury    Time  8    Period  Weeks    Status  On-going            Plan - 08/14/18 1255    Clinical Impression Statement  ROM about the same as last visit.  She has inconsistant  pain in shoulder with lowering arm after flexion.  It is better when she gives support/light resistance for lowering.   She was able to return to row, yellow  band, for home.     PT Next Visit Plan  continue to progress exercises to upright position, triceps activation,  see how row is going    PT Home Exercise Plan  isometrics, cane flexion, cane ER,  wall slides with towel.  row yellow band    Consulted and Agree with Plan of Care  Patient       Patient will benefit from skilled therapeutic intervention in order to improve the following deficits and impairments:     Visit Diagnosis: Acute pain of left shoulder  Shoulder stiffness, left  Problem List Patient Active Problem List   Diagnosis Date Noted  . AC (acromioclavicular) joint arthritis 06/20/2018  . Left rotator cuff tear 05/29/2018  . Hyperlipidemia LDL goal <70 12/19/2017  . Morbid obesity (HCC) 11/15/2016  . Type 2 diabetes mellitus with hyperglycemia, with long-term current use of insulin (HCC) 10/23/2015  . 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 Thrombophlebitis leg 02/23/2011  . TINEA CORPORIS 12/30/2008  . DEPRESSION 12/14/2006  . Essential hypertension 12/14/2006  . GERD 12/14/2006    [name]  PTA 08/14/2018, 1:01 PM  [facility] [address] [place], [place], [address] Phone: [phone]   Fax:  [phone]  Name: Andrea Marks MRN: [id number] Date of Birth: 1951/06/04

## 2018-08-17 ENCOUNTER — Ambulatory Visit: Payer: Medicare Other | Admitting: Physical Therapy

## 2018-08-21 ENCOUNTER — Encounter: Payer: Self-pay | Admitting: Physical Therapy

## 2018-08-21 ENCOUNTER — Ambulatory Visit: Payer: Medicare Other | Admitting: Physical Therapy

## 2018-08-21 DIAGNOSIS — M25612 Stiffness of left shoulder, not elsewhere classified: Secondary | ICD-10-CM

## 2018-08-21 DIAGNOSIS — M25512 Pain in left shoulder: Secondary | ICD-10-CM

## 2018-08-21 NOTE — Therapy (Signed)
[facility] [address] [place], [place], [address] Phone: [phone]   Fax:  [phone]  Physical Therapy Treatment  Patient Details  Name: Andrea Marks MRN: [id number] Date of Birth: August 17, 1951 Referring Provider (PT): Antoine Primas MD   Encounter Date: 08/21/2018  PT End of Session - 08/21/18 1254    Visit Number  9    Number of Visits  17    Authorization Type  UHC  Medicare    PT Start Time  1147    PT Stop Time  1230    PT Time Calculation (min)  43 min    Activity Tolerance  Patient tolerated treatment well    Behavior During Therapy  [facility] for tasks assessed/performed       Past Medical History:  Diagnosis Date  . Depression   . Diabetes mellitus   . GERD (gastroesophageal reflux disease)   . Hyperlipidemia   . Hypertension   . Obesity     Past Surgical History:  Procedure Laterality Date  . ABDOMINAL HYSTERECTOMY  1991   BSO    There were no vitals filed for this visit.  Subjective Assessment - 08/21/18 1149    Subjective  2-3/10  pain.  i missed last week due to stomach issues..  My shoulder feels better since I rested.    Able to lift  elbow a little higher to do hair.  Fewer spasms.   nOT GOING INTO NECK     Currently in Pain?  Yes    Pain Score  3     Pain Location  Shoulder    Pain Orientation  Left    Pain Descriptors / Indicators  Aching;Throbbing    Aggravating Factors   sleeping on shoulder,  reaching back,  leaning on elbow.  ( improving)    Pain Relieving Factors  rest  ice         [facility] PT Assessment - 08/21/18 0001      AROM   Left Shoulder Extension  30 Degrees    Left Shoulder Flexion  130 Degrees   pain with lowering 4/10   Left Shoulder ABduction  130 Degrees   pain with lowering.                   [facility] Adult PT Treatment/Exercise - 08/21/18 0001      Shoulder Exercises: Supine   Flexion  Left;10 reps    Shoulder Flexion Weight (lbs)  1    Flexion  Limitations  shoulder height 1 lb  JUST OVER eye height 10 X 0 LBS      Shoulder Exercises: Prone   Retraction  10 reps    Retraction Limitations  leNING OVER COUNTER,  needed max cues  so stopped.       Shoulder Exercises: Standing   Internal Rotation  10 reps;AAROM   cane behind back   ABduction  15 reps    ABduction Limitations  towel slide on wall    Extension  10 reps;AROM   cane 2 steps no pain   Row  20 reps    Theraband Level (Shoulder Row)  Level 1 (Yellow)    Retraction  10 reps    Other Standing Exercises  WALL WIPES 20 x      Shoulder Exercises: Pulleys   Flexion  3 minutes      Manual Therapy   Manual therapy comments  soft tissue work left shoulder girdle ,  tissue softened ,  decreased pain.               PT Education - 08/21/18 1254    Education Details  exercise form    Person(s) Educated  Patient    Methods  Explanation;Demonstration;Verbal cues;Tactile cues    Comprehension  Verbalized understanding;Returned demonstration       PT Short Term Goals - 08/10/18 1340      PT SHORT TERM GOAL #1   Title  Pt will be indepdent in her HEP.     Baseline  independent at short term    Status  Achieved      PT SHORT TERM GOAL #2   Title  Pt will be able to report pain </= 5/10 with donning her bra.     Baseline  fine to don bra, doffing is the hard     Status  Achieved        PT Long Term Goals - 08/21/18 1257      PT LONG TERM GOAL #1   Title  Pt will improve her FOTO score from 55% limitation to </= 40 % limitation.     Time  8    Period  Weeks    Status  Unable to assess      PT LONG TERM GOAL #2   Title  Pt will be able to improve her L UE shoulder flexion to >/= 160 degrees.     Baseline  120    Time  8    Period  Weeks    Status  On-going      PT LONG TERM GOAL #3   Title  Pt will be able to improve her ER to >/= 60 degrees to improve her abiltiy to fix her hair.     Time  8    Period  Weeks    Status  Unable to assess      PT  LONG TERM GOAL #4   Title  Pt will be able to perform ADL's with pain </= 2/10.     Baseline  has been in bed last few days due to sickness    Time  8    Period  Weeks    Status  On-going            Plan - 08/21/18 1255    Clinical Impression Statement  Patient was able to extend shoulder 30 degrees without pain,  pain continues with lowering arm after flexion ( inconsistantly)  Soft tissue helpful with shoulder pain tension.     PT Next Visit Plan  One mor visit  FOTO check all goals.     PT Home Exercise Plan  isometrics, cane flexion, cane ER,  wall slides with towel.  row yellow band       Patient will benefit from skilled therapeutic intervention in order to improve the following deficits and impairments:     Visit Diagnosis: Acute pain of left shoulder  Shoulder stiffness, left     Problem List Patient Active Problem List   Diagnosis Date Noted  . AC (acromioclavicular) joint arthritis 06/20/2018  . Left rotator cuff tear 05/29/2018  . Hyperlipidemia LDL goal <70 12/19/2017  . Morbid obesity (HCC) 11/15/2016  . Type 2 diabetes mellitus with hyperglycemia, with long-term current use of insulin (HCC) 10/23/2015  . 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 Thrombophlebitis leg 02/23/2011  . TINEA CORPORIS 12/30/2008  . DEPRESSION 12/14/2006  . Essential hypertension 12/14/2006  . GERD 12/14/2006    Andrea Marks  PTA 08/21/2018, 12:59 PM  [facility] [address] [place], [place], [address] Phone: [phone]   Fax:  [phone]  Name: Andrea Marks MRN: [id number] Date of Birth: 09-29-1951

## 2018-08-22 ENCOUNTER — Other Ambulatory Visit: Payer: Self-pay | Admitting: Family Medicine

## 2018-08-22 DIAGNOSIS — E785 Hyperlipidemia, unspecified: Secondary | ICD-10-CM

## 2018-08-24 ENCOUNTER — Ambulatory Visit: Payer: Medicare Other | Admitting: Physical Therapy

## 2018-08-24 ENCOUNTER — Encounter: Payer: Self-pay | Admitting: Physical Therapy

## 2018-08-24 DIAGNOSIS — M25512 Pain in left shoulder: Secondary | ICD-10-CM

## 2018-08-24 DIAGNOSIS — M25612 Stiffness of left shoulder, not elsewhere classified: Secondary | ICD-10-CM

## 2018-08-24 NOTE — Therapy (Signed)
[place], [place], [address] Phone: [phone]   Fax:  [phone]  Physical Therapy Treatment/Discharge  Patient Details  Name: Andrea Marks MRN: [id number] Date of Birth: Jun 01, 1951 Referring Provider (PT): Hulan Saas MD   Encounter Date: 08/24/2018  PT End of Session - 08/24/18 1147    Visit Number  10    Number of Visits  17    Authorization Type  UHC  Medicare    PT Start Time  4196    PT Stop Time  1228    PT Time Calculation (min)  44 min    Activity Tolerance  Patient tolerated treatment well    Behavior During Therapy  [facility] for tasks assessed/performed       Past Medical History:  Diagnosis Date  . Depression   . Diabetes mellitus   . GERD (gastroesophageal reflux disease)   . Hyperlipidemia   . Hypertension   . Obesity     Past Surgical History:  Procedure Laterality Date  . ABDOMINAL HYSTERECTOMY  1991   BSO    There were no vitals filed for this visit.  Subjective Assessment - 08/24/18 1230    Subjective  about a 2-3/10 on average and am able to do what I want to do. My husband helps me lift heavy pots/pans from oven and stove. I slept on it last night so it is sore this morning.     Patient Stated Goals  Stop hurting, get back to things I could do before my fall    Currently in Pain?  Yes    Pain Score  --   2 or 3   Pain Location  Shoulder    Pain Orientation  Left    Pain Descriptors / Indicators  Sore    Aggravating Factors   sleeping on shoulder, lifting heavy items    Pain Relieving Factors  ice, pulleys         [facility] PT Assessment - 08/24/18 0001      Assessment   Medical Diagnosis  left rotator cuff tear, pain    Referring Provider (PT)  Hulan Saas MD    Onset Date/Surgical Date  04/21/18    Hand Dominance  Right    Next MD Visit  10/30      Observation/Other Assessments   Focus on Therapeutic Outcomes (FOTO)   35% limited      AROM   Left Shoulder Extension  30 Degrees    Left Shoulder Flexion  130 Degrees    Left Shoulder ABduction  130 Degrees      Palpation   Palpation comment  denies TTP                   [facility] Adult PT Treatment/Exercise - 08/24/18 0001      Shoulder Exercises: Seated   Row  Both    Theraband Level (Shoulder Row)  Level 1 (Yellow)    Protraction  Both    Theraband Level (Shoulder Protraction)  Level 1 (Yellow)    Protraction Limitations  band wrapped behind back    Flexion Limitations  AAROM with wand- graded AAROM in lowering    Other Seated Exercises  seatbelt movement with wand    Other Seated Exercises  scap retraction, resisted triceps ext      Shoulder Exercises: Standing   Other Standing Exercises  wall slides      Shoulder Exercises: Pulleys   Flexion  3 minutes  PT Education - 08/24/18 1231    Education Details  goals discussion, FOTO, HEP, resting as needed    Person(s) Educated  Patient    Methods  Explanation    Comprehension  Verbalized understanding       PT Short Term Goals - 08/10/18 1340      PT SHORT TERM GOAL #1   Title  Pt will be indepdent in her HEP.     Baseline  independent at short term    Status  Achieved      PT SHORT TERM GOAL #2   Title  Pt will be able to report pain </= 5/10 with donning her bra.     Baseline  fine to don bra, doffing is the hard     Status  Achieved        PT Long Term Goals - 08/24/18 1152      PT LONG TERM GOAL #1   Title  Pt will improve her FOTO score from 55% limitation to </= 40 % limitation.     Baseline  35% limited    Status  Achieved      PT LONG TERM GOAL #2   Title  Pt will be able to improve her L UE shoulder flexion to >/= 160 degrees.     Baseline  120    Status  Not Met      PT LONG TERM GOAL #3   Title  Pt will be able to improve her ER to >/= 60 degrees to improve her abiltiy to fix her hair.     Baseline  able to use hair tools and perform necessary ROM    Status   Achieved      PT LONG TERM GOAL #4   Title  Pt will be able to perform ADL's with pain </= 2/10.     Baseline  between 2 and 3 on avg    Status  Partially Met            Plan - 08/24/18 1232    Clinical Impression Statement  Pt has made significant improvements since beginning PT and is ready for d/c to independent program. Strength and ROM are [facility] and pt is able to complete tasks necessary for ADLs. Was encouraged to contact us with any further questions.     PT Treatment/Interventions  ADLs/Self Care Home Management;Cryotherapy;Electrical Stimulation;Iontophoresis '4mg'$ /ml Dexamethasone;Moist Heat;Ultrasound;Therapeutic exercise;Therapeutic activities;Functional mobility training;Patient/family education;Manual techniques;Passive range of motion;Taping    Consulted and Agree with Plan of Care  Patient       Patient will benefit from skilled therapeutic intervention in order to improve the following deficits and impairments:  Pain, Postural dysfunction, Decreased activity tolerance, Decreased range of motion, Decreased strength, Impaired UE functional use, Obesity  Visit Diagnosis: Acute pain of left shoulder  Shoulder stiffness, left     Problem List Patient Active Problem List   Diagnosis Date Noted  . AC (acromioclavicular) joint arthritis 06/20/2018  . Left rotator cuff tear 05/29/2018  . Hyperlipidemia LDL goal <70 12/19/2017  . Morbid obesity ([place]) 11/15/2016  . Type 2 diabetes mellitus with hyperglycemia, with long-term current use of insulin ([place]) 10/23/2015  . 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 Thrombophlebitis leg 02/23/2011  . TINEA CORPORIS 12/30/2008  . DEPRESSION 12/14/2006  . Essential hypertension 12/14/2006  . GERD 12/14/2006   PHYSICAL THERAPY DISCHARGE SUMMARY  Visits from Start of Care: 10  Current functional level related to goals / functional outcomes: See above   Remaining  deficits: See above   Education / Equipment: Anatomy of  condition, POC, HEP, exercise form/rationale Plan: Patient agrees to discharge.  Patient goals were met. Patient is being discharged due to meeting the stated rehab goals.  ?????     Cachet Mccutchen C. Kennidee Heyne PT, DPT 08/24/18 12:34 PM   [facility] [address] [place], [place], [address] Phone: [phone]   Fax:  [phone]  Name: Jilliam Bellmore MRN: [id number] Date of Birth: Aug 01, 1951

## 2018-08-27 ENCOUNTER — Encounter: Payer: Self-pay | Admitting: Family Medicine

## 2018-08-27 DIAGNOSIS — E785 Hyperlipidemia, unspecified: Secondary | ICD-10-CM

## 2018-08-28 MED ORDER — EZETIMIBE-SIMVASTATIN 10-40 MG PO TABS: 1.0000 | ORAL_TABLET | Freq: Every day | ORAL | 0 refills | Status: DC

## 2018-08-29 ENCOUNTER — Encounter: Payer: Self-pay | Admitting: Podiatry

## 2018-08-29 ENCOUNTER — Ambulatory Visit (INDEPENDENT_AMBULATORY_CARE_PROVIDER_SITE_OTHER): Payer: Medicare Other | Admitting: Podiatry

## 2018-08-29 DIAGNOSIS — Z794 Long term (current) use of insulin: Secondary | ICD-10-CM | POA: Diagnosis not present

## 2018-08-29 DIAGNOSIS — E1151 Type 2 diabetes mellitus with diabetic peripheral angiopathy without gangrene: Secondary | ICD-10-CM

## 2018-08-29 DIAGNOSIS — B351 Tinea unguium: Secondary | ICD-10-CM | POA: Diagnosis not present

## 2018-08-29 DIAGNOSIS — L84 Corns and callosities: Secondary | ICD-10-CM

## 2018-08-29 DIAGNOSIS — M79676 Pain in unspecified toe(s): Secondary | ICD-10-CM

## 2018-08-29 NOTE — Progress Notes (Deleted)
[facility] [address] [place], [place] [address] Phone: [phone] Subjective:    I'm seeing this patient by the request  of:    CC:   FSE:[id number]  Andrea Marks is a 67 y.o. female coming in with complaint of ***  Onset-  Location Duration-  Character- Aggravating factors- Reliving factors-  Therapies tried-  Severity-     Past Medical History:  Diagnosis Date  . Depression   . Diabetes mellitus   . GERD (gastroesophageal reflux disease)   . Hyperlipidemia   . Hypertension   . Obesity    Past Surgical History:  Procedure Laterality Date  . ABDOMINAL HYSTERECTOMY  1991   BSO   Social History   Socioeconomic History  . Marital status: Married    Spouse name: Not on file  . Number of children: 0  . Years of education: 69  . Highest education level: Not on file  Occupational History  . Occupation: retired from Surveyor, minerals: RETIRED  Social Needs  . Financial resource strain: Not on file  . Food insecurity:    Worry: Not on file    Inability: Not on file  . Transportation needs:    Medical: Not on file    Non-medical: Not on file  Tobacco Use  . Smoking status: Former Research scientist (life sciences)  . Smokeless tobacco: Never Used  Substance and Sexual Activity  . Alcohol use: No  . Drug use: No  . Sexual activity: Yes    Partners: Male  Lifestyle  . Physical activity:    Days per week: Not on file    Minutes per session: Not on file  . Stress: Not on file  Relationships  . Social connections:    Talks on phone: Not on file    Gets together: Not on file    Attends religious service: Not on file    Active member of club or organization: Not on file    Attends meetings of clubs or organizations: Not on file    Relationship status: Not on file  Other Topics Concern  . Not on file  Social History Narrative   No reg exercise   Allergies  Allergen Reactions  . Hydrocodone-Acetaminophen Other (See Comments)   unknown  . Oxycodone Hcl Other (See Comments)    unknown  . Penicillins Other (See Comments)    unknown  . Prednisone    Family History  Problem Relation Age of Onset  . Macular degeneration Mother   . Heart disease Mother        syncope  . Aortic stenosis Mother   . Lung disease Father 49       mesothelioma  . Cancer Maternal Aunt        stomach  . Heart disease Paternal Uncle        cabg  . Diabetes Paternal Uncle   . Heart disease Paternal Uncle   . Diabetes Paternal Uncle   . Diabetes Paternal Uncle   . Heart disease Paternal Uncle   . Heart disease Paternal Uncle   . Heart disease Paternal Uncle   . Lung cancer Unknown        asbestos  . Diabetes Paternal Grandmother   . Cancer Other        lung    Current Outpatient Medications (Endocrine & Metabolic):  Marland Kitchen  Dulaglutide (TRULICITY) 1.5 DH/6.8SH SOPN, Inject 1.5 mg under skin weekly .  estradiol (ESTRACE) 1 MG tablet, Take  1 tablet (1 mg total) by mouth daily. Marland Kitchen  glimepiride (AMARYL) 4 MG tablet,  .  Insulin Glargine (LANTUS SOLOSTAR) 100 UNIT/ML Solostar Pen, 55 units under skin daily at Bedtime .  Insulin Lispro (HUMALOG KWIKPEN) 200 UNIT/ML SOPN, Inject 15-20 Units into the skin 3 (three) times daily before meals. .  metFORMIN (GLUCOPHAGE-XR) 750 MG 24 hr tablet, TAKE 1 TABLET THREE TIMES A DAY   Current Outpatient Medications (Cardiovascular):  .  benazepril (LOTENSIN) 20 MG tablet, Take 1 tablet (20 mg total) by mouth daily. Marland Kitchen  ezetimibe-simvastatin (VYTORIN) 10-40 MG tablet, Take 1 tablet by mouth at bedtime. .  fenofibrate 160 MG tablet, Take 1 tablet (160 mg total) by mouth daily.    Current Facility-Administered Medications (Respiratory):  .  ipratropium-albuterol (DUONEB) 0.5-2.5 (3) MG/3ML nebulizer solution 3 mL  Current Outpatient Medications (Analgesics):  .  acetaminophen (TYLENOL ARTHRITIS PAIN) 650 MG CR tablet, Take 650 mg by mouth 2 (two) times daily as needed. For pain .  aspirin 81 MG  tablet, Take 81 mg by mouth daily.       Current Outpatient Medications (Other):  .  Blood Glucose Monitoring Suppl (ONE TOUCH ULTRA SYSTEM KIT) W/DEVICE KIT, 1 kit by Does not apply route once. Marland Kitchen  FLUoxetine (PROZAC) 20 MG capsule, TAKE 3 CAPSULES DAILY .  glucose blood (ONE TOUCH ULTRA TEST) test strip, USE TO TEST BLOOD SUGAR 3 TIMES DAILY AS INSTRUCTED. DX: E11.65 .  Insulin Pen Needle (NOVOFINE) 32G X 6 MM MISC, To use w/ Lantus .  ONETOUCH DELICA LANCETS FINE MISC, USE AS INSTRUCTED .  ranitidine (ZANTAC) 300 MG capsule, Take 1 capsule (300 mg total) by mouth daily. Marland Kitchen  tiZANidine (ZANAFLEX) 4 MG tablet, Take 1 tablet (4 mg total) by mouth every 6 (six) hours as needed for muscle spasms.     Past medical history, social, surgical and family history all reviewed in electronic medical record.  No pertanent information unless stated regarding to the chief complaint.   Review of Systems:  No headache, visual changes, nausea, vomiting, diarrhea, constipation, dizziness, abdominal pain, skin rash, fevers, chills, night sweats, weight loss, swollen lymph nodes, body aches, joint swelling, muscle aches, chest pain, shortness of breath, mood changes.   Objective  There were no vitals taken for this visit. Systems examined below as of    General: No apparent distress alert and oriented x3 mood and affect normal, dressed appropriately.  HEENT: Pupils equal, extraocular movements intact  Respiratory: Patient's speak in full sentences and does not appear short of breath  Cardiovascular: No lower extremity edema, non tender, no erythema  Skin: Warm dry intact with no signs of infection or rash on extremities or on axial skeleton.  Abdomen: Soft nontender  Neuro: Cranial nerves II through XII are intact, neurovascularly intact in all extremities with 2+ DTRs and 2+ pulses.  Lymph: No lymphadenopathy of posterior or anterior cervical chain or axillae bilaterally.  Gait normal with good balance  and coordination.  MSK:  Non tender with full range of motion and good stability and symmetric strength and tone of shoulders, elbows, wrist, hip, knee and ankles bilaterally.     Impression and Recommendations:     This case required medical decision making of moderate complexity. The above documentation has been reviewed and is accurate and complete Lyndal Pulley, DO       Note: This dictation was prepared with Dragon dictation along with smaller phrase technology. Any transcriptional errors that result from this  process are unintentional.

## 2018-08-29 NOTE — Progress Notes (Signed)
Patient ID: Andrea Marks, female   DOB: July 14, 1951, 67 y.o.   MRN: [id number] Complaint:  Visit Type: Patient returns to my office for continued preventative foot care services. Complaint: Patient states" my nails have grown long and thick and become painful to walk and wear shoes".  Painful callus on bottom of both big toes. Patient has been diagnosed with DM with neuropathy.. The patient presents for preventative foot care services. No changes to ROS  Podiatric Exam: Vascular: dorsalis pedis and posterior tibial pulses are not palpable bilateral due to foot swelling.. Capillary return is immediate. Temperature gradient is WNL. Skin turgor WNL  Sensorium: Diminished Semmes Weinstein monofilament test. Normal tactile sensation bilaterally. Nail Exam: Pt has thick disfigured discolored nails with subungual debris noted bilateral entire nail hallux through fifth toenails Ulcer Exam: There is no evidence of ulcer or pre-ulcerative changes or infection. Orthopedic Exam: Muscle tone and strength are WNL. No limitations in general ROM. No crepitus or effusions noted. Foot type and digits show no abnormalities. Bony prominences are unremarkable. Skin: No Porokeratosis. No infection or ulcers.  Pinch callus hallux B/L.  Diagnosis:  Onychomycosis, , Pain in right toe, pain in left toes,  Callus B/L  Treatment & Plan Procedures and Treatment: Consent by patient was obtained for treatment procedures. The patient understood the discussion of treatment and procedures well. All questions were answered thoroughly reviewed. Debridement of mycotic and hypertrophic toenails, 1 through 5 bilateral and clearing of subungual debris. No ulceration, no infection noted.  Return Visit-Office Procedure: Patient instructed to return to the office for a follow up visit 10 weeks  for continued evaluation and treatment.   Helane Gunther DPM

## 2018-08-30 ENCOUNTER — Ambulatory Visit: Payer: Medicare Other | Admitting: Family Medicine

## 2018-08-30 DIAGNOSIS — Z0289 Encounter for other administrative examinations: Secondary | ICD-10-CM

## 2018-09-03 ENCOUNTER — Encounter: Payer: Self-pay | Admitting: Internal Medicine

## 2018-09-04 ENCOUNTER — Ambulatory Visit: Payer: Medicare Other | Admitting: Internal Medicine

## 2018-09-13 ENCOUNTER — Ambulatory Visit: Payer: Medicare Other | Admitting: Family Medicine

## 2018-09-27 ENCOUNTER — Other Ambulatory Visit: Payer: Self-pay | Admitting: Family Medicine

## 2018-09-27 DIAGNOSIS — E1165 Type 2 diabetes mellitus with hyperglycemia: Principal | ICD-10-CM

## 2018-09-27 DIAGNOSIS — E1151 Type 2 diabetes mellitus with diabetic peripheral angiopathy without gangrene: Secondary | ICD-10-CM

## 2018-09-27 DIAGNOSIS — IMO0002 Reserved for concepts with insufficient information to code with codable children: Secondary | ICD-10-CM

## 2018-10-09 ENCOUNTER — Other Ambulatory Visit: Payer: Self-pay

## 2018-10-09 DIAGNOSIS — E1165 Type 2 diabetes mellitus with hyperglycemia: Principal | ICD-10-CM

## 2018-10-09 DIAGNOSIS — E1151 Type 2 diabetes mellitus with diabetic peripheral angiopathy without gangrene: Secondary | ICD-10-CM

## 2018-10-09 DIAGNOSIS — IMO0002 Reserved for concepts with insufficient information to code with codable children: Secondary | ICD-10-CM

## 2018-10-09 MED ORDER — INSULIN GLARGINE 100 UNIT/ML SOLOSTAR PEN: PEN_INJECTOR | SUBCUTANEOUS | 5 refills | Status: DC

## 2018-10-15 ENCOUNTER — Encounter: Payer: Self-pay | Admitting: Internal Medicine

## 2018-11-08 ENCOUNTER — Encounter: Payer: Self-pay | Admitting: Podiatry

## 2018-11-08 ENCOUNTER — Ambulatory Visit (INDEPENDENT_AMBULATORY_CARE_PROVIDER_SITE_OTHER): Payer: Medicare Other | Admitting: Podiatry

## 2018-11-08 DIAGNOSIS — M79676 Pain in unspecified toe(s): Secondary | ICD-10-CM | POA: Diagnosis not present

## 2018-11-08 DIAGNOSIS — Z794 Long term (current) use of insulin: Secondary | ICD-10-CM | POA: Diagnosis not present

## 2018-11-08 DIAGNOSIS — B351 Tinea unguium: Secondary | ICD-10-CM

## 2018-11-08 DIAGNOSIS — E1151 Type 2 diabetes mellitus with diabetic peripheral angiopathy without gangrene: Secondary | ICD-10-CM | POA: Diagnosis not present

## 2018-11-08 NOTE — Progress Notes (Signed)
Patient ID: Andrea Marks, female   DOB: [birth year], 68 y.o.   MRN: [id number] Complaint:  Visit Type: Patient returns to my office for continued preventative foot care services. Complaint: Patient states" my nails have grown long and thick and become painful to walk and wear shoes".  Painful callus on bottom of both big toes. Patient has been diagnosed with DM with neuropathy.. The patient presents for preventative foot care services. No changes to ROS  Podiatric Exam: Vascular: dorsalis pedis and posterior tibial pulses are not palpable bilateral due to foot swelling.. Capillary return is immediate. Temperature gradient is WNL. Skin turgor WNL  Sensorium: Diminished Semmes Weinstein monofilament test. Normal tactile sensation bilaterally. Nail Exam: Pt has thick disfigured discolored nails with subungual debris noted bilateral entire nail hallux through fifth toenails Ulcer Exam: There is no evidence of ulcer or pre-ulcerative changes or infection. Orthopedic Exam: Muscle tone and strength are WNL. No limitations in general ROM. No crepitus or effusions noted. Foot type and digits show no abnormalities. Bony prominences are unremarkable. Skin: No Porokeratosis. No infection or ulcers.  Pinch callus hallux B/L.  Diagnosis:  Onychomycosis, , Pain in right toe, pain in left toes,  Callus B/L  Treatment & Plan Procedures and Treatment: Consent by patient was obtained for treatment procedures. The patient understood the discussion of treatment and procedures well. All questions were answered thoroughly reviewed. Debridement of mycotic and hypertrophic toenails, 1 through 5 bilateral and clearing of subungual debris. No ulceration, no infection noted.  Return Visit-Office Procedure: Patient instructed to return to the office for a follow up visit 9 weeks  for continued evaluation and treatment.   Helane Gunther DPM

## 2018-11-08 NOTE — Progress Notes (Signed)
[facility] [address] [place], [place] [address] Phone: [phone] Subjective:   Andrea Marks, am serving as a scribe for Dr. Hulan Saas.   CC: Left shoulder pain  OIN:[id number]  Andrea Marks is a 68 y.o. female coming in with complaint of left shoulder pain. Does still have achy pain in shoulder 1/10. When the weather changes she does feel an increase in her pain. Pain runs from elbow into the shoulder she states. Is done with formal PT but has not been doing maintenance exercise.       Past Medical History:  Diagnosis Date  . Depression   . Diabetes mellitus   . GERD (gastroesophageal reflux disease)   . Hyperlipidemia   . Hypertension   . Obesity    Past Surgical History:  Procedure Laterality Date  . ABDOMINAL HYSTERECTOMY  1991   BSO   Social History   Socioeconomic History  . Marital status: Married    Spouse name: Not on file  . Number of children: 0  . Years of education: 12  . Highest education level: Not on file  Occupational History  . Occupation: retired from Surveyor, minerals: RETIRED  Social Needs  . Financial resource strain: Not on file  . Food insecurity:    Worry: Not on file    Inability: Not on file  . Transportation needs:    Medical: Not on file    Non-medical: Not on file  Tobacco Use  . Smoking status: Former Research scientist (life sciences)  . Smokeless tobacco: Never Used  Substance and Sexual Activity  . Alcohol use: Marks  . Drug use: Marks  . Sexual activity: Yes    Partners: Male  Lifestyle  . Physical activity:    Days per week: Not on file    Minutes per session: Not on file  . Stress: Not on file  Relationships  . Social connections:    Talks on phone: Not on file    Gets together: Not on file    Attends religious service: Not on file    Active member of club or organization: Not on file    Attends meetings of clubs or organizations: Not on file    Relationship status: Not on file  Other Topics  Concern  . Not on file  Social History Narrative   Marks reg exercise   Allergies  Allergen Reactions  . Hydrocodone-Acetaminophen Other (See Comments)    unknown  . Oxycodone Hcl Other (See Comments)    unknown  . Penicillins Other (See Comments)    unknown  . Prednisone    Family History  Problem Relation Age of Onset  . Macular degeneration Mother   . Heart disease Mother        syncope  . Aortic stenosis Mother   . Lung disease Father 39       mesothelioma  . Cancer Maternal Aunt        stomach  . Heart disease Paternal Uncle        cabg  . Diabetes Paternal Uncle   . Heart disease Paternal Uncle   . Diabetes Paternal Uncle   . Diabetes Paternal Uncle   . Heart disease Paternal Uncle   . Heart disease Paternal Uncle   . Heart disease Paternal Uncle   . Lung cancer Unknown        asbestos  . Diabetes Paternal Grandmother   . Cancer Other  lung    Current Outpatient Medications (Endocrine & Metabolic):  Marland Kitchen  Dulaglutide (TRULICITY) 1.5 LK/5.6YB SOPN, Inject 1.5 mg under skin weekly .  estradiol (ESTRACE) 1 MG tablet, Take 1 tablet (1 mg total) by mouth daily. Marland Kitchen  glimepiride (AMARYL) 4 MG tablet,  .  Insulin Glargine (LANTUS SOLOSTAR) 100 UNIT/ML Solostar Pen, 55 units under skin daily at Bedtime .  Insulin Lispro (HUMALOG KWIKPEN) 200 UNIT/ML SOPN, Inject 15-20 Units into the skin 3 (three) times daily before meals. .  metFORMIN (GLUCOPHAGE-XR) 750 MG 24 hr tablet, TAKE 1 TABLET THREE TIMES A DAY   Current Outpatient Medications (Cardiovascular):  .  benazepril (LOTENSIN) 20 MG tablet, Take 1 tablet (20 mg total) by mouth daily. Marland Kitchen  ezetimibe-simvastatin (VYTORIN) 10-40 MG tablet, Take 1 tablet by mouth at bedtime. .  fenofibrate 160 MG tablet, Take 1 tablet (160 mg total) by mouth daily.    Current Facility-Administered Medications (Respiratory):  .  ipratropium-albuterol (DUONEB) 0.5-2.5 (3) MG/3ML nebulizer solution 3 mL  Current Outpatient Medications  (Analgesics):  .  acetaminophen (TYLENOL ARTHRITIS PAIN) 650 MG CR tablet, Take 650 mg by mouth 2 (two) times daily as needed. For pain .  aspirin 81 MG tablet, Take 81 mg by mouth daily.       Current Outpatient Medications (Other):  .  Blood Glucose Monitoring Suppl (ONE TOUCH ULTRA SYSTEM KIT) W/DEVICE KIT, 1 kit by Does not apply route once. Marland Kitchen  FLUoxetine (PROZAC) 20 MG capsule, TAKE 3 CAPSULES DAILY .  glucose blood (ONE TOUCH ULTRA TEST) test strip, USE TO TEST BLOOD SUGAR 3 TIMES DAILY AS INSTRUCTED. DX: E11.65 .  Insulin Pen Needle (NOVOFINE) 32G X 6 MM MISC, To use w/ Lantus .  ONETOUCH DELICA LANCETS FINE MISC, USE AS INSTRUCTED .  ranitidine (ZANTAC) 300 MG capsule, Take 1 capsule (300 mg total) by mouth daily. Marland Kitchen  tiZANidine (ZANAFLEX) 4 MG tablet, Take 1 tablet (4 mg total) by mouth every 6 (six) hours as needed for muscle spasms.     Past medical history, social, surgical and family history all reviewed in electronic medical record.  Marks pertanent information unless stated regarding to the chief complaint.   Review of Systems:  Marks headache, visual changes, nausea, vomiting, diarrhea, constipation, dizziness, abdominal pain, skin rash, fevers, chills, night sweats, weight loss, swollen lymph nodes, body aches, joint swelling, muscle aches, chest pain, shortness of breath, mood changes.   Objective  Blood pressure 128/70, pulse 76, height _0  (1.6 m), weight 267 lb (121.1 kg), SpO2 97 %.    General: Marks apparent distress alert and oriented x3 mood and affect normal, dressed appropriately.  HEENT: Pupils equal, extraocular movements intact  Respiratory: Patient's speak in full sentences and does not appear short of breath  Cardiovascular: Marks lower extremity edema, non tender, Marks erythema  Skin: Warm dry intact with Marks signs of infection or rash on extremities or on axial skeleton.  Abdomen: Soft nontender  Neuro: Cranial nerves II through XII are intact, neurovascularly  intact in all extremities with 2+ DTRs and 2+ pulses.  Lymph: Marks lymphadenopathy of posterior or anterior cervical chain or axillae bilaterally.  Gait antalgic MSK:  tender with limited range of motion and good stability and symmetric strength and tone of  elbows, wrist, hip, knee and ankles bilaterally.  Left shoulder exam does show some atrophy.  Patient though does have atrophy of the contralateral shoulder girdle as well.  Patient's rotator cuff 4 out of 5 compared to  the contralateral side.  Positive impingement still noted.    Impression and Recommendations:     The above documentation has been reviewed and is accurate and complete Lyndal Pulley, DO       Note: This dictation was prepared with Dragon dictation along with smaller phrase technology. Any transcriptional errors that result from this process are unintentional.

## 2018-11-09 ENCOUNTER — Ambulatory Visit (INDEPENDENT_AMBULATORY_CARE_PROVIDER_SITE_OTHER): Payer: Medicare Other | Admitting: Family Medicine

## 2018-11-09 ENCOUNTER — Encounter: Payer: Self-pay | Admitting: Family Medicine

## 2018-11-09 DIAGNOSIS — S46012A Strain of muscle(s) and tendon(s) of the rotator cuff of left shoulder, initial encounter: Secondary | ICD-10-CM

## 2018-11-09 NOTE — Patient Instructions (Signed)
Good to see you  Andrea Marks is your friend Stay active I am proud of you  Call [phone] when you nee dus for the shoulder or the knee  Happy New Year!

## 2018-11-09 NOTE — Assessment & Plan Note (Signed)
Rotator cuff tear.  Discussed icing regimen and home exercise.  As long does well with the neck we will continue to monitor.  Worsening symptoms will consider more injections or further evaluation with advanced imaging.  Patient though feels confident that she is doing relatively better.

## 2018-11-20 ENCOUNTER — Other Ambulatory Visit: Payer: Self-pay | Admitting: Family Medicine

## 2018-11-20 DIAGNOSIS — I10 Essential (primary) hypertension: Secondary | ICD-10-CM

## 2018-11-21 ENCOUNTER — Other Ambulatory Visit: Payer: Self-pay | Admitting: Family Medicine

## 2018-11-21 DIAGNOSIS — E785 Hyperlipidemia, unspecified: Secondary | ICD-10-CM

## 2018-12-06 ENCOUNTER — Other Ambulatory Visit: Payer: Self-pay | Admitting: Family Medicine

## 2018-12-06 DIAGNOSIS — E785 Hyperlipidemia, unspecified: Secondary | ICD-10-CM

## 2018-12-15 ENCOUNTER — Ambulatory Visit (INDEPENDENT_AMBULATORY_CARE_PROVIDER_SITE_OTHER): Payer: Medicare Other | Admitting: Family Medicine

## 2018-12-15 ENCOUNTER — Encounter: Payer: Self-pay | Admitting: Family Medicine

## 2018-12-15 VITALS — BP 116/66 | HR 73 | Temp 98.0°F | Resp 16 | Ht 63.0 in | Wt 268.2 lb

## 2018-12-15 DIAGNOSIS — I10 Essential (primary) hypertension: Secondary | ICD-10-CM

## 2018-12-15 DIAGNOSIS — E785 Hyperlipidemia, unspecified: Secondary | ICD-10-CM

## 2018-12-15 DIAGNOSIS — Z794 Long term (current) use of insulin: Secondary | ICD-10-CM

## 2018-12-15 DIAGNOSIS — E1169 Type 2 diabetes mellitus with other specified complication: Secondary | ICD-10-CM | POA: Insufficient documentation

## 2018-12-15 DIAGNOSIS — Z23 Encounter for immunization: Secondary | ICD-10-CM

## 2018-12-15 DIAGNOSIS — E1165 Type 2 diabetes mellitus with hyperglycemia: Secondary | ICD-10-CM | POA: Diagnosis not present

## 2018-12-15 DIAGNOSIS — E538 Deficiency of other specified B group vitamins: Secondary | ICD-10-CM | POA: Insufficient documentation

## 2018-12-15 DIAGNOSIS — R5383 Other fatigue: Secondary | ICD-10-CM | POA: Diagnosis not present

## 2018-12-15 LAB — COMPREHENSIVE METABOLIC PANEL
ALT: 19 U/L (ref 0–35)
AST: 34 U/L (ref 0–37)
Albumin: 4 g/dL (ref 3.5–5.2)
Alkaline Phosphatase: 34 U/L — ABNORMAL LOW (ref 39–117)
BUN: 18 mg/dL (ref 6–23)
CO2: 28 mEq/L (ref 19–32)
Calcium: 9.3 mg/dL (ref 8.4–10.5)
Chloride: 99 mEq/L (ref 96–112)
Creatinine, Ser: 0.87 mg/dL (ref 0.40–1.20)
GFR: 64.78 mL/min (ref >60.00)
Glucose, Bld: 265 mg/dL — ABNORMAL HIGH (ref 70–99)
Potassium: 4.9 mEq/L (ref 3.5–5.1)
Sodium: 138 mEq/L (ref 135–145)
Total Bilirubin: 0.5 mg/dL (ref 0.2–1.2)
Total Protein: 6.3 g/dL (ref 6.0–8.3)

## 2018-12-15 LAB — CBC WITH DIFFERENTIAL/PLATELET
Basophils Absolute: 0 10*3/uL (ref 0.0–0.1)
Basophils Relative: 0.5 % (ref 0.0–3.0)
Eosinophils Absolute: 0.1 10*3/uL (ref 0.0–0.7)
Eosinophils Relative: 1.4 % (ref 0.0–5.0)
HCT: 46.3 % — ABNORMAL HIGH (ref 36.0–46.0)
Hemoglobin: 15 g/dL (ref 12.0–15.0)
Lymphocytes Relative: 18.4 % (ref 12.0–46.0)
Lymphs Abs: 1.8 10*3/uL (ref 0.7–4.0)
MCHC: 32.5 g/dL (ref 30.0–36.0)
MCV: 86.8 fl (ref 78.0–100.0)
Monocytes Absolute: 0.5 10*3/uL (ref 0.1–1.0)
Monocytes Relative: 5 % (ref 3.0–12.0)
Neutro Abs: 7.3 10*3/uL (ref 1.4–7.7)
Neutrophils Relative %: 74.7 % (ref 43.0–77.0)
Platelets: 227 10*3/uL (ref 150.0–400.0)
RBC: 5.33 Mil/uL — ABNORMAL HIGH (ref 3.87–5.11)
RDW: 14.7 % (ref 11.5–15.5)
WBC: 9.8 10*3/uL (ref 4.0–10.5)

## 2018-12-15 LAB — LIPID PANEL
Cholesterol: 134 mg/dL (ref 0–200)
HDL: 50.8 mg/dL (ref >39.00)
LDL Cholesterol: 45 mg/dL (ref 0–99)
NonHDL: 83.15
Total CHOL/HDL Ratio: 3
Triglycerides: 192 mg/dL — ABNORMAL HIGH (ref 0.0–149.0)
VLDL: 38.4 mg/dL (ref 0.0–40.0)

## 2018-12-15 LAB — VITAMIN B12: Vitamin B-12: 136 pg/mL — ABNORMAL LOW (ref 211–911)

## 2018-12-15 LAB — HEMOGLOBIN A1C: Hgb A1c MFr Bld: 10.4 % — ABNORMAL HIGH (ref 4.6–6.5)

## 2018-12-15 NOTE — Patient Instructions (Signed)
Carbohydrate Counting for Diabetes Mellitus, Adult  Carbohydrate counting is a method of keeping track of how many carbohydrates you eat. Eating carbohydrates naturally increases the amount of sugar (glucose) in the blood. Counting how many carbohydrates you eat helps keep your blood glucose within normal limits, which helps you manage your diabetes (diabetes mellitus). It is important to know how many carbohydrates you can safely have in each meal. This is different for every person. A diet and nutrition specialist (registered dietitian) can help you make a meal plan and calculate how many carbohydrates you should have at each meal and snack. Carbohydrates are found in the following foods:  Grains, such as breads and cereals.  Dried beans and soy products.  Starchy vegetables, such as potatoes, peas, and corn.  Fruit and fruit juices.  Milk and yogurt.  Sweets and snack foods, such as cake, cookies, candy, chips, and soft drinks. How do I count carbohydrates? There are two ways to count carbohydrates in food. You can use either of the methods or a combination of both. Reading "Nutrition Facts" on packaged food The "Nutrition Facts" list is included on the labels of almost all packaged foods and beverages in the [place] It includes:  The serving size.  Information about nutrients in each serving, including the grams (g) of carbohydrate per serving. To use the "Nutrition Facts":  Decide how many servings you will have.  Multiply the number of servings by the number of carbohydrates per serving.  The resulting number is the total amount of carbohydrates that you will be having. Learning standard serving sizes of other foods When you eat carbohydrate foods that are not packaged or do not include "Nutrition Facts" on the label, you need to measure the servings in order to count the amount of carbohydrates:  Measure the foods that you will eat with a food scale or measuring cup, if needed.   Decide how many standard-size servings you will eat.  Multiply the number of servings by 15. Most carbohydrate-rich foods have about 15 g of carbohydrates per serving. ? For example, if you eat 8 oz (170 g) of strawberries, you will have eaten 2 servings and 30 g of carbohydrates (2 servings x 15 g = 30 g).  For foods that have more than one food mixed, such as soups and casseroles, you must count the carbohydrates in each food that is included. The following list contains standard serving sizes of common carbohydrate-rich foods. Each of these servings has about 15 g of carbohydrates:   hamburger bun or  English muffin.   oz (15 mL) syrup.   oz (14 g) jelly.  1 slice of bread.  1 six-inch tortilla.  3 oz (85 g) cooked rice or pasta.  4 oz (113 g) cooked dried beans.  4 oz (113 g) starchy vegetable, such as peas, corn, or potatoes.  4 oz (113 g) hot cereal.  4 oz (113 g) mashed potatoes or  of a large baked potato.  4 oz (113 g) canned or frozen fruit.  4 oz (120 mL) fruit juice.  4-6 crackers.  6 chicken nuggets.  6 oz (170 g) unsweetened dry cereal.  6 oz (170 g) plain fat-free yogurt or yogurt sweetened with artificial sweeteners.  8 oz (240 mL) milk.  8 oz (170 g) fresh fruit or one small piece of fruit.  24 oz (680 g) popped popcorn. Example of carbohydrate counting Sample meal  3 oz (85 g) chicken breast.  6 oz (170 g)   brown rice.  4 oz (113 g) corn.  8 oz (240 mL) milk.  8 oz (170 g) strawberries with sugar-free whipped topping. Carbohydrate calculation 1. Identify the foods that contain carbohydrates: ? Rice. ? Corn. ? Milk. ? Strawberries. 2. Calculate how many servings you have of each food: ? 2 servings rice. ? 1 serving corn. ? 1 serving milk. ? 1 serving strawberries. 3. Multiply each number of servings by 15 g: ? 2 servings rice x 15 g = 30 g. ? 1 serving corn x 15 g = 15 g. ? 1 serving milk x 15 g = 15 g. ? 1 serving  strawberries x 15 g = 15 g. 4. Add together all of the amounts to find the total grams of carbohydrates eaten: ? 30 g + 15 g + 15 g + 15 g = 75 g of carbohydrates total. Summary  Carbohydrate counting is a method of keeping track of how many carbohydrates you eat.  Eating carbohydrates naturally increases the amount of sugar (glucose) in the blood.  Counting how many carbohydrates you eat helps keep your blood glucose within normal limits, which helps you manage your diabetes.  A diet and nutrition specialist (registered dietitian) can help you make a meal plan and calculate how many carbohydrates you should have at each meal and snack. This information is not intended to replace advice given to you by your health care provider. Make sure you discuss any questions you have with your health care provider. Document Released: 10/18/2005 Document Revised: 04/27/2017 Document Reviewed: 03/31/2016 Elsevier Interactive Patient Education  2019 [employer]  

## 2018-12-15 NOTE — Assessment & Plan Note (Signed)
Tolerating statin, encouraged heart healthy diet, avoid trans fats, minimize simple carbs and saturated fats. Increase exercise as tolerated 

## 2018-12-15 NOTE — Assessment & Plan Note (Signed)
Well controlled, no changes to meds. Encouraged heart healthy diet such as the DASH diet and exercise as tolerated.  °

## 2018-12-15 NOTE — Progress Notes (Signed)
Patient ID: Andrea Marks, female    DOB: 12/03/1950  Age: 68 y.o. MRN: [id number]    Subjective:  Subjective  HPI Andrea Marks presents for f/u dm, chol and bp.    She c/o fatigue No complaints  HYPERTENSION   Blood pressure range-not checking   Chest pain- no      Dyspnea- no Lightheadedness- no   Edema- no  Other side effects - no   Medication compliance: good Low salt diet- yes    DIABETES    Blood Sugar ranges-not checking   Polyuria- no New Visual problems- no  Hypoglycemic symptoms- no  Other side effects-no Medication compliance - good Last eye exam- April 2019 Foot exam- today   HYPERLIPIDEMIA  Medication compliance- good RUQ pain- no  Muscle aches- no Other side effects-no      Review of Systems  Constitutional: Positive for fatigue. Negative for appetite change, diaphoresis and unexpected weight change.  Eyes: Negative for pain, redness and visual disturbance.  Respiratory: Negative for cough, chest tightness, shortness of breath and wheezing.   Cardiovascular: Negative for chest pain, palpitations and leg swelling.  Endocrine: Negative for cold intolerance, heat intolerance, polydipsia, polyphagia and polyuria.  Genitourinary: Negative for difficulty urinating, dysuria and frequency.  Neurological: Negative for dizziness, light-headedness, numbness and headaches.    History Past Medical History:  Diagnosis Date  . Depression   . Diabetes mellitus   . GERD (gastroesophageal reflux disease)   . Hyperlipidemia   . Hypertension   . Obesity     She has a past surgical history that includes Abdominal hysterectomy (1991).   Her family history includes Aortic stenosis in her mother; Cancer in her maternal aunt and another family member; Diabetes in her paternal grandmother, paternal uncle, paternal uncle, and paternal uncle; Heart disease in her mother, paternal uncle, paternal uncle, paternal uncle, paternal uncle, and paternal uncle; Lung  cancer in her unknown relative; Lung disease (age of onset: 47) in her father; Macular degeneration in her mother.She reports that she has quit smoking. She has never used smokeless tobacco. She reports that she does not drink alcohol or use drugs.  Current Outpatient Medications on File Prior to Visit  Medication Sig Dispense Refill  . acetaminophen (TYLENOL ARTHRITIS PAIN) 650 MG CR tablet Take 650 mg by mouth 2 (two) times daily as needed. For pain    . aspirin 81 MG tablet Take 81 mg by mouth daily.      . benazepril (LOTENSIN) 20 MG tablet TAKE 1 TABLET DAILY 90 tablet 3  . Blood Glucose Monitoring Suppl (ONE TOUCH ULTRA SYSTEM KIT) W/DEVICE KIT 1 kit by Does not apply route once. 1 each 0  . Dulaglutide (TRULICITY) 1.5 RD/4.0CX SOPN Inject 1.5 mg under skin weekly 12 pen 3  . estradiol (ESTRACE) 1 MG tablet Take 1 tablet (1 mg total) by mouth daily. 90 tablet 1  . ezetimibe-simvastatin (VYTORIN) 10-40 MG tablet TAKE 1 TABLET AT BEDTIME (NEED OFFICE VISIT/LAB) 90 tablet 0  . fenofibrate 160 MG tablet TAKE 1 TABLET DAILY 90 tablet 0  . FLUoxetine (PROZAC) 20 MG capsule TAKE 3 CAPSULES DAILY 270 capsule 4  . glimepiride (AMARYL) 4 MG tablet     . glucose blood (ONE TOUCH ULTRA TEST) test strip USE TO TEST BLOOD SUGAR 3 TIMES DAILY AS INSTRUCTED. DX: E11.65 200 each 3  . Insulin Glargine (LANTUS SOLOSTAR) 100 UNIT/ML Solostar Pen 55 units under skin daily at Bedtime 10 pen 5  . Insulin Lispro (HUMALOG KWIKPEN)  200 UNIT/ML SOPN Inject 15-20 Units into the skin 3 (three) times daily before meals. 10 pen 5  . Insulin Pen Needle (NOVOFINE) 32G X 6 MM MISC To use w/ Lantus 100 each 3  . metFORMIN (GLUCOPHAGE-XR) 750 MG 24 hr tablet TAKE 1 TABLET THREE TIMES A DAY 270 tablet 1  . ONETOUCH DELICA LANCETS FINE MISC USE AS INSTRUCTED 1 each 9  . ranitidine (ZANTAC) 300 MG capsule Take 1 capsule (300 mg total) by mouth daily. 90 capsule 3   Current Facility-Administered Medications on File Prior to  Visit  Medication Dose Route Frequency Provider Last Rate Last Dose  . ipratropium-albuterol (DUONEB) 0.5-2.5 (3) MG/3ML nebulizer solution 3 mL  3 mL Nebulization Q6H Copland, Jessica C, MD   3 mL at 11/15/16 1330     Objective:  Objective  Physical Exam Vitals signs and nursing note reviewed.  Constitutional:      Appearance: She is well-developed.  HENT:     Head: Normocephalic and atraumatic.  Eyes:     Conjunctiva/sclera: Conjunctivae normal.  Neck:     Musculoskeletal: Normal range of motion and neck supple.     Thyroid: No thyromegaly.     Vascular: No carotid bruit or JVD.  Cardiovascular:     Rate and Rhythm: Normal rate and regular rhythm.     Heart sounds: Normal heart sounds. No murmur.  Pulmonary:     Effort: Pulmonary effort is normal. No respiratory distress.     Breath sounds: Normal breath sounds. No wheezing or rales.  Chest:     Chest wall: No tenderness.  Neurological:     Mental Status: She is alert and oriented to person, place, and time.    Diabetic Foot Exam - Simple   Simple Foot Form Diabetic Foot exam was performed with the following findings:  Yes 12/15/2018 11:38 AM  Visual Inspection No deformities, no ulcerations, no other skin breakdown bilaterally:  Yes Sensation Testing Intact to touch and monofilament testing bilaterally:  Yes Pulse Check Posterior Tibialis and Dorsalis pulse intact bilaterally:  Yes Comments     BP 116/66 (BP Location: Right Arm, Cuff Size: Large)   Pulse 73   Temp 98 F (36.7 C) (Oral)   Resp 16   Ht '5\' 3"'$  (1.6 m)   Wt 268 lb 3.2 oz (121.7 kg)   SpO2 96%   BMI 47.51 kg/m  Wt Readings from Last 3 Encounters:  12/15/18 268 lb 3.2 oz (121.7 kg)  11/09/18 267 lb (121.1 kg)  07/18/18 282 lb (127.9 kg)     Lab Results  Component Value Date   WBC 8.1 04/21/2017   HGB 14.3 04/21/2017   HCT 44.3 04/21/2017   PLT 188.0 04/21/2017   GLUCOSE 278 (H) 12/19/2017   CHOL 115 12/19/2017   TRIG 164.0 (H)  12/19/2017   HDL 48.40 12/19/2017   LDLDIRECT 64.0 04/21/2017   LDLCALC 33 12/19/2017   ALT 12 12/19/2017   AST 12 12/19/2017   NA 137 12/19/2017   K 4.7 12/19/2017   CL 99 12/19/2017   CREATININE 0.91 12/19/2017   BUN 19 12/19/2017   CO2 28 12/19/2017   TSH 3.22 08/07/2015   INR 1.2 RATIO 04/19/2007   HGBA1C 7.5 (A) 05/18/2018   MICROALBUR 2.0 (H) 12/19/2017    Dg Shoulder Left  Result Date: 05/29/2018 CLINICAL DATA:  Left shoulder pain after fall last month. EXAM: LEFT SHOULDER - 2+ VIEW COMPARISON:  None. FINDINGS: There is no evidence of fracture or  dislocation. There is no evidence of arthropathy or other focal bone abnormality. Soft tissues are unremarkable. IMPRESSION: Normal left shoulder. Electronically Signed   By: Marijo Conception, M.D.   On: 05/29/2018 16:20     Assessment & Plan:  Plan  I have discontinued Andrea Marks's tiZANidine. I am also having her maintain her acetaminophen, aspirin, ONE TOUCH ULTRA SYSTEM KIT, ONETOUCH DELICA LANCETS FINE, Insulin Pen Needle, Dulaglutide, estradiol, Insulin Lispro, glucose blood, FLUoxetine, ranitidine, glimepiride, metFORMIN, Insulin Glargine, benazepril, ezetimibe-simvastatin, and fenofibrate. We will continue to administer ipratropium-albuterol.  No orders of the defined types were placed in this encounter.   Problem List Items Addressed This Visit      Unprioritized   B12 deficiency    Check labs today      Essential hypertension    Well controlled, no changes to meds. Encouraged heart healthy diet such as the DASH diet and exercise as tolerated.       Relevant Orders   Comprehensive metabolic panel   Hemoglobin A1c   Lipid panel   Fatigue - Primary    Check labs today      Relevant Orders   Vitamin B12   Comprehensive metabolic panel   CBC with Differential/Platelet   Hyperlipidemia associated with type 2 diabetes mellitus ([place])    Tolerating statin, encouraged heart healthy diet, avoid trans fats,  minimize simple carbs and saturated fats. Increase exercise as tolerated      Relevant Orders   Comprehensive metabolic panel   Lipid panel   Type 2 diabetes mellitus with hyperglycemia, with long-term current use of insulin ([place])    Per endo      Uncontrolled type 2 diabetes mellitus with hyperglycemia ([facility])   Relevant Orders   Comprehensive metabolic panel   Hemoglobin A1c      Follow-up: Return in about 6 months (around 06/15/2019), or if symptoms worsen or fail to improve, for annual exam, fasting.  [name], DO

## 2018-12-15 NOTE — Assessment & Plan Note (Signed)
Check labs today.

## 2018-12-15 NOTE — Assessment & Plan Note (Signed)
Per endo °

## 2018-12-15 NOTE — Addendum Note (Signed)
Addended by: Thelma Barge D on: 12/15/2018 04:30 PM   Modules accepted: Orders

## 2018-12-19 ENCOUNTER — Encounter: Payer: Self-pay | Admitting: Family Medicine

## 2018-12-20 ENCOUNTER — Other Ambulatory Visit: Payer: Self-pay | Admitting: Family Medicine

## 2018-12-20 DIAGNOSIS — Z780 Asymptomatic menopausal state: Secondary | ICD-10-CM

## 2018-12-22 ENCOUNTER — Ambulatory Visit (INDEPENDENT_AMBULATORY_CARE_PROVIDER_SITE_OTHER): Payer: Medicare Other

## 2018-12-22 DIAGNOSIS — E538 Deficiency of other specified B group vitamins: Secondary | ICD-10-CM | POA: Diagnosis not present

## 2018-12-22 MED ORDER — CYANOCOBALAMIN 1000 MCG/ML IJ SOLN
1000.0000 ug | Freq: Once | INTRAMUSCULAR | Status: AC
  Administered 2018-12-22: 1000 ug via INTRAMUSCULAR

## 2018-12-22 NOTE — Patient Instructions (Signed)
Please schedule next nurse visit B12 injection in one week.

## 2018-12-22 NOTE — Progress Notes (Addendum)
Pt here for monthly B12 injection per Dr. Laury Axon she needs once a week for 4 weeks then monthly.   B12 given IM on right deltoid, and pt tolerated injection well.  Next B12 injection scheduled for 2/ 28/ 2020.  Donato Schultz, DO

## 2018-12-23 NOTE — Progress Notes (Signed)
[facility] [address] [place], [place] [address] Phone: [phone] Subjective:   [name], am serving as a scribe for Dr. Hulan Saas.   CC: Right knee pain  [id number]  Andrea Marks is a 68 y.o. female coming in with complaint of right knee pain. She also said that the left knee is starting to hurt. Feels sharp pain occasionally and achy there rest of the time. Also has almost fallen. Use ice and tylenol for pain. Uses cane if walking longer distances. Has tried injections with success from [place].  Patient denies having worsening pain.  Mild increase in instability.  Cannot walk more than 200 feet.      Past Medical History:  Diagnosis Date  . Depression   . Diabetes mellitus   . GERD (gastroesophageal reflux disease)   . Hyperlipidemia   . Hypertension   . Obesity    Past Surgical History:  Procedure Laterality Date  . ABDOMINAL HYSTERECTOMY  1991   BSO   Social History   Socioeconomic History  . Marital status: Married    Spouse name: Not on file  . Number of children: 0  . Years of education: 64  . Highest education level: Not on file  Occupational History  . Occupation: retired from Surveyor, minerals: RETIRED  Social Needs  . Financial resource strain: Not on file  . Food insecurity:    Worry: Not on file    Inability: Not on file  . Transportation needs:    Medical: Not on file    Non-medical: Not on file  Tobacco Use  . Smoking status: Former Research scientist (life sciences)  . Smokeless tobacco: Never Used  Substance and Sexual Activity  . Alcohol use: No  . Drug use: No  . Sexual activity: Yes    Partners: Male  Lifestyle  . Physical activity:    Days per week: Not on file    Minutes per session: Not on file  . Stress: Not on file  Relationships  . Social connections:    Talks on phone: Not on file    Gets together: Not on file    Attends religious service: Not on file    Active member of club or  organization: Not on file    Attends meetings of clubs or organizations: Not on file    Relationship status: Not on file  Other Topics Concern  . Not on file  Social History Narrative   No reg exercise   Allergies  Allergen Reactions  . Hydrocodone-Acetaminophen Other (See Comments)    unknown  . Oxycodone Hcl Other (See Comments)    unknown  . Penicillins Other (See Comments)    unknown  . Prednisone    Family History  Problem Relation Age of Onset  . Macular degeneration Mother   . Heart disease Mother        syncope  . Aortic stenosis Mother   . Lung disease Father 49       mesothelioma  . Cancer Maternal Aunt        stomach  . Heart disease Paternal Uncle        cabg  . Diabetes Paternal Uncle   . Heart disease Paternal Uncle   . Diabetes Paternal Uncle   . Diabetes Paternal Uncle   . Heart disease Paternal Uncle   . Heart disease Paternal Uncle   . Heart disease Paternal Uncle   . Lung cancer Unknown  asbestos  . Diabetes Paternal Grandmother   . Cancer Other        lung    Current Outpatient Medications (Endocrine & Metabolic):  Marland Kitchen  Dulaglutide (TRULICITY) 1.5 XN/1.7GY SOPN, Inject 1.5 mg under skin weekly .  estradiol (ESTRACE) 1 MG tablet, TAKE 1 TABLET DAILY .  glimepiride (AMARYL) 4 MG tablet,  .  Insulin Glargine (LANTUS SOLOSTAR) 100 UNIT/ML Solostar Pen, 55 units under skin daily at Bedtime .  Insulin Lispro (HUMALOG KWIKPEN) 200 UNIT/ML SOPN, Inject 15-20 Units into the skin 3 (three) times daily before meals. .  metFORMIN (GLUCOPHAGE-XR) 750 MG 24 hr tablet, TAKE 1 TABLET THREE TIMES A DAY   Current Outpatient Medications (Cardiovascular):  .  benazepril (LOTENSIN) 20 MG tablet, TAKE 1 TABLET DAILY .  ezetimibe-simvastatin (VYTORIN) 10-40 MG tablet, TAKE 1 TABLET AT BEDTIME (NEED OFFICE VISIT/LAB) .  fenofibrate 160 MG tablet, TAKE 1 TABLET DAILY    Current Facility-Administered Medications (Respiratory):  .  ipratropium-albuterol  (DUONEB) 0.5-2.5 (3) MG/3ML nebulizer solution 3 mL  Current Outpatient Medications (Analgesics):  .  acetaminophen (TYLENOL ARTHRITIS PAIN) 650 MG CR tablet, Take 650 mg by mouth 2 (two) times daily as needed. For pain .  aspirin 81 MG tablet, Take 81 mg by mouth daily.       Current Outpatient Medications (Other):  .  Blood Glucose Monitoring Suppl (ONE TOUCH ULTRA SYSTEM KIT) W/DEVICE KIT, 1 kit by Does not apply route once. Marland Kitchen  FLUoxetine (PROZAC) 20 MG capsule, TAKE 3 CAPSULES DAILY .  glucose blood (ONE TOUCH ULTRA TEST) test strip, USE TO TEST BLOOD SUGAR 3 TIMES DAILY AS INSTRUCTED. DX: E11.65 .  Insulin Pen Needle (NOVOFINE) 32G X 6 MM MISC, To use w/ Lantus .  ONETOUCH DELICA LANCETS FINE MISC, USE AS INSTRUCTED .  ranitidine (ZANTAC) 300 MG capsule, Take 1 capsule (300 mg total) by mouth daily.     Past medical history, social, surgical and family history all reviewed in electronic medical record.  No pertanent information unless stated regarding to the chief complaint.   Review of Systems:  No headache, visual changes, nausea, vomiting, diarrhea, constipation, dizziness, abdominal pain, skin rash, fevers, chills, night sweats, weight loss, swollen lymph nodes,  chest pain, shortness of breath, mood changes.  Positive muscle aches, body aches  Objective  Blood pressure 132/80, pulse 89, height _0  (1.6 m), weight 268 lb (121.6 kg), SpO2 97 %.     General: No apparent distress alert and oriented x3 mood and affect normal, dressed appropriately.  HEENT: Pupils equal, extraocular movements intact  Respiratory: Patient's speak in full sentences and does not appear short of breath  Cardiovascular: Trace lower extremity edema, non tender, no erythema  Skin: Warm dry intact with no signs of infection or rash on extremities or on axial skeleton.  Abdomen: Soft nontender  Neuro: Cranial nerves II through XII are intact, neurovascularly intact in all extremities with 2+ DTRs and  2+ pulses.  Lymph: No lymphadenopathy of posterior or anterior cervical chain or axillae bilaterally.  Gait antalgic.  MSK:  Non tender with full range of motion and good stability and symmetric strength and tone of , elbows, wrist, hip and ankles bilaterally.  Limited arthritic changes of the shoulders especially left with atrophy Knee: Right valgus deformity noted. Large thigh to calf ratio.  Tender to palpation over medial and PF joint line.  ROM full in flexion and extension and lower leg rotation. instability with valgus force.  painful patellar  compression. Patellar glide with moderate crepitus. Patellar and quadriceps tendons unremarkable. Hamstring and quadriceps strength is normal. Contralateral knee shows mild arthritic changes but no significant instability  Knee injection   Impression and Recommendations:     This case required medical decision making of moderate complexity. The above documentation has been reviewed and is accurate and complete Lyndal Pulley, DO       Note: This dictation was prepared with Dragon dictation along with smaller phrase technology. Any transcriptional errors that result from this process are unintentional.

## 2018-12-25 ENCOUNTER — Ambulatory Visit (INDEPENDENT_AMBULATORY_CARE_PROVIDER_SITE_OTHER): Payer: Medicare Other | Admitting: Family Medicine

## 2018-12-25 ENCOUNTER — Encounter: Payer: Self-pay | Admitting: Family Medicine

## 2018-12-25 DIAGNOSIS — M1711 Unilateral primary osteoarthritis, right knee: Secondary | ICD-10-CM

## 2018-12-25 NOTE — Patient Instructions (Signed)
Good to see you  Ice is your friend pennsaid pinkie amount topically 2 times daily as needed.  Injected knee today will get approval for monovisc  If worsenign pain then we will start monovisc in 3-4 weeks

## 2018-12-25 NOTE — Assessment & Plan Note (Signed)
Injection given today.  Discussed icing regimen and home exercise.  Discussed which activities to do which wants to avoid.  Discussed posture and ergonomics.  Discussed with patient about topical anti-inflammatories.  Patient declined any type of custom bracing at the moment.  Follow-up again in 4 weeks and could be a candidate for [place] supplementation.

## 2018-12-29 ENCOUNTER — Ambulatory Visit (INDEPENDENT_AMBULATORY_CARE_PROVIDER_SITE_OTHER): Payer: Medicare Other

## 2018-12-29 DIAGNOSIS — E538 Deficiency of other specified B group vitamins: Secondary | ICD-10-CM | POA: Diagnosis not present

## 2018-12-29 MED ORDER — CYANOCOBALAMIN 1000 MCG/ML IJ SOLN
1000.0000 ug | Freq: Once | INTRAMUSCULAR | Status: AC
  Administered 2018-12-29: 1000 ug via INTRAMUSCULAR

## 2018-12-29 NOTE — Progress Notes (Addendum)
Pt here for monthly B12 injection per   B12 given IM in right deltoid and pt tolerated injection well.   Next B12 injection scheduled for next week.    Donato Schultz, DO

## 2019-01-05 ENCOUNTER — Ambulatory Visit (INDEPENDENT_AMBULATORY_CARE_PROVIDER_SITE_OTHER): Payer: Medicare Other | Admitting: *Deleted

## 2019-01-05 DIAGNOSIS — E538 Deficiency of other specified B group vitamins: Secondary | ICD-10-CM | POA: Diagnosis not present

## 2019-01-05 MED ORDER — CYANOCOBALAMIN 1000 MCG/ML IJ SOLN
1000.0000 ug | Freq: Once | INTRAMUSCULAR | Status: AC
  Administered 2019-01-05: 1000 ug via INTRAMUSCULAR

## 2019-01-05 NOTE — Progress Notes (Signed)
Pt here for  3rd weekly B12 injection per Dr. Zola Button.  B12 given IM in left deltoid and pt tolerated injection well.   Next B12 injection scheduled for next week.

## 2019-01-12 ENCOUNTER — Other Ambulatory Visit: Payer: Self-pay

## 2019-01-12 ENCOUNTER — Ambulatory Visit (INDEPENDENT_AMBULATORY_CARE_PROVIDER_SITE_OTHER): Payer: Medicare Other

## 2019-01-12 DIAGNOSIS — E538 Deficiency of other specified B group vitamins: Secondary | ICD-10-CM | POA: Diagnosis not present

## 2019-01-12 MED ORDER — CYANOCOBALAMIN 1000 MCG/ML IJ SOLN
1000.0000 ug | Freq: Once | INTRAMUSCULAR | Status: AC
  Administered 2019-01-12: 1000 ug via INTRAMUSCULAR

## 2019-01-12 NOTE — Progress Notes (Signed)
Reviewed  Dondra Rhett R Lowne Chase, DO  

## 2019-01-12 NOTE — Progress Notes (Signed)
Pt here for monthly B12 injection per Dr. Lowne  B12 1000mcg given IM, and pt tolerated injection well.  Next B12 injection scheduled for next month.    

## 2019-01-13 NOTE — Progress Notes (Signed)
[facility] [address], [place] [address] Phone: [phone] Subjective:   I [name] am serving as a Education administrator for Dr. Hulan Saas.  I'm seeing this patient by the request  of:    CC:   [id number]   12/25/2018 Injection given today.  Discussed icing regimen and home exercise.  Discussed which activities to do which wants to avoid.  Discussed posture and ergonomics.  Discussed with patient about topical anti-inflammatories.  Patient declined any type of custom bracing at the moment.  Follow-up again in 4 weeks and could be a candidate for Visco supplementation.  Updated 01/15/2019 Andrea Marks is a 68 y.o. female coming in with complaint of right knee pain. States the knee is painful today.  No improvement with the steroid injection.  Having increasing instability.      Past Medical History:  Diagnosis Date  . Depression   . Diabetes mellitus   . GERD (gastroesophageal reflux disease)   . Hyperlipidemia   . Hypertension   . Obesity    Past Surgical History:  Procedure Laterality Date  . ABDOMINAL HYSTERECTOMY  1991   BSO   Social History   Socioeconomic History  . Marital status: Married    Spouse name: Not on file  . Number of children: 0  . Years of education: 103  . Highest education level: Not on file  Occupational History  . Occupation: retired from Surveyor, minerals: RETIRED  Social Needs  . Financial resource strain: Not on file  . Food insecurity:    Worry: Not on file    Inability: Not on file  . Transportation needs:    Medical: Not on file    Non-medical: Not on file  Tobacco Use  . Smoking status: Former Research scientist (life sciences)  . Smokeless tobacco: Never Used  Substance and Sexual Activity  . Alcohol use: No  . Drug use: No  . Sexual activity: Yes    Partners: Male  Lifestyle  . Physical activity:    Days per week: Not on file    Minutes per session: Not on file  . Stress: Not on file   Relationships  . Social connections:    Talks on phone: Not on file    Gets together: Not on file    Attends religious service: Not on file    Active member of club or organization: Not on file    Attends meetings of clubs or organizations: Not on file    Relationship status: Not on file  Other Topics Concern  . Not on file  Social History Narrative   No reg exercise   Allergies  Allergen Reactions  . Hydrocodone-Acetaminophen Other (See Comments)    unknown  . Oxycodone Hcl Other (See Comments)    unknown  . Penicillins Other (See Comments)    unknown  . Prednisone    Family History  Problem Relation Age of Onset  . Macular degeneration Mother   . Heart disease Mother        syncope  . Aortic stenosis Mother   . Lung disease Father 17       mesothelioma  . Cancer Maternal Aunt        stomach  . Heart disease Paternal Uncle        cabg  . Diabetes Paternal Uncle   . Heart disease Paternal Uncle   . Diabetes Paternal Uncle   . Diabetes Paternal Uncle   . Heart  disease Paternal Uncle   . Heart disease Paternal Uncle   . Heart disease Paternal Uncle   . Lung cancer Unknown        asbestos  . Diabetes Paternal Grandmother   . Cancer Other        lung    Current Outpatient Medications (Endocrine & Metabolic):  Marland Kitchen  Dulaglutide (TRULICITY) 1.5 JX/9.1YN SOPN, Inject 1.5 mg under skin weekly .  estradiol (ESTRACE) 1 MG tablet, TAKE 1 TABLET DAILY .  glimepiride (AMARYL) 4 MG tablet,  .  Insulin Glargine (LANTUS SOLOSTAR) 100 UNIT/ML Solostar Pen, 55 units under skin daily at Bedtime .  Insulin Lispro (HUMALOG KWIKPEN) 200 UNIT/ML SOPN, Inject 15-20 Units into the skin 3 (three) times daily before meals. .  metFORMIN (GLUCOPHAGE-XR) 750 MG 24 hr tablet, TAKE 1 TABLET THREE TIMES A DAY   Current Outpatient Medications (Cardiovascular):  .  benazepril (LOTENSIN) 20 MG tablet, TAKE 1 TABLET DAILY .  ezetimibe-simvastatin (VYTORIN) 10-40 MG tablet, TAKE 1 TABLET AT  BEDTIME (NEED OFFICE VISIT/LAB) .  fenofibrate 160 MG tablet, TAKE 1 TABLET DAILY    Current Facility-Administered Medications (Respiratory):  .  ipratropium-albuterol (DUONEB) 0.5-2.5 (3) MG/3ML nebulizer solution 3 mL  Current Outpatient Medications (Analgesics):  .  acetaminophen (TYLENOL ARTHRITIS PAIN) 650 MG CR tablet, Take 650 mg by mouth 2 (two) times daily as needed. For pain .  aspirin 81 MG tablet, Take 81 mg by mouth daily.       Current Outpatient Medications (Other):  .  Blood Glucose Monitoring Suppl (ONE TOUCH ULTRA SYSTEM KIT) W/DEVICE KIT, 1 kit by Does not apply route once. Marland Kitchen  FLUoxetine (PROZAC) 20 MG capsule, TAKE 3 CAPSULES DAILY .  glucose blood (ONE TOUCH ULTRA TEST) test strip, USE TO TEST BLOOD SUGAR 3 TIMES DAILY AS INSTRUCTED. DX: E11.65 .  Insulin Pen Needle (NOVOFINE) 32G X 6 MM MISC, To use w/ Lantus .  ONETOUCH DELICA LANCETS FINE MISC, USE AS INSTRUCTED .  ranitidine (ZANTAC) 300 MG capsule, Take 1 capsule (300 mg total) by mouth daily.     Past medical history, social, surgical and family history all reviewed in electronic medical record.  No pertanent information unless stated regarding to the chief complaint.   Review of Systems:  No headache, visual changes, nausea, vomiting, diarrhea, constipation, dizziness, abdominal pain, skin rash, fevers, chills, night sweats, weight loss, swollen lymph nodes, body aches, joint swelling, chest pain, shortness of breath, mood changes.  Positive muscle aches  Objective  Blood pressure (!) 150/70, pulse 83, height '5\' 3"'$  (1.6 m), weight 272 lb (123.4 kg), SpO2 97 %.    General: No apparent distress alert and oriented x3 mood and affect normal, dressed appropriately.  HEENT: Pupils equal, extraocular movements intact  Respiratory: Patient's speak in full sentences and does not appear short of breath  Cardiovascular: No lower extremity edema, non tender, no erythema  Skin: Warm dry intact with no signs of  infection or rash on extremities or on axial skeleton.  Abdomen: Soft nontender  Neuro: Cranial nerves II through XII are intact, neurovascularly intact in all extremities with 2+ DTRs and 2+ pulses.  Lymph: No lymphadenopathy of posterior or anterior cervical chain or axillae bilaterally.  Gait antalgic MSK:  tender with limited range of motion and stability and symmetric strength and tone of shoulders, elbows, wrist, hip and ankles bilaterally.  Knee: Right valgus deformity noted.  Abnormal thigh to calf ratio.  Tender to palpation over medial and PF joint  line.  ROM full in flexion and extension and lower leg rotation. instability with valgus force.  painful patellar compression. Patellar glide with moderate crepitus. Patellar and quadriceps tendons unremarkable. Hamstring and quadriceps strength is normal.   After informed written and verbal consent, patient was seated on exam table. Right knee was prepped with alcohol swab and utilizing anterolateral approach, patient's right knee space was injected with Monovisc injection in a prefilled syringe.  Procedure well without immediate complications.    Impression and Recommendations:     This case required medical decision making of moderate complexity. The above documentation has been reviewed and is accurate and complete Lyndal Pulley, DO       Note: This dictation was prepared with Dragon dictation along with smaller phrase technology. Any transcriptional errors that result from this process are unintentional.

## 2019-01-15 ENCOUNTER — Other Ambulatory Visit: Payer: Self-pay

## 2019-01-15 ENCOUNTER — Encounter: Payer: Self-pay | Admitting: Family Medicine

## 2019-01-15 ENCOUNTER — Ambulatory Visit (INDEPENDENT_AMBULATORY_CARE_PROVIDER_SITE_OTHER): Payer: Medicare Other | Admitting: Family Medicine

## 2019-01-15 DIAGNOSIS — M1711 Unilateral primary osteoarthritis, right knee: Secondary | ICD-10-CM

## 2019-01-15 NOTE — Assessment & Plan Note (Signed)
Monovisc given today.  Discussed icing regimen and home exercise.  Discussed which activities to do which was to avoid.  Discussed icing regimen and home exercises.  Discussed possibly using the brace on a more regular basis.  Follow-up with me again in 4 to 8 weeks

## 2019-01-15 NOTE — Patient Instructions (Signed)
Good to see you  Ice is your friend Ice 20 minutes 2 times daily. Usually after activity and before bed. Tried monovisc today  Will take about 4 weeks to work  Ice is your friend Please see me again in 6 weeks

## 2019-01-17 ENCOUNTER — Ambulatory Visit: Payer: Medicare Other | Admitting: Podiatry

## 2019-01-30 ENCOUNTER — Encounter: Payer: Self-pay | Admitting: Family Medicine

## 2019-02-01 ENCOUNTER — Encounter: Payer: Self-pay | Admitting: Family Medicine

## 2019-02-01 ENCOUNTER — Other Ambulatory Visit: Payer: Self-pay | Admitting: Internal Medicine

## 2019-02-05 ENCOUNTER — Encounter: Payer: Self-pay | Admitting: Family Medicine

## 2019-02-05 ENCOUNTER — Other Ambulatory Visit: Payer: Self-pay | Admitting: Family Medicine

## 2019-02-05 DIAGNOSIS — K219 Gastro-esophageal reflux disease without esophagitis: Secondary | ICD-10-CM

## 2019-02-05 MED ORDER — FAMOTIDINE 20 MG PO TABS
20.0000 mg | ORAL_TABLET | Freq: Two times a day (BID) | ORAL | 3 refills | Status: DC
Start: 2019-02-05 — End: 2019-09-10

## 2019-02-16 ENCOUNTER — Ambulatory Visit: Payer: Medicare Other

## 2019-02-28 ENCOUNTER — Encounter: Payer: Self-pay | Admitting: Family Medicine

## 2019-02-28 DIAGNOSIS — E785 Hyperlipidemia, unspecified: Secondary | ICD-10-CM

## 2019-03-01 ENCOUNTER — Ambulatory Visit: Payer: Medicare Other | Admitting: Family Medicine

## 2019-03-02 MED ORDER — EZETIMIBE-SIMVASTATIN 10-40 MG PO TABS: ORAL_TABLET | ORAL | 0 refills | Status: DC

## 2019-03-08 ENCOUNTER — Other Ambulatory Visit: Payer: Self-pay | Admitting: Family Medicine

## 2019-03-08 DIAGNOSIS — E785 Hyperlipidemia, unspecified: Secondary | ICD-10-CM

## 2019-03-11 ENCOUNTER — Other Ambulatory Visit: Payer: Self-pay | Admitting: Family Medicine

## 2019-03-11 DIAGNOSIS — E785 Hyperlipidemia, unspecified: Secondary | ICD-10-CM

## 2019-03-26 ENCOUNTER — Other Ambulatory Visit: Payer: Self-pay | Admitting: Family Medicine

## 2019-03-26 DIAGNOSIS — IMO0002 Reserved for concepts with insufficient information to code with codable children: Secondary | ICD-10-CM

## 2019-03-26 DIAGNOSIS — E1151 Type 2 diabetes mellitus with diabetic peripheral angiopathy without gangrene: Secondary | ICD-10-CM

## 2019-04-10 ENCOUNTER — Encounter: Payer: Self-pay | Admitting: Podiatry

## 2019-04-10 ENCOUNTER — Ambulatory Visit (INDEPENDENT_AMBULATORY_CARE_PROVIDER_SITE_OTHER): Payer: Medicare Other | Admitting: Podiatry

## 2019-04-10 ENCOUNTER — Other Ambulatory Visit: Payer: Self-pay

## 2019-04-10 VITALS — Temp 97.4°F

## 2019-04-10 DIAGNOSIS — E1151 Type 2 diabetes mellitus with diabetic peripheral angiopathy without gangrene: Secondary | ICD-10-CM | POA: Diagnosis not present

## 2019-04-10 DIAGNOSIS — B351 Tinea unguium: Secondary | ICD-10-CM | POA: Diagnosis not present

## 2019-04-10 DIAGNOSIS — Z794 Long term (current) use of insulin: Secondary | ICD-10-CM

## 2019-04-10 DIAGNOSIS — L84 Corns and callosities: Secondary | ICD-10-CM

## 2019-04-10 DIAGNOSIS — M79676 Pain in unspecified toe(s): Secondary | ICD-10-CM | POA: Diagnosis not present

## 2019-04-10 NOTE — Progress Notes (Signed)
Patient ID: Andrea [facility], female   DOB: 1951/09/26, 68 y.o.   MRN: [id number] Complaint:  Visit Type: Patient returns to my office for continued preventative foot care services. Complaint: Patient states" my nails have grown long and thick and become painful to walk and wear shoes".  Painful callus on bottom of both big toes. Patient has been diagnosed with DM with neuropathy.. The patient presents for preventative foot care services. No changes to ROS  Podiatric Exam: Vascular: dorsalis pedis and posterior tibial pulses are not palpable bilateral due to foot swelling.. Capillary return is immediate. Temperature gradient is WNL. Skin turgor WNL  Sensorium: Diminished Semmes Weinstein monofilament test. Normal tactile sensation bilaterally. Nail Exam: Pt has thick disfigured discolored nails with subungual debris noted bilateral entire nail hallux through fifth toenails Ulcer Exam: There is no evidence of ulcer or pre-ulcerative changes or infection. Orthopedic Exam: Muscle tone and strength are WNL. No limitations in general ROM. No crepitus or effusions noted. Foot type and digits show no abnormalities. Bony prominences are unremarkable. Skin: No Porokeratosis. No infection or ulcers.  Pinch callus hallux B/L.  Diagnosis:  Onychomycosis, , Pain in right toe, pain in left toes,  Callus B/L  Treatment & Plan Procedures and Treatment: Consent by patient was obtained for treatment procedures. The patient understood the discussion of treatment and procedures well. All questions were answered thoroughly reviewed. Debridement of mycotic and hypertrophic toenails, 1 through 5 bilateral and clearing of subungual debris. No ulceration, no infection noted.  Return Visit-Office Procedure: Patient instructed to return to the office for a follow up visit 9 weeks  for continued evaluation and treatment.   Gardiner Barefoot DPM

## 2019-04-11 ENCOUNTER — Telehealth: Payer: Self-pay | Admitting: *Deleted

## 2019-04-11 DIAGNOSIS — E785 Hyperlipidemia, unspecified: Secondary | ICD-10-CM

## 2019-04-11 MED ORDER — EZETIMIBE-SIMVASTATIN 10-40 MG PO TABS: ORAL_TABLET | ORAL | 1 refills | Status: DC

## 2019-04-20 ENCOUNTER — Encounter: Payer: Self-pay | Admitting: Family Medicine

## 2019-04-20 NOTE — Telephone Encounter (Signed)
Pt's metformin XR 750mg  has been recalled. Please advise.

## 2019-04-23 ENCOUNTER — Other Ambulatory Visit: Payer: Self-pay | Admitting: Family Medicine

## 2019-04-23 DIAGNOSIS — E1165 Type 2 diabetes mellitus with hyperglycemia: Secondary | ICD-10-CM

## 2019-04-23 MED ORDER — METFORMIN HCL 850 MG PO TABS: ORAL_TABLET | ORAL | 1 refills | Status: DC

## 2019-04-23 NOTE — Telephone Encounter (Signed)
I changed it to metformin 850 3 a day---immediate release --- not xr

## 2019-05-02 NOTE — Telephone Encounter (Signed)
Error

## 2019-05-29 ENCOUNTER — Other Ambulatory Visit: Payer: Self-pay | Admitting: Family Medicine

## 2019-05-29 DIAGNOSIS — E1165 Type 2 diabetes mellitus with hyperglycemia: Secondary | ICD-10-CM

## 2019-06-08 ENCOUNTER — Encounter: Payer: Self-pay | Admitting: Family Medicine

## 2019-06-11 MED ORDER — HUMALOG KWIKPEN 200 UNIT/ML ~~LOC~~ SOPN: 15.0000 [IU] | PEN_INJECTOR | Freq: Three times a day (TID) | SUBCUTANEOUS | 5 refills | Status: DC

## 2019-06-12 ENCOUNTER — Ambulatory Visit: Payer: Medicare Other | Admitting: Podiatry

## 2019-06-26 ENCOUNTER — Other Ambulatory Visit: Payer: Self-pay | Admitting: Family Medicine

## 2019-06-26 DIAGNOSIS — E1165 Type 2 diabetes mellitus with hyperglycemia: Secondary | ICD-10-CM

## 2019-06-27 ENCOUNTER — Encounter: Payer: Self-pay | Admitting: Podiatry

## 2019-06-27 ENCOUNTER — Ambulatory Visit (INDEPENDENT_AMBULATORY_CARE_PROVIDER_SITE_OTHER): Payer: Medicare Other | Admitting: Podiatry

## 2019-06-27 ENCOUNTER — Other Ambulatory Visit: Payer: Self-pay

## 2019-06-27 VITALS — Temp 98.3°F

## 2019-06-27 DIAGNOSIS — B351 Tinea unguium: Secondary | ICD-10-CM

## 2019-06-27 DIAGNOSIS — Z794 Long term (current) use of insulin: Secondary | ICD-10-CM | POA: Diagnosis not present

## 2019-06-27 DIAGNOSIS — M79676 Pain in unspecified toe(s): Secondary | ICD-10-CM | POA: Diagnosis not present

## 2019-06-27 DIAGNOSIS — E1151 Type 2 diabetes mellitus with diabetic peripheral angiopathy without gangrene: Secondary | ICD-10-CM

## 2019-06-27 NOTE — Progress Notes (Signed)
Patient ID: Andrea Marks, female   DOB: 06/26/1951, 68 y.o.   MRN: [id number] Complaint:  Visit Type: Patient returns to my office for continued preventative foot care services. Complaint: Patient states" my nails have grown long and thick and become painful to walk and wear shoes".  Painful callus on bottom of both big toes. Patient has been diagnosed with DM with neuropathy.. The patient presents for preventative foot care services. No changes to ROS  Podiatric Exam: Vascular: dorsalis pedis and posterior tibial pulses are not palpable bilateral due to foot swelling.. Capillary return is immediate. Temperature gradient is WNL. Skin turgor WNL  Sensorium: Diminished Semmes Weinstein monofilament test. Normal tactile sensation bilaterally. Nail Exam: Pt has thick disfigured discolored nails with subungual debris noted bilateral entire nail hallux through fifth toenails Ulcer Exam: There is no evidence of ulcer or pre-ulcerative changes or infection. Orthopedic Exam: Muscle tone and strength are WNL. No limitations in general ROM. No crepitus or effusions noted. Foot type and digits show no abnormalities. Bony prominences are unremarkable. Skin: No Porokeratosis. No infection or ulcers.  Pinch callus hallux B/L.  Diagnosis:  Onychomycosis, , Pain in right toe, pain in left toes,    Treatment & Plan Procedures and Treatment: Consent by patient was obtained for treatment procedures. The patient understood the discussion of treatment and procedures well. All questions were answered thoroughly reviewed. Debridement of mycotic and hypertrophic toenails, 1 through 5 bilateral and clearing of subungual debris. No ulceration, no infection noted.  Return Visit-Office Procedure: Patient instructed to return to the office for a follow up visit 9 weeks  for continued evaluation and treatment.   Kaianna Dolezal DPM 

## 2019-06-28 ENCOUNTER — Other Ambulatory Visit: Payer: Self-pay | Admitting: Internal Medicine

## 2019-06-28 NOTE — Telephone Encounter (Signed)
Last office visit 05/18/2018  Cancel/No-show? one  Future office visit scheduled? no

## 2019-06-28 NOTE — Telephone Encounter (Signed)
Pt was lost for f/u - refills are now per PCP

## 2019-07-08 ENCOUNTER — Encounter: Payer: Self-pay | Admitting: Family Medicine

## 2019-07-08 DIAGNOSIS — E1165 Type 2 diabetes mellitus with hyperglycemia: Secondary | ICD-10-CM

## 2019-07-08 DIAGNOSIS — E785 Hyperlipidemia, unspecified: Secondary | ICD-10-CM

## 2019-07-10 MED ORDER — HUMALOG KWIKPEN 200 UNIT/ML ~~LOC~~ SOPN: 15.0000 [IU] | PEN_INJECTOR | Freq: Three times a day (TID) | SUBCUTANEOUS | 5 refills | Status: DC

## 2019-07-10 MED ORDER — METFORMIN HCL 850 MG PO TABS: ORAL_TABLET | ORAL | 1 refills | Status: DC

## 2019-07-10 MED ORDER — EZETIMIBE-SIMVASTATIN 10-40 MG PO TABS: ORAL_TABLET | ORAL | 1 refills | Status: DC

## 2019-08-02 ENCOUNTER — Other Ambulatory Visit: Payer: Self-pay | Admitting: Family Medicine

## 2019-08-02 DIAGNOSIS — E1165 Type 2 diabetes mellitus with hyperglycemia: Secondary | ICD-10-CM

## 2019-08-29 ENCOUNTER — Other Ambulatory Visit: Payer: Self-pay

## 2019-08-29 ENCOUNTER — Encounter: Payer: Self-pay | Admitting: Podiatry

## 2019-08-29 ENCOUNTER — Ambulatory Visit (INDEPENDENT_AMBULATORY_CARE_PROVIDER_SITE_OTHER): Payer: Medicare Other | Admitting: Podiatry

## 2019-08-29 DIAGNOSIS — M79676 Pain in unspecified toe(s): Secondary | ICD-10-CM | POA: Diagnosis not present

## 2019-08-29 DIAGNOSIS — E1151 Type 2 diabetes mellitus with diabetic peripheral angiopathy without gangrene: Secondary | ICD-10-CM

## 2019-08-29 DIAGNOSIS — B351 Tinea unguium: Secondary | ICD-10-CM

## 2019-08-29 DIAGNOSIS — Z794 Long term (current) use of insulin: Secondary | ICD-10-CM | POA: Diagnosis not present

## 2019-08-29 NOTE — Progress Notes (Signed)
Patient ID: Andrea Marks, female   DOB: 09/30/1951, 68 y.o.   MRN: [id number] Complaint:  Visit Type: Patient returns to my office for continued preventative foot care services. Complaint: Patient states" my nails have grown long and thick and become painful to walk and wear shoes".  Painful callus on bottom of both big toes. Patient has been diagnosed with DM with neuropathy.. The patient presents for preventative foot care services. No changes to ROS  Podiatric Exam: Vascular: dorsalis pedis and posterior tibial pulses are not palpable bilateral due to foot swelling.. Capillary return is immediate. Temperature gradient is WNL. Skin turgor WNL  Sensorium: Diminished Semmes Weinstein monofilament test. Normal tactile sensation bilaterally. Nail Exam: Pt has thick disfigured discolored nails with subungual debris noted bilateral entire nail hallux through fifth toenails Ulcer Exam: There is no evidence of ulcer or pre-ulcerative changes or infection. Orthopedic Exam: Muscle tone and strength are WNL. No limitations in general ROM. No crepitus or effusions noted. Foot type and digits show no abnormalities. Bony prominences are unremarkable. Skin: No Porokeratosis. No infection or ulcers.  Pinch callus hallux B/L.  Diagnosis:  Onychomycosis, , Pain in right toe, pain in left toes,    Treatment & Plan Procedures and Treatment: Consent by patient was obtained for treatment procedures. The patient understood the discussion of treatment and procedures well. All questions were answered thoroughly reviewed. Debridement of mycotic and hypertrophic toenails, 1 through 5 bilateral and clearing of subungual debris. No ulceration, no infection noted.  Return Visit-Office Procedure: Patient instructed to return to the office for a follow up visit 9 weeks  for continued evaluation and treatment.   [name] DPM 

## 2019-09-07 LAB — HM DIABETES EYE EXAM

## 2019-09-10 ENCOUNTER — Ambulatory Visit (INDEPENDENT_AMBULATORY_CARE_PROVIDER_SITE_OTHER): Payer: Medicare Other | Admitting: Family Medicine

## 2019-09-10 ENCOUNTER — Encounter: Payer: Self-pay | Admitting: Family Medicine

## 2019-09-10 ENCOUNTER — Other Ambulatory Visit: Payer: Self-pay

## 2019-09-10 VITALS — BP 124/78 | HR 85 | Temp 97.0°F | Resp 18 | Ht 63.0 in | Wt 261.6 lb

## 2019-09-10 DIAGNOSIS — E785 Hyperlipidemia, unspecified: Secondary | ICD-10-CM

## 2019-09-10 DIAGNOSIS — I10 Essential (primary) hypertension: Secondary | ICD-10-CM | POA: Diagnosis not present

## 2019-09-10 DIAGNOSIS — E1165 Type 2 diabetes mellitus with hyperglycemia: Secondary | ICD-10-CM | POA: Diagnosis not present

## 2019-09-10 DIAGNOSIS — Z780 Asymptomatic menopausal state: Secondary | ICD-10-CM | POA: Diagnosis not present

## 2019-09-10 DIAGNOSIS — Z794 Long term (current) use of insulin: Secondary | ICD-10-CM

## 2019-09-10 DIAGNOSIS — K219 Gastro-esophageal reflux disease without esophagitis: Secondary | ICD-10-CM

## 2019-09-10 LAB — MICROALBUMIN / CREATININE URINE RATIO
Creatinine,U: 241.3 mg/dL
Microalb Creat Ratio: 12 mg/g (ref 0.0–30.0)
Microalb, Ur: 28.9 mg/dL — ABNORMAL HIGH (ref 0.0–1.9)

## 2019-09-10 LAB — COMPREHENSIVE METABOLIC PANEL
ALT: 17 U/L (ref 0–35)
AST: 28 U/L (ref 0–37)
Albumin: 4 g/dL (ref 3.5–5.2)
Alkaline Phosphatase: 37 U/L — ABNORMAL LOW (ref 39–117)
BUN: 16 mg/dL (ref 6–23)
CO2: 28 mEq/L (ref 19–32)
Calcium: 9.4 mg/dL (ref 8.4–10.5)
Chloride: 100 mEq/L (ref 96–112)
Creatinine, Ser: 0.85 mg/dL (ref 0.40–1.20)
GFR: 66.4 mL/min (ref >60.00)
Glucose, Bld: 185 mg/dL — ABNORMAL HIGH (ref 70–99)
Potassium: 4.6 mEq/L (ref 3.5–5.1)
Sodium: 139 mEq/L (ref 135–145)
Total Bilirubin: 0.5 mg/dL (ref 0.2–1.2)
Total Protein: 6.2 g/dL (ref 6.0–8.3)

## 2019-09-10 LAB — LIPID PANEL
Cholesterol: 114 mg/dL (ref 0–200)
HDL: 45.6 mg/dL (ref >39.00)
LDL Cholesterol: 30 mg/dL (ref 0–99)
NonHDL: 68.81
Total CHOL/HDL Ratio: 3
Triglycerides: 192 mg/dL — ABNORMAL HIGH (ref 0.0–149.0)
VLDL: 38.4 mg/dL (ref 0.0–40.0)

## 2019-09-10 LAB — HEMOGLOBIN A1C: Hgb A1c MFr Bld: 10.5 % — ABNORMAL HIGH (ref 4.6–6.5)

## 2019-09-10 MED ORDER — FENOFIBRATE 160 MG PO TABS: ORAL_TABLET | ORAL | 3 refills | Status: DC

## 2019-09-10 MED ORDER — ESTRADIOL 1 MG PO TABS: 1.0000 mg | ORAL_TABLET | Freq: Every day | ORAL | 4 refills | Status: DC

## 2019-09-10 MED ORDER — EZETIMIBE-SIMVASTATIN 10-40 MG PO TABS: ORAL_TABLET | ORAL | 1 refills | Status: DC

## 2019-09-10 MED ORDER — PANTOPRAZOLE SODIUM 40 MG PO TBEC: 40.0000 mg | DELAYED_RELEASE_TABLET | Freq: Every day | ORAL | 3 refills | Status: DC

## 2019-09-10 NOTE — Progress Notes (Signed)
Patient ID: Andrea Marks, female    DOB: 12/28/1950  Age: 68 y.o. MRN: [id number]    Subjective:  Subjective  HPI Andrea Marks presents for f/u dm, lipid and bp.   She has not been back to endo because she does not want to check her blood sugars.    She also needs refills  HYPERTENSION   Blood pressure range-not checking 2  Chest pain- no      Dyspnea- no Lightheadedness- no   Edema- no  Other side effects - no   Medication compliance: good Low salt diet- no    DIABETES    Blood Sugar ranges-not checking   Polyuria- no New Visual problems- no  Hypoglycemic symptoms- no  Other side effects-no Medication compliance - good Last eye exam- due Foot exam- sees podiatry   HYPERLIPIDEMIA  Medication compliance- good RUQ pain- no  Muscle aches- no Other side effects-no      Review of Systems  Constitutional: Negative for activity change, appetite change, fatigue and unexpected weight change.  Respiratory: Negative for cough and shortness of breath.   Cardiovascular: Negative for chest pain and palpitations.  Psychiatric/Behavioral: Negative for behavioral problems and dysphoric mood. The patient is not nervous/anxious.     History Past Medical History:  Diagnosis Date  . Depression   . Diabetes mellitus   . GERD (gastroesophageal reflux disease)   . Hyperlipidemia   . Hypertension   . Obesity     She has a past surgical history that includes Abdominal hysterectomy (1991).   Her family history includes Aortic stenosis in her mother; Cancer in her maternal aunt and another family member; Diabetes in her paternal grandmother, paternal uncle, paternal uncle, and paternal uncle; Heart disease in her mother, paternal uncle, paternal uncle, paternal uncle, paternal uncle, and paternal uncle; Lung cancer in her unknown relative; Lung disease (age of onset: 11) in her father; Macular degeneration in her mother.She reports that she quit smoking about 25 years ago. She  has never used smokeless tobacco. She reports that she does not drink alcohol or use drugs.  Current Outpatient Medications on File Prior to Visit  Medication Sig Dispense Refill  . acetaminophen (TYLENOL ARTHRITIS PAIN) 650 MG CR tablet Take 650 mg by mouth 2 (two) times daily as needed. For pain    . aspirin 81 MG tablet Take 81 mg by mouth daily.      . benazepril (LOTENSIN) 20 MG tablet TAKE 1 TABLET DAILY 90 tablet 3  . Blood Glucose Monitoring Suppl (ONE TOUCH ULTRA SYSTEM KIT) W/DEVICE KIT 1 kit by Does not apply route once. 1 each 0  . Insulin Glargine (LANTUS SOLOSTAR) 100 UNIT/ML Solostar Pen 55 units under skin daily at Bedtime 10 pen 5  . Insulin Lispro (HUMALOG KWIKPEN) 200 UNIT/ML SOPN Inject 15-20 Units into the skin 3 (three) times daily before meals. 10 pen 5  . Insulin Pen Needle (NOVOFINE) 32G X 6 MM MISC To use w/ Lantus 100 each 3  . metFORMIN (GLUCOPHAGE) 850 MG tablet TAKE 1 TABLET EVERY MORNING AND TAKE 2 TABLETS EVERY EVENING 270 tablet 1  . TRULICITY 1.5 VO/3.5KK SOPN INJECT 1.5 MG UNDER THE SKIN WEEKLY 6 mL 3   Current Facility-Administered Medications on File Prior to Visit  Medication Dose Route Frequency Provider Last Rate Last Dose  . ipratropium-albuterol (DUONEB) 0.5-2.5 (3) MG/3ML nebulizer solution 3 mL  3 mL Nebulization Q6H Copland, Andrea C, MD   3 mL at 11/15/16 1330  Objective:  Objective  Physical Exam Vitals signs and nursing note reviewed.  Constitutional:      Appearance: She is well-developed.  HENT:     Head: Normocephalic and atraumatic.  Eyes:     Conjunctiva/sclera: Conjunctivae normal.  Neck:     Musculoskeletal: Normal range of motion and neck supple.     Thyroid: No thyromegaly.     Vascular: No carotid bruit or JVD.  Cardiovascular:     Rate and Rhythm: Normal rate and regular rhythm.     Heart sounds: Normal heart sounds. No murmur.  Pulmonary:     Effort: Pulmonary effort is normal. No respiratory distress.     Breath  sounds: Normal breath sounds. No wheezing or rales.  Chest:     Chest wall: No tenderness.  Neurological:     Mental Status: She is alert and oriented to person, place, and time.    BP 124/78 (BP Location: Right Arm, Patient Position: Sitting, Cuff Size: Large)   Pulse 85   Temp (!) 97 F (36.1 Marks) (Temporal)   Resp 18   Ht '5\' 3"'$  (1.6 m)   Wt 261 lb 9.6 oz (118.7 kg)   SpO2 96%   BMI 46.34 kg/m  Wt Readings from Last 3 Encounters:  09/10/19 261 lb 9.6 oz (118.7 kg)  01/15/19 272 lb (123.4 kg)  12/25/18 268 lb (121.6 kg)     Lab Results  Component Value Date   WBC 9.8 12/15/2018   HGB 15.0 12/15/2018   HCT 46.3 (H) 12/15/2018   PLT 227.0 12/15/2018   GLUCOSE 185 (H) 09/10/2019   CHOL 114 09/10/2019   TRIG 192.0 (H) 09/10/2019   HDL 45.60 09/10/2019   LDLDIRECT 64.0 04/21/2017   LDLCALC 30 09/10/2019   ALT 17 09/10/2019   AST 28 09/10/2019   NA 139 09/10/2019   K 4.6 09/10/2019   CL 100 09/10/2019   CREATININE 0.85 09/10/2019   BUN 16 09/10/2019   CO2 28 09/10/2019   TSH 3.22 08/07/2015   INR 1.2 RATIO 04/19/2007   HGBA1C 10.5 (H) 09/10/2019   MICROALBUR 28.9 (H) 09/10/2019    Dg Shoulder Left  Result Date: 05/29/2018 CLINICAL DATA:  Left shoulder pain after fall last month. EXAM: LEFT SHOULDER - 2+ VIEW COMPARISON:  None. FINDINGS: There is no evidence of fracture or dislocation. There is no evidence of arthropathy or other focal bone abnormality. Soft tissues are unremarkable. IMPRESSION: Normal left shoulder. Electronically Signed   By: Marijo Conception, M.D.   On: 05/29/2018 16:20     Assessment & Plan:  Plan  I have discontinued [name]'s OneTouch Delica Lancets Fine, glucose blood, FLUoxetine, ranitidine, glimepiride, and famotidine. I have also changed her estradiol. Additionally, I am having her start on pantoprazole. Lastly, I am having her maintain her acetaminophen, aspirin, ONE TOUCH ULTRA SYSTEM KIT, Insulin Pen Needle, Insulin Glargine,  benazepril, Trulicity, HumaLOG KwikPen, metFORMIN, fenofibrate, and ezetimibe-simvastatin. We will continue to administer ipratropium-albuterol.  Meds ordered this encounter  Medications  . fenofibrate 160 MG tablet    Sig: TAKE 1 TABLET DAILY (NEED OFFICE VISIT FOR GREATER QUANTITIES/REFILLS)    Dispense:  90 tablet    Refill:  3  . estradiol (ESTRACE) 1 MG tablet    Sig: Take 1 tablet (1 mg total) by mouth daily.    Dispense:  90 tablet    Refill:  4  . ezetimibe-simvastatin (VYTORIN) 10-40 MG tablet    Sig: TAKE 1 TABLET AT BEDTIME  Dispense:  90 tablet    Refill:  1  . pantoprazole (PROTONIX) 40 MG tablet    Sig: Take 1 tablet (40 mg total) by mouth daily.    Dispense:  30 tablet    Refill:  3    Problem List Items Addressed This Visit      Unprioritized   Essential hypertension    Well controlled, no changes to meds. Encouraged heart healthy diet such as the DASH diet and exercise as tolerated.       Relevant Medications   fenofibrate 160 MG tablet   ezetimibe-simvastatin (VYTORIN) 10-40 MG tablet   Other Relevant Orders   Lipid panel (Completed)   Hemoglobin A1c (Completed)   Comprehensive metabolic panel (Completed)   Microalbumin / creatinine urine ratio (Completed)   GERD   Relevant Medications   pantoprazole (PROTONIX) 40 MG tablet   Other Relevant Orders   Ambulatory referral to Gastroenterology   Hyperlipidemia LDL goal <70    Tolerating statin, encouraged heart healthy diet, avoid trans fats, minimize simple carbs and saturated fats. Increase exercise as tolerated      Relevant Medications   fenofibrate 160 MG tablet   ezetimibe-simvastatin (VYTORIN) 10-40 MG tablet   Other Relevant Orders   Lipid panel (Completed)   Comprehensive metabolic panel (Completed)   Morbid obesity ([place])    D/w pt diet and exercise       Type 2 diabetes mellitus with hyperglycemia, with long-term current use of insulin ([facility])    Uncontrolled Pt needs to check bs qid in  order to qualify for dexcom--- per endo Cont meds Check labs F/u endo       Uncontrolled type 2 diabetes mellitus with hyperglycemia ([place]) - Primary   Relevant Orders   Ambulatory referral to Endocrinology   Hemoglobin A1c (Completed)   Microalbumin / creatinine urine ratio (Completed)    Other Visit Diagnoses    Menopause       Relevant Medications   estradiol (ESTRACE) 1 MG tablet      Follow-up: Return in about 6 months (around 03/09/2020), or if symptoms worsen or fail to improve, for hypertension, hyperlipidemia, diabetes II.  [name], DO

## 2019-09-10 NOTE — Patient Instructions (Signed)
Carbohydrate Counting for Diabetes Mellitus, Adult  Carbohydrate counting is a method of keeping track of how many carbohydrates you eat. Eating carbohydrates naturally increases the amount of sugar (glucose) in the blood. Counting how many carbohydrates you eat helps keep your blood glucose within normal limits, which helps you manage your diabetes (diabetes mellitus). It is important to know how many carbohydrates you can safely have in each meal. This is different for every person. A diet and nutrition specialist (registered dietitian) can help you make a meal plan and calculate how many carbohydrates you should have at each meal and snack. Carbohydrates are found in the following foods:  Grains, such as breads and cereals.  Dried beans and soy products.  Starchy vegetables, such as potatoes, peas, and corn.  Fruit and fruit juices.  Milk and yogurt.  Sweets and snack foods, such as cake, cookies, candy, chips, and soft drinks. How do I count carbohydrates? There are two ways to count carbohydrates in food. You can use either of the methods or a combination of both. Reading "Nutrition Facts" on packaged food The "Nutrition Facts" list is included on the labels of almost all packaged foods and beverages in the [place] It includes:  The serving size.  Information about nutrients in each serving, including the grams (g) of carbohydrate per serving. To use the "Nutrition Facts":  Decide how many servings you will have.  Multiply the number of servings by the number of carbohydrates per serving.  The resulting number is the total amount of carbohydrates that you will be having. Learning standard serving sizes of other foods When you eat carbohydrate foods that are not packaged or do not include "Nutrition Facts" on the label, you need to measure the servings in order to count the amount of carbohydrates:  Measure the foods that you will eat with a food scale or measuring cup, if needed.   Decide how many standard-size servings you will eat.  Multiply the number of servings by 15. Most carbohydrate-rich foods have about 15 g of carbohydrates per serving. ? For example, if you eat 8 oz (170 g) of strawberries, you will have eaten 2 servings and 30 g of carbohydrates (2 servings x 15 g = 30 g).  For foods that have more than one food mixed, such as soups and casseroles, you must count the carbohydrates in each food that is included. The following list contains standard serving sizes of common carbohydrate-rich foods. Each of these servings has about 15 g of carbohydrates:   hamburger bun or  English muffin.   oz (15 mL) syrup.   oz (14 g) jelly.  1 slice of bread.  1 six-inch tortilla.  3 oz (85 g) cooked rice or pasta.  4 oz (113 g) cooked dried beans.  4 oz (113 g) starchy vegetable, such as peas, corn, or potatoes.  4 oz (113 g) hot cereal.  4 oz (113 g) mashed potatoes or  of a large baked potato.  4 oz (113 g) canned or frozen fruit.  4 oz (120 mL) fruit juice.  4-6 crackers.  6 chicken nuggets.  6 oz (170 g) unsweetened dry cereal.  6 oz (170 g) plain fat-free yogurt or yogurt sweetened with artificial sweeteners.  8 oz (240 mL) milk.  8 oz (170 g) fresh fruit or one small piece of fruit.  24 oz (680 g) popped popcorn. Example of carbohydrate counting Sample meal  3 oz (85 g) chicken breast.  6 oz (170 g)   brown rice.  4 oz (113 g) corn.  8 oz (240 mL) milk.  8 oz (170 g) strawberries with sugar-free whipped topping. Carbohydrate calculation 1. Identify the foods that contain carbohydrates: ? Rice. ? Corn. ? Milk. ? Strawberries. 2. Calculate how many servings you have of each food: ? 2 servings rice. ? 1 serving corn. ? 1 serving milk. ? 1 serving strawberries. 3. Multiply each number of servings by 15 g: ? 2 servings rice x 15 g = 30 g. ? 1 serving corn x 15 g = 15 g. ? 1 serving milk x 15 g = 15 g. ? 1 serving  strawberries x 15 g = 15 g. 4. Add together all of the amounts to find the total grams of carbohydrates eaten: ? 30 g + 15 g + 15 g + 15 g = 75 g of carbohydrates total. Summary  Carbohydrate counting is a method of keeping track of how many carbohydrates you eat.  Eating carbohydrates naturally increases the amount of sugar (glucose) in the blood.  Counting how many carbohydrates you eat helps keep your blood glucose within normal limits, which helps you manage your diabetes.  A diet and nutrition specialist (registered dietitian) can help you make a meal plan and calculate how many carbohydrates you should have at each meal and snack. This information is not intended to replace advice given to you by your health care provider. Make sure you discuss any questions you have with your health care provider. Document Released: 10/18/2005 Document Revised: 05/12/2017 Document Reviewed: 03/31/2016 Elsevier Patient Education  2020 Elsevier Inc.  

## 2019-09-11 ENCOUNTER — Telehealth: Payer: Self-pay | Admitting: *Deleted

## 2019-09-11 ENCOUNTER — Telehealth: Payer: Self-pay

## 2019-09-11 NOTE — Telephone Encounter (Signed)
-----   Message from Yvonne R Lowne Chase, DO sent at 09/10/2019  4:44 PM EST ----- Please let the pt know that she can not get dexcom unless she documents she checks her blood sugars 4x a day--- if she starts now and has record of it when she see Dr gherge --- she will be able to get her the dexcom--- it is a medicare rule  ----- Message ----- From: Gherghe, Cristina, MD Sent: 09/10/2019   2:46 PM EST To: Yvonne R Lowne Chase, DO  Hi Yvonne, She can qualify if she checks sugars 4x a day. If she does not do so, I can document this  when I see her next if she starts now. This is a horrible rule, I believe, but [employer] requests it... Cristina ----- Message ----- From: Lowne Chase, Yvonne R, DO Sent: 09/10/2019   2:14 PM EST To: Cristina Gherghe, MD  Hi cristina!  Hope you are well Ms fitzgibbons came in today.  Still not checking  blood sugars ---  she is very hesitant to prick her finger----  she really wants the dexcom---- does she qualify --- she does take humalog tid and lantus qd ----  I told her I did not know want the guidelines were for that.   I am putting a new referral in since it has been 2 years.   I'll get her labs today      thanks   Yvonne      

## 2019-09-11 NOTE — Telephone Encounter (Deleted)
-----   Message from [name], DO sent at 09/10/2019  4:44 PM EST ----- Please let the pt know that she can not get dexcom unless she documents she checks her blood sugars 4x a day--- if she starts now and has record of it when she see Dr Renne Crigler --- she will be able to get her the dexcom--- it is a medicare rule  ----- Message ----- From: Philemon Kingdom, MD Sent: 09/10/2019   2:46 PM EST To: [name], DO  Luvenia Starch, She can qualify if she checks sugars 4x a day. If she does not do so, I can document this  when I see her next if she starts now. This is a horrible rule, I believe, but [employer] requests it... Salena Saner ----- Message ----- From: [name], DO Sent: 09/10/2019   2:14 PM EST To: Philemon Kingdom, MD  Hi cristina!  Hope you are well Ms fitzgibbons came in today.  Still not checking  blood sugars ---  she is very hesitant to prick her finger----  she really wants the dexcom---- does she qualify --- she does take humalog tid and lantus qd ----  I told her I did not know want the guidelines were for that.   I am putting a new referral in since it has been 2 years.   I'll get her labs today      thanks   Kendrick Fries

## 2019-09-11 NOTE — Assessment & Plan Note (Signed)
D/w pt diet and exercise  

## 2019-09-11 NOTE — Assessment & Plan Note (Signed)
Uncontrolled Pt needs to check bs qid in order to qualify for dexcom--- per endo Cont meds Check labs F/u endo

## 2019-09-11 NOTE — Assessment & Plan Note (Signed)
Well controlled, no changes to meds. Encouraged heart healthy diet such as the DASH diet and exercise as tolerated.  °

## 2019-09-11 NOTE — Telephone Encounter (Signed)
Pt notified. Pt states she will contact Dr. Renne Crigler to set up appointment and will start checking sugars x4 times a day.

## 2019-09-11 NOTE — Telephone Encounter (Signed)
Error

## 2019-09-11 NOTE — Assessment & Plan Note (Signed)
Tolerating statin, encouraged heart healthy diet, avoid trans fats, minimize simple carbs and saturated fats. Increase exercise as tolerated 

## 2019-10-04 ENCOUNTER — Other Ambulatory Visit: Payer: Self-pay | Admitting: Family Medicine

## 2019-10-04 ENCOUNTER — Encounter: Payer: Self-pay | Admitting: Family Medicine

## 2019-10-04 DIAGNOSIS — E1165 Type 2 diabetes mellitus with hyperglycemia: Secondary | ICD-10-CM

## 2019-10-04 NOTE — Telephone Encounter (Signed)
Pt is supposed to be going back to endo -- Dr Cruzita Lederer

## 2019-10-09 ENCOUNTER — Encounter: Payer: Self-pay | Admitting: Family Medicine

## 2019-10-22 ENCOUNTER — Other Ambulatory Visit: Payer: Self-pay

## 2019-10-22 ENCOUNTER — Other Ambulatory Visit: Payer: Self-pay | Admitting: Family Medicine

## 2019-10-22 DIAGNOSIS — E1165 Type 2 diabetes mellitus with hyperglycemia: Secondary | ICD-10-CM

## 2019-10-23 ENCOUNTER — Encounter: Payer: Self-pay | Admitting: Internal Medicine

## 2019-10-23 ENCOUNTER — Ambulatory Visit (INDEPENDENT_AMBULATORY_CARE_PROVIDER_SITE_OTHER): Payer: Medicare Other | Admitting: Internal Medicine

## 2019-10-23 VITALS — BP 148/98 | HR 135 | Ht 63.0 in | Wt 268.0 lb

## 2019-10-23 DIAGNOSIS — E11319 Type 2 diabetes mellitus with unspecified diabetic retinopathy without macular edema: Secondary | ICD-10-CM

## 2019-10-23 DIAGNOSIS — Z794 Long term (current) use of insulin: Secondary | ICD-10-CM

## 2019-10-23 DIAGNOSIS — E785 Hyperlipidemia, unspecified: Secondary | ICD-10-CM

## 2019-10-23 MED ORDER — BASAGLAR KWIKPEN 100 UNIT/ML ~~LOC~~ SOPN: 60.0000 [IU] | PEN_INJECTOR | Freq: Every day | SUBCUTANEOUS | 3 refills | Status: DC

## 2019-10-23 MED ORDER — TRULICITY 3 MG/0.5ML ~~LOC~~ SOAJ: 3.0000 mg | SUBCUTANEOUS | 3 refills | Status: DC

## 2019-10-23 MED ORDER — HUMALOG KWIKPEN 200 UNIT/ML ~~LOC~~ SOPN: 20.0000 [IU] | PEN_INJECTOR | Freq: Three times a day (TID) | SUBCUTANEOUS | 3 refills | Status: DC

## 2019-10-23 NOTE — Progress Notes (Signed)
Patient ID: Andrea Marks, female   DOB: 02/28/1951, 68 y.o.   MRN: [id number]  This visit occurred during the SARS-CoV-2 public health emergency.  Safety protocols were in place, including screening questions prior to the visit, additional usage of staff PPE, and extensive cleaning of exam room while observing appropriate contact time as indicated for disinfecting solutions.   HPI: Andrea Marks is a 68 y.o.-year-old female, returning for f/u for DM2, dx 1994, insulin-dependent, uncontrolled, with complications (diabetic retinopathy). Last visit was 1.5 years ago.  She has a lot of stress in her life since last visit.  Her husband was recently dx'ed with lung cancer. She relaxed her diet and eats more fast foods - sugars much higher.  Reviewed previous HbA1c levels: Lab Results  Component Value Date   HGBA1C 10.5 (H) 09/10/2019   HGBA1C 10.4 (H) 12/15/2018   HGBA1C 7.5 (A) 05/18/2018   Pt is on: - Metformin 750 >> 850 mg in am and 1700 mg at night - Trulicity 1.5 mg weekly - Lantus 55 units at bedtime - need to change to Basaglar in the next year per insurance preference - Humalog 3x a day before meals - misses doses 15 units before a smaller meal 20 units before a larger meal Tried Bydureon once a week >> she had problems injecting the medication.  She also tried Victoza. She was on Humalog in the past. She was on Amaryl in the past, which we stopped going to increase her insulin doses 05/2018. She was on a [place] pump, which we stopped in 2019 as she needed higher doses of insulin.  She checks sugars 2-3 times a day: - am: 126-219 >> 145-271 >> 242-408 - 2h after b'fast: 161, 287 >> 202-238, 346 >> 214-426 - lunch: 181-312 >> 168-310 >> 250-404 - 2h after lunch: 269-365 >> 289 >> 169-388 - dinner: 203-382 >> 331 >> 235-420 - 2h after dinner: n/c >> 387 - bedtime: 239-329 >> n/c >> 205  Meals: - Breakfast: egg + bagel + yoghurt - Lunch: may skip, salad - Dinner:  salad, hamburger/hot dog, tuna fish - Snacks: potato chips, pretzel No sodas, only drinks sparkling water.  No CKD, last BUN/creatinine was:  Lab Results  Component Value Date   BUN 16 09/10/2019   CREATININE 0.85 09/10/2019  On benazepril + HL; Last set of lipids: Lab Results  Component Value Date   CHOL 114 09/10/2019   HDL 45.60 09/10/2019   LDLCALC 30 09/10/2019   LDLDIRECT 64.0 04/21/2017   TRIG 192.0 (H) 09/10/2019   CHOLHDL 3 09/10/2019  On Vytorin and fenofibrate. - Last eye exam: 09/2019: + DR -No numbness and tingling in her feet. She sees a Podiatrist - Dr. Mayer.  She had an ulcer in lower right leg (has lymphedema) >> healed.  She also has a history of HTN, GERD, depression, h/o thrombophlebitis.  She was on B12 injections - now on po B12 1000 mcg daily.  ROS: Constitutional: + Weight gain/+ weight loss, + fatigue, no subjective hyperthermia, no subjective hypothermia Eyes: no blurry vision, no xerophthalmia ENT: no sore throat, no nodules palpated in neck, no dysphagia, no odynophagia, no hoarseness Cardiovascular: no CP/no SOB/no palpitations/+ leg swelling Respiratory: no cough/no SOB/no wheezing Gastrointestinal: no N/no V/no D/no C/no acid reflux Musculoskeletal: no muscle aches/+ joint aches (knees, hands) Skin: no rashes, no hair loss Neurological: no tremors/no numbness/no tingling/no dizziness, + headache  I reviewed pt's medications, allergies, PMH, social hx, family hx, and changes were documented in   the history of present illness. Otherwise, unchanged from my initial visit note.  Past Medical History:  Diagnosis Date  . Depression   . Diabetes mellitus   . GERD (gastroesophageal reflux disease)   . Hyperlipidemia   . Hypertension   . Obesity    Past Surgical History:  Procedure Laterality Date  . ABDOMINAL HYSTERECTOMY  1991   BSO   Social History   Socioeconomic History  . Marital status: Married    Spouse name: Not on file  .  Number of children: 0  . Years of education: 13  . Highest education level: Not on file  Occupational History  . Occupation: retired from [employer]    Employer: RETIRED  Tobacco Use  . Smoking status: Former Smoker    Quit date: 1995    Years since quitting: 25.9  . Smokeless tobacco: Never Used  Substance and Sexual Activity  . Alcohol use: No  . Drug use: No  . Sexual activity: Yes    Partners: Male  Other Topics Concern  . Not on file  Social History Narrative   No reg exercise   Social Determinants of Health   Financial Resource Strain:   . Difficulty of Paying Living Expenses: Not on file  Food Insecurity:   . Worried About Running Out of Food in the Last Year: Not on file  . Ran Out of Food in the Last Year: Not on file  Transportation Needs:   . Lack of Transportation (Medical): Not on file  . Lack of Transportation (Non-Medical): Not on file  Physical Activity:   . Days of Exercise per Week: Not on file  . Minutes of Exercise per Session: Not on file  Stress:   . Feeling of Stress : Not on file  Social Connections:   . Frequency of Communication with Friends and Family: Not on file  . Frequency of Social Gatherings with Friends and Family: Not on file  . Attends Religious Services: Not on file  . Active Member of Clubs or Organizations: Not on file  . Attends Club or Organization Meetings: Not on file  . Marital Status: Not on file  Intimate Partner Violence:   . Fear of Current or Ex-Partner: Not on file  . Emotionally Abused: Not on file  . Physically Abused: Not on file  . Sexually Abused: Not on file   Current Outpatient Medications on File Prior to Visit  Medication Sig Dispense Refill  . acetaminophen (TYLENOL ARTHRITIS PAIN) 650 MG CR tablet Take 650 mg by mouth 2 (two) times daily as needed. For pain    . aspirin 81 MG tablet Take 81 mg by mouth daily.      . benazepril (LOTENSIN) 20 MG tablet TAKE 1 TABLET DAILY 90 tablet 3  . Blood Glucose  Monitoring Suppl (ONE TOUCH ULTRA SYSTEM KIT) W/DEVICE KIT 1 kit by Does not apply route once. 1 each 0  . estradiol (ESTRACE) 1 MG tablet Take 1 tablet (1 mg total) by mouth daily. 90 tablet 4  . ezetimibe-simvastatin (VYTORIN) 10-40 MG tablet TAKE 1 TABLET AT BEDTIME 90 tablet 1  . fenofibrate 160 MG tablet TAKE 1 TABLET DAILY (NEED OFFICE VISIT FOR GREATER QUANTITIES/REFILLS) 90 tablet 3  . Insulin Lispro (HUMALOG KWIKPEN) 200 UNIT/ML SOPN Inject 15-20 Units into the skin 3 (three) times daily before meals. 10 pen 5  . Insulin Pen Needle (NOVOFINE) 32G X 6 MM MISC To use w/ Lantus 100 each 3  . metFORMIN (GLUCOPHAGE)   850 MG tablet TAKE 1 TABLET EVERY MORNING AND TAKE 2 TABLETS EVERY EVENING 90 tablet 0  . pantoprazole (PROTONIX) 40 MG tablet Take 1 tablet (40 mg total) by mouth daily. 30 tablet 3  . TRULICITY 1.5 GN/5.6OZ SOPN INJECT 1.5 MG UNDER THE SKIN WEEKLY 6 mL 3  . Insulin Glargine (LANTUS SOLOSTAR) 100 UNIT/ML Solostar Pen 55 units under skin daily at Bedtime (Patient not taking: Reported on 10/23/2019) 10 pen 5   Current Facility-Administered Medications on File Prior to Visit  Medication Dose Route Frequency Provider Last Rate Last Admin  . ipratropium-albuterol (DUONEB) 0.5-2.5 (3) MG/3ML nebulizer solution 3 mL  3 mL Nebulization Q6H Copland, Jessica C, MD   3 mL at 11/15/16 1330   Allergies  Allergen Reactions  . Hydrocodone-Acetaminophen Other (See Comments)    unknown  . Oxycodone Hcl Other (See Comments)    unknown  . Penicillins Other (See Comments)    unknown  . Prednisone    Family History  Problem Relation Age of Onset  . Macular degeneration Mother   . Heart disease Mother        syncope  . Aortic stenosis Mother   . Lung disease Father 25       mesothelioma  . Cancer Maternal Aunt        stomach  . Heart disease Paternal Uncle        cabg  . Diabetes Paternal Uncle   . Heart disease Paternal Uncle   . Diabetes Paternal Uncle   . Diabetes Paternal Uncle    . Heart disease Paternal Uncle   . Heart disease Paternal Uncle   . Heart disease Paternal Uncle   . Lung cancer Unknown        asbestos  . Diabetes Paternal Grandmother   . Cancer Other        lung   PE: BP (!) 148/98   Pulse (!) 135   Ht 5' 3" (1.6 m)   Wt 268 lb (121.6 kg)   SpO2 97%   BMI 47.47 kg/m  Wt Readings from Last 3 Encounters:  10/23/19 268 lb (121.6 kg)  09/10/19 261 lb 9.6 oz (118.7 kg)  01/15/19 272 lb (123.4 kg)   Constitutional: overweight, in NAD Eyes: PERRLA, EOMI, no exophthalmos ENT: moist mucous membranes, no thyromegaly, no cervical lymphadenopathy Cardiovascular: Tachycardia, RR, No MRG, + bilateral lymphedema Respiratory: CTA B Gastrointestinal: abdomen soft, NT, ND, BS+ Musculoskeletal: no deformities, strength intact in all 4 Skin: moist, warm, + venous stasis rash bilaterally Neurological: no tremor with outstretched hands, DTR normal in all 4  ASSESSMENT: 1. DM2, insulin-dependent, uncontrolled, with complications - DR  2. Obesity class 3  3. HL  PLAN:  1. Patient with uncontrolled type 2 diabetes, worsened since our last visit.  She returns after long absence of 1 year and 62-monthafter.  Of increased stress in her life.  She also relaxed her diet and needs more and sugars are much higher. -She was previously on a VGo patch pump, but was switched to basal-bolus insulin regimen since she needed more insulin but also states it precluding her from sleeping on her side at night.  She is currently on the same insulin regimen as suggested at last visit and also the same dose of weekly GLP-1 receptor agonist.  Her Metformin dose was changed (elevated) recently.  She is tolerating the new dose well so we will continue it. -However, her sugars are very high, throughout the day, in the 200s  to 400s.  Therefore, at this visit, aside suggesting to improve her diet and eliminate fast foods, we will increase her basal insulin, rapid acting insulin, and  also the GLP-1 receptor agonist.  We will switch from Lantus to [employer] per insurance preference. - I advised her to:  Patient Instructions  Please continue: - Metformin 850 mg in am and 1700 mg at night  Please increase: - Trulicity to 3 mg weekly - Lantus/Basaglar 60-65 units at bedtime - Humalog 3x a day before meals 20 units before a small meal 25 units before a regular meal 30 units before a large meal  Please return in 2 months with your sugar log.  - advised to check sugars at different times of the day - 3-4x a day, rotating check times - advised for yearly eye exams >> she is UTD - return to clinic in 2 months   2. Obesity class 3  -Lost 11 pounds net since last visit -We will increase the Trulicity dose, which should also help with weight loss  3. HL -Reviewed latest lipid panel from 09/2019: LDL at goal, excellent, triglycerides slightly high, HDL at goal Lab Results  Component Value Date   CHOL 114 09/10/2019   HDL 45.60 09/10/2019   LDLCALC 30 09/10/2019   LDLDIRECT 64.0 04/21/2017   TRIG 192.0 (H) 09/10/2019   CHOLHDL 3 09/10/2019  -Continues Vytorin and fenofibrate without side effects  Philemon Kingdom, MD PhD [facility] Endocr[facility]

## 2019-10-23 NOTE — Patient Instructions (Addendum)
Please continue: - Metformin 850 mg in am and 1700 mg at night  Please increase: - Trulicity to 3 mg weekly - Lantus/Basaglar 60-65 units at bedtime - Humalog 3x a day before meals 20 units before a small meal 25 units before a regular meal 30 units before a large meal  Please return in 2 months with your sugar log.

## 2019-10-24 MED ORDER — ONETOUCH ULTRA VI STRP: ORAL_STRIP | 12 refills | Status: DC

## 2019-10-31 ENCOUNTER — Encounter: Payer: Self-pay | Admitting: Internal Medicine

## 2019-10-31 MED ORDER — ONETOUCH ULTRA VI STRP: ORAL_STRIP | 12 refills | Status: DC

## 2019-11-06 ENCOUNTER — Encounter: Payer: Self-pay | Admitting: Internal Medicine

## 2019-11-06 ENCOUNTER — Other Ambulatory Visit: Payer: Self-pay

## 2019-11-06 MED ORDER — BASAGLAR KWIKPEN 100 UNIT/ML ~~LOC~~ SOPN: 60.0000 [IU] | PEN_INJECTOR | Freq: Every day | SUBCUTANEOUS | 3 refills | Status: DC

## 2019-11-08 MED ORDER — ONETOUCH ULTRA VI STRP: ORAL_STRIP | 0 refills | Status: DC

## 2019-11-08 MED ORDER — HUMALOG KWIKPEN 200 UNIT/ML ~~LOC~~ SOPN: 20.0000 [IU] | PEN_INJECTOR | Freq: Three times a day (TID) | SUBCUTANEOUS | 3 refills | Status: DC

## 2019-11-08 MED ORDER — TRULICITY 3 MG/0.5ML ~~LOC~~ SOAJ: 3.0000 mg | SUBCUTANEOUS | 3 refills | Status: DC

## 2019-11-08 MED ORDER — BASAGLAR KWIKPEN 100 UNIT/ML ~~LOC~~ SOPN: 60.0000 [IU] | PEN_INJECTOR | Freq: Every day | SUBCUTANEOUS | 0 refills | Status: DC

## 2019-11-09 ENCOUNTER — Telehealth: Payer: Self-pay

## 2019-11-09 NOTE — Telephone Encounter (Signed)
Insurance does not cover [employer] prefers:  Programmer, systems  Please advise if you would like to change RX or do PA.

## 2019-11-11 NOTE — Telephone Encounter (Signed)
We can use Novolog

## 2019-11-12 MED ORDER — NOVOLOG FLEXPEN 100 UNIT/ML ~~LOC~~ SOPN: 20.0000 [IU] | PEN_INJECTOR | Freq: Three times a day (TID) | SUBCUTANEOUS | 1 refills | Status: DC

## 2019-11-12 NOTE — Telephone Encounter (Signed)
Novolog sent

## 2019-11-14 ENCOUNTER — Encounter: Payer: Self-pay | Admitting: Internal Medicine

## 2019-11-15 ENCOUNTER — Other Ambulatory Visit: Payer: Self-pay | Admitting: Internal Medicine

## 2019-11-15 MED ORDER — BASAGLAR KWIKPEN 100 UNIT/ML ~~LOC~~ SOPN: 70.0000 [IU] | PEN_INJECTOR | Freq: Every day | SUBCUTANEOUS | 3 refills | Status: DC

## 2019-11-21 MED ORDER — FREESTYLE LIBRE 14 DAY READER DEVI: 1.0000 | 0 refills | Status: DC

## 2019-11-21 MED ORDER — FREESTYLE LIBRE 14 DAY SENSOR MISC: 2 refills | Status: DC

## 2019-11-25 ENCOUNTER — Encounter: Payer: Self-pay | Admitting: Internal Medicine

## 2019-11-26 ENCOUNTER — Encounter: Payer: Self-pay | Admitting: Family Medicine

## 2019-11-26 DIAGNOSIS — K219 Gastro-esophageal reflux disease without esophagitis: Secondary | ICD-10-CM

## 2019-11-27 MED ORDER — PANTOPRAZOLE SODIUM 40 MG PO TBEC: 40.0000 mg | DELAYED_RELEASE_TABLET | Freq: Every day | ORAL | 3 refills | Status: DC

## 2019-11-28 ENCOUNTER — Other Ambulatory Visit: Payer: Self-pay

## 2019-11-28 ENCOUNTER — Encounter: Payer: Self-pay | Admitting: Podiatry

## 2019-11-28 ENCOUNTER — Ambulatory Visit (INDEPENDENT_AMBULATORY_CARE_PROVIDER_SITE_OTHER): Payer: Medicare Other | Admitting: Podiatry

## 2019-11-28 DIAGNOSIS — E1151 Type 2 diabetes mellitus with diabetic peripheral angiopathy without gangrene: Secondary | ICD-10-CM

## 2019-11-28 DIAGNOSIS — B351 Tinea unguium: Secondary | ICD-10-CM | POA: Diagnosis not present

## 2019-11-28 DIAGNOSIS — Z794 Long term (current) use of insulin: Secondary | ICD-10-CM | POA: Diagnosis not present

## 2019-11-28 DIAGNOSIS — M79676 Pain in unspecified toe(s): Secondary | ICD-10-CM | POA: Diagnosis not present

## 2019-11-28 NOTE — Progress Notes (Signed)
Patient ID: Andrea Marks, female   DOB: [birth year], 69 y.o.   MRN: [id number] Complaint:  Visit Type: Patient returns to my office for continued preventative foot care services. Complaint: Patient states" my nails have grown long and thick and become painful to walk and wear shoes".  Painful callus on bottom of both big toes. Patient has been diagnosed with DM with neuropathy.. The patient presents for preventative foot care services. No changes to ROS  Podiatric Exam: Vascular: dorsalis pedis and posterior tibial pulses are not palpable bilateral due to foot swelling.. Capillary return is immediate. Temperature gradient is WNL. Skin turgor WNL  Sensorium: Diminished Semmes Weinstein monofilament test. Normal tactile sensation bilaterally. Nail Exam: Pt has thick disfigured discolored nails with subungual debris noted bilateral entire nail hallux through fifth toenails Ulcer Exam: There is no evidence of ulcer or pre-ulcerative changes or infection. Orthopedic Exam: Muscle tone and strength are WNL. No limitations in general ROM. No crepitus or effusions noted. Foot type and digits show no abnormalities. Bony prominences are unremarkable. Skin: No Porokeratosis. No infection or ulcers.  Pinch callus hallux B/L.  Diagnosis:  Onychomycosis, , Pain in right toe, pain in left toes,    Treatment & Plan Procedures and Treatment: Consent by patient was obtained for treatment procedures. The patient understood the discussion of treatment and procedures well. All questions were answered thoroughly reviewed. Debridement of mycotic and hypertrophic toenails, 1 through 5 bilateral and clearing of subungual debris. No ulceration, no infection noted.  Return Visit-Office Procedure: Patient instructed to return to the office for a follow up visit 9 weeks  for continued evaluation and treatment.   Helane Gunther DPM

## 2019-11-30 ENCOUNTER — Telehealth: Payer: Self-pay

## 2019-11-30 MED ORDER — OZEMPIC (1 MG/DOSE) 2 MG/1.5ML ~~LOC~~ SOPN: 1.0000 mg | PEN_INJECTOR | SUBCUTANEOUS | 2 refills | Status: DC

## 2019-11-30 NOTE — Telephone Encounter (Signed)
Can we try to send Ozempic 1 mg weekly?

## 2019-11-30 NOTE — Telephone Encounter (Signed)
PA for Trulicity has been denied by insurance.  Denial letter sent for scanning.

## 2019-12-05 ENCOUNTER — Telehealth: Payer: Self-pay

## 2019-12-05 NOTE — Telephone Encounter (Signed)
Forms for ozempic PA filled out, signed by Dr. Elvera Lennox and faxed to insurance with confirmation.

## 2019-12-06 ENCOUNTER — Telehealth: Payer: Self-pay

## 2019-12-06 NOTE — Telephone Encounter (Signed)
Prior authorization for Ozempic  has been approved by patient's insurance.  Coverage is effective 12/05/2019 to 12/04/2022   Approval letter has been sent to scanning.

## 2019-12-10 ENCOUNTER — Other Ambulatory Visit: Payer: Self-pay | Admitting: Family Medicine

## 2019-12-10 DIAGNOSIS — E785 Hyperlipidemia, unspecified: Secondary | ICD-10-CM

## 2019-12-13 ENCOUNTER — Other Ambulatory Visit: Payer: Self-pay | Admitting: *Deleted

## 2019-12-13 ENCOUNTER — Encounter: Payer: Self-pay | Admitting: Internal Medicine

## 2019-12-13 ENCOUNTER — Encounter: Payer: Self-pay | Admitting: Family Medicine

## 2019-12-13 DIAGNOSIS — I10 Essential (primary) hypertension: Secondary | ICD-10-CM

## 2019-12-13 MED ORDER — BENAZEPRIL HCL 20 MG PO TABS: 20.0000 mg | ORAL_TABLET | Freq: Every day | ORAL | 3 refills | Status: DC

## 2019-12-24 ENCOUNTER — Telehealth: Payer: Self-pay

## 2019-12-24 NOTE — Telephone Encounter (Signed)
PA form received from [facility]. Form completed and faxed back to [phone]. Awaiting determination.

## 2019-12-25 NOTE — Telephone Encounter (Signed)
PA approved. Effective 12/24/2019 to 12/23/2020.

## 2019-12-27 ENCOUNTER — Encounter: Payer: Self-pay | Admitting: Internal Medicine

## 2019-12-31 NOTE — Telephone Encounter (Signed)
12/14/19 mychart message came back to [place] as unread. Mailed appt reminder to pt.

## 2020-01-01 ENCOUNTER — Encounter: Payer: Medicare Other | Attending: Internal Medicine | Admitting: Nutrition

## 2020-01-01 ENCOUNTER — Other Ambulatory Visit: Payer: Self-pay

## 2020-01-01 DIAGNOSIS — Z794 Long term (current) use of insulin: Secondary | ICD-10-CM

## 2020-01-01 DIAGNOSIS — E1165 Type 2 diabetes mellitus with hyperglycemia: Secondary | ICD-10-CM | POA: Diagnosis present

## 2020-01-01 NOTE — Patient Instructions (Signed)
Scan sensor at least every 8 hours. Change sensor every 14 days.   Call [place] help line if questions

## 2020-01-01 NOTE — Progress Notes (Signed)
Patient was trained on how to apply, read and and use the [place].  We discussed the difference between sensor readings and blood sugar readings, and she reported good understanding of this.  She applied the sensor to her upper outer right arm, and tried to use the [place] app on her phone, but it was not compatible to her [place] 2 sensors.  We then set up her reader to use to read the sensor.  She does not have a computer at home to download her reader.  She will bring it with her when she comes to her visit with Dr. Elvera Lennox on Friday.

## 2020-01-04 ENCOUNTER — Ambulatory Visit (INDEPENDENT_AMBULATORY_CARE_PROVIDER_SITE_OTHER): Payer: Medicare Other | Admitting: Internal Medicine

## 2020-01-04 ENCOUNTER — Other Ambulatory Visit: Payer: Self-pay

## 2020-01-04 ENCOUNTER — Encounter: Payer: Self-pay | Admitting: Internal Medicine

## 2020-01-04 VITALS — BP 128/80 | HR 87 | Ht 63.0 in | Wt 270.0 lb

## 2020-01-04 DIAGNOSIS — E785 Hyperlipidemia, unspecified: Secondary | ICD-10-CM | POA: Diagnosis not present

## 2020-01-04 DIAGNOSIS — Z794 Long term (current) use of insulin: Secondary | ICD-10-CM

## 2020-01-04 DIAGNOSIS — E11319 Type 2 diabetes mellitus with unspecified diabetic retinopathy without macular edema: Secondary | ICD-10-CM | POA: Diagnosis not present

## 2020-01-04 LAB — POCT GLYCOSYLATED HEMOGLOBIN (HGB A1C): Hemoglobin A1C: 7.3 % — AB (ref 4.0–5.6)

## 2020-01-04 MED ORDER — NOVOLOG FLEXPEN 100 UNIT/ML ~~LOC~~ SOPN: 15.0000 [IU] | PEN_INJECTOR | Freq: Three times a day (TID) | SUBCUTANEOUS | 1 refills | Status: DC

## 2020-01-04 MED ORDER — BASAGLAR KWIKPEN 100 UNIT/ML ~~LOC~~ SOPN: 60.0000 [IU] | PEN_INJECTOR | Freq: Every day | SUBCUTANEOUS | 3 refills | Status: DC

## 2020-01-04 NOTE — Progress Notes (Addendum)
Patient ID: Andrea Marks, female   DOB: 1951-09-24, 69 y.o.   MRN: [id number]  This visit occurred during the SARS-CoV-2 public health emergency.  Safety protocols were in place, including screening questions prior to the visit, additional usage of staff PPE, and extensive cleaning of exam room while observing appropriate contact time as indicated for disinfecting solutions.   HPI: Andrea Marks is a 69 y.o.-year-old female, returning for f/u for DM2, dx 1994, insulin-dependent, uncontrolled, with complications (diabetic retinopathy). Last visit was 2.5 months ago.  At last visit she returned after long absence with had very high blood sugars.  We changed her diabetes regimen then and discussed about the improvements in her diet.  Reviewed her HbA1c levels: Lab Results  Component Value Date   HGBA1C 10.5 (H) 09/10/2019   HGBA1C 10.4 (H) 12/15/2018   HGBA1C 7.5 (A) 05/18/2018   At last visit, she was on: - Metformin 750 >> 850 mg in am and 1700 mg at night - Trulicity 1.5 mg weekly - Lantus 55 units at bedtime - need to change to [place] in the next year per insurance preference - Humalog 3x a day before meals - misses doses 15 units before a smaller meal 20 units before a larger meal Tried Bydureon once a week >> she had problems injecting the medication.  She also tried Victoza. She was on Humalog in the past. She was on Amaryl in the past, which we stopped going to increase her insulin doses 05/2018. She was on a [place] pump, which we stopped in 2019 as she needed higher doses of insulin.  We changed to: - Metformin 850 mg in am and 1700 mg at night - Trulicity 1.5 mg (did not increase to 3 mg...) - will change to Ozempic 1 mg weekly after she runs out of Trulicity - Basaglar 27-25 units at bedtime - NovoLog 3x a day before meals  30 units before a large meal  She checks sugars 4 times a day with her libre 2 CGM - started 3 days ago:  Freestyle libre CGM  parameters: - Average: 131 - % active CGM time: 17% of the time - Glucose variability 30.2% (target < or = to 36%) - time in range:  - very low (<54): 2% - low (54-69): 9% - normal range (70-180): 79% - high sugars (181-250): 10% - very high sugars (>250): 0%  - am: 126-219 >> 145-271 >> 242-408 >> 126-194 - 2h after b'fast: 161, 287 >> 202-238, 346 >> 214-426 >> 178 - lunch: 181-312 >> 168-310 >> 250-404 >> 159, 166 - 2h after lunch: 269-365 >> 289 >> 169-388 >> n/c - dinner: 203-382 >> 331 >> 235-420 >> 69, 106, 109, 201 - 2h after dinner: n/c >> 387 >> 69, 77 - bedtime: 239-329 >> n/c >> 205  Meals: - Breakfast: egg + bagel + yoghurt - Lunch: may skip, salad - Dinner: salad, hamburger/hot dog, tuna fish - Snacks: potato chips, pretzel No sodas, only drinks sparkling water.  No CKD, last BUN/creatinine was:  Lab Results  Component Value Date   BUN 16 09/10/2019   CREATININE 0.85 09/10/2019  On benazepril. + HL; Last set of lipids: Lab Results  Component Value Date   CHOL 114 09/10/2019   HDL 45.60 09/10/2019   LDLCALC 30 09/10/2019   LDLDIRECT 64.0 04/21/2017   TRIG 192.0 (H) 09/10/2019   CHOLHDL 3 09/10/2019  On Vytorin and fenofibrate. - Last eye exam: 09/2019: ? DR - will have a new appt  in May -She denies numbness and tingling in her feet. She sees a Art therapist - Dr. Prudence Davidson.  She had an ulcer in lower right leg (has lymphedema) >> healed.  She also has a history of HTN, GERD, depression, h/o thrombophlebitis.  She was on B12 injections -she is currently on 1000 mcg p.o. B12 daily.  Before last visit, her husband was diagnosed with lung cancer.  ROS: Constitutional: no weight gain/no weight loss, + fatigue, no subjective hyperthermia, no subjective hypothermia Eyes: no blurry vision, no xerophthalmia ENT: no sore throat, no nodules palpated in neck, no dysphagia, no odynophagia, no hoarseness Cardiovascular: no CP/no SOB/no palpitations/no leg  swelling Respiratory: no cough/no SOB/no wheezing Gastrointestinal: no N/no V/no D/no C/no acid reflux Musculoskeletal: no muscle aches/+ joint aches Skin: no rashes, no hair loss Neurological: no tremors/no numbness/no tingling/no dizziness  I reviewed pt's medications, allergies, PMH, social hx, family hx, and changes were documented in the history of present illness. Otherwise, unchanged from my initial visit note.  Past Medical History:  Diagnosis Date  . Depression   . Diabetes mellitus   . GERD (gastroesophageal reflux disease)   . Hyperlipidemia   . Hypertension   . Obesity    Past Surgical History:  Procedure Laterality Date  . ABDOMINAL HYSTERECTOMY  1991   BSO   Social History   Socioeconomic History  . Marital status: Married    Spouse name: Not on file  . Number of children: 0  . Years of education: 94  . Highest education level: Not on file  Occupational History  . Occupation: retired from Surveyor, minerals: RETIRED  Tobacco Use  . Smoking status: Former Smoker    Quit date: 1995    Years since quitting: 26.1  . Smokeless tobacco: Never Used  Substance and Sexual Activity  . Alcohol use: No  . Drug use: No  . Sexual activity: Yes    Partners: Male  Other Topics Concern  . Not on file  Social History Narrative   No reg exercise   Social Determinants of Health   Financial Resource Strain:   . Difficulty of Paying Living Expenses: Not on file  Food Insecurity:   . Worried About Charity fundraiser in the Last Year: Not on file  . Ran Out of Food in the Last Year: Not on file  Transportation Needs:   . Lack of Transportation (Medical): Not on file  . Lack of Transportation (Non-Medical): Not on file  Physical Activity:   . Days of Exercise per Week: Not on file  . Minutes of Exercise per Session: Not on file  Stress:   . Feeling of Stress : Not on file  Social Connections:   . Frequency of Communication with Friends and Family: Not on  file  . Frequency of Social Gatherings with Friends and Family: Not on file  . Attends Religious Services: Not on file  . Active Member of Clubs or Organizations: Not on file  . Attends Archivist Meetings: Not on file  . Marital Status: Not on file  Intimate Partner Violence:   . Fear of Current or Ex-Partner: Not on file  . Emotionally Abused: Not on file  . Physically Abused: Not on file  . Sexually Abused: Not on file   Current Outpatient Medications on File Prior to Visit  Medication Sig Dispense Refill  . acetaminophen (TYLENOL ARTHRITIS PAIN) 650 MG CR tablet Take 650 mg by mouth 2 (two) times daily  as needed. For pain    . aspirin 81 MG tablet Take 81 mg by mouth daily.      . benazepril (LOTENSIN) 20 MG tablet Take 1 tablet (20 mg total) by mouth daily. 90 tablet 3  . Blood Glucose Monitoring Suppl (ONE TOUCH ULTRA SYSTEM KIT) W/DEVICE KIT 1 kit by Does not apply route once. 1 each 0  . Continuous Blood Gluc Receiver (FREESTYLE LIBRE 14 DAY READER) DEVI 1 Device by Does not apply route See admin instructions. Use for continuous monitor glucose 1 each 0  . Continuous Blood Gluc Sensor (FREESTYLE LIBRE 14 DAY SENSOR) MISC Apply new sensor every 14 days for continuous glucose monitoring. 6 each 2  . estradiol (ESTRACE) 1 MG tablet Take 1 tablet (1 mg total) by mouth daily. 90 tablet 4  . ezetimibe-simvastatin (VYTORIN) 10-40 MG tablet TAKE 1 TABLET AT BEDTIME 90 tablet 0  . fenofibrate 160 MG tablet TAKE 1 TABLET DAILY (NEED OFFICE VISIT FOR GREATER QUANTITIES/REFILLS) 90 tablet 3  . glucose blood (ONETOUCH ULTRA) test strip Use to check blood sugar 3 times a day 100 each 0  . insulin aspart (NOVOLOG FLEXPEN) 100 UNIT/ML FlexPen Inject 20-30 Units into the skin 3 (three) times daily with meals. 30 pen 1  . Insulin Glargine (BASAGLAR KWIKPEN) 100 UNIT/ML SOPN Inject 0.7 mLs (70 Units total) into the skin at bedtime. 25 pen 3  . Insulin Pen Needle (NOVOFINE) 32G X 6 MM MISC To  use w/ Lantus 100 each 3  . metFORMIN (GLUCOPHAGE) 850 MG tablet TAKE 1 TABLET EVERY MORNING AND TAKE 2 TABLETS EVERY EVENING 270 tablet 1  . pantoprazole (PROTONIX) 40 MG tablet Take 1 tablet (40 mg total) by mouth daily. 30 tablet 3  . Semaglutide, 1 MG/DOSE, (OZEMPIC, 1 MG/DOSE,) 2 MG/1.5ML SOPN Inject 1 mg into the skin once a week. 6 pen 2   Current Facility-Administered Medications on File Prior to Visit  Medication Dose Route Frequency Provider Last Rate Last Admin  . ipratropium-albuterol (DUONEB) 0.5-2.5 (3) MG/3ML nebulizer solution 3 mL  3 mL Nebulization Q6H Copland, Jessica C, MD   3 mL at 11/15/16 1330   Allergies  Allergen Reactions  . Hydrocodone-Acetaminophen Other (See Comments)    unknown  . Oxycodone Hcl Other (See Comments)    unknown  . Penicillins Other (See Comments)    unknown  . Prednisone    Family History  Problem Relation Age of Onset  . Macular degeneration Mother   . Heart disease Mother        syncope  . Aortic stenosis Mother   . Lung disease Father 72       mesothelioma  . Cancer Maternal Aunt        stomach  . Heart disease Paternal Uncle        cabg  . Diabetes Paternal Uncle   . Heart disease Paternal Uncle   . Diabetes Paternal Uncle   . Diabetes Paternal Uncle   . Heart disease Paternal Uncle   . Heart disease Paternal Uncle   . Heart disease Paternal Uncle   . Lung cancer Unknown        asbestos  . Diabetes Paternal Grandmother   . Cancer Other        lung   PE: BP 128/80   Pulse 87   Ht '5\' 3"'$  (1.6 m)   Wt 270 lb (122.5 kg)   BMI 47.83 kg/m  Wt Readings from Last 3 Encounters:  01/04/20 270  lb (122.5 kg)  10/23/19 268 lb (121.6 kg)  09/10/19 261 lb 9.6 oz (118.7 kg)   Constitutional: overweight, in NAD Eyes: PERRLA, EOMI, no exophthalmos ENT: moist mucous membranes, no thyromegaly, no cervical lymphadenopathy Cardiovascular: RRR, No MRG, + bilateral lymphedema Respiratory: CTA B Gastrointestinal: abdomen soft, NT,  ND, BS+ Musculoskeletal: no deformities, strength intact in all 4 Skin: moist, warm, + venous stasis rash bilaterally Neurological: no tremor with outstretched hands, DTR normal in all 4  ASSESSMENT: 1. DM2, insulin-dependent, uncontrolled, with complications - DR  2. Obesity class 3  3. HL  PLAN:  1. Patient with uncontrolled type 2 diabetes, insulin-dependent, worsened before last visit, when she returned after an absence of 1 year and 5 months.  At that time, she had a lot of stress in her life and she also relaxed her diet.  Sugars were in the 200s to 400s.  I did advise her to improve her diet by eliminating fast foods but we also increased her insulin doses and also her GLP-1 receptor agonist dose.  Trulicity was not covered so we will switch to Ozempic high-dose, 1 mg weekly.  We also had to switch from Lantus to [employer] per insurance preference. -In the past, she was on a Vgo patch pump but switch to basal-bolus insulin regimen since she needed more insulin but also she did not like the pump as it precluded her from sleeping on her side at night.  -At this visit her sugars improved significantly, to the point of lows in the second half of the day.  Therefore, for now, we will decrease her Basaglar and NovoLog doses but I did advise her to increase Trulicity to 3 mg weekly and then change to Ozempic 1 mg weekly when she runs out, since otherwise her sugars were stable above target. - I advised her to:  Patient Instructions  Please continue: - Metformin 850 mg in am and 1700 mg at night  Please increase: - Trulicity to 3 mg weekly and start Ozempic 1 mg weekly when you run out  Decrease - Basaglar 60 units at bedtime - NovoLog 3x a day before meals 15 units before a smaller meal 20 units before a larger meal  Please return in 3 months with your sugar log.  - we checked her HbA1c: 7.3% (much better) - advised to check sugars at different times of the day - 4x a day, rotating  check times - advised for yearly eye exams >> she is UTD - return to clinic in 3 months  2. Obesity class 3  -She lost 11 pounds and that before last visit -Continue GLP-1 receptor agonist which should also help with weight loss  3. HL -Reviewed her lipid panel from 09/2019: LDL at goal, excellent, triglycerides slightly high, HDL at goal: Lab Results  Component Value Date   CHOL 114 09/10/2019   HDL 45.60 09/10/2019   LDLCALC 30 09/10/2019   LDLDIRECT 64.0 04/21/2017   TRIG 192.0 (H) 09/10/2019   CHOLHDL 3 09/10/2019  -Continues Vytorin and fenofibrate without side effects  Philemon Kingdom, MD PhD [facility]

## 2020-01-04 NOTE — Patient Instructions (Addendum)
Please continue: - Metformin 850 mg in am and 1700 mg at night  Please increase: - Trulicity to 3 mg weekly and start Ozempic 1 mg weekly when you run out  Decrease - Basaglar 60 units at bedtime - NovoLog 3x a day before meals 15 units before a smaller meal 20 units before a larger meal  Please return in 3 months with your sugar log.

## 2020-01-05 ENCOUNTER — Encounter: Payer: Self-pay | Admitting: Internal Medicine

## 2020-01-06 ENCOUNTER — Ambulatory Visit: Payer: Medicare Other | Attending: Internal Medicine

## 2020-01-06 DIAGNOSIS — Z23 Encounter for immunization: Secondary | ICD-10-CM | POA: Insufficient documentation

## 2020-01-06 NOTE — Progress Notes (Signed)
   Covid-19 Vaccination Clinic  Name:  Divinity Kyler    MRN: [id number] DOB: [birth year]  01/06/2020  Ms. Schlatter was observed post Covid-19 immunization for 15 minutes without incident. She was provided with Vaccine Information Sheet and instruction to access the V-Safe system.   Ms. Whittlesey was instructed to call 911 with any severe reactions post vaccine: Marland Kitchen Difficulty breathing  . Swelling of face and throat  . A fast heartbeat  . A bad rash all over body  . Dizziness and weakness   Immunizations Administered    Name Date Dose VIS Date Route   Pfizer COVID-19 Vaccine 01/06/2020  8:11 AM 0.3 mL 10/12/2019 Intramuscular   Manufacturer: [employer], [employer]   Lot: [id number]   NDC: [address]-2

## 2020-01-07 ENCOUNTER — Other Ambulatory Visit: Payer: Self-pay

## 2020-01-30 ENCOUNTER — Ambulatory Visit: Payer: Medicare Other | Admitting: Podiatry

## 2020-02-06 ENCOUNTER — Ambulatory Visit: Payer: Medicare Other | Attending: Internal Medicine

## 2020-02-06 DIAGNOSIS — Z23 Encounter for immunization: Secondary | ICD-10-CM

## 2020-02-06 NOTE — Progress Notes (Signed)
   Covid-19 Vaccination Clinic  Name:  Romell Cavanah    MRN: [id number] DOB: [birth year]  02/06/2020  Ms. Agro was observed post Covid-19 immunization for 15 minutes without incident. She was provided with Vaccine Information Sheet and instruction to access the V-Safe system.   Ms. Riles was instructed to call 911 with any severe reactions post vaccine: Marland Kitchen Difficulty breathing  . Swelling of face and throat  . A fast heartbeat  . A bad rash all over body  . Dizziness and weakness   Immunizations Administered    Name Date Dose VIS Date Route   Pfizer COVID-19 Vaccine 02/06/2020  8:10 AM 0.3 mL 10/12/2019 Intramuscular   Manufacturer: [employer], [employer]   Lot: [id number]   NDC: [address]-1

## 2020-02-15 ENCOUNTER — Encounter: Payer: Self-pay | Admitting: Family Medicine

## 2020-02-15 ENCOUNTER — Other Ambulatory Visit: Payer: Self-pay

## 2020-02-15 ENCOUNTER — Ambulatory Visit (INDEPENDENT_AMBULATORY_CARE_PROVIDER_SITE_OTHER): Payer: Medicare Other | Admitting: Family Medicine

## 2020-02-15 DIAGNOSIS — M1711 Unilateral primary osteoarthritis, right knee: Secondary | ICD-10-CM

## 2020-02-15 NOTE — Patient Instructions (Signed)
Good to see you See me again in 6 weeks if no better we can inject We will get gel injection approved

## 2020-02-15 NOTE — Progress Notes (Signed)
[facility] [address] [place] Phone: [phone] Subjective:   I Andrea Marks am serving as a Education administrator for Dr. Hulan Saas.  This visit occurred during the SARS-CoV-2 public health emergency.  Safety protocols were in place, including screening questions prior to the visit, additional usage of staff PPE, and extensive cleaning of exam room while observing appropriate contact time as indicated for disinfecting solutions.   I'm seeing this patient by the request  of:  [name], DO  CC: Right knee pain  [id number]   01/15/2019 Monovisc given today.  Discussed icing regimen and home exercise.  Discussed which activities to do which was to avoid.  Discussed icing regimen and home exercises.  Discussed possibly using the brace on a more regular basis.  Follow-up with me again in 4 to 8 weeks  02/15/2020 Andrea Marks is a 69 y.o. female coming in with complaint of right knee pain. Patient states she would like to have a knee injection today. Patient received 2nd covid injeciton 02/06/2019.  Patient has been doing relatively well but now is the primary caregiver for her husband has been recently diagnosed with potential lung cancer.  He seems to be getting ill her since December.  Has had to do more activity around the house which is caused more swelling and pain in the right knee.  Increasing instability again.     Past Medical History:  Diagnosis Date  . Depression   . Diabetes mellitus   . GERD (gastroesophageal reflux disease)   . Hyperlipidemia   . Hypertension   . Obesity    Past Surgical History:  Procedure Laterality Date  . ABDOMINAL HYSTERECTOMY  1991   BSO   Social History   Socioeconomic History  . Marital status: Married    Spouse name: Not on file  . Number of children: 0  . Years of education: 25  . Highest education level: Not on file  Occupational History  . Occupation: retired from Landscape architect: RETIRED  Tobacco Use  . Smoking status: Former Smoker    Quit date: 1995    Years since quitting: 26.3  . Smokeless tobacco: Never Used  Substance and Sexual Activity  . Alcohol use: No  . Drug use: No  . Sexual activity: Yes    Partners: Male  Other Topics Concern  . Not on file  Social History Narrative   No reg exercise   Social Determinants of Health   Financial Resource Strain:   . Difficulty of Paying Living Expenses:   Food Insecurity:   . Worried About Charity fundraiser in the Last Year:   . Arboriculturist in the Last Year:   Transportation Needs:   . Film/video editor (Medical):   Marland Kitchen Lack of Transportation (Non-Medical):   Physical Activity:   . Days of Exercise per Week:   . Minutes of Exercise per Session:   Stress:   . Feeling of Stress :   Social Connections:   . Frequency of Communication with Friends and Family:   . Frequency of Social Gatherings with Friends and Family:   . Attends Religious Services:   . Active Member of Clubs or Organizations:   . Attends Archivist Meetings:   Marland Kitchen Marital Status:    Allergies  Allergen Reactions  . Hydrocodone-Acetaminophen Other (See Comments)    unknown  . Oxycodone Hcl Other (See Comments)  unknown  . Penicillins Other (See Comments)    unknown  . Prednisone    Family History  Problem Relation Age of Onset  . Macular degeneration Mother   . Heart disease Mother        syncope  . Aortic stenosis Mother   . Lung disease Father 52       mesothelioma  . Cancer Maternal Aunt        stomach  . Heart disease Paternal Uncle        cabg  . Diabetes Paternal Uncle   . Heart disease Paternal Uncle   . Diabetes Paternal Uncle   . Diabetes Paternal Uncle   . Heart disease Paternal Uncle   . Heart disease Paternal Uncle   . Heart disease Paternal Uncle   . Lung cancer Unknown        asbestos  . Diabetes Paternal Grandmother   . Cancer Other        lung    Current  Outpatient Medications (Endocrine & Metabolic):  .  estradiol (ESTRACE) 1 MG tablet, Take 1 tablet (1 mg total) by mouth daily. .  insulin aspart (NOVOLOG FLEXPEN) 100 UNIT/ML FlexPen, Inject 15-20 Units into the skin 3 (three) times daily with meals. .  Insulin Glargine (BASAGLAR KWIKPEN) 100 UNIT/ML, Inject 0.6 mLs (60 Units total) into the skin at bedtime. .  metFORMIN (GLUCOPHAGE) 850 MG tablet, TAKE 1 TABLET EVERY MORNING AND TAKE 2 TABLETS EVERY EVENING .  Semaglutide, 1 MG/DOSE, (OZEMPIC, 1 MG/DOSE,) 2 MG/1.5ML SOPN, Inject 1 mg into the skin once a week.   Current Outpatient Medications (Cardiovascular):  .  benazepril (LOTENSIN) 20 MG tablet, Take 1 tablet (20 mg total) by mouth daily. Marland Kitchen  ezetimibe-simvastatin (VYTORIN) 10-40 MG tablet, TAKE 1 TABLET AT BEDTIME .  fenofibrate 160 MG tablet, TAKE 1 TABLET DAILY (NEED OFFICE VISIT FOR GREATER QUANTITIES/REFILLS)    Current Facility-Administered Medications (Respiratory):  .  ipratropium-albuterol (DUONEB) 0.5-2.5 (3) MG/3ML nebulizer solution 3 mL  Current Outpatient Medications (Analgesics):  .  acetaminophen (TYLENOL ARTHRITIS PAIN) 650 MG CR tablet, Take 650 mg by mouth 2 (two) times daily as needed. For pain .  aspirin 81 MG tablet, Take 81 mg by mouth daily.       Current Outpatient Medications (Other):  .  Blood Glucose Monitoring Suppl (ONE TOUCH ULTRA SYSTEM KIT) W/DEVICE KIT, 1 kit by Does not apply route once. .  Continuous Blood Gluc Receiver (FREESTYLE LIBRE 14 DAY READER) DEVI, 1 Device by Does not apply route See admin instructions. Use for continuous monitor glucose .  Continuous Blood Gluc Sensor (FREESTYLE LIBRE 14 DAY SENSOR) MISC, Apply new sensor every 14 days for continuous glucose monitoring. Marland Kitchen  FLUoxetine (PROZAC) 20 MG tablet, Take 60 mg by mouth daily. Marland Kitchen  glucose blood (ONETOUCH ULTRA) test strip, Use to check blood sugar 3 times a day .  Insulin Pen Needle (NOVOFINE) 32G X 6 MM MISC, To use w/ Lantus .   pantoprazole (PROTONIX) 40 MG tablet, Take 1 tablet (40 mg total) by mouth daily.    Reviewed prior external information including notes and imaging from  primary care provider As well as notes that were available from care everywhere and other healthcare systems.  Past medical history, social, surgical and family history all reviewed in electronic medical record.  No pertanent information unless stated regarding to the chief complaint.   Review of Systems:  No headache, visual changes, nausea, vomiting, diarrhea, constipation, dizziness, abdominal pain, skin  rash, fevers, chills, night sweats, weight loss, swollen lymph nodes, body aches, joint swelling, chest pain, shortness of breath, mood changes. POSITIVE muscle aches  Objective  Blood pressure (!) 158/70, pulse 91, height _0  (1.6 m), weight 268 lb (121.6 kg), SpO2 97 %.   General: No apparent distress alert and oriented x3 mood and affect normal, dressed appropriately.  HEENT: Pupils equal, extraocular movements intact  Respiratory: Patient's speak in full sentences and does not appear short of breath  Cardiovascular: 2+ lower extremity edema, non tender, no erythema  Neuro: Cranial nerves II through XII are intact, neurovascularly intact in all extremities with 2+ DTRs and 2+ pulses.  Gait antalgic gait Knee: Right valgus deformity noted.  Abnormal thigh to calf ratio.  Tender to palpation over medial and PF joint line.  ROM full in flexion and extension and lower leg rotation. instability with valgus force.  painful patellar compression. Patellar glide with moderate crepitus. Patellar and quadriceps tendons unremarkable. Hamstring and quadriceps strength is normal. Contralateral knee shows moderate arthritic changes as well.  After informed written and verbal consent, patient was seated on exam table. Right knee was prepped with alcohol swab and utilizing anterolateral approach, patient's right knee space was injected  with 4:1  marcaine 0.5%: Kenalog 32m/dL. Patient tolerated the procedure well without immediate complications.   Impression and Recommendations:     This case required medical decision making of moderate complexity. The above documentation has been reviewed and is accurate and complete ZLyndal Pulley DO       Note: This dictation was prepared with Dragon dictation along with smaller phrase technology. Any transcriptional errors that result from this process are unintentional.

## 2020-02-15 NOTE — Assessment & Plan Note (Signed)
Chronic problem with exacerbation.  Discussed medication management.  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  Will follow up with me again in 6 weeks and will consider viscosupplementation again.  Otherwise we can continue the steroid injections every 10 to 12 weeks if needed.

## 2020-02-18 ENCOUNTER — Other Ambulatory Visit: Payer: Self-pay | Admitting: Family Medicine

## 2020-02-18 DIAGNOSIS — Z1231 Encounter for screening mammogram for malignant neoplasm of breast: Secondary | ICD-10-CM

## 2020-02-25 ENCOUNTER — Encounter: Payer: Self-pay | Admitting: Internal Medicine

## 2020-02-25 MED ORDER — FREESTYLE LIBRE 14 DAY SENSOR MISC: 2 refills | Status: DC

## 2020-03-02 ENCOUNTER — Encounter: Payer: Self-pay | Admitting: Internal Medicine

## 2020-03-06 ENCOUNTER — Other Ambulatory Visit: Payer: Self-pay | Admitting: Family Medicine

## 2020-03-06 ENCOUNTER — Encounter: Payer: Self-pay | Admitting: Internal Medicine

## 2020-03-06 DIAGNOSIS — Z780 Asymptomatic menopausal state: Secondary | ICD-10-CM

## 2020-03-06 DIAGNOSIS — K219 Gastro-esophageal reflux disease without esophagitis: Secondary | ICD-10-CM

## 2020-03-10 ENCOUNTER — Ambulatory Visit: Payer: Medicare Other | Admitting: Family Medicine

## 2020-03-11 ENCOUNTER — Ambulatory Visit (INDEPENDENT_AMBULATORY_CARE_PROVIDER_SITE_OTHER): Payer: Medicare Other | Admitting: Family Medicine

## 2020-03-11 ENCOUNTER — Encounter: Payer: Self-pay | Admitting: Family Medicine

## 2020-03-11 ENCOUNTER — Encounter: Payer: Self-pay | Admitting: Internal Medicine

## 2020-03-11 ENCOUNTER — Other Ambulatory Visit: Payer: Self-pay

## 2020-03-11 VITALS — BP 126/80 | HR 88 | Temp 97.1°F | Resp 18 | Ht 63.0 in | Wt 263.0 lb

## 2020-03-11 DIAGNOSIS — E785 Hyperlipidemia, unspecified: Secondary | ICD-10-CM

## 2020-03-11 DIAGNOSIS — E1165 Type 2 diabetes mellitus with hyperglycemia: Secondary | ICD-10-CM

## 2020-03-11 DIAGNOSIS — Z794 Long term (current) use of insulin: Secondary | ICD-10-CM

## 2020-03-11 DIAGNOSIS — E1169 Type 2 diabetes mellitus with other specified complication: Secondary | ICD-10-CM

## 2020-03-11 DIAGNOSIS — I10 Essential (primary) hypertension: Secondary | ICD-10-CM | POA: Diagnosis not present

## 2020-03-11 DIAGNOSIS — Z23 Encounter for immunization: Secondary | ICD-10-CM

## 2020-03-11 LAB — COMPREHENSIVE METABOLIC PANEL
ALT: 25 U/L (ref 0–35)
AST: 30 U/L (ref 0–37)
Albumin: 3.9 g/dL (ref 3.5–5.2)
Alkaline Phosphatase: 31 U/L — ABNORMAL LOW (ref 39–117)
BUN: 22 mg/dL (ref 6–23)
CO2: 29 mEq/L (ref 19–32)
Calcium: 9.8 mg/dL (ref 8.4–10.5)
Chloride: 100 mEq/L (ref 96–112)
Creatinine, Ser: 0.98 mg/dL (ref 0.40–1.20)
GFR: 56.26 mL/min — ABNORMAL LOW (ref >60.00)
Glucose, Bld: 174 mg/dL — ABNORMAL HIGH (ref 70–99)
Potassium: 4.9 mEq/L (ref 3.5–5.1)
Sodium: 135 mEq/L (ref 135–145)
Total Bilirubin: 0.5 mg/dL (ref 0.2–1.2)
Total Protein: 6.1 g/dL (ref 6.0–8.3)

## 2020-03-11 LAB — LIPID PANEL
Cholesterol: 113 mg/dL (ref 0–200)
HDL: 49.5 mg/dL (ref >39.00)
LDL Cholesterol: 39 mg/dL (ref 0–99)
NonHDL: 63.27
Total CHOL/HDL Ratio: 2
Triglycerides: 123 mg/dL (ref 0.0–149.0)
VLDL: 24.6 mg/dL (ref 0.0–40.0)

## 2020-03-11 MED ORDER — FENOFIBRATE 160 MG PO TABS: ORAL_TABLET | ORAL | 3 refills | Status: DC

## 2020-03-11 MED ORDER — EZETIMIBE-SIMVASTATIN 10-40 MG PO TABS: 1.0000 | ORAL_TABLET | Freq: Every day | ORAL | 1 refills | Status: DC

## 2020-03-11 MED ORDER — FREESTYLE LIBRE 14 DAY SENSOR MISC: 2 refills | Status: DC

## 2020-03-11 NOTE — Assessment & Plan Note (Signed)
Well controlled, no changes to meds. Encouraged heart healthy diet such as the DASH diet and exercise as tolerated.  °

## 2020-03-11 NOTE — Assessment & Plan Note (Signed)
Encouraged heart healthy diet, increase exercise, avoid trans fats, consider a krill oil cap daily 

## 2020-03-11 NOTE — Progress Notes (Signed)
Patient ID: Andrea Marks, female    DOB: 06-03-1951  Age: 69 y.o. MRN: [id number]    Subjective:  Subjective  HPI Andrea Marks presents for f/u bp and chol.  No complaints   Review of Systems  Constitutional: Negative for appetite change, diaphoresis, fatigue and unexpected weight change.  Eyes: Negative for pain, redness and visual disturbance.  Respiratory: Negative for cough, chest tightness, shortness of breath and wheezing.   Cardiovascular: Negative for chest pain, palpitations and leg swelling.  Endocrine: Negative for cold intolerance, heat intolerance, polydipsia, polyphagia and polyuria.  Genitourinary: Negative for difficulty urinating, dysuria and frequency.  Neurological: Negative for dizziness, light-headedness, numbness and headaches.    History Past Medical History:  Diagnosis Date  . Depression   . Diabetes mellitus   . GERD (gastroesophageal reflux disease)   . Hyperlipidemia   . Hypertension   . Obesity     She has a past surgical history that includes Abdominal hysterectomy (1991).   Her family history includes Aortic stenosis in her mother; Cancer in her maternal aunt and another family member; Diabetes in her paternal grandmother, paternal uncle, paternal uncle, and paternal uncle; Heart disease in her mother, paternal uncle, paternal uncle, paternal uncle, paternal uncle, and paternal uncle; Lung cancer in her unknown relative; Lung disease (age of onset: 68) in her father; Macular degeneration in her mother.She reports that she quit smoking about 26 years ago. She has never used smokeless tobacco. She reports that she does not drink alcohol or use drugs.  Current Outpatient Medications on File Prior to Visit  Medication Sig Dispense Refill  . acetaminophen (TYLENOL ARTHRITIS PAIN) 650 MG CR tablet Take 650 mg by mouth 2 (two) times daily as needed. For pain    . aspirin 81 MG tablet Take 81 mg by mouth daily.      . benazepril (LOTENSIN) 20 MG  tablet Take 1 tablet (20 mg total) by mouth daily. 90 tablet 3  . Blood Glucose Monitoring Suppl (ONE TOUCH ULTRA SYSTEM KIT) W/DEVICE KIT 1 kit by Does not apply route once. 1 each 0  . Continuous Blood Gluc Receiver (FREESTYLE LIBRE 14 DAY READER) DEVI 1 Device by Does not apply route See admin instructions. Use for continuous monitor glucose 1 each 0  . estradiol (ESTRACE) 1 MG tablet TAKE 1 TABLET BY MOUTH EVERY DAY 90 tablet 5  . FLUoxetine (PROZAC) 20 MG tablet Take 60 mg by mouth daily.    Marland Kitchen glucose blood (ONETOUCH ULTRA) test strip Use to check blood sugar 3 times a day 100 each 0  . insulin aspart (NOVOLOG FLEXPEN) 100 UNIT/ML FlexPen Inject 15-20 Units into the skin 3 (three) times daily with meals. 30 pen 1  . Insulin Glargine (BASAGLAR KWIKPEN) 100 UNIT/ML Inject 0.6 mLs (60 Units total) into the skin at bedtime. 25 pen 3  . Insulin Pen Needle (NOVOFINE) 32G X 6 MM MISC To use w/ Lantus 100 each 3  . metFORMIN (GLUCOPHAGE) 850 MG tablet TAKE 1 TABLET EVERY MORNING AND TAKE 2 TABLETS EVERY EVENING 270 tablet 1  . pantoprazole (PROTONIX) 40 MG tablet TAKE 1 TABLET BY MOUTH EVERY DAY 30 tablet 3  . Semaglutide, 1 MG/DOSE, (OZEMPIC, 1 MG/DOSE,) 2 MG/1.5ML SOPN Inject 1 mg into the skin once a week. 6 pen 2   Current Facility-Administered Medications on File Prior to Visit  Medication Dose Route Frequency Provider Last Rate Last Admin  . ipratropium-albuterol (DUONEB) 0.5-2.5 (3) MG/3ML nebulizer solution 3 mL  3 mL  Nebulization Q6H Copland, Gay Filler, MD   3 mL at 11/15/16 1330     Objective:  Objective  Physical Exam Vitals and nursing note reviewed.  Constitutional:      Appearance: She is well-developed.  HENT:     Head: Normocephalic and atraumatic.  Eyes:     Conjunctiva/sclera: Conjunctivae normal.  Neck:     Thyroid: No thyromegaly.     Vascular: No carotid bruit or JVD.  Cardiovascular:     Rate and Rhythm: Normal rate and regular rhythm.     Heart sounds: Normal  heart sounds. No murmur.  Pulmonary:     Effort: Pulmonary effort is normal. No respiratory distress.     Breath sounds: Normal breath sounds. No wheezing or rales.  Chest:     Chest wall: No tenderness.  Musculoskeletal:     Cervical back: Normal range of motion and neck supple.  Neurological:     Mental Status: She is alert and oriented to person, place, and time.    BP 126/80 (BP Location: Right Arm, Patient Position: Sitting, Cuff Size: Large)   Pulse 88   Temp (!) 97.1 F (36.2 C) (Temporal)   Resp 18   Ht '5\' 3"'$  (1.6 m)   Wt 263 lb (119.3 kg)   SpO2 97%   BMI 46.59 kg/m  Wt Readings from Last 3 Encounters:  03/11/20 263 lb (119.3 kg)  02/15/20 268 lb (121.6 kg)  01/04/20 270 lb (122.5 kg)     Lab Results  Component Value Date   WBC 9.8 12/15/2018   HGB 15.0 12/15/2018   HCT 46.3 (H) 12/15/2018   PLT 227.0 12/15/2018   GLUCOSE 174 (H) 03/11/2020   CHOL 113 03/11/2020   TRIG 123.0 03/11/2020   HDL 49.50 03/11/2020   LDLDIRECT 64.0 04/21/2017   LDLCALC 39 03/11/2020   ALT 25 03/11/2020   AST 30 03/11/2020   NA 135 03/11/2020   K 4.9 03/11/2020   CL 100 03/11/2020   CREATININE 0.98 03/11/2020   BUN 22 03/11/2020   CO2 29 03/11/2020   TSH 3.22 08/07/2015   INR 1.2 RATIO 04/19/2007   HGBA1C 7.3 (A) 01/04/2020   MICROALBUR 28.9 (H) 09/10/2019    DG Shoulder Left  Result Date: 05/29/2018 CLINICAL DATA:  Left shoulder pain after fall last month. EXAM: LEFT SHOULDER - 2+ VIEW COMPARISON:  None. FINDINGS: There is no evidence of fracture or dislocation. There is no evidence of arthropathy or other focal bone abnormality. Soft tissues are unremarkable. IMPRESSION: Normal left shoulder. Electronically Signed   By: Marijo Conception, M.D.   On: 05/29/2018 16:20     Assessment & Plan:  Plan  I have changed Andrea Marks's fenofibrate and ezetimibe-simvastatin. I am also having her maintain her acetaminophen, aspirin, ONE TOUCH ULTRA SYSTEM KIT, Insulin Pen  Needle, metFORMIN, OneTouch Ultra, FreeStyle Libre 14 Day Reader, Ozempic (1 MG/DOSE), benazepril, Basaglar KwikPen, NovoLOG FlexPen, FLUoxetine, pantoprazole, estradiol, and FreeStyle Libre 14 Day Sensor. We will continue to administer ipratropium-albuterol.  Meds ordered this encounter  Medications  . fenofibrate 160 MG tablet    Sig: 1 po qd    Dispense:  90 tablet    Refill:  3  . ezetimibe-simvastatin (VYTORIN) 10-40 MG tablet    Sig: Take 1 tablet by mouth at bedtime.    Dispense:  90 tablet    Refill:  1    Problem List Items Addressed This Visit      Unprioritized   Essential hypertension -  Primary    Well controlled, no changes to meds. Encouraged heart healthy diet such as the DASH diet and exercise as tolerated.       Relevant Medications   fenofibrate 160 MG tablet   ezetimibe-simvastatin (VYTORIN) 10-40 MG tablet   Other Relevant Orders   Lipid panel (Completed)   Comprehensive metabolic panel (Completed)   Hyperlipidemia associated with type 2 diabetes mellitus ([place])    Encouraged heart healthy diet, increase exercise, avoid trans fats, consider a krill oil cap daily      Relevant Medications   fenofibrate 160 MG tablet   ezetimibe-simvastatin (VYTORIN) 10-40 MG tablet   Hyperlipidemia LDL goal <70   Relevant Medications   fenofibrate 160 MG tablet   ezetimibe-simvastatin (VYTORIN) 10-40 MG tablet   Other Relevant Orders   Lipid panel (Completed)   Comprehensive metabolic panel (Completed)   Type 2 diabetes mellitus with hyperglycemia, with long-term current use of insulin ([place])    Per endo       Other Visit Diagnoses    Need for shingles vaccine       Relevant Orders   Varicella-zoster vaccine IM (Shingrix)      Follow-up: Return in about 6 months (around 09/11/2020), or if symptoms worsen or fail to improve, for hypertension, hyperlipidemia, annual exam, fasting.  [name], DO

## 2020-03-11 NOTE — Patient Instructions (Signed)
DASH Eating Plan DASH stands for "Dietary Approaches to Stop Hypertension." The DASH eating plan is a healthy eating plan that has been shown to reduce high blood pressure (hypertension). It may also reduce your risk for type 2 diabetes, heart disease, and stroke. The DASH eating plan may also help with weight loss. What are tips for following this plan?  General guidelines  Avoid eating more than 2,300 mg (milligrams) of salt (sodium) a day. If you have hypertension, you may need to reduce your sodium intake to 1,500 mg a day.  Limit alcohol intake to no more than 1 drink a day for nonpregnant women and 2 drinks a day for men. One drink equals 12 oz of beer, 5 oz of wine, or 1 oz of hard liquor.  Work with your health care provider to maintain a healthy body weight or to lose weight. Ask what an ideal weight is for you.  Get at least 30 minutes of exercise that causes your heart to beat faster (aerobic exercise) most days of the week. Activities may include walking, swimming, or biking.  Work with your health care provider or diet and nutrition specialist (dietitian) to adjust your eating plan to your individual calorie needs. Reading food labels   Check food labels for the amount of sodium per serving. Choose foods with less than 5 percent of the Daily Value of sodium. Generally, foods with less than 300 mg of sodium per serving fit into this eating plan.  To find whole grains, look for the word "whole" as the first word in the ingredient list. Shopping  Buy products labeled as "low-sodium" or "no salt added."  Buy fresh foods. Avoid canned foods and premade or frozen meals. Cooking  Avoid adding salt when cooking. Use salt-free seasonings or herbs instead of table salt or sea salt. Check with your health care provider or pharmacist before using salt substitutes.  Do not fry foods. Cook foods using healthy methods such as baking, boiling, grilling, and broiling instead.  Cook with  heart-healthy oils, such as olive, canola, soybean, or sunflower oil. Meal planning  Eat a balanced diet that includes: ? 5 or more servings of fruits and vegetables each day. At each meal, try to fill half of your plate with fruits and vegetables. ? Up to 6-8 servings of whole grains each day. ? Less than 6 oz of lean meat, poultry, or fish each day. A 3-oz serving of meat is about the same size as a deck of cards. One egg equals 1 oz. ? 2 servings of low-fat dairy each day. ? A serving of nuts, seeds, or beans 5 times each week. ? Heart-healthy fats. Healthy fats called Omega-3 fatty acids are found in foods such as flaxseeds and coldwater fish, like sardines, salmon, and mackerel.  Limit how much you eat of the following: ? Canned or prepackaged foods. ? Food that is high in trans fat, such as fried foods. ? Food that is high in saturated fat, such as fatty meat. ? Sweets, desserts, sugary drinks, and other foods with added sugar. ? Full-fat dairy products.  Do not salt foods before eating.  Try to eat at least 2 vegetarian meals each week.  Eat more home-cooked food and less restaurant, buffet, and fast food.  When eating at a restaurant, ask that your food be prepared with less salt or no salt, if possible. What foods are recommended? The items listed may not be a complete list. Talk with your dietitian about   what dietary choices are best for you. Grains Whole-grain or whole-wheat bread. Whole-grain or whole-wheat pasta. Brown rice. Oatmeal. Quinoa. Bulgur. Whole-grain and low-sodium cereals. Pita bread. Low-fat, low-sodium crackers. Whole-wheat flour tortillas. Vegetables Fresh or frozen vegetables (raw, steamed, roasted, or grilled). Low-sodium or reduced-sodium tomato and vegetable juice. Low-sodium or reduced-sodium tomato sauce and tomato paste. Low-sodium or reduced-sodium canned vegetables. Fruits All fresh, dried, or frozen fruit. Canned fruit in natural juice (without  added sugar). Meat and other protein foods Skinless chicken or turkey. Ground chicken or turkey. Pork with fat trimmed off. Fish and seafood. Egg whites. Dried beans, peas, or lentils. Unsalted nuts, nut butters, and seeds. Unsalted canned beans. Lean cuts of beef with fat trimmed off. Low-sodium, lean deli meat. Dairy Low-fat (1%) or fat-free (skim) milk. Fat-free, low-fat, or reduced-fat cheeses. Nonfat, low-sodium ricotta or cottage cheese. Low-fat or nonfat yogurt. Low-fat, low-sodium cheese. Fats and oils Soft margarine without trans fats. Vegetable oil. Low-fat, reduced-fat, or light mayonnaise and salad dressings (reduced-sodium). Canola, safflower, olive, soybean, and sunflower oils. Avocado. Seasoning and other foods Herbs. Spices. Seasoning mixes without salt. Unsalted popcorn and pretzels. Fat-free sweets. What foods are not recommended? The items listed may not be a complete list. Talk with your dietitian about what dietary choices are best for you. Grains Baked goods made with fat, such as croissants, muffins, or some breads. Dry pasta or rice meal packs. Vegetables Creamed or fried vegetables. Vegetables in a cheese sauce. Regular canned vegetables (not low-sodium or reduced-sodium). Regular canned tomato sauce and paste (not low-sodium or reduced-sodium). Regular tomato and vegetable juice (not low-sodium or reduced-sodium). Pickles. Olives. Fruits Canned fruit in a light or heavy syrup. Fried fruit. Fruit in cream or butter sauce. Meat and other protein foods Fatty cuts of meat. Ribs. Fried meat. Bacon. Sausage. Bologna and other processed lunch meats. Salami. Fatback. Hotdogs. Bratwurst. Salted nuts and seeds. Canned beans with added salt. Canned or smoked fish. Whole eggs or egg yolks. Chicken or turkey with skin. Dairy Whole or 2% milk, cream, and half-and-half. Whole or full-fat cream cheese. Whole-fat or sweetened yogurt. Full-fat cheese. Nondairy creamers. Whipped toppings.  Processed cheese and cheese spreads. Fats and oils Butter. Stick margarine. Lard. Shortening. Ghee. Bacon fat. Tropical oils, such as coconut, palm kernel, or palm oil. Seasoning and other foods Salted popcorn and pretzels. Onion salt, garlic salt, seasoned salt, table salt, and sea salt. Worcestershire sauce. Tartar sauce. Barbecue sauce. Teriyaki sauce. Soy sauce, including reduced-sodium. Steak sauce. Canned and packaged gravies. Fish sauce. Oyster sauce. Cocktail sauce. Horseradish that you find on the shelf. Ketchup. Mustard. Meat flavorings and tenderizers. Bouillon cubes. Hot sauce and Tabasco sauce. Premade or packaged marinades. Premade or packaged taco seasonings. Relishes. Regular salad dressings. Where to find more information:  National Heart, Lung, and Blood Institute: www.nhlbi.nih.gov  American Heart Association: www.heart.org Summary  The DASH eating plan is a healthy eating plan that has been shown to reduce high blood pressure (hypertension). It may also reduce your risk for type 2 diabetes, heart disease, and stroke.  With the DASH eating plan, you should limit salt (sodium) intake to 2,300 mg a day. If you have hypertension, you may need to reduce your sodium intake to 1,500 mg a day.  When on the DASH eating plan, aim to eat more fresh fruits and vegetables, whole grains, lean proteins, low-fat dairy, and heart-healthy fats.  Work with your health care provider or diet and nutrition specialist (dietitian) to adjust your eating plan to your   individual calorie needs. This information is not intended to replace advice given to you by your health care provider. Make sure you discuss any questions you have with your health care provider. Document Revised: 09/30/2017 Document Reviewed: 10/11/2016 Elsevier Patient Education  2020 [employer]  

## 2020-03-11 NOTE — Assessment & Plan Note (Signed)
Per endo °

## 2020-03-12 ENCOUNTER — Encounter: Payer: Self-pay | Admitting: Family Medicine

## 2020-03-14 ENCOUNTER — Other Ambulatory Visit: Payer: Self-pay | Admitting: Family Medicine

## 2020-03-14 DIAGNOSIS — E1165 Type 2 diabetes mellitus with hyperglycemia: Secondary | ICD-10-CM

## 2020-03-19 ENCOUNTER — Other Ambulatory Visit: Payer: Self-pay

## 2020-03-19 ENCOUNTER — Encounter: Payer: Self-pay | Admitting: Podiatry

## 2020-03-19 ENCOUNTER — Ambulatory Visit (INDEPENDENT_AMBULATORY_CARE_PROVIDER_SITE_OTHER): Payer: Medicare Other | Admitting: Podiatry

## 2020-03-19 DIAGNOSIS — L84 Corns and callosities: Secondary | ICD-10-CM

## 2020-03-19 DIAGNOSIS — M79676 Pain in unspecified toe(s): Secondary | ICD-10-CM

## 2020-03-19 DIAGNOSIS — E1151 Type 2 diabetes mellitus with diabetic peripheral angiopathy without gangrene: Secondary | ICD-10-CM | POA: Diagnosis not present

## 2020-03-19 DIAGNOSIS — B351 Tinea unguium: Secondary | ICD-10-CM

## 2020-03-19 DIAGNOSIS — Z794 Long term (current) use of insulin: Secondary | ICD-10-CM | POA: Diagnosis not present

## 2020-03-19 NOTE — Progress Notes (Signed)
This patient returns to my office for at risk foot care.  This patient requires this care by a professional since this patient will be at risk due to having diabetes and history of thrombophlebitis.  This patient is unable to cut nails herself since the patient cannot reach her nails.These nails are painful walking and wearing shoes.  This patient presents for at risk foot care today.  General Appearance  Alert, conversant and in no acute stress.  Vascular  Dorsalis pedis and posterior tibial  pulses are not  palpable  Bilaterally  Due to swelling..  Capillary return is within normal limits  bilaterally. Temperature is within normal limits  bilaterally.  Neurologic  Senn-Weinstein monofilament wire test diminished  bilaterally. Muscle power within normal limits bilaterally.  Nails Thick disfigured discolored nails with subungual debris  from hallux to fifth toes bilaterally. No evidence of bacterial infection or drainage bilaterally.  Orthopedic  No limitations of motion  feet .  No crepitus or effusions noted.  No bony pathology or digital deformities noted.  Skin  normotropic skin with no porokeratosis noted bilaterally.  No signs of infections or ulcers noted.   Pinch callus  B/L.  Onychomycosis  Pain in right toes  Pain in left toes  Consent was obtained for treatment procedures.   Mechanical debridement of nails 1-5  bilaterally performed with a nail nipper.  Filed with dremel without incident.    Return office visit   9 weeks                  Told patient to return for periodic foot care and evaluation due to potential at risk complications.   Helane Gunther DPM

## 2020-03-20 ENCOUNTER — Other Ambulatory Visit: Payer: Self-pay | Admitting: Internal Medicine

## 2020-03-28 ENCOUNTER — Ambulatory Visit (INDEPENDENT_AMBULATORY_CARE_PROVIDER_SITE_OTHER): Payer: Medicare Other | Admitting: Family Medicine

## 2020-03-28 ENCOUNTER — Other Ambulatory Visit: Payer: Self-pay

## 2020-03-28 ENCOUNTER — Encounter: Payer: Self-pay | Admitting: Family Medicine

## 2020-03-28 DIAGNOSIS — M1711 Unilateral primary osteoarthritis, right knee: Secondary | ICD-10-CM

## 2020-03-28 NOTE — Progress Notes (Signed)
Andrea Marks D.O. [facility] [address] [place] [address] Phone: [phone] Subjective:   I, Andrea Marks, am serving as a scribe for Dr. Zachary Marks. This visit occurred during the SARS-CoV-2 public health emergency.  Safety protocols were in place, including screening questions prior to the visit, additional usage of staff PPE, and extensive cleaning of exam room while observing appropriate contact time as indicated for disinfecting solutions.   I'm seeing this patient by the request  of:  Andrea Marks, Andrea R, DO  CC: Knee pain follow-up  HPI:Subjective   02/15/2020 Chronic problem with exacerbation.  Discussed medication management.  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  Will follow up with me again in 6 weeks and will consider viscosupplementation again.  Otherwise we can continue the steroid injections every 10 to 12 weeks if needed.  Update 03/28/2020 Andrea Marks is a 69 y.o. female coming in with complaint of right knee pain. Durolane approved. Patient states pain has come back. Burning in anterior aspect.  Patient is having increasing discomfort and some mild increase in instability.     Past Medical History:  Diagnosis Date  . Depression   . Diabetes mellitus   . GERD (gastroesophageal reflux disease)   . Hyperlipidemia   . Hypertension   . Obesity    Past Surgical History:  Procedure Laterality Date  . ABDOMINAL HYSTERECTOMY  1991   BSO   Social History   Socioeconomic History  . Marital status: Married    Spouse name: Not on file  . Number of children: 0  . Years of education: 13  . Highest education level: Not on file  Occupational History  . Occupation: retired from [employer]    Employer: RETIRED  Tobacco Use  . Smoking status: Former Smoker    Quit date: 1995    Years since quitting: 26.4  .  Smokeless tobacco: Never Used  Substance and Sexual Activity  . Alcohol use: No  . Drug use: No  . Sexual activity: Yes    Partners: Male  Other Topics Concern  . Not on file  Social History Narrative   No reg exercise   Social Determinants of Health   Financial Resource Strain:   . Difficulty of Paying Living Expenses:   Food Insecurity:   . Worried About Running Out of Food in the Last Year:   . Ran Out of Food in the Last Year:   Transportation Needs:   . Lack of Transportation (Medical):   . Lack of Transportation (Non-Medical):   Physical Activity:   . Days of Exercise per Week:   . Minutes of Exercise per Session:   Stress:   . Feeling of Stress :   Social Connections:   . Frequency of Communication with Friends and Family:   . Frequency of Social Gatherings with Friends and Family:   . Attends Religious Services:   . Active Member of Clubs or Organizations:   . Attends Club or Organization Meetings:   . Marital Status:    Allergies  Allergen Reactions  . Hydrocodone-Acetaminophen Other (See Comments)    unknown  . Oxycodone Hcl Other (See Comments)    unknown  . Penicillins Other (See Comments)    unknown  . Prednisone    Family History  Problem Relation Age of Onset  . Macular degeneration Mother   . Heart disease Mother          syncope  . Aortic stenosis Mother   . Lung disease Father 85       mesothelioma  . Cancer Maternal Aunt        stomach  . Heart disease Paternal Uncle        cabg  . Diabetes Paternal Uncle   . Heart disease Paternal Uncle   . Diabetes Paternal Uncle   . Diabetes Paternal Uncle   . Heart disease Paternal Uncle   . Heart disease Paternal Uncle   . Heart disease Paternal Uncle   . Lung cancer Unknown        asbestos  . Diabetes Paternal Grandmother   . Cancer Other        lung    Current Outpatient Medications (Endocrine & Metabolic):  .  estradiol (ESTRACE) 1 MG tablet, TAKE 1 TABLET BY MOUTH EVERY DAY .  insulin  aspart (NOVOLOG FLEXPEN) 100 UNIT/ML FlexPen, Inject 20-30 Units into the skin 3 (three) times daily with meals. .  Insulin Glargine (BASAGLAR KWIKPEN) 100 UNIT/ML, Inject 0.6 mLs (60 Units total) into the skin at bedtime. .  metFORMIN (GLUCOPHAGE) 850 MG tablet, TAKE 1 TABLET EVERY MORNING AND TAKE 2 TABLETS EVERY EVENING .  Semaglutide, 1 MG/DOSE, (OZEMPIC, 1 MG/DOSE,) 2 MG/1.5ML SOPN, Inject 1 mg into the skin once a week.   Current Outpatient Medications (Cardiovascular):  .  benazepril (LOTENSIN) 20 MG tablet, Take 1 tablet (20 mg total) by mouth daily. .  ezetimibe-simvastatin (VYTORIN) 10-40 MG tablet, Take 1 tablet by mouth at bedtime. .  fenofibrate 160 MG tablet, 1 po qd    Current Facility-Administered Medications (Respiratory):  .  ipratropium-albuterol (DUONEB) 0.5-2.5 (3) MG/3ML nebulizer solution 3 mL  Current Outpatient Medications (Analgesics):  .  acetaminophen (TYLENOL ARTHRITIS PAIN) 650 MG CR tablet, Take 650 mg by mouth 2 (two) times daily as needed. For pain .  aspirin 81 MG tablet, Take 81 mg by mouth daily.       Current Outpatient Medications (Other):  .  Blood Glucose Monitoring Suppl (ONE TOUCH ULTRA SYSTEM KIT) W/DEVICE KIT, 1 kit by Does not apply route once. .  Continuous Blood Gluc Receiver (FREESTYLE LIBRE 14 DAY READER) DEVI, 1 Device by Does not apply route See admin instructions. Use for continuous monitor glucose .  Continuous Blood Gluc Sensor (FREESTYLE LIBRE 14 DAY SENSOR) MISC, Apply new sensor every 14 days for continuous glucose monitoring. .  famotidine (PEPCID) 20 MG tablet, Take 20 mg by mouth 2 (two) times daily. .  FLUoxetine (PROZAC) 20 MG tablet, Take 60 mg by mouth daily. .  glucose blood (ONETOUCH ULTRA) test strip, Use to check blood sugar 3 times a day .  Insulin Pen Needle (NOVOFINE) 32G X 6 MM MISC, To use w/ Lantus .  pantoprazole (PROTONIX) 40 MG tablet, TAKE 1 TABLET BY MOUTH EVERY DAY    Reviewed prior external information  including notes and imaging from  primary care provider As well as notes that were available from care everywhere and other healthcare systems.  Past medical history, social, surgical and family history all reviewed in electronic medical record.  No pertanent information unless stated regarding to the chief complaint.   Review of Systems:  No headache, visual changes, nausea, vomiting, diarrhea, constipation, dizziness, abdominal pain, skin rash, fevers, chills, night sweats, weight loss, swollen lymph nodes, body aches, chest pain, shortness of breath, mood changes. POSITIVE muscle aches, joint swelling  Objective  Blood pressure 128/72, pulse 94, height 5' 3" (1.6   m), weight 267 lb (121.1 kg), SpO2 98 %.   General: No apparent distress alert and oriented x3 mood and affect normal, dressed appropriately.  Overweight HEENT: Pupils equal, extraocular movements intact  Respiratory: Patient's speak in full sentences and does not appear short of breath  Cardiovascular: No lower extremity edema, non tender, no erythema  Neuro: Cranial nerves II through XII are intact, neurovascularly intact in all extremities with 2+ DTRs and 2+ pulses.  Gait normal with good balance and coordination.  MSK: Knee: Right valgus deformity noted.  Abnormal thigh to calf ratio.  Tender to palpation over medial and PF joint line.  ROM full in flexion and extension and lower leg rotation. instability with valgus force.  painful patellar compression. Patellar glide with moderate crepitus. Patellar and quadriceps tendons unremarkable. Hamstring and quadriceps strength is normal.  After informed written and verbal consent, patient was seated on exam table. Right knee was prepped with alcohol swab and utilizing anterolateral approach, patient's right knee space was injected with 60 mg per 3 mL of Durolane (sodium hyaluronate) in a prefilled syringe was injected easily into the knee through a 22-gauge needle..Patient  tolerated the procedure well without immediate complications.    Impression and Recommendations:     This case required medical decision making of moderate complexity. The above documentation has been reviewed and is accurate and complete Andrea M Smith, DO       Note: This dictation was prepared with Dragon dictation along with smaller phrase technology. Any transcriptional errors that result from this process are unintentional.          

## 2020-03-28 NOTE — Assessment & Plan Note (Signed)
Arthritic changes noted.  Chronic problem with exacerbation.  Viscosupplementation given today.  Has tolerated the procedure previously as well and has had good resolution results.  Hopefully this will continue.  Encourage weight loss, icing regimen and topical anti-inflammatories.  Follow-up again in 6 weeks if worsening symptoms otherwise follow-up as needed

## 2020-03-28 NOTE — Patient Instructions (Addendum)
Gel injection today.  

## 2020-04-07 ENCOUNTER — Ambulatory Visit: Payer: Medicare Other | Admitting: Internal Medicine

## 2020-04-14 ENCOUNTER — Ambulatory Visit (INDEPENDENT_AMBULATORY_CARE_PROVIDER_SITE_OTHER): Payer: Medicare Other | Admitting: Internal Medicine

## 2020-04-14 ENCOUNTER — Encounter: Payer: Self-pay | Admitting: Internal Medicine

## 2020-04-14 ENCOUNTER — Other Ambulatory Visit: Payer: Self-pay

## 2020-04-14 VITALS — BP 160/80 | HR 105 | Ht 63.0 in | Wt 266.0 lb

## 2020-04-14 DIAGNOSIS — E785 Hyperlipidemia, unspecified: Secondary | ICD-10-CM

## 2020-04-14 DIAGNOSIS — E11319 Type 2 diabetes mellitus with unspecified diabetic retinopathy without macular edema: Secondary | ICD-10-CM

## 2020-04-14 DIAGNOSIS — Z794 Long term (current) use of insulin: Secondary | ICD-10-CM | POA: Diagnosis not present

## 2020-04-14 LAB — POCT GLYCOSYLATED HEMOGLOBIN (HGB A1C): Hemoglobin A1C: 6.9 % — AB (ref 4.0–5.6)

## 2020-04-14 NOTE — Addendum Note (Signed)
Addended by: Darliss Ridgel I on: 04/14/2020 02:42 PM   Modules accepted: Orders

## 2020-04-14 NOTE — Patient Instructions (Addendum)
Please continue: - Metformin 850 mg in am and 1700 mg at night - Ozempic 1 mg weekly - Basaglar 70 units at bedtime - NovoLog 3x a day before meals 15 units before a smaller meal 20 units before a larger meal (may need 24-25 units before a large meal)  Please return in 3-4 months with your sugar log.

## 2020-04-14 NOTE — Progress Notes (Signed)
Patient ID: Andrea Marks, female   DOB: 11-Jan-1951, 69 y.o.   MRN: [id number]  This visit occurred during the SARS-CoV-2 public health emergency.  Safety protocols were in place, including screening questions prior to the visit, additional usage of staff PPE, and extensive cleaning of exam room while observing appropriate contact time as indicated for disinfecting solutions.   HPI: Andrea Marks is a 69 y.o.-year-old female, returning for f/u for DM2, dx 1994, insulin-dependent, uncontrolled, with complications (diabetic retinopathy). Last visit was 3 months ago.  Reviewed HbA1c levels: Lab Results  Component Value Date   HGBA1C 7.3 (A) 01/04/2020   HGBA1C 10.5 (H) 09/10/2019   HGBA1C 10.4 (H) 12/15/2018   Previously on: - Metformin 750 >> 850 mg in am and 1700 mg at night - Trulicity 1.5 mg weekly - Lantus 55 units at bedtime  - Humalog 3x a day before meals - misses doses 15 units before a smaller meal 20 units before a larger meal Tried Bydureon once a week >> she had problems injecting the medication.  She also tried Victoza. She was on Humalog in the past. She was on Amaryl in the past, which we stopped going to increase her insulin doses 05/2018. She was on a Vigo pump, which we stopped in 2019 as she needed higher doses of insulin.  Currently on: - Metformin 850 mg in am and 1700 mg at night - Ozempic 1 mg weekly - Basaglar 60 units at bedtime >> Basaglar 70 units at bedtime - NovoLog 3x a day before meals 15 units before a smaller meal 20 units before a larger meal  She checks her sugars 4 times a day with her freestyle libre 2 CGM  Freestyle libre CGM parameters: - Average: 131 >> 146 - % active CGM time: 17% of the time - Glucose variability 30.2%>> 27.3% (target < or = to 36%) - time in range:  - very low (<54): 2% >> 0% - low (54-69): 9% >> 1% - normal range (70-180): 79% >> 82% - high sugars (181-250): 10% >> 15% - very high sugars (>250): 0% >>  2%     Meals: - Breakfast: egg + bagel + yoghurt - Lunch: may skip, salad - Dinner: salad, hamburger/hot dog, tuna fish - Snacks: potato chips, pretzel No sodas, only drinks sparkling water.  No CKD, last BUN/creatinine was:  Lab Results  Component Value Date   BUN 22 03/11/2020   CREATININE 0.98 03/11/2020  On benazepril. + HL; Last set of lipids: Lab Results  Component Value Date   CHOL 113 03/11/2020   HDL 49.50 03/11/2020   LDLCALC 39 03/11/2020   LDLDIRECT 64.0 04/21/2017   TRIG 123.0 03/11/2020   CHOLHDL 2 03/11/2020  On Vytorin and fenofibrate. - Last eye exam: 03/2020: No DR reportedly. She will have cataract sx soon. - no numbness and tingling in her feet. She sees a Art therapist - Dr. Prudence Davidson.  She had an ulcer in lower right leg (has lymphedema) >> healed.  She also has a history of HTN, GERD, depression, h/o thrombophlebitis.  Previously on B12 injections, currently on 1000 mcg p.o. B12 daily.  Her husband has lung cancer.  ROS: Constitutional: no weight gain/no weight loss, + fatigue, no subjective hyperthermia, no subjective hypothermia Eyes: + blurry vision, no xerophthalmia ENT: no sore throat, no nodules palpated in neck, no dysphagia, no odynophagia, no hoarseness Cardiovascular: no CP/no SOB/no palpitations/no leg swelling Respiratory: no cough/no SOB/no wheezing Gastrointestinal: no N/no V/no D/no C/no acid reflux Musculoskeletal:  no muscle aches/no joint aches Skin: no rashes, no hair loss Neurological: no tremors/no numbness/no tingling/no dizziness  I reviewed pt's medications, allergies, PMH, social hx, family hx, and changes were documented in the history of present illness. Otherwise, unchanged from my initial visit note.  Past Medical History:  Diagnosis Date  . Depression   . Diabetes mellitus   . GERD (gastroesophageal reflux disease)   . Hyperlipidemia   . Hypertension   . Obesity    Past Surgical History:  Procedure Laterality  Date  . ABDOMINAL HYSTERECTOMY  1991   BSO   Social History   Socioeconomic History  . Marital status: Married    Spouse name: Not on file  . Number of children: 0  . Years of education: 51  . Highest education level: Not on file  Occupational History  . Occupation: retired from [employer]: RETIRED  Tobacco Use  . Smoking status: Former Smoker    Quit date: 1995    Years since quitting: 26.4  . Smokeless tobacco: Never Used  Substance and Sexual Activity  . Alcohol use: No  . Drug use: No  . Sexual activity: Yes    Partners: Male  Other Topics Concern  . Not on file  Social History Narrative   No reg exercise   Social Determinants of Health   Financial Resource Strain:   . Difficulty of Paying Living Expenses:   Food Insecurity:   . Worried About Charity fundraiser in the Last Year:   . Arboriculturist in the Last Year:   Transportation Needs:   . Film/video editor (Medical):   Marland Kitchen Lack of Transportation (Non-Medical):   Physical Activity:   . Days of Exercise per Week:   . Minutes of Exercise per Session:   Stress:   . Feeling of Stress :   Social Connections:   . Frequency of Communication with Friends and Family:   . Frequency of Social Gatherings with Friends and Family:   . Attends Religious Services:   . Active Member of Clubs or Organizations:   . Attends Archivist Meetings:   Marland Kitchen Marital Status:   Intimate Partner Violence:   . Fear of Current or Ex-Partner:   . Emotionally Abused:   Marland Kitchen Physically Abused:   . Sexually Abused:    Current Outpatient Medications on File Prior to Visit  Medication Sig Dispense Refill  . acetaminophen (TYLENOL ARTHRITIS PAIN) 650 MG CR tablet Take 650 mg by mouth 2 (two) times daily as needed. For pain    . aspirin 81 MG tablet Take 81 mg by mouth daily.      . benazepril (LOTENSIN) 20 MG tablet Take 1 tablet (20 mg total) by mouth daily. 90 tablet 3  . Blood Glucose Monitoring Suppl (ONE  TOUCH ULTRA SYSTEM KIT) W/DEVICE KIT 1 kit by Does not apply route once. 1 each 0  . Continuous Blood Gluc Receiver (FREESTYLE LIBRE 14 DAY READER) DEVI 1 Device by Does not apply route See admin instructions. Use for continuous monitor glucose 1 each 0  . Continuous Blood Gluc Sensor (FREESTYLE LIBRE 14 DAY SENSOR) MISC Apply new sensor every 14 days for continuous glucose monitoring. 6 each 2  . estradiol (ESTRACE) 1 MG tablet TAKE 1 TABLET BY MOUTH EVERY DAY 90 tablet 5  . ezetimibe-simvastatin (VYTORIN) 10-40 MG tablet Take 1 tablet by mouth at bedtime. 90 tablet 1  . famotidine (PEPCID) 20 MG tablet Take  20 mg by mouth 2 (two) times daily.    . fenofibrate 160 MG tablet 1 po qd 90 tablet 3  . FLUoxetine (PROZAC) 20 MG tablet Take 60 mg by mouth daily.    Marland Kitchen glucose blood (ONETOUCH ULTRA) test strip Use to check blood sugar 3 times a day 100 each 0  . insulin aspart (NOVOLOG FLEXPEN) 100 UNIT/ML FlexPen Inject 20-30 Units into the skin 3 (three) times daily with meals. 81 mL 3  . Insulin Glargine (BASAGLAR KWIKPEN) 100 UNIT/ML Inject 0.6 mLs (60 Units total) into the skin at bedtime. 25 pen 3  . Insulin Pen Needle (NOVOFINE) 32G X 6 MM MISC To use w/ Lantus 100 each 3  . metFORMIN (GLUCOPHAGE) 850 MG tablet TAKE 1 TABLET EVERY MORNING AND TAKE 2 TABLETS EVERY EVENING 270 tablet 1  . pantoprazole (PROTONIX) 40 MG tablet TAKE 1 TABLET BY MOUTH EVERY DAY 30 tablet 3  . Semaglutide, 1 MG/DOSE, (OZEMPIC, 1 MG/DOSE,) 2 MG/1.5ML SOPN Inject 1 mg into the skin once a week. 6 pen 2   Current Facility-Administered Medications on File Prior to Visit  Medication Dose Route Frequency Provider Last Rate Last Admin  . ipratropium-albuterol (DUONEB) 0.5-2.5 (3) MG/3ML nebulizer solution 3 mL  3 mL Nebulization Q6H Copland, Jessica C, MD   3 mL at 11/15/16 1330   Allergies  Allergen Reactions  . Hydrocodone-Acetaminophen Other (See Comments)    unknown  . Oxycodone Hcl Other (See Comments)    unknown  .  Penicillins Other (See Comments)    unknown  . Prednisone    Family History  Problem Relation Age of Onset  . Macular degeneration Mother   . Heart disease Mother        syncope  . Aortic stenosis Mother   . Lung disease Father 28       mesothelioma  . Cancer Maternal Aunt        stomach  . Heart disease Paternal Uncle        cabg  . Diabetes Paternal Uncle   . Heart disease Paternal Uncle   . Diabetes Paternal Uncle   . Diabetes Paternal Uncle   . Heart disease Paternal Uncle   . Heart disease Paternal Uncle   . Heart disease Paternal Uncle   . Lung cancer Unknown        asbestos  . Diabetes Paternal Grandmother   . Cancer Other        lung   PE: BP (!) 160/80   Pulse (!) 105   Ht '5\' 3"'$  (1.6 m)   Wt 266 lb (120.7 kg)   SpO2 96%   BMI 47.12 kg/m  Wt Readings from Last 3 Encounters:  04/14/20 266 lb (120.7 kg)  03/28/20 267 lb (121.1 kg)  03/11/20 263 lb (119.3 kg)   Constitutional: overweight, in NAD Eyes: PERRLA, EOMI, no exophthalmos ENT: moist mucous membranes, no thyromegaly, no cervical lymphadenopathy Cardiovascular: RRR, No MRG, + B lymphedema Respiratory: CTA B Gastrointestinal: abdomen soft, NT, ND, BS+ Musculoskeletal: no deformities, strength intact in all 4 Skin: moist, warm, + venous stasis rash bilaterally Neurological: no tremor with outstretched hands, DTR normal in all 4  ASSESSMENT: 1. DM2, insulin-dependent, uncontrolled, with complications - DR  2. Obesity class 3  3. HL  PLAN:  1. Patient with uncontrolled type 2 diabetes, insulin-dependent, worse than last year, when she returns after an absence of 1 year and 5 months.  At that time, she had a lot of stress  in her life and also relaxed her diet.  Sugars were in the 200s to 400s.  At that time, we discussed about improving her diet by limiting fast foods but we also increased her insulin doses and also her GLP-1 receptor agonist dose.  Trulicity was not covered so we switched to  Ozempic high-dose.  We also switched from Lantus to [employer] per insurance preference.  After this, her sugar started to improve and at last visit, she even had lows in the second half of the day.  At that time we decreased her Basaglar and NovoLog doses but I also advised her to increase the GLP-1 receptor agonist dose. -At this visit, sugars are even better per review of her CGM downloads (will scan them), with the time in range 82% and less lows compared to before.  She occasionally has higher blood sugars after breakfast and we discussed about possibly using a slightly higher dose of NovoLog before this meal.  Otherwise, we will not change her regimen.  - I advised her to:  Patient Instructions  Please continue: - Metformin 850 mg in am and 1700 mg at night - Ozempic 1 mg weekly - Basaglar 70 units at bedtime - NovoLog 3x a day before meals 15 units before a smaller meal 20 units before a larger meal (may need 24-25 units before a large meal)  Please return in 3-4 months with your sugar log.  - we checked her HbA1c: 6.9% (lower) - advised to check sugars at different times of the day - 4x a day, rotating check times - advised for yearly eye exams >> she is UTD - return to clinic in 3 months  2. Obesity class 3  -Continue GLP-1 receptor agonist which should also help with weight loss -She lost 4 pounds since last visit  3. HL -Reviewed her lipid panel from 03/2020: Fractions at goal: Lab Results  Component Value Date   CHOL 113 03/11/2020   HDL 49.50 03/11/2020   LDLCALC 39 03/11/2020   LDLDIRECT 64.0 04/21/2017   TRIG 123.0 03/11/2020   CHOLHDL 2 03/11/2020  -Continues Vytorin and fenofibrate without side effects  Philemon Kingdom, MD PhD [facility] Endocrinology

## 2020-04-17 ENCOUNTER — Encounter: Payer: Self-pay | Admitting: Family Medicine

## 2020-04-17 DIAGNOSIS — Z780 Asymptomatic menopausal state: Secondary | ICD-10-CM

## 2020-04-17 DIAGNOSIS — K219 Gastro-esophageal reflux disease without esophagitis: Secondary | ICD-10-CM

## 2020-04-17 MED ORDER — PANTOPRAZOLE SODIUM 40 MG PO TBEC: 40.0000 mg | DELAYED_RELEASE_TABLET | Freq: Every day | ORAL | 3 refills | Status: DC

## 2020-04-17 MED ORDER — FLUOXETINE HCL 20 MG PO TABS: 60.0000 mg | ORAL_TABLET | Freq: Every day | ORAL | 1 refills | Status: DC

## 2020-04-17 MED ORDER — ESTRADIOL 1 MG PO TABS: 1.0000 mg | ORAL_TABLET | Freq: Every day | ORAL | 1 refills | Status: DC

## 2020-05-13 ENCOUNTER — Encounter: Payer: Self-pay | Admitting: Family Medicine

## 2020-05-15 ENCOUNTER — Encounter: Payer: Self-pay | Admitting: *Deleted

## 2020-05-15 NOTE — Progress Notes (Addendum)
Subjective:   Andrea Marks is a 69 y.o. female who presents for Medicare Annual (Subsequent) preventive examination.  Review of Systems    Cardiac Risk Factors include: advanced age (>54mn, >>59women);diabetes mellitus;dyslipidemia;hypertension;obesity (BMI >30kg/m2);sedentary lifestyle     Objective:    Today's Vitals   05/16/20 1128  BP: 128/66  Pulse: 86  Temp: (!) 97 F (36.1 C)  TempSrc: Temporal  SpO2: 98%  Weight: 266 lb 8 oz (120.9 kg)  Height: '5\' 3"'$  (1.6 m)   Body mass index is 47.21 kg/m.  Advanced Directives 05/16/2020 06/29/2018 01/24/2017 11/15/2016  Does Patient Have a Medical Advance Directive? No No No No  Would patient like information on creating a medical advance directive? Yes (MAU/Ambulatory/Procedural Areas - Information given) No - Patient declined Yes (MAU/Ambulatory/Procedural Areas - Information given) -    Current Medications (verified) Outpatient Encounter Medications as of 05/16/2020  Medication Sig  . acetaminophen (TYLENOL ARTHRITIS PAIN) 650 MG CR tablet Take 650 mg by mouth 2 (two) times daily as needed. For pain  . aspirin 81 MG tablet Take 81 mg by mouth daily.    . benazepril (LOTENSIN) 20 MG tablet Take 1 tablet (20 mg total) by mouth daily.  . Blood Glucose Monitoring Suppl (ONE TOUCH ULTRA SYSTEM KIT) W/DEVICE KIT 1 kit by Does not apply route once.  . Continuous Blood Gluc Receiver (FREESTYLE LIBRE 14 DAY READER) DEVI 1 Device by Does not apply route See admin instructions. Use for continuous monitor glucose  . Continuous Blood Gluc Sensor (FREESTYLE LIBRE 14 DAY SENSOR) MISC Apply new sensor every 14 days for continuous glucose monitoring.  .Marland Kitchenestradiol (ESTRACE) 1 MG tablet Take 1 tablet (1 mg total) by mouth daily.  .Marland Kitchenezetimibe-simvastatin (VYTORIN) 10-40 MG tablet Take 1 tablet by mouth at bedtime.  . fenofibrate 160 MG tablet 1 po qd  . FLUoxetine (PROZAC) 20 MG tablet Take 3 tablets (60 mg total) by mouth daily.  .Marland Kitchenglucose  blood (ONETOUCH ULTRA) test strip Use to check blood sugar 3 times a day  . insulin aspart (NOVOLOG FLEXPEN) 100 UNIT/ML FlexPen Inject 20-30 Units into the skin 3 (three) times daily with meals.  . Insulin Glargine (BASAGLAR KWIKPEN) 100 UNIT/ML Inject 0.6 mLs (60 Units total) into the skin at bedtime. (Patient taking differently: Inject 70 Units into the skin at bedtime. )  . Insulin Pen Needle (NOVOFINE) 32G X 6 MM MISC To use w/ Lantus  . metFORMIN (GLUCOPHAGE) 850 MG tablet TAKE 1 TABLET EVERY MORNING AND TAKE 2 TABLETS EVERY EVENING  . pantoprazole (PROTONIX) 40 MG tablet Take 1 tablet (40 mg total) by mouth daily.  . Semaglutide, 1 MG/DOSE, (OZEMPIC, 1 MG/DOSE,) 2 MG/1.5ML SOPN Inject 1 mg into the skin once a week.  . [DISCONTINUED] famotidine (PEPCID) 20 MG tablet Take 20 mg by mouth 2 (two) times daily.   Facility-Administered Encounter Medications as of 05/16/2020  Medication  . ipratropium-albuterol (DUONEB) 0.5-2.5 (3) MG/3ML nebulizer solution 3 mL    Allergies (verified) Hydrocodone-acetaminophen, Oxycodone hcl, Penicillins, and Prednisone   History: Past Medical History:  Diagnosis Date  . Depression   . Diabetes mellitus   . GERD (gastroesophageal reflux disease)   . Hyperlipidemia   . Hypertension   . Obesity    Past Surgical History:  Procedure Laterality Date  . ABDOMINAL HYSTERECTOMY  1991   BSO   Family History  Problem Relation Age of Onset  . Macular degeneration Mother   . Heart disease Mother  syncope  . Aortic stenosis Mother   . Lung disease Father 62       mesothelioma  . Cancer Maternal Aunt        stomach  . Heart disease Paternal Uncle        cabg  . Diabetes Paternal Uncle   . Heart disease Paternal Uncle   . Diabetes Paternal Uncle   . Diabetes Paternal Uncle   . Heart disease Paternal Uncle   . Heart disease Paternal Uncle   . Heart disease Paternal Uncle   . Lung cancer Other        asbestos  . Diabetes Paternal Grandmother    . Cancer Other        lung   Social History   Socioeconomic History  . Marital status: Married    Spouse name: Not on file  . Number of children: 0  . Years of education: 11  . Highest education level: Not on file  Occupational History  . Occupation: retired from Surveyor, minerals: RETIRED  Tobacco Use  . Smoking status: Former Smoker    Quit date: 1995    Years since quitting: 26.5  . Smokeless tobacco: Never Used  Substance and Sexual Activity  . Alcohol use: No  . Drug use: No  . Sexual activity: Yes    Partners: Male  Other Topics Concern  . Not on file  Social History Narrative   No reg exercise   Social Determinants of Health   Financial Resource Strain:   . Difficulty of Paying Living Expenses:   Food Insecurity:   . Worried About Charity fundraiser in the Last Year:   . Arboriculturist in the Last Year:   Transportation Needs:   . Film/video editor (Medical):   Marland Kitchen Lack of Transportation (Non-Medical):   Physical Activity:   . Days of Exercise per Week:   . Minutes of Exercise per Session:   Stress:   . Feeling of Stress :   Social Connections:   . Frequency of Communication with Friends and Family:   . Frequency of Social Gatherings with Friends and Family:   . Attends Religious Services:   . Active Member of Clubs or Organizations:   . Attends Archivist Meetings:   Marland Kitchen Marital Status:     Tobacco Counseling Counseling given: Not Answered   Clinical Intake:     Pain : No/denies pain     Activities of Daily Living In your present state of health, do you have any difficulty performing the following activities: 05/16/2020  Hearing? N  Vision? N  Difficulty concentrating or making decisions? N  Walking or climbing stairs? N  Dressing or bathing? N  Doing errands, shopping? N  Preparing Food and eating ? N  Using the Toilet? N  In the past six months, have you accidently leaked urine? N  Do you have problems with  loss of bowel control? N  Managing your Medications? N  Managing your Finances? N  Housekeeping or managing your Housekeeping? N  Some recent data might be hidden    Patient Care Team: Carollee Herter, Alferd Apa, DO as PCP - General Gardiner Barefoot, DPM as Consulting Physician (Podiatry)  Indicate any recent Medical Services you may have received from other than [facility] providers in the past year (date may be approximate).     Assessment:   This is a routine wellness examination for Malaika.   Dietary issues and  exercise activities discussed: Current Exercise Habits: The patient does not participate in regular exercise at present, Exercise limited by: None identified Diet (meal preparation, eat out, water intake, caffeinated beverages, dairy products, fruits and vegetables): 24 hr recall Breakfast: egg and bacon bagel Lunch: egg salad, pasta Dinner:  Chicken salad and pretzels  Goals    . Increase physical activity      Depression Screen PHQ 2/9 Scores 05/16/2020 12/19/2018 01/24/2017 06/11/2016  PHQ - 2 Score 1 2 0 5  PHQ- 9 Score - 5 - 17    Fall Risk Fall Risk  05/16/2020 05/23/2018 01/24/2017 12/07/2016 06/11/2016  Falls in the past year? 0 Yes Yes Yes No  Number falls in past yr: 0 '1 1 1 '$ -  Comment - - - 02/012/2018 -  Injury with Fall? 0 Yes Yes Yes -  Follow up Education provided;Falls prevention discussed - - - -   LIVES W/ HUSBAND IN 2 STORY HOME. Any stairs in or around the home? Yes  If so, are there any without handrails? No  Home free of loose throw rugs in walkways, pet beds, electrical cords, etc? Yes  Adequate lighting in your home to reduce risk of falls? Yes   ASSISTIVE DEVICES UTILIZED TO PREVENT FALLS:  Life alert? No  Use of a cane, walker or w/c? Yes  Grab bars in the bathroom? No  Shower chair or bench in shower? No  Elevated toilet seat or a handicapped toilet? No     Gait steady and fast without use of assistive device  Cognitive Function: Ad8 score  reviewed for issues:  Issues making decisions:NO  Less interest in hobbies / activities:NO  Repeats questions, stories (family complaining):NO  Trouble using ordinary gadgets (microwave, computer, phone):NO  Forgets the month or year: NO  Mismanaging finances: NO  Remembering appts:NO  Daily problems with thinking and/or memory:NO Ad8 score is=0         Immunizations Immunization History  Administered Date(s) Administered  . PFIZER SARS-COV-2 Vaccination 01/06/2020, 02/06/2020  . Pneumococcal Conjugate-13 08/27/2016  . Pneumococcal Polysaccharide-23 05/08/2003, 11/10/2009, 12/15/2018  . Td 05/08/2003  . Tdap 06/08/2011  . Zoster 02/10/2012  . Zoster Recombinat (Shingrix) 03/12/2020, 05/16/2020    TDAP status: Up to date Pneumococcal vaccine status: Up to date Covid-19 vaccine status: Completed vaccines  Qualifies for Shingles Vaccine? Yes   Zostavax completed Yes     Screening Tests Health Maintenance  Topic Date Due  . MAMMOGRAM  12/20/2023 (Originally 02/24/2012)  . DEXA SCAN  12/20/2023 (Originally 02/19/2016)  . Hepatitis C Screening  12/20/2023 (Originally [birth year])  . INFLUENZA VACCINE  08/21/2024 (Originally 06/01/2020)  . HEMOGLOBIN A1C  10/14/2020  . FOOT EXAM  11/27/2020  . COLONOSCOPY  02/16/2021  . OPHTHALMOLOGY EXAM  04/14/2021  . TETANUS/TDAP  06/07/2021  . COVID-19 Vaccine  Completed  . PNA vac Low Risk Adult  Completed    Health Maintenance  There are no preventive care reminders to display for this patient.  Pt declines mammogram, bone density, and colonoscopy.Education provided. Will defer to PCP for further discussion.  Lung Cancer Screening: (Low Dose CT Chest recommended if Age 56-80 years, 30 pack-year currently smoking OR have quit w/in 15years.) does not qualify.   Additional Screening: Vision Screening: Recommended annual ophthalmology exams for early detection of glaucoma and other disorders of the eye. Is the patient up to  date with their annual eye exam?  Yes  Who is the provider or what is the name of the office  in which the patient attends annual eye exams? [place]   Dental Screening: Recommended annual dental exams for proper oral hygiene  Community Resource Referral / Chronic Care Management: CRR required this visit?  No   CCM required this visit?  No      Plan:    Please schedule your next medicare wellness visit with me in 1 yr.  Continue to eat heart healthy diet (full of fruits, vegetables, whole grains, lean protein, water--limit salt, fat, and sugar intake) and increase physical activity as tolerated.  Continue doing brain stimulating activities (puzzles, reading, adult coloring books, staying active) to keep memory sharp.   Bring a copy of your living will and/or healthcare power of attorney to your next office visit.    I have personally reviewed and noted the following in the patient's chart:   . Medical and social history . Use of alcohol, tobacco or illicit drugs  . Current medications and supplements . Functional ability and status . Nutritional status . Physical activity . Advanced directives . List of other physicians . Hospitalizations, surgeries, and ER visits in previous 12 months . Vitals . Screenings to include cognitive, depression, and falls . Referrals and appointments  In addition, I have reviewed and discussed with patient certain preventive protocols, quality metrics, and best practice recommendations. A written personalized care plan for preventive services as well as general preventive health recommendations were provided to patient.    Pt states she would like to view avs on my-chart.  Shela Nevin, RN   05/16/2020    Reviewed  [name], DO

## 2020-05-16 ENCOUNTER — Ambulatory Visit (INDEPENDENT_AMBULATORY_CARE_PROVIDER_SITE_OTHER): Payer: Medicare Other | Admitting: *Deleted

## 2020-05-16 ENCOUNTER — Other Ambulatory Visit: Payer: Self-pay

## 2020-05-16 ENCOUNTER — Encounter: Payer: Self-pay | Admitting: *Deleted

## 2020-05-16 VITALS — BP 128/66 | HR 86 | Temp 97.0°F | Ht 63.0 in | Wt 266.5 lb

## 2020-05-16 DIAGNOSIS — Z0000 Encounter for general adult medical examination without abnormal findings: Secondary | ICD-10-CM | POA: Diagnosis not present

## 2020-05-16 MED FILL — SHINGRIX 50 MCG SUS: 50 | 1 days supply | Qty: 1 | Fill #1

## 2020-05-16 NOTE — Patient Instructions (Signed)
Please schedule your next medicare wellness visit with me in 1 yr.  Continue to eat heart healthy diet (full of fruits, vegetables, whole grains, lean protein, water--limit salt, fat, and sugar intake) and increase physical activity as tolerated.  Continue doing brain stimulating activities (puzzles, reading, adult coloring books, staying active) to keep memory sharp.   Bring a copy of your living will and/or healthcare power of attorney to your next office visit.   Andrea Marks , Thank you for taking time to come for your Medicare Wellness Visit. I appreciate your ongoing commitment to your health goals. Please review the following plan we discussed and let me know if I can assist you in the future.   These are the goals we discussed: Goals    . Increase physical activity       This is a list of the screening recommended for you and due dates:  Health Maintenance  Topic Date Due  . Mammogram  12/20/2023*  . DEXA scan (bone density measurement)  12/20/2023*  .  Hepatitis C: One time screening is recommended by Center for Disease Control  (CDC) for  adults born from [birth year] through 1965.   12/20/2023*  . Flu Shot  08/21/2024*  . Hemoglobin A1C  10/14/2020  . Complete foot exam   11/27/2020  . Colon Cancer Screening  02/16/2021  . Eye exam for diabetics  04/14/2021  . Tetanus Vaccine  06/07/2021  . COVID-19 Vaccine  Completed  . Pneumonia vaccines  Completed  *Topic was postponed. The date shown is not the original due date.    Preventive Care 69 Years and Older, Female Preventive care refers to lifestyle choices and visits with your health care provider that can promote health and wellness. This includes:  A yearly physical exam. This is also called an annual well check.  Regular dental and eye exams.  Immunizations.  Screening for certain conditions.  Healthy lifestyle choices, such as diet and exercise. What can I expect for my preventive care visit? Physical exam Your  health care provider will check:  Height and weight. These may be used to calculate body mass index (BMI), which is a measurement that tells if you are at a healthy weight.  Heart rate and blood pressure.  Your skin for abnormal spots. Counseling Your health care provider may ask you questions about:  Alcohol, tobacco, and drug use.  Emotional well-being.  Home and relationship well-being.  Sexual activity.  Eating habits.  History of falls.  Memory and ability to understand (cognition).  Work and work Statistician.  Pregnancy and menstrual history. What immunizations do I need?  Influenza (flu) vaccine  This is recommended every year. Tetanus, diphtheria, and pertussis (Tdap) vaccine  You may need a Td booster every 10 years. Varicella (chickenpox) vaccine  You may need this vaccine if you have not already been vaccinated. Zoster (shingles) vaccine  You may need this after age 3. Pneumococcal conjugate (PCV13) vaccine  One dose is recommended after age 69. Pneumococcal polysaccharide (PPSV23) vaccine  One dose is recommended after age 1. Measles, mumps, and rubella (MMR) vaccine  You may need at least one dose of MMR if you were born in 1957 or later. You may also need a second dose. Meningococcal conjugate (MenACWY) vaccine  You may need this if you have certain conditions. Hepatitis A vaccine  You may need this if you have certain conditions or if you travel or work in places where you may be exposed to hepatitis A.  Hepatitis B vaccine  You may need this if you have certain conditions or if you travel or work in places where you may be exposed to hepatitis B. Haemophilus influenzae type b (Hib) vaccine  You may need this if you have certain conditions. You may receive vaccines as individual doses or as more than one vaccine together in one shot (combination vaccines). Talk with your health care provider about the risks and benefits of combination  vaccines. What tests do I need? Blood tests  Lipid and cholesterol levels. These may be checked every 5 years, or more frequently depending on your overall health.  Hepatitis C test.  Hepatitis B test. Screening  Lung cancer screening. You may have this screening every year starting at age 69 if you have a 30-pack-year history of smoking and currently smoke or have quit within the past 15 years.  Colorectal cancer screening. All adults should have this screening starting at age 69 and continuing until age 13. Your health care provider may recommend screening at age 75 if you are at increased risk. You will have tests every 1-10 years, depending on your results and the type of screening test.  Diabetes screening. This is done by checking your blood sugar (glucose) after you have not eaten for a while (fasting). You may have this done every 1-3 years.  Mammogram. This may be done every 1-2 years. Talk with your health care provider about how often you should have regular mammograms.  BRCA-related cancer screening. This may be done if you have a family history of breast, ovarian, tubal, or peritoneal cancers. Other tests  Sexually transmitted disease (STD) testing.  Bone density scan. This is done to screen for osteoporosis. You may have this done starting at age 39. Follow these instructions at home: Eating and drinking  Eat a diet that includes fresh fruits and vegetables, whole grains, lean protein, and low-fat dairy products. Limit your intake of foods with high amounts of sugar, saturated fats, and salt.  Take vitamin and mineral supplements as recommended by your health care provider.  Do not drink alcohol if your health care provider tells you not to drink.  If you drink alcohol: ? Limit how much you have to 0-1 drink a day. ? Be aware of how much alcohol is in your drink. In the [place] one drink equals one 12 oz bottle of beer (355 mL), one 5 oz glass of wine (148 mL), or one  1 oz glass of hard liquor (44 mL). Lifestyle  Take daily care of your teeth and gums.  Stay active. Exercise for at least 30 minutes on 5 or more days each week.  Do not use any products that contain nicotine or tobacco, such as cigarettes, e-cigarettes, and chewing tobacco. If you need help quitting, ask your health care provider.  If you are sexually active, practice safe sex. Use a condom or other form of protection in order to prevent STIs (sexually transmitted infections).  Talk with your health care provider about taking a low-dose aspirin or statin. What's next?  Go to your health care provider once a year for a well check visit.  Ask your health care provider how often you should have your eyes and teeth checked.  Stay up to date on all vaccines. This information is not intended to replace advice given to you by your health care provider. Make sure you discuss any questions you have with your health care provider. Document Revised: 10/12/2018 Document Reviewed: 10/12/2018 Elsevier  Patient Education  [employer].

## 2020-06-25 ENCOUNTER — Encounter: Payer: Self-pay | Admitting: Podiatry

## 2020-06-25 ENCOUNTER — Other Ambulatory Visit: Payer: Self-pay

## 2020-06-25 ENCOUNTER — Ambulatory Visit (INDEPENDENT_AMBULATORY_CARE_PROVIDER_SITE_OTHER): Payer: Medicare Other | Admitting: Podiatry

## 2020-06-25 DIAGNOSIS — L84 Corns and callosities: Secondary | ICD-10-CM

## 2020-06-25 DIAGNOSIS — M79676 Pain in unspecified toe(s): Secondary | ICD-10-CM

## 2020-06-25 DIAGNOSIS — E1151 Type 2 diabetes mellitus with diabetic peripheral angiopathy without gangrene: Secondary | ICD-10-CM | POA: Diagnosis not present

## 2020-06-25 DIAGNOSIS — Z794 Long term (current) use of insulin: Secondary | ICD-10-CM

## 2020-06-25 DIAGNOSIS — B351 Tinea unguium: Secondary | ICD-10-CM

## 2020-06-25 NOTE — Progress Notes (Signed)
This patient returns to my office for at risk foot care.  This patient requires this care by a professional since this patient will be at risk due to having diabetes and history of thrombophlebitis.  This patient is unable to cut nails herself since the patient cannot reach her nails.These nails are painful walking and wearing shoes.  This patient presents for at risk foot care today.  General Appearance  Alert, conversant and in no acute stress.  Vascular  Dorsalis pedis and posterior tibial  pulses are not  palpable  Bilaterally  Due to swelling..  Capillary return is within normal limits  bilaterally. Temperature is within normal limits  bilaterally.  Neurologic  Senn-Weinstein monofilament wire test diminished  bilaterally. Muscle power within normal limits bilaterally.  Nails Thick disfigured discolored nails with subungual debris  from hallux to fifth toes bilaterally. No evidence of bacterial infection or drainage bilaterally.  Orthopedic  No limitations of motion  feet .  No crepitus or effusions noted.  No bony pathology or digital deformities noted.  Skin  normotropic skin with no porokeratosis noted bilaterally.  No signs of infections or ulcers noted.   Pinch callus  B/L. Blood clot noted under third toenail left foot from her cutting her nails herself.  No infection or drainage noted.  Onychomycosis  Pain in right toes  Pain in left toes  Skin trauma 3rd toenail left foot.  Consent was obtained for treatment procedures.   Mechanical debridement of nails 1-5  bilaterally performed with a nail nipper.  Filed with dremel without incident.    Return office visit   9 weeks                  Told patient to return for periodic foot care and evaluation due to potential at risk complications. Neosporin/DSD third toe left foot.   Helane Gunther DPM

## 2020-07-09 ENCOUNTER — Telehealth: Payer: Self-pay | Admitting: Family Medicine

## 2020-07-09 NOTE — Telephone Encounter (Signed)
Pt called. Advised that pcp has not been in the office this week and we will get forms signed tomorrow.

## 2020-07-09 NOTE — Telephone Encounter (Signed)
Call back # [phone]  Patient states [facility] faxed over some paperwork in regards to her surgery schedule on 07/16/20. They still waiting for information.

## 2020-07-21 ENCOUNTER — Encounter: Payer: Self-pay | Admitting: Family Medicine

## 2020-07-25 ENCOUNTER — Other Ambulatory Visit: Payer: Self-pay | Admitting: Internal Medicine

## 2020-08-14 ENCOUNTER — Other Ambulatory Visit: Payer: Self-pay

## 2020-08-14 ENCOUNTER — Encounter: Payer: Self-pay | Admitting: Internal Medicine

## 2020-08-14 ENCOUNTER — Ambulatory Visit (INDEPENDENT_AMBULATORY_CARE_PROVIDER_SITE_OTHER): Payer: Medicare Other | Admitting: Internal Medicine

## 2020-08-14 VITALS — BP 130/74 | HR 84 | Ht 63.0 in | Wt 273.0 lb

## 2020-08-14 DIAGNOSIS — E11319 Type 2 diabetes mellitus with unspecified diabetic retinopathy without macular edema: Secondary | ICD-10-CM

## 2020-08-14 DIAGNOSIS — Z794 Long term (current) use of insulin: Secondary | ICD-10-CM

## 2020-08-14 DIAGNOSIS — E785 Hyperlipidemia, unspecified: Secondary | ICD-10-CM

## 2020-08-14 LAB — POCT GLYCOSYLATED HEMOGLOBIN (HGB A1C): Hemoglobin A1C: 5.9 % — AB (ref 4.0–5.6)

## 2020-08-14 NOTE — Patient Instructions (Signed)
Please continue: - Metformin 850 mg in am and 1700 mg at night - Ozempic 1 mg weekly - Basaglar 70 units at bedtime - NovoLog 3x a day before meals 15 units before a smaller meal 20 units before a larger meal (may need 22-25 units before a large meal)  Please return in 4 months with your sugar log.

## 2020-08-14 NOTE — Addendum Note (Signed)
Addended by: Darene Lamer T on: 08/14/2020 02:39 PM   Modules accepted: Orders

## 2020-08-14 NOTE — Progress Notes (Signed)
Patient ID: Andrea Marks, female   DOB: [birth year], 69 y.o.   MRN: [id number]  This visit occurred during the SARS-CoV-2 public health emergency.  Safety protocols were in place, including screening questions prior to the visit, additional usage of staff PPE, and extensive cleaning of exam room while observing appropriate contact time as indicated for disinfecting solutions.   HPI: Andrea Marks is a 69 y.o.-year-old female, returning for f/u for DM2, dx 1994, insulin-dependent, uncontrolled, with complications (diabetic retinopathy). Last visit was 4 months ago.  Reviewed HbA1c levels Lab Results  Component Value Date   HGBA1C 6.9 (A) 04/14/2020   HGBA1C 7.3 (A) 01/04/2020   HGBA1C 10.5 (H) 09/10/2019   Currently on: - Metformin 850 mg in am and  1700 mg at night - Ozempic 1 mg weekly - Basaglar 60 units at bedtime >> Basaglar 70 units at bedtime - NovoLog 3x a day before meals 15 units before a smaller meal 20 units before a larger meal (22 units before a large meal) Tried Bydureon once a week >> she had problems injecting the medication.  She also tried Victoza. She was on Humalog in the past. She was on Amaryl in the past, which we stopped going to increase her insulin doses 05/2018. She was on a Vigo pump, which we stopped in 2019 as she needed higher doses of insulin.  She checks her sugars more than 4 times a day with her freestyle 2 libre CGM.  Freestyle libre CGM parameters: - Average: 131 >> 146 >> 128 - % active CGM time: 17% >> 695 of the time - Glucose variability 30.2%>> 27.3% >> 32.1% (target < or = to 36%) - time in range:  - very low (<54): 2% >> 0% >> 0% - low (54-69): 9% >> 1% >> 6% - normal range (70-180): 79% >> 82% >> 82% - high sugars (181-250): 10% >> 15% >> 11% - very high sugars (>250): 0% >> 2% >> 1%   Previously:    Meals: - Breakfast: egg + bagel + yoghurt - Lunch: may skip, salad - Dinner: salad, hamburger/hot dog, tuna fish -  Snacks: potato chips, pretzel No sodas, only drinks sparkling water.  No CKD, last BUN/creatinine was:  Lab Results  Component Value Date   BUN 22 03/11/2020   CREATININE 0.98 03/11/2020  On benazepril. + HL; Last set of lipids: Lab Results  Component Value Date   CHOL 113 03/11/2020   HDL 49.50 03/11/2020   LDLCALC 39 03/11/2020   LDLDIRECT 64.0 04/21/2017   TRIG 123.0 03/11/2020   CHOLHDL 2 03/11/2020  On Vytorin and fenofibrate - Last eye exam: 03/2020: No DR reportedly, however, she had DR at her exam in 09/2019.  She had cataract surgery. -  no numbness and tingling in her feet. She sees a Art therapist - Dr. Prudence Davidson.  She had an ulcer in lower right leg (has lymphedema) >> healed.  She also has a history of HTN, GERD, depression, h/o thrombophlebitis.  Previously on B12 injections now on 1000 mcg p.o. B12 daily.  Her husband has lung cancer.  ROS: Constitutional: no weight gain/no weight loss, no fatigue, no subjective hyperthermia, no subjective hypothermia Eyes: no blurry vision, no xerophthalmia ENT: no sore throat, no nodules palpated in neck, no dysphagia, no odynophagia, no hoarseness Cardiovascular: no CP/no SOB/no palpitations/no leg swelling Respiratory: no cough/no SOB/no wheezing Gastrointestinal: no N/no V/no D/no C/no acid reflux Musculoskeletal: no muscle aches/no joint aches Skin: no rashes, no hair loss Neurological: no tremors/no  numbness/no tingling/no dizziness  I reviewed pt's medications, allergies, PMH, social hx, family hx, and changes were documented in the history of present illness. Otherwise, unchanged from my initial visit note.   Past Medical History:  Diagnosis Date  . Depression   . Diabetes mellitus   . GERD (gastroesophageal reflux disease)   . Hyperlipidemia   . Hypertension   . Obesity    Past Surgical History:  Procedure Laterality Date  . ABDOMINAL HYSTERECTOMY  1991   BSO   Social History   Socioeconomic History  .  Marital status: Married    Spouse name: Not on file  . Number of children: 0  . Years of education: 8  . Highest education level: Not on file  Occupational History  . Occupation: retired from [employer], [employer]: RETIRED  Tobacco Use  . Smoking status: Former Smoker    Quit date: 1995    Years since quitting: 26.8  . Smokeless tobacco: Never Used  Substance and Sexual Activity  . Alcohol use: No  . Drug use: No  . Sexual activity: Yes    Partners: Male  Other Topics Concern  . Not on file  Social History Narrative   No reg exercise   Social Determinants of Health   Financial Resource Strain:   . Difficulty of Paying Living Expenses: Not on file  Food Insecurity:   . Worried About Charity fundraiser in the Last Year: Not on file  . Ran Out of Food in the Last Year: Not on file  Transportation Needs:   . Lack of Transportation (Medical): Not on file  . Lack of Transportation (Non-Medical): Not on file  Physical Activity:   . Days of Exercise per Week: Not on file  . Minutes of Exercise per Session: Not on file  Stress:   . Feeling of Stress : Not on file  Social Connections:   . Frequency of Communication with Friends and Family: Not on file  . Frequency of Social Gatherings with Friends and Family: Not on file  . Attends Religious Services: Not on file  . Active Member of Clubs or Organizations: Not on file  . Attends Archivist Meetings: Not on file  . Marital Status: Not on file  Intimate Partner Violence:   . Fear of Current or Ex-Partner: Not on file  . Emotionally Abused: Not on file  . Physically Abused: Not on file  . Sexually Abused: Not on file   Current Outpatient Medications on File Prior to Visit  Medication Sig Dispense Refill  . acetaminophen (TYLENOL ARTHRITIS PAIN) 650 MG CR tablet Take 650 mg by mouth 2 (two) times daily as needed. For pain    . aspirin 81 MG tablet Take 81 mg by mouth daily.      . benazepril (LOTENSIN) 20 MG  tablet Take 1 tablet (20 mg total) by mouth daily. 90 tablet 3  . Blood Glucose Monitoring Suppl (ONE TOUCH ULTRA SYSTEM KIT) W/DEVICE KIT 1 kit by Does not apply route once. 1 each 0  . Continuous Blood Gluc Receiver (FREESTYLE LIBRE 14 DAY READER) DEVI 1 Device by Does not apply route See admin instructions. Use for continuous monitor glucose 1 each 0  . Continuous Blood Gluc Sensor (FREESTYLE LIBRE 14 DAY SENSOR) MISC Apply new sensor every 14 days for continuous glucose monitoring. 6 each 2  . estradiol (ESTRACE) 1 MG tablet Take 1 tablet (1 mg total) by mouth daily. 90 tablet 1  .  ezetimibe-simvastatin (VYTORIN) 10-40 MG tablet Take 1 tablet by mouth at bedtime. 90 tablet 1  . fenofibrate 160 MG tablet 1 po qd 90 tablet 3  . FLUoxetine (PROZAC) 20 MG tablet Take 3 tablets (60 mg total) by mouth daily. 270 tablet 1  . glucose blood (ONETOUCH ULTRA) test strip Use to check blood sugar 3 times a day 100 each 0  . insulin aspart (NOVOLOG FLEXPEN) 100 UNIT/ML FlexPen Inject 20-30 Units into the skin 3 (three) times daily with meals. 81 mL 3  . Insulin Glargine (BASAGLAR KWIKPEN) 100 UNIT/ML Inject 0.6 mLs (60 Units total) into the skin at bedtime. (Patient taking differently: Inject 70 Units into the skin at bedtime. ) 25 pen 3  . Insulin Pen Needle (NOVOFINE) 32G X 6 MM MISC To use w/ Lantus 100 each 3  . metFORMIN (GLUCOPHAGE) 850 MG tablet TAKE 1 TABLET EVERY MORNING AND TAKE 2 TABLETS EVERY EVENING 270 tablet 1  . OZEMPIC, 1 MG/DOSE, 4 MG/3ML SOPN INJECT 1MG SUBCUTANEOUSLY  ONCE A WEEK. 9 mL 0  . pantoprazole (PROTONIX) 40 MG tablet Take 1 tablet (40 mg total) by mouth daily. 90 tablet 3  . Semaglutide, 1 MG/DOSE, (OZEMPIC, 1 MG/DOSE,) 2 MG/1.5ML SOPN Inject 1 mg into the skin once a week. 6 pen 2   Current Facility-Administered Medications on File Prior to Visit  Medication Dose Route Frequency Provider Last Rate Last Admin  . ipratropium-albuterol (DUONEB) 0.5-2.5 (3) MG/3ML nebulizer  solution 3 mL  3 mL Nebulization Q6H Copland, Jessica C, MD   3 mL at 11/15/16 1330   Allergies  Allergen Reactions  . Hydrocodone-Acetaminophen Other (See Comments)    unknown  . Oxycodone Hcl Other (See Comments)    unknown  . Penicillins Other (See Comments)    unknown  . Prednisone    Family History  Problem Relation Age of Onset  . Macular degeneration Mother   . Heart disease Mother        syncope  . Aortic stenosis Mother   . Lung disease Father 73       mesothelioma  . Cancer Maternal Aunt        stomach  . Heart disease Paternal Uncle        cabg  . Diabetes Paternal Uncle   . Heart disease Paternal Uncle   . Diabetes Paternal Uncle   . Diabetes Paternal Uncle   . Heart disease Paternal Uncle   . Heart disease Paternal Uncle   . Heart disease Paternal Uncle   . Lung cancer Other        asbestos  . Diabetes Paternal Grandmother   . Cancer Other        lung   PE: BP 130/74   Pulse 84   Ht _0  (1.6 m)   Wt 273 lb (123.8 kg)   SpO2 96%   BMI 48.36 kg/m  Wt Readings from Last 3 Encounters:  08/14/20 273 lb (123.8 kg)  05/16/20 266 lb 8 oz (120.9 kg)  04/14/20 266 lb (120.7 kg)   Constitutional: overweight, in NAD Eyes: PERRLA, EOMI, no exophthalmos ENT: moist mucous membranes, no thyromegaly, no cervical lymphadenopathy Cardiovascular: RRR, No MRG, + bilateral edema Respiratory: CTA B Gastrointestinal: abdomen soft, NT, ND, BS+ Musculoskeletal: no deformities, strength intact in all 4 Skin: moist, warm, + venous stasis rash bilateral lower legs Neurological: no tremor with outstretched hands, DTR normal in all 4  ASSESSMENT: 1. DM2, insulin-dependent, uncontrolled, with complications - DR  2.  Obesity class 3  3. HL  PLAN:  1. Patient with  uncontrolled type 2 diabetes, insulin-dependent, with worse control last year, when she returned after an absence of 1.5 years.  At that time, she had a lot of stress in her life that she also relaxed her  diet.  Sugars were in the 200s to 400s.  We discussed about improving her diet by limiting fast foods but we also increased her insulin doses and her GLP-1 receptor agonist dose.  Trulicity was not covered so we switched to Ozempic high-dose.  We also switch from Lantus to [employer] per insurance preference.  After this, sugars started to improve and she even developed low in the second half of the day.  At that time, we decreased her insulin doses.  At last visit, sugars improved further per review of her CGM downloads but she was having higher blood sugars after breakfast and we discussed about possibly using a higher dose of insulin before dyspnea.  Otherwise, we did not change her regimen then.  Her HbA1c was better, at 6.9%. CGM interpretation: -At today's visit, we reviewed together her CGM tracings for the last month.  Sugars remain well controlled, with 82% of her values in target range.  She only has 12% of her blood sugars higher than target, which is at goal.  She has mild low blood sugar slightly lower than 70 6% of the times.  We did discuss at this visit about hypoglycemia management and I advised her to put glucose tablets or glucose gel in her car in case she developed hypoglycemia while in her car.  -Her blood sugars appear to be increasing mildly after breakfast, but still in the target range, and with a similar profile after lunch.  However, after dinner, sugars are more variable and occasionally go above 180.  I advised her that she may use a slightly higher doses of NovoLog before larger dinners, especially with the holidays coming up.  Otherwise, I do not feel that we need to change her regimen for now. - I advised her to:  Patient Instructions  Please continue: - Metformin 850 mg in am and 1700 mg at night - Ozempic 1 mg weekly - Basaglar 70 units at bedtime - NovoLog 3x a day before meals 15 units before a smaller meal 20 units before a larger meal (may need 22-25 units before a  large meal)  Please return in 4 months with your sugar log.  - we checked her HbA1c: 5.9% (lower) - advised to check sugars at different times of the day - 4x a day, rotating check times - advised for yearly eye exams >> she is UTD - return to clinic in 3-4 months  2. Obesity class 3  -Continue  GLP-1 receptor agonist which will also help with weight loss -Lost 4 pounds before last visit, but gained 7 pounds since then  3. HL -Reviewed latest lipid panel from 03/2020: Fractions at goal: Lab Results  Component Value Date   CHOL 113 03/11/2020   HDL 49.50 03/11/2020   LDLCALC 39 03/11/2020   LDLDIRECT 64.0 04/21/2017   TRIG 123.0 03/11/2020   CHOLHDL 2 03/11/2020  -Continues simvastatin-ezetimibe 40-10, and also fenofibrate 160 daily without side effects   Philemon Kingdom, MD PhD [facility]

## 2020-08-20 ENCOUNTER — Other Ambulatory Visit: Payer: Self-pay | Admitting: Family Medicine

## 2020-08-20 DIAGNOSIS — E785 Hyperlipidemia, unspecified: Secondary | ICD-10-CM

## 2020-08-21 ENCOUNTER — Encounter: Payer: Self-pay | Admitting: Family Medicine

## 2020-08-21 NOTE — Telephone Encounter (Signed)
Noted. Card printed and sent to scan

## 2020-08-26 ENCOUNTER — Encounter: Payer: Self-pay | Admitting: Family Medicine

## 2020-09-07 ENCOUNTER — Other Ambulatory Visit: Payer: Self-pay | Admitting: Family Medicine

## 2020-09-07 ENCOUNTER — Encounter: Payer: Self-pay | Admitting: Family Medicine

## 2020-09-07 DIAGNOSIS — E1165 Type 2 diabetes mellitus with hyperglycemia: Secondary | ICD-10-CM

## 2020-09-08 ENCOUNTER — Encounter: Payer: Self-pay | Admitting: Internal Medicine

## 2020-09-08 ENCOUNTER — Encounter: Payer: Medicare Other | Admitting: Family Medicine

## 2020-09-08 DIAGNOSIS — E1165 Type 2 diabetes mellitus with hyperglycemia: Secondary | ICD-10-CM

## 2020-09-08 MED ORDER — METFORMIN HCL 850 MG PO TABS: ORAL_TABLET | ORAL | 1 refills | Status: DC

## 2020-09-10 ENCOUNTER — Other Ambulatory Visit: Payer: Self-pay

## 2020-09-10 ENCOUNTER — Ambulatory Visit (INDEPENDENT_AMBULATORY_CARE_PROVIDER_SITE_OTHER): Payer: Medicare Other | Admitting: Podiatry

## 2020-09-10 ENCOUNTER — Encounter: Payer: Self-pay | Admitting: Podiatry

## 2020-09-10 DIAGNOSIS — E1151 Type 2 diabetes mellitus with diabetic peripheral angiopathy without gangrene: Secondary | ICD-10-CM

## 2020-09-10 DIAGNOSIS — Z794 Long term (current) use of insulin: Secondary | ICD-10-CM | POA: Diagnosis not present

## 2020-09-10 DIAGNOSIS — B351 Tinea unguium: Secondary | ICD-10-CM | POA: Diagnosis not present

## 2020-09-10 DIAGNOSIS — L84 Corns and callosities: Secondary | ICD-10-CM

## 2020-09-10 DIAGNOSIS — M79676 Pain in unspecified toe(s): Secondary | ICD-10-CM

## 2020-09-10 NOTE — Progress Notes (Signed)
This patient returns to my office for at risk foot care.  This patient requires this care by a professional since this patient will be at risk due to having diabetes and history of thrombophlebitis.  This patient is unable to cut nails herself since the patient cannot reach her nails.These nails are painful walking and wearing shoes.  This patient presents for at risk foot care today.  General Appearance  Alert, conversant and in no acute stress.  Vascular  Dorsalis pedis and posterior tibial  pulses are not  palpable  Bilaterally  Due to swelling..  Capillary return is within normal limits  bilaterally. Temperature is within normal limits  bilaterally.  Neurologic  Senn-Weinstein monofilament wire test diminished  bilaterally. Muscle power within normal limits bilaterally.  Nails Thick disfigured discolored nails with subungual debris  from hallux to fifth toes bilaterally. No evidence of bacterial infection or drainage bilaterally.  Orthopedic  No limitations of motion  feet .  No crepitus or effusions noted.  No bony pathology or digital deformities noted.  Skin  normotropic skin with no porokeratosis noted bilaterally.  No signs of infections or ulcers noted.   Pinch callus  B/L.   No infection or drainage noted.  Onychomycosis  Pain in right toes  Pain in left toes  Skin trauma 3rd toenail left foot.  Consent was obtained for treatment procedures.   Mechanical debridement of nails 1-5  bilaterally performed with a nail nipper.  Filed with dremel without incident.    Return office visit   9 weeks                  Told patient to return for periodic foot care and evaluation due to potential at risk complications.   Culley Hedeen DPM  

## 2020-09-22 ENCOUNTER — Other Ambulatory Visit (HOSPITAL_BASED_OUTPATIENT_CLINIC_OR_DEPARTMENT_OTHER): Payer: Self-pay | Admitting: Internal Medicine

## 2020-09-22 ENCOUNTER — Encounter: Payer: Self-pay | Admitting: Family Medicine

## 2020-09-22 ENCOUNTER — Ambulatory Visit: Payer: Medicare Other | Attending: Internal Medicine

## 2020-09-22 DIAGNOSIS — Z23 Encounter for immunization: Secondary | ICD-10-CM

## 2020-09-22 MED FILL — PFIZER-BIONTECH COVID-19 VA: 30 | 1 days supply | Qty: 0 | Fill #0

## 2020-09-22 NOTE — Progress Notes (Signed)
   Covid-19 Vaccination Clinic  Name:  Kathy Wahid    MRN: [id number] DOB: [birth year]  09/22/2020  Ms. Sabet was observed post Covid-19 immunization for 15 minutes without incident. She was provided with Vaccine Information Sheet and instruction to access the V-Safe system.   Ms. Yinger was instructed to call 911 with any severe reactions post vaccine: Marland Kitchen Difficulty breathing  . Swelling of face and throat  . A fast heartbeat  . A bad rash all over body  . Dizziness and weakness   Immunizations Administered    Name Date Dose VIS Date Route   Pfizer COVID-19 Vaccine 09/22/2020 11:52 AM 0.3 mL 08/20/2020 Intramuscular   Manufacturer: [employer], [employer]   Lot: [id number]   NDC: [address]-8     Vaccine given by Arletha Pili, pharmacy student

## 2020-09-23 ENCOUNTER — Other Ambulatory Visit: Payer: Self-pay | Admitting: Family Medicine

## 2020-09-23 DIAGNOSIS — Z780 Asymptomatic menopausal state: Secondary | ICD-10-CM

## 2020-10-17 ENCOUNTER — Other Ambulatory Visit: Payer: Self-pay | Admitting: Internal Medicine

## 2020-10-17 NOTE — Telephone Encounter (Signed)
Previous Rx was for 2 MG/1.5ML ok to approve?

## 2020-10-30 ENCOUNTER — Other Ambulatory Visit: Payer: Self-pay

## 2020-10-30 ENCOUNTER — Ambulatory Visit (INDEPENDENT_AMBULATORY_CARE_PROVIDER_SITE_OTHER): Payer: Medicare Other | Admitting: Family Medicine

## 2020-10-30 ENCOUNTER — Ambulatory Visit (INDEPENDENT_AMBULATORY_CARE_PROVIDER_SITE_OTHER): Payer: Medicare Other

## 2020-10-30 ENCOUNTER — Encounter: Payer: Self-pay | Admitting: Family Medicine

## 2020-10-30 VITALS — BP 154/80 | HR 88 | Ht 63.0 in | Wt 271.0 lb

## 2020-10-30 DIAGNOSIS — M170 Bilateral primary osteoarthritis of knee: Secondary | ICD-10-CM

## 2020-10-30 DIAGNOSIS — M25562 Pain in left knee: Secondary | ICD-10-CM

## 2020-10-30 DIAGNOSIS — G8929 Other chronic pain: Secondary | ICD-10-CM

## 2020-10-30 IMAGING — DX DG KNEE COMPLETE 4+V*L*
4 series · 4 of 4 positions shown · non-contrast
Comparison: None.

CLINICAL DATA: Left knee pain

EXAM:
LEFT KNEE - COMPLETE 4+ VIEW

[knee ap]
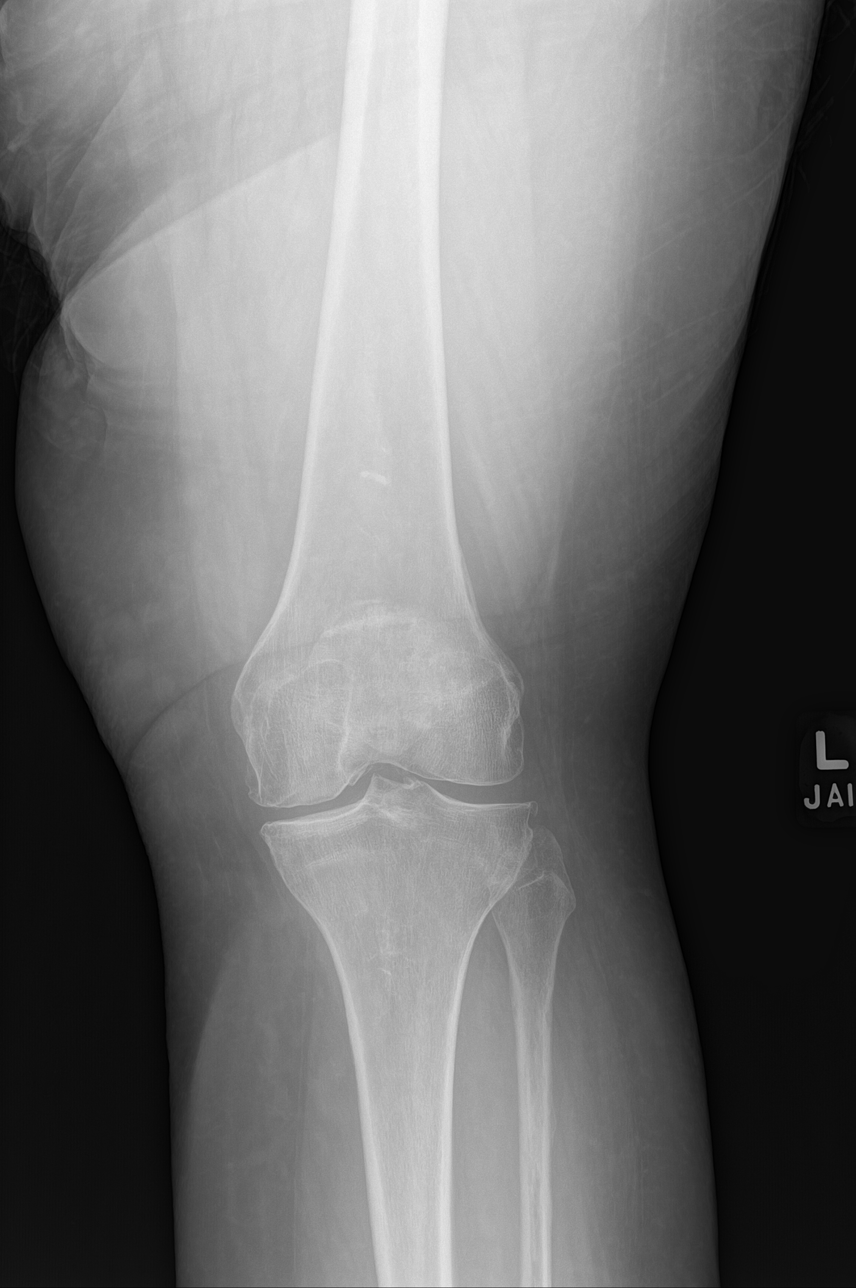

[knee obl (1 of 2)]
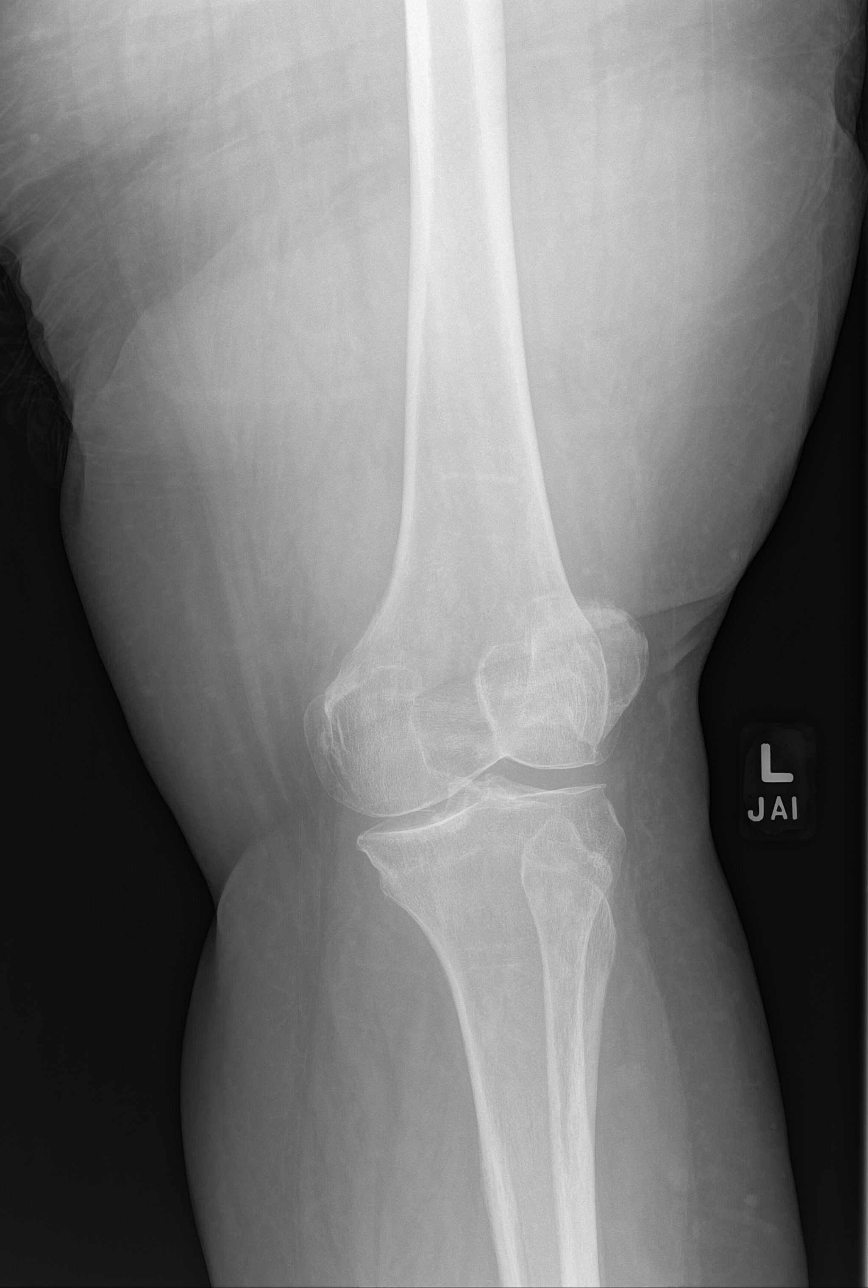

[knee obl (2 of 2)]
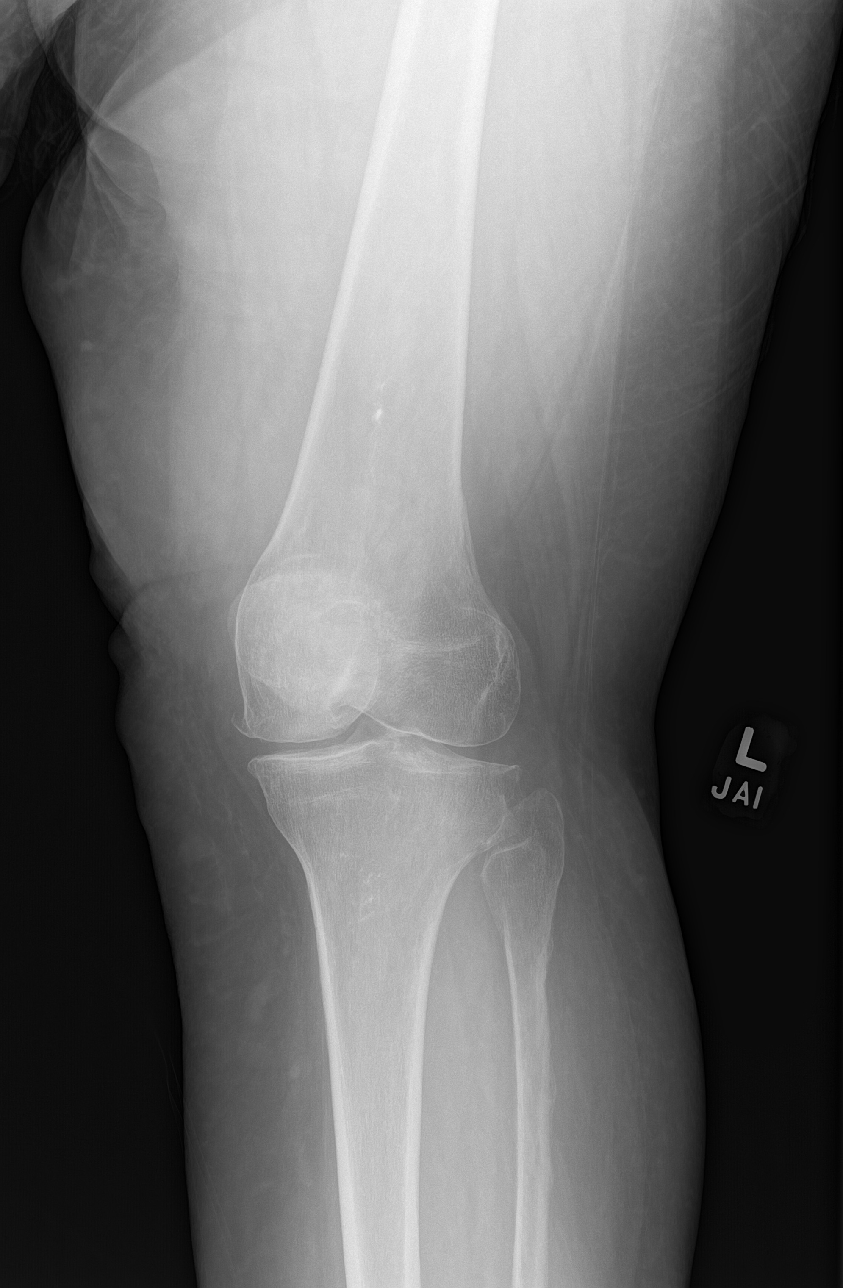

[knee lat]
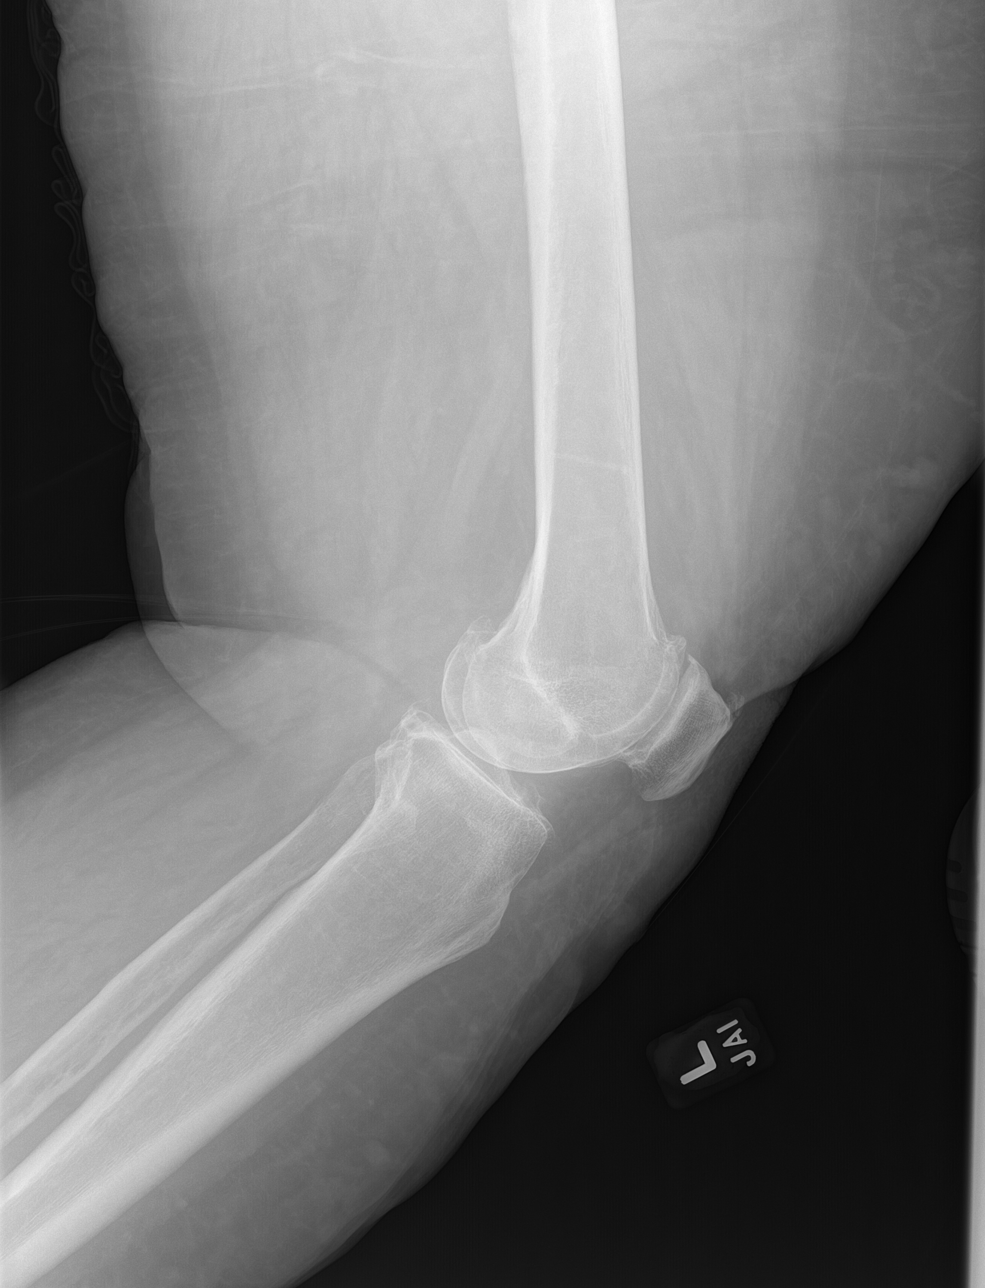

[4 of 4 positions shown; findings below may reference images not displayed]

FINDINGS: No fracture or malalignment. Tricompartment arthritis with moderate
patellofemoral degenerative change. Trace knee effusion.
IMPRESSION: Tricompartment arthritis with trace knee effusion.

## 2020-10-30 IMAGING — DX DG KNEE STANDING AP BILAT
1 series · 1 of 1 positions shown · non-contrast
Comparison: None.

CLINICAL DATA: Bilateral knee pain

EXAM:
BILATERAL KNEES STANDING - 1 VIEW

[knee ap]
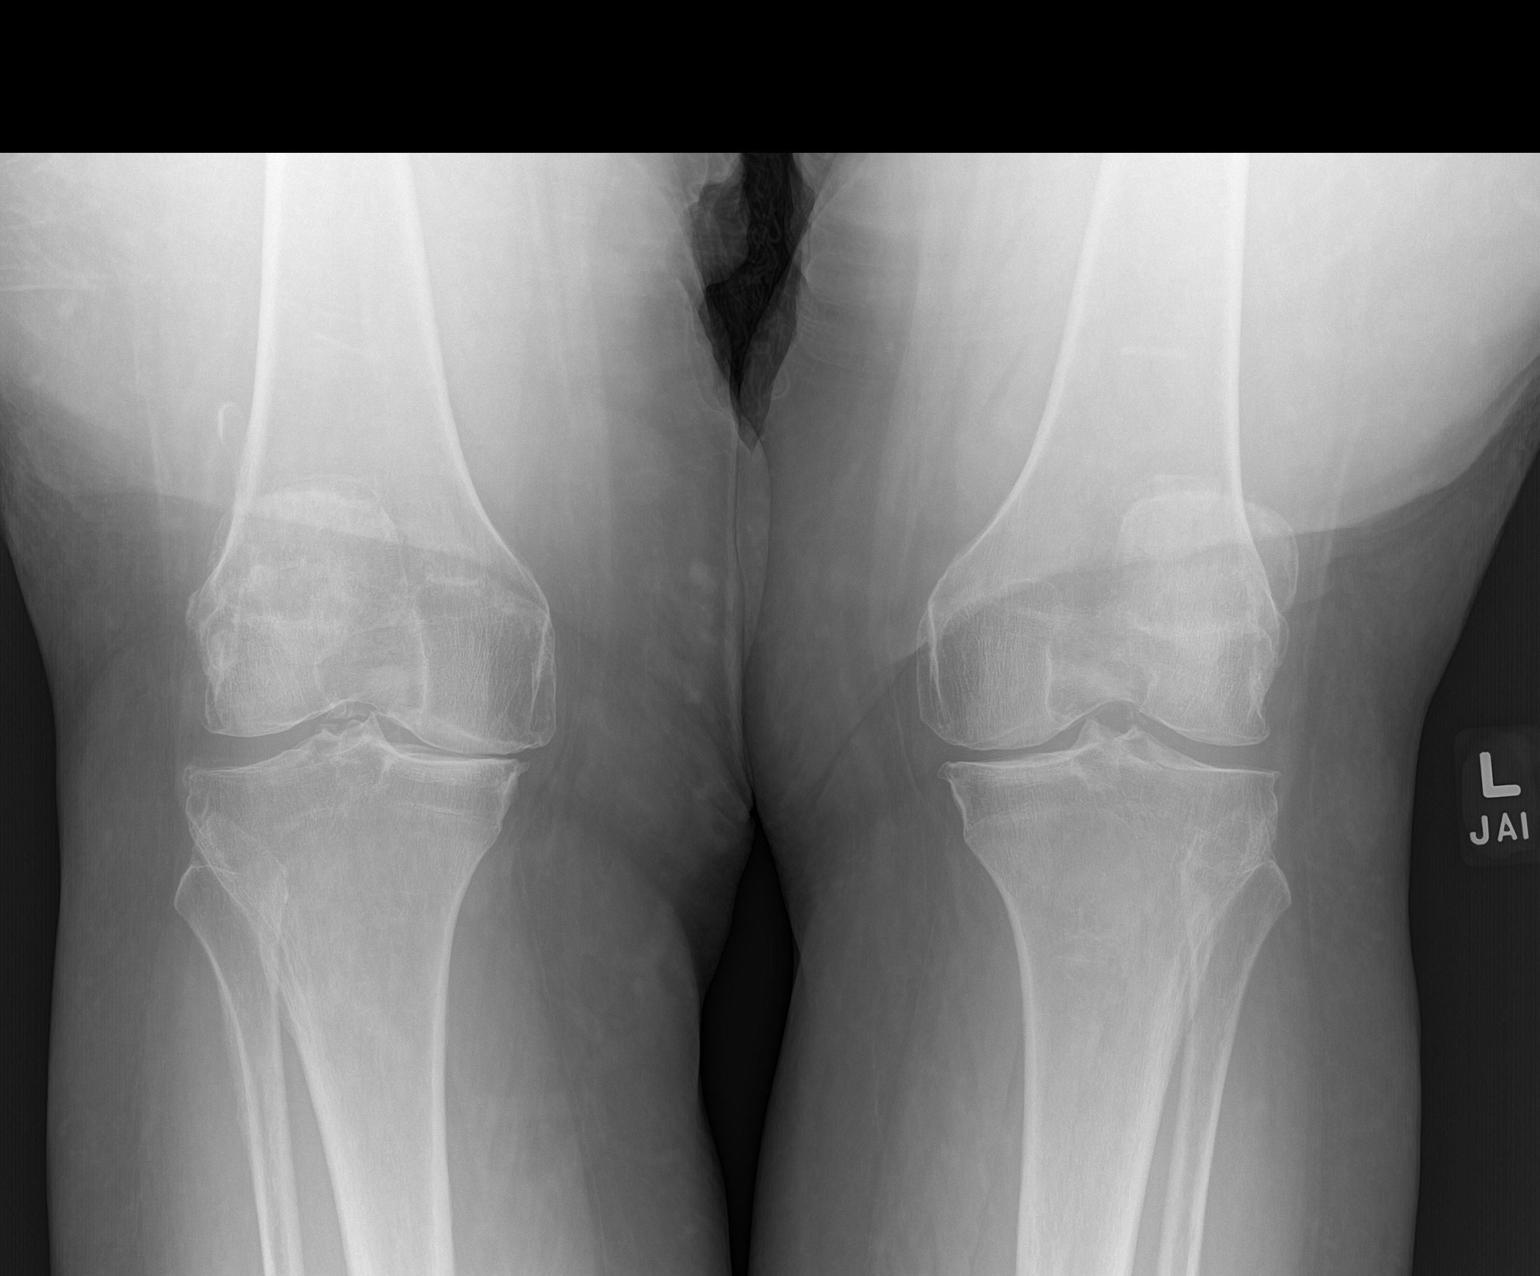

[1 of 1 positions shown; findings below may reference images not displayed]

FINDINGS: Mild joint space narrowing medially at the left knee. Moderate joint
space narrowing medially of the right knee with mild compensatory
widening of the lateral joint space. No fracture identified.
IMPRESSION: Degenerative changes of the bilateral knees, most notable involving
the medial compartment of the right knee.

## 2020-10-30 NOTE — Assessment & Plan Note (Addendum)
Bilateral injections given.  Tolerated the procedure well, discussed icing regimen and home exercises, discussed which activities to do which wants to avoid.encouraged weight loss.  Patient could be a candidate for viscosupplementation.  We will keep her fingers crossed.  Patient for surgical intervention if she can.  Patient will follow up with me again 6 weeks  Patient was told if there is any type of aggravation over the weekend because it is a holiday patient will need to see urgent care or emergency room

## 2020-10-30 NOTE — Patient Instructions (Addendum)
Left knee xray today Ice 20 mins 2 times a day Voltaren over arnica lotion  Voltaren gel over the counter Exercise 3 times a week See me again in 6 weeks

## 2020-10-30 NOTE — Progress Notes (Signed)
[place] [address] [place] Phone: [phone] Subjective:   I [name] am serving as a Education administrator for Dr. Hulan Saas.  This visit occurred during the SARS-CoV-2 public health emergency.  Safety protocols were in place, including screening questions prior to the visit, additional usage of staff PPE, and extensive cleaning of exam room while observing appropriate contact time as indicated for disinfecting solutions.   I'm seeing this patient by the request  of:  [name], DO  CC: Left knee pain  [id number]   03/28/2020 Arthritic changes noted.  Chronic problem with exacerbation.  Viscosupplementation given today.  Has tolerated the procedure previously as well and has had good resolution results.  Hopefully this will continue.  Encourage weight loss, icing regimen and topical anti-inflammatories.  Follow-up again in 6 weeks if worsening symptoms otherwise follow-up as needed  Update 10/30/2020 Andrea Marks is a 69 y.o. female coming in with complaint of left knee pain. Right knee injected with visco in May 2021. Patient states the past few weeks the knee has been keeping her up at night. States it feels like some one is sticking a knife in her knee at the patellar tendon. Has been icing the knee. 7/10 at its worse.   Patient has known arthritic changes of the contralateral knee.  Is starting to have increasing discomfort with that as well.  Patient describes the pain as a dull, throbbing aching pain on the right side.  Patient has had mild instability of both knees and does walk with the aid of a cane.     Patient did have x-rays taken today.  These were independently visualized by me and over read is pending.  There are patellofemoral arthritic changes of the left knee.  Severe medial compartment moderate patellofemoral on the right knee  Past Medical History:  Diagnosis Date  . Depression   . Diabetes  mellitus   . GERD (gastroesophageal reflux disease)   . Hyperlipidemia   . Hypertension   . Obesity    Past Surgical History:  Procedure Laterality Date  . ABDOMINAL HYSTERECTOMY  1991   BSO   Social History   Socioeconomic History  . Marital status: Married    Spouse name: Not on file  . Number of children: 0  . Years of education: 48  . Highest education level: Not on file  Occupational History  . Occupation: retired from Surveyor, minerals: RETIRED  Tobacco Use  . Smoking status: Former Smoker    Quit date: 1995    Years since quitting: 27.0  . Smokeless tobacco: Never Used  Substance and Sexual Activity  . Alcohol use: No  . Drug use: No  . Sexual activity: Yes    Partners: Male  Other Topics Concern  . Not on file  Social History Narrative   No reg exercise   Social Determinants of Health   Financial Resource Strain: Not on file  Food Insecurity: Not on file  Transportation Needs: Not on file  Physical Activity: Not on file  Stress: Not on file  Social Connections: Not on file   Allergies  Allergen Reactions  . Hydrocodone-Acetaminophen Other (See Comments)    unknown  . Oxycodone Hcl Other (See Comments)    unknown  . Penicillins Other (See Comments)    unknown  . Prednisone    Family History  Problem Relation Age of Onset  . Macular degeneration Mother   .  Heart disease Mother        syncope  . Aortic stenosis Mother   . Lung disease Father 70       mesothelioma  . Cancer Maternal Aunt        stomach  . Heart disease Paternal Uncle        cabg  . Diabetes Paternal Uncle   . Heart disease Paternal Uncle   . Diabetes Paternal Uncle   . Diabetes Paternal Uncle   . Heart disease Paternal Uncle   . Heart disease Paternal Uncle   . Heart disease Paternal Uncle   . Lung cancer Other        asbestos  . Diabetes Paternal Grandmother   . Cancer Other        lung    Current Outpatient Medications (Endocrine & Metabolic):  .   estradiol (ESTRACE) 1 MG tablet, Take 1 tablet (1 mg total) by mouth daily. .  insulin aspart (NOVOLOG FLEXPEN) 100 UNIT/ML FlexPen, Inject 20-30 Units into the skin 3 (three) times daily with meals. .  Insulin Glargine (BASAGLAR KWIKPEN) 100 UNIT/ML, Inject 0.6 mLs (60 Units total) into the skin at bedtime. (Patient taking differently: Inject 70 Units into the skin at bedtime.) .  metFORMIN (GLUCOPHAGE) 850 MG tablet, Take 1 tablet in the AM, and 2 tablets in the PM. .  OZEMPIC, 1 MG/DOSE, 4 MG/3ML SOPN, INJECT $RemoveBefo'1MG'EhgWDVBbbmx$  SUBCUTANEOUSLY  ONCE A WEEK. .  Semaglutide, 1 MG/DOSE, (OZEMPIC, 1 MG/DOSE,) 2 MG/1.5ML SOPN, Inject 1 mg into the skin once a week.   Current Outpatient Medications (Cardiovascular):  .  benazepril (LOTENSIN) 20 MG tablet, Take 1 tablet (20 mg total) by mouth daily. Marland Kitchen  ezetimibe-simvastatin (VYTORIN) 10-40 MG tablet, Take 1 tablet by mouth at bedtime. .  fenofibrate 160 MG tablet, 1 po qd    Current Facility-Administered Medications (Respiratory):  .  ipratropium-albuterol (DUONEB) 0.5-2.5 (3) MG/3ML nebulizer solution 3 mL  Current Outpatient Medications (Analgesics):  .  acetaminophen (TYLENOL) 650 MG CR tablet, Take 650 mg by mouth 2 (two) times daily as needed. For pain .  aspirin 81 MG tablet, Take 81 mg by mouth daily.     Current Outpatient Medications (Other):  .  Blood Glucose Monitoring Suppl (ONE TOUCH ULTRA SYSTEM KIT) W/DEVICE KIT, 1 kit by Does not apply route once. .  Continuous Blood Gluc Receiver (FREESTYLE LIBRE 14 DAY READER) DEVI, 1 Device by Does not apply route See admin instructions. Use for continuous monitor glucose .  Continuous Blood Gluc Sensor (FREESTYLE LIBRE 14 DAY SENSOR) MISC, Apply new sensor every 14 days for continuous glucose monitoring. Marland Kitchen  FLUoxetine (PROZAC) 20 MG tablet, Take 3 tablets (60 mg total) by mouth daily. Marland Kitchen  glucose blood (ONETOUCH ULTRA) test strip, Use to check blood sugar 3 times a day .  Insulin Pen Needle (NOVOFINE)  32G X 6 MM MISC, To use w/ Lantus .  pantoprazole (PROTONIX) 40 MG tablet, Take 1 tablet (40 mg total) by mouth daily.    Reviewed prior external information including notes and imaging from  primary care provider As well as notes that were available from care everywhere and other healthcare systems.  Past medical history, social, surgical and family history all reviewed in electronic medical record.  No pertanent information unless stated regarding to the chief complaint.   Review of Systems:  No headache, visual changes, nausea, vomiting, diarrhea, constipation, dizziness, abdominal pain, skin rash, fevers, chills, night sweats, weight loss, swollen lymph nodes, body aches,  joint swelling, chest pain, shortness of breath, mood changes. POSITIVE muscle aches  Objective  Blood pressure (!) 154/80, pulse 88, height $RemoveBe'5\' 3"'BrUlYlHKv$  (1.6 m), weight 271 lb (122.9 kg), SpO2 96 %.   General: No apparent distress alert and oriented x3 mood and affect normal, dressed appropriately.  HEENT: Pupils equal, extraocular movements intact  Respiratory: Patient's speak in full sentences and does not appear short of breath  Cardiovascular: 1+ lower extremity edema, non tender, no erythema patient does have significant varicose veins left greater than right of the lower extremities.  No tenderness noted in the calf bilaterally Antalgic gait walking with the aid of a cane Knee: Bilateral valgus [place] deformity noted.  Abnormal thigh to calf ratio.  Tender to palpation over medial and PF joint line.  Patient is actually more tender over the anterior aspect of the knee on the left than the right ROM lacks last 5 degrees of flexion of the left compared to the right instability with valgus force.  painful patellar compression. Patellar glide with moderate to severe crepitus.   After informed written and verbal consent, patient was seated on exam table. Right knee was prepped with alcohol swab and utilizing anterolateral  approach, patient's right knee space was injected with 4:1  marcaine 0.5%: Kenalog $RemoveBefor'40mg'XMrwIWEsAhPy$ /dL. Patient tolerated the procedure well without immediate complications.  After informed written and verbal consent, patient was seated on exam table. Left knee was prepped with alcohol swab and utilizing anterolateral approach, patient's left knee space was injected with 4:1  marcaine 0.5%: Kenalog $RemoveBefor'40mg'ytYzKKFkVOqj$ /dL. Patient tolerated the procedure well without immediate complications.  Impression and Recommendations:     The above documentation has been reviewed and is accurate and complete Lyndal Pulley, DO

## 2020-11-10 ENCOUNTER — Encounter: Payer: Self-pay | Admitting: Internal Medicine

## 2020-11-10 DIAGNOSIS — E1165 Type 2 diabetes mellitus with hyperglycemia: Secondary | ICD-10-CM

## 2020-11-10 MED ORDER — INSULIN PEN NEEDLE 32G X 6 MM MISC: 3 refills | Status: DC

## 2020-11-12 ENCOUNTER — Encounter: Payer: Self-pay | Admitting: Internal Medicine

## 2020-11-12 DIAGNOSIS — E1165 Type 2 diabetes mellitus with hyperglycemia: Secondary | ICD-10-CM

## 2020-11-14 ENCOUNTER — Ambulatory Visit: Payer: Medicare Other | Admitting: Podiatry

## 2020-11-18 ENCOUNTER — Other Ambulatory Visit: Payer: Self-pay | Admitting: Family Medicine

## 2020-11-18 ENCOUNTER — Ambulatory Visit: Payer: Medicare Other | Admitting: Podiatry

## 2020-11-18 DIAGNOSIS — I10 Essential (primary) hypertension: Secondary | ICD-10-CM

## 2020-11-18 DIAGNOSIS — E785 Hyperlipidemia, unspecified: Secondary | ICD-10-CM

## 2020-12-05 ENCOUNTER — Other Ambulatory Visit: Payer: Self-pay

## 2020-12-05 ENCOUNTER — Encounter: Payer: Self-pay | Admitting: Podiatry

## 2020-12-05 ENCOUNTER — Ambulatory Visit (INDEPENDENT_AMBULATORY_CARE_PROVIDER_SITE_OTHER): Payer: Medicare Other | Admitting: Podiatry

## 2020-12-05 DIAGNOSIS — M79676 Pain in unspecified toe(s): Secondary | ICD-10-CM

## 2020-12-05 DIAGNOSIS — B351 Tinea unguium: Secondary | ICD-10-CM | POA: Diagnosis not present

## 2020-12-05 DIAGNOSIS — L84 Corns and callosities: Secondary | ICD-10-CM

## 2020-12-05 DIAGNOSIS — Z794 Long term (current) use of insulin: Secondary | ICD-10-CM

## 2020-12-05 DIAGNOSIS — E1151 Type 2 diabetes mellitus with diabetic peripheral angiopathy without gangrene: Secondary | ICD-10-CM

## 2020-12-05 NOTE — Progress Notes (Signed)
This patient returns to my office for at risk foot care.  This patient requires this care by a professional since this patient will be at risk due to having diabetes and history of thrombophlebitis.  This patient is unable to cut nails herself since the patient cannot reach her nails.These nails are painful walking and wearing shoes.  This patient presents for at risk foot care today.  General Appearance  Alert, conversant and in no acute stress.  Vascular  Dorsalis pedis and posterior tibial  pulses are not  palpable  Bilaterally  Due to swelling..  Capillary return is within normal limits  bilaterally. Temperature is within normal limits  bilaterally.  Neurologic  Senn-Weinstein monofilament wire test diminished  bilaterally. Muscle power within normal limits bilaterally.  Nails Thick disfigured discolored nails with subungual debris  from hallux to fifth toes bilaterally. No evidence of bacterial infection or drainage bilaterally.  Orthopedic  No limitations of motion  feet .  No crepitus or effusions noted.  No bony pathology or digital deformities noted.  Skin  normotropic skin with no porokeratosis noted bilaterally.  No signs of infections or ulcers noted.   Pinch callus  B/L.   No infection or drainage noted.  Onychomycosis  Pain in right toes  Pain in left toes  Skin trauma 3rd toenail left foot.  Consent was obtained for treatment procedures.   Mechanical debridement of nails 1-5  bilaterally performed with a nail nipper.  Filed with dremel without incident.    Return office visit   9 weeks                  Told patient to return for periodic foot care and evaluation due to potential at risk complications.   Helane Gunther DPM

## 2020-12-08 MED ORDER — INSULIN PEN NEEDLE 32G X 4 MM MISC: 3 refills | Status: DC

## 2020-12-08 NOTE — Telephone Encounter (Signed)
New Rx sent to preferred pharmacy. Generic brand covered by insurance.

## 2020-12-09 LAB — HM DIABETES EYE EXAM

## 2020-12-10 ENCOUNTER — Other Ambulatory Visit: Payer: Self-pay | Admitting: Internal Medicine

## 2020-12-10 DIAGNOSIS — E1165 Type 2 diabetes mellitus with hyperglycemia: Secondary | ICD-10-CM

## 2020-12-10 NOTE — Progress Notes (Signed)
[facility] [place] Phone: [phone] Subjective:   Andrea Marks, am serving as a scribe for Dr. Hulan Saas.  This visit occurred during the SARS-CoV-2 public health emergency.  Safety protocols were in place, including screening questions prior to the visit, additional usage of staff PPE, and extensive cleaning of exam room while observing appropriate contact time as indicated for disinfecting solutions.   I'm seeing this patient by the request  of:  [name], DO  CC: Bilateral knee pain  URK:[id number]   10/30/2020 Bilateral injections given.  Tolerated the procedure well, discussed icing regimen and home exercises, discussed which activities to do which wants to avoid.encouraged weight loss.  Patient could be a candidate for viscosupplementation.  We will keep her fingers crossed.  Patient for surgical intervention if she can.  Patient will follow up with me again 6 weeks  Patient was told if there is any type of aggravation over the weekend because it is a holiday patient will need to see urgent care or emergency room  Update 12/11/2020 Andrea Marks is a 70 y.o. female coming in with complaint of bilateral knee pain.  Steroid injection given in December. Bilateral Durolane approved for patient. Patient states that the L knee is sore but the R knee is miserable.  Patient has known arthritic changes in hands bilaterally.  Starting to affect all daily activities.  Increasing instability of the knees    Patient's x-rays have shown that patient does have mild to moderate arthritic changes of the left knee and moderate to severe arthritic changes of the right knee  Past Medical History:  Diagnosis Date  . Depression   . Diabetes mellitus   . GERD (gastroesophageal reflux disease)   . Hyperlipidemia   . Hypertension   . Obesity    Past Surgical History:  Procedure Laterality Date  . ABDOMINAL  HYSTERECTOMY  1991   BSO   Social History   Socioeconomic History  . Marital status: Married    Spouse name: Not on file  . Number of children: 0  . Years of education: 70  . Highest education level: Not on file  Occupational History  . Occupation: retired from Surveyor, minerals: RETIRED  Tobacco Use  . Smoking status: Former Smoker    Quit date: 1995    Years since quitting: 27.1  . Smokeless tobacco: Never Used  Substance and Sexual Activity  . Alcohol use: No  . Drug use: No  . Sexual activity: Yes    Partners: Male  Other Topics Concern  . Not on file  Social History Narrative   No reg exercise   Social Determinants of Health   Financial Resource Strain: Not on file  Food Insecurity: Not on file  Transportation Needs: Not on file  Physical Activity: Not on file  Stress: Not on file  Social Connections: Not on file   Allergies  Allergen Reactions  . Hydrocodone-Acetaminophen Other (See Comments)    unknown  . Oxycodone Hcl Other (See Comments)    unknown  . Penicillins Other (See Comments)    unknown  . Prednisone    Family History  Problem Relation Age of Onset  . Macular degeneration Mother   . Heart disease Mother        syncope  . Aortic stenosis Mother   . Lung disease Father 50       mesothelioma  . Cancer Maternal  Aunt        stomach  . Heart disease Paternal Uncle        cabg  . Diabetes Paternal Uncle   . Heart disease Paternal Uncle   . Diabetes Paternal Uncle   . Diabetes Paternal Uncle   . Heart disease Paternal Uncle   . Heart disease Paternal Uncle   . Heart disease Paternal Uncle   . Lung cancer Other        asbestos  . Diabetes Paternal Grandmother   . Cancer Other        lung    Current Outpatient Medications (Endocrine & Metabolic):  .  estradiol (ESTRACE) 1 MG tablet, Take 1 tablet (1 mg total) by mouth daily. .  insulin aspart (NOVOLOG FLEXPEN) 100 UNIT/ML FlexPen, Inject 20-30 Units into the skin 3 (three) times  daily with meals. .  Insulin Glargine (BASAGLAR KWIKPEN) 100 UNIT/ML, Inject 0.6 mLs (60 Units total) into the skin at bedtime. (Patient taking differently: Inject 70 Units into the skin at bedtime.) .  metFORMIN (GLUCOPHAGE) 850 MG tablet, Take 1 tablet in the AM, and 2 tablets in the PM. .  OZEMPIC, 1 MG/DOSE, 4 MG/3ML SOPN, INJECT $RemoveBefo'1MG'JIBsOwZxNux$  SUBCUTANEOUSLY  ONCE A WEEK. .  Semaglutide, 1 MG/DOSE, (OZEMPIC, 1 MG/DOSE,) 2 MG/1.5ML SOPN, Inject 1 mg into the skin once a week.   Current Outpatient Medications (Cardiovascular):  .  benazepril (LOTENSIN) 20 MG tablet, TAKE 1 TABLET DAILY .  ezetimibe-simvastatin (VYTORIN) 10-40 MG tablet, TAKE 1 TABLET AT BEDTIME .  fenofibrate 160 MG tablet, 1 po qd    Current Facility-Administered Medications (Respiratory):  .  ipratropium-albuterol (DUONEB) 0.5-2.5 (3) MG/3ML nebulizer solution 3 mL  Current Outpatient Medications (Analgesics):  .  acetaminophen (TYLENOL) 650 MG CR tablet, Take 650 mg by mouth 2 (two) times daily as needed. For pain .  aspirin 81 MG tablet, Take 81 mg by mouth daily.     Current Outpatient Medications (Other):  .  Blood Glucose Monitoring Suppl (ONE TOUCH ULTRA SYSTEM KIT) W/DEVICE KIT, 1 kit by Does not apply route once. .  Continuous Blood Gluc Receiver (FREESTYLE LIBRE 14 DAY READER) DEVI, 1 Device by Does not apply route See admin instructions. Use for continuous monitor glucose .  Continuous Blood Gluc Sensor (FREESTYLE LIBRE 14 DAY SENSOR) MISC, Apply new sensor every 14 days for continuous glucose monitoring. Marland Kitchen  FLUoxetine (PROZAC) 20 MG tablet, Take 3 tablets (60 mg total) by mouth daily. Marland Kitchen  glucose blood (ONETOUCH ULTRA) test strip, Use to check blood sugar 3 times a day .  Insulin Pen Needle (PEN NEEDLES) 32G X 4 MM MISC, USE AS INSTRUCTED WITH LANCETS. .  pantoprazole (PROTONIX) 40 MG tablet, Take 1 tablet (40 mg total) by mouth daily.    Reviewed prior external information including notes and imaging from   primary care provider As well as notes that were available from care everywhere and other healthcare systems.  Past medical history, social, surgical and family history all reviewed in electronic medical record.  No pertanent information unless stated regarding to the chief complaint.   Review of Systems:  No headache, visual changes, nausea, vomiting, diarrhea, constipation, dizziness, abdominal pain, skin rash, fevers, chills, night sweats, weight loss, swollen lymph nodes, body aches,  chest pain, shortness of breath, mood changes. POSITIVE muscle aches, joint swelling  Objective  Blood pressure (!) 160/70, pulse 100, height $RemoveBef'5\' 3"'tjRLNmqCSf$  (1.6 m), weight 270 lb (122.5 kg), SpO2 93 %.   General: No  apparent distress alert and oriented x3 mood and affect normal, dressed appropriately.  Morbidly obese HEENT: Pupils equal, extraocular movements intact  Respiratory: Patient's speak in full sentences and does not appear short of breath  Cardiovascular: 1+ lower extremity edema, non tender, no erythema  Gait antalgic gait using a cane MSK:  Knee: Bilateral valgus deformity noted. Large thigh to calf ratio.  Tender to palpation over medial and PF joint line.  ROM limited range of motion lacking the last 5 degrees of extension in the last 5 degrees of flexion instability with valgus force.  painful patellar compression. Patellar glide with moderate crepitus. Patellar and quadriceps tendons unremarkable. Hamstring and quadriceps strength is normal.  After informed written and verbal consent, patient was seated on exam table. Right knee was prepped with alcohol swab and utilizing anterolateral approach, patient's right knee space was injected with 60 mg per 3 mL of Durolane(sodium hyaluronate) in a prefilled syringe was injected easily into the knee through a 22-gauge needle..Patient tolerated the procedure well without immediate complications.  After informed written and verbal consent, patient was  seated on exam table. Left knee was prepped with alcohol swab and utilizing anterolateral approach, patient's left knee space was injected with 60 mg per 3 mL of Durolane(sodium hyaluronate) in a prefilled syringe was injected easily into the knee through a 22-gauge needle..Patient tolerated the procedure well without immediate complications.    Impression and Recommendations:     The above documentation has been reviewed and is accurate and complete Lyndal Pulley, DO

## 2020-12-11 ENCOUNTER — Encounter: Payer: Self-pay | Admitting: Family Medicine

## 2020-12-11 ENCOUNTER — Ambulatory Visit (INDEPENDENT_AMBULATORY_CARE_PROVIDER_SITE_OTHER): Payer: Medicare Other | Admitting: Family Medicine

## 2020-12-11 ENCOUNTER — Telehealth: Payer: Self-pay | Admitting: Internal Medicine

## 2020-12-11 ENCOUNTER — Other Ambulatory Visit: Payer: Self-pay

## 2020-12-11 DIAGNOSIS — M170 Bilateral primary osteoarthritis of knee: Secondary | ICD-10-CM

## 2020-12-11 NOTE — Patient Instructions (Signed)
Good to see you u Can take up to a month to work.  Ice 20 minutes 2 times daily. Usually after activity and before bed. Continue everything else If any redness, swelling and increasing pain seek medical attention immediately See me again in 6-8 weeks

## 2020-12-11 NOTE — Assessment & Plan Note (Signed)
Patient does have arthritic changes right greater than left.  Still seems to be at moderate category.  Hopefully patient will respond well to the viscosupplementation.  We did discuss that can take about a month to work.  Patient will continue conservative therapy and see me again in 2 months

## 2020-12-11 NOTE — Telephone Encounter (Signed)
[facility] called just wanting a nurse to verify instructions for the test strips along with needing to know the exact type of test strips to order because she said it was not specified.   Ph# [phone]  reference# [id number]

## 2020-12-12 ENCOUNTER — Telehealth: Payer: Self-pay | Admitting: Internal Medicine

## 2020-12-12 ENCOUNTER — Encounter: Payer: Self-pay | Admitting: Internal Medicine

## 2020-12-12 NOTE — Telephone Encounter (Signed)
Called and clarified Rx

## 2020-12-12 NOTE — Telephone Encounter (Signed)
Andrea Marks with [facility] requests to be called at ph# [phone], Reference# [id number] re: Clarification of RX for Pen Needles. RX on hold until call received for clarification.

## 2020-12-15 ENCOUNTER — Other Ambulatory Visit: Payer: Self-pay | Admitting: Internal Medicine

## 2020-12-18 ENCOUNTER — Other Ambulatory Visit: Payer: Self-pay

## 2020-12-18 ENCOUNTER — Ambulatory Visit (INDEPENDENT_AMBULATORY_CARE_PROVIDER_SITE_OTHER): Payer: Medicare Other | Admitting: Internal Medicine

## 2020-12-18 ENCOUNTER — Encounter: Payer: Self-pay | Admitting: Internal Medicine

## 2020-12-18 VITALS — BP 130/82 | HR 90 | Ht 63.0 in | Wt 278.4 lb

## 2020-12-18 DIAGNOSIS — E11319 Type 2 diabetes mellitus with unspecified diabetic retinopathy without macular edema: Secondary | ICD-10-CM | POA: Diagnosis not present

## 2020-12-18 DIAGNOSIS — E785 Hyperlipidemia, unspecified: Secondary | ICD-10-CM | POA: Diagnosis not present

## 2020-12-18 DIAGNOSIS — E1165 Type 2 diabetes mellitus with hyperglycemia: Secondary | ICD-10-CM | POA: Diagnosis not present

## 2020-12-18 DIAGNOSIS — Z794 Long term (current) use of insulin: Secondary | ICD-10-CM | POA: Diagnosis not present

## 2020-12-18 LAB — POCT GLYCOSYLATED HEMOGLOBIN (HGB A1C): Hemoglobin A1C: 6 % — AB (ref 4.0–5.6)

## 2020-12-18 MED ORDER — BASAGLAR KWIKPEN 100 UNIT/ML ~~LOC~~ SOPN: 60.0000 [IU] | PEN_INJECTOR | Freq: Every day | SUBCUTANEOUS | 3 refills | Status: DC

## 2020-12-18 MED ORDER — OZEMPIC (1 MG/DOSE) 4 MG/3ML ~~LOC~~ SOPN: PEN_INJECTOR | SUBCUTANEOUS | 3 refills | Status: DC

## 2020-12-18 MED ORDER — NOVOLOG FLEXPEN 100 UNIT/ML ~~LOC~~ SOPN: 18.0000 [IU] | PEN_INJECTOR | Freq: Three times a day (TID) | SUBCUTANEOUS | 3 refills | Status: DC

## 2020-12-18 MED ORDER — METFORMIN HCL 850 MG PO TABS: ORAL_TABLET | ORAL | 3 refills | Status: DC

## 2020-12-18 NOTE — Patient Instructions (Addendum)
Please continue: - Metformin 850 mg in am and 1700 mg at night - Ozempic 1 mg weekly - NovoLog 3x a day before meals 15 units before a smaller meal 20 units before a larger meal (may need 22-25 units before a large meal)  Please decrease: - Basaglar 60 units at bedtime  Please let me know if the sugars continue to drop after decreasing Basaglar.  Please return in 4 months.

## 2020-12-18 NOTE — Progress Notes (Signed)
Patient ID: Andrea Marks, female   DOB: Nov 09, 1950, 70 y.o.   MRN: [id number]  This visit occurred during the SARS-CoV-2 public health emergency.  Safety protocols were in place, including screening questions prior to the visit, additional usage of staff PPE, and extensive cleaning of exam room while observing appropriate contact time as indicated for disinfecting solutions.   HPI: Andrea Marks is a 70 y.o.-year-old female, returning for f/u for DM2, dx 1994, insulin-dependent, uncontrolled, with complications (diabetic retinopathy). Last visit was 4 months ago.  Reviewed HbA1c levels: Lab Results  Component Value Date   HGBA1C 5.9 (A) 08/14/2020   HGBA1C 6.9 (A) 04/14/2020   HGBA1C 7.3 (A) 01/04/2020   Currently on: - Metformin 850 mg in am and  1700 mg at night - Ozempic 1 mg weekly - Basaglar 60 units at bedtime >> Basaglar 70 units at bedtime - NovoLog 3x a day before meals 15 units before a smaller meal 20 units before a larger meal (22 units before a large meal) Tried Bydureon once a week >> she had problems injecting the medication.  She also tried Victoza. She was on Humalog in the past. She was on Amaryl in the past, which we stopped going to increase her insulin doses 05/2018. She was on a Vigo pump, which we stopped in 2019 as she needed higher doses of insulin.  She checks her sugars more than 4 times a day with her freestyle CGM:  Freestyle libre CGM parameters: - Average: 131 >> 146 >> 128 >> 125 - % active CGM time: 17% >> 80% of the time - Glucose variability 30.2%>> 27.3% >> 32.1% >> 38.4% (target < or = to 36%) - GMI: 6.3% - time in range:  - very low (<54): 2% >> 0% >> 0% >> 0% - low (54-69): 9% >> 1% >> 6% >> 9% - normal range (70-180): 79% >> 82% >> 82% >> 80% - high sugars (181-250): 10% >> 15% >> 11% >> 9% - very high sugars (>250): 0% >> 2% >> 1% >> 2%    Previously:   Previously:   Lowest blood sugars: 50s Highest blood sugars:  300s  Meals: - Breakfast: egg + bagel + yoghurt - Lunch: may skip, salad - Dinner: salad, hamburger/hot dog, tuna fish - Snacks: potato chips, pretzel No sodas, only drinks sparkling water.  No CKD, last BUN/creatinine was:  Lab Results  Component Value Date   BUN 22 03/11/2020   CREATININE 0.98 03/11/2020  On benazepril. + HL; Last set of lipids: Lab Results  Component Value Date   CHOL 113 03/11/2020   HDL 49.50 03/11/2020   LDLCALC 39 03/11/2020   LDLDIRECT 64.0 04/21/2017   TRIG 123.0 03/11/2020   CHOLHDL 2 03/11/2020  On Vytorin and fenofibrate. - Last eye exam: 12/09/2020: No DR reportedly, but she did have DR in the past. + macular edema OS. May need eye injections. She had cataract surgery. - + numbness and tingling in her feet.  She sees podiatry-Dr. Prudence Davidson. She had an ulcer in lower right leg (has lymphedema) >> healed.  She also has a history of HTN, GERD, depression, h/o thrombophlebitis.  Previously on B12 injections now on 1000 mcg p.o. B12 daily.  Her husband has lung cancer.  ROS: Constitutional: no weight gain/no weight loss, no fatigue, no subjective hyperthermia, no subjective hypothermia Eyes: no blurry vision, no xerophthalmia ENT: no sore throat, no nodules palpated in neck, no dysphagia, no odynophagia, no hoarseness Cardiovascular: no CP/no SOB/no palpitations/no leg  swelling Respiratory: no cough/no SOB/no wheezing Gastrointestinal: no N/no V/no D/no C/no acid reflux Musculoskeletal: no muscle aches/no joint aches Skin: no rashes, no hair loss Neurological: no tremors/no numbness/no tingling/no dizziness  I reviewed pt's medications, allergies, PMH, social hx, family hx, and changes were documented in the history of present illness. Otherwise, unchanged from my initial visit note.   Past Medical History:  Diagnosis Date  . Depression   . Diabetes mellitus   . GERD (gastroesophageal reflux disease)   . Hyperlipidemia   . Hypertension   .  Obesity    Past Surgical History:  Procedure Laterality Date  . ABDOMINAL HYSTERECTOMY  1991   BSO   Social History   Socioeconomic History  . Marital status: Married    Spouse name: Not on file  . Number of children: 0  . Years of education: 21  . Highest education level: Not on file  Occupational History  . Occupation: retired from [employer]: RETIRED  Tobacco Use  . Smoking status: Former Smoker    Quit date: 1995    Years since quitting: 27.1  . Smokeless tobacco: Never Used  Substance and Sexual Activity  . Alcohol use: No  . Drug use: No  . Sexual activity: Yes    Partners: Male  Other Topics Concern  . Not on file  Social History Narrative   No reg exercise   Social Determinants of Health   Financial Resource Strain: Not on file  Food Insecurity: Not on file  Transportation Needs: Not on file  Physical Activity: Not on file  Stress: Not on file  Social Connections: Not on file  Intimate Partner Violence: Not on file   Current Outpatient Medications on File Prior to Visit  Medication Sig Dispense Refill  . acetaminophen (TYLENOL) 650 MG CR tablet Take 650 mg by mouth 2 (two) times daily as needed. For pain    . aspirin 81 MG tablet Take 81 mg by mouth daily.    . benazepril (LOTENSIN) 20 MG tablet TAKE 1 TABLET DAILY 90 tablet 0  . Blood Glucose Monitoring Suppl (ONE TOUCH ULTRA SYSTEM KIT) W/DEVICE KIT 1 kit by Does not apply route once. 1 each 0  . Continuous Blood Gluc Receiver (FREESTYLE LIBRE 14 DAY READER) DEVI 1 Device by Does not apply route See admin instructions. Use for continuous monitor glucose 1 each 0  . Continuous Blood Gluc Sensor (FREESTYLE LIBRE 14 DAY SENSOR) MISC Apply new sensor every 14 days for continuous glucose monitoring. 6 each 2  . estradiol (ESTRACE) 1 MG tablet Take 1 tablet (1 mg total) by mouth daily. 90 tablet 1  . ezetimibe-simvastatin (VYTORIN) 10-40 MG tablet TAKE 1 TABLET AT BEDTIME 90 tablet 0  .  fenofibrate 160 MG tablet 1 po qd 90 tablet 3  . FLUoxetine (PROZAC) 20 MG tablet Take 3 tablets (60 mg total) by mouth daily. 270 tablet 1  . glucose blood (ONETOUCH ULTRA) test strip Use to check blood sugar 3 times a day 100 each 0  . insulin aspart (NOVOLOG FLEXPEN) 100 UNIT/ML FlexPen Inject 20-30 Units into the skin 3 (three) times daily with meals. 81 mL 3  . Insulin Glargine (BASAGLAR KWIKPEN) 100 UNIT/ML Inject 70 Units into the skin at bedtime. 70 mL 0  . Insulin Pen Needle (PEN NEEDLES) 32G X 4 MM MISC USE AS INSTRUCTED WITH LANCETS. 400 each 3  . metFORMIN (GLUCOPHAGE) 850 MG tablet Take 1 tablet in the AM, and  2 tablets in the PM. 270 tablet 1  . OZEMPIC, 1 MG/DOSE, 4 MG/3ML SOPN INJECT 1MG SUBCUTANEOUSLY  ONCE A WEEK. 9 mL 0  . pantoprazole (PROTONIX) 40 MG tablet Take 1 tablet (40 mg total) by mouth daily. 90 tablet 3  . Semaglutide, 1 MG/DOSE, (OZEMPIC, 1 MG/DOSE,) 2 MG/1.5ML SOPN Inject 1 mg into the skin once a week. 6 pen 2   Current Facility-Administered Medications on File Prior to Visit  Medication Dose Route Frequency Provider Last Rate Last Admin  . ipratropium-albuterol (DUONEB) 0.5-2.5 (3) MG/3ML nebulizer solution 3 mL  3 mL Nebulization Q6H Copland, Jessica C, MD   3 mL at 11/15/16 1330   Allergies  Allergen Reactions  . Hydrocodone-Acetaminophen Other (See Comments)    unknown  . Oxycodone Hcl Other (See Comments)    unknown  . Penicillins Other (See Comments)    unknown  . Prednisone    Family History  Problem Relation Age of Onset  . Macular degeneration Mother   . Heart disease Mother        syncope  . Aortic stenosis Mother   . Lung disease Father 24       mesothelioma  . Cancer Maternal Aunt        stomach  . Heart disease Paternal Uncle        cabg  . Diabetes Paternal Uncle   . Heart disease Paternal Uncle   . Diabetes Paternal Uncle   . Diabetes Paternal Uncle   . Heart disease Paternal Uncle   . Heart disease Paternal Uncle   . Heart  disease Paternal Uncle   . Lung cancer Other        asbestos  . Diabetes Paternal Grandmother   . Cancer Other        lung   PE: BP 130/82   Pulse 90   Ht _0  (1.6 m)   Wt 278 lb 6.4 oz (126.3 kg)   SpO2 98%   BMI 49.32 kg/m  Wt Readings from Last 3 Encounters:  12/18/20 278 lb 6.4 oz (126.3 kg)  12/11/20 270 lb (122.5 kg)  10/30/20 271 lb (122.9 kg)   Constitutional: overweight, in NAD Eyes: PERRLA, EOMI, no exophthalmos ENT: moist mucous membranes, no thyromegaly, no cervical lymphadenopathy Cardiovascular: RRR, No MRG, + B LE edema Respiratory: CTA B Gastrointestinal: abdomen soft, NT, ND, BS+ Musculoskeletal: no deformities, strength intact in all 4 Skin: moist, warm, no rashes except stasis dermatitis of bilateral lower legs Neurological: no tremor with outstretched hands, DTR normal in all 4  ASSESSMENT: 1. DM2, insulin-dependent, uncontrolled, with complications - DR  2. Obesity class 3  3. HL  PLAN:  1. Patient with history of uncontrolled type 2 diabetes, insulin-dependent, with worse control last year when she returned after an absence of 1.5 years.  At that time, she had a lot of stress in her life and she also relaxed her diet.  Sugars were in the 200s to 400s.  We discussed about improving diet and we also increased her insulin the GLP-1 receptor agonist dose.  Trulicity was not covered by her insurance and was switched to Ozempic high-dose.  We also switched from Lantus to [employer].  Sugars started to improve at last visit, 82% were in the normal range.  After dinner she had mildly higher blood sugar so I advised her to take a higher dose of NovoLog especially with the holidays coming up.  Otherwise, we did not change her regimen.  HbA1c was  5.9% CGM interpretation: -At today's visit, we reviewed her CGM downloads: It appears that 80% of values are in target range (goal >70%), while 11% are higher than 180 (goal <25%), and 9% are lower than 70 (goal <4%).  The  calculated average blood sugar is 125.  The projected HbA1c for the next 3 months (GMI) is 6.3%. -Reviewing the CGM trends, her sugars are dropping overnight and they are occasionally under 70 mg/dL.  Also, during the day, especially before and sometimes after dinner, her blood sugars may drop and she occasionally saw blood sugars in the 50s.  She needs to take glucose tablets for this.  After meals, blood sugars could be higher, and I suspect that this is due to the fact that she avoids taking too much rapid acting insulin as her sugars are too low before meals.  Therefore, for now, I advised her to reduce the Basaglar dose by 10 units.  I advised her to keep the same dose of NovoLog for now but let me know if the sugars continue to drop, in which case, we may need to back off both Basaglar and NovoLog.  For now we will continue the same dose of Ozempic and Metformin. - I advised her to:  Patient Instructions  Please continue: - Metformin 850 mg in am and 1700 mg at night - Ozempic 1 mg weekly - NovoLog 3x a day before meals 15 units before a smaller meal 20 units before a larger meal (may need 22-25 units before a large meal)  Please decrease: - Basaglar 60 units at bedtime  Please let me know if the sugars continue to drop after decreasing Basaglar.  Please return in 4 months.  - we checked her HbA1c: 6.0% (only slightly higher) - advised to check sugars at different times of the day - 4x a day, rotating check times - advised for yearly eye exams >> she is UTD - return to clinic in 4 months  2. Obesity class 3  -We will continue the GLP-1 receptor agonist which should also help with weight loss -She gained 7 pounds before last visit -At last visit, she weighed 273 lbs >> now: 278 - gained 5 lbs  3. HL -Reviewed latest lipid panel from 03/2020: All fractions at goal Lab Results  Component Value Date   CHOL 113 03/11/2020   HDL 49.50 03/11/2020   LDLCALC 39 03/11/2020   LDLDIRECT  64.0 04/21/2017   TRIG 123.0 03/11/2020   CHOLHDL 2 03/11/2020  -She continues simvastatin-ezetimibe 40-10 and also fenofibrate 160 mg daily without side effects  Philemon Kingdom, MD PhD [facility]

## 2020-12-25 ENCOUNTER — Telehealth: Payer: Self-pay

## 2020-12-25 NOTE — Telephone Encounter (Signed)
Pt's insurance company has denied Insulin Pen Needles

## 2020-12-27 ENCOUNTER — Encounter: Payer: Self-pay | Admitting: Family Medicine

## 2020-12-27 DIAGNOSIS — E785 Hyperlipidemia, unspecified: Secondary | ICD-10-CM

## 2020-12-27 DIAGNOSIS — K219 Gastro-esophageal reflux disease without esophagitis: Secondary | ICD-10-CM

## 2020-12-27 DIAGNOSIS — I10 Essential (primary) hypertension: Secondary | ICD-10-CM

## 2020-12-27 DIAGNOSIS — Z780 Asymptomatic menopausal state: Secondary | ICD-10-CM

## 2020-12-29 MED ORDER — FENOFIBRATE 160 MG PO TABS: ORAL_TABLET | ORAL | 0 refills | Status: DC

## 2020-12-29 MED ORDER — PANTOPRAZOLE SODIUM 40 MG PO TBEC: 40.0000 mg | DELAYED_RELEASE_TABLET | Freq: Every day | ORAL | 0 refills | Status: DC

## 2020-12-29 MED ORDER — FLUOXETINE HCL 20 MG PO TABS: 60.0000 mg | ORAL_TABLET | Freq: Every day | ORAL | 0 refills | Status: DC

## 2020-12-29 MED ORDER — BENAZEPRIL HCL 20 MG PO TABS: 20.0000 mg | ORAL_TABLET | Freq: Every day | ORAL | 0 refills | Status: DC

## 2020-12-29 MED ORDER — EZETIMIBE-SIMVASTATIN 10-40 MG PO TABS: 1.0000 | ORAL_TABLET | Freq: Every day | ORAL | 0 refills | Status: DC

## 2020-12-29 MED ORDER — ESTRADIOL 1 MG PO TABS: 1.0000 mg | ORAL_TABLET | Freq: Every day | ORAL | 0 refills | Status: DC

## 2021-01-01 ENCOUNTER — Encounter: Payer: Self-pay | Admitting: Family Medicine

## 2021-01-01 ENCOUNTER — Ambulatory Visit (INDEPENDENT_AMBULATORY_CARE_PROVIDER_SITE_OTHER): Payer: Medicare Other | Admitting: Family Medicine

## 2021-01-01 ENCOUNTER — Other Ambulatory Visit: Payer: Self-pay

## 2021-01-01 VITALS — BP 144/74 | HR 95 | Temp 98.9°F | Resp 20 | Ht 63.0 in | Wt 273.2 lb

## 2021-01-01 DIAGNOSIS — Z780 Asymptomatic menopausal state: Secondary | ICD-10-CM | POA: Diagnosis not present

## 2021-01-01 DIAGNOSIS — F418 Other specified anxiety disorders: Secondary | ICD-10-CM | POA: Diagnosis not present

## 2021-01-01 DIAGNOSIS — Z794 Long term (current) use of insulin: Secondary | ICD-10-CM

## 2021-01-01 DIAGNOSIS — I10 Essential (primary) hypertension: Secondary | ICD-10-CM | POA: Diagnosis not present

## 2021-01-01 DIAGNOSIS — E785 Hyperlipidemia, unspecified: Secondary | ICD-10-CM

## 2021-01-01 DIAGNOSIS — E1165 Type 2 diabetes mellitus with hyperglycemia: Secondary | ICD-10-CM

## 2021-01-01 LAB — LIPID PANEL
Cholesterol: 98 mg/dL (ref 0–200)
HDL: 48.7 mg/dL (ref >39.00)
LDL Cholesterol: 20 mg/dL (ref 0–99)
NonHDL: 49.75
Total CHOL/HDL Ratio: 2
Triglycerides: 147 mg/dL (ref 0.0–149.0)
VLDL: 29.4 mg/dL (ref 0.0–40.0)

## 2021-01-01 LAB — COMPREHENSIVE METABOLIC PANEL
ALT: 12 U/L (ref 0–35)
AST: 13 U/L (ref 0–37)
Albumin: 3.7 g/dL (ref 3.5–5.2)
Alkaline Phosphatase: 26 U/L — ABNORMAL LOW (ref 39–117)
BUN: 24 mg/dL — ABNORMAL HIGH (ref 6–23)
CO2: 31 mEq/L (ref 19–32)
Calcium: 9.5 mg/dL (ref 8.4–10.5)
Chloride: 104 mEq/L (ref 96–112)
Creatinine, Ser: 0.86 mg/dL (ref 0.40–1.20)
GFR: 68.71 mL/min (ref >60.00)
Glucose, Bld: 66 mg/dL — ABNORMAL LOW (ref 70–99)
Potassium: 4.3 mEq/L (ref 3.5–5.1)
Sodium: 141 mEq/L (ref 135–145)
Total Bilirubin: 0.4 mg/dL (ref 0.2–1.2)
Total Protein: 6 g/dL (ref 6.0–8.3)

## 2021-01-01 MED ORDER — EZETIMIBE-SIMVASTATIN 10-40 MG PO TABS
1.0000 | ORAL_TABLET | Freq: Every day | ORAL | 1 refills | Status: DC
Start: 2021-01-01 — End: 2021-02-24

## 2021-01-01 MED ORDER — FLUOXETINE HCL 20 MG PO TABS
60.0000 mg | ORAL_TABLET | Freq: Every day | ORAL | 3 refills | Status: DC
Start: 2021-01-01 — End: 2021-03-19

## 2021-01-01 MED ORDER — FENOFIBRATE 160 MG PO TABS
ORAL_TABLET | ORAL | 1 refills | Status: DC
Start: 2021-01-01 — End: 2021-02-24

## 2021-01-01 MED ORDER — BENAZEPRIL HCL 20 MG PO TABS: 20.0000 mg | ORAL_TABLET | Freq: Every day | ORAL | 1 refills | Status: DC

## 2021-01-01 MED ORDER — ESTRADIOL 1 MG PO TABS: 1.0000 mg | ORAL_TABLET | Freq: Every day | ORAL | 1 refills | Status: DC

## 2021-01-01 NOTE — Patient Instructions (Signed)

## 2021-01-01 NOTE — Progress Notes (Signed)
Patient ID: Andrea Marks, female    DOB: 15-Feb-1951  Age: 70 y.o. MRN: [id number]    Subjective:  Subjective  HPI Andrea Marks presents for f/u bp and cholesterol  Pt sees endo for her dm  No new complaints   Review of Systems  Constitutional: Negative for appetite change, diaphoresis, fatigue and unexpected weight change.  Eyes: Negative for pain, redness and visual disturbance.  Respiratory: Negative for cough, chest tightness, shortness of breath and wheezing.   Cardiovascular: Negative for chest pain, palpitations and leg swelling.  Endocrine: Negative for cold intolerance, heat intolerance, polydipsia, polyphagia and polyuria.  Genitourinary: Negative for difficulty urinating, dysuria and frequency.  Neurological: Negative for dizziness, light-headedness, numbness and headaches.    History Past Medical History:  Diagnosis Date  . Depression   . Diabetes mellitus   . GERD (gastroesophageal reflux disease)   . Hyperlipidemia   . Hypertension   . Obesity     She has a past surgical history that includes Abdominal hysterectomy (1991).   Her family history includes Aortic stenosis in her mother; Cancer in her maternal aunt and another family member; Diabetes in her paternal grandmother, paternal uncle, paternal uncle, and paternal uncle; Heart disease in her mother, paternal uncle, paternal uncle, paternal uncle, paternal uncle, and paternal uncle; Lung cancer in an other family member; Lung disease (age of onset: 47) in her father; Macular degeneration in her mother.She reports that she quit smoking about 27 years ago. She has never used smokeless tobacco. She reports that she does not drink alcohol and does not use drugs.  Current Outpatient Medications on File Prior to Visit  Medication Sig Dispense Refill  . acetaminophen (TYLENOL) 650 MG CR tablet Take 650 mg by mouth 2 (two) times daily as needed. For pain    . aspirin 81 MG tablet Take 81 mg by mouth daily.     . Blood Glucose Monitoring Suppl (ONE TOUCH ULTRA SYSTEM KIT) W/DEVICE KIT 1 kit by Does not apply route once. 1 each 0  . Continuous Blood Gluc Receiver (FREESTYLE LIBRE 14 DAY READER) DEVI 1 Device by Does not apply route See admin instructions. Use for continuous monitor glucose 1 each 0  . Continuous Blood Gluc Sensor (FREESTYLE LIBRE 14 DAY SENSOR) MISC Apply new sensor every 14 days for continuous glucose monitoring. 6 each 2  . glucose blood (ONETOUCH ULTRA) test strip Use to check blood sugar 3 times a day 100 each 0  . insulin aspart (NOVOLOG FLEXPEN) 100 UNIT/ML FlexPen Inject 18-24 Units into the skin 3 (three) times daily with meals. 45 mL 3  . Insulin Glargine (BASAGLAR KWIKPEN) 100 UNIT/ML Inject 60 Units into the skin at bedtime. 45 mL 3  . Insulin Pen Needle (PEN NEEDLES) 32G X 4 MM MISC USE AS INSTRUCTED WITH LANCETS. 400 each 3  . metFORMIN (GLUCOPHAGE) 850 MG tablet Take 1 tablet in the AM, and 2 tablets in the PM. 270 tablet 3  . pantoprazole (PROTONIX) 40 MG tablet Take 1 tablet (40 mg total) by mouth daily. Pt due for office visit 30 tablet 0  . Semaglutide, 1 MG/DOSE, (OZEMPIC, 1 MG/DOSE,) 4 MG/3ML SOPN INJECT 1MG SUBCUTANEOUSLY  ONCE A WEEK. 9 mL 3   Current Facility-Administered Medications on File Prior to Visit  Medication Dose Route Frequency Provider Last Rate Last Admin  . ipratropium-albuterol (DUONEB) 0.5-2.5 (3) MG/3ML nebulizer solution 3 mL  3 mL Nebulization Q6H Copland, Jessica C, MD   3 mL at 11/15/16 1330  Objective:  Objective  Physical Exam Vitals and nursing note reviewed.  Constitutional:      Appearance: She is well-developed and well-nourished.  HENT:     Head: Normocephalic and atraumatic.  Eyes:     Extraocular Movements: EOM normal.     Conjunctiva/sclera: Conjunctivae normal.  Neck:     Thyroid: No thyromegaly.     Vascular: No carotid bruit or JVD.  Cardiovascular:     Rate and Rhythm: Normal rate and regular rhythm.     Heart  sounds: Normal heart sounds. No murmur heard.   Pulmonary:     Effort: Pulmonary effort is normal. No respiratory distress.     Breath sounds: Normal breath sounds. No wheezing or rales.  Chest:     Chest wall: No tenderness.  Musculoskeletal:        General: No edema.     Cervical back: Normal range of motion and neck supple.  Neurological:     Mental Status: She is alert and oriented to person, place, and time.  Psychiatric:        Mood and Affect: Mood and affect normal.    BP (!) 144/74 (BP Location: Left Arm, Patient Position: Sitting, Cuff Size: Large)   Pulse 95   Temp 98.9 F (37.2 C) (Oral)   Resp 20   Ht _0  (1.6 m)   Wt 273 lb 3.2 oz (123.9 kg)   SpO2 97%   BMI 48.40 kg/m  Wt Readings from Last 3 Encounters:  01/01/21 273 lb 3.2 oz (123.9 kg)  12/18/20 278 lb 6.4 oz (126.3 kg)  12/11/20 270 lb (122.5 kg)     Lab Results  Component Value Date   WBC 9.8 12/15/2018   HGB 15.0 12/15/2018   HCT 46.3 (H) 12/15/2018   PLT 227.0 12/15/2018   GLUCOSE 174 (H) 03/11/2020   CHOL 113 03/11/2020   TRIG 123.0 03/11/2020   HDL 49.50 03/11/2020   LDLDIRECT 64.0 04/21/2017   LDLCALC 39 03/11/2020   ALT 25 03/11/2020   AST 30 03/11/2020   NA 135 03/11/2020   K 4.9 03/11/2020   CL 100 03/11/2020   CREATININE 0.98 03/11/2020   BUN 22 03/11/2020   CO2 29 03/11/2020   TSH 3.22 08/07/2015   INR 1.2 RATIO 04/19/2007   HGBA1C 6.0 (A) 12/18/2020   MICROALBUR 28.9 (H) 09/10/2019    DG Shoulder Left  Result Date: 05/29/2018 CLINICAL DATA:  Left shoulder pain after fall last month. EXAM: LEFT SHOULDER - 2+ VIEW COMPARISON:  None. FINDINGS: There is no evidence of fracture or dislocation. There is no evidence of arthropathy or other focal bone abnormality. Soft tissues are unremarkable. IMPRESSION: Normal left shoulder. Electronically Signed   By: Marijo Conception, M.D.   On: 05/29/2018 16:20     Assessment & Plan:  Plan  I have changed Andrea Marks's benazepril,  estradiol, fenofibrate, and FLUoxetine. I am also having her maintain her acetaminophen, aspirin, ONE TOUCH ULTRA SYSTEM KIT, OneTouch Ultra, FreeStyle Libre 14 Day Reader, FreeStyle Libre 14 Day Sensor, Pen Needles, Ozempic (1 MG/DOSE), NovoLOG FlexPen, metFORMIN, Basaglar KwikPen, pantoprazole, and ezetimibe-simvastatin. We will continue to administer ipratropium-albuterol.  Meds ordered this encounter  Medications  . benazepril (LOTENSIN) 20 MG tablet    Sig: Take 1 tablet (20 mg total) by mouth daily.    Dispense:  90 tablet    Refill:  1  . estradiol (ESTRACE) 1 MG tablet    Sig: Take 1 tablet (1 mg total)  by mouth daily.    Dispense:  90 tablet    Refill:  1  . ezetimibe-simvastatin (VYTORIN) 10-40 MG tablet    Sig: Take 1 tablet by mouth at bedtime. Pt due for office visit    Dispense:  90 tablet    Refill:  1  . fenofibrate 160 MG tablet    Sig: 1 po qd    Dispense:  90 tablet    Refill:  1  . FLUoxetine (PROZAC) 20 MG tablet    Sig: Take 3 tablets (60 mg total) by mouth daily.    Dispense:  90 tablet    Refill:  3    Problem List Items Addressed This Visit      Unprioritized   Essential hypertension    Well controlled, no changes to meds. Encouraged heart healthy diet such as the DASH diet and exercise as tolerated.       Relevant Medications   benazepril (LOTENSIN) 20 MG tablet   ezetimibe-simvastatin (VYTORIN) 10-40 MG tablet   fenofibrate 160 MG tablet   Other Relevant Orders   Lipid panel   Comprehensive metabolic panel   Hyperlipidemia LDL goal <70    Tolerating statin, encouraged heart healthy diet, avoid trans fats, minimize simple carbs and saturated fats. Increase exercise as tolerated      Relevant Medications   benazepril (LOTENSIN) 20 MG tablet   ezetimibe-simvastatin (VYTORIN) 10-40 MG tablet   fenofibrate 160 MG tablet   Other Relevant Orders   Lipid panel   Comprehensive metabolic panel   Type 2 diabetes mellitus with hyperglycemia, with  long-term current use of insulin (HCC)    Per endo       Relevant Medications   benazepril (LOTENSIN) 20 MG tablet    Other Visit Diagnoses    Depression with anxiety    -  Primary   Relevant Medications   FLUoxetine (PROZAC) 20 MG tablet   Menopause       Relevant Medications   estradiol (ESTRACE) 1 MG tablet      Follow-up: Return in about 6 months (around 07/04/2021), or if symptoms worsen or fail to improve, for hypertension, hyperlipidemia, annual exam, fasting.  [name], DO

## 2021-01-01 NOTE — Assessment & Plan Note (Signed)
Per endo °

## 2021-01-01 NOTE — Assessment & Plan Note (Signed)
Tolerating statin, encouraged heart healthy diet, avoid trans fats, minimize simple carbs and saturated fats. Increase exercise as tolerated 

## 2021-01-01 NOTE — Assessment & Plan Note (Signed)
Well controlled, no changes to meds. Encouraged heart healthy diet such as the DASH diet and exercise as tolerated.  °

## 2021-01-21 NOTE — Progress Notes (Signed)
[place] [address] [place] Phone: [phone] Subjective:   I Andrea Marks am serving as a Education administrator for Dr. Hulan Saas.  This visit occurred during the SARS-CoV-2 public health emergency.  Safety protocols were in place, including screening questions prior to the visit, additional usage of staff PPE, and extensive cleaning of exam room while observing appropriate contact time as indicated for disinfecting solutions.   I'm seeing this patient by the request  of:  [name], DO  CC: bilateral knee pain   PPI:[id number]   12/11/2020 Patient does have arthritic changes right greater than left.  Still seems to be at moderate category.  Hopefully patient will respond well to the viscosupplementation.  We did discuss that can take about a month to work.  Patient will continue conservative therapy and see me again in 2 months  Update 01/22/2021 Andrea Marks is a 70 y.o. female coming in with complaint of B, knee pain. Durolane given last visit. Patient states she is doing well but the right knee is bothering her. Last week she tripped and stubbed her toe. Left leg is "killing her" and radiating distally.  Patient states that it is more of the leg pain.  Seems to be developing anterior aspect.  Denies any calf pain.  Has not noticed any swelling.  Did not fall on the knee specifically.     Past Medical History:  Diagnosis Date  . Depression   . Diabetes mellitus   . GERD (gastroesophageal reflux disease)   . Hyperlipidemia   . Hypertension   . Obesity    Past Surgical History:  Procedure Laterality Date  . ABDOMINAL HYSTERECTOMY  1991   BSO   Social History   Socioeconomic History  . Marital status: Married    Spouse name: Not on file  . Number of children: 0  . Years of education: 57  . Highest education level: Not on file  Occupational History  . Occupation: retired from [employer]: RETIRED   Tobacco Use  . Smoking status: Former Smoker    Quit date: 1995    Years since quitting: 27.2  . Smokeless tobacco: Never Used  Substance and Sexual Activity  . Alcohol use: No  . Drug use: No  . Sexual activity: Yes    Partners: Male  Other Topics Concern  . Not on file  Social History Narrative   No reg exercise   Social Determinants of Health   Financial Resource Strain: Not on file  Food Insecurity: Not on file  Transportation Needs: Not on file  Physical Activity: Not on file  Stress: Not on file  Social Connections: Not on file   Allergies  Allergen Reactions  . Hydrocodone-Acetaminophen Other (See Comments)    unknown  . Oxycodone Hcl Other (See Comments)    unknown  . Penicillins Other (See Comments)    unknown  . Prednisone    Family History  Problem Relation Age of Onset  . Macular degeneration Mother   . Heart disease Mother        syncope  . Aortic stenosis Mother   . Lung disease Father 73       mesothelioma  . Cancer Maternal Aunt        stomach  . Heart disease Paternal Uncle        cabg  . Diabetes Paternal Uncle   . Heart disease Paternal Uncle   . Diabetes  Paternal Uncle   . Diabetes Paternal Uncle   . Heart disease Paternal Uncle   . Heart disease Paternal Uncle   . Heart disease Paternal Uncle   . Lung cancer Other        asbestos  . Diabetes Paternal Grandmother   . Cancer Other        lung    Current Outpatient Medications (Endocrine & Metabolic):  .  estradiol (ESTRACE) 1 MG tablet, Take 1 tablet (1 mg total) by mouth daily. .  insulin aspart (NOVOLOG FLEXPEN) 100 UNIT/ML FlexPen, Inject 18-24 Units into the skin 3 (three) times daily with meals. .  Insulin Glargine (BASAGLAR KWIKPEN) 100 UNIT/ML, Inject 60 Units into the skin at bedtime. .  metFORMIN (GLUCOPHAGE) 850 MG tablet, Take 1 tablet in the AM, and 2 tablets in the PM. .  Semaglutide, 1 MG/DOSE, (OZEMPIC, 1 MG/DOSE,) 4 MG/3ML SOPN, INJECT 1MG SUBCUTANEOUSLY  ONCE A  WEEK.   Current Outpatient Medications (Cardiovascular):  .  benazepril (LOTENSIN) 20 MG tablet, Take 1 tablet (20 mg total) by mouth daily. Marland Kitchen  ezetimibe-simvastatin (VYTORIN) 10-40 MG tablet, Take 1 tablet by mouth at bedtime. Pt due for office visit .  fenofibrate 160 MG tablet, 1 po qd    Current Facility-Administered Medications (Respiratory):  .  ipratropium-albuterol (DUONEB) 0.5-2.5 (3) MG/3ML nebulizer solution 3 mL  Current Outpatient Medications (Analgesics):  .  acetaminophen (TYLENOL) 650 MG CR tablet, Take 650 mg by mouth 2 (two) times daily as needed. For pain .  aspirin 81 MG tablet, Take 81 mg by mouth daily.     Current Outpatient Medications (Other):  .  Blood Glucose Monitoring Suppl (ONE TOUCH ULTRA SYSTEM KIT) W/DEVICE KIT, 1 kit by Does not apply route once. .  Continuous Blood Gluc Receiver (FREESTYLE LIBRE 14 DAY READER) DEVI, 1 Device by Does not apply route See admin instructions. Use for continuous monitor glucose .  Continuous Blood Gluc Sensor (FREESTYLE LIBRE 14 DAY SENSOR) MISC, Apply new sensor every 14 days for continuous glucose monitoring. Marland Kitchen  FLUoxetine (PROZAC) 20 MG tablet, Take 3 tablets (60 mg total) by mouth daily. Marland Kitchen  glucose blood (ONETOUCH ULTRA) test strip, Use to check blood sugar 3 times a day .  Insulin Pen Needle (PEN NEEDLES) 32G X 4 MM MISC, USE AS INSTRUCTED WITH LANCETS. .  pantoprazole (PROTONIX) 40 MG tablet, Take 1 tablet (40 mg total) by mouth daily. Pt due for office visit    Reviewed prior external information including notes and imaging from  primary care provider As well as notes that were available from care everywhere and other healthcare systems.  Past medical history, social, surgical and family history all reviewed in electronic medical record.  No pertanent information unless stated regarding to the chief complaint.   Review of Systems:  No headache, visual changes, nausea, vomiting, diarrhea, constipation,  dizziness, abdominal pain, skin rash, fevers, chills, night sweats, weight loss, swollen lymph nodes, body aches, chest pain, shortness of breath, mood changes. POSITIVE muscle aches, joint swelling  Objective  Blood pressure (!) 158/84, pulse 95, height _0  (1.6 m), weight 277 lb (125.6 kg), SpO2 98 %.   General: No apparent distress alert and oriented x3 mood and affect normal, dressed appropriately.  Morbidly obese HEENT: Pupils equal, extraocular movements intact  Respiratory: Patient's speak in full sentences and does not appear short of breath  Cardiovascular: 1+ lower extremity edema which is symmetric to contralateral side.  Patient has some mild hemosiderin deposits  of the distal ankle., non tender, no erythema  Gait severely antalgic favoring the left leg   Knee exams bilaterally show that patient does have significant varicose veins.  Patient does have tenderness to palpation over the medial joint line.  Instability noted with valgus and varus force.  Patient does have a large effusion on the left knee from patient's previous exams.  After informed written and verbal consent, patient was seated on exam table. Right knee was prepped with alcohol swab and utilizing anterolateral approach, patient's right knee space was injected with 4:1  marcaine 0.5%: Kenalog 49m/dL. Patient tolerated the procedure well without immediate complications.  After informed written and verbal consent, patient was seated on exam table. Left knee was prepped with alcohol swab and utilizing anterolateral approach, patient's left knee space was injected with 4:1  marcaine 0.5%: Kenalog 427mdL. Patient tolerated the procedure well without immediate complications. Impression and Recommendations:     The above documentation has been reviewed and is accurate and complete ZaLyndal PulleyDO

## 2021-01-22 ENCOUNTER — Encounter: Payer: Self-pay | Admitting: Family Medicine

## 2021-01-22 ENCOUNTER — Other Ambulatory Visit: Payer: Self-pay

## 2021-01-22 ENCOUNTER — Ambulatory Visit (INDEPENDENT_AMBULATORY_CARE_PROVIDER_SITE_OTHER): Payer: Medicare Other | Admitting: Family Medicine

## 2021-01-22 DIAGNOSIS — M170 Bilateral primary osteoarthritis of knee: Secondary | ICD-10-CM

## 2021-01-22 NOTE — Patient Instructions (Signed)
Good to see you  Ice 20 minutes 2 times daily. Usually after activity and before bed. Try this brace but then we will get you a custom brace and see if it helps Unfortunately if this continues to not improve we may need ot look into surgery  See me again in 6 weeks

## 2021-01-22 NOTE — Assessment & Plan Note (Addendum)
Repeat injection given today.  Chronic problem with exacerbation.  Patient does have abnormal thigh to calf ratio and significant instability of the knee with valgus and varus force that would allow patient to be a candidate for a custom brace.  Recently did have probably to the left knee.  Discussed icing regimen and home exercises.  We will try to get patient a custom OA brace with patient having an abnormal thigh to calf ratio with the instability of the knee with varus and valgus strain/testing.  Given a hinged brace today for patient to trial.  Follow-up with me again in 6 weeks.  Discussed with patient verbally that if any other significant worsening pain that patient does need to seek attention this weekend.

## 2021-02-11 ENCOUNTER — Other Ambulatory Visit: Payer: Self-pay

## 2021-02-11 ENCOUNTER — Ambulatory Visit (INDEPENDENT_AMBULATORY_CARE_PROVIDER_SITE_OTHER): Payer: Medicare Other | Admitting: Podiatry

## 2021-02-11 ENCOUNTER — Encounter: Payer: Self-pay | Admitting: Podiatry

## 2021-02-11 DIAGNOSIS — M79676 Pain in unspecified toe(s): Secondary | ICD-10-CM

## 2021-02-11 DIAGNOSIS — B351 Tinea unguium: Secondary | ICD-10-CM

## 2021-02-11 DIAGNOSIS — Z794 Long term (current) use of insulin: Secondary | ICD-10-CM

## 2021-02-11 DIAGNOSIS — E1151 Type 2 diabetes mellitus with diabetic peripheral angiopathy without gangrene: Secondary | ICD-10-CM

## 2021-02-11 DIAGNOSIS — L84 Corns and callosities: Secondary | ICD-10-CM

## 2021-02-11 NOTE — Progress Notes (Signed)
This patient returns to my office for at risk foot care.  This patient requires this care by a professional since this patient will be at risk due to having diabetes and history of thrombophlebitis.  This patient is unable to cut nails herself since the patient cannot reach her nails.These nails are painful walking and wearing shoes.  This patient presents for at risk foot care today.  General Appearance  Alert, conversant and in no acute stress.  Vascular  Dorsalis pedis and posterior tibial  pulses are not  palpable  Bilaterally  Due to swelling..  Capillary return is within normal limits  bilaterally. Temperature is within normal limits  bilaterally.  Neurologic  Senn-Weinstein monofilament wire test diminished  bilaterally. Muscle power within normal limits bilaterally.  Nails Thick disfigured discolored nails with subungual debris  from hallux to fifth toes bilaterally. No evidence of bacterial infection or drainage bilaterally.  Orthopedic  No limitations of motion  feet .  No crepitus or effusions noted.  No bony pathology or digital deformities noted.  Skin  normotropic skin with no porokeratosis noted bilaterally.  No signs of infections or ulcers noted.   Pinch callus  B/L.   No infection or drainage noted.  Onychomycosis  Pain in right toes  Pain in left toes  Skin trauma 3rd toenail left foot.  Consent was obtained for treatment procedures.   Mechanical debridement of nails 1-5  bilaterally performed with a nail nipper.  Filed with dremel without incident.    Return office visit   9 weeks                  Told patient to return for periodic foot care and evaluation due to potential at risk complications.   Helane Gunther DPM

## 2021-02-16 ENCOUNTER — Encounter: Payer: Self-pay | Admitting: Family Medicine

## 2021-02-24 ENCOUNTER — Other Ambulatory Visit: Payer: Self-pay | Admitting: Family Medicine

## 2021-02-24 DIAGNOSIS — I10 Essential (primary) hypertension: Secondary | ICD-10-CM

## 2021-02-24 DIAGNOSIS — E785 Hyperlipidemia, unspecified: Secondary | ICD-10-CM

## 2021-02-26 ENCOUNTER — Other Ambulatory Visit: Payer: Self-pay

## 2021-02-26 ENCOUNTER — Ambulatory Visit (INDEPENDENT_AMBULATORY_CARE_PROVIDER_SITE_OTHER): Payer: Medicare Other | Admitting: Neurology

## 2021-02-26 ENCOUNTER — Telehealth: Payer: Self-pay | Admitting: Neurology

## 2021-02-26 ENCOUNTER — Encounter: Payer: Self-pay | Admitting: Neurology

## 2021-02-26 ENCOUNTER — Emergency Department (HOSPITAL_COMMUNITY): Payer: Medicare Other

## 2021-02-26 ENCOUNTER — Inpatient Hospital Stay (HOSPITAL_COMMUNITY)
Admission: EM | Admit: 2021-02-26 | Discharge: 2021-03-04 | DRG: 092 | Disposition: A | Discharge status: Home / Self Care | Payer: Medicare Other | Attending: Internal Medicine | Admitting: Internal Medicine

## 2021-02-26 VITALS — BP 175/85 | HR 91 | Ht 63.0 in | Wt 276.6 lb

## 2021-02-26 DIAGNOSIS — H538 Other visual disturbances: Secondary | ICD-10-CM | POA: Diagnosis present

## 2021-02-26 DIAGNOSIS — H5461 Unqualified visual loss, right eye, normal vision left eye: Secondary | ICD-10-CM

## 2021-02-26 DIAGNOSIS — R062 Wheezing: Secondary | ICD-10-CM | POA: Diagnosis not present

## 2021-02-26 DIAGNOSIS — H547 Unspecified visual loss: Secondary | ICD-10-CM

## 2021-02-26 DIAGNOSIS — E1169 Type 2 diabetes mellitus with other specified complication: Secondary | ICD-10-CM | POA: Diagnosis present

## 2021-02-26 DIAGNOSIS — Z888 Allergy status to other drugs, medicaments and biological substances status: Secondary | ICD-10-CM

## 2021-02-26 DIAGNOSIS — E1165 Type 2 diabetes mellitus with hyperglycemia: Secondary | ICD-10-CM | POA: Diagnosis present

## 2021-02-26 DIAGNOSIS — Z8249 Family history of ischemic heart disease and other diseases of the circulatory system: Secondary | ICD-10-CM

## 2021-02-26 DIAGNOSIS — Z6841 Body Mass Index (BMI) 40.0 and over, adult: Secondary | ICD-10-CM

## 2021-02-26 DIAGNOSIS — Z7982 Long term (current) use of aspirin: Secondary | ICD-10-CM

## 2021-02-26 DIAGNOSIS — Z79899 Other long term (current) drug therapy: Secondary | ICD-10-CM

## 2021-02-26 DIAGNOSIS — I10 Essential (primary) hypertension: Secondary | ICD-10-CM | POA: Diagnosis present

## 2021-02-26 DIAGNOSIS — R519 Headache, unspecified: Secondary | ICD-10-CM

## 2021-02-26 DIAGNOSIS — H4710 Unspecified papilledema: Secondary | ICD-10-CM

## 2021-02-26 DIAGNOSIS — Z794 Long term (current) use of insulin: Secondary | ICD-10-CM

## 2021-02-26 DIAGNOSIS — Z880 Allergy status to penicillin: Secondary | ICD-10-CM

## 2021-02-26 DIAGNOSIS — Z885 Allergy status to narcotic agent status: Secondary | ICD-10-CM

## 2021-02-26 DIAGNOSIS — E785 Hyperlipidemia, unspecified: Secondary | ICD-10-CM | POA: Diagnosis present

## 2021-02-26 DIAGNOSIS — F32A Depression, unspecified: Secondary | ICD-10-CM | POA: Diagnosis present

## 2021-02-26 DIAGNOSIS — K219 Gastro-esophageal reflux disease without esophagitis: Secondary | ICD-10-CM | POA: Diagnosis present

## 2021-02-26 DIAGNOSIS — H5711 Ocular pain, right eye: Secondary | ICD-10-CM | POA: Diagnosis present

## 2021-02-26 DIAGNOSIS — Z20822 Contact with and (suspected) exposure to covid-19: Secondary | ICD-10-CM | POA: Diagnosis present

## 2021-02-26 DIAGNOSIS — G932 Benign intracranial hypertension: Secondary | ICD-10-CM

## 2021-02-26 DIAGNOSIS — Z90710 Acquired absence of both cervix and uterus: Secondary | ICD-10-CM

## 2021-02-26 DIAGNOSIS — Z833 Family history of diabetes mellitus: Secondary | ICD-10-CM

## 2021-02-26 DIAGNOSIS — T380X5A Adverse effect of glucocorticoids and synthetic analogues, initial encounter: Secondary | ICD-10-CM | POA: Diagnosis present

## 2021-02-26 DIAGNOSIS — G4489 Other headache syndrome: Secondary | ICD-10-CM

## 2021-02-26 DIAGNOSIS — Z7984 Long term (current) use of oral hypoglycemic drugs: Secondary | ICD-10-CM

## 2021-02-26 DIAGNOSIS — Z79890 Hormone replacement therapy (postmenopausal): Secondary | ICD-10-CM

## 2021-02-26 DIAGNOSIS — Z87891 Personal history of nicotine dependence: Secondary | ICD-10-CM

## 2021-02-26 LAB — CBC WITH DIFFERENTIAL/PLATELET
Abs Immature Granulocytes: 0.11 10*3/uL — ABNORMAL HIGH (ref 0.00–0.07)
Basophils Absolute: 0.1 10*3/uL (ref 0.0–0.1)
Basophils Relative: 1 %
Eosinophils Absolute: 0.1 10*3/uL (ref 0.0–0.5)
Eosinophils Relative: 1 %
HCT: 46.6 % — ABNORMAL HIGH (ref 36.0–46.0)
Hemoglobin: 14.2 g/dL (ref 12.0–15.0)
Immature Granulocytes: 1 %
Lymphocytes Relative: 21 %
Lymphs Abs: 2.2 10*3/uL (ref 0.7–4.0)
MCH: 26.9 pg (ref 26.0–34.0)
MCHC: 30.5 g/dL (ref 30.0–36.0)
MCV: 88.3 fL (ref 80.0–100.0)
Monocytes Absolute: 0.7 10*3/uL (ref 0.1–1.0)
Monocytes Relative: 7 %
Neutro Abs: 7.2 10*3/uL (ref 1.7–7.7)
Neutrophils Relative %: 69 %
Platelets: 272 10*3/uL (ref 150–400)
RBC: 5.28 MIL/uL — ABNORMAL HIGH (ref 3.87–5.11)
RDW: 14.5 % (ref 11.5–15.5)
WBC: 10.4 10*3/uL (ref 4.0–10.5)
nRBC: 0 % (ref 0.0–0.2)

## 2021-02-26 LAB — BASIC METABOLIC PANEL
Anion gap: 10 (ref 5–15)
BUN: 21 mg/dL (ref 8–23)
CO2: 27 mmol/L (ref 22–32)
Calcium: 9 mg/dL (ref 8.9–10.3)
Chloride: 99 mmol/L (ref 98–111)
Creatinine, Ser: 1.08 mg/dL — ABNORMAL HIGH (ref 0.44–1.00)
GFR, Estimated: 55 mL/min — ABNORMAL LOW (ref >60)
Glucose, Bld: 138 mg/dL — ABNORMAL HIGH (ref 70–99)
Potassium: 4.5 mmol/L (ref 3.5–5.1)
Sodium: 136 mmol/L (ref 135–145)

## 2021-02-26 LAB — SEDIMENTATION RATE: Sed Rate: 13 mm/hr (ref 0–22)

## 2021-02-26 LAB — C-REACTIVE PROTEIN: CRP: 0.6 mg/dL (ref <1.0)

## 2021-02-26 IMAGING — MR MR HEAD W/O CM
7 of 11 series · 25 of 48 positions shown · non-contrast
Comparison: None.

CLINICAL DATA: Rapidly progressive optic disc edema

EXAM:
MRI HEAD WITHOUT CONTRAST
TECHNIQUE: Multiplanar, multiecho pulse sequences of the brain and surrounding
structures were obtained without intravenous contrast.

[Series 2: DWI · axial · 3.0mm · 1.02mm/px · z∈[-119,+25]mm · 8 of 105 slices shown (1 of 2)]
[im 1/105]
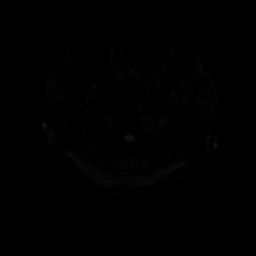
[im 15/105]
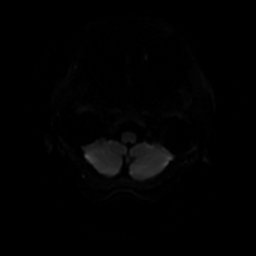
[im 30/105]
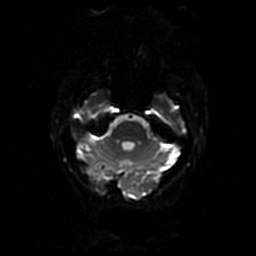
[im 45/105]
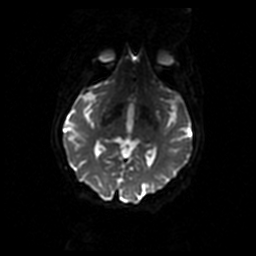
[im 60/105]
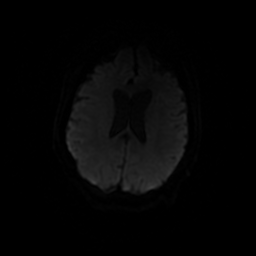
[im 75/105]
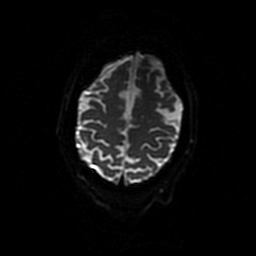
[im 90/105]
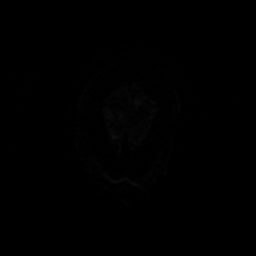
[im 105/105]
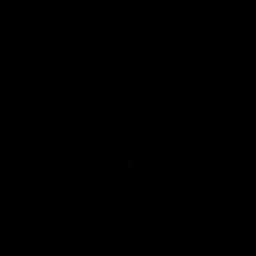

[Series 3: DWI · coronal · 5.0mm · 1.02mm/px · 5 of 72 slices shown (2 of 2)]
[im 1/72]
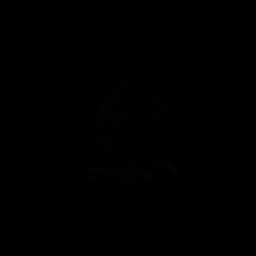
[im 18/72]
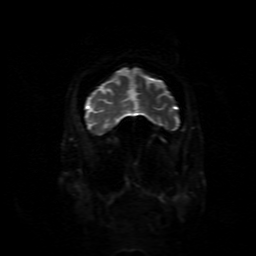
[im 36/72]
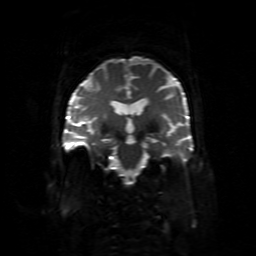
[im 54/72]
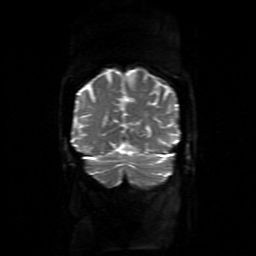
[im 72/72]
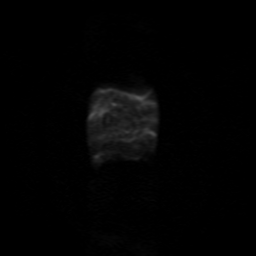

[Series 4: FLAIR · sagittal · 5.0mm · 0.23mm/px · 2 of 28 slices shown (1 of 2)]
[im 1/28]
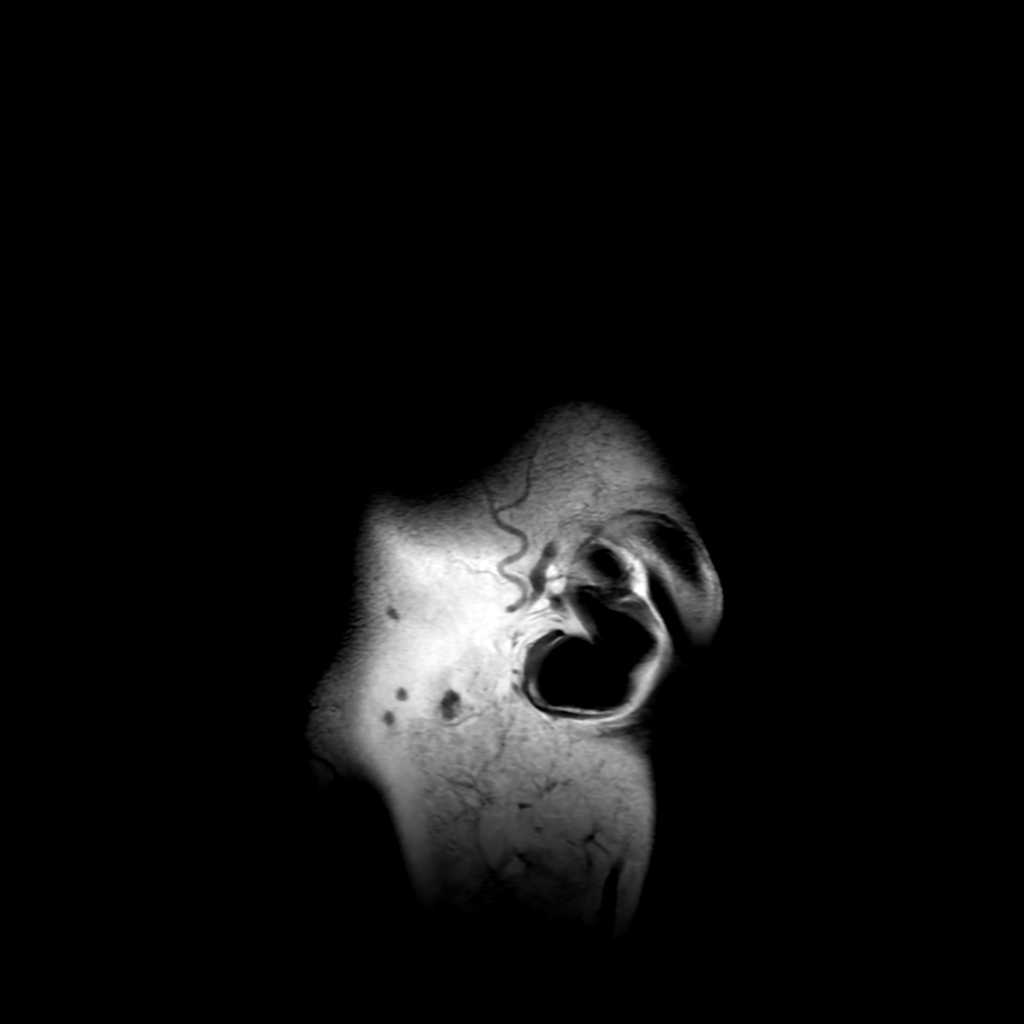
[im 28/28]
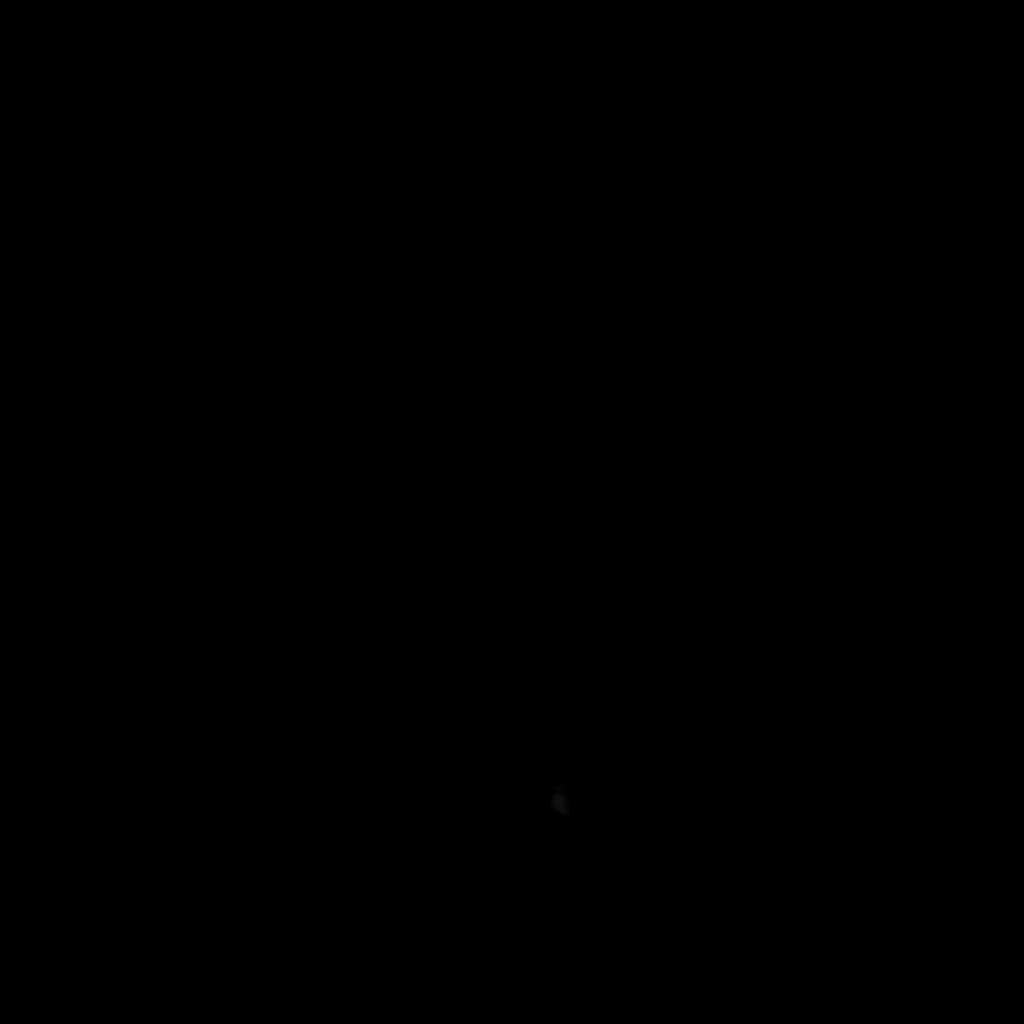

[Series 5: T2 · axial · 5.0mm · 0.23mm/px · 1 of 27 slices shown]
[im 1/27]
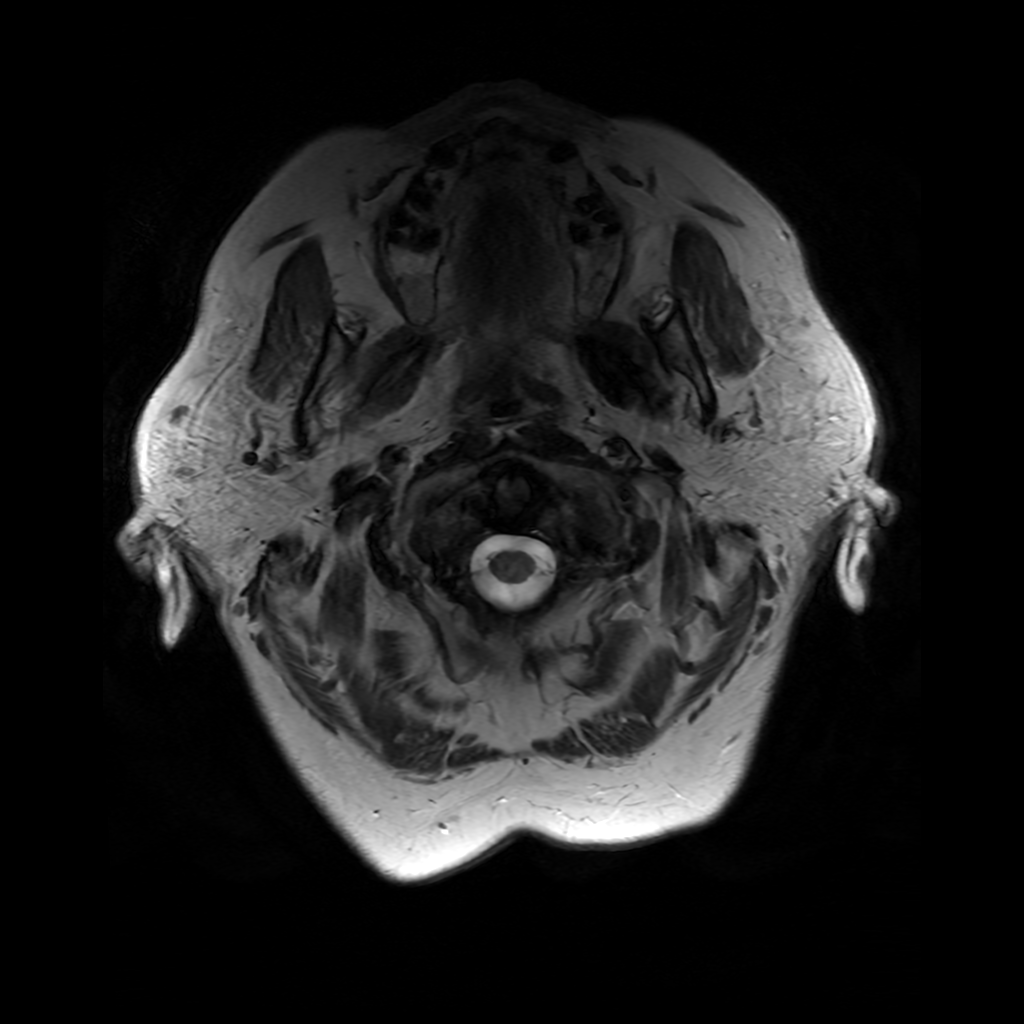

[Series 6: FLAIR · axial · 3.0mm · 0.41mm/px · z∈[-114,+22]mm · 2 of 26 slices shown (2 of 2)]
[im 1/26]
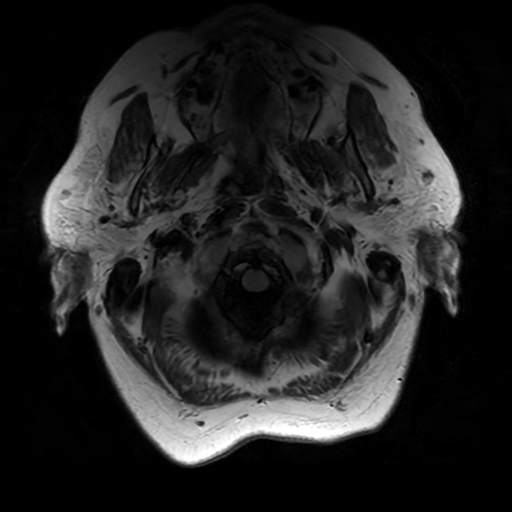
[im 26/26]
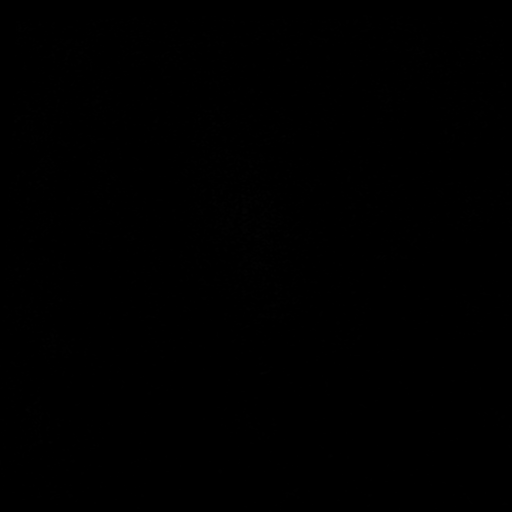

[Series 250: ADC · axial · 3.0mm · 1.02mm/px · z∈[-119,+25]mm · 4 of 53 slices shown (1 of 2)]
[im 1/53]
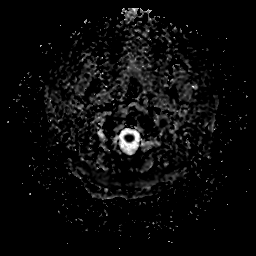
[im 18/53]
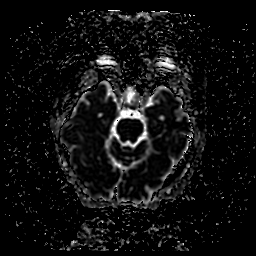
[im 35/53]
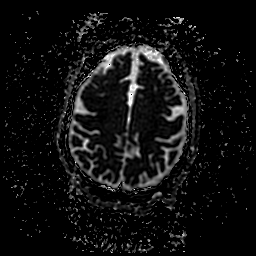
[im 53/53]
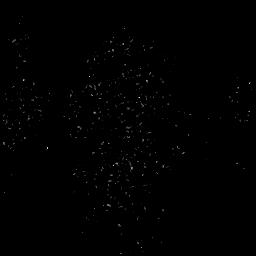

[Series 350: ADC · coronal · 5.0mm · 1.02mm/px · 3 of 36 slices shown (2 of 2)]
[im 1/36]
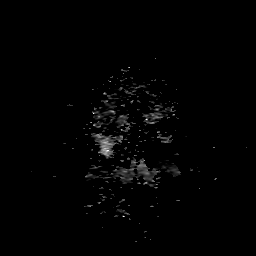
[im 18/36]
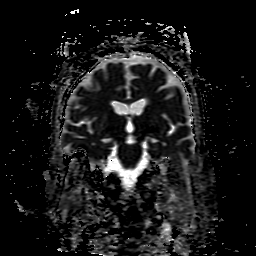
[im 36/36]
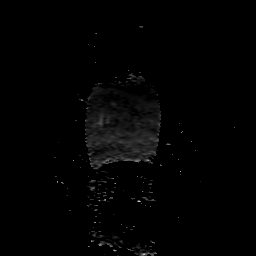

[25 of 48 positions shown; findings below may reference images not displayed]

FINDINGS: Mild motion artifact is present.

Brain: There is no acute infarction or intracranial hemorrhage.
There is no intracranial mass, mass effect, or edema. There is no
hydrocephalus or extra-axial fluid collection. Prominence of the
ventricles and sulci reflects generalized parenchymal volume loss.
Patchy foci of T2 hyperintensity in the supratentorial white matter
nonspecific but may reflect mild chronic microvascular ischemic
changes.

Vascular: Major vessel flow voids at the skull base are preserved.

Skull and upper cervical spine: Normal marrow signal is preserved.

Sinuses/Orbits: Paranasal sinuses are aerated. No significant
orbital abnormality on this nondedicated study.

Other: Sella is unremarkable.  Mastoid air cells are clear.
IMPRESSION: No evidence of recent infarction, hemorrhage, or mass. Mild chronic
microvascular ischemic changes.

## 2021-02-26 MED ORDER — ALPRAZOLAM 0.5 MG PO TABS: ORAL_TABLET | ORAL | 0 refills | Status: DC

## 2021-02-26 MED ORDER — LORAZEPAM 2 MG/ML IJ SOLN: 0.5000 mg | Freq: Once | INTRAMUSCULAR | Status: DC | PRN

## 2021-02-26 NOTE — Telephone Encounter (Signed)
[employer] medicare order sent to GI. No auth they will reach out to the patient to schedule.  

## 2021-02-26 NOTE — ED Triage Notes (Signed)
Pt reports was at her ophthalmologist who referred pt to come to the ED for an MRI and lumbar puncture due to fluid in the back of both eyes

## 2021-02-26 NOTE — ED Provider Notes (Signed)
Emergency Medicine Provider Triage Evaluation Note  Andrea Marks, a 70 y.o. female evaluated in triage.  Phone call received from Dr. Cathey Endow, ophthalmologist.  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  He initially saw her on April 4 and noticed mild optic disc swelling on the right.  He states she has known bad diabetic retinopathy.  She went to retina specialist and neurology.  Neurologist sent her to the ophthalmologist today for 2 to 3 days of patient loss in the right eye.  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  BP (!) 149/81 (BP Location: Right Arm)   Pulse (!) 108   Temp 98.6 F (37 C) (Oral)   Resp (!) 24   SpO2 95%   Patient is alert, no acute distress, normal work of breathing    Medically screening exam initiated at 5:06 PM. Appropriate orders placed.  Andrea Marks was informed that the remainder of the evaluation will be completed by another provider, this initial triage assessment does not replace that evaluation, and the importance of remaining in the ED until their evaluation is complete.       Andrea Marks, [name], PA-C 02/26/21 1708    Andrea Sprout, MD 03/02/21 [phone]

## 2021-02-26 NOTE — ED Provider Notes (Signed)
[facility] EMERGENCY DEPARTMENT Provider Note   CSN: [id number] Arrival date & time: 02/26/21  1545     History Chief Complaint  Patient presents with  . Eye Problem    Andrea Marks is a 70 y.o. female.  Patient is a 70 year old female with a history of diabetes, hypertension, hyperlipidemia and past history approximately 30 years ago which she says she had pseudotumor cerebri who is presenting today from ophthalmology and neurology office for further evaluation.  Patient reports since having cataract surgery in October she has had ongoing headaches but only 3 to 4 days ago noted change in vision in her right eye.  She reports on the temporal portion of her right eye it has been blurry.  No loss of vision.  Her left eye feels completely normal.  She did see Dr. Valetta Close with ophthalmology at the beginning of this month on 02/06/2021.  At that time she was noted to have mild disc edema bilaterally.  When she saw Dr. Jannifer Franklin today there was concern that there was further ophthalmologic issues going on and she had seen a retinal specialist before who thought everything was normal.  Today at Dr. Romeo Apple office she was noted to have significantly worsening papilledema and they decided to send her to the emergency room for an MRI with and without contrast and an MRV.  Also recommended doing a CRP and sed rate and then an LP if those things are normal to check her opening pressure..  Patient denies any temporal artery tenderness.  She has had no nausea vomiting or fevers.  She denies any neck pain.  The history is provided by the patient and medical records.  Eye Problem Location:  Right eye Quality: blurry vision in the right lateral eye for the last few days. Severity:  Moderate Onset quality:  Gradual Duration:  4 days Timing:  Constant Progression:  Worsening Chronicity:  New Relieved by:  None tried Worsened by:  Nothing Ineffective treatments:  None tried Associated  symptoms: blurred vision and headaches   Associated symptoms: no swelling, no vomiting and no weakness   Risk factors comment:  Bilateral cataract surgery, HTN, DM, HLD      Past Medical History:  Diagnosis Date  . Depression   . Diabetes mellitus   . GERD (gastroesophageal reflux disease)   . Hyperlipidemia   . Hypertension   . Obesity     Patient Active Problem List   Diagnosis Date Noted  . 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 Degenerative arthritis of knee, bilateral 10/30/2020  . Degenerative arthritis of right knee 12/25/2018  . Fatigue 12/15/2018  . Hyperlipidemia associated with type 2 diabetes mellitus ([place]) 12/15/2018  . B12 deficiency 12/15/2018  . AC (acromioclavicular) joint arthritis 06/20/2018  . Left rotator cuff tear 05/29/2018  . Hyperlipidemia LDL goal <70 12/19/2017  . Morbid obesity ([place]) 11/15/2016  . Type 2 diabetes mellitus with hyperglycemia, with long-term current use of insulin ([place]) 10/23/2015  . 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 Thrombophlebitis leg 02/23/2011  . TINEA CORPORIS 12/30/2008  . DEPRESSION 12/14/2006  . Essential hypertension 12/14/2006  . GERD 12/14/2006    Past Surgical History:  Procedure Laterality Date  . ABDOMINAL HYSTERECTOMY  1991   BSO     OB History   No obstetric history on file.     Family History  Problem Relation Age of Onset  . Macular degeneration Mother   . Heart disease Mother        syncope  .  Aortic stenosis Mother   . Lung disease Father 95       mesothelioma  . Cancer Maternal Aunt        stomach  . Heart disease Paternal Uncle        cabg  . Diabetes Paternal Uncle   . Heart disease Paternal Uncle   . Diabetes Paternal Uncle   . Diabetes Paternal Uncle   . Heart disease Paternal Uncle   . Heart disease Paternal Uncle   . Heart disease Paternal Uncle   . Lung cancer Other        asbestos  . Diabetes Paternal Grandmother   . Cancer Other        lung    Social History   Tobacco  Use  . Smoking status: Former Smoker    Quit date: 1995    Years since quitting: 27.3  . Smokeless tobacco: Never Used  Substance Use Topics  . Alcohol use: No  . Drug use: No    Home Medications Prior to Admission medications   Medication Sig Start Date End Date Taking? Authorizing Provider  acetaminophen (TYLENOL) 650 MG CR tablet Take 650 mg by mouth 2 (two) times daily as needed. For pain    [provider]  ALPRAZolam Duanne Moron) 0.5 MG tablet Take 2 tablets approximately 45 minutes prior to the MRI study, take a third tablet if needed. 02/26/21   Kathrynn Ducking, MD  aspirin 81 MG tablet Take 81 mg by mouth daily.    [provider]  benazepril (LOTENSIN) 20 MG tablet TAKE 1 TABLET DAILY 02/24/21   Carollee Herter, Alferd Apa, DO  Blood Glucose Monitoring Suppl (ONE TOUCH ULTRA SYSTEM KIT) W/DEVICE KIT 1 kit by Does not apply route once. 04/06/13   Philemon Kingdom, MD  Continuous Blood Gluc Receiver (FREESTYLE LIBRE 14 DAY READER) DEVI 1 Device by Does not apply route See admin instructions. Use for continuous monitor glucose 11/21/19   Philemon Kingdom, MD  Continuous Blood Gluc Sensor (FREESTYLE LIBRE 14 DAY SENSOR) MISC Apply new sensor every 14 days for continuous glucose monitoring. 03/11/20   Philemon Kingdom, MD  COVID-19 mRNA vaccine, Pfizer, 30 MCG/0.3ML injection AS DIRECTED 09/22/20 09/22/21  Carlyle Basques, MD  estradiol (ESTRACE) 1 MG tablet Take 1 tablet (1 mg total) by mouth daily. 01/01/21   Carollee Herter, Kendrick Fries R, DO  ezetimibe-simvastatin (VYTORIN) 10-40 MG tablet TAKE 1 TABLET AT BEDTIME 02/24/21   Carollee Herter, Kendrick Fries R, DO  fenofibrate 160 MG tablet TAKE 1 TABLET DAILY 02/24/21   Carollee Herter, Alferd Apa, DO  FLUoxetine (PROZAC) 20 MG tablet Take 3 tablets (60 mg total) by mouth daily. 01/01/21   Roma Schanz R, DO  glucose blood (ONETOUCH ULTRA) test strip Use to check blood sugar 3 times a day 11/08/19   Philemon Kingdom, MD  insulin aspart (NOVOLOG FLEXPEN)  100 UNIT/ML FlexPen Inject 18-24 Units into the skin 3 (three) times daily with meals. 12/18/20   Philemon Kingdom, MD  Insulin Glargine [facility] KWIKPEN) 100 UNIT/ML Inject 60 Units into the skin at bedtime. 12/18/20   Philemon Kingdom, MD  Insulin Pen Needle (PEN NEEDLES) 32G X 4 MM MISC USE AS INSTRUCTED WITH LANCETS. 12/10/20   Philemon Kingdom, MD  metFORMIN (GLUCOPHAGE) 850 MG tablet Take 1 tablet in the AM, and 2 tablets in the PM. 12/18/20   Philemon Kingdom, MD  pantoprazole (PROTONIX) 40 MG tablet Take 1 tablet (40 mg total) by mouth daily. Pt due for office visit  12/29/20   Carollee Herter, Kendrick Fries R, DO  Semaglutide, 1 MG/DOSE, (OZEMPIC, 1 MG/DOSE,) 4 MG/3ML SOPN INJECT $RemoveBef'1MG'eZrJseMrDl$  SUBCUTANEOUSLY  ONCE A WEEK. 12/18/20   Philemon Kingdom, MD    Allergies    Hydrocodone-acetaminophen, Oxycodone hcl, Penicillins, and Prednisone  Review of Systems   Review of Systems  Eyes: Positive for blurred vision.  Gastrointestinal: Negative for vomiting.  Neurological: Positive for headaches. Negative for weakness.  All other systems reviewed and are negative.   Physical Exam Updated Vital Signs BP (!) 154/74 (BP Location: Right Arm)   Pulse 96   Temp 98.6 F (37 C) (Oral)   Resp 18   SpO2 94%   Physical Exam Vitals and nursing note reviewed.  Constitutional:      General: She is not in acute distress.    Appearance: She is well-developed. She is obese.  HENT:     Head: Normocephalic and atraumatic.  Eyes:     General:        Right eye: No discharge.        Left eye: No discharge.     Conjunctiva/sclera: Conjunctivae normal.     Pupils: Pupils are equal, round, and reactive to light.     Comments: Decreased vision in the right temporal portion of the eye  Cardiovascular:     Rate and Rhythm: Normal rate and regular rhythm.     Heart sounds: Normal heart sounds. No murmur heard. No friction rub.  Pulmonary:     Effort: Pulmonary effort is normal.     Breath sounds: Normal breath  sounds. No wheezing or rales.  Abdominal:     General: Bowel sounds are normal. There is no distension.     Palpations: Abdomen is soft.     Tenderness: There is no abdominal tenderness. There is no guarding or rebound.  Musculoskeletal:        General: No tenderness. Normal range of motion.     Comments: No edema  Skin:    General: Skin is warm and dry.     Findings: No rash.  Neurological:     Mental Status: She is alert and oriented to person, place, and time. Mental status is at baseline.     Cranial Nerves: Cranial nerves are intact. No cranial nerve deficit, dysarthria or facial asymmetry.     Sensory: No sensory deficit.     Motor: No weakness or pronator drift.     Coordination: Coordination is intact. Coordination normal.     Gait: Gait normal.  Psychiatric:        Mood and Affect: Mood normal.        Behavior: Behavior normal.     ED Results / Procedures / Treatments   Labs (all labs ordered are listed, but only abnormal results are displayed) Labs Reviewed  BASIC METABOLIC PANEL - Abnormal; Notable for the following components:      Result Value   Glucose, Bld 138 (*)    Creatinine, Ser 1.08 (*)    GFR, Estimated 55 (*)    All other components within normal limits  CBC WITH DIFFERENTIAL/PLATELET - Abnormal; Notable for the following components:   RBC 5.28 (*)    HCT 46.6 (*)    Abs Immature Granulocytes 0.11 (*)    All other components within normal limits  SEDIMENTATION RATE  C-REACTIVE PROTEIN    EKG None  Radiology MR Brain Wo Contrast (neuro protocol)  Result Date: 02/26/2021 CLINICAL DATA:  Rapidly progressive optic disc edema EXAM: MRI  HEAD WITHOUT CONTRAST TECHNIQUE: Multiplanar, multiecho pulse sequences of the brain and surrounding structures were obtained without intravenous contrast. COMPARISON:  None. FINDINGS: Mild motion artifact is present. Brain: There is no acute infarction or intracranial hemorrhage. There is no intracranial mass, mass  effect, or edema. There is no hydrocephalus or extra-axial fluid collection. Prominence of the ventricles and sulci reflects generalized parenchymal volume loss. Patchy foci of T2 hyperintensity in the supratentorial white matter nonspecific but may reflect mild chronic microvascular ischemic changes. Vascular: Major vessel flow voids at the skull base are preserved. Skull and upper cervical spine: Normal marrow signal is preserved. Sinuses/Orbits: Paranasal sinuses are aerated. No significant orbital abnormality on this nondedicated study. Other: Sella is unremarkable.  Mastoid air cells are clear. IMPRESSION: No evidence of recent infarction, hemorrhage, or mass. Mild chronic microvascular ischemic changes. Electronically Signed   By: Macy Mis M.D.   On: 02/26/2021 18:36    Procedures Procedures   Medications Ordered in ED Medications  LORazepam (ATIVAN) injection 0.5 mg (has no administration in time range)    ED Course  I have reviewed the triage vital signs and the nursing notes.  Pertinent labs & imaging results that were available during my care of the patient were reviewed by me and considered in my medical decision making (see chart for details).    MDM Rules/Calculators/A&P                          Elderly female presenting today due to blurred vision in the temporal portion of her right eye that started a few days ago.  This is in the setting of worsening papilledema seen by her ophthalmologist Dr. Valetta Close.  She did see Dr. Jannifer Franklin with neurology today as well.  Exam was otherwise within normal limits.  Recommended the patient get an MRI with and without contrast, MRV as well as an LP if those things were normal.  Patient's sed rate is within normal limits and low suspicion for temporal arteritis at this time.  Patient's other labs are also within normal limits with stable glucose, normal creatinine, white count and hemoglobin.  Initial MRI without showed no evidence of recent  infarction, hemorrhage or mass with mild chronic microvascular ischemic changes.  Unfortunately the other MRIs were not ordered and patient went back for these MRIs prior to getting an LP.  Final Clinical Impression(s) / ED Diagnoses Final diagnoses:  None    Rx / DC Orders ED Discharge Orders    None       Blanchie Dessert, MD 03/02/21 [id number]

## 2021-02-26 NOTE — Progress Notes (Signed)
Reason for visit: Papilledema  Referring physician: Dr. Farrel Conners Andrea Marks is a 70 y.o. female  History of present illness:  Andrea Marks is a 70 year old right-handed white female with a history of diabetes and morbid obesity.  The patient apparently was seen and evaluated 35 years ago by Dr. Erling Cruz for what sounds like pseudotumor cerebri.  She had a lumbar puncture, but does not recall that she was ever on medical therapy for this.  The patient had cataract surgery done in October 2021.  She has been noted to have some mild papilledema and macular edema bilaterally.  The patient does have some slight discomfort around the eyes off and on.  She denies any muffled hearing or pulsatile tinnitus.  She denies any neck discomfort.  She has not had any numbness or weakness of the face, arms, legs.  She denies any changes in her balance.  She has not had any double vision.  Two days ago, she awakened with a visual disturbance affecting the right eye only.  The temporal field appears to be affected, respecting the vertical plane, not horizontal.  The patient has no visual changes involving the left eye whatsoever.  The onset of the visual disturbance has gradually worsened, and is completely painless.  She comes here for an evaluation.  She has not told her ophthalmologist about this recent visual change.  Past Medical History:  Diagnosis Date  . Depression   . Diabetes mellitus   . GERD (gastroesophageal reflux disease)   . Hyperlipidemia   . Hypertension   . Obesity     Past Surgical History:  Procedure Laterality Date  . ABDOMINAL HYSTERECTOMY  1991   BSO    Family History  Problem Relation Age of Onset  . Macular degeneration Mother   . Heart disease Mother        syncope  . Aortic stenosis Mother   . Lung disease Father 33       mesothelioma  . Cancer Maternal Aunt        stomach  . Heart disease Paternal Uncle        cabg  . Diabetes Paternal Uncle   . Heart  disease Paternal Uncle   . Diabetes Paternal Uncle   . Diabetes Paternal Uncle   . Heart disease Paternal Uncle   . Heart disease Paternal Uncle   . Heart disease Paternal Uncle   . Lung cancer Other        asbestos  . Diabetes Paternal Grandmother   . Cancer Other        lung    Social history:  reports that she quit smoking about 27 years ago. She has never used smokeless tobacco. She reports that she does not drink alcohol and does not use drugs.  Medications:  Prior to Admission medications   Medication Sig Start Date End Date Taking? Authorizing Provider  acetaminophen (TYLENOL) 650 MG CR tablet Take 650 mg by mouth 2 (two) times daily as needed. For pain   Yes [provider]  aspirin 81 MG tablet Take 81 mg by mouth daily.   Yes [provider]  benazepril (LOTENSIN) 20 MG tablet TAKE 1 TABLET DAILY 02/24/21  Yes Roma Schanz R, DO  Blood Glucose Monitoring Suppl (ONE TOUCH ULTRA SYSTEM KIT) W/DEVICE KIT 1 kit by Does not apply route once. 04/06/13  Yes Philemon Kingdom, MD  Continuous Blood Gluc Receiver (FREESTYLE LIBRE 14 DAY READER) DEVI 1 Device by Does not  apply route See admin instructions. Use for continuous monitor glucose 11/21/19  Yes Philemon Kingdom, MD  Continuous Blood Gluc Sensor (FREESTYLE LIBRE 14 DAY SENSOR) MISC Apply new sensor every 14 days for continuous glucose monitoring. 03/11/20  Yes Philemon Kingdom, MD  COVID-19 mRNA vaccine, Pfizer, 30 MCG/0.3ML injection AS DIRECTED 09/22/20 09/22/21 Yes Carlyle Basques, MD  estradiol (ESTRACE) 1 MG tablet Take 1 tablet (1 mg total) by mouth daily. 01/01/21  Yes Carollee Herter, Kendrick Fries R, DO  ezetimibe-simvastatin (VYTORIN) 10-40 MG tablet TAKE 1 TABLET AT BEDTIME 02/24/21  Yes Roma Schanz R, DO  fenofibrate 160 MG tablet TAKE 1 TABLET DAILY 02/24/21  Yes Roma Schanz R, DO  FLUoxetine (PROZAC) 20 MG tablet Take 3 tablets (60 mg total) by mouth daily. 01/01/21  Yes Roma Schanz R, DO   glucose blood (ONETOUCH ULTRA) test strip Use to check blood sugar 3 times a day 11/08/19  Yes Philemon Kingdom, MD  insulin aspart (NOVOLOG FLEXPEN) 100 UNIT/ML FlexPen Inject 18-24 Units into the skin 3 (three) times daily with meals. 12/18/20  Yes Philemon Kingdom, MD  Insulin Glargine [facility] KWIKPEN) 100 UNIT/ML Inject 60 Units into the skin at bedtime. 12/18/20  Yes Philemon Kingdom, MD  Insulin Pen Needle (PEN NEEDLES) 32G X 4 MM MISC USE AS INSTRUCTED WITH LANCETS. 12/10/20  Yes Philemon Kingdom, MD  metFORMIN (GLUCOPHAGE) 850 MG tablet Take 1 tablet in the AM, and 2 tablets in the PM. 12/18/20  Yes Philemon Kingdom, MD  pantoprazole (PROTONIX) 40 MG tablet Take 1 tablet (40 mg total) by mouth daily. Pt due for office visit 12/29/20  Yes Lowne Chase, Yvonne R, DO  Semaglutide, 1 MG/DOSE, (OZEMPIC, 1 MG/DOSE,) 4 MG/3ML SOPN INJECT $RemoveBef'1MG'kYySifIFst$  SUBCUTANEOUSLY  ONCE A WEEK. 12/18/20  Yes Philemon Kingdom, MD  ketorolac (ACULAR) 0.5 % ophthalmic solution  10/06/20   [provider]  prednisoLONE acetate (PRED FORTE) 1 % ophthalmic suspension SMARTSIG:In Eye(s) 09/04/20   [provider]      Allergies  Allergen Reactions  . Hydrocodone-Acetaminophen Other (See Comments)    unknown  . Oxycodone Hcl Other (See Comments)    unknown  . Penicillins Other (See Comments)    unknown  . Prednisone     ROS:  Out of a complete 14 system review of symptoms, the patient complains only of the following symptoms, and all other reviewed systems are negative.  Recent visual change Papilledema  Blood pressure (!) 175/85, pulse 91, height $RemoveBe'5\' 3"'uKiNZDtMu$  (1.6 m), weight 276 lb 9.6 oz (125.5 kg).  Physical Exam  General: The patient is alert and cooperative at the time of the examination.  The patient is markedly obese.  Eyes: Pupils are equal, round, and reactive to light. Disc margins are minimally blurred bilaterally.  Neck: The neck is supple, no carotid bruits are noted.  Respiratory: The  respiratory examination is clear.  Cardiovascular: The cardiovascular examination reveals a regular rate and rhythm, no obvious murmurs or rubs are noted.  Skin: Extremities are with 2+ edema below the knees bilaterally.  Neurologic Exam  Mental status: The patient is alert and oriented x 3 at the time of the examination. The patient has apparent normal recent and remote memory, with an apparently normal attention span and concentration ability.  Cranial nerves: Facial symmetry is present. There is good sensation of the face to pinprick and soft touch bilaterally. The strength of the facial muscles and the muscles to head turning and shoulder shrug are normal bilaterally. Speech  is well enunciated, no aphasia or dysarthria is noted. Extraocular movements are full. Visual fields are full when using both eyes, with the right eye, there appears to be decreased vision in the temporal field, vision in the left eye is full. The tongue is midline, and the patient has symmetric elevation of the soft palate. No obvious hearing deficits are noted.  Motor: The motor testing reveals 5 over 5 strength of all 4 extremities. Good symmetric motor tone is noted throughout.  Sensory: Sensory testing is intact to pinprick, soft touch, vibration sensation, and position sense on all 4 extremities. No evidence of extinction is noted.  Coordination: Cerebellar testing reveals good finger-nose-finger and heel-to-shin bilaterally.  Gait and station: Gait is normal. Tandem gait is normal. Romberg is negative. No drift is seen.  Reflexes: Deep tendon reflexes are symmetric and normal bilaterally. Toes are downgoing bilaterally.   Assessment/Plan:  1.  Recent visual change, OD  2.  Papilledema  The patient will be sent for a sedimentation rate today, she will contact her ophthalmologist for an urgent evaluation.  The patient has sustained a monocular visual change, a retinal detachment or possible optic nerve  involvement does need to be considered.  The patient will have MRI of the brain.  If the MRI study is unremarkable, we will do lumbar puncture.  The patient will follow-up otherwise in 3 to 4 months.  Addendum: I just talked with Dr. Jola Schmidt from ophthalmology.  The patient has had a significant change in visual acuity in both eyes, right greater than left, and a dramatic worsening of papilledema within the last 3 weeks.  He feels that an urgent evaluation is needed, I would agree with this.  We have asked the patient to go to the emergency room for an evaluation.  She will need MRI of the brain with and without contrast and MRV of the brain, she will require lumbar puncture if the above studies are unremarkable.  Jill Alexanders MD 02/26/2021 11:55 AM  [facility] [address] [place], [place] [address]  Phone [phone] Fax [phone]

## 2021-02-27 ENCOUNTER — Emergency Department (HOSPITAL_COMMUNITY): Payer: Medicare Other

## 2021-02-27 ENCOUNTER — Encounter (HOSPITAL_COMMUNITY): Payer: Self-pay | Admitting: Internal Medicine

## 2021-02-27 ENCOUNTER — Observation Stay (HOSPITAL_COMMUNITY): Payer: Medicare Other

## 2021-02-27 DIAGNOSIS — I10 Essential (primary) hypertension: Secondary | ICD-10-CM

## 2021-02-27 DIAGNOSIS — E1165 Type 2 diabetes mellitus with hyperglycemia: Secondary | ICD-10-CM | POA: Diagnosis not present

## 2021-02-27 DIAGNOSIS — H4710 Unspecified papilledema: Secondary | ICD-10-CM | POA: Diagnosis not present

## 2021-02-27 DIAGNOSIS — Z794 Long term (current) use of insulin: Secondary | ICD-10-CM | POA: Diagnosis not present

## 2021-02-27 LAB — CSF CELL COUNT WITH DIFFERENTIAL
RBC Count, CSF: 196 /mm3 — ABNORMAL HIGH
Tube #: 3
WBC, CSF: 1 /mm3 (ref 0–5)

## 2021-02-27 LAB — CBG MONITORING, ED
Glucose-Capillary: 152 mg/dL — ABNORMAL HIGH (ref 70–99)
Glucose-Capillary: 172 mg/dL — ABNORMAL HIGH (ref 70–99)

## 2021-02-27 LAB — SEDIMENTATION RATE: Sed Rate: 3 mm/hr (ref 0–40)

## 2021-02-27 LAB — GLUCOSE, CSF: Glucose, CSF: 99 mg/dL — ABNORMAL HIGH (ref 40–70)

## 2021-02-27 LAB — GLUCOSE, CAPILLARY
Glucose-Capillary: 174 mg/dL — ABNORMAL HIGH (ref 70–99)
Glucose-Capillary: 193 mg/dL — ABNORMAL HIGH (ref 70–99)
Glucose-Capillary: 180 mg/dL — ABNORMAL HIGH (ref 70–99)

## 2021-02-27 LAB — RESP PANEL BY RT-PCR (FLU A&B, COVID) ARPGX2
Influenza A by PCR: NEGATIVE
Influenza B by PCR: NEGATIVE
SARS Coronavirus 2 by RT PCR: NEGATIVE

## 2021-02-27 LAB — HEMOGLOBIN A1C
Hgb A1c MFr Bld: 5.9 % — ABNORMAL HIGH (ref 4.8–5.6)
Mean Plasma Glucose: 122.63 mg/dL

## 2021-02-27 LAB — PROTEIN, CSF: Total  Protein, CSF: 41 mg/dL (ref 15–45)

## 2021-02-27 LAB — C-REACTIVE PROTEIN: CRP: 3 mg/L (ref 0–10)

## 2021-02-27 LAB — HIV ANTIBODY (ROUTINE TESTING W REFLEX): HIV Screen 4th Generation wRfx: NONREACTIVE

## 2021-02-27 IMAGING — MR MR HEAD W/ CM
2 of 4 series · 17 of 48 positions shown · IV contrast (Gadavist)
Comparison: Prior noncontrast MRI from earlier the same day.

CONTRAST:  10mL GADAVIST GADOBUTROL 1 MMOL/ML IV SOLN

CLINICAL DATA: Initial evaluation for acute visual loss.

EXAM:
MRI HEAD WITH CONTRAST
MRV HEAD WITHOUT AND WITH CONTRAST
TECHNIQUE: Multiplanar, multiecho pulse sequences of the brain and surrounding
structures were obtained with intravenous contrast. Angiographic
images of the intracranial venous structures were obtained using MRV
technique without intravenous contrast.

[Series 16: T1 post-contrast · coronal · 5.0mm · 0.34mm/px · 9 of 28 slices shown (1 of 2)]
[im 1/28]
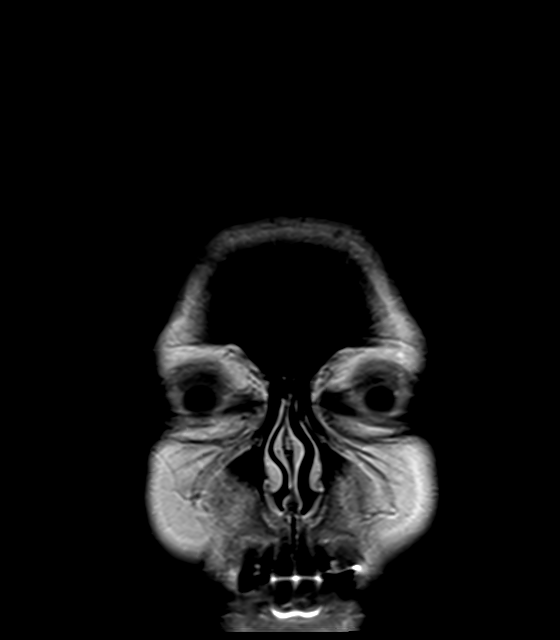
[im 4/28]
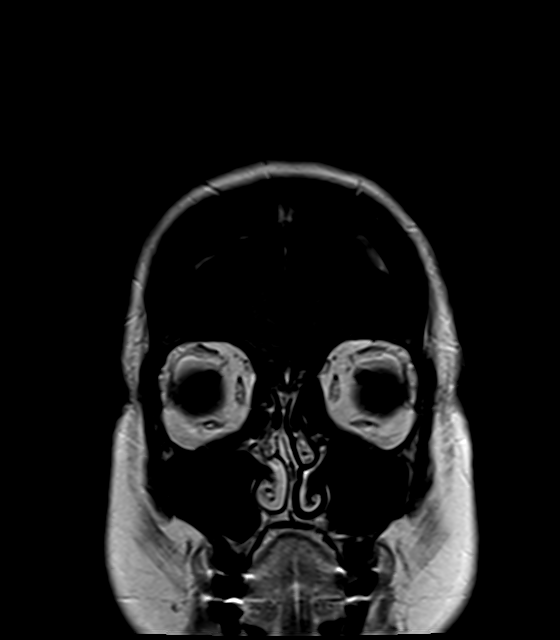
[im 7/28]
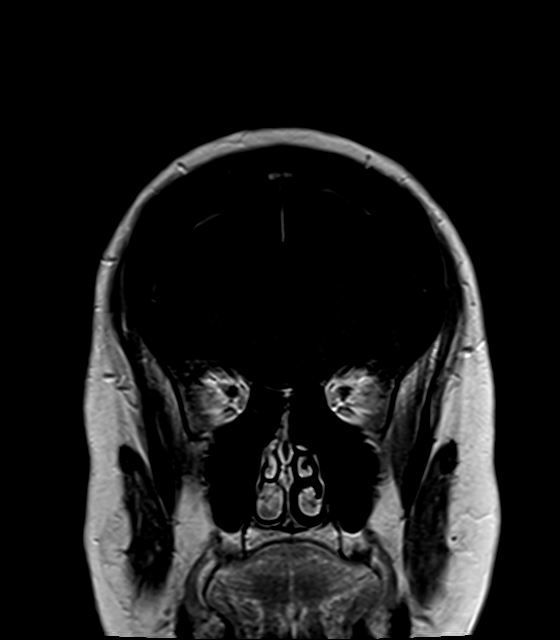
[im 11/28]
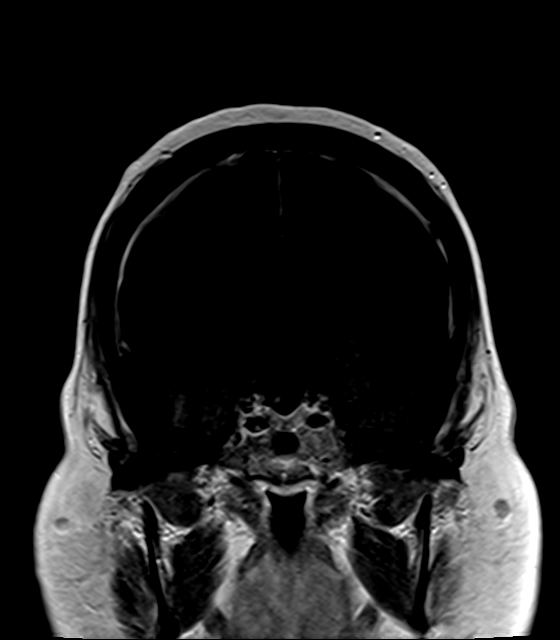
[im 14/28]
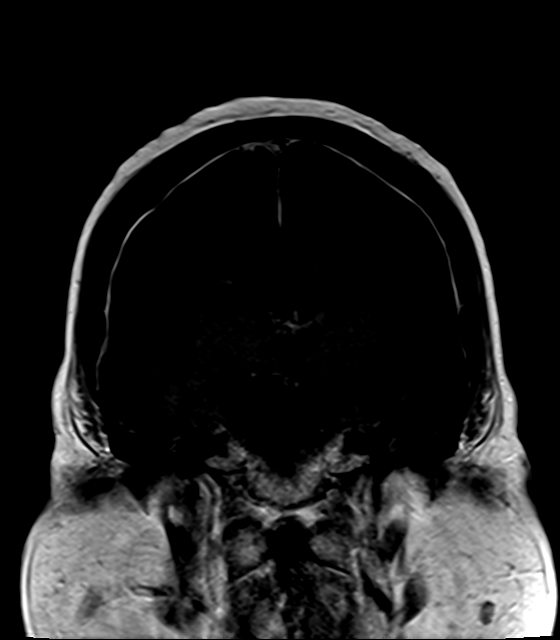
[im 17/28]
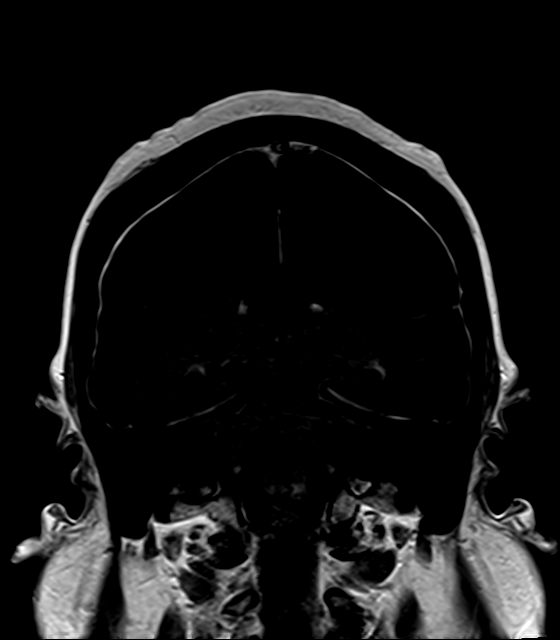
[im 21/28]
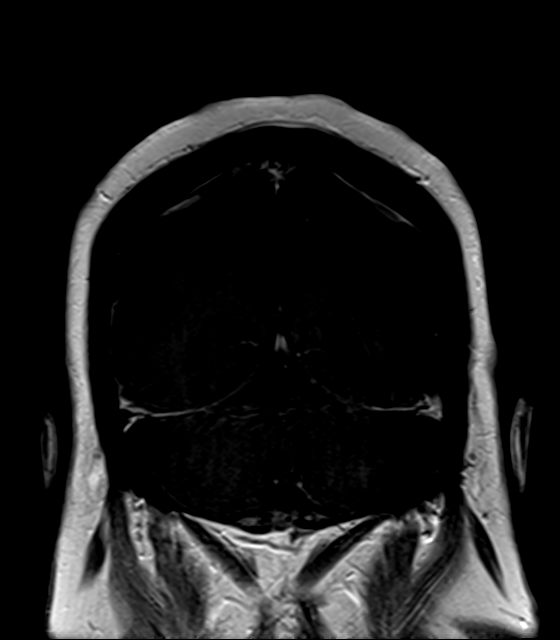
[im 24/28]
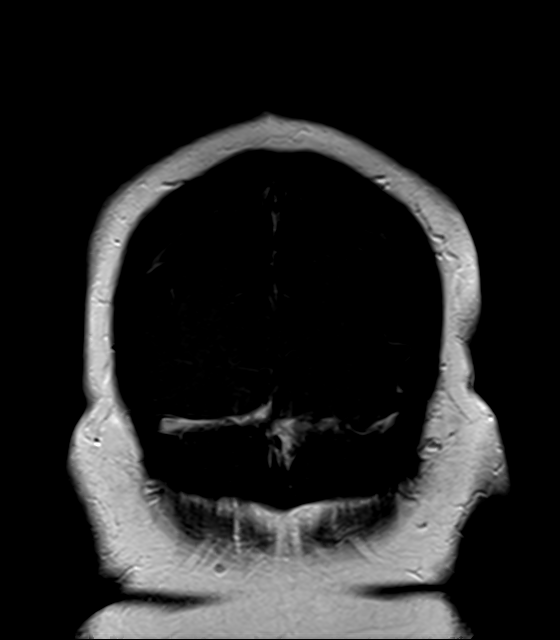
[im 28/28]
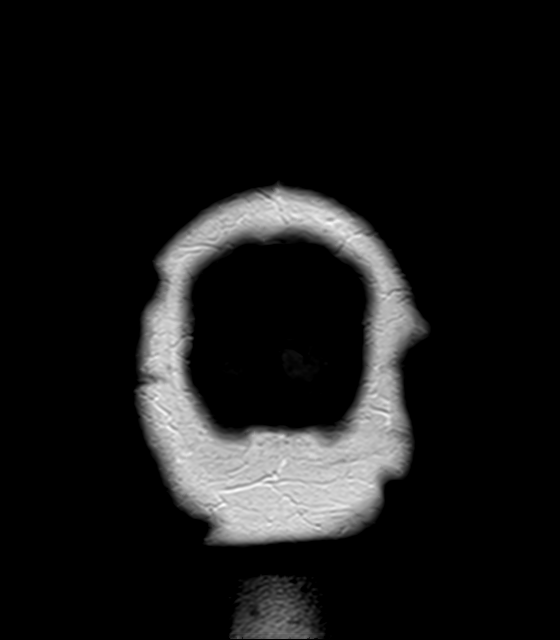

[Series 17: T1 post-contrast · sagittal · 5.0mm · 0.72mm/px · 8 of 23 slices shown (2 of 2)]
[im 1/23]
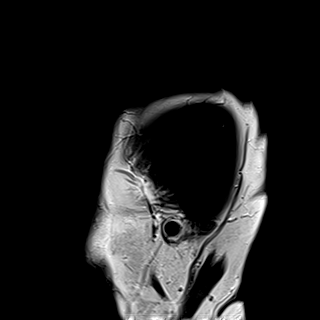
[im 4/23]
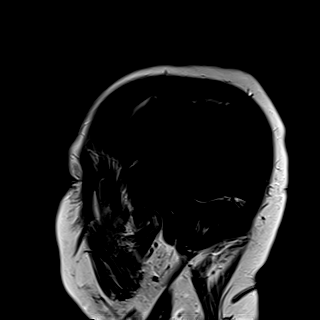
[im 7/23]
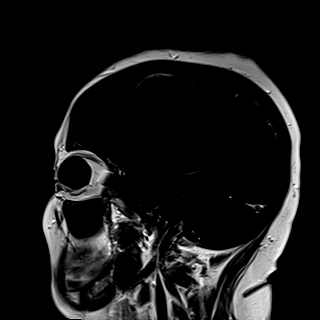
[im 10/23]
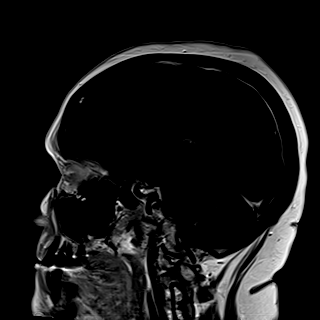
[im 13/23]
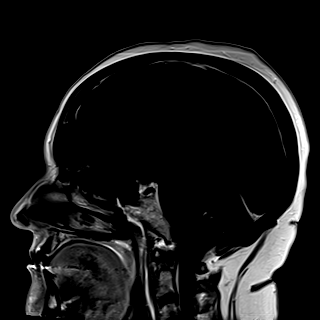
[im 16/23]
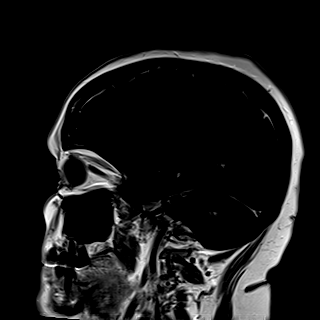
[im 19/23]
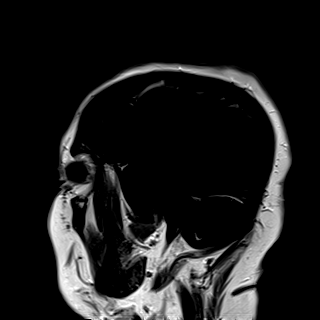
[im 23/23]
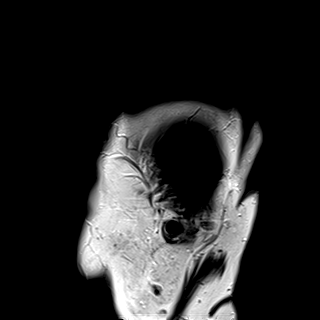

[17 of 48 positions shown; findings below may reference images not displayed]

FINDINGS: MR HEAD FINDINGS

Brain: Postcontrast only images of the brain were performed.
Sequences demonstrate diffuse smooth pachymeningeal enhancement,
fairly symmetric in nature overlying both cerebral hemispheres. This
was likely present on prior noncontrast brain MRI from earlier the
same day. Otherwise, no other abnormal enhancement seen within the
brain. No mass lesion, mass effect or midline shift. No
hydrocephalus or extra-axial fluid collection. Pituitary gland and
suprasellar region normal. Midline structures intact and normal. No
other findings to suggest intracranial hypotension.

Vascular: Normal intravascular enhancement seen throughout the
brain.

Skull and upper cervical spine: Craniocervical junction within
normal limits. Bone marrow signal grossly within normal limits. No
scalp soft tissue abnormality.

Sinuses/Orbits: Subtle bulging of the optic nerve disc at the
posterior right globe, suggesting papilledema (series 15, image 16).
No appreciable bulging of the optic disc at the left globe. Prior
bilateral ocular lens replacement. Globes and orbital soft tissues
demonstrate no other acute finding. Mild scattered mucosal
thickening noted within the ethmoidal air cells. Paranasal sinuses
are otherwise clear. No mastoid effusion. Inner ear structures
grossly normal.

Other: None.

MRV HEAD FINDINGS

Normal flow related signal and enhancement seen throughout the
superior sagittal sinus to the torcula. Torcula itself appears
patent. Transverse and sigmoid sinuses are patent as are the
visualized proximal internal jugular veins. Right transverse sinus
dominant, with a diffusely hypoplastic left transverse and sigmoid
sinus. Straight sinus, vein of LEONARD, internal cerebral veins, and
basal veins of LEONARD appear patent. No evidence for dural sinus
thrombosis. No appreciable dural sinus stenosis. No findings to
suggest cavernous sinus thrombosis.
IMPRESSION: 1. Diffuse smooth pachymeningeal thickening and enhancement
overlying both cerebral hemispheres. Finding is of uncertain
etiology or significance, and could be related to prior
intervention. A possible infectious or inflammatory process would be
the primary differential consideration. Correlation with CSF
analysis recommended as clinically warranted.
2. Subtle bulging of the optic nerve disc at the posterior right
globe, suggesting papilledema.
3. No other abnormal enhancement within the brain.
4. Normal intracranial MRV. No evidence for dural sinus thrombosis,
stenosis, or other abnormality.

## 2021-02-27 IMAGING — MR MR MRV HEAD WO/W CM
5 of 6 series · 39 of 48 positions shown · IV contrast (Gadavist)
Comparison: Prior noncontrast MRI from earlier the same day.

CONTRAST:  10mL GADAVIST GADOBUTROL 1 MMOL/ML IV SOLN

CLINICAL DATA: Initial evaluation for acute visual loss.

EXAM:
MRI HEAD WITH CONTRAST
MRV HEAD WITHOUT AND WITH CONTRAST
TECHNIQUE: Multiplanar, multiecho pulse sequences of the brain and surrounding
structures were obtained with intravenous contrast. Angiographic
images of the intracranial venous structures were obtained using MRV
technique without intravenous contrast.

[Series 6: tof_fl2d_paracor · coronal · 2.0mm · 0.98mm/px · 8 of 128 slices shown]
[im 1/128]
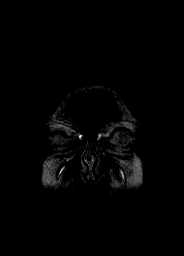
[im 19/128]
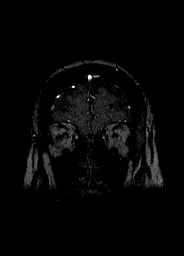
[im 37/128]
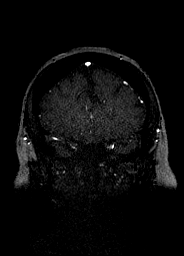
[im 55/128]
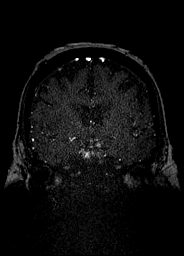
[im 73/128]
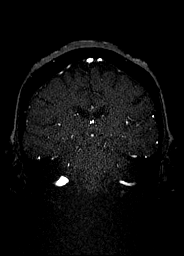
[im 91/128]
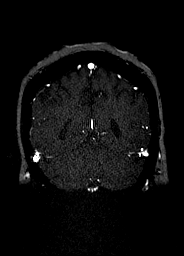
[im 109/128]
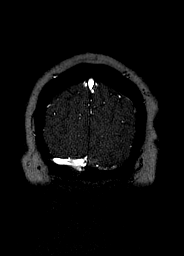
[im 128/128]
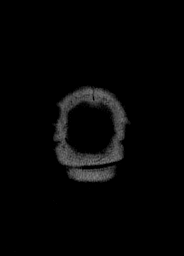

[Series 10: venous inhance coronal · coronal · portal-venous · 0.9mm · 0.57mm/px · 11 of 192 slices shown]
[im 1/192]
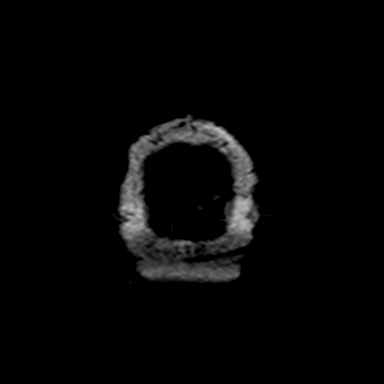
[im 20/192]
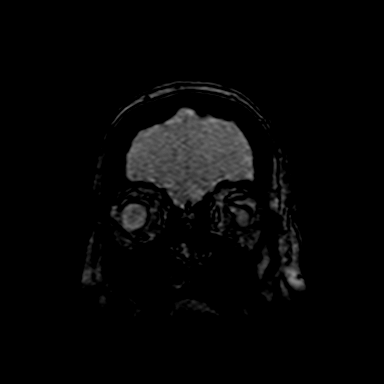
[im 39/192]
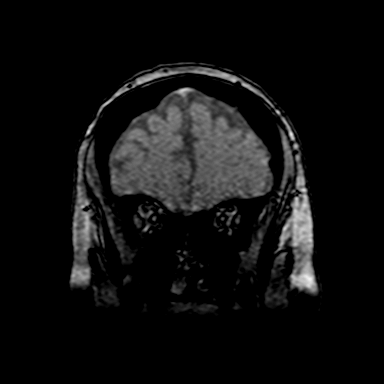
[im 58/192]
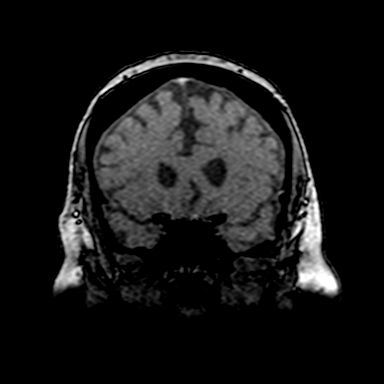
[im 77/192]
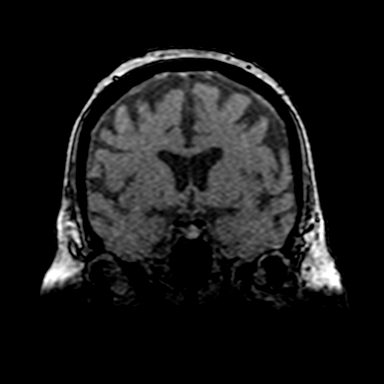
[im 96/192]
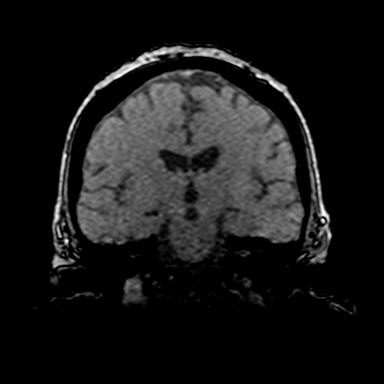
[im 115/192]
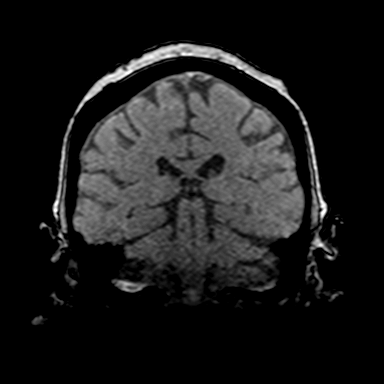
[im 134/192]
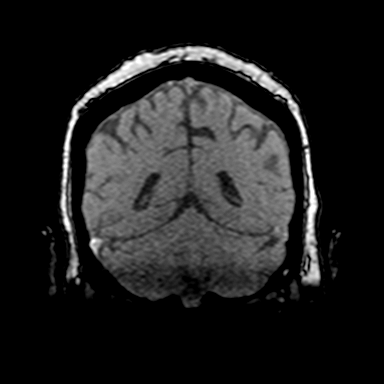
[im 153/192]
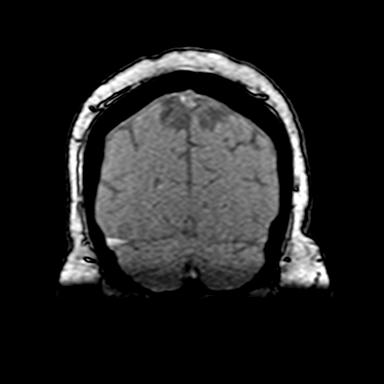
[im 172/192]
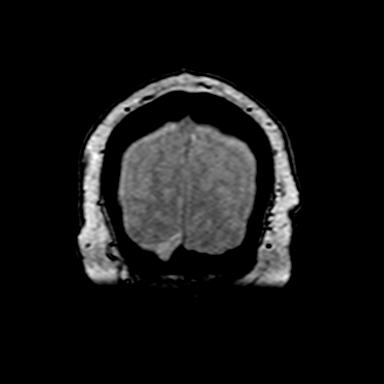
[im 192/192]
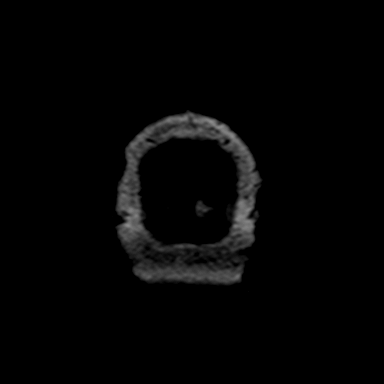

[Series 11: venous inhance coronal_msum · coronal · portal-venous · 0.9mm · 0.57mm/px · 11 of 192 slices shown]
[im 1/192]
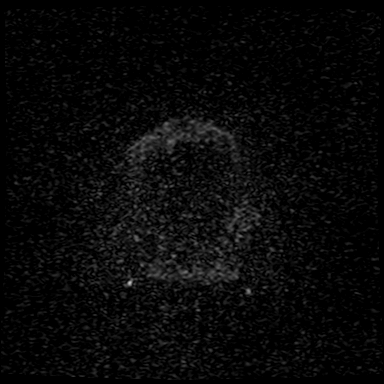
[im 20/192]
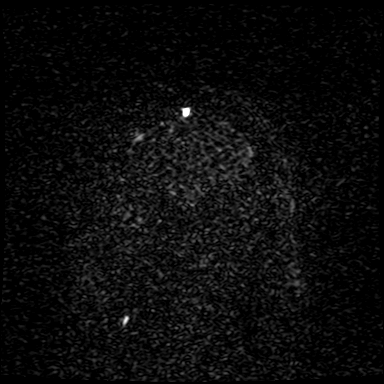
[im 39/192]
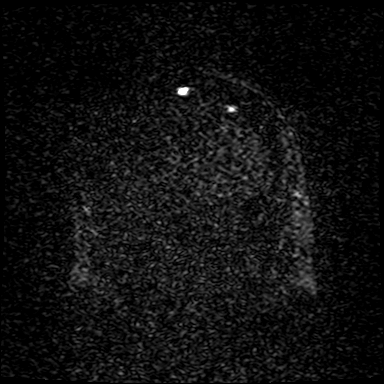
[im 58/192]
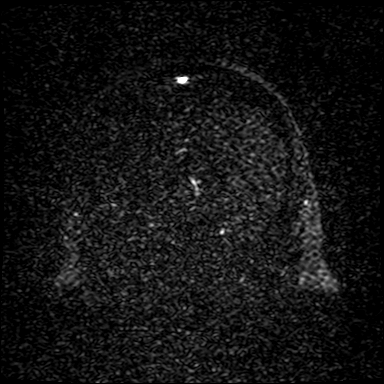
[im 77/192]
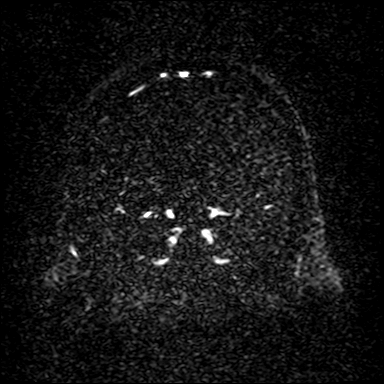
[im 96/192]
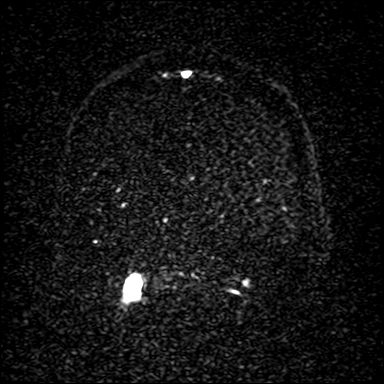
[im 115/192]
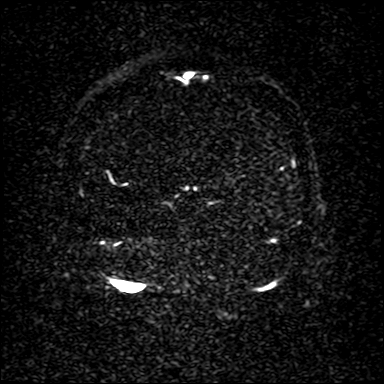
[im 134/192]
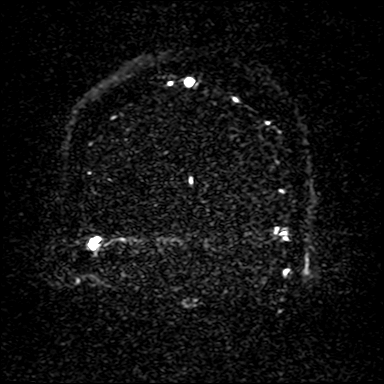
[im 153/192]
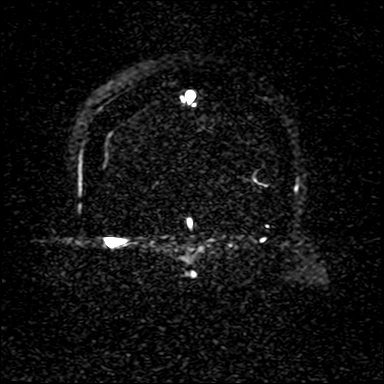
[im 172/192]
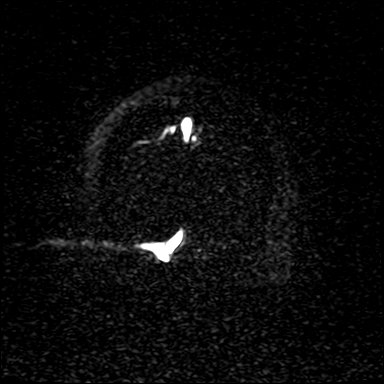
[im 192/192]
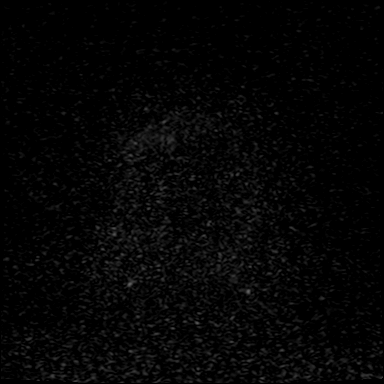

[Series 14: t1_fl3d_sag_p2_iso · sagittal · 1.0mm · 0.98mm/px · 8 of 144 slices shown]
[im 1/144]
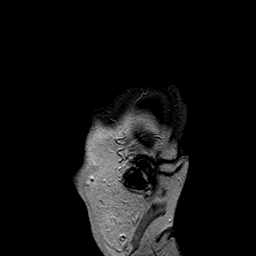
[im 21/144]
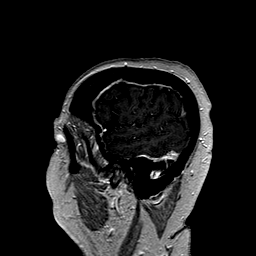
[im 41/144]
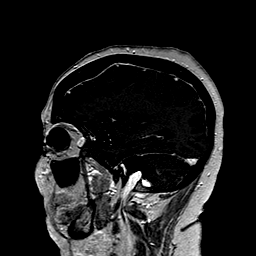
[im 62/144]
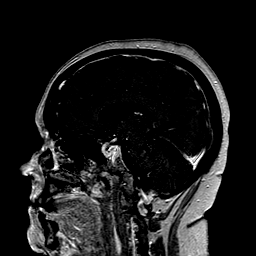
[im 82/144]
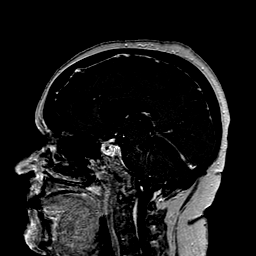
[im 103/144]
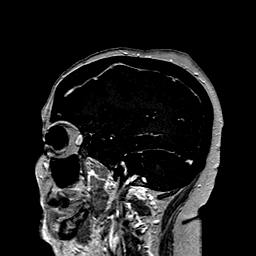
[im 123/144]
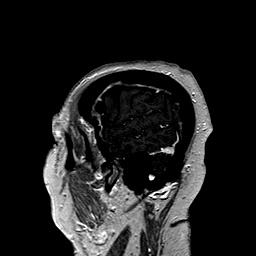
[im 144/144]
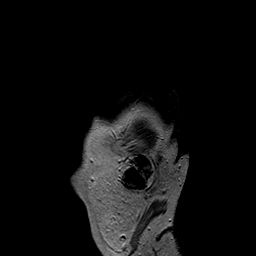

[Series 15: t1_fl3d_sag_p2_iso_mpr_ axial · axial · 2.0mm · 0.45mm/px · 1 of 78 slices shown]
[im 1/78]
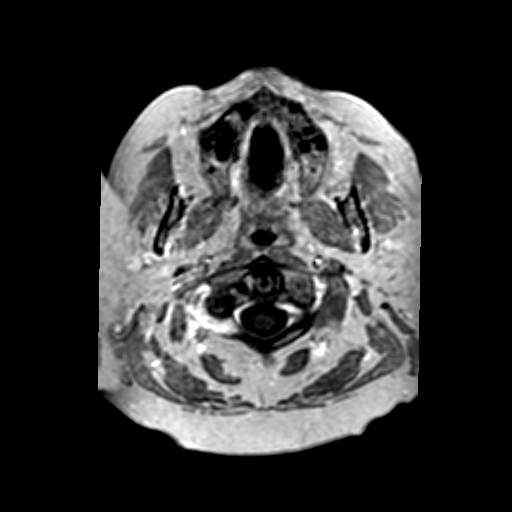

[39 of 48 positions shown; findings below may reference images not displayed]

FINDINGS: MR HEAD FINDINGS

Brain: Postcontrast only images of the brain were performed.
Sequences demonstrate diffuse smooth pachymeningeal enhancement,
fairly symmetric in nature overlying both cerebral hemispheres. This
was likely present on prior noncontrast brain MRI from earlier the
same day. Otherwise, no other abnormal enhancement seen within the
brain. No mass lesion, mass effect or midline shift. No
hydrocephalus or extra-axial fluid collection. Pituitary gland and
suprasellar region normal. Midline structures intact and normal. No
other findings to suggest intracranial hypotension.

Vascular: Normal intravascular enhancement seen throughout the
brain.

Skull and upper cervical spine: Craniocervical junction within
normal limits. Bone marrow signal grossly within normal limits. No
scalp soft tissue abnormality.

Sinuses/Orbits: Subtle bulging of the optic nerve disc at the
posterior right globe, suggesting papilledema (series 15, image 16).
No appreciable bulging of the optic disc at the left globe. Prior
bilateral ocular lens replacement. Globes and orbital soft tissues
demonstrate no other acute finding. Mild scattered mucosal
thickening noted within the ethmoidal air cells. Paranasal sinuses
are otherwise clear. No mastoid effusion. Inner ear structures
grossly normal.

Other: None.

MRV HEAD FINDINGS

Normal flow related signal and enhancement seen throughout the
superior sagittal sinus to the torcula. Torcula itself appears
patent. Transverse and sigmoid sinuses are patent as are the
visualized proximal internal jugular veins. Right transverse sinus
dominant, with a diffusely hypoplastic left transverse and sigmoid
sinus. Straight sinus, vein of LEONARD, internal cerebral veins, and
basal veins of LEONARD appear patent. No evidence for dural sinus
thrombosis. No appreciable dural sinus stenosis. No findings to
suggest cavernous sinus thrombosis.
IMPRESSION: 1. Diffuse smooth pachymeningeal thickening and enhancement
overlying both cerebral hemispheres. Finding is of uncertain
etiology or significance, and could be related to prior
intervention. A possible infectious or inflammatory process would be
the primary differential consideration. Correlation with CSF
analysis recommended as clinically warranted.
2. Subtle bulging of the optic nerve disc at the posterior right
globe, suggesting papilledema.
3. No other abnormal enhancement within the brain.
4. Normal intracranial MRV. No evidence for dural sinus thrombosis,
stenosis, or other abnormality.

## 2021-02-27 IMAGING — RF DG SPINAL PUNCT LUMBAR DIAG WITH FL CT GUIDANCE
4 series · 5 of 5 positions shown · non-contrast
Comparison: None

CLINICAL DATA: Abnormal MRI

EXAM:
DIAGNOSTIC LUMBAR PUNCTURE UNDER FLUOROSCOPIC GUIDANCE

[Series 1: x lumbar spine lat · 0.15mm/px · 1 of 1 slices shown (1 of 2)]
[im 1/1]
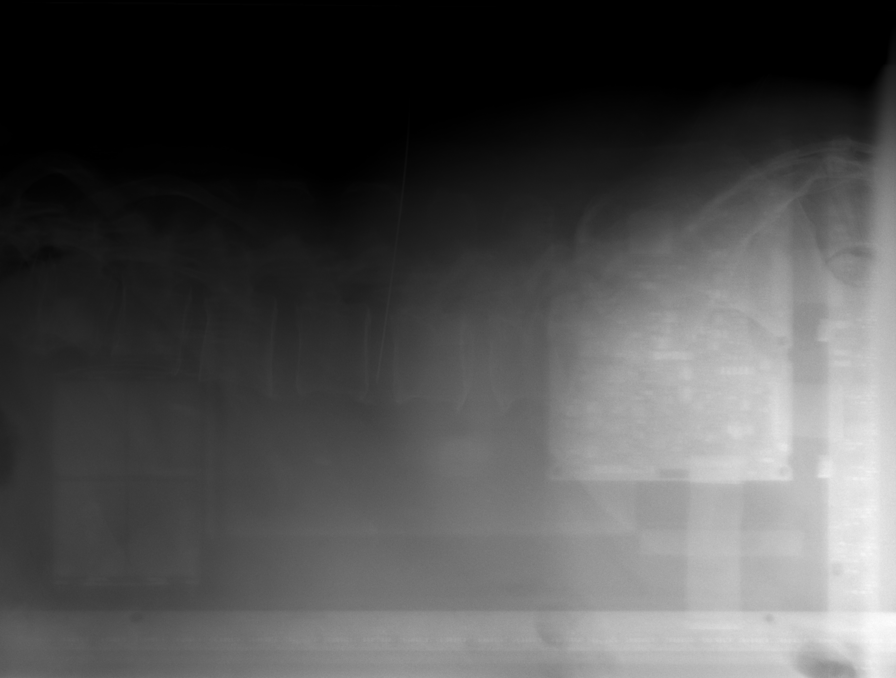

[Series 2: cp_standard · 0.17mm/px · 1 of 1 slices shown]
[im 1/1]
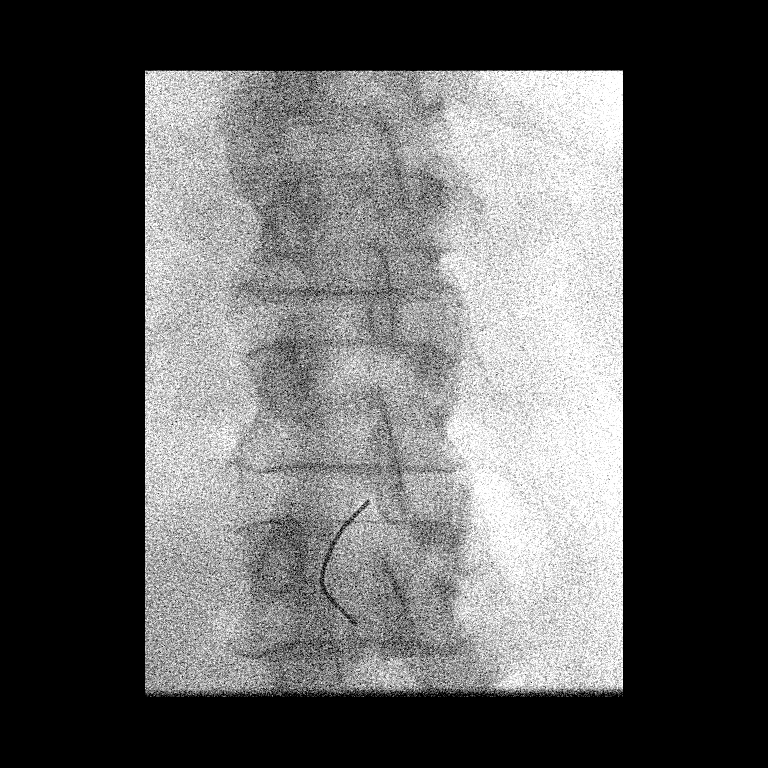

[Series 3: fluoro_iodine 2fps_bw · 0.17mm/px · 2 of 2 frames shown]
[frame 1/2]
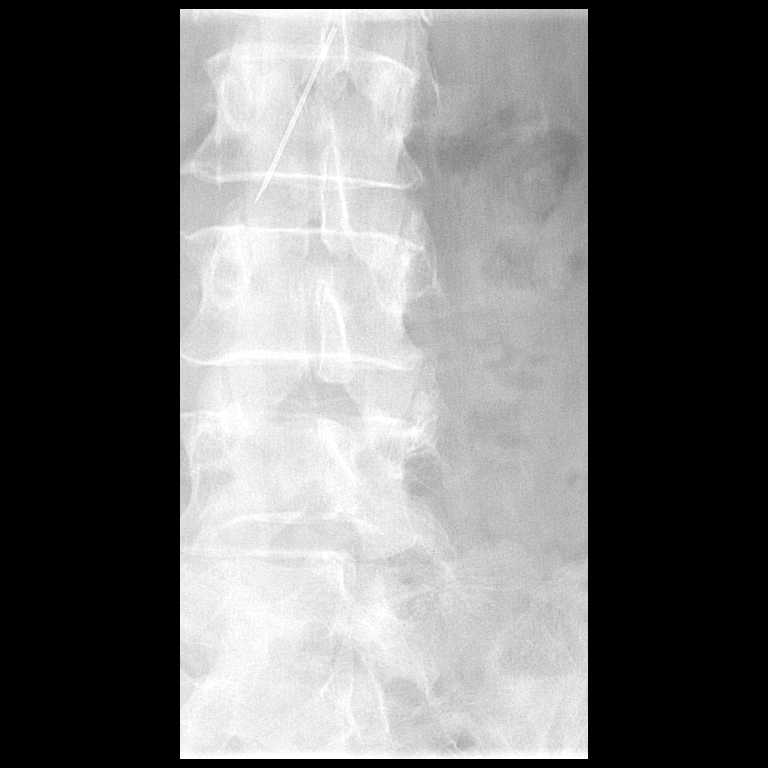
[frame 2/2]
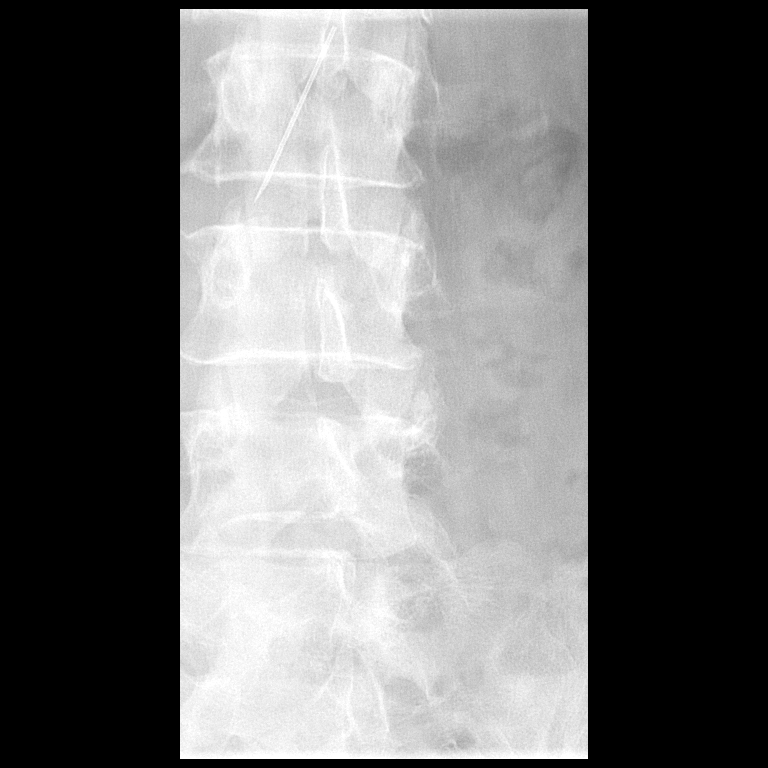

[Series 4: x lumbar spine lat · 0.15mm/px · 1 of 1 slices shown (2 of 2)]
[im 1/1]
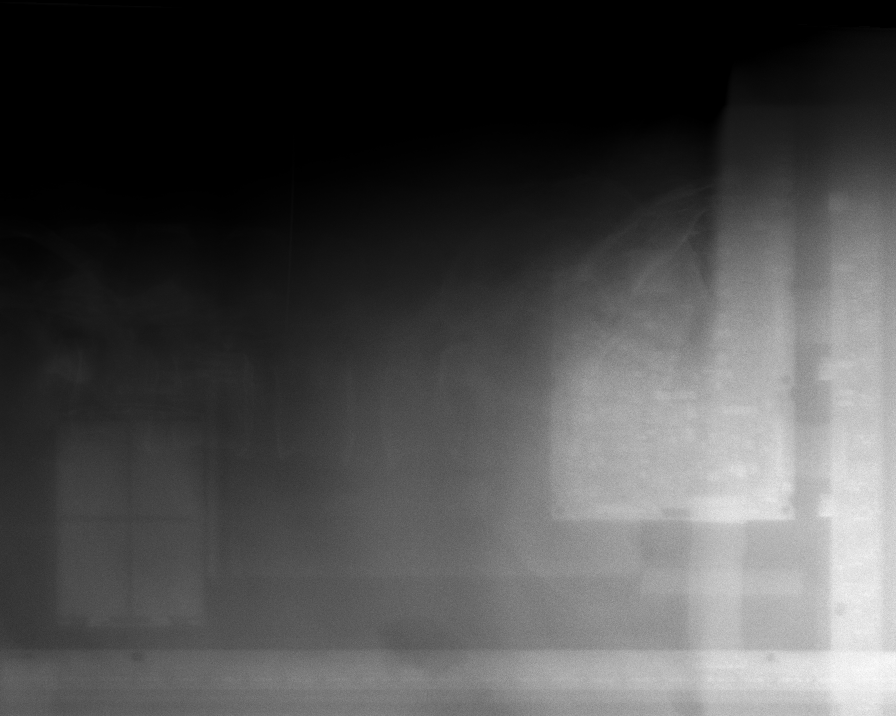

[5 of 5 positions shown; findings below may reference images not displayed]

FLUOROSCOPY TIME:  Fluoroscopy Time:  54 seconds

Radiation Exposure Index (if provided by the fluoroscopic device):
83.7 mGy

Number of Acquired Spot Images: 2

PROCEDURE:
Informed consent was obtained from the patient prior to the
procedure, including potential complications of headache, allergy,
and pain. With the patient prone, the lower back was prepped with
Betadine. 1% Lidocaine was used for local anesthesia. Lumbar
puncture was attempted at the L3-L4 level using a 20 gauge needle.
There was no return of CSF. Needle was advanced into the disc space
and then retracted without return of CSF. A new 20 gauge needle was
then attempted again at the L3-L4 level with slow return clear CSF
with an opening pressure of 8 cm water. 5 ml of CSF were obtained
for laboratory studies. The patient tolerated the procedure well and
there were no apparent complications.
IMPRESSION: Technically successful fluoroscopic guided lumbar puncture.

## 2021-02-27 MED ORDER — ONDANSETRON HCL 4 MG/2ML IJ SOLN: 4.0000 mg | Freq: Four times a day (QID) | INTRAMUSCULAR | Status: DC | PRN

## 2021-02-27 MED ORDER — ACETAMINOPHEN 325 MG PO TABS
650.0000 mg | ORAL_TABLET | Freq: Four times a day (QID) | ORAL | Status: DC | PRN
  Administered 2021-02-27 – 2021-02-28 (×2): 650 mg via ORAL
  Filled 2021-02-27: qty 2

## 2021-02-27 MED ORDER — METFORMIN HCL 850 MG PO TABS: 850.0000 mg | ORAL_TABLET | Freq: Two times a day (BID) | ORAL | Status: DC

## 2021-02-27 MED ORDER — INSULIN ASPART 100 UNIT/ML IJ SOLN
6.0000 [IU] | Freq: Three times a day (TID) | INTRAMUSCULAR | Status: DC
  Administered 2021-02-27 – 2021-03-04 (×17): 6 [IU] via SUBCUTANEOUS

## 2021-02-27 MED ORDER — SEMAGLUTIDE (1 MG/DOSE) 4 MG/3ML ~~LOC~~ SOPN: 1.0000 mg | PEN_INJECTOR | SUBCUTANEOUS | Status: DC

## 2021-02-27 MED ORDER — ONDANSETRON HCL 4 MG PO TABS: 4.0000 mg | ORAL_TABLET | Freq: Four times a day (QID) | ORAL | Status: DC | PRN

## 2021-02-27 MED ORDER — GADOBUTROL 1 MMOL/ML IV SOLN
10.0000 mL | Freq: Once | INTRAVENOUS | Status: AC | PRN
  Administered 2021-02-27: 10 mL via INTRAVENOUS

## 2021-02-27 MED ORDER — METFORMIN HCL 850 MG PO TABS
1700.0000 mg | ORAL_TABLET | Freq: Every day | ORAL | Status: DC
  Administered 2021-02-27 – 2021-03-03 (×5): 1700 mg via ORAL
  Filled 2021-02-27 (×6): qty 2

## 2021-02-27 MED ORDER — ACETAZOLAMIDE 250 MG PO TABS: 500.0000 mg | ORAL_TABLET | Freq: Two times a day (BID) | ORAL | Status: DC

## 2021-02-27 MED ORDER — EZETIMIBE 10 MG PO TABS
10.0000 mg | ORAL_TABLET | Freq: Every day | ORAL | Status: DC
  Administered 2021-02-27 – 2021-03-03 (×5): 10 mg via ORAL
  Filled 2021-02-27 (×5): qty 1

## 2021-02-27 MED ORDER — FENOFIBRATE 160 MG PO TABS
160.0000 mg | ORAL_TABLET | Freq: Every day | ORAL | Status: DC
  Administered 2021-02-27 – 2021-03-04 (×6): 160 mg via ORAL
  Filled 2021-02-27 (×6): qty 1

## 2021-02-27 MED ORDER — FLUOXETINE HCL 20 MG PO CAPS
60.0000 mg | ORAL_CAPSULE | Freq: Every day | ORAL | Status: DC
  Administered 2021-02-27 – 2021-03-04 (×6): 60 mg via ORAL
  Filled 2021-02-27 (×7): qty 3

## 2021-02-27 MED ORDER — INSULIN GLARGINE 100 UNIT/ML ~~LOC~~ SOLN
60.0000 [IU] | Freq: Every day | SUBCUTANEOUS | Status: DC
  Administered 2021-02-27 – 2021-03-03 (×6): 60 [IU] via SUBCUTANEOUS
  Filled 2021-02-27 (×7): qty 0.6

## 2021-02-27 MED ORDER — BENAZEPRIL HCL 20 MG PO TABS
20.0000 mg | ORAL_TABLET | Freq: Every day | ORAL | Status: DC
  Administered 2021-02-27 – 2021-03-04 (×6): 20 mg via ORAL
  Filled 2021-02-27 (×6): qty 1

## 2021-02-27 MED ORDER — INSULIN ASPART 100 UNIT/ML IJ SOLN
0.0000 [IU] | Freq: Three times a day (TID) | INTRAMUSCULAR | Status: DC
  Administered 2021-02-27 – 2021-02-28 (×5): 4 [IU] via SUBCUTANEOUS
  Administered 2021-03-01: 11 [IU] via SUBCUTANEOUS
  Administered 2021-03-01: 7 [IU] via SUBCUTANEOUS
  Administered 2021-03-01: 15 [IU] via SUBCUTANEOUS
  Administered 2021-03-02 (×2): 11 [IU] via SUBCUTANEOUS
  Administered 2021-03-02: 7 [IU] via SUBCUTANEOUS
  Administered 2021-03-03: 3 [IU] via SUBCUTANEOUS
  Administered 2021-03-03: 7 [IU] via SUBCUTANEOUS
  Administered 2021-03-03: 11 [IU] via SUBCUTANEOUS
  Administered 2021-03-04: 3 [IU] via SUBCUTANEOUS
  Administered 2021-03-04: 11 [IU] via SUBCUTANEOUS

## 2021-02-27 MED ORDER — LIDOCAINE HCL (PF) 1 % IJ SOLN
5.0000 mL | Freq: Once | INTRAMUSCULAR | Status: AC
  Administered 2021-02-27: 8 mL via INTRADERMAL

## 2021-02-27 MED ORDER — PANTOPRAZOLE SODIUM 40 MG PO TBEC
40.0000 mg | DELAYED_RELEASE_TABLET | Freq: Every day | ORAL | Status: DC
  Administered 2021-02-27 – 2021-03-04 (×6): 40 mg via ORAL
  Filled 2021-02-27 (×6): qty 1

## 2021-02-27 MED ORDER — METFORMIN HCL 850 MG PO TABS
850.0000 mg | ORAL_TABLET | Freq: Every day | ORAL | Status: DC
  Administered 2021-02-27 – 2021-03-04 (×6): 850 mg via ORAL
  Filled 2021-02-27 (×6): qty 1

## 2021-02-27 MED ORDER — EZETIMIBE-SIMVASTATIN 10-40 MG PO TABS: 1.0000 | ORAL_TABLET | Freq: Every day | ORAL | Status: DC

## 2021-02-27 MED ORDER — SIMVASTATIN 20 MG PO TABS
40.0000 mg | ORAL_TABLET | Freq: Every day | ORAL | Status: DC
  Administered 2021-02-27 – 2021-03-03 (×5): 40 mg via ORAL
  Filled 2021-02-27 (×5): qty 2

## 2021-02-27 MED ORDER — ACETAMINOPHEN 650 MG RE SUPP: 650.0000 mg | Freq: Four times a day (QID) | RECTAL | Status: DC | PRN

## 2021-02-27 NOTE — ED Notes (Signed)
Pt transport to X-ray for LP

## 2021-02-27 NOTE — Consult Note (Signed)
NEUROLOGY CONSULTATION NOTE   Date of service: February 27, 2021 Patient Name: Andrea Marks MRN:  [id number] DOB:  1951-06-14 Reason for consult: "headaches, papilledema and R eye visual disturbance" _ _ _   _ __   _ __ _ _  __ __   _ __   __ _  History of Present Illness  Andrea Marks is a 70 y.o. female with PMH significant for DM2, HTN, HLD, Obesity who presents with R eye temporal visual field blurriness x 4 days.  Symptoms initially noted after she had cataract surgery back in October 2021. 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 Reports felt that her eyes were very sensitive to light after the cataract surgery. Was being followed by optho with regular eye exams and was told that the edema is stable/improving. On Tuesday, 4/26, she woke up and R temporal half of the vision was blurry and this persisted. Saw outpatient neurologist on 4/28 and notified him of the symptoms. Was seen by ophthalmology the same day and noted to have worsening of optic disc edema with significant decrease in visual acuity. Was sent to the ED for further workup.  She reports that 30 years ago, she was told she has swelling in the back of her eyes and was diagnosed with Pseudotumor cerebri. Reports that she had an LP back then and would get dizzy everytime she would get up afterwards. This lasted 4 days an dthen went away and she was back to her baseline.  Workup in the ED with MRI Brain w/o contrast with no acute abnormalities, MRI with contrats demonstrated diffuse pachymeningeal enhancement. MR Venogram head with no sinus thrombosis.   ROS   Constitutional Denies weight loss, fever and chills.   HEENT + changes in vision, no hearing loss.  Respiratory Denies SOB and cough.   CV Denies palpitations and CP   GI Denies abdominal pain, nausea, vomiting and diarrhea.   GU Denies dysuria and urinary frequency.   MSK Denies myalgia and joint pain.   Skin Denies rash and pruritus.   Neurological Mild  headache but no syncope.   Psychiatric Denies recent changes in mood. Denies anxiety and depression.   Past History   Past Medical History:  Diagnosis Date  . Depression   . Diabetes mellitus   . GERD (gastroesophageal reflux disease)   . Hyperlipidemia   . Hypertension   . Obesity    Past Surgical History:  Procedure Laterality Date  . ABDOMINAL HYSTERECTOMY  1991   BSO   Family History  Problem Relation Age of Onset  . Macular degeneration Mother   . Heart disease Mother        syncope  . Aortic stenosis Mother   . Lung disease Father 20       mesothelioma  . Cancer Maternal Aunt        stomach  . Heart disease Paternal Uncle        cabg  . Diabetes Paternal Uncle   . Heart disease Paternal Uncle   . Diabetes Paternal Uncle   . Diabetes Paternal Uncle   . Heart disease Paternal Uncle   . Heart disease Paternal Uncle   . Heart disease Paternal Uncle   . Lung cancer Other        asbestos  . Diabetes Paternal Grandmother   . Cancer Other        lung   Social History   Socioeconomic History  . Marital status: Married  Spouse name: Not on file  . Number of children: 0  . Years of education: 68  . Highest education level: Not on file  Occupational History  . Occupation: retired from [employer], [employer]: RETIRED  Tobacco Use  . Smoking status: Former Smoker    Quit date: 1995    Years since quitting: 27.3  . Smokeless tobacco: Never Used  Substance and Sexual Activity  . Alcohol use: No  . Drug use: No  . Sexual activity: Yes    Partners: Male  Other Topics Concern  . Not on file  Social History Narrative   Lives with husband   Right Handed   Drinks 3-5 cups caffeine daily   Social Determinants of Health   Financial Resource Strain: Not on file  Food Insecurity: Not on file  Transportation Needs: Not on file  Physical Activity: Not on file  Stress: Not on file  Social Connections: Not on file   Allergies  Allergen Reactions  .  Hydrocodone-Acetaminophen Other (See Comments)    unknown  . Oxycodone Hcl Other (See Comments)    unknown  . Penicillins Other (See Comments)    unknown  . Prednisone     Medications  (Not in a hospital admission)    Vitals   Vitals:   02/26/21 2330 02/27/21 0000 02/27/21 0155 02/27/21 0200  BP: (!) 164/68 (!) 145/64 (!) 159/142 (!) 150/65  Pulse: 83 83 82 77  Resp: _0 Temp:      TempSrc:      SpO2: 90% 93% 97% 97%     There is no height or weight on file to calculate BMI.  Physical Exam   General: Laying comfortably in bed; in no acute distress. HENT: Normal oropharynx and mucosa. Normal external appearance of ears and nose.  Neck: Supple, no pain or tenderness CV: No JVD. No peripheral edema.  Pulmonary: Symmetric Chest rise. Normal respiratory effort.  Abdomen: Soft to touch, non-tender.  Ext: No cyanosis, edema, or deformity  Skin: No rash. Normal palpation of skin.   Musculoskeletal: Normal digits and nails by inspection. No clubbing.   Neurologic Examination  Mental status/Cognition: Alert, oriented to self, place, month and year, good attention.  Speech/language: Fluent, comprehension intact, object naming intact, repetition intact.  Cranial nerves:   CN II Pupils equal and reactive to light, R temporal vision blurred   CN III,IV,VI EOM intact, no gaze preference or deviation, no nystagmus   CN V normal sensation in V1, V2, and V3 segments bilaterally   CN VII no asymmetry, no nasolabial fold flattening   CN VIII normal hearing to speech   CN IX & X normal palatal elevation, no uvular deviation   CN XI 5/5 head turn and 5/5 shoulder shrug bilaterally    CN XII midline tongue protrusion   Motor:  Muscle bulk: normal, tone normal, pronator drift none tremor none Mvmt Root Nerve  Muscle Right Left Comments  SA C5/6 Ax Deltoid 5 5   EF C5/6 Mc Biceps 5 5   EE C6/7/8 Rad Triceps 5 5   WF C6/7 Med FCR     WE C7/8 PIN ECU     F Ab C8/T1 U ADM/FDI  5 5   HF L1/2/3 Fem Illopsoas 5 5   KE L2/3/4 Fem Quad 5 5   DF L4/5 D Peron Tib Ant 5 5   PF S1/2 Tibial Grc/Sol 5 5    Reflexes:  Right Left  Comments  Pectoralis      Biceps (C5/6) 2 2   Brachioradialis (C5/6) 2 2    Triceps (C6/7) 2 2    Patellar (L3/4) 2 2    Achilles (S1)      Hoffman      Plantar     Jaw jerk    Sensation:  Light touch Intact throughout   Pin prick    Temperature    Vibration   Proprioception    Coordination/Complex Motor:  - Finger to Nose intact BL - Heel to shin intact BL - Rapid alternating movement are normal - Gait: Deferred.  Labs   CBC:  Recent Labs  Lab 02/26/21 1610  WBC 10.4  NEUTROABS 7.2  HGB 14.2  HCT 46.6*  MCV 88.3  PLT 268    Basic Metabolic Panel:  Lab Results  Component Value Date   NA 136 02/26/2021   K 4.5 02/26/2021   CO2 27 02/26/2021   GLUCOSE 138 (H) 02/26/2021   BUN 21 02/26/2021   CREATININE 1.08 (H) 02/26/2021   CALCIUM 9.0 02/26/2021   GFRNONAA 55 (L) 02/26/2021   GFRAA >60 11/15/2016   Lipid Panel:  Lab Results  Component Value Date   LDLCALC 20 01/01/2021   HgbA1c:  Lab Results  Component Value Date   HGBA1C 6.0 (A) 12/18/2020   Urine Drug Screen: No results found for: LABOPIA, COCAINSCRNUR, LABBENZ, AMPHETMU, THCU, LABBARB  Alcohol Level No results found for: [facility]  MRI Brain w/o C: No evidence of recent infarction, hemorrhage, or mass. Mild chronic microvascular ischemic changes.  MRI Brain with C and MR Venogram head w/wo C: 1. Diffuse smooth pachymeningeal thickening and enhancement overlying both cerebral hemispheres. Finding is of uncertain etiology or significance, and could be related to prior intervention. A possible infectious or inflammatory process would be the primary differential consideration. Correlation with CSF analysis recommended as clinically warranted. 2. Subtle bulging of the optic nerve disc at the posterior right globe, suggesting papilledema. 3. No other  abnormal enhancement within the brain. 4. Normal intracranial MRV. No evidence for dural sinus thrombosis, stenosis, or other abnormality.  Impression   Kelechi Astarita is a 70 y.o. female with PMH significant for DM2, HTN, HLD, Obesity who presents with R eye temporal visual field blurriness x 4 days, found to have worsening optic disc edema. Workup with MRI demonstrated pachymeningeal enhancement with no sinus venous thrombosis. There is also subtle bulging of optic nerve disc at her posterior R globe suggesting papilledema.  Presentation is somewhat odd as pachymeningeal enhancement is usually associated with intracranial hypotension rather than elevated ICPs. However, it can be seen in CNS inflammatory and infectious conditions too, specifically granulomatous disorders like Neurosarcoid and tuberculosis, but also in Rheumatoid arthritis, IgG4-RD, neoplastic, AV fistulas, temporal arteritis.  She does not appear septic and has no meningismus on exam with no fever, no leukocytosis.  Recommendations  - ESR and CRP, IgG4 levels, RF, Soluble IL2R, serum and CSF ACE, RF. - LP with CSF cell count, differential, protein, glucose, OCG, IgG index, CSF cytology, Acid fast smear - IF CSF opening pressure is elevated, recommend starting Acetazolamide 526m IV once, followed by 5063mBID. Weekly Chemistry while on Acetazolamide. - further workup pending above. ______________________________________________________________________   Thank you for the opportunity to take part in the care of this patient. If you have any further questions, please contact the neurology consultation attending.  Signed,  SaLibertyager Number [phone] _ _   _  __   _ __ _ _  __ __   _ __   __ _

## 2021-02-27 NOTE — Plan of Care (Signed)
  Problem: Education: Goal: Knowledge of General Education information will improve Description: Including pain rating scale, medication(s)/side effects and non-pharmacologic comfort measures 02/27/2021 1402 by Collene Gobble, RN Outcome: Progressing 02/27/2021 1402 by Collene Gobble, RN Outcome: Not Progressing   Problem: Health Behavior/Discharge Planning: Goal: Ability to manage health-related needs will improve 02/27/2021 1402 by Collene Gobble, RN Outcome: Progressing 02/27/2021 1402 by Collene Gobble, RN Outcome: Not Progressing   Problem: Clinical Measurements: Goal: Diagnostic test results will improve 02/27/2021 1402 by Collene Gobble, RN Outcome: Progressing 02/27/2021 1402 by Collene Gobble, RN Outcome: Not Progressing

## 2021-02-27 NOTE — Procedures (Signed)
Lumbar Puncture Procedure Note  Andrea Marks  [id number]  1951-05-15  Date:02/27/21  Time:9:47 AM   Provider Performing:Mayo Owczarzak Allena Katz   Procedure: Fluoroscopy guided Lumbar Puncture  Indication(s) Possible IIH  Consent Risks of the procedure as well as the alternatives and risks of each were explained to the patient and/or caregiver.  Consent for the procedure was obtained and is signed in the bedside chart  Anesthesia Topical only with 1% lidocaine    Time Out Verified patient identification, verified procedure, site/side was marked, verified correct patient position, special equipment/implants available, medications/allergies/relevant history reviewed, required imaging and test results available.   Sterile Technique Maximal sterile technique including sterile barrier drape, hand hygiene, sterile gown, sterile gloves, mask, hair covering.    Procedure Description Using fluoroscopy, appropriate location needle placement chosen.   Lidocaine used to anesthetize skin and subcutaneous tissue overlying this area.  A 20g spinal needle was then used to access the subarachnoid space. There was no fluid return. Another 20g spinal needle was placed at the same level with slow return of clear CSF. 5 mL collected. Normal opening pressure.   See report in PACS.  Complications/Tolerance None; patient tolerated the procedure well.   EBL Minimal   Specimen(s) CSF

## 2021-02-27 NOTE — ED Provider Notes (Signed)
  Provider Note MRN:  [id number]  Arrival date & time: 02/27/21    ED Course and Medical Decision Making  Assumed care from Dr. Anitra Lauth at shift change.  Papilledema here for MRI imaging, case discussed with neurology, will touch base again with neuro after MRI to discuss the need for emergent LP.  MRI reveals diffuse meningeal thickening and enhancement.  Discussed findings with neurology, who will come to evaluate the patient.  Given patient's morbid obesity, plan is to obtain fluoroscopy guided LP and admit to hospitalist service.  Patient is nontoxic, afebrile, neurology favoring a noninfectious cause, will hold off on antibiotics at this time.  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 Pilar Plate, MD [facility] [email or web address]    Sabas Sous, MD 02/27/21 [phone]

## 2021-02-27 NOTE — H&P (Signed)
History and Physical    Charlita Brian HXT:[id number] DOB: 1950/12/13 DOA: 02/26/2021  PCP: [name], DO  Patient coming from: Home  I have personally briefly reviewed patient's old medical records in [place]  Chief Complaint: Papilledema  HPI: Briona Korpela is a 70 y.o. female with medical history significant of DM2, HTN, HLD, morbid obesity.  Pt has h/o Pseudotumor cerebri ~30 years ago she thinks.  Not on any chronic meds for this now though it looks like.  Pt presents to ED with c/o headaches, R eye visual disturbance.  Headaches ongoing since cataract surgery in October, though only noticed the vision changes ~3-4 days ago.  Specifically reports blurry vision to temporal portion of R eye.  L eye feels normal.  Saw Dr. Valetta Close earlier this month on 02/06/21, noted at that time to have mild disk edema bilaterally.  Today saw Dr. Jannifer Franklin then Dr. Valetta Close who apparently noted on exam that she had significantly worsening papilledema compared to just a couple of weeks ago.  Also noted significant change in visual acuity in both eyes.  Pt thus sent to ED for further work up.  No temporal artery tenderness, no fevers, no N/V.   ED Course: MRI brain shows: 1) Subtle bulging of the optic nerve disc at the posterior right globe, suggesting papilledema. 2) Diffuse smooth pachymeningeal thickening and enhancement overlying both cerebral hemispheres. Finding is of uncertain etiology or significance, and could be related to prior intervention. A possible infectious or inflammatory process would be the primary differential consideration. Correlation with CSF analysis recommended as clinically warranted  MRV neg.  EDP and neurologist are doubtful that this represents infectious etiology based on lack of fever, meningismus, or other SIRS.  Given body habitus, admission and IR LP is requested.   Review of Systems: As per HPI, otherwise all review of systems  negative.  Past Medical History:  Diagnosis Date  . Depression   . Diabetes mellitus   . GERD (gastroesophageal reflux disease)   . Hyperlipidemia   . Hypertension   . Obesity     Past Surgical History:  Procedure Laterality Date  . ABDOMINAL HYSTERECTOMY  1991   BSO     reports that she quit smoking about 27 years ago. She has never used smokeless tobacco. She reports that she does not drink alcohol and does not use drugs.  Allergies  Allergen Reactions  . Hydrocodone-Acetaminophen Other (See Comments)    unknown  . Oxycodone Hcl Other (See Comments)    unknown  . Penicillins Other (See Comments)    unknown  . Prednisone     Family History  Problem Relation Age of Onset  . Macular degeneration Mother   . Heart disease Mother        syncope  . Aortic stenosis Mother   . Lung disease Father 71       mesothelioma  . Cancer Maternal Aunt        stomach  . Heart disease Paternal Uncle        cabg  . Diabetes Paternal Uncle   . Heart disease Paternal Uncle   . Diabetes Paternal Uncle   . Diabetes Paternal Uncle   . Heart disease Paternal Uncle   . Heart disease Paternal Uncle   . Heart disease Paternal Uncle   . Lung cancer Other        asbestos  . Diabetes Paternal Grandmother   . Cancer Other  lung     Prior to Admission medications   Medication Sig Start Date End Date Taking? Authorizing Provider  acetaminophen (TYLENOL) 650 MG CR tablet Take 650 mg by mouth 2 (two) times daily as needed for pain. For pain   Yes [provider]  aspirin 81 MG tablet Take 81 mg by mouth daily.   Yes [provider]  benazepril (LOTENSIN) 20 MG tablet TAKE 1 TABLET DAILY 02/24/21  Yes Roma Schanz R, DO  estradiol (ESTRACE) 1 MG tablet Take 1 tablet (1 mg total) by mouth daily. 01/01/21  Yes Carollee Herter, Kendrick Fries R, DO  ezetimibe-simvastatin (VYTORIN) 10-40 MG tablet TAKE 1 TABLET AT BEDTIME 02/24/21  Yes Roma Schanz R, DO  fenofibrate 160  MG tablet TAKE 1 TABLET DAILY 02/24/21  Yes Roma Schanz R, DO  FLUoxetine (PROZAC) 20 MG tablet Take 3 tablets (60 mg total) by mouth daily. 01/01/21  Yes Roma Schanz R, DO  insulin aspart (NOVOLOG FLEXPEN) 100 UNIT/ML FlexPen Inject 18-24 Units into the skin 3 (three) times daily with meals. 12/18/20  Yes Philemon Kingdom, MD  Insulin Glargine [facility] KWIKPEN) 100 UNIT/ML Inject 60 Units into the skin at bedtime. 12/18/20  Yes Philemon Kingdom, MD  metFORMIN (GLUCOPHAGE) 850 MG tablet Take 1 tablet in the AM, and 2 tablets in the PM. 12/18/20  Yes Philemon Kingdom, MD  pantoprazole (PROTONIX) 40 MG tablet Take 1 tablet (40 mg total) by mouth daily. Pt due for office visit 12/29/20  Yes Carollee Herter, Kendrick Fries R, DO  Semaglutide, 1 MG/DOSE, (OZEMPIC, 1 MG/DOSE,) 4 MG/3ML SOPN INJECT 1MG SUBCUTANEOUSLY  ONCE A WEEK. 12/18/20  Yes Philemon Kingdom, MD  Blood Glucose Monitoring Suppl (ONE TOUCH ULTRA SYSTEM KIT) W/DEVICE KIT 1 kit by Does not apply route once. 04/06/13   Philemon Kingdom, MD  Continuous Blood Gluc Receiver (FREESTYLE LIBRE 14 DAY READER) DEVI 1 Device by Does not apply route See admin instructions. Use for continuous monitor glucose 11/21/19   Philemon Kingdom, MD  Continuous Blood Gluc Sensor (FREESTYLE LIBRE 14 DAY SENSOR) MISC Apply new sensor every 14 days for continuous glucose monitoring. 03/11/20   Philemon Kingdom, MD  glucose blood (ONETOUCH ULTRA) test strip Use to check blood sugar 3 times a day 11/08/19   Philemon Kingdom, MD  Insulin Pen Needle (PEN NEEDLES) 32G X 4 MM MISC USE AS INSTRUCTED WITH LANCETS. 12/10/20   Philemon Kingdom, MD    Physical Exam: Vitals:   02/27/21 0155 02/27/21 0200 02/27/21 0311 02/27/21 0314  BP: (!) 159/142 (!) 150/65    Pulse: 82 77  80  Resp: 17 18    Temp:    98.9 F (37.2 C)  TempSrc:    Oral  SpO2: 97% 97%    Weight:   124.7 kg   Height:   5' 3" (1.6 m)     Constitutional: NAD, calm, comfortable Eyes: PERRL, lids and  conjunctivae normal ENMT: Mucous membranes are moist. Posterior pharynx clear of any exudate or lesions.Normal dentition.  Neck: normal, supple, no masses, no thyromegaly Respiratory: clear to auscultation bilaterally, no wheezing, no crackles. Normal respiratory effort. No accessory muscle use.  Cardiovascular: Regular rate and rhythm, no murmurs / rubs / gallops. No extremity edema. 2+ pedal pulses. No carotid bruits.  Abdomen: no tenderness, no masses palpated. No hepatosplenomegaly. Bowel sounds positive.  Musculoskeletal: no clubbing / cyanosis. No joint deformity upper and lower extremities. Good ROM, no contractures. Normal muscle tone.  Skin: no rashes, lesions,  ulcers. No induration Neurologic: CN 2-12 grossly intact. Sensation intact, DTR normal. Strength 5/5 in all 4.  Psychiatric: Normal judgment and insight. Alert and oriented x 3. Normal mood.    Labs on Admission: I have personally reviewed following labs and imaging studies  CBC: Recent Labs  Lab 02/26/21 1610  WBC 10.4  NEUTROABS 7.2  HGB 14.2  HCT 46.6*  MCV 88.3  PLT 161   Basic Metabolic Panel: Recent Labs  Lab 02/26/21 1610  NA 136  K 4.5  CL 99  CO2 27  GLUCOSE 138*  BUN 21  CREATININE 1.08*  CALCIUM 9.0   GFR: Estimated Creatinine Clearance: 62.2 mL/min (A) (by C-G formula based on SCr of 1.08 mg/dL (H)). Liver Function Tests: No results for input(s): AST, ALT, ALKPHOS, BILITOT, PROT, ALBUMIN in the last 168 hours. No results for input(s): LIPASE, AMYLASE in the last 168 hours. No results for input(s): AMMONIA in the last 168 hours. Coagulation Profile: No results for input(s): INR, PROTIME in the last 168 hours. Cardiac Enzymes: No results for input(s): CKTOTAL, CKMB, CKMBINDEX, TROPONINI in the last 168 hours. BNP (last 3 results) No results for input(s): PROBNP in the last 8760 hours. HbA1C: No results for input(s): HGBA1C in the last 72 hours. CBG: No results for input(s): GLUCAP in the  last 168 hours. Lipid Profile: No results for input(s): CHOL, HDL, LDLCALC, TRIG, CHOLHDL, LDLDIRECT in the last 72 hours. Thyroid Function Tests: No results for input(s): TSH, T4TOTAL, FREET4, T3FREE, THYROIDAB in the last 72 hours. Anemia Panel: No results for input(s): VITAMINB12, FOLATE, FERRITIN, TIBC, IRON, RETICCTPCT in the last 72 hours. Urine analysis:    Component Value Date/Time   COLORURINE YELLOW 11/15/2016 1732   APPEARANCEUR CLOUDY (A) 11/15/2016 1732   LABSPEC 1.039 (H) 11/15/2016 1732   PHURINE 5.5 11/15/2016 1732   GLUCOSEU >=500 (A) 11/15/2016 1732   HGBUR NEGATIVE 11/15/2016 1732   HGBUR negative 11/23/2010 0908   BILIRUBINUR neg 12/19/2017 1157   KETONESUR 15 (A) 11/15/2016 1732   PROTEINUR neg 12/19/2017 1157   PROTEINUR 30 (A) 11/15/2016 1732   UROBILINOGEN 0.2 12/19/2017 1157   UROBILINOGEN 0.2 11/23/2010 0908   NITRITE neg 12/19/2017 1157   NITRITE NEGATIVE 11/15/2016 1732   LEUKOCYTESUR Negative 12/19/2017 1157    Radiological Exams on Admission: MR Brain Wo Contrast (neuro protocol)  Result Date: 02/26/2021 CLINICAL DATA:  Rapidly progressive optic disc edema EXAM: MRI HEAD WITHOUT CONTRAST TECHNIQUE: Multiplanar, multiecho pulse sequences of the brain and surrounding structures were obtained without intravenous contrast. COMPARISON:  None. FINDINGS: Mild motion artifact is present. Brain: There is no acute infarction or intracranial hemorrhage. There is no intracranial mass, mass effect, or edema. There is no hydrocephalus or extra-axial fluid collection. Prominence of the ventricles and sulci reflects generalized parenchymal volume loss. Patchy foci of T2 hyperintensity in the supratentorial white matter nonspecific but may reflect mild chronic microvascular ischemic changes. Vascular: Major vessel flow voids at the skull base are preserved. Skull and upper cervical spine: Normal marrow signal is preserved. Sinuses/Orbits: Paranasal sinuses are aerated. No  significant orbital abnormality on this nondedicated study. Other: Sella is unremarkable.  Mastoid air cells are clear. IMPRESSION: No evidence of recent infarction, hemorrhage, or mass. Mild chronic microvascular ischemic changes. Electronically Signed   By: Macy Mis M.D.   On: 02/26/2021 18:36   MR BRAIN W CONTRAST  Result Date: 02/27/2021 CLINICAL DATA:  Initial evaluation for acute visual loss. EXAM: MRI HEAD WITH CONTRAST MRV HEAD WITHOUT  AND WITH CONTRAST TECHNIQUE: Multiplanar, multiecho pulse sequences of the brain and surrounding structures were obtained with intravenous contrast. Angiographic images of the intracranial venous structures were obtained using MRV technique without intravenous contrast. COMPARISON:  Prior noncontrast MRI from earlier the same day. CONTRAST:  67m GADAVIST GADOBUTROL 1 MMOL/ML IV SOLN FINDINGS: MR HEAD FINDINGS Brain: Postcontrast only images of the brain were performed. Sequences demonstrate diffuse smooth pachymeningeal enhancement, fairly symmetric in nature overlying both cerebral hemispheres. This was likely present on prior noncontrast brain MRI from earlier the same day. Otherwise, no other abnormal enhancement seen within the brain. No mass lesion, mass effect or midline shift. No hydrocephalus or extra-axial fluid collection. Pituitary gland and suprasellar region normal. Midline structures intact and normal. No other findings to suggest intracranial hypotension. Vascular: Normal intravascular enhancement seen throughout the brain. Skull and upper cervical spine: Craniocervical junction within normal limits. Bone marrow signal grossly within normal limits. No scalp soft tissue abnormality. Sinuses/Orbits: Subtle bulging of the optic nerve disc at the posterior right globe, suggesting papilledema (series 15, image 16). No appreciable bulging of the optic disc at the left globe. Prior bilateral ocular lens replacement. Globes and orbital soft tissues  demonstrate no other acute finding. Mild scattered mucosal thickening noted within the ethmoidal air cells. Paranasal sinuses are otherwise clear. No mastoid effusion. Inner ear structures grossly normal. Other: None. MRV HEAD FINDINGS Normal flow related signal and enhancement seen throughout the superior sagittal sinus to the torcula. Torcula itself appears patent. Transverse and sigmoid sinuses are patent as are the visualized proximal internal jugular veins. Right transverse sinus dominant, with a diffusely hypoplastic left transverse and sigmoid sinus. Straight sinus, vein of Galen, internal cerebral veins, and basal veins of Rosenthal appear patent. No evidence for dural sinus thrombosis. No appreciable dural sinus stenosis. No findings to suggest cavernous sinus thrombosis. IMPRESSION: 1. Diffuse smooth pachymeningeal thickening and enhancement overlying both cerebral hemispheres. Finding is of uncertain etiology or significance, and could be related to prior intervention. A possible infectious or inflammatory process would be the primary differential consideration. Correlation with CSF analysis recommended as clinically warranted. 2. Subtle bulging of the optic nerve disc at the posterior right globe, suggesting papilledema. 3. No other abnormal enhancement within the brain. 4. Normal intracranial MRV. No evidence for dural sinus thrombosis, stenosis, or other abnormality. Electronically Signed   By: BJeannine BogaM.D.   On: 02/27/2021 02:23   MR MRV HEAD W WO CONTRAST  Result Date: 02/27/2021 CLINICAL DATA:  Initial evaluation for acute visual loss. EXAM: MRI HEAD WITH CONTRAST MRV HEAD WITHOUT AND WITH CONTRAST TECHNIQUE: Multiplanar, multiecho pulse sequences of the brain and surrounding structures were obtained with intravenous contrast. Angiographic images of the intracranial venous structures were obtained using MRV technique without intravenous contrast. COMPARISON:  Prior noncontrast MRI  from earlier the same day. CONTRAST:  133mGADAVIST GADOBUTROL 1 MMOL/ML IV SOLN FINDINGS: MR HEAD FINDINGS Brain: Postcontrast only images of the brain were performed. Sequences demonstrate diffuse smooth pachymeningeal enhancement, fairly symmetric in nature overlying both cerebral hemispheres. This was likely present on prior noncontrast brain MRI from earlier the same day. Otherwise, no other abnormal enhancement seen within the brain. No mass lesion, mass effect or midline shift. No hydrocephalus or extra-axial fluid collection. Pituitary gland and suprasellar region normal. Midline structures intact and normal. No other findings to suggest intracranial hypotension. Vascular: Normal intravascular enhancement seen throughout the brain. Skull and upper cervical spine: Craniocervical junction within normal limits. Bone marrow  signal grossly within normal limits. No scalp soft tissue abnormality. Sinuses/Orbits: Subtle bulging of the optic nerve disc at the posterior right globe, suggesting papilledema (series 15, image 16). No appreciable bulging of the optic disc at the left globe. Prior bilateral ocular lens replacement. Globes and orbital soft tissues demonstrate no other acute finding. Mild scattered mucosal thickening noted within the ethmoidal air cells. Paranasal sinuses are otherwise clear. No mastoid effusion. Inner ear structures grossly normal. Other: None. MRV HEAD FINDINGS Normal flow related signal and enhancement seen throughout the superior sagittal sinus to the torcula. Torcula itself appears patent. Transverse and sigmoid sinuses are patent as are the visualized proximal internal jugular veins. Right transverse sinus dominant, with a diffusely hypoplastic left transverse and sigmoid sinus. Straight sinus, vein of Galen, internal cerebral veins, and basal veins of Rosenthal appear patent. No evidence for dural sinus thrombosis. No appreciable dural sinus stenosis. No findings to suggest cavernous  sinus thrombosis. IMPRESSION: 1. Diffuse smooth pachymeningeal thickening and enhancement overlying both cerebral hemispheres. Finding is of uncertain etiology or significance, and could be related to prior intervention. A possible infectious or inflammatory process would be the primary differential consideration. Correlation with CSF analysis recommended as clinically warranted. 2. Subtle bulging of the optic nerve disc at the posterior right globe, suggesting papilledema. 3. No other abnormal enhancement within the brain. 4. Normal intracranial MRV. No evidence for dural sinus thrombosis, stenosis, or other abnormality. Electronically Signed   By: Jeannine Boga M.D.   On: 02/27/2021 02:23    EKG: Independently reviewed.  Assessment/Plan Principal Problem:   Papilledema Active Problems:   Essential hypertension   Type 2 diabetes mellitus with hyperglycemia, with long-term current use of insulin (HCC)   Morbid obesity ([place])    1. Papilledema - 1. ? Recurrent IIH given h/o same x30 years ago? 2. Next step in work up at this point is LP 3. Plan is to admit pt for IR to perform LP in AM. 4. Doubt infectious etiology, holding off on ABx 2. DM2 - 1. Cont home Lantus, metformin, ozempic 2. Novolog 6u TID AC and resistant scale SSI (hold home novolog for the moment). 3. HTN - 1. Cont home BP meds  DVT prophylaxis: SCDs Code Status: Full Family Communication: No family in room Disposition Plan: Home after diagnosis and treatment of papilledema Consults called: Neurology Admission status: Place in 54    [place], [place] Hospitalists  How to contact the [facility] Attending or Consulting provider [place] or covering provider during after hours [place], for this patient?  1. Check the care team in [facility] and look for a) attending/consulting [facility] provider listed and b) the [facility] team listed 2. Log into [email or web address]  Amion Physician Scheduling and messaging for groups and whole  hospitals  On call and physician scheduling software for group practices, residents, hospitalists and other medical providers for call, clinic, rotation and shift schedules. [employer] is a hospital-wide system for scheduling doctors and paging doctors on call. EasyPlot is for scientific plotting and data analysis.  [email or web address]  and use [place]'s universal password to access. If you do not have the password, please contact the hospital operator.  3. Locate the [facility] provider you are looking for under [facility] and page to a number that you can be directly reached. 4. If you still have difficulty reaching the provider, please page the [facility] (Director on Call) for the Hospitalists listed on amion for assistance.  02/27/2021, 3:27 AM

## 2021-02-27 NOTE — ED Notes (Signed)
Returned from MRI 

## 2021-02-27 NOTE — Progress Notes (Signed)
[facility] PROGRESS NOTE   Andrea Marks ZOX:[id number] DOB: 06-13-1951 DOA: 02/26/2021  PCP: Donato Schultz, DO  Brief History/Interval Summary: 70 y.o. female with medical history significant of DM2, HTN, HLD, morbid obesity. Pt has h/o Pseudotumor cerebri ~30 years ago she thinks.  Patient went to her neurologist and complained of headaches and right eye visual disturbances ongoing for a few days.  There was concern for papilledema.  She was sent over to her ophthalmologist who was concerned by the ophthalmological examination and sent the patient to the emergency department.  She was seen by neurology.  Plan is for lumbar puncture to make a diagnosis.  Concern is for pseudotumor cerebri.  Consultants: Neurology  Procedures: Lumbar puncture has been ordered  Antibiotics: Anti-infectives (From admission, onward)   None      Subjective/Interval History: Patient denies any headaches at this time.  Light does bother her occasionally.  Continues to have blurry vision from her right eye.  Denies any nausea vomiting.  No weakness in any 1 side.    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  MRI was done in the emergency department.  Diffuse moderate pachymeningeal thickening and enhancement was noted.  There was also suggestion of papilledema in the right eye.  Neurology was consulted.  LP has been ordered to be done by radiology.  CSF studies have been ordered by neurology.  Defer management to neurology at this time.  Diabetes mellitus type 2 Reports that her blood glucose levels and A1c has been very well controlled with her last A1c being around 5.9.  She is on Lantus and NovoLog at home along with Ozempic.  Also appears to be on metformin.  These are being continued.  Check CBGs.  Essential hypertension Continue benazepril.  Monitor blood  pressures.  Severe obesity Estimated body mass index is 48.71 kg/m as calculated from the following:   Height as of this encounter: 5\' 3"  (1.6 m).   Weight as of this encounter: 124.7 kg.    DVT Prophylaxis: No chemical prophylaxis due to need for LP. Code Status: Full code Family Communication: Discussed with patient.  No family at bedside Disposition Plan: Hopefully return home when improved  Status is: Observation  The patient will require care spanning > 2 midnights and should be moved to inpatient because: Ongoing diagnostic testing needed not appropriate for outpatient work up, IV treatments appropriate due to intensity of illness or inability to take PO and Inpatient level of care appropriate due to severity of illness  Dispo: The patient is from: Home              Anticipated d/c is to: Home              Patient currently is not medically stable to d/c.   Difficult to place patient No      Medications:  Scheduled: . benazepril  20 mg Oral Daily  . ezetimibe  10 mg Oral QHS   And  . simvastatin  40 mg Oral QHS  . fenofibrate  160 mg Oral Daily  . FLUoxetine  60 mg Oral Daily  . insulin aspart  0-20 Units Subcutaneous TID WC  . insulin aspart  6 Units Subcutaneous TID WC  . insulin glargine  60 Units Subcutaneous QHS  . ipratropium-albuterol  3 mL Nebulization Q6H  . metFORMIN  1,700 mg Oral Q  supper  . metFORMIN  850 mg Oral Q breakfast  . pantoprazole  40 mg Oral Daily  . [START ON 03/01/2021] Semaglutide (1 MG/DOSE)  1 mg Subcutaneous Q Sun   Continuous:  [id number] **OR** acetaminophen, LORazepam, ondansetron **OR** ondansetron (ZOFRAN) IV   Objective:  Vital Signs  Vitals:   02/27/21 0314 02/27/21 0328 02/27/21 0600 02/27/21 0700  BP:  100/67 (!) 153/66 (!) 150/68  Pulse: 80 77 78 74  Resp:  18 (!) 21 17  Temp: 98.9 F (37.2 C) 98.9 F (37.2 C)    TempSrc: Oral Oral    SpO2:  93% 92% 93%  Weight:      Height:        Intake/Output  Summary (Last 24 hours) at 02/27/2021 0934 Last data filed at 02/27/2021 0310 Gross per 24 hour  Intake 0 ml  Output 0 ml  Net 0 ml   Filed Weights   02/27/21 0311  Weight: 124.7 kg    General appearance: Awake alert.  In no distress Resp: Clear to auscultation bilaterally.  Normal effort Cardio: S1-S2 is normal regular.  No S3-S4.  No rubs murmurs or bruit GI: Abdomen is soft.  Nontender nondistended.  Bowel sounds are present normal.  No masses organomegaly Extremities: No edema.  Full range of motion of lower extremities. Neurologic: Alert and oriented x3.  No focal neurological deficits.    Lab Results:  Data Reviewed: I have personally reviewed following labs and imaging studies  CBC: Recent Labs  Lab 02/26/21 1610  WBC 10.4  NEUTROABS 7.2  HGB 14.2  HCT 46.6*  MCV 88.3  PLT 272    Basic Metabolic Panel: Recent Labs  Lab 02/26/21 1610  NA 136  K 4.5  CL 99  CO2 27  GLUCOSE 138*  BUN 21  CREATININE 1.08*  CALCIUM 9.0    GFR: Estimated Creatinine Clearance: 62.2 mL/min (A) (by C-G formula based on SCr of 1.08 mg/dL (H)).   HbA1C: Recent Labs    02/26/21 1610  HGBA1C 5.9*    CBG: Recent Labs  Lab 02/27/21 0336 02/27/21 0750  GLUCAP 152* 172*     Recent Results (from the past 240 hour(s))  Resp Panel by RT-PCR (Flu A&B, Covid) Nasopharyngeal Swab     Status: None   Collection Time: 02/27/21  3:13 AM   Specimen: Nasopharyngeal Swab; Nasopharyngeal(NP) swabs in vial transport medium  Result Value Ref Range Status   SARS Coronavirus 2 by RT PCR NEGATIVE NEGATIVE Final    Comment: (NOTE) SARS-CoV-2 target nucleic acids are NOT DETECTED.  The SARS-CoV-2 RNA is generally detectable in upper respiratory specimens during the acute phase of infection. The lowest concentration of SARS-CoV-2 viral copies this assay can detect is 138 copies/mL. A negative result does not preclude SARS-Cov-2 infection and should not be used as the sole basis for  treatment or other patient management decisions. A negative result may occur with  improper specimen collection/handling, submission of specimen other than nasopharyngeal swab, presence of viral mutation(s) within the areas targeted by this assay, and inadequate number of viral copies(<138 copies/mL). A negative result must be combined with clinical observations, patient history, and epidemiological information. The expected result is Negative.  Fact Sheet for Patients:  BloggerCourse.com  Fact Sheet for Healthcare Providers:  SeriousBroker.it  This test is no t yet approved or cleared by the [place] FDA and  has been authorized for detection and/or diagnosis of SARS-CoV-2 by FDA under an Emergency Use Authorization (EUA). This  EUA will remain  in effect (meaning this test can be used) for the duration of the COVID-19 declaration under Section 564(b)(1) of the Act, 21 U.S.C.section 360bbb-3(b)(1), unless the authorization is terminated  or revoked sooner.       Influenza A by PCR NEGATIVE NEGATIVE Final   Influenza B by PCR NEGATIVE NEGATIVE Final    Comment: (NOTE) The Xpert Xpress SARS-CoV-2/FLU/RSV plus assay is intended as an aid in the diagnosis of influenza from Nasopharyngeal swab specimens and should not be used as a sole basis for treatment. Nasal washings and aspirates are unacceptable for Xpert Xpress SARS-CoV-2/FLU/RSV testing.  Fact Sheet for Patients: BloggerCourse.com  Fact Sheet for Healthcare Providers: SeriousBroker.it  This test is not yet approved or cleared by the [place] FDA and has been authorized for detection and/or diagnosis of SARS-CoV-2 by FDA under an Emergency Use Authorization (EUA). This EUA will remain in effect (meaning this test can be used) for the duration of the COVID-19 declaration under Section 564(b)(1) of the Act, 21  U.S.C. section 360bbb-3(b)(1), unless the authorization is terminated or revoked.  Performed at [facility], [address] [place], [place] [address]       Radiology Studies: MR Brain Wo Contrast (neuro protocol)  Result Date: 02/26/2021 CLINICAL DATA:  Rapidly progressive optic disc edema EXAM: MRI HEAD WITHOUT CONTRAST TECHNIQUE: Multiplanar, multiecho pulse sequences of the brain and surrounding structures were obtained without intravenous contrast. COMPARISON:  None. FINDINGS: Mild motion artifact is present. Brain: There is no acute infarction or intracranial hemorrhage. There is no intracranial mass, mass effect, or edema. There is no hydrocephalus or extra-axial fluid collection. Prominence of the ventricles and sulci reflects generalized parenchymal volume loss. Patchy foci of T2 hyperintensity in the supratentorial white matter nonspecific but may reflect mild chronic microvascular ischemic changes. Vascular: Major vessel flow voids at the skull base are preserved. Skull and upper cervical spine: Normal marrow signal is preserved. Sinuses/Orbits: Paranasal sinuses are aerated. No significant orbital abnormality on this nondedicated study. Other: Sella is unremarkable.  Mastoid air cells are clear. IMPRESSION: No evidence of recent infarction, hemorrhage, or mass. Mild chronic microvascular ischemic changes. Electronically Signed   By: Guadlupe Spanish M.D.   On: 02/26/2021 18:36   MR BRAIN W CONTRAST  Result Date: 02/27/2021 CLINICAL DATA:  Initial evaluation for acute visual loss. EXAM: MRI HEAD WITH CONTRAST MRV HEAD WITHOUT AND WITH CONTRAST TECHNIQUE: Multiplanar, multiecho pulse sequences of the brain and surrounding structures were obtained with intravenous contrast. Angiographic images of the intracranial venous structures were obtained using MRV technique without intravenous contrast. COMPARISON:  Prior noncontrast MRI from earlier the same day. CONTRAST:  85mL GADAVIST  GADOBUTROL 1 MMOL/ML IV SOLN FINDINGS: MR HEAD FINDINGS Brain: Postcontrast only images of the brain were performed. Sequences demonstrate diffuse smooth pachymeningeal enhancement, fairly symmetric in nature overlying both cerebral hemispheres. This was likely present on prior noncontrast brain MRI from earlier the same day. Otherwise, no other abnormal enhancement seen within the brain. No mass lesion, mass effect or midline shift. No hydrocephalus or extra-axial fluid collection. Pituitary gland and suprasellar region normal. Midline structures intact and normal. No other findings to suggest intracranial hypotension. Vascular: Normal intravascular enhancement seen throughout the brain. Skull and upper cervical spine: Craniocervical junction within normal limits. Bone marrow signal grossly within normal limits. No scalp soft tissue abnormality. Sinuses/Orbits: Subtle bulging of the optic nerve disc at the posterior right globe, suggesting papilledema (series 15, image 16). No appreciable  bulging of the optic disc at the left globe. Prior bilateral ocular lens replacement. Globes and orbital soft tissues demonstrate no other acute finding. Mild scattered mucosal thickening noted within the ethmoidal air cells. Paranasal sinuses are otherwise clear. No mastoid effusion. Inner ear structures grossly normal. Other: None. MRV HEAD FINDINGS Normal flow related signal and enhancement seen throughout the superior sagittal sinus to the torcula. Torcula itself appears patent. Transverse and sigmoid sinuses are patent as are the visualized proximal internal jugular veins. Right transverse sinus dominant, with a diffusely hypoplastic left transverse and sigmoid sinus. Straight sinus, vein of Galen, internal cerebral veins, and basal veins of Rosenthal appear patent. No evidence for dural sinus thrombosis. No appreciable dural sinus stenosis. No findings to suggest cavernous sinus thrombosis. IMPRESSION: 1. Diffuse smooth  pachymeningeal thickening and enhancement overlying both cerebral hemispheres. Finding is of uncertain etiology or significance, and could be related to prior intervention. A possible infectious or inflammatory process would be the primary differential consideration. Correlation with CSF analysis recommended as clinically warranted. 2. Subtle bulging of the optic nerve disc at the posterior right globe, suggesting papilledema. 3. No other abnormal enhancement within the brain. 4. Normal intracranial MRV. No evidence for dural sinus thrombosis, stenosis, or other abnormality. Electronically Signed   By: Rise MuBenjamin  McClintock M.D.   On: 02/27/2021 02:23   MR MRV HEAD W WO CONTRAST  Result Date: 02/27/2021 CLINICAL DATA:  Initial evaluation for acute visual loss. EXAM: MRI HEAD WITH CONTRAST MRV HEAD WITHOUT AND WITH CONTRAST TECHNIQUE: Multiplanar, multiecho pulse sequences of the brain and surrounding structures were obtained with intravenous contrast. Angiographic images of the intracranial venous structures were obtained using MRV technique without intravenous contrast. COMPARISON:  Prior noncontrast MRI from earlier the same day. CONTRAST:  10mL GADAVIST GADOBUTROL 1 MMOL/ML IV SOLN FINDINGS: MR HEAD FINDINGS Brain: Postcontrast only images of the brain were performed. Sequences demonstrate diffuse smooth pachymeningeal enhancement, fairly symmetric in nature overlying both cerebral hemispheres. This was likely present on prior noncontrast brain MRI from earlier the same day. Otherwise, no other abnormal enhancement seen within the brain. No mass lesion, mass effect or midline shift. No hydrocephalus or extra-axial fluid collection. Pituitary gland and suprasellar region normal. Midline structures intact and normal. No other findings to suggest intracranial hypotension. Vascular: Normal intravascular enhancement seen throughout the brain. Skull and upper cervical spine: Craniocervical junction within normal  limits. Bone marrow signal grossly within normal limits. No scalp soft tissue abnormality. Sinuses/Orbits: Subtle bulging of the optic nerve disc at the posterior right globe, suggesting papilledema (series 15, image 16). No appreciable bulging of the optic disc at the left globe. Prior bilateral ocular lens replacement. Globes and orbital soft tissues demonstrate no other acute finding. Mild scattered mucosal thickening noted within the ethmoidal air cells. Paranasal sinuses are otherwise clear. No mastoid effusion. Inner ear structures grossly normal. Other: None. MRV HEAD FINDINGS Normal flow related signal and enhancement seen throughout the superior sagittal sinus to the torcula. Torcula itself appears patent. Transverse and sigmoid sinuses are patent as are the visualized proximal internal jugular veins. Right transverse sinus dominant, with a diffusely hypoplastic left transverse and sigmoid sinus. Straight sinus, vein of Galen, internal cerebral veins, and basal veins of Rosenthal appear patent. No evidence for dural sinus thrombosis. No appreciable dural sinus stenosis. No findings to suggest cavernous sinus thrombosis. IMPRESSION: 1. Diffuse smooth pachymeningeal thickening and enhancement overlying both cerebral hemispheres. Finding is of uncertain etiology or significance, and could be related to  prior intervention. A possible infectious or inflammatory process would be the primary differential consideration. Correlation with CSF analysis recommended as clinically warranted. 2. Subtle bulging of the optic nerve disc at the posterior right globe, suggesting papilledema. 3. No other abnormal enhancement within the brain. 4. Normal intracranial MRV. No evidence for dural sinus thrombosis, stenosis, or other abnormality. Electronically Signed   By: Rise Mu M.D.   On: 02/27/2021 02:23       LOS: 0 days   Andrea Marks Rito Ehrlich  [facility] Pager on [email or web address]  02/27/2021, 9:34  AM

## 2021-02-27 NOTE — Progress Notes (Deleted)
Neurology Update  Patient reports R temporal headache with R temporal visual field deficit, pachymeningeal enhancement on MRI, acute exacerbation of chronic R papilledema. LP w/ opening pressure WNL and no pleiocytosis. Will reach out to ophtho in AM to discuss temporal artery biopsy for possible GCA.  Bing Neighbors, MD [facility] [phone]  If 7pm- 7am, please page neurology on call as listed in AMION.

## 2021-02-28 DIAGNOSIS — Z20822 Contact with and (suspected) exposure to covid-19: Secondary | ICD-10-CM | POA: Diagnosis present

## 2021-02-28 DIAGNOSIS — Z87891 Personal history of nicotine dependence: Secondary | ICD-10-CM | POA: Diagnosis not present

## 2021-02-28 DIAGNOSIS — E785 Hyperlipidemia, unspecified: Secondary | ICD-10-CM | POA: Diagnosis present

## 2021-02-28 DIAGNOSIS — Z880 Allergy status to penicillin: Secondary | ICD-10-CM | POA: Diagnosis not present

## 2021-02-28 DIAGNOSIS — Z90710 Acquired absence of both cervix and uterus: Secondary | ICD-10-CM | POA: Diagnosis not present

## 2021-02-28 DIAGNOSIS — F32A Depression, unspecified: Secondary | ICD-10-CM | POA: Diagnosis present

## 2021-02-28 DIAGNOSIS — E1165 Type 2 diabetes mellitus with hyperglycemia: Secondary | ICD-10-CM | POA: Diagnosis present

## 2021-02-28 DIAGNOSIS — Z833 Family history of diabetes mellitus: Secondary | ICD-10-CM | POA: Diagnosis not present

## 2021-02-28 DIAGNOSIS — I10 Essential (primary) hypertension: Secondary | ICD-10-CM | POA: Diagnosis present

## 2021-02-28 DIAGNOSIS — Z6841 Body Mass Index (BMI) 40.0 and over, adult: Secondary | ICD-10-CM | POA: Diagnosis not present

## 2021-02-28 DIAGNOSIS — Z7982 Long term (current) use of aspirin: Secondary | ICD-10-CM | POA: Diagnosis not present

## 2021-02-28 DIAGNOSIS — Z79890 Hormone replacement therapy (postmenopausal): Secondary | ICD-10-CM | POA: Diagnosis not present

## 2021-02-28 DIAGNOSIS — Z79899 Other long term (current) drug therapy: Secondary | ICD-10-CM | POA: Diagnosis not present

## 2021-02-28 DIAGNOSIS — R519 Headache, unspecified: Secondary | ICD-10-CM | POA: Diagnosis not present

## 2021-02-28 DIAGNOSIS — H5461 Unqualified visual loss, right eye, normal vision left eye: Secondary | ICD-10-CM | POA: Diagnosis present

## 2021-02-28 DIAGNOSIS — Z888 Allergy status to other drugs, medicaments and biological substances status: Secondary | ICD-10-CM | POA: Diagnosis not present

## 2021-02-28 DIAGNOSIS — Z7984 Long term (current) use of oral hypoglycemic drugs: Secondary | ICD-10-CM | POA: Diagnosis not present

## 2021-02-28 DIAGNOSIS — Z885 Allergy status to narcotic agent status: Secondary | ICD-10-CM | POA: Diagnosis not present

## 2021-02-28 DIAGNOSIS — K219 Gastro-esophageal reflux disease without esophagitis: Secondary | ICD-10-CM | POA: Diagnosis present

## 2021-02-28 DIAGNOSIS — R062 Wheezing: Secondary | ICD-10-CM | POA: Diagnosis present

## 2021-02-28 DIAGNOSIS — H5711 Ocular pain, right eye: Secondary | ICD-10-CM | POA: Diagnosis present

## 2021-02-28 DIAGNOSIS — E1169 Type 2 diabetes mellitus with other specified complication: Secondary | ICD-10-CM | POA: Diagnosis present

## 2021-02-28 DIAGNOSIS — Z794 Long term (current) use of insulin: Secondary | ICD-10-CM | POA: Diagnosis not present

## 2021-02-28 DIAGNOSIS — H538 Other visual disturbances: Secondary | ICD-10-CM | POA: Diagnosis present

## 2021-02-28 DIAGNOSIS — H4710 Unspecified papilledema: Secondary | ICD-10-CM | POA: Diagnosis present

## 2021-02-28 LAB — GLUCOSE, CAPILLARY
Glucose-Capillary: 179 mg/dL — ABNORMAL HIGH (ref 70–99)
Glucose-Capillary: 182 mg/dL — ABNORMAL HIGH (ref 70–99)
Glucose-Capillary: 120 mg/dL — ABNORMAL HIGH (ref 70–99)
Glucose-Capillary: 174 mg/dL — ABNORMAL HIGH (ref 70–99)

## 2021-02-28 LAB — BASIC METABOLIC PANEL WITH GFR
Anion gap: 9 (ref 5–15)
BUN: 20 mg/dL (ref 8–23)
CO2: 29 mmol/L (ref 22–32)
Calcium: 9 mg/dL (ref 8.9–10.3)
Chloride: 103 mmol/L (ref 98–111)
Creatinine, Ser: 1.04 mg/dL — ABNORMAL HIGH (ref 0.44–1.00)
GFR, Estimated: 58 mL/min — ABNORMAL LOW
Glucose, Bld: 142 mg/dL — ABNORMAL HIGH (ref 70–99)
Potassium: 4.6 mmol/L (ref 3.5–5.1)
Sodium: 141 mmol/L (ref 135–145)

## 2021-02-28 LAB — RHEUMATOID FACTOR: Rheumatoid fact SerPl-aCnc: <10 IU/mL (ref <14.0)

## 2021-02-28 LAB — CBC
HCT: 39.2 % (ref 36.0–46.0)
Hemoglobin: 12.2 g/dL (ref 12.0–15.0)
MCH: 27.1 pg (ref 26.0–34.0)
MCHC: 31.1 g/dL (ref 30.0–36.0)
MCV: 86.9 fL (ref 80.0–100.0)
Platelets: 195 10*3/uL (ref 150–400)
RBC: 4.51 MIL/uL (ref 3.87–5.11)
RDW: 14.6 % (ref 11.5–15.5)
WBC: 7.2 10*3/uL (ref 4.0–10.5)
nRBC: 0 % (ref 0.0–0.2)

## 2021-02-28 LAB — SEDIMENTATION RATE: Sed Rate: 13 mm/h (ref 0–22)

## 2021-02-28 LAB — C-REACTIVE PROTEIN: CRP: 0.7 mg/dL (ref <1.0)

## 2021-02-28 LAB — ANGIOTENSIN CONVERTING ENZYME: Angiotensin-Converting Enzyme: <5 U/L — ABNORMAL LOW (ref 14–82)

## 2021-02-28 LAB — IGG 4: IgG, Subclass 4: 1 mg/dL — ABNORMAL LOW (ref 2–96)

## 2021-02-28 MED ORDER — SODIUM CHLORIDE 0.9 % IV SOLN
500.0000 mg | INTRAVENOUS | Status: AC
  Administered 2021-02-28 – 2021-03-02 (×3): 500 mg via INTRAVENOUS
  Filled 2021-02-28 (×4): qty 4

## 2021-02-28 MED ORDER — ENOXAPARIN SODIUM 40 MG/0.4ML IJ SOSY
40.0000 mg | PREFILLED_SYRINGE | INTRAMUSCULAR | Status: DC
  Administered 2021-03-01 – 2021-03-04 (×4): 40 mg via SUBCUTANEOUS
  Filled 2021-02-28 (×4): qty 0.4

## 2021-02-28 NOTE — Progress Notes (Signed)
Micro-lab called and stated there was not enough cerebrospinal fluid to run the acid fast tests. If wanted done, another lumbar puncture wold be needed. MD for neuro was paged and made aware.

## 2021-02-28 NOTE — Progress Notes (Signed)
Neurology Update  Patient reports R temporal headache with R temporal visual field deficit, pachymeningeal enhancement on MRI, acute exacerbation of chronic R papilledema. LP w/ opening pressure WNL and no pleiocytosis. ESR, CRP pending. If elevated will initiate 3 days pulse steroids, order CDUS of head/neck/BUE, and reach out to ophtho to discuss temporal artery biopsy for possible GCA.  Su Monks, MD [facility] [phone]  If 7pm- 7am, please page neurology on call as listed in [place].

## 2021-02-28 NOTE — Plan of Care (Signed)
  Problem: Education: Goal: Knowledge of General Education information will improve Description Including pain rating scale, medication(s)/side effects and non-pharmacologic comfort measures Outcome: Progressing   

## 2021-02-28 NOTE — Progress Notes (Signed)
Neurology Progress Note  Patient [id number] p/w R temporal headache with R temporal visual field deficit, pachymeningeal enhancement on MRI, acute exacerbation of chronic R papilledema. R VF deficit developed 4 days ago. O/p ophtho noted decreased visual acuity OU compared to 2 weeks prior.  Subjective: Pt reports no change in visual sx. Continued low level headache R temporal w/o migrainous sx. No other complaints today.  Interval data: LP w/ opening pressure WNL and no pleiocytosis. ESR, CRP WNL.   Exam: Vitals:   02/28/21 0953 02/28/21 1413  BP: (!) 147/68 (!) 155/65  Pulse: 95 89  Resp: 18 17  Temp: 99.1 F (37.3 C) 99.8 F (37.7 C)  SpO2: 94% 93%   Mental status/Cognition: Alert, oriented to self, place, month and year, good attention.  Speech/language: Fluent, comprehension intact, object naming intact, repetition intact.  Cranial nerves:   CN II Pupils equal and reactive to light, R temporal vision blurred   CN III,IV,VI EOM intact, no gaze preference or deviation, no nystagmus   CN V normal sensation in V1, V2, and V3 segments bilaterally   CN VII no asymmetry, no nasolabial fold flattening   CN VIII normal hearing to speech   CN IX & X normal palatal elevation, no uvular deviation   CN XI 5/5 head turn and 5/5 shoulder shrug bilaterally    CN XII midline tongue protrusion   Motor:  Muscle bulk: normal, tone normal, pronator drift none tremor none Mvmt Root Nerve  Muscle Right Left Comments  SA C5/6 Ax Deltoid 5 5   EF C5/6 Mc Biceps 5 5   EE C6/7/8 Rad Triceps 5 5   WF C6/7 Med FCR     WE C7/8 PIN ECU     F Ab C8/T1 U ADM/FDI 5 5   HF L1/2/3 Fem Illopsoas 5 5   KE L2/3/4 Fem Quad 5 5   DF L4/5 D Peron Tib Ant 5 5   PF S1/2 Tibial Grc/Sol 5 5    Reflexes:  Right Left Comments  Pectoralis      Biceps (C5/6) 2 2   Brachioradialis (C5/6) 2 2    Triceps (C6/7) 2 2    Patellar (L3/4) 2 2    Achilles (S1)      Hoffman      Plantar      Jaw jerk     Sensation:  Light touch Intact throughout   Pin prick    Temperature    Vibration   Proprioception    Coordination/Complex Motor:  - Finger to Nose intact BL - Heel to shin intact BL - Rapid alternating movement are normal - Gait: Deferred.    Impression: Patient p/w R temporal headache with R temporal visual field deficit, pachymeningeal enhancement on MRI, acute exacerbation of chronic R papilledema. R VF deficit developed 4 days ago. O/p ophtho noted decreased visual acuity OU compared to 2 weeks prior. LP w/ opening pressure WNL and no pleiocytosis. ESR, CRP WNL. MRV head neg. GCA less likely given normal inflammatory markers but acute visual changes are concerning. While start pulse steroids x3 days and consult ophtho here for additional recommendations regarding ddx and further w/u for visual field deficits and optic disc abnormalities.  Patient has listed allergy to prednisone but states this was mild nausea and she is not even sure if it was actually prednisone that caused it or a different medication. She is amenable to pulse steroids now.  Recommendations: 1) 520m solumedrol q 24 x3 days 2)  I will consult ophtho in AM  Will continue to follow.  Su Monks, MD [facility] [phone]  If 7pm- 7am, please page neurology on call as listed in [place].

## 2021-02-28 NOTE — Progress Notes (Signed)
[facility] PROGRESS NOTE   Andrea Marks ZOX:[id number] DOB: 30-May-1951 DOA: 02/26/2021  PCP: Donato Schultz, DO  Brief History/Interval Summary: 70 y.o. female with medical history significant of DM2, HTN, HLD, morbid obesity. Pt has h/o Pseudotumor cerebri ~30 years ago she thinks.  Patient went to her neurologist and complained of headaches and right eye visual disturbances ongoing for a few days.  There was concern for papilledema.  She was sent over to her ophthalmologist who was concerned by the ophthalmological examination and sent the patient to the emergency department.  She was seen by neurology.  Plan is for lumbar puncture to make a diagnosis.  Concern is for pseudotumor cerebri.  Consultants: Neurology  Procedures: Lumbar puncture  Antibiotics: Anti-infectives (From admission, onward)   None      Subjective/Interval History: Patient continues to have pain around the right eye area.  Also has blurry vision.  Denies any nausea vomiting.  No chest pain or shortness of breath.      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  MRI was done in the emergency department.  Diffuse moderate pachymeningeal thickening and enhancement was noted.  There was also suggestion of papilledema in the right eye.   Patient was seen by inpatient neurology.  LP was done yesterday.  Normal opening pressures were noted.  It appears that neurology is rechecking inflammatory markers due to concern for temporal arteritis.  They are considering high-dose steroids.  Management per neurology.  Diabetes mellitus type 2 Reports that her blood glucose levels and A1c has been very well controlled with her last A1c being around 5.9.  She is on Lantus and NovoLog at home along with Ozempic.  Also appears to be on metformin.  These are being continued.   CBGs are reasonably  well controlled.    Essential hypertension Continue benazepril.  Blood pressure is reasonably well controlled.  Severe obesity Estimated body mass index is 48.71 kg/m as calculated from the following:   Height as of this encounter:  (1.6 m).   Weight as of this encounter: 124.7 kg.    DVT Prophylaxis: Initiate Lovenox Code Status: Full code Family Communication: Discussed with patient.  No family at bedside Disposition Plan: Hopefully return home when improved  Status is: Observation  The patient will require care spanning > 2 midnights and should be moved to inpatient because: Ongoing diagnostic testing needed not appropriate for outpatient work up, IV treatments appropriate due to intensity of illness or inability to take PO and Inpatient level of care appropriate due to severity of illness  Dispo: The patient is from: Home              Anticipated d/c is to: Home              Patient currently is not medically stable to d/c.   Difficult to place patient No      Medications:  Scheduled: . benazepril  20 mg Oral Daily  . ezetimibe  10 mg Oral QHS   And  . simvastatin  40 mg Oral QHS  . fenofibrate  160 mg Oral Daily  . FLUoxetine  60 mg Oral Daily  . insulin aspart  0-20 Units Subcutaneous TID WC  . insulin aspart  6 Units Subcutaneous TID WC  . insulin glargine  60 Units Subcutaneous QHS  . metFORMIN  1,700 mg  Oral Q supper  . metFORMIN  850 mg Oral Q breakfast  . pantoprazole  40 mg Oral Daily   Continuous:  ZOX:[id number]:acetaminophen **OR** acetaminophen, LORazepam, ondansetron **OR** ondansetron (ZOFRAN) IV   Objective:  Vital Signs  Vitals:   02/27/21 1001 02/27/21 1500 02/27/21 2011 02/28/21 0420  BP: (!) 155/66 137/69 127/66 132/84  Pulse: 79 79 91 (!) 104  Resp: 17 17 18 17   Temp: 98.2 F (36.8 C) 98.2 F (36.8 C) 98.6 F (37 C) 99 F (37.2 C)  TempSrc: Oral Oral Oral Oral  SpO2: 95% 95% 96% 93%  Weight:      Height:        Intake/Output  Summary (Last 24 hours) at 02/28/2021 0907 Last data filed at 02/28/2021 0427 Gross per 24 hour  Intake 200 ml  Output --  Net 200 ml   Filed Weights   02/27/21 0311  Weight: 124.7 kg    General appearance: Awake alert.  In no distress Resp: Clear to auscultation bilaterally.  Normal effort Cardio: S1-S2 is normal regular.  No S3-S4.  No rubs murmurs or bruit GI: Abdomen is soft.  Nontender nondistended.  Bowel sounds are present normal.  No masses organomegaly Extremities: No edema.  Full range of motion of lower extremities. Neurologic: Alert and oriented x3.  No focal neurological deficits.     Lab Results:  Data Reviewed: I have personally reviewed following labs and imaging studies  CBC: Recent Labs  Lab 02/26/21 1610 02/28/21 0159  WBC 10.4 7.2  NEUTROABS 7.2  --   HGB 14.2 12.2  HCT 46.6* 39.2  MCV 88.3 86.9  PLT 272 195    Basic Metabolic Panel: Recent Labs  Lab 02/26/21 1610 02/28/21 0159  NA 136 141  K 4.5 4.6  CL 99 103  CO2 27 29  GLUCOSE 138* 142*  BUN 21 20  CREATININE 1.08* 1.04*  CALCIUM 9.0 9.0    GFR: Estimated Creatinine Clearance: 64.6 mL/min (A) (by C-G formula based on SCr of 1.04 mg/dL (H)).   HbA1C: Recent Labs    02/26/21 1610  HGBA1C 5.9*    CBG: Recent Labs  Lab 02/27/21 0750 02/27/21 1152 02/27/21 1715 02/27/21 1952 02/28/21 0648  GLUCAP 172* 174* 193* 180* 179*     Recent Results (from the past 240 hour(s))  Resp Panel by RT-PCR (Flu A&B, Covid) Nasopharyngeal Swab     Status: None   Collection Time: 02/27/21  3:13 AM   Specimen: Nasopharyngeal Swab; Nasopharyngeal(NP) swabs in vial transport medium  Result Value Ref Range Status   SARS Coronavirus 2 by RT PCR NEGATIVE NEGATIVE Final    Comment: (NOTE) SARS-CoV-2 target nucleic acids are NOT DETECTED.  The SARS-CoV-2 RNA is generally detectable in upper respiratory specimens during the acute phase of infection. The lowest concentration of SARS-CoV-2  viral copies this assay can detect is 138 copies/mL. A negative result does not preclude SARS-Cov-2 infection and should not be used as the sole basis for treatment or other patient management decisions. A negative result may occur with  improper specimen collection/handling, submission of specimen other than nasopharyngeal swab, presence of viral mutation(s) within the areas targeted by this assay, and inadequate number of viral copies(<138 copies/mL). A negative result must be combined with clinical observations, patient history, and epidemiological information. The expected result is Negative.  Fact Sheet for Patients:  BloggerCourse.comhttps://www.fda.gov/media/152166/download  Fact Sheet for Healthcare Providers:  SeriousBroker.ithttps://www.fda.gov/media/152162/download  This test is no t yet approved or cleared by the [place] FDA and  has been authorized for detection and/or diagnosis of SARS-CoV-2 by FDA under an Emergency Use Authorization (EUA). This EUA will remain  in effect (meaning this test can be used) for the duration of the COVID-19 declaration under Section 564(b)(1) of the Act, 21 U.S.C.section 360bbb-3(b)(1), unless the authorization is terminated  or revoked sooner.       Influenza A by PCR NEGATIVE NEGATIVE Final   Influenza B by PCR NEGATIVE NEGATIVE Final    Comment: (NOTE) The Xpert Xpress SARS-CoV-2/FLU/RSV plus assay is intended as an aid in the diagnosis of influenza from Nasopharyngeal swab specimens and should not be used as a sole basis for treatment. Nasal washings and aspirates are unacceptable for Xpert Xpress SARS-CoV-2/FLU/RSV testing.  Fact Sheet for Patients: BloggerCourse.com  Fact Sheet for Healthcare Providers: SeriousBroker.it  This test is not yet approved or cleared by the [place] FDA and has been authorized for detection and/or diagnosis of SARS-CoV-2 by FDA under an Emergency Use Authorization  (EUA). This EUA will remain in effect (meaning this test can be used) for the duration of the COVID-19 declaration under Section 564(b)(1) of the Act, 21 U.S.C. section 360bbb-3(b)(1), unless the authorization is terminated or revoked.  Performed at [facility], [address] [place], [place] [address]   CSF culture w Gram Stain     Status: None (Preliminary result)   Collection Time: 02/27/21  9:30 AM   Specimen: PATH Cytology CSF; Cerebrospinal Fluid  Result Value Ref Range Status   Specimen Description CSF  Final   Special Requests NONE  Final   Gram Stain   Final    RARE WBC PRESENT, PREDOMINANTLY PMN NO ORGANISMS SEEN CYTOSPIN SMEAR    Culture   Final    NO GROWTH < 24 HOURS Performed at [facility], [address] [place], [place] [address]    Report Status PENDING  Incomplete      Radiology Studies: MR Brain Wo Contrast (neuro protocol)  Result Date: 02/26/2021 CLINICAL DATA:  Rapidly progressive optic disc edema EXAM: MRI HEAD WITHOUT CONTRAST TECHNIQUE: Multiplanar, multiecho pulse sequences of the brain and surrounding structures were obtained without intravenous contrast. COMPARISON:  None. FINDINGS: Mild motion artifact is present. Brain: There is no acute infarction or intracranial hemorrhage. There is no intracranial mass, mass effect, or edema. There is no hydrocephalus or extra-axial fluid collection. Prominence of the ventricles and sulci reflects generalized parenchymal volume loss. Patchy foci of T2 hyperintensity in the supratentorial white matter nonspecific but may reflect mild chronic microvascular ischemic changes. Vascular: Major vessel flow voids at the skull base are preserved. Skull and upper cervical spine: Normal marrow signal is preserved. Sinuses/Orbits: Paranasal sinuses are aerated. No significant orbital abnormality on this nondedicated study. Other: Sella is unremarkable.  Mastoid air cells are clear. IMPRESSION: No evidence of  recent infarction, hemorrhage, or mass. Mild chronic microvascular ischemic changes. Electronically Signed   By: Guadlupe Spanish M.D.   On: 02/26/2021 18:36   MR BRAIN W CONTRAST  Result Date: 02/27/2021 CLINICAL DATA:  Initial evaluation for acute visual loss. EXAM: MRI HEAD WITH CONTRAST MRV HEAD WITHOUT AND WITH CONTRAST TECHNIQUE: Multiplanar, multiecho pulse sequences of the brain and surrounding structures were obtained with intravenous contrast. Angiographic images of the intracranial venous structures were obtained using MRV technique without intravenous contrast. COMPARISON:  Prior noncontrast MRI from earlier the same day. CONTRAST:  50mL GADAVIST GADOBUTROL 1 MMOL/ML IV SOLN FINDINGS: MR HEAD FINDINGS Brain:  Postcontrast only images of the brain were performed. Sequences demonstrate diffuse smooth pachymeningeal enhancement, fairly symmetric in nature overlying both cerebral hemispheres. This was likely present on prior noncontrast brain MRI from earlier the same day. Otherwise, no other abnormal enhancement seen within the brain. No mass lesion, mass effect or midline shift. No hydrocephalus or extra-axial fluid collection. Pituitary gland and suprasellar region normal. Midline structures intact and normal. No other findings to suggest intracranial hypotension. Vascular: Normal intravascular enhancement seen throughout the brain. Skull and upper cervical spine: Craniocervical junction within normal limits. Bone marrow signal grossly within normal limits. No scalp soft tissue abnormality. Sinuses/Orbits: Subtle bulging of the optic nerve disc at the posterior right globe, suggesting papilledema (series 15, image 16). No appreciable bulging of the optic disc at the left globe. Prior bilateral ocular lens replacement. Globes and orbital soft tissues demonstrate no other acute finding. Mild scattered mucosal thickening noted within the ethmoidal air cells. Paranasal sinuses are otherwise clear. No mastoid  effusion. Inner ear structures grossly normal. Other: None. MRV HEAD FINDINGS Normal flow related signal and enhancement seen throughout the superior sagittal sinus to the torcula. Torcula itself appears patent. Transverse and sigmoid sinuses are patent as are the visualized proximal internal jugular veins. Right transverse sinus dominant, with a diffusely hypoplastic left transverse and sigmoid sinus. Straight sinus, vein of Galen, internal cerebral veins, and basal veins of Rosenthal appear patent. No evidence for dural sinus thrombosis. No appreciable dural sinus stenosis. No findings to suggest cavernous sinus thrombosis. IMPRESSION: 1. Diffuse smooth pachymeningeal thickening and enhancement overlying both cerebral hemispheres. Finding is of uncertain etiology or significance, and could be related to prior intervention. A possible infectious or inflammatory process would be the primary differential consideration. Correlation with CSF analysis recommended as clinically warranted. 2. Subtle bulging of the optic nerve disc at the posterior right globe, suggesting papilledema. 3. No other abnormal enhancement within the brain. 4. Normal intracranial MRV. No evidence for dural sinus thrombosis, stenosis, or other abnormality. Electronically Signed   By: Rise Mu M.D.   On: 02/27/2021 02:23   MR MRV HEAD W WO CONTRAST  Result Date: 02/27/2021 CLINICAL DATA:  Initial evaluation for acute visual loss. EXAM: MRI HEAD WITH CONTRAST MRV HEAD WITHOUT AND WITH CONTRAST TECHNIQUE: Multiplanar, multiecho pulse sequences of the brain and surrounding structures were obtained with intravenous contrast. Angiographic images of the intracranial venous structures were obtained using MRV technique without intravenous contrast. COMPARISON:  Prior noncontrast MRI from earlier the same day. CONTRAST:  68mL GADAVIST GADOBUTROL 1 MMOL/ML IV SOLN FINDINGS: MR HEAD FINDINGS Brain: Postcontrast only images of the brain were  performed. Sequences demonstrate diffuse smooth pachymeningeal enhancement, fairly symmetric in nature overlying both cerebral hemispheres. This was likely present on prior noncontrast brain MRI from earlier the same day. Otherwise, no other abnormal enhancement seen within the brain. No mass lesion, mass effect or midline shift. No hydrocephalus or extra-axial fluid collection. Pituitary gland and suprasellar region normal. Midline structures intact and normal. No other findings to suggest intracranial hypotension. Vascular: Normal intravascular enhancement seen throughout the brain. Skull and upper cervical spine: Craniocervical junction within normal limits. Bone marrow signal grossly within normal limits. No scalp soft tissue abnormality. Sinuses/Orbits: Subtle bulging of the optic nerve disc at the posterior right globe, suggesting papilledema (series 15, image 16). No appreciable bulging of the optic disc at the left globe. Prior bilateral ocular lens replacement. Globes and orbital soft tissues demonstrate no other acute finding. Mild scattered mucosal  thickening noted within the ethmoidal air cells. Paranasal sinuses are otherwise clear. No mastoid effusion. Inner ear structures grossly normal. Other: None. MRV HEAD FINDINGS Normal flow related signal and enhancement seen throughout the superior sagittal sinus to the torcula. Torcula itself appears patent. Transverse and sigmoid sinuses are patent as are the visualized proximal internal jugular veins. Right transverse sinus dominant, with a diffusely hypoplastic left transverse and sigmoid sinus. Straight sinus, vein of Galen, internal cerebral veins, and basal veins of Rosenthal appear patent. No evidence for dural sinus thrombosis. No appreciable dural sinus stenosis. No findings to suggest cavernous sinus thrombosis. IMPRESSION: 1. Diffuse smooth pachymeningeal thickening and enhancement overlying both cerebral hemispheres. Finding is of uncertain  etiology or significance, and could be related to prior intervention. A possible infectious or inflammatory process would be the primary differential consideration. Correlation with CSF analysis recommended as clinically warranted. 2. Subtle bulging of the optic nerve disc at the posterior right globe, suggesting papilledema. 3. No other abnormal enhancement within the brain. 4. Normal intracranial MRV. No evidence for dural sinus thrombosis, stenosis, or other abnormality. Electronically Signed   By: Rise Mu M.D.   On: 02/27/2021 02:23   DG FL GUIDED LUMBAR PUNCTURE  Result Date: 02/27/2021 Guadlupe Spanish, MD     02/27/2021  9:49 AM Lumbar Puncture Procedure Note Sakiya Stepka [id number] September 25, 1951 Date:02/27/21 Time:9:47 AM Provider Performing:Praneil Allena Katz Procedure: Fluoroscopy guided Lumbar Puncture Indication(s) Possible IIH Consent Risks of the procedure as well as the alternatives and risks of each were explained to the patient and/or caregiver.  Consent for the procedure was obtained and is signed in the bedside chart Anesthesia Topical only with 1% lidocaine Time Out Verified patient identification, verified procedure, site/side was marked, verified correct patient position, special equipment/implants available, medications/allergies/relevant history reviewed, required imaging and test results available. Sterile Technique Maximal sterile technique including sterile barrier drape, hand hygiene, sterile gown, sterile gloves, mask, hair covering. Procedure Description Using fluoroscopy, appropriate location needle placement chosen.   Lidocaine used to anesthetize skin and subcutaneous tissue overlying this area.  A 20g spinal needle was then used to access the subarachnoid space. There was no fluid return. Another 20g spinal needle was placed at the same level with slow return of clear CSF. 5 mL collected. Normal opening pressure. See report in PACS. Complications/Tolerance None; patient  tolerated the procedure well. EBL Minimal Specimen(s) CSF       LOS: 0 days   Khyree Carillo [employer] on [email or web address]  02/28/2021, 9:07 AM

## 2021-02-28 NOTE — Care Management Obs Status (Signed)
MEDICARE OBSERVATION STATUS NOTIFICATION   Patient Details  Name: Andrea Marks MRN: [id number] Date of Birth: [birth year]   Medicare Observation Status Notification Given:  Yes    Kermit Balo, RN 02/28/2021, 2:17 PM

## 2021-02-28 NOTE — Plan of Care (Signed)
  Problem: Education: Goal: Knowledge of General Education information will improve Description: Including pain rating scale, medication(s)/side effects and non-pharmacologic comfort measures Outcome: Progressing   Problem: Health Behavior/Discharge Planning: Goal: Ability to manage health-related needs will improve Outcome: Progressing   Problem: Clinical Measurements: Goal: Diagnostic test results will improve Outcome: Progressing   

## 2021-03-01 LAB — GLUCOSE, CAPILLARY
Glucose-Capillary: 314 mg/dL — ABNORMAL HIGH (ref 70–99)
Glucose-Capillary: 286 mg/dL — ABNORMAL HIGH (ref 70–99)
Glucose-Capillary: 225 mg/dL — ABNORMAL HIGH (ref 70–99)
Glucose-Capillary: 205 mg/dL — ABNORMAL HIGH (ref 70–99)

## 2021-03-01 LAB — CBC
HCT: 42.9 % (ref 36.0–46.0)
Hemoglobin: 13.4 g/dL (ref 12.0–15.0)
MCH: 27.3 pg (ref 26.0–34.0)
MCHC: 31.2 g/dL (ref 30.0–36.0)
MCV: 87.6 fL (ref 80.0–100.0)
Platelets: 183 10*3/uL (ref 150–400)
RBC: 4.9 MIL/uL (ref 3.87–5.11)
RDW: 14 % (ref 11.5–15.5)
WBC: 6.7 10*3/uL (ref 4.0–10.5)
nRBC: 0 % (ref 0.0–0.2)

## 2021-03-01 LAB — BASIC METABOLIC PANEL
Anion gap: 10 (ref 5–15)
BUN: 17 mg/dL (ref 8–23)
CO2: 26 mmol/L (ref 22–32)
Calcium: 9.1 mg/dL (ref 8.9–10.3)
Chloride: 102 mmol/L (ref 98–111)
Creatinine, Ser: 0.98 mg/dL (ref 0.44–1.00)
GFR, Estimated: >60 mL/min (ref >60)
Glucose, Bld: 263 mg/dL — ABNORMAL HIGH (ref 70–99)
Potassium: 4.9 mmol/L (ref 3.5–5.1)
Sodium: 138 mmol/L (ref 135–145)

## 2021-03-01 MED ORDER — LOPERAMIDE HCL 2 MG PO CAPS
4.0000 mg | ORAL_CAPSULE | Freq: Once | ORAL | Status: AC
  Administered 2021-03-01: 4 mg via ORAL
  Filled 2021-03-01: qty 2

## 2021-03-01 MED ORDER — SACCHAROMYCES BOULARDII 250 MG PO CAPS
250.0000 mg | ORAL_CAPSULE | Freq: Two times a day (BID) | ORAL | Status: DC
  Administered 2021-03-01 – 2021-03-04 (×7): 250 mg via ORAL
  Filled 2021-03-01 (×7): qty 1

## 2021-03-01 MED ORDER — INSULIN GLARGINE 100 UNIT/ML ~~LOC~~ SOLN
20.0000 [IU] | Freq: Every day | SUBCUTANEOUS | Status: DC
  Administered 2021-03-01 – 2021-03-02 (×2): 20 [IU] via SUBCUTANEOUS
  Filled 2021-03-01 (×2): qty 0.2

## 2021-03-01 MED ORDER — LOPERAMIDE HCL 2 MG PO CAPS: 4.0000 mg | ORAL_CAPSULE | Freq: Three times a day (TID) | ORAL | Status: DC | PRN

## 2021-03-01 NOTE — Progress Notes (Signed)
[facility] PROGRESS NOTE   Andrea Marks RWE:[id number] DOB: 18-May-1951 DOA: 02/26/2021  PCP: Donato Schultz, DO  Brief History/Interval Summary: 70 y.o. female with medical history significant of DM2, HTN, HLD, morbid obesity. Pt has h/o Pseudotumor cerebri ~30 years ago she thinks.  Patient went to her neurologist and complained of headaches and right eye visual disturbances ongoing for a few days.  There was concern for papilledema.  She was sent over to her ophthalmologist who was concerned by the ophthalmological examination and sent the patient to the emergency department.  She was seen by neurology.  Plan is for lumbar puncture to make a diagnosis.  Concern is for pseudotumor cerebri.  Consultants: Neurology  Procedures: Lumbar puncture  Antibiotics: Anti-infectives (From admission, onward)   None      Subjective/Interval History: Patient concerned about her high glucose levels this morning.  She was told that this is due to the high-dose steroids that she has been placed on.  She was reassured.  Told that her insulin dose will be adjusted.  Her right-sided eye pain and headache seems to be around 2-3 out of 10 in intensity this morning.    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  MRI was done in the emergency department.  Diffuse moderate pachymeningeal thickening and enhancement was noted.  There was also suggestion of papilledema in the right eye.   Patient was seen by inpatient neurology.  LP was done.  Normal opening pressures were noted.  There is concern for temporal arteritis.  Patient started on high-dose steroids by neurology.   Diabetes mellitus type 2 Reports that her blood glucose levels and A1c has been very well controlled with her last A1c being around 5.9.  She is on Lantus and NovoLog at home along with Ozempic.  Also  appears to be on metformin.  These are being continued. High CBGs this morning likely due to high-dose steroids that she is receiving.  Lantus dose will be adjusted.  Patient was reassured.  Essential hypertension Continue benazepril.  Blood pressure is reasonably well controlled.  Severe obesity Estimated body mass index is 48.71 kg/m as calculated from the following:   Height as of this encounter: 5\' 3"  (1.6 m).   Weight as of this encounter: 124.7 kg.    DVT Prophylaxis: Lovenox Code Status: Full code Family Communication: Discussed with patient.  No family at bedside Disposition Plan: Hopefully return home when improved.  Status is: Inpatient  Remains inpatient appropriate because:IV treatments appropriate due to intensity of illness or inability to take PO   Dispo:  Patient From: Home  Planned Disposition: Home  Medically stable for discharge: No         Medications:  Scheduled: . benazepril  20 mg Oral Daily  . enoxaparin (LOVENOX) injection  40 mg Subcutaneous Q24H  . ezetimibe  10 mg Oral QHS   And  . simvastatin  40 mg Oral QHS  . fenofibrate  160 mg Oral Daily  . FLUoxetine  60 mg Oral Daily  . insulin aspart  0-20 Units Subcutaneous TID WC  . insulin aspart  6 Units Subcutaneous TID WC  . insulin glargine  20 Units Subcutaneous Daily  . insulin glargine  60 Units Subcutaneous QHS  . metFORMIN  1,700 mg Oral Q supper  . metFORMIN  850 mg Oral Q breakfast  . pantoprazole  40 mg Oral Daily  . saccharomyces boulardii  250 mg Oral BID   Continuous: . methylPREDNISolone (SOLU-MEDROL) injection 500 mg (02/28/21 1812)   YHC:[id number] **OR** acetaminophen, loperamide, LORazepam, ondansetron **OR** ondansetron (ZOFRAN) IV   Objective:  Vital Signs  Vitals:   02/28/21 1413 02/28/21 1945 03/01/21 0500 03/01/21 0746  BP: (!) 155/65 (!) 171/58 (!) 163/75 (!) 149/63  Pulse: 89 84 79 83  Resp: 17 16 16 18   Temp: 99.8 F (37.7 C) 99 F (37.2 C) 98.1  F (36.7 C) 97.7 F (36.5 C)  TempSrc: Oral Oral Oral Oral  SpO2: 93% 95% 95% 94%  Weight:      Height:        Intake/Output Summary (Last 24 hours) at 03/01/2021 1044 Last data filed at 02/28/2021 1900 Gross per 24 hour  Intake 154 ml  Output --  Net 154 ml   Filed Weights   02/27/21 0311  Weight: 124.7 kg    General appearance: Awake alert.  In no distress Resp: Clear to auscultation bilaterally.  Normal effort Cardio: S1-S2 is normal regular.  No S3-S4.  No rubs murmurs or bruit GI: Abdomen is soft.  Nontender nondistended.  Bowel sounds are present normal.  No masses organomegaly Extremities: No edema.  Full range of motion of lower extremities. Neurologic: Alert and oriented x3.  No focal neurological deficits.     Lab Results:  Data Reviewed: I have personally reviewed following labs and imaging studies  CBC: Recent Labs  Lab 02/26/21 1610 02/28/21 0159 03/01/21 0052  WBC 10.4 7.2 6.7  NEUTROABS 7.2  --   --   HGB 14.2 12.2 13.4  HCT 46.6* 39.2 42.9  MCV 88.3 86.9 87.6  PLT 272 195 183    Basic Metabolic Panel: Recent Labs  Lab 02/26/21 1610 02/28/21 0159 03/01/21 0052  NA 136 141 138  K 4.5 4.6 4.9  CL 99 103 102  CO2 27 29 26   GLUCOSE 138* 142* 263*  BUN 21 20 17   CREATININE 1.08* 1.04* 0.98  CALCIUM 9.0 9.0 9.1    GFR: Estimated Creatinine Clearance: 68.6 mL/min (by C-G formula based on SCr of 0.98 mg/dL).   HbA1C: Recent Labs    02/26/21 1610  HGBA1C 5.9*    CBG: Recent Labs  Lab 02/28/21 0648 02/28/21 1138 02/28/21 1646 02/28/21 1948 03/01/21 0639  GLUCAP 179* 182* 120* 174* 314*     Recent Results (from the past 240 hour(s))  Resp Panel by RT-PCR (Flu A&B, Covid) Nasopharyngeal Swab     Status: None   Collection Time: 02/27/21  3:13 AM   Specimen: Nasopharyngeal Swab; Nasopharyngeal(NP) swabs in vial transport medium  Result Value Ref Range Status   SARS Coronavirus 2 by RT PCR NEGATIVE NEGATIVE Final    Comment:  (NOTE) SARS-CoV-2 target nucleic acids are NOT DETECTED.  The SARS-CoV-2 RNA is generally detectable in upper respiratory specimens during the acute phase of infection. The lowest concentration of SARS-CoV-2 viral copies this assay can detect is 138 copies/mL. A negative result does not preclude SARS-Cov-2 infection and should not be used as the sole basis for treatment or other patient management decisions. A negative result may occur with  improper specimen collection/handling, submission of specimen other than nasopharyngeal swab, presence of viral mutation(s) within the areas targeted by this assay, and inadequate number of viral copies(<138 copies/mL). A negative result must be combined with clinical observations, patient history, and epidemiological information. The expected result is Negative.  Fact Sheet for Patients:  BloggerCourse.com  Fact Sheet for Healthcare Providers:  SeriousBroker.it  This test is no t yet approved or cleared by the [place] FDA and  has been authorized for detection and/or diagnosis of SARS-CoV-2 by FDA under an Emergency Use Authorization (EUA). This EUA will remain  in effect (meaning this test can be used) for the duration of the COVID-19 declaration under Section 564(b)(1) of the Act, 21 U.S.C.section 360bbb-3(b)(1), unless the authorization is terminated  or revoked sooner.       Influenza A by PCR NEGATIVE NEGATIVE Final   Influenza B by PCR NEGATIVE NEGATIVE Final    Comment: (NOTE) The Xpert Xpress SARS-CoV-2/FLU/RSV plus assay is intended as an aid in the diagnosis of influenza from Nasopharyngeal swab specimens and should not be used as a sole basis for treatment. Nasal washings and aspirates are unacceptable for Xpert Xpress SARS-CoV-2/FLU/RSV testing.  Fact Sheet for Patients: BloggerCourse.com  Fact Sheet for Healthcare  Providers: SeriousBroker.it  This test is not yet approved or cleared by the [place] FDA and has been authorized for detection and/or diagnosis of SARS-CoV-2 by FDA under an Emergency Use Authorization (EUA). This EUA will remain in effect (meaning this test can be used) for the duration of the COVID-19 declaration under Section 564(b)(1) of the Act, 21 U.S.C. section 360bbb-3(b)(1), unless the authorization is terminated or revoked.  Performed at [facility], [address], [place], [place] [address]   CSF culture w Gram Stain     Status: None (Preliminary result)   Collection Time: 02/27/21  9:30 AM   Specimen: PATH Cytology CSF; Cerebrospinal Fluid  Result Value Ref Range Status   Specimen Description CSF  Final   Special Requests NONE  Final   Gram Stain   Final    RARE WBC PRESENT, PREDOMINANTLY PMN NO ORGANISMS SEEN CYTOSPIN SMEAR    Culture   Final    NO GROWTH 2 DAYS Performed at [facility], [address] [place], [place] [address]    Report Status PENDING  Incomplete  Culture, fungus without smear     Status: None (Preliminary result)   Collection Time: 02/27/21  9:30 AM   Specimen: PATH Cytology CSF; Cerebrospinal Fluid  Result Value Ref Range Status   Specimen Description CSF  Final   Special Requests NONE  Final   Culture   Final    NO FUNGUS ISOLATED AFTER 1 DAY Performed at [facility], [address], [place], [place] [address]    Report Status PENDING  Incomplete      Radiology Studies: No results found.     LOS: 1 day   Nyisha Clippard [employer] on [email or web address]  03/01/2021, 10:44 AM

## 2021-03-02 ENCOUNTER — Encounter: Payer: Self-pay | Admitting: Family Medicine

## 2021-03-02 LAB — BASIC METABOLIC PANEL
Anion gap: 11 (ref 5–15)
BUN: 24 mg/dL — ABNORMAL HIGH (ref 8–23)
CO2: 26 mmol/L (ref 22–32)
Calcium: 9.2 mg/dL (ref 8.9–10.3)
Chloride: 102 mmol/L (ref 98–111)
Creatinine, Ser: 0.98 mg/dL (ref 0.44–1.00)
GFR, Estimated: >60 mL/min (ref >60)
Glucose, Bld: 264 mg/dL — ABNORMAL HIGH (ref 70–99)
Potassium: 4.8 mmol/L (ref 3.5–5.1)
Sodium: 139 mmol/L (ref 135–145)

## 2021-03-02 LAB — CSF CULTURE W GRAM STAIN: Culture: NO GROWTH

## 2021-03-02 LAB — CBC
HCT: 40.5 % (ref 36.0–46.0)
Hemoglobin: 12.9 g/dL (ref 12.0–15.0)
MCH: 27.6 pg (ref 26.0–34.0)
MCHC: 31.9 g/dL (ref 30.0–36.0)
MCV: 86.5 fL (ref 80.0–100.0)
Platelets: 196 10*3/uL (ref 150–400)
RBC: 4.68 MIL/uL (ref 3.87–5.11)
RDW: 14 % (ref 11.5–15.5)
WBC: 8.2 10*3/uL (ref 4.0–10.5)
nRBC: 0 % (ref 0.0–0.2)

## 2021-03-02 LAB — MISC LABCORP TEST (SEND OUT): Labcorp test code: 3003261

## 2021-03-02 LAB — GLUCOSE, CAPILLARY
Glucose-Capillary: 299 mg/dL — ABNORMAL HIGH (ref 70–99)
Glucose-Capillary: 261 mg/dL — ABNORMAL HIGH (ref 70–99)
Glucose-Capillary: 216 mg/dL — ABNORMAL HIGH (ref 70–99)
Glucose-Capillary: 118 mg/dL — ABNORMAL HIGH (ref 70–99)

## 2021-03-02 MED ORDER — SODIUM CHLORIDE 0.9 % IV SOLN
1000.0000 mg | INTRAVENOUS | Status: DC
  Administered 2021-03-03: 1000 mg via INTRAVENOUS
  Filled 2021-03-02 (×2): qty 8

## 2021-03-02 MED ORDER — INSULIN GLARGINE 100 UNIT/ML ~~LOC~~ SOLN
10.0000 [IU] | Freq: Once | SUBCUTANEOUS | Status: AC
  Administered 2021-03-02: 10 [IU] via SUBCUTANEOUS
  Filled 2021-03-02: qty 0.1

## 2021-03-02 MED ORDER — INSULIN GLARGINE 100 UNIT/ML ~~LOC~~ SOLN: 30.0000 [IU] | Freq: Every day | SUBCUTANEOUS | Status: DC

## 2021-03-02 MED ORDER — SODIUM CHLORIDE 0.9 % IV SOLN
1000.0000 mg | Freq: Every day | INTRAVENOUS | Status: DC
  Filled 2021-03-02: qty 8

## 2021-03-02 MED ORDER — INSULIN GLARGINE 100 UNIT/ML ~~LOC~~ SOLN
30.0000 [IU] | Freq: Every day | SUBCUTANEOUS | Status: DC
  Administered 2021-03-03 – 2021-03-04 (×2): 30 [IU] via SUBCUTANEOUS
  Filled 2021-03-02 (×2): qty 0.3

## 2021-03-02 NOTE — Progress Notes (Signed)
[facility] PROGRESS NOTE   Andrea Marks OZD:[id number] DOB: [birth year] DOA: 02/26/2021  PCP: Donato Schultz, DO  Brief History/Interval Summary: 70 y.o. female with medical history significant of DM2, HTN, HLD, morbid obesity. Pt has h/o Pseudotumor cerebri ~30 years ago she thinks.  Patient went to her neurologist and complained of headaches and right eye visual disturbances ongoing for a few days.  There was concern for papilledema.  She was sent over to her ophthalmologist who was concerned by the ophthalmological examination and sent the patient to the emergency department.  She was seen by neurology.  Plan is for lumbar puncture to make a diagnosis.  Concern is for pseudotumor cerebri.  Consultants: Neurology  Procedures: Lumbar puncture  Antibiotics: Anti-infectives (From admission, onward)   None      Subjective/Interval History: Patient mentions that her headache is much better but continues to have blurry vision on the right.  No other complaints offered.  She was again reassured about her high glucose levels.      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  MRI was done in the emergency department.  Diffuse moderate pachymeningeal thickening and enhancement was noted.  There was also suggestion of papilledema in the right eye.   Patient was seen by inpatient neurology.  LP was done.  Normal opening pressures were noted.  There is concern for temporal arteritis.  Patient started on high-dose steroids by neurology.  Seems to be stable from a neurological standpoint.  Headache seems to be better.  Further management per neurology.  It appears that they have discussed with ophthalmology who plans to evaluate patient today.  Diabetes mellitus type 2 Reports that her blood glucose levels and A1c has been very well controlled with her last A1c  being around 5.9.  She is on Lantus and NovoLog at home along with Ozempic.  Also appears to be on metformin.  These are being continued. Elevated glucose levels are due to steroids.  Patient was reassured.  She is on a nightly dose of Lantus.  An a.m. dose of Lantus has been added while she is on high-dose steroids.  She was told that the glucose level should improve as steroid course is completed.  Essential hypertension Continue benazepril.  Blood pressure is reasonably well controlled.  Severe obesity Estimated body mass index is 48.71 kg/m as calculated from the following:   Height as of this encounter: 5\' 3"  (1.6 m).   Weight as of this encounter: 124.7 kg.    DVT Prophylaxis: Lovenox Code Status: Full code Family Communication: Discussed with patient.  No family at bedside Disposition Plan: Hopefully return home when improved.  Status is: Inpatient  Remains inpatient appropriate because:IV treatments appropriate due to intensity of illness or inability to take PO   Dispo:  Patient From: Home  Planned Disposition: Home  Medically stable for discharge: No         Medications:  Scheduled: . benazepril  20 mg Oral Daily  . enoxaparin (LOVENOX) injection  40 mg Subcutaneous Q24H  . ezetimibe  10 mg Oral QHS   And  . simvastatin  40 mg Oral QHS  . fenofibrate  160 mg Oral Daily  . FLUoxetine  60 mg Oral Daily  . insulin aspart  0-20 Units Subcutaneous TID WC  . insulin aspart  6 Units Subcutaneous TID WC  . insulin glargine  20 Units Subcutaneous Daily  . insulin glargine  60 Units Subcutaneous QHS  . metFORMIN  1,700 mg Oral Q supper  . metFORMIN  850 mg Oral Q breakfast  . pantoprazole  40 mg Oral Daily  . saccharomyces boulardii  250 mg Oral BID   Continuous: . methylPREDNISolone (SOLU-MEDROL) injection 500 mg (03/01/21 1708)   QIH:[id number] **OR** acetaminophen, loperamide, LORazepam, ondansetron **OR** ondansetron (ZOFRAN) IV   Objective:  Vital  Signs  Vitals:   03/01/21 1621 03/01/21 2008 03/02/21 0256 03/02/21 0755  BP: 136/78 (!) 152/70 (!) 149/68 (!) 158/63  Pulse: 78 78 69 77  Resp: 17 17 16 17   Temp: 98.7 F (37.1 C) 98.8 F (37.1 C) 97.9 F (36.6 C) 97.7 F (36.5 C)  TempSrc: Oral Oral Oral Oral  SpO2:  94% 96% 94%  Weight:      Height:        Intake/Output Summary (Last 24 hours) at 03/02/2021 1039 Last data filed at 03/01/2021 2200 Gross per 24 hour  Intake 174 ml  Output --  Net 174 ml   Filed Weights   02/27/21 0311  Weight: 124.7 kg    General appearance: Awake alert.  In no distress Resp: Clear to auscultation bilaterally.  Normal effort Cardio: S1-S2 is normal regular.  No S3-S4.  No rubs murmurs or bruit GI: Abdomen is soft.  Nontender nondistended.  Bowel sounds are present normal.  No masses organomegaly Extremities: No edema.  Full range of motion of lower extremities. Neurologic: Alert and oriented x3.  No focal neurological deficits.      Lab Results:  Data Reviewed: I have personally reviewed following labs and imaging studies  CBC: Recent Labs  Lab 02/26/21 1610 02/28/21 0159 03/01/21 0052 03/02/21 0351  WBC 10.4 7.2 6.7 8.2  NEUTROABS 7.2  --   --   --   HGB 14.2 12.2 13.4 12.9  HCT 46.6* 39.2 42.9 40.5  MCV 88.3 86.9 87.6 86.5  PLT 272 195 183 196    Basic Metabolic Panel: Recent Labs  Lab 02/26/21 1610 02/28/21 0159 03/01/21 0052 03/02/21 0351  NA 136 141 138 139  K 4.5 4.6 4.9 4.8  CL 99 103 102 102  CO2 27 29 26 26   GLUCOSE 138* 142* 263* 264*  BUN 21 20 17  24*  CREATININE 1.08* 1.04* 0.98 0.98  CALCIUM 9.0 9.0 9.1 9.2    GFR: Estimated Creatinine Clearance: 68.6 mL/min (by C-G formula based on SCr of 0.98 mg/dL).   HbA1C: No results for input(s): HGBA1C in the last 72 hours.  CBG: Recent Labs  Lab 03/01/21 0639 03/01/21 1214 03/01/21 1619 03/01/21 2101 03/02/21 0744  GLUCAP 314* 286* 225* 205* 299*     Recent Results (from the past 240  hour(s))  Resp Panel by RT-PCR (Flu A&B, Covid) Nasopharyngeal Swab     Status: None   Collection Time: 02/27/21  3:13 AM   Specimen: Nasopharyngeal Swab; Nasopharyngeal(NP) swabs in vial transport medium  Result Value Ref Range Status   SARS Coronavirus 2 by RT PCR NEGATIVE NEGATIVE Final    Comment: (NOTE) SARS-CoV-2 target nucleic acids are NOT DETECTED.  The SARS-CoV-2 RNA is generally detectable in upper respiratory specimens during the acute phase of infection. The lowest concentration of SARS-CoV-2 viral copies this assay can detect is 138 copies/mL. A negative result does not preclude SARS-Cov-2 infection and should not be used as the sole basis for treatment or other patient management decisions. A negative result may occur with  improper specimen collection/handling, submission of specimen other than nasopharyngeal swab, presence of viral mutation(s) within the areas targeted by this assay, and inadequate number of viral copies(<138 copies/mL). A negative result must be combined with clinical observations, patient history, and epidemiological information. The expected result is Negative.  Fact Sheet for Patients:  BloggerCourse.com  Fact Sheet for Healthcare Providers:  SeriousBroker.it  This test is no t yet approved or cleared by the [place] FDA and  has been authorized for detection and/or diagnosis of SARS-CoV-2 by FDA under an Emergency Use Authorization (EUA). This EUA will remain  in effect (meaning this test can be used) for the duration of the COVID-19 declaration under Section 564(b)(1) of the Act, 21 U.S.C.section 360bbb-3(b)(1), unless the authorization is terminated  or revoked sooner.       Influenza A by PCR NEGATIVE NEGATIVE Final   Influenza B by PCR NEGATIVE NEGATIVE Final    Comment: (NOTE) The Xpert Xpress SARS-CoV-2/FLU/RSV plus assay is intended as an aid in the diagnosis of influenza from  Nasopharyngeal swab specimens and should not be used as a sole basis for treatment. Nasal washings and aspirates are unacceptable for Xpert Xpress SARS-CoV-2/FLU/RSV testing.  Fact Sheet for Patients: BloggerCourse.com  Fact Sheet for Healthcare Providers: SeriousBroker.it  This test is not yet approved or cleared by the [place] FDA and has been authorized for detection and/or diagnosis of SARS-CoV-2 by FDA under an Emergency Use Authorization (EUA). This EUA will remain in effect (meaning this test can be used) for the duration of the COVID-19 declaration under Section 564(b)(1) of the Act, 21 U.S.C. section 360bbb-3(b)(1), unless the authorization is terminated or revoked.  Performed at [facility], [address], [place], [place] [address]   CSF culture w Gram Stain     Status: None (Preliminary result)   Collection Time: 02/27/21  9:30 AM   Specimen: PATH Cytology CSF; Cerebrospinal Fluid  Result Value Ref Range Status   Specimen Description CSF  Final   Special Requests NONE  Final   Gram Stain   Final    RARE WBC PRESENT, PREDOMINANTLY PMN NO ORGANISMS SEEN CYTOSPIN SMEAR    Culture   Final    NO GROWTH 2 DAYS Performed at [facility], [address], [place], [place] [address]    Report Status PENDING  Incomplete  Culture, fungus without smear     Status: None (Preliminary result)   Collection Time: 02/27/21  9:30 AM   Specimen: PATH Cytology CSF; Cerebrospinal Fluid  Result Value Ref Range Status   Specimen Description CSF  Final   Special Requests NONE  Final   Culture   Final    NO FUNGUS ISOLATED AFTER 2 DAYS Performed at [facility], [address], [place], [place] [address]    Report Status PENDING  Incomplete      Radiology Studies: No results found.     LOS: 2 days   Andrea Marks Andrea Marks  [facility] Pager on [email or web address]  03/02/2021, 10:39 AM

## 2021-03-02 NOTE — Plan of Care (Signed)
Patient in no acute distress. Self care with ADLs. Vital signs stable. Received 60 units of Lantus per order. Problem: Education: Goal: Knowledge of General Education information will improve Description: Including pain rating scale, medication(s)/side effects and non-pharmacologic comfort measures Outcome: Progressing   Problem: Health Behavior/Discharge Planning: Goal: Ability to manage health-related needs will improve Outcome: Progressing   Problem: Clinical Measurements: Goal: Diagnostic test results will improve Outcome: Progressing

## 2021-03-02 NOTE — Progress Notes (Signed)
Neurology Progress Note  Patient ME:QASTMHD p/w R temporal headache with R temporal visual field deficit, pachymeningeal enhancement on MRI, acute exacerbation of chronic R papilledema. R VF deficit developed 4 days ago. O/p ophtho noted decreased visual acuity OU compared to 2 weeks prior.MRV neg. LP w/ opening pressure WNL and no pleiocytosis. ESR, CRP WNL.  Subjective: Pt reports no change in visual sx. Headache improved after solumedrol. No other complaints today.  Interval data:   Exam: Vitals:   03/01/21 2008 03/02/21 0256  BP: (!) 152/70 (!) 149/68  Pulse: 78 69  Resp: 17 16  Temp: 98.8 F (37.1 C) 97.9 F (36.6 C)  SpO2: 94% 96%   Mental status/Cognition: Alert, oriented to self, place, month and year, good attention.  Speech/language: Fluent, comprehension intact, object naming intact, repetition intact.  Cranial nerves:   CN II Pupils equal and reactive to light, R temporal vision blurred   CN III,IV,VI EOM intact, no gaze preference or deviation, no nystagmus   CN V normal sensation in V1, V2, and V3 segments bilaterally   CN VII no asymmetry, no nasolabial fold flattening   CN VIII normal hearing to speech   CN IX & X normal palatal elevation, no uvular deviation   CN XI 5/5 head turn and 5/5 shoulder shrug bilaterally    CN XII midline tongue protrusion   Motor:  Muscle bulk: normal, tone normal, pronator drift none tremor none Mvmt Root Nerve  Muscle Right Left Comments  SA C5/6 Ax Deltoid 5 5   EF C5/6 Mc Biceps 5 5   EE C6/7/8 Rad Triceps 5 5   WF C6/7 Med FCR     WE C7/8 PIN ECU     F Ab C8/T1 U ADM/FDI 5 5   HF L1/2/3 Fem Illopsoas 5 5   KE L2/3/4 Fem Quad 5 5   DF L4/5 D Peron Tib Ant 5 5   PF S1/2 Tibial Grc/Sol 5 5    Reflexes:  Right Left Comments  Pectoralis      Biceps (C5/6) 2 2   Brachioradialis (C5/6) 2 2    Triceps (C6/7) 2 2    Patellar (L3/4) 2 2    Achilles (S1)      Hoffman      Plantar     Jaw  jerk     Sensation:  Light touch Intact throughout   Pin prick    Temperature    Vibration   Proprioception    Coordination/Complex Motor:  - Finger to Nose intact BL - Heel to shin intact BL - Rapid alternating movement are normal - Gait: Deferred.    Impression: Patient p/w R temporal headache with R temporal visual field deficit, pachymeningeal enhancement on MRI, acute exacerbation of chronic R papilledema. R VF deficit developed 4 days ago. O/p ophtho noted decreased visual acuity OU compared to 2 weeks prior. LP w/ opening pressure WNL and no pleiocytosis. ESR, CRP WNL. MRV head neg. GCA less likely given normal inflammatory markers but acute visual changes are concerning. While start pulse steroids x3 days and consult ophtho here for additional recommendations regarding ddx and further w/u for visual field deficits and optic disc abnormalities.  Patient has listed allergy to prednisone but states this was mild nausea and she is not even sure if it was actually prednisone that caused it or a different medication. She is amenable to pulse steroids now.  Recommendations: 1) Continue $RemoveBef'500mg'[id number]$  solumedrol q 24 x3 days end date 5/2; consider  continuation based on re-examination and discussion with ophtho 2) Request ophtho to consult Monday (her outpatient ophtho Dr. Valetta Close is covering consults)  Will continue to follow.  Su Monks, MD [facility] [phone]  If 7pm- 7am, please page neurology on call as listed in [place].

## 2021-03-02 NOTE — Progress Notes (Signed)
Inpatient Diabetes Program Recommendations  AACE/ADA: New Consensus Statement on Inpatient Glycemic Control  Target Ranges:  Prepandial:   less than 140 mg/dL      Peak postprandial:   less than 180 mg/dL (1-2 hours)      Critically ill patients:  140 - 180 mg/dL  Results for Andrea Marks, Andrea Marks (MRN [id number]) as of 03/02/2021 12:13  Ref. Range 03/01/2021 06:39 03/01/2021 12:14 03/01/2021 16:19 03/01/2021 21:01 03/02/2021 07:44 03/02/2021 11:40  Glucose-Capillary Latest Ref Range: 70 - 99 mg/dL 622 (H) 297 (H) 989 (H) 205 (H) 299 (H) 261 (H)    Review of Glycemic Control  Diabetes history: DM2 Outpatient Diabetes medications: Basaglar 60 units QHS, Novolog 18-24 units TID with meals, Metformin 850 mg QAM, Metformin 1700 mg QPM Current orders for Inpatient glycemic control: Lantus 20 units QAM, Lantus 60 units QHS, Novolog 6 units TID with meals, Novolog 0- 20  Units TID with meals, Metformin 1700 mg QAM, Metformin 500 mg QPM; Solumedrol 500 mg Q24H  Inpatient Diabetes Program Recommendations:    Insulin: If steroids are continued as ordered, please consider increasing Lantus to 30 units QAM and meal coverage to Novolog 12 units TID with meals.  Thanks, Orlando Penner, RN, MSN, CDE Diabetes Coordinator Inpatient Diabetes Program [phone] (Team Pager from 8am to 5pm)

## 2021-03-02 NOTE — Progress Notes (Addendum)
Neurology Progress Note Brief HPI: 70 y.o. female presented 4/28 with right temporal headache and right temporal visual field deficit in right eye for approximately 4 days. 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 LP performed with normal opening pressure and without pleiocytosis. IV Solu-Medrol initiated 4/30.   Subjective: No acute overnight events Discussed patient assessment and plan of care with outpatient ophthalmologist  Patient does state that she has been having headaches from her right eye with associated photophobia since she had a cataract operation on it in October 2021 She does endorse improvement in her right eye pain/headache since receiving steroids (improved from 4 - 5/10 to a 1/10)  On visual testing today she is able to see the numbers 9 , 10, and 11 on the analogue clock, but not the remaining numbers. She has visual impairment of the right superior, right inferior, and left inferior visual quadrants in her right eye with sparing of the left superior quadrant. Visual acuity in her left eye is normal.  Patient does endorse having an LP completed approximately 35 years ago and being told that she had pseudotumor cerebri but was unable to provide further information MRI inpatient was completed prior to successful LP in IR  Exam: Vitals:   03/02/21 0256 03/02/21 0755  BP: (!) 149/68 (!) 158/63  Pulse: 69 77  Resp: 16 17  Temp: 97.9 F (36.6 C) 97.7 F (36.5 C)  SpO2: 96% 94%   Gen: Sitting up at bedside, in no acute distress Resp: non-labored breathing, no respiratory distress Abd: soft, non-tender  Neuro: MS: Awake, alert, oriented x 4. She is able to provide a clear and coherent history of present illness. Speech is intact without dysarthria or aphasia. Speech is fluent, comprehension is intact CN: PERRL, right eye visual field deficits  in right superior/inferior and left inferior visual fields- sparing left superior quadrant, no visual impairment from left eye, hearing is intact to voice, face is symmetric, phonation normal, head is midline.  Motor: moves all extremities spontaneously and with antigravity movement Gait: Deferred  Pertinent Labs: CBC    Component Value Date/Time   WBC 8.2 03/02/2021 0351   RBC 4.68 03/02/2021 0351   HGB 12.9 03/02/2021 0351   HCT 40.5 03/02/2021 0351   PLT 196 03/02/2021 0351   MCV 86.5 03/02/2021 0351   MCH 27.6 03/02/2021 0351   MCHC 31.9 03/02/2021 0351   RDW 14.0 03/02/2021 0351   LYMPHSABS 2.2 02/26/2021 1610   MONOABS 0.7 02/26/2021 1610   EOSABS 0.1 02/26/2021 1610   BASOSABS 0.1 02/26/2021 1610   CMP     Component Value Date/Time   NA 139 03/02/2021 0351   K 4.8 03/02/2021 0351   CL 102 03/02/2021 0351   CO2 26 03/02/2021 0351   GLUCOSE 264 (H) 03/02/2021 0351   BUN 24 (H) 03/02/2021 0351   CREATININE 0.98 03/02/2021 0351   CREATININE 0.89 07/12/2014 1550   CALCIUM 9.2 03/02/2021 0351   PROT 6.0 01/01/2021 1030   ALBUMIN 3.7 01/01/2021 1030   AST 13 01/01/2021 1030   ALT 12 01/01/2021 1030   ALKPHOS 26 (L) 01/01/2021 1030   BILITOT 0.4 01/01/2021 1030   GFRNONAA >60 03/02/2021 0351   GFRAA >60 11/15/2016 1433   Results for orders placed or performed during the hospital encounter of 02/26/21  Resp Panel by RT-PCR (Flu A&B, Covid) Nasopharyngeal Swab  Status: None   Collection Time: 02/27/21  3:13 AM   Specimen: Nasopharyngeal Swab; Nasopharyngeal(NP) swabs in vial transport medium  Result Value Ref Range Status   SARS Coronavirus 2 by RT PCR NEGATIVE NEGATIVE Final    Comment: (NOTE) SARS-CoV-2 target nucleic acids are NOT DETECTED.  The SARS-CoV-2 RNA is generally detectable in upper respiratory specimens during the acute phase of infection. The lowest concentration of SARS-CoV-2 viral copies this assay can detect is 138 copies/mL. A negative result  does not preclude SARS-Cov-2 infection and should not be used as the sole basis for treatment or other patient management decisions. A negative result may occur with  improper specimen collection/handling, submission of specimen other than nasopharyngeal swab, presence of viral mutation(s) within the areas targeted by this assay, and inadequate number of viral copies(<138 copies/mL). A negative result must be combined with clinical observations, patient history, and epidemiological information. The expected result is Negative.  Fact Sheet for Patients:  EntrepreneurPulse.com.au  Fact Sheet for Healthcare Providers:  IncredibleEmployment.be  This test is no t yet approved or cleared by the [place] FDA and  has been authorized for detection and/or diagnosis of SARS-CoV-2 by FDA under an Emergency Use Authorization (EUA). This EUA will remain  in effect (meaning this test can be used) for the duration of the COVID-19 declaration under Section 564(b)(1) of the Act, 21 U.S.C.section 360bbb-3(b)(1), unless the authorization is terminated  or revoked sooner.       Influenza A by PCR NEGATIVE NEGATIVE Final   Influenza B by PCR NEGATIVE NEGATIVE Final    Comment: (NOTE) The Xpert Xpress SARS-CoV-2/FLU/RSV plus assay is intended as an aid in the diagnosis of influenza from Nasopharyngeal swab specimens and should not be used as a sole basis for treatment. Nasal washings and aspirates are unacceptable for Xpert Xpress SARS-CoV-2/FLU/RSV testing.  Fact Sheet for Patients: EntrepreneurPulse.com.au  Fact Sheet for Healthcare Providers: IncredibleEmployment.be  This test is not yet approved or cleared by the [place] FDA and has been authorized for detection and/or diagnosis of SARS-CoV-2 by FDA under an Emergency Use Authorization (EUA). This EUA will remain in effect (meaning this test can be used) for  the duration of the COVID-19 declaration under Section 564(b)(1) of the Act, 21 U.S.C. section 360bbb-3(b)(1), unless the authorization is terminated or revoked.  Performed at [facility], [place] [address], [place], [place] [address]   CSF culture w Gram Stain     Status: None   Collection Time: 02/27/21  9:30 AM   Specimen: PATH Cytology CSF; Cerebrospinal Fluid  Result Value Ref Range Status   Specimen Description CSF  Final   Special Requests NONE  Final   Gram Stain   Final    RARE WBC PRESENT, PREDOMINANTLY PMN NO ORGANISMS SEEN CYTOSPIN SMEAR    Culture   Final    NO GROWTH 3 DAYS Performed at [facility], [place] [address], [place], [place] [address]    Report Status 03/02/2021 FINAL  Final  Culture, fungus without smear     Status: None (Preliminary result)   Collection Time: 02/27/21  9:30 AM   Specimen: PATH Cytology CSF; Cerebrospinal Fluid  Result Value Ref Range Status   Specimen Description CSF  Final   Special Requests NONE  Final   Culture   Final    NO FUNGUS ISOLATED AFTER 3 DAYS Performed at [facility], [place] [address], [place], [place] [address]    Report Status PENDING  Incomplete   Sed Rate 4/30: 13 HIV: Non Reactive  Results for MISKI, FELDPAUSCH (MRN [id number]) as of 03/02/2021 14:04  Ref. Range 02/27/2021 12:10  Angiotensin-Converting Enzyme Latest Ref Range: 14 - 82 U/L <5 (L)  RA Latex Turbid. Latest Ref Range: <14.0 IU/mL <10.0  IgG, Subclass 4 Latest Ref Range: 2 - 96 mg/dL 1 (L)   Results for ALIANNA, WURSTER (MRN [id number]) as of 03/02/2021 14:04  Ref. Range 02/27/2021 09:30  Appearance, CSF Latest Ref Range: CLEAR  CLEAR  Glucose, CSF Latest Ref Range: 40 - 70 mg/dL 99 (H)  RBC Count, CSF Latest Ref Range: 0 /cu mm 196 (H)  WBC, CSF Latest Ref Range: 0 - 5 /cu mm 1  Other Cells, CSF Unknown TOO FEW TO COUNT, SMEAR AVAILABLE FOR REVIEW  Color, CSF Latest Ref Range: COLORLESS  COLORLESS  Supernatant Unknown NOT  INDICATED  Total  Protein, CSF Latest Ref Range: 15 - 45 mg/dL 41  Tube # Unknown 3   Imaging Reviewed with Dr. Cheral Marker: MRI Brain 4/28, without contrast:  No evidence of recent infarction, hemorrhage, or mass. Mild chronic microvascular ischemic changes.  MR MRV Head wwo contrast: 1. Diffuse smooth pachymeningeal thickening and enhancement overlying both cerebral hemispheres. Finding is of uncertain etiology or significance, and could be related to prior intervention (note: LP performed after MRI). A possible infectious or inflammatory process would be the primary differential consideration. Correlation with CSF analysis recommended as clinically warranted. 2. Subtle bulging of the optic nerve disc at the posterior right globe, suggesting papilledema. 3. No other abnormal enhancement within the brain. 4. Normal intracranial MRV. No evidence for dural sinus thrombosis, stenosis, or other abnormality.  Impression: Patient p/w R temporal headache with R temporal visual field deficit, pachymeningeal enhancement on MRI, acute exacerbation of chronic R papilledema. R VF deficit developed 4 days prior to arrival. O/p ophtho noted decreased visual acuity OU compared to 2 weeks prior with bilateral papilledema on examination.  - Examination reveals patient with improvement of chronic right periorbital headache from 4-5/10 to a 0-1/10. Right visual field deficits include right inferior and superior quadrants and left inferior quadrant only sparing left superior visual field. No pain or visual deficit noted with assessment of left visual field.  - Work up has been significant for: LP w/ opening pressure WNL, normal protein level and no pleiocytosis.ESR, CRP WNL.  - ACE normal, which makes neurosarcoidosis less likely, and neurosarcoidosis tends to present with nodular multifocal enhancement of the meninges rather than diffuse dural thickening. She also has no pulmonary symptoms.  - MRV head neg. GCA  unlikely given normal inflammatory markers, no history of temporal region tenderness, no jaw claudication and no myalgias. - Most likely etiology for the papilledema is a pachymeningeal inflammatory process with possible associated thickening of the dural sleeves of the optic nerves - Wegener granulomatosis is possible as it can present with diffuse dural thickening. However, the patient is without sinus or URI symptoms.  - Eosinophilic granulomatosis with polyangitis (EGPA aka Churg-Strauss syndrome) can also present with diffuse pachymeningeal thickening - LP opening pressure normal, which militates against pseudotumor cerebri.  - Dural thickening and headache can occur with low-CSF pressure; however, opening pressure with LP was normal - TB can present with diffuse dural thickening and cranial neuropathies.  - Carcinomatous meningitis is on the DDx as well - Pulse steroids 500 mg Solu-Medrol day 3/3 completed on 03/02/2021  - Discussed with outpatient ophthalmologist who recommended completing 5 days of pulse dose  steroids and he will reassess her Thursday for papilledema and visual acuity - Patient has listed allergy to prednisone but states this was mild nausea and she is not even sure if it was actually prednisone that caused it or a different medication. She is amenable to pulse steroids now.  Recommendations: - Today is day 3/3 of original 500 mg Solu-Medrol; plan for 2 additional days with 1,000 mg Solu-Medrol each day - Repeat MRI brain wwo following last dose of Solu-Medrol Wednesday morning 5/4 - ANCA panel pending (for possible Wegener granulomatosis or Churg-Strauss syndrome) - Discussed with ophthalmology; patient has repeat appointment scheduled for 5/5 for reevaluation- if there is improvement / stability of visual acuity and headache following 5 days of steroids, it is reasonable to follow up closely outpatient with ophthalmology for repeat examination - PPI and SSI with high dose  steroids - May need repeat LP for cytology and flow cytometry to assess for possible carcinomatous meningitis - Quantiferon test and CSF AFB smear (ordered) - Neurology will continue to follow  Anibal Henderson, AGACNP-BC [facility] [phone]  Electronically signed: Dr. Kerney Elbe

## 2021-03-02 NOTE — Plan of Care (Signed)
  Problem: Education: Goal: Knowledge of General Education information will improve Description: Including pain rating scale, medication(s)/side effects and non-pharmacologic comfort measures Outcome: Progressing   Problem: Health Behavior/Discharge Planning: Goal: Ability to manage health-related needs will improve Outcome: Progressing   Problem: Clinical Measurements: Goal: Diagnostic test results will improve Outcome: Progressing   

## 2021-03-03 LAB — CYTOLOGY - NON PAP

## 2021-03-03 LAB — ANCA TITERS
Atypical P-ANCA titer: <1:20 {titer}
C-ANCA: <1:20 {titer}
P-ANCA: <1:20 {titer}

## 2021-03-03 LAB — BASIC METABOLIC PANEL
Anion gap: 12 (ref 5–15)
BUN: 31 mg/dL — ABNORMAL HIGH (ref 8–23)
CO2: 25 mmol/L (ref 22–32)
Calcium: 8.8 mg/dL — ABNORMAL LOW (ref 8.9–10.3)
Chloride: 101 mmol/L (ref 98–111)
Creatinine, Ser: 1.03 mg/dL — ABNORMAL HIGH (ref 0.44–1.00)
GFR, Estimated: 58 mL/min — ABNORMAL LOW (ref >60)
Glucose, Bld: 262 mg/dL — ABNORMAL HIGH (ref 70–99)
Potassium: 4.5 mmol/L (ref 3.5–5.1)
Sodium: 138 mmol/L (ref 135–145)

## 2021-03-03 LAB — CBC
HCT: 40.6 % (ref 36.0–46.0)
Hemoglobin: 12.9 g/dL (ref 12.0–15.0)
MCH: 27.4 pg (ref 26.0–34.0)
MCHC: 31.8 g/dL (ref 30.0–36.0)
MCV: 86.2 fL (ref 80.0–100.0)
Platelets: 196 10*3/uL (ref 150–400)
RBC: 4.71 MIL/uL (ref 3.87–5.11)
RDW: 14 % (ref 11.5–15.5)
WBC: 8.4 10*3/uL (ref 4.0–10.5)
nRBC: 0 % (ref 0.0–0.2)

## 2021-03-03 LAB — OLIGOCLONAL BANDS, CSF + SERM

## 2021-03-03 LAB — GLUCOSE, CAPILLARY
Glucose-Capillary: 207 mg/dL — ABNORMAL HIGH (ref 70–99)
Glucose-Capillary: 276 mg/dL — ABNORMAL HIGH (ref 70–99)
Glucose-Capillary: 143 mg/dL — ABNORMAL HIGH (ref 70–99)
Glucose-Capillary: 152 mg/dL — ABNORMAL HIGH (ref 70–99)

## 2021-03-03 NOTE — Progress Notes (Signed)
Inpatient Diabetes Program Recommendations  AACE/ADA: New Consensus Statement on Inpatient Glycemic Control (2015)  Target Ranges:  Prepandial:   less than 140 mg/dL      Peak postprandial:   less than 180 mg/dL (1-2 hours)      Critically ill patients:  140 - 180 mg/dL   Lab Results  Component Value Date   GLUCAP 207 (H) 03/03/2021   HGBA1C 5.9 (H) 02/26/2021    Review of Glycemic Control Results for Andrea Marks, Andrea Marks (MRN [id number]) as of 03/03/2021 10:29  Ref. Range 03/02/2021 07:44 03/02/2021 11:40 03/02/2021 15:24 03/02/2021 21:06 03/03/2021 06:44  Glucose-Capillary Latest Ref Range: 70 - 99 mg/dL 937 (H) 169 (H) 678 (H) 118 (H) 207 (H)   Diabetes history: DM 2 Outpatient Diabetes medications: Basaglar 60 units QHS, Novolog 18-24 units TID with meals, Metformin 850 mg QAM, Metformin 1700 mg QPM Current orders for Inpatient glycemic control:  Lantus 20 units QAM, Lantus 60 units QHS, Novolog 6 units TID with meals, Novolog 0- 20  Units TID with meals, Metformin 1700 mg QAM, Metformin 500 mg QPM; Solumedrol 500 mg Q24H  Inpatient Diabetes Program Recommendations:    -  Increase Daily Lantus to 30 units -  Increase Novolog meal coverage to 10 units tid.  Thanks,  Christena Deem RN, MSN, BC-ADM Inpatient Diabetes Coordinator Team Pager [phone] (8a-5p)

## 2021-03-03 NOTE — TOC Initial Note (Signed)
Transition of Care [facility]) - Initial/Assessment Note    Patient Details  Name: Andrea Marks MRN: [id number] Date of Birth: 05-Apr-1951  Transition of [facility]) CM/SW Contact:    Epifanio Lesches, RN Phone Number: 03/03/2021, 12:04 PM  Clinical Narrative:                 Presented with Papilledema, hx of DM2, HTN, HLD, morbid obesity. Resides with husband,Thomas. PTA independent with ADL's, no DME usage. PCP: Zola Button, YVONNE R  Pt without Rx med concerns or affordability. P t states will have transportation to home once d/c.  Pt without present TOC needs... TOC team will continue to monitor  Expected Discharge Plan: Home/Self Care Barriers to Discharge: Continued Medical Work up   Patient Goals and CMS Choice        Expected Discharge Plan and Services Expected Discharge Plan: Home/Self Care       Living arrangements for the past 2 months: Single Family Home                                      Prior Living Arrangements/Services Living arrangements for the past 2 months: Single Family Home Lives with:: Spouse Patient language and need for interpreter reviewed:: Yes Do you feel safe going back to the place where you live?: Yes      Need for Family Participation in Patient Care: Yes (Comment) Care giver support system in place?: Yes (comment)   Criminal Activity/Legal Involvement Pertinent to Current Situation/Hospitalization: No - Comment as needed  Activities of Daily Living Home Assistive Devices/Equipment: None ADL Screening (condition at time of admission) Patient's cognitive ability adequate to safely complete daily activities?: Yes Is the patient deaf or have difficulty hearing?: No Does the patient have difficulty seeing, even when wearing glasses/contacts?: No Does the patient have difficulty concentrating, remembering, or making decisions?: No Patient able to express need for assistance with ADLs?: Yes Does the patient have difficulty  dressing or bathing?: No Independently performs ADLs?: Yes (appropriate for developmental age) Does the patient have difficulty walking or climbing stairs?: No Weakness of Legs: None Weakness of Arms/Hands: None  Permission Sought/Granted   Permission granted to share information with : Yes, Verbal Permission Granted  Share Information with NAME: Colleen Kotlarz (Spouse) [phone]           Emotional Assessment Appearance:: Appears stated age Attitude/Demeanor/Rapport: Engaged Affect (typically observed): Pleasant Orientation: : Oriented to Self,Oriented to Place,Oriented to  Time,Oriented to Situation Alcohol / Substance Use: Not Applicable Psych Involvement: No (comment)  Admission diagnosis:  Wheezing [R06.2] Idiopathic intracranial hypertension [G93.2] Papilledema [H47.10] Patient Active Problem List   Diagnosis Date Noted  . Vision loss of right eye   . Nonintractable headache   . 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 Degenerative arthritis of knee, bilateral 10/30/2020  . Degenerative arthritis of right knee 12/25/2018  . Fatigue 12/15/2018  . Hyperlipidemia associated with type 2 diabetes mellitus (HCC) 12/15/2018  . B12 deficiency 12/15/2018  . AC (acromioclavicular) joint arthritis 06/20/2018  . Left rotator cuff tear 05/29/2018  . Hyperlipidemia LDL goal <70 12/19/2017  . Morbid obesity (HCC) 11/15/2016  . Type 2 diabetes mellitus with hyperglycemia, with long-term current use of insulin (HCC) 10/23/2015  . 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 Thrombophlebitis leg 02/23/2011  . TINEA CORPORIS 12/30/2008  . DEPRESSION 12/14/2006  . Essential hypertension 12/14/2006  . GERD 12/14/2006   PCP:  Donato Schultz, DO Pharmacy:   [facility] - [place], [place] - [address] AT Portal to Registered [employer] Sites [address] [place] [address] Phone: [phone] Fax: [phone]  [facility] - [place], [place] - [address] [facility] [address] [place] [address] Phone: [phone] Fax: [phone]  [id number] [place], [place] - [address] AT [facility] [address] [place] [address] Phone: [phone] Fax: [phone]  [facility] [address] [place] [address] Phone: [phone] Fax: [phone]     Social Determinants of Health (SDOH) Interventions    Readmission Risk Interventions No flowsheet data found.

## 2021-03-03 NOTE — Progress Notes (Signed)
[facility] PROGRESS NOTE   Andrea Marks PVV:[id number] DOB: 08-May-1951 DOA: 02/26/2021  PCP: Donato Schultz, DO  Brief History/Interval Summary: 70 y.o. female with medical history significant of DM2, HTN, HLD, morbid obesity. Pt has h/o Pseudotumor cerebri ~30 years ago she thinks.  Patient went to her neurologist and complained of headaches and right eye visual disturbances ongoing for a few days.  There was concern for papilledema.  She was sent over to her ophthalmologist who was concerned by the ophthalmological examination and sent the patient to the emergency department.  She was seen by neurology.  Underwent LP.  Started on high-dose steroids.  See below for details.  Consultants: Neurology  Procedures: Lumbar puncture  Antibiotics: Anti-infectives (From admission, onward)   None      Subjective/Interval History: Patient denies any headache but continues to have blurry vision out of her right eye.  Otherwise she feels well.  Looking forward to ambulating in the hallway today.      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  MRI was done in the emergency department.  Diffuse moderate pachymeningeal thickening and enhancement was noted.  There was also suggestion of papilledema in the right eye.   Patient was seen by inpatient neurology.  LP was done.  Normal opening pressures were noted.  There is concern for temporal arteritis.  Patient started on high-dose steroids by neurology.  Headache seems to be better.  Continues to have blurry vision out of the right eye.  Neurology has been discussing with her ophthalmologist.  Patient has an appointment with the Dr. Manson Passey with ophthalmology on Thursday afternoon. Patient was started on high-dose steroids initially for 3 days and extended to 5 days.  Last dose will be tomorrow.  Plan is to repeat  another MRI brain tomorrow. Mobilize.  Patient to ambulate in the hallway today.  Discussed with nursing staff.  Diabetes mellitus type 2 Reports that her blood glucose levels and A1c has been very well controlled with her last A1c being around 5.9.  She is on Lantus and NovoLog at home along with Ozempic.  Also appears to be on metformin.   Elevated glucose level due to steroids.  An additional dose of Lantus is being given in the morning time in addition to her nightly Lantus dosage.  Continue SSI as well.  Essential hypertension Continue benazepril.  Blood pressure noted to be elevated over the last 24 to 48 hours most likely due to steroids.  Continue to monitor for now.  Should improve as steroid course is completed.  Severe obesity Estimated body mass index is 48.71 kg/m as calculated from the following:   Height as of this encounter: 5\' 3"  (1.6 m).   Weight as of this encounter: 124.7 kg.    DVT Prophylaxis: Lovenox Code Status: Full code Family Communication: Discussed with patient.  No family at bedside Disposition Plan: Hopefully return home when improved.  Status is: Inpatient  Remains inpatient appropriate because:IV treatments appropriate due to intensity of illness or inability to take PO   Dispo:  Patient From: Home  Planned Disposition: Home  Medically stable for discharge: No         Medications:  Scheduled: . benazepril  20 mg Oral Daily  . enoxaparin (LOVENOX) injection  40 mg Subcutaneous Q24H  . ezetimibe  10 mg Oral QHS   And  .  simvastatin  40 mg Oral QHS  . fenofibrate  160 mg Oral Daily  . FLUoxetine  60 mg Oral Daily  . insulin aspart  0-20 Units Subcutaneous TID WC  . insulin aspart  6 Units Subcutaneous TID WC  . insulin glargine  30 Units Subcutaneous Daily  . insulin glargine  60 Units Subcutaneous QHS  . metFORMIN  1,700 mg Oral Q supper  . metFORMIN  850 mg Oral Q breakfast  . pantoprazole  40 mg Oral Daily  . saccharomyces  boulardii  250 mg Oral BID   Continuous: . methylPREDNISolone (SOLU-MEDROL) injection     ZWC:HENIDPOEUMPNT **OR** acetaminophen, loperamide, LORazepam, ondansetron **OR** ondansetron (ZOFRAN) IV   Objective:  Vital Signs  Vitals:   03/02/21 0755 03/02/21 1500 03/02/21 2020 03/03/21 0554  BP: (!) 158/63 (!) 162/74 (!) 146/62 (!) 156/72  Pulse: 77 74 74 75  Resp: 17 18 16 18   Temp: 97.7 F (36.5 C) 97.7 F (36.5 C) 99 F (37.2 C) 98.7 F (37.1 C)  TempSrc: Oral Oral Oral Oral  SpO2: 94% 95% 98% 95%  Weight:      Height:        Intake/Output Summary (Last 24 hours) at 03/03/2021 1225 Last data filed at 03/03/2021 1100 Gross per 24 hour  Intake 480 ml  Output --  Net 480 ml   Filed Weights   02/27/21 0311  Weight: 124.7 kg    General appearance: Awake alert.  In no distress Resp: Clear to auscultation bilaterally.  Normal effort Cardio: S1-S2 is normal regular.  No S3-S4.  No rubs murmurs or bruit GI: Abdomen is soft.  Nontender nondistended.  Bowel sounds are present normal.  No masses organomegaly Extremities: No edema.  Full range of motion of lower extremities. Neurologic: Alert and oriented x3.  No focal neurological deficits.      Lab Results:  Data Reviewed: I have personally reviewed following labs and imaging studies  CBC: Recent Labs  Lab 02/26/21 1610 02/28/21 0159 03/01/21 0052 03/02/21 0351 03/03/21 0437  WBC 10.4 7.2 6.7 8.2 8.4  NEUTROABS 7.2  --   --   --   --   HGB 14.2 12.2 13.4 12.9 12.9  HCT 46.6* 39.2 42.9 40.5 40.6  MCV 88.3 86.9 87.6 86.5 86.2  PLT 272 195 183 196 196    Basic Metabolic Panel: Recent Labs  Lab 02/26/21 1610 02/28/21 0159 03/01/21 0052 03/02/21 0351 03/03/21 0437  NA 136 141 138 139 138  K 4.5 4.6 4.9 4.8 4.5  CL 99 103 102 102 101  CO2 27 29 26 26 25   GLUCOSE 138* 142* 263* 264* 262*  BUN 21 20 17  24* 31*  CREATININE 1.08* 1.04* 0.98 0.98 1.03*  CALCIUM 9.0 9.0 9.1 9.2 8.8*    GFR: Estimated  Creatinine Clearance: 65.2 mL/min (A) (by C-G formula based on SCr of 1.03 mg/dL (H)).  CBG: Recent Labs  Lab 03/02/21 1140 03/02/21 1524 03/02/21 2106 03/03/21 0644 03/03/21 1133  GLUCAP 261* 216* 118* 207* 276*     Recent Results (from the past 240 hour(s))  Resp Panel by RT-PCR (Flu A&B, Covid) Nasopharyngeal Swab     Status: None   Collection Time: 02/27/21  3:13 AM   Specimen: Nasopharyngeal Swab; Nasopharyngeal(NP) swabs in vial transport medium  Result Value Ref Range Status   SARS Coronavirus 2 by RT PCR NEGATIVE NEGATIVE Final    Comment: (NOTE) SARS-CoV-2 target nucleic acids are NOT DETECTED.  The SARS-CoV-2 RNA is generally  detectable in upper respiratory specimens during the acute phase of infection. The lowest concentration of SARS-CoV-2 viral copies this assay can detect is 138 copies/mL. A negative result does not preclude SARS-Cov-2 infection and should not be used as the sole basis for treatment or other patient management decisions. A negative result may occur with  improper specimen collection/handling, submission of specimen other than nasopharyngeal swab, presence of viral mutation(s) within the areas targeted by this assay, and inadequate number of viral copies(<138 copies/mL). A negative result must be combined with clinical observations, patient history, and epidemiological information. The expected result is Negative.  Fact Sheet for Patients:  BloggerCourse.com  Fact Sheet for Healthcare Providers:  SeriousBroker.it  This test is no t yet approved or cleared by the [place] FDA and  has been authorized for detection and/or diagnosis of SARS-CoV-2 by FDA under an Emergency Use Authorization (EUA). This EUA will remain  in effect (meaning this test can be used) for the duration of the COVID-19 declaration under Section 564(b)(1) of the Act, 21 U.S.C.section 360bbb-3(b)(1), unless the  authorization is terminated  or revoked sooner.       Influenza A by PCR NEGATIVE NEGATIVE Final   Influenza B by PCR NEGATIVE NEGATIVE Final    Comment: (NOTE) The Xpert Xpress SARS-CoV-2/FLU/RSV plus assay is intended as an aid in the diagnosis of influenza from Nasopharyngeal swab specimens and should not be used as a sole basis for treatment. Nasal washings and aspirates are unacceptable for Xpert Xpress SARS-CoV-2/FLU/RSV testing.  Fact Sheet for Patients: BloggerCourse.com  Fact Sheet for Healthcare Providers: SeriousBroker.it  This test is not yet approved or cleared by the [place] FDA and has been authorized for detection and/or diagnosis of SARS-CoV-2 by FDA under an Emergency Use Authorization (EUA). This EUA will remain in effect (meaning this test can be used) for the duration of the COVID-19 declaration under Section 564(b)(1) of the Act, 21 U.S.C. section 360bbb-3(b)(1), unless the authorization is terminated or revoked.  Performed at [facility], [address], [place], [place] [address]   CSF culture w Gram Stain     Status: None   Collection Time: 02/27/21  9:30 AM   Specimen: PATH Cytology CSF; Cerebrospinal Fluid  Result Value Ref Range Status   Specimen Description CSF  Final   Special Requests NONE  Final   Gram Stain   Final    RARE WBC PRESENT, PREDOMINANTLY PMN NO ORGANISMS SEEN CYTOSPIN SMEAR    Culture   Final    NO GROWTH 3 DAYS Performed at [facility], [address], [place], [place] [address]    Report Status 03/02/2021 FINAL  Final  Culture, fungus without smear     Status: None (Preliminary result)   Collection Time: 02/27/21  9:30 AM   Specimen: PATH Cytology CSF; Cerebrospinal Fluid  Result Value Ref Range Status   Specimen Description CSF  Final   Special Requests NONE  Final   Culture   Final    NO FUNGUS ISOLATED AFTER 3 DAYS Performed at [facility], [address] [place], [place] [address]    Report Status PENDING  Incomplete      Radiology Studies: No results found.     LOS: 3 days   Ankit Degregorio [employer] on [email or web address]  03/03/2021, 12:25 PM

## 2021-03-03 NOTE — Plan of Care (Signed)
  Problem: Education: Goal: Knowledge of General Education information will improve Description: Including pain rating scale, medication(s)/side effects and non-pharmacologic comfort measures Outcome: Progressing   Problem: Health Behavior/Discharge Planning: Goal: Ability to manage health-related needs will improve Outcome: Progressing   Problem: Clinical Measurements: Goal: Diagnostic test results will improve Outcome: Progressing   

## 2021-03-04 ENCOUNTER — Inpatient Hospital Stay (HOSPITAL_COMMUNITY): Payer: Medicare Other

## 2021-03-04 LAB — CBC
HCT: 42.2 % (ref 36.0–46.0)
Hemoglobin: 13.1 g/dL (ref 12.0–15.0)
MCH: 27.1 pg (ref 26.0–34.0)
MCHC: 31 g/dL (ref 30.0–36.0)
MCV: 87.2 fL (ref 80.0–100.0)
Platelets: 207 10*3/uL (ref 150–400)
RBC: 4.84 MIL/uL (ref 3.87–5.11)
RDW: 14.2 % (ref 11.5–15.5)
WBC: 7.5 10*3/uL (ref 4.0–10.5)
nRBC: 0 % (ref 0.0–0.2)

## 2021-03-04 LAB — BASIC METABOLIC PANEL
Anion gap: 8 (ref 5–15)
BUN: 33 mg/dL — ABNORMAL HIGH (ref 8–23)
CO2: 30 mmol/L (ref 22–32)
Calcium: 9 mg/dL (ref 8.9–10.3)
Chloride: 102 mmol/L (ref 98–111)
Creatinine, Ser: 1.06 mg/dL — ABNORMAL HIGH (ref 0.44–1.00)
GFR, Estimated: 57 mL/min — ABNORMAL LOW (ref >60)
Glucose, Bld: 135 mg/dL — ABNORMAL HIGH (ref 70–99)
Potassium: 4.7 mmol/L (ref 3.5–5.1)
Sodium: 140 mmol/L (ref 135–145)

## 2021-03-04 LAB — MPO/PR-3 (ANCA) ANTIBODIES
ANCA Proteinase 3: <3.5 U/mL (ref 0.0–3.5)
Myeloperoxidase Abs: <9 U/mL (ref 0.0–9.0)

## 2021-03-04 LAB — GLUCOSE, CAPILLARY: Glucose-Capillary: 276 mg/dL — ABNORMAL HIGH (ref 70–99)

## 2021-03-04 IMAGING — MR MR MRV HEAD WO/W CM
11 of 19 series · 14 of 48 positions shown · IV contrast (Yes GAD)
Comparison: MRI head with MRV head [DATE]. MRI brain
[DATE].

CLINICAL DATA: Headache, papilledema.

EXAM:
MRI HEAD WITHOUT AND WITH CONTRAST
MRV HEAD WITHOUT AND WITH CONTRAST
TECHNIQUE: Multiplanar, multiecho pulse sequences of the brain and surrounding
structures were obtained without and with intravenous contrast.
Angiographic images of the intracranial venous structures were
obtained using MRV technique without and with intravenous contrast.
CONTRAST:  10mL GADAVIST GADOBUTROL 1 MMOL/ML IV SOLN

[Series 2: DWI · axial · 3.0mm · 1.02mm/px · z∈[-155,-18]mm · 2 of 94 slices shown (1 of 2)]
[im 1/94]
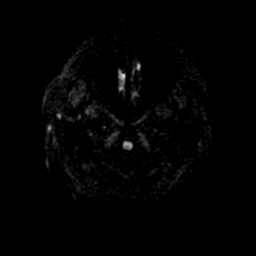
[im 94/94]
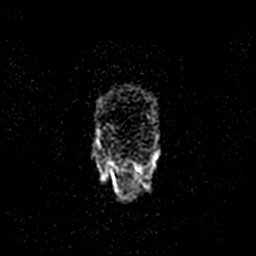

[Series 3: DWI · coronal · 5.0mm · 1.02mm/px · 1 of 64 slices shown (2 of 2)]
[im 1/64]
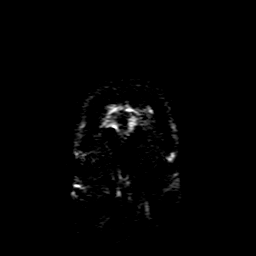

[Series 4: MRV · coronal · 1.5mm · 0.43mm/px · 3 of 119 slices shown]
[im 1/119]
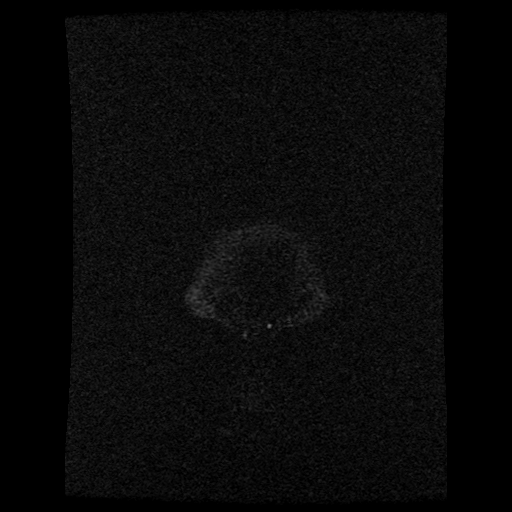
[im 60/119]
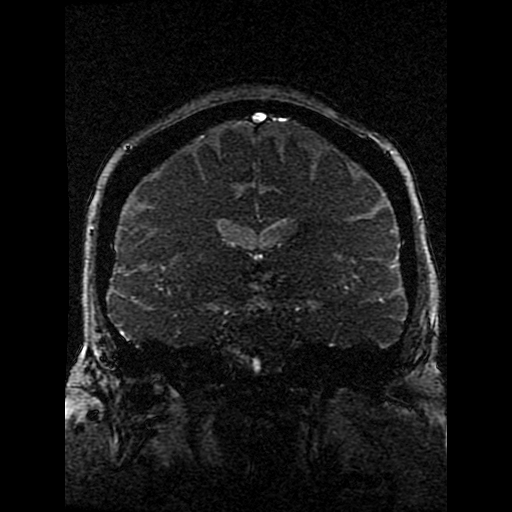
[im 119/119]
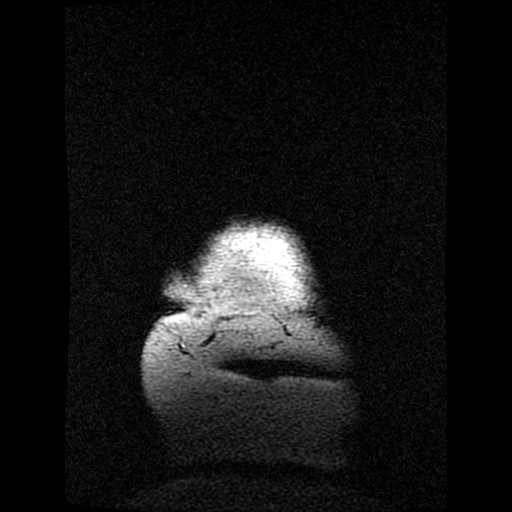

[Series 6: FLAIR · sagittal · 5.0mm · 0.23mm/px · 1 of 24 slices shown (1 of 2)]
[im 1/24]
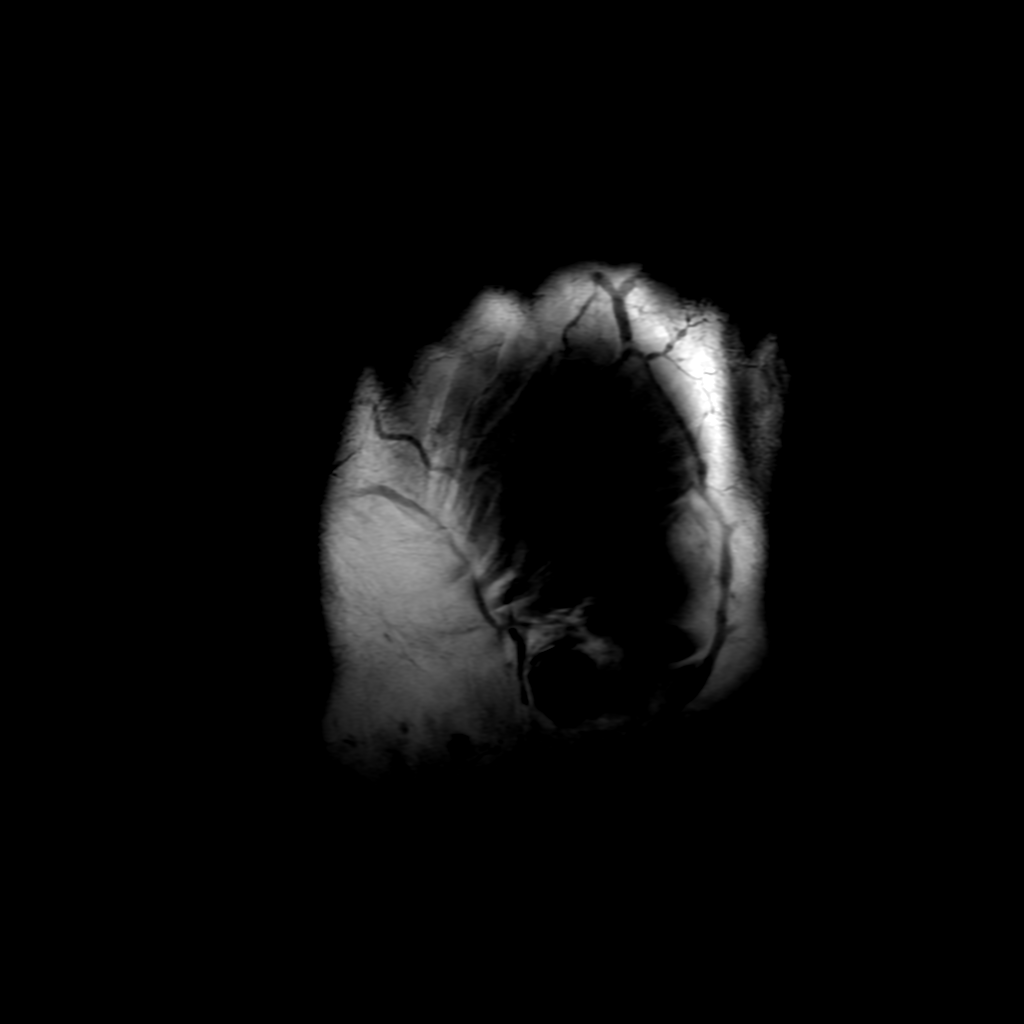

[Series 7: T2 · axial · 5.0mm · 0.23mm/px · 1 of 24 slices shown]
[im 1/24]
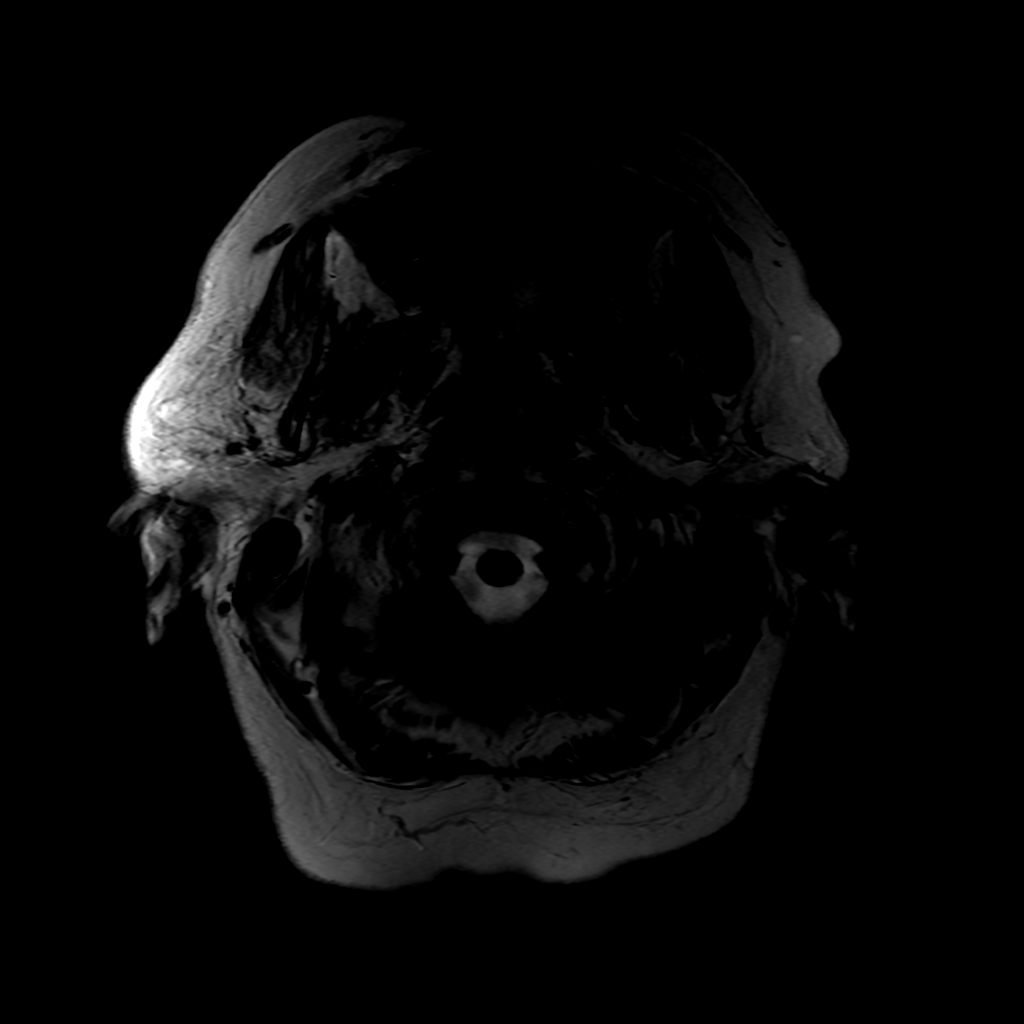

[Series 8: FLAIR · axial · 3.0mm · 0.41mm/px · 1 of 24 slices shown (2 of 2)]
[im 1/24]
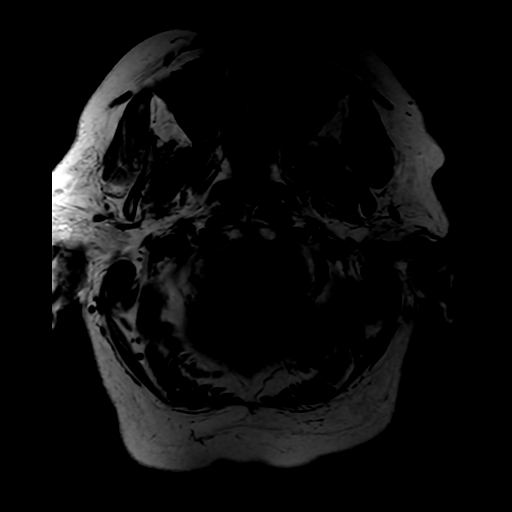

[Series 11: T2 post-contrast · coronal · 5.0mm · 0.20mm/px · 1 of 27 slices shown]
[im 1/27]
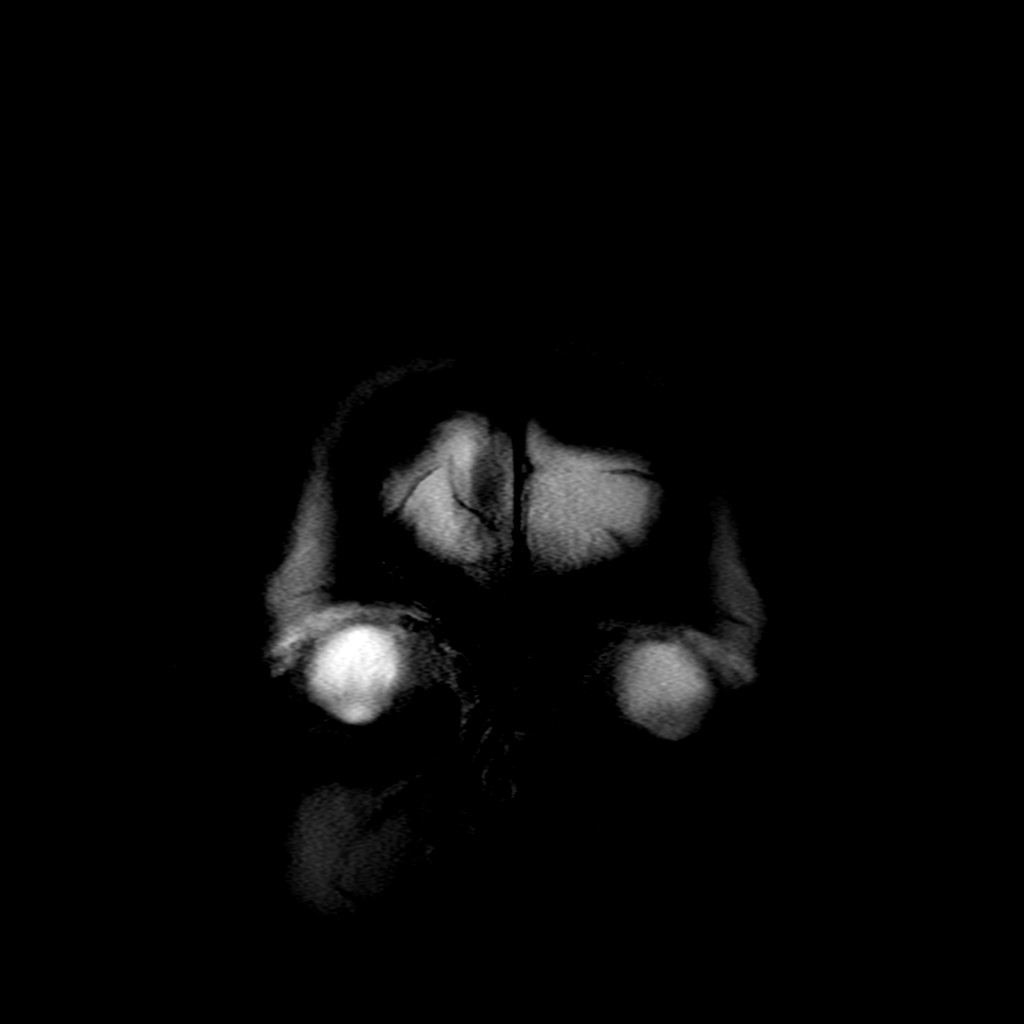

[Series 13: T1 · axial · 3.0mm · 0.94mm/px · 1 of 48 slices shown (1 of 2)]
[im 1/48]
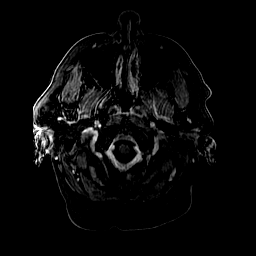

[Series 14: T1 · coronal · 5.0mm · 0.43mm/px · 1 of 28 slices shown (2 of 2)]
[im 1/28]
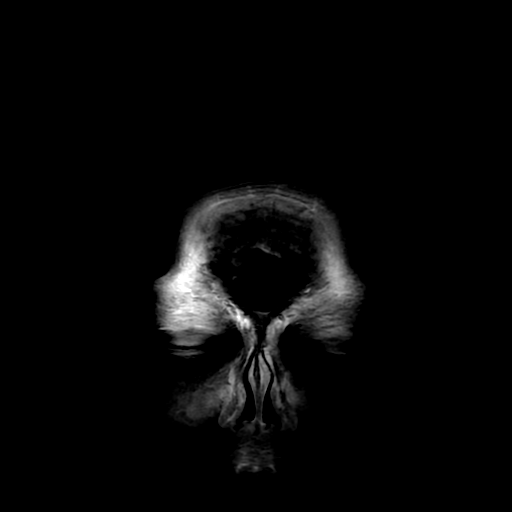

[Series 250: ADC · axial · 3.0mm · 1.02mm/px · 1 of 47 slices shown (1 of 2)]
[im 1/47]
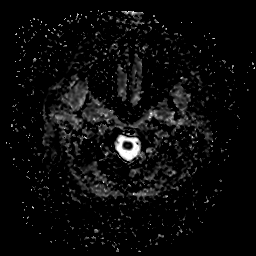

[Series 350: ADC · coronal · 5.0mm · 1.02mm/px · 1 of 32 slices shown (2 of 2)]
[im 1/32]
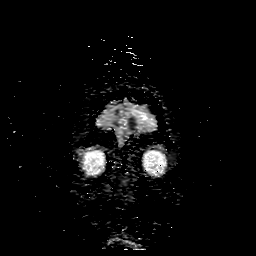

[14 of 48 positions shown; findings below may reference images not displayed]

FINDINGS: MRI brain:

Brain:

Mild cerebral and cerebellar atrophy.

Mild multifocal T2/FLAIR hyperintensity within the cerebral white
matter is nonspecific, but compatible with chronic small vessel
ischemic disease.

Similar to the prior MRI of [DATE], there is diffuse smooth
pachymeningeal dural thickening enhancement overlying both cerebral
hemispheres.

There is no acute infarct.

No evidence of intracranial mass.

No chronic intracranial blood products.

No extra-axial fluid collection.

No midline shift.

Partially empty sella turcica.

Vascular: Expected proximal arterial flow voids.

Skull and upper cervical spine: No focal marrow lesion.

Sinuses/Orbits: Visualized orbits show no acute finding. Mild
bilateral ethmoid sinus mucosal thickening.

MRV head:

The superior sagittal sinus, internal cerebral veins, vein of AUJLA,
straight sinus, transverse sinuses, sigmoid sinuses and visualized
jugular veins are patent. There is no appreciable intracranial
venous thrombosis. No convincing dural venous sinus stenosis.
Hypoplastic left transverse and sigmoid dural venous sinuses.
IMPRESSION: MRI brain:

1. Diffuse smooth pachymeningeal dural thickening enhancement
overlying both cerebral hemispheres, similar as compared to the
recent prior brain MRI of [DATE]. This finding is nonspecific
and could be related to prior intervention. However, an
infectious/inflammatory process is also a differential
consideration. Correlate with findings on recent CSF analysis.
Intracranial hypotension can also produce this imaging appearance.
However, this seems unlikely given the provided history of
papilledema and the opening pressure of 8 cm water on recent prior
lumbar puncture.
2. Partially empty sella turcica. This finding is very commonly
incidental, but can be associated with idiopathic intracranial
hypertension (pseudotumor cerebri). However, an opening pressure of
8 cm water is noted on the recent prior lumbar puncture of
[DATE].
3. Otherwise stable MRI appearance of the brain.
4. Mild generalized parenchymal atrophy and cerebral white matter
chronic small vessel ischemic disease.

MRV head:

1. Unremarkable intracranial MRV. No evidence of dural venous sinus
thrombosis or stenosis.

## 2021-03-04 MED ORDER — GADOBUTROL 1 MMOL/ML IV SOLN
10.0000 mL | Freq: Once | INTRAVENOUS | Status: AC | PRN
  Administered 2021-03-04: 10 mL via INTRAVENOUS

## 2021-03-04 MED ORDER — SODIUM CHLORIDE 0.9 % IV SOLN
1000.0000 mg | INTRAVENOUS | Status: AC
  Administered 2021-03-04: 1000 mg via INTRAVENOUS
  Filled 2021-03-04: qty 8

## 2021-03-04 NOTE — Discharge Summary (Signed)
Physician Discharge Summary  Andrea Marks KJZ:[id number] DOB: 06-27-1951 DOA: 02/26/2021  PCP: [name], DO  Admit date: 02/26/2021 Discharge date: 03/04/2021  Admitted From: Home  Disposition: Home   Recommendations for Outpatient Follow-up:  1. Follow up with PCP in 1-2 weeks 2. Please obtain BMP/CBC in one week 3. Needs to follow up with ophthalmology and neurology out patient.  4. Follow up: MPO titer, Quantiferon, VDRL CSF,   Home Health: None  Discharge Condition: Stable.  CODE STATUS: Full code Diet recommendation: Carb Modified  Brief/Interim Summary: 70 y.o.femalewith medical history significant ofDM2, HTN, HLD, morbid obesity. Pt has h/o Pseudotumor cerebri ~30 years ago she thinks.  Patient went to her neurologist and complained of headaches and right eye visual disturbances ongoing for a few days.  There was concern for papilledema.  She was sent over to her ophthalmologist who was concerned by the ophthalmological examination and sent the patient to the emergency department.  She was seen by neurology.  Underwent LP.  Started on high-dose steroids.  See below for details.   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  MRI was done in the emergency department.  Diffuse moderate pachymeningeal thickening and enhancement was noted.  There was also suggestion of papilledema in the right eye.   Patient was seen by inpatient neurology.  LP was done.  Normal opening pressures were noted.  -Patient received High dose IV steroids.  -Per neurology most likely etiology for the papilledema is a pachy meningeal inflammatory process with possible associated thickening of the dural sleeve of the optic nerve.  Sharp black nerve granulomatosis versus osteophytic granulomatosis, TB, carcinomatous meningitis. -MPO titer,  Quantiferon  and pending. -Needs to follow-up with  neurology as an outpatient may need repeat LP for cytology and flow cytology for possible carcinomatous meningitis. -ACE level low. RF less than 10. ESR 13, less concern with temporal arteritis.  -plan to discharge patient today, she has appointment schedule with Dr Owens Shark ophthalmology  -MRI repeated and stable.   Diabetes mellitus type 2 Reports that her blood glucose levels and A1c has been very well controlled with her last A1c being around 5.9.  She is on Lantus and NovoLog at home along with Ozempic.   Continue metformin.  She received extra dose of lantus in AM while on steroids.  She will be discharge off steroids. Plan to resume home regimen.  She received a dose of lantus this am. To resume home lantus tonight.   Essential hypertension Continue benazepril.  BP elevated due to steroids.  Needs close follow up.   Severe obesity: Needs life style modifications.  Estimated body mass index is 48.71 kg/m as calculated from the following:   Height as of this encounter: $RemoveBeforeD'5\' 3"'[id number]$  (1.6 m).   Weight as of this encounter: 124.7 kg.   Discharge Diagnoses:  Principal Problem:   Papilledema Active Problems:   Essential hypertension   Type 2 diabetes mellitus with hyperglycemia, with long-term current use of insulin (HCC)   Morbid obesity (HCC)   Vision loss of right eye   Nonintractable headache    Discharge Instructions  Discharge Instructions    Diet - low sodium heart healthy   Complete by: As directed    Discharge wound care:   Complete by: As directed    No wound care   Increase activity slowly   Complete by: As directed  Allergies as of 03/04/2021      Reactions   Hydrocodone-acetaminophen Other (See Comments)   unknown   Oxycodone Hcl Other (See Comments)   unknown   Penicillins Other (See Comments)   unknown   Prednisone    Patient has noted allergy to prednisone but she says it was a questionable history of mild nausea with prednisone and nothing more       Medication List    TAKE these medications   acetaminophen 650 MG CR tablet Commonly known as: TYLENOL Take 650 mg by mouth 2 (two) times daily as needed for pain. For pain   aspirin 81 MG tablet Take 81 mg by mouth daily.   Basaglar KwikPen 100 UNIT/ML Inject 60 Units into the skin at bedtime.   benazepril 20 MG tablet Commonly known as: LOTENSIN TAKE 1 TABLET DAILY   estradiol 1 MG tablet Commonly known as: ESTRACE Take 1 tablet (1 mg total) by mouth daily.   ezetimibe-simvastatin 10-40 MG tablet Commonly known as: VYTORIN TAKE 1 TABLET AT BEDTIME   fenofibrate 160 MG tablet TAKE 1 TABLET DAILY   FLUoxetine 20 MG tablet Commonly known as: PROZAC Take 3 tablets (60 mg total) by mouth daily.   FreeStyle Libre 14 Day Reader Kerrin Mo 1 Device by Does not apply route See admin instructions. Use for continuous monitor glucose   FreeStyle Libre 14 Day Sensor Misc Apply new sensor every 14 days for continuous glucose monitoring.   metFORMIN 850 MG tablet Commonly known as: GLUCOPHAGE Take 1 tablet in the AM, and 2 tablets in the PM.   NovoLOG FlexPen 100 UNIT/ML FlexPen Generic drug: insulin aspart Inject 18-24 Units into the skin 3 (three) times daily with meals.   ONE TOUCH ULTRA SYSTEM KIT w/Device Kit 1 kit by Does not apply route once.   OneTouch Ultra test strip Generic drug: glucose blood Use to check blood sugar 3 times a day   Ozempic (1 MG/DOSE) 4 MG/3ML Sopn Generic drug: Semaglutide (1 MG/DOSE) INJECT $RemoveBeforeD'1MG'TyWyyuTqanSVVh$  SUBCUTANEOUSLY  ONCE A WEEK.   pantoprazole 40 MG tablet Commonly known as: PROTONIX Take 1 tablet (40 mg total) by mouth daily. Pt due for office visit   Pen Needles 32G X 4 MM Misc USE AS INSTRUCTED WITH LANCETS.            Discharge Care Instructions  (From admission, onward)         Start     Ordered   03/04/21 0000  Discharge wound care:       Comments: No wound care   03/04/21 1436          Allergies  Allergen Reactions   . Hydrocodone-Acetaminophen Other (See Comments)    unknown  . Oxycodone Hcl Other (See Comments)    unknown  . Penicillins Other (See Comments)    unknown  . Prednisone     Patient has noted allergy to prednisone but she says it was a questionable history of mild nausea with prednisone and nothing more     Consultations:  Neurology    Procedures/Studies: MR Brain Wo Contrast (neuro protocol)  Result Date: 02/26/2021 CLINICAL DATA:  Rapidly progressive optic disc edema EXAM: MRI HEAD WITHOUT CONTRAST TECHNIQUE: Multiplanar, multiecho pulse sequences of the brain and surrounding structures were obtained without intravenous contrast. COMPARISON:  None. FINDINGS: Mild motion artifact is present. Brain: There is no acute infarction or intracranial hemorrhage. There is no intracranial mass, mass effect, or edema. There is no hydrocephalus or extra-axial  fluid collection. Prominence of the ventricles and sulci reflects generalized parenchymal volume loss. Patchy foci of T2 hyperintensity in the supratentorial white matter nonspecific but may reflect mild chronic microvascular ischemic changes. Vascular: Major vessel flow voids at the skull base are preserved. Skull and upper cervical spine: Normal marrow signal is preserved. Sinuses/Orbits: Paranasal sinuses are aerated. No significant orbital abnormality on this nondedicated study. Other: Sella is unremarkable.  Mastoid air cells are clear. IMPRESSION: No evidence of recent infarction, hemorrhage, or mass. Mild chronic microvascular ischemic changes. Electronically Signed   By: Macy Mis M.D.   On: 02/26/2021 18:36   MR BRAIN W CONTRAST  Result Date: 02/27/2021 CLINICAL DATA:  Initial evaluation for acute visual loss. EXAM: MRI HEAD WITH CONTRAST MRV HEAD WITHOUT AND WITH CONTRAST TECHNIQUE: Multiplanar, multiecho pulse sequences of the brain and surrounding structures were obtained with intravenous contrast. Angiographic images of the  intracranial venous structures were obtained using MRV technique without intravenous contrast. COMPARISON:  Prior noncontrast MRI from earlier the same day. CONTRAST:  43mL GADAVIST GADOBUTROL 1 MMOL/ML IV SOLN FINDINGS: MR HEAD FINDINGS Brain: Postcontrast only images of the brain were performed. Sequences demonstrate diffuse smooth pachymeningeal enhancement, fairly symmetric in nature overlying both cerebral hemispheres. This was likely present on prior noncontrast brain MRI from earlier the same day. Otherwise, no other abnormal enhancement seen within the brain. No mass lesion, mass effect or midline shift. No hydrocephalus or extra-axial fluid collection. Pituitary gland and suprasellar region normal. Midline structures intact and normal. No other findings to suggest intracranial hypotension. Vascular: Normal intravascular enhancement seen throughout the brain. Skull and upper cervical spine: Craniocervical junction within normal limits. Bone marrow signal grossly within normal limits. No scalp soft tissue abnormality. Sinuses/Orbits: Subtle bulging of the optic nerve disc at the posterior right globe, suggesting papilledema (series 15, image 16). No appreciable bulging of the optic disc at the left globe. Prior bilateral ocular lens replacement. Globes and orbital soft tissues demonstrate no other acute finding. Mild scattered mucosal thickening noted within the ethmoidal air cells. Paranasal sinuses are otherwise clear. No mastoid effusion. Inner ear structures grossly normal. Other: None. MRV HEAD FINDINGS Normal flow related signal and enhancement seen throughout the superior sagittal sinus to the torcula. Torcula itself appears patent. Transverse and sigmoid sinuses are patent as are the visualized proximal internal jugular veins. Right transverse sinus dominant, with a diffusely hypoplastic left transverse and sigmoid sinus. Straight sinus, vein of Galen, internal cerebral veins, and basal veins of  Rosenthal appear patent. No evidence for dural sinus thrombosis. No appreciable dural sinus stenosis. No findings to suggest cavernous sinus thrombosis. IMPRESSION: 1. Diffuse smooth pachymeningeal thickening and enhancement overlying both cerebral hemispheres. Finding is of uncertain etiology or significance, and could be related to prior intervention. A possible infectious or inflammatory process would be the primary differential consideration. Correlation with CSF analysis recommended as clinically warranted. 2. Subtle bulging of the optic nerve disc at the posterior right globe, suggesting papilledema. 3. No other abnormal enhancement within the brain. 4. Normal intracranial MRV. No evidence for dural sinus thrombosis, stenosis, or other abnormality. Electronically Signed   By: Jeannine Boga M.D.   On: 02/27/2021 02:23   MR BRAIN W WO CONTRAST  Result Date: 03/04/2021 CLINICAL DATA:  Headache, papilledema. EXAM: MRI HEAD WITHOUT AND WITH CONTRAST MRV HEAD WITHOUT AND WITH CONTRAST TECHNIQUE: Multiplanar, multiecho pulse sequences of the brain and surrounding structures were obtained without and with intravenous contrast. Angiographic images of the intracranial venous  structures were obtained using MRV technique without and with intravenous contrast. CONTRAST:  67mL GADAVIST GADOBUTROL 1 MMOL/ML IV SOLN COMPARISON:  MRI head with MRV head 02/27/2021. MRI brain 02/26/2021. FINDINGS: MRI brain: Brain: Mild cerebral and cerebellar atrophy. Mild multifocal T2/FLAIR hyperintensity within the cerebral white matter is nonspecific, but compatible with chronic small vessel ischemic disease. Similar to the prior MRI of 02/27/2021, there is diffuse smooth pachymeningeal dural thickening enhancement overlying both cerebral hemispheres. There is no acute infarct. No evidence of intracranial mass. No chronic intracranial blood products. No extra-axial fluid collection. No midline shift. Partially empty sella turcica.  Vascular: Expected proximal arterial flow voids. Skull and upper cervical spine: No focal marrow lesion. Sinuses/Orbits: Visualized orbits show no acute finding. Mild bilateral ethmoid sinus mucosal thickening. MRV head: The superior sagittal sinus, internal cerebral veins, vein of Galen, straight sinus, transverse sinuses, sigmoid sinuses and visualized jugular veins are patent. There is no appreciable intracranial venous thrombosis. No convincing dural venous sinus stenosis. Hypoplastic left transverse and sigmoid dural venous sinuses. IMPRESSION: MRI brain: 1. Diffuse smooth pachymeningeal dural thickening enhancement overlying both cerebral hemispheres, similar as compared to the recent prior brain MRI of 02/27/2021. This finding is nonspecific and could be related to prior intervention. However, an infectious/inflammatory process is also a differential consideration. Correlate with findings on recent CSF analysis. Intracranial hypotension can also produce this imaging appearance. However, this seems unlikely given the provided history of papilledema and the opening pressure of 8 cm water on recent prior lumbar puncture. 2. Partially empty sella turcica. This finding is very commonly incidental, but can be associated with idiopathic intracranial hypertension (pseudotumor cerebri). However, an opening pressure of 8 cm water is noted on the recent prior lumbar puncture of 02/27/2021. 3. Otherwise stable MRI appearance of the brain. 4. Mild generalized parenchymal atrophy and cerebral white matter chronic small vessel ischemic disease. MRV head: 1. Unremarkable intracranial MRV. No evidence of dural venous sinus thrombosis or stenosis. Electronically Signed   By: Kellie Simmering DO   On: 03/04/2021 11:17   MR MRV HEAD W WO CONTRAST  Result Date: 03/04/2021 CLINICAL DATA:  Headache, papilledema. EXAM: MRI HEAD WITHOUT AND WITH CONTRAST MRV HEAD WITHOUT AND WITH CONTRAST TECHNIQUE: Multiplanar, multiecho pulse  sequences of the brain and surrounding structures were obtained without and with intravenous contrast. Angiographic images of the intracranial venous structures were obtained using MRV technique without and with intravenous contrast. CONTRAST:  79mL GADAVIST GADOBUTROL 1 MMOL/ML IV SOLN COMPARISON:  MRI head with MRV head 02/27/2021. MRI brain 02/26/2021. FINDINGS: MRI brain: Brain: Mild cerebral and cerebellar atrophy. Mild multifocal T2/FLAIR hyperintensity within the cerebral white matter is nonspecific, but compatible with chronic small vessel ischemic disease. Similar to the prior MRI of 02/27/2021, there is diffuse smooth pachymeningeal dural thickening enhancement overlying both cerebral hemispheres. There is no acute infarct. No evidence of intracranial mass. No chronic intracranial blood products. No extra-axial fluid collection. No midline shift. Partially empty sella turcica. Vascular: Expected proximal arterial flow voids. Skull and upper cervical spine: No focal marrow lesion. Sinuses/Orbits: Visualized orbits show no acute finding. Mild bilateral ethmoid sinus mucosal thickening. MRV head: The superior sagittal sinus, internal cerebral veins, vein of Galen, straight sinus, transverse sinuses, sigmoid sinuses and visualized jugular veins are patent. There is no appreciable intracranial venous thrombosis. No convincing dural venous sinus stenosis. Hypoplastic left transverse and sigmoid dural venous sinuses. IMPRESSION: MRI brain: 1. Diffuse smooth pachymeningeal dural thickening enhancement overlying both cerebral hemispheres, similar as compared  to the recent prior brain MRI of 02/27/2021. This finding is nonspecific and could be related to prior intervention. However, an infectious/inflammatory process is also a differential consideration. Correlate with findings on recent CSF analysis. Intracranial hypotension can also produce this imaging appearance. However, this seems unlikely given the provided  history of papilledema and the opening pressure of 8 cm water on recent prior lumbar puncture. 2. Partially empty sella turcica. This finding is very commonly incidental, but can be associated with idiopathic intracranial hypertension (pseudotumor cerebri). However, an opening pressure of 8 cm water is noted on the recent prior lumbar puncture of 02/27/2021. 3. Otherwise stable MRI appearance of the brain. 4. Mild generalized parenchymal atrophy and cerebral white matter chronic small vessel ischemic disease. MRV head: 1. Unremarkable intracranial MRV. No evidence of dural venous sinus thrombosis or stenosis. Electronically Signed   By: Kellie Simmering DO   On: 03/04/2021 11:17   MR MRV HEAD W WO CONTRAST  Result Date: 02/27/2021 CLINICAL DATA:  Initial evaluation for acute visual loss. EXAM: MRI HEAD WITH CONTRAST MRV HEAD WITHOUT AND WITH CONTRAST TECHNIQUE: Multiplanar, multiecho pulse sequences of the brain and surrounding structures were obtained with intravenous contrast. Angiographic images of the intracranial venous structures were obtained using MRV technique without intravenous contrast. COMPARISON:  Prior noncontrast MRI from earlier the same day. CONTRAST:  60mL GADAVIST GADOBUTROL 1 MMOL/ML IV SOLN FINDINGS: MR HEAD FINDINGS Brain: Postcontrast only images of the brain were performed. Sequences demonstrate diffuse smooth pachymeningeal enhancement, fairly symmetric in nature overlying both cerebral hemispheres. This was likely present on prior noncontrast brain MRI from earlier the same day. Otherwise, no other abnormal enhancement seen within the brain. No mass lesion, mass effect or midline shift. No hydrocephalus or extra-axial fluid collection. Pituitary gland and suprasellar region normal. Midline structures intact and normal. No other findings to suggest intracranial hypotension. Vascular: Normal intravascular enhancement seen throughout the brain. Skull and upper cervical spine: Craniocervical  junction within normal limits. Bone marrow signal grossly within normal limits. No scalp soft tissue abnormality. Sinuses/Orbits: Subtle bulging of the optic nerve disc at the posterior right globe, suggesting papilledema (series 15, image 16). No appreciable bulging of the optic disc at the left globe. Prior bilateral ocular lens replacement. Globes and orbital soft tissues demonstrate no other acute finding. Mild scattered mucosal thickening noted within the ethmoidal air cells. Paranasal sinuses are otherwise clear. No mastoid effusion. Inner ear structures grossly normal. Other: None. MRV HEAD FINDINGS Normal flow related signal and enhancement seen throughout the superior sagittal sinus to the torcula. Torcula itself appears patent. Transverse and sigmoid sinuses are patent as are the visualized proximal internal jugular veins. Right transverse sinus dominant, with a diffusely hypoplastic left transverse and sigmoid sinus. Straight sinus, vein of Galen, internal cerebral veins, and basal veins of Rosenthal appear patent. No evidence for dural sinus thrombosis. No appreciable dural sinus stenosis. No findings to suggest cavernous sinus thrombosis. IMPRESSION: 1. Diffuse smooth pachymeningeal thickening and enhancement overlying both cerebral hemispheres. Finding is of uncertain etiology or significance, and could be related to prior intervention. A possible infectious or inflammatory process would be the primary differential consideration. Correlation with CSF analysis recommended as clinically warranted. 2. Subtle bulging of the optic nerve disc at the posterior right globe, suggesting papilledema. 3. No other abnormal enhancement within the brain. 4. Normal intracranial MRV. No evidence for dural sinus thrombosis, stenosis, or other abnormality. Electronically Signed   By: Jeannine Boga M.D.   On: 02/27/2021  02:23   DG FL GUIDED LUMBAR PUNCTURE  Result Date: 02/27/2021 CLINICAL DATA:  Abnormal MRI  EXAM: DIAGNOSTIC LUMBAR PUNCTURE UNDER FLUOROSCOPIC GUIDANCE COMPARISON:  None FLUOROSCOPY TIME:  Fluoroscopy Time:  54 seconds Radiation Exposure Index (if provided by the fluoroscopic device): 83.7 mGy Number of Acquired Spot Images: 2 PROCEDURE: Informed consent was obtained from the patient prior to the procedure, including potential complications of headache, allergy, and pain. With the patient prone, the lower back was prepped with Betadine. 1% Lidocaine was used for local anesthesia. Lumbar puncture was attempted at the L3-L4 level using a 20 gauge needle. There was no return of CSF. Needle was advanced into the disc space and then retracted without return of CSF. A new 20 gauge needle was then attempted again at the L3-L4 level with slow return clear CSF with an opening pressure of 8 cm water. 5 ml of CSF were obtained for laboratory studies. The patient tolerated the procedure well and there were no apparent complications. IMPRESSION: Technically successful fluoroscopic guided lumbar puncture. Electronically Signed   By: Macy Mis M.D.   On: 02/27/2021 10:05     Subjective: She feels vision is stable, difficulty with reading.   Discharge Exam: Vitals:   03/04/21 0624 03/04/21 1408  BP: (!) 153/76 (!) 156/72  Pulse: 66 84  Resp: 16 17  Temp: 97.8 F (36.6 C) 98.2 F (36.8 C)  SpO2: 93% (!) 80%     General: Pt is alert, awake, not in acute distress Cardiovascular: RRR, S1/S2 +, no rubs, no gallops Respiratory: CTA bilaterally, no wheezing, no rhonchi Abdominal: Soft, NT, ND, bowel sounds + Extremities: no edema, no cyanosis    The results of significant diagnostics from this hospitalization (including imaging, microbiology, ancillary and laboratory) are listed below for reference.     Microbiology: Recent Results (from the past 240 hour(s))  Resp Panel by RT-PCR (Flu A&B, Covid) Nasopharyngeal Swab     Status: None   Collection Time: 02/27/21  3:13 AM   Specimen:  Nasopharyngeal Swab; Nasopharyngeal(NP) swabs in vial transport medium  Result Value Ref Range Status   SARS Coronavirus 2 by RT PCR NEGATIVE NEGATIVE Final    Comment: (NOTE) SARS-CoV-2 target nucleic acids are NOT DETECTED.  The SARS-CoV-2 RNA is generally detectable in upper respiratory specimens during the acute phase of infection. The lowest concentration of SARS-CoV-2 viral copies this assay can detect is 138 copies/mL. A negative result does not preclude SARS-Cov-2 infection and should not be used as the sole basis for treatment or other patient management decisions. A negative result may occur with  improper specimen collection/handling, submission of specimen other than nasopharyngeal swab, presence of viral mutation(s) within the areas targeted by this assay, and inadequate number of viral copies(<138 copies/mL). A negative result must be combined with clinical observations, patient history, and epidemiological information. The expected result is Negative.  Fact Sheet for Patients:  EntrepreneurPulse.com.au  Fact Sheet for Healthcare Providers:  IncredibleEmployment.be  This test is no t yet approved or cleared by the [place] FDA and  has been authorized for detection and/or diagnosis of SARS-CoV-2 by FDA under an Emergency Use Authorization (EUA). This EUA will remain  in effect (meaning this test can be used) for the duration of the COVID-19 declaration under Section 564(b)(1) of the Act, 21 U.S.C.section 360bbb-3(b)(1), unless the authorization is terminated  or revoked sooner.       Influenza A by PCR NEGATIVE NEGATIVE Final   Influenza B by PCR NEGATIVE  NEGATIVE Final    Comment: (NOTE) The Xpert Xpress SARS-CoV-2/FLU/RSV plus assay is intended as an aid in the diagnosis of influenza from Nasopharyngeal swab specimens and should not be used as a sole basis for treatment. Nasal washings and aspirates are unacceptable for  Xpert Xpress SARS-CoV-2/FLU/RSV testing.  Fact Sheet for Patients: EntrepreneurPulse.com.au  Fact Sheet for Healthcare Providers: IncredibleEmployment.be  This test is not yet approved or cleared by the [place] FDA and has been authorized for detection and/or diagnosis of SARS-CoV-2 by FDA under an Emergency Use Authorization (EUA). This EUA will remain in effect (meaning this test can be used) for the duration of the COVID-19 declaration under Section 564(b)(1) of the Act, 21 U.S.C. section 360bbb-3(b)(1), unless the authorization is terminated or revoked.  Performed at [facility], [place] [address], [place], [place] [address]   CSF culture w Gram Stain     Status: None   Collection Time: 02/27/21  9:30 AM   Specimen: PATH Cytology CSF; Cerebrospinal Fluid  Result Value Ref Range Status   Specimen Description CSF  Final   Special Requests NONE  Final   Gram Stain   Final    RARE WBC PRESENT, PREDOMINANTLY PMN NO ORGANISMS SEEN CYTOSPIN SMEAR    Culture   Final    NO GROWTH 3 DAYS Performed at [facility], [place] [address], [place], [place] [address]    Report Status 03/02/2021 FINAL  Final  Culture, fungus without smear     Status: None (Preliminary result)   Collection Time: 02/27/21  9:30 AM   Specimen: PATH Cytology CSF; Cerebrospinal Fluid  Result Value Ref Range Status   Specimen Description CSF  Final   Special Requests NONE  Final   Culture   Final    NO FUNGUS ISOLATED AFTER 3 DAYS Performed at [facility], [place] [address] [place],  [address]    Report Status PENDING  Incomplete     Labs: BNP (last 3 results) No results for input(s): BNP in the last 8760 hours. Basic Metabolic Panel: Recent Labs  Lab 02/28/21 0159 03/01/21 0052 03/02/21 0351 03/03/21 0437 03/04/21 0025  NA 141 138 139 138 140  K 4.6 4.9 4.8 4.5 4.7  CL 103 102 102 101 102  CO2 $Re'29 26 26 25 30  'MUv$ GLUCOSE 142* 263*  264* 262* 135*  BUN 20 17 24* 31* 33*  CREATININE 1.04* 0.98 0.98 1.03* 1.06*  CALCIUM 9.0 9.1 9.2 8.8* 9.0   Liver Function Tests: No results for input(s): AST, ALT, ALKPHOS, BILITOT, PROT, ALBUMIN in the last 168 hours. No results for input(s): LIPASE, AMYLASE in the last 168 hours. No results for input(s): AMMONIA in the last 168 hours. CBC: Recent Labs  Lab 02/26/21 1610 02/28/21 0159 03/01/21 0052 03/02/21 0351 03/03/21 0437 03/04/21 0025  WBC 10.4 7.2 6.7 8.2 8.4 7.5  NEUTROABS 7.2  --   --   --   --   --   HGB 14.2 12.2 13.4 12.9 12.9 13.1  HCT 46.6* 39.2 42.9 40.5 40.6 42.2  MCV 88.3 86.9 87.6 86.5 86.2 87.2  PLT 272 195 183 196 196 207   Cardiac Enzymes: No results for input(s): CKTOTAL, CKMB, CKMBINDEX, TROPONINI in the last 168 hours. BNP: Invalid input(s): POCBNP CBG: Recent Labs  Lab 03/03/21 0644 03/03/21 1133 03/03/21 1602 03/03/21 2051 03/04/21 1127  GLUCAP 207* 276* 143* 152* 276*   D-Dimer No results for input(s): DDIMER in the last 72 hours.  Hgb A1c No results for input(s): HGBA1C in the last 72 hours. Lipid Profile No results for input(s): CHOL, HDL, LDLCALC, TRIG, CHOLHDL, LDLDIRECT in the last 72 hours. Thyroid function studies No results for input(s): TSH, T4TOTAL, T3FREE, THYROIDAB in the last 72 hours.  Invalid input(s): FREET3 Anemia work up No results for input(s): VITAMINB12, FOLATE, FERRITIN, TIBC, IRON, RETICCTPCT in the last 72 hours. Urinalysis    Component Value Date/Time   COLORURINE YELLOW 11/15/2016 1732   APPEARANCEUR CLOUDY (A) 11/15/2016 1732   LABSPEC 1.039 (H) 11/15/2016 1732   PHURINE 5.5 11/15/2016 1732   GLUCOSEU >=500 (A) 11/15/2016 1732   HGBUR NEGATIVE 11/15/2016 1732   HGBUR negative 11/23/2010 0908   BILIRUBINUR neg 12/19/2017 1157   KETONESUR 15 (A) 11/15/2016 1732   PROTEINUR neg 12/19/2017 1157   PROTEINUR 30 (A) 11/15/2016 1732   UROBILINOGEN 0.2 12/19/2017 1157   UROBILINOGEN 0.2 11/23/2010 0908    NITRITE neg 12/19/2017 1157   NITRITE NEGATIVE 11/15/2016 1732   LEUKOCYTESUR Negative 12/19/2017 1157   Sepsis Labs Invalid input(s): PROCALCITONIN,  WBC,  LACTICIDVEN Microbiology Recent Results (from the past 240 hour(s))  Resp Panel by RT-PCR (Flu A&B, Covid) Nasopharyngeal Swab     Status: None   Collection Time: 02/27/21  3:13 AM   Specimen: Nasopharyngeal Swab; Nasopharyngeal(NP) swabs in vial transport medium  Result Value Ref Range Status   SARS Coronavirus 2 by RT PCR NEGATIVE NEGATIVE Final    Comment: (NOTE) SARS-CoV-2 target nucleic acids are NOT DETECTED.  The SARS-CoV-2 RNA is generally detectable in upper respiratory specimens during the acute phase of infection. The lowest concentration of SARS-CoV-2 viral copies this assay can detect is 138 copies/mL. A negative result does not preclude SARS-Cov-2 infection and should not be used as the sole basis for treatment or other patient management decisions. A negative result may occur with  improper specimen collection/handling, submission of specimen other than nasopharyngeal swab, presence of viral mutation(s) within the areas targeted by this assay, and inadequate number of viral copies(<138 copies/mL). A negative result must be combined with clinical observations, patient history, and epidemiological information. The expected result is Negative.  Fact Sheet for Patients:  EntrepreneurPulse.com.au  Fact Sheet for Healthcare Providers:  IncredibleEmployment.be  This test is no t yet approved or cleared by the [place] FDA and  has been authorized for detection and/or diagnosis of SARS-CoV-2 by FDA under an Emergency Use Authorization (EUA). This EUA will remain  in effect (meaning this test can be used) for the duration of the COVID-19 declaration under Section 564(b)(1) of the Act, 21 U.S.C.section 360bbb-3(b)(1), unless the authorization is terminated  or revoked sooner.        Influenza A by PCR NEGATIVE NEGATIVE Final   Influenza B by PCR NEGATIVE NEGATIVE Final    Comment: (NOTE) The Xpert Xpress SARS-CoV-2/FLU/RSV plus assay is intended as an aid in the diagnosis of influenza from Nasopharyngeal swab specimens and should not be used as a sole basis for treatment. Nasal washings and aspirates are unacceptable for Xpert Xpress SARS-CoV-2/FLU/RSV testing.  Fact Sheet for Patients: EntrepreneurPulse.com.au  Fact Sheet for Healthcare Providers: IncredibleEmployment.be  This test is not yet approved or cleared by the [place] FDA and has been authorized for detection and/or diagnosis of SARS-CoV-2 by FDA under an Emergency Use Authorization (EUA). This EUA will remain in effect (meaning this test can be used) for the duration of the COVID-19 declaration under Section 564(b)(1) of the Act, 21 U.S.C. section 360bbb-3(b)(1), unless  the authorization is terminated or revoked.  Performed at [facility], [place] [address] [place], [place] [address]   CSF culture w Gram Stain     Status: None   Collection Time: 02/27/21  9:30 AM   Specimen: PATH Cytology CSF; Cerebrospinal Fluid  Result Value Ref Range Status   Specimen Description CSF  Final   Special Requests NONE  Final   Gram Stain   Final    RARE WBC PRESENT, PREDOMINANTLY PMN NO ORGANISMS SEEN CYTOSPIN SMEAR    Culture   Final    NO GROWTH 3 DAYS Performed at [facility], [place] [address] [place], [place] [address]    Report Status 03/02/2021 FINAL  Final  Culture, fungus without smear     Status: None (Preliminary result)   Collection Time: 02/27/21  9:30 AM   Specimen: PATH Cytology CSF; Cerebrospinal Fluid  Result Value Ref Range Status   Specimen Description CSF  Final   Special Requests NONE  Final   Culture   Final    NO FUNGUS ISOLATED AFTER 3 DAYS Performed at [facility], [place] [address], [place], [place] [address]     Report Status PENDING  Incomplete     Time coordinating discharge: 40 minutes  SIGNED:   Elmarie Shiley, MD  [facility]

## 2021-03-04 NOTE — Progress Notes (Signed)
Patient discharged home. Instructions given. All concerns addressed.

## 2021-03-04 NOTE — Care Management Important Message (Signed)
Important Message  Patient Details  Name: Andrea Marks MRN: [id number] Date of Birth: 02-Oct-1951   Medicare Important Message Given:  Yes     Kolin Erdahl P Joette Schmoker 03/04/2021, 11:48 AM

## 2021-03-04 NOTE — Progress Notes (Addendum)
Neurology Progress Note   S:// No new neurological events overnight. Patient is sitting up in the chair, in NAD. Overall, she states that her vision is improving. Still having visual deficit with the right eye, has impairment seeing out of the right half of the right eye.  She is able to see the 9 and 10 on the wall clock but having difficulty with the rest of the numbers.  Right temporal headache is better. Patient is ready to go home. She requests to be discharged prior to dark due to her vision. She has concerns with managing her diabetes at home due to the steroids and is requesting some help with management   O:// Current vital signs: BP (!) 153/76 (BP Location: Left Arm)   Pulse 66   Temp 97.8 F (36.6 C) (Oral)   Resp 16   Ht $R'5\' 3"'Dv$  (1.6 m)   Wt 124.7 kg   SpO2 93%   BMI 48.71 kg/m  Vital signs in last 24 hours: Temp:  [97.7 F (36.5 C)-98 F (36.7 C)] 97.8 F (36.6 C) (05/04 0624) Pulse Rate:  [66-75] 66 (05/04 0624) Resp:  [16-18] 16 (05/04 0624) BP: (153-159)/(66-76) 153/76 (05/04 0624) SpO2:  [93 %-95 %] 93 % (05/04 0624)   GENERAL: Awake, alert in NAD HEENT: - Normocephalic and atraumatic, dry mm.  LUNGS - Clear to auscultation bilaterally with no wheezes CV - S1S2 RRR, no m/r/g, equal pulses bilaterally. ABDOMEN - Soft, nontender, nondistended with normoactive BS Ext: warm, well perfused, intact peripheral pulses, no  Edema Psychiatric- Normal mood and affect   NEURO:  Mental Status: AA&Ox4 Language: speech is normal.  Naming, repetition, fluency, and comprehension intact. Cranial Nerves: PERRL 24mm/brisk.  EOM intact  visual fields Right field cut in right eye only  no facial asymmetry facial sensation intact hearing intact  tongue/uvula/soft palate midline,  normal sternocleidomastoid and trapezius muscle strength.  No evidence of tongue atrophy or fibrillations Motor: 4/5 in all 4 extremities  Tone: is normal and bulk is normal Sensation- Intact to  light touch bilaterally Coordination: FTN intact bilaterally, no ataxia in BLE. Gait- deferred  Medications  Current Facility-Administered Medications:  .  acetaminophen (TYLENOL) tablet 650 mg, 650 mg, Oral, Q6H PRN, 650 mg at 02/28/21 0429 **OR** acetaminophen (TYLENOL) suppository 650 mg, 650 mg, Rectal, Q6H PRN, Etta Quill, DO .  benazepril (LOTENSIN) tablet 20 mg, 20 mg, Oral, Daily, Alcario Drought, Jared M, DO, 20 mg at 03/04/21 0825 .  enoxaparin (LOVENOX) injection 40 mg, 40 mg, Subcutaneous, Q24H, Bonnielee Haff, MD, 40 mg at 03/04/21 0823 .  ezetimibe (ZETIA) tablet 10 mg, 10 mg, Oral, QHS, 10 mg at 03/03/21 2054 **AND** simvastatin (ZOCOR) tablet 40 mg, 40 mg, Oral, QHS, Gardner, Jared M, DO, 40 mg at 03/03/21 2055 .  fenofibrate tablet 160 mg, 160 mg, Oral, Daily, Etta Quill, DO, 160 mg at 03/04/21 8469 .  FLUoxetine (PROZAC) capsule 60 mg, 60 mg, Oral, Daily, Etta Quill, DO, 60 mg at 03/04/21 0824 .  insulin aspart (novoLOG) injection 0-20 Units, 0-20 Units, Subcutaneous, TID WC, Etta Quill, DO, 11 Units at 03/04/21 1215 .  insulin aspart (novoLOG) injection 6 Units, 6 Units, Subcutaneous, TID WC, Etta Quill, DO, 6 Units at 03/04/21 1214 .  insulin glargine (LANTUS) injection 30 Units, 30 Units, Subcutaneous, Daily, Bonnielee Haff, MD, 30 Units at 03/04/21 [phone] .  insulin glargine (LANTUS) injection 60 Units, 60 Units, Subcutaneous, QHS, Etta Quill, DO, 60 Units at 03/03/21  2055 .  loperamide (IMODIUM) capsule 4 mg, 4 mg, Oral, TID PRN, Bonnielee Haff, MD .  LORazepam (ATIVAN) injection 0.5 mg, 0.5 mg, Intravenous, Once PRN, Quentin Cornwall, [place] N, PA-C .  metFORMIN (GLUCOPHAGE) tablet 1,700 mg, 1,700 mg, Oral, Q supper, Alcario Drought, Jared M, DO, 1,700 mg at 03/03/21 1758 .  metFORMIN (GLUCOPHAGE) tablet 850 mg, 850 mg, Oral, Q breakfast, Etta Quill, DO, 850 mg at 03/04/21 4742 .  methylPREDNISolone sodium succinate (SOLU-MEDROL) 1,000 mg in sodium  chloride 0.9 % 50 mL IVPB, 1,000 mg, Intravenous, Q24H, Regalado, Belkys A, MD, Last Rate: 58 mL/hr at 03/04/21 1216, 1,000 mg at 03/04/21 1216 .  ondansetron (ZOFRAN) tablet 4 mg, 4 mg, Oral, Q6H PRN **OR** ondansetron (ZOFRAN) injection 4 mg, 4 mg, Intravenous, Q6H PRN, Alcario Drought, Jared M, DO .  pantoprazole (PROTONIX) EC tablet 40 mg, 40 mg, Oral, Daily, Jennette Kettle M, DO, 40 mg at 03/04/21 0825 .  saccharomyces boulardii (FLORASTOR) capsule 250 mg, 250 mg, Oral, BID, Bonnielee Haff, MD, 250 mg at 03/04/21 0825   Labs  CBC    Component Value Date/Time   WBC 7.5 03/04/2021 0025   RBC 4.84 03/04/2021 0025   HGB 13.1 03/04/2021 0025   HCT 42.2 03/04/2021 0025   PLT 207 03/04/2021 0025   MCV 87.2 03/04/2021 0025   MCH 27.1 03/04/2021 0025   MCHC 31.0 03/04/2021 0025   RDW 14.2 03/04/2021 0025   LYMPHSABS 2.2 02/26/2021 1610   MONOABS 0.7 02/26/2021 1610   EOSABS 0.1 02/26/2021 1610   BASOSABS 0.1 02/26/2021 1610    CMP     Component Value Date/Time   NA 140 03/04/2021 0025   K 4.7 03/04/2021 0025   CL 102 03/04/2021 0025   CO2 30 03/04/2021 0025   GLUCOSE 135 (H) 03/04/2021 0025   BUN 33 (H) 03/04/2021 0025   CREATININE 1.06 (H) 03/04/2021 0025   CREATININE 0.89 07/12/2014 1550   CALCIUM 9.0 03/04/2021 0025   PROT 6.0 01/01/2021 1030   ALBUMIN 3.7 01/01/2021 1030   AST 13 01/01/2021 1030   ALT 12 01/01/2021 1030   ALKPHOS 26 (L) 01/01/2021 1030   BILITOT 0.4 01/01/2021 1030   GFRNONAA 57 (L) 03/04/2021 0025   GFRAA >60 11/15/2016 1433    glycosylated hemoglobin  Lipid Panel     Component Value Date/Time   CHOL 98 01/01/2021 1030   TRIG 147.0 01/01/2021 1030   HDL 48.70 01/01/2021 1030   CHOLHDL 2 01/01/2021 1030   VLDL 29.4 01/01/2021 1030   LDLCALC 20 01/01/2021 1030   LDLDIRECT 64.0 04/21/2017 1639     Imaging I have reviewed images in epic and the results pertinent to this consultation are:  MRI examination of the brain: Similar to the prior MRI  of 02/27/2021, 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  Assessment: Patient p/w R temporal headache with R temporal visual field deficit, pachymeningeal enhancement on MRI, acute exacerbation of chronic R papilledema. R VF deficit developed 4 days prior to arrival. O/p ophtho noted decreased visual acuity OU compared to 2 weeks prior with bilateral papilledema on examination.  - LP revealed opening pressure of 8 cm H2O, militating against pseudotumor cerebri - CSF with normal protein level and no pleiocytosis. - ESR, CRP WNL - Treated with pulsed dose steroids x 5 days per consensus agreement of best initial course of treatment based on telephone discussion between Neurology and her Ophthalmologist on Monday - Differential diagnosis:  - ACE  normal, which makes neurosarcoidosis less likely, and neurosarcoidosis tends to present with nodular multifocal enhancement of the meninges rather than diffuse dural thickening. She also has no pulmonary symptoms.   - MRV head negative for venous sinus thrombosis  - GCA unlikely given normal inflammatory markers, no history of temporal region tenderness, no jaw claudication and no myalgias.  - Most likely etiology for the papilledema is a pachymeningeal inflammatory process with possible associated thickening of the dural sleeves of the optic nerves  - Wegener granulomatosis is possible as it can present with diffuse dural thickening. However, the patient is without sinus or URI symptoms.   - Eosinophilic granulomatosis with polyangitis (EGPA aka Churg-Strauss syndrome) can also present with diffuse pachymeningeal thickening  - Dural thickening and headache can occur with low CSF pressure; however, opening pressure with LP was normal  - TB can present with diffuse dural thickening and cranial neuropathies.   - Carcinomatous meningitis is on the DDx as well - Received 3 days of solumedrol $RemoveBefor'500mg'[id number]$   and 2 days of $Remov'1000mg'uPvSwm$  solumedrol for a total of 5 days at the recommendation of the outpt ophthalmologist. - MRI Brain today similar to prior scan.   Recommendations: - Patient to be discharged to home today  - To follow up with Ophthalmology tomorrow - May need repeat high-volume LP for cytology  - Will need repeat MRI brain in 3 months - Outpatient Neurology follow up - The patient expressed understanding and agreement with the plan  Beulah Gandy, ACNPC-AG  Electronically signed: Dr. Kerney Elbe

## 2021-03-04 NOTE — Plan of Care (Signed)
Patient in good spirit. Waiting on discharge orders. Solumedrol IV given. Denies needs. Will continue to monitor. Problem: Education: Goal: Knowledge of General Education information will improve Description: Including pain rating scale, medication(s)/side effects and non-pharmacologic comfort measures Outcome: Progressing   Problem: Health Behavior/Discharge Planning: Goal: Ability to manage health-related needs will improve Outcome: Progressing

## 2021-03-05 ENCOUNTER — Telehealth: Payer: Self-pay

## 2021-03-05 ENCOUNTER — Encounter: Payer: Self-pay | Admitting: Internal Medicine

## 2021-03-05 LAB — QUANTIFERON-TB GOLD PLUS (RQFGPL)
QuantiFERON Mitogen Value: 5.18 IU/mL
QuantiFERON Nil Value: 0 IU/mL
QuantiFERON TB1 Ag Value: 0 IU/mL
QuantiFERON TB2 Ag Value: 0 IU/mL

## 2021-03-05 LAB — VDRL, CSF: VDRL Quant, CSF: NONREACTIVE

## 2021-03-05 LAB — QUANTIFERON-TB GOLD PLUS: QuantiFERON-TB Gold Plus: NEGATIVE

## 2021-03-05 NOTE — Telephone Encounter (Signed)
Transition Care Management Follow-up Telephone Call  Date of discharge and from where: 03/04/21-[place]  How have you been since you were released from the hospital? A little tired but doing ok. Still can't see close up out of right eye. Appt with Dr. Cathey Endow today at 3:15.  Any questions or concerns? No  Items Reviewed:  Did the pt receive and understand the discharge instructions provided? Yes   Medications obtained and verified? Yes   Other? Yes   Any new allergies since your discharge? No   Dietary orders reviewed? Yes  Do you have support at home? Yes   Home Care and Equipment/Supplies: Were home health services ordered? no If so, what is the name of the agency? n/a  Has the agency set up a time to come to the patient's home? not applicable Were any new equipment or medical supplies ordered?  No What is the name of the medical supply agency? n/a Were you able to get the supplies/equipment? not applicable Do you have any questions related to the use of the equipment or supplies? n/a  Functional Questionnaire: (I = Independent and D = Dependent) ADLs: I  Bathing/Dressing- I  Meal Prep- I  Eating- I  Maintaining continence- I  Transferring/Ambulation- I  Managing Meds- I  Follow up appointments reviewed:   PCP Hospital f/u appt confirmed? Yes  Scheduled to see Dr Laury Axon on 03/13/21 @ 11:00.  Specialist Hospital f/u appt confirmed? Yes  Scheduled to see Dr. Cathey Endow on 03/05/21 @ 3:15.  Are transportation arrangements needed? No   If their condition worsens, is the pt aware to call PCP or go to the Emergency Dept.? Yes  Was the patient provided with contact information for the PCP's office or ED? Yes  Was to pt encouraged to call back with questions or concerns? Yes

## 2021-03-06 ENCOUNTER — Ambulatory Visit: Payer: Medicare Other | Admitting: Family Medicine

## 2021-03-06 ENCOUNTER — Encounter: Payer: Self-pay | Admitting: Adult Health

## 2021-03-09 ENCOUNTER — Telehealth: Payer: Self-pay | Admitting: Emergency Medicine

## 2021-03-09 NOTE — Telephone Encounter (Signed)
Called and offered patient 04/22/21 @ 1600 with arrival time of 1530.  She acknowledged arrival time and accepted appointment.   Patient denied further questions, verbalized understanding and expressed appreciation for the phone call.

## 2021-03-09 NOTE — Telephone Encounter (Signed)
-----   Message from York Spaniel, MD sent at 03/09/2021 11:32 AM EDT ----- No, unusual case, I will need to see. ----- Message ----- From: Lenise Arena, RN Sent: 03/09/2021  10:55 AM EDT To: York Spaniel, MD  Can she follow up with the NP or does it need to be you?   ----- Message ----- From: York Spaniel, MD Sent: 03/09/2021   9:26 AM EDT To: Lenise Arena, RN  This patient will need to be seen soon in the office, sometime in the next 2 to 4 weeks.

## 2021-03-13 ENCOUNTER — Inpatient Hospital Stay: Payer: Medicare Other | Admitting: Family Medicine

## 2021-03-18 ENCOUNTER — Other Ambulatory Visit: Payer: Self-pay

## 2021-03-18 ENCOUNTER — Ambulatory Visit
Admission: RE | Admit: 2021-03-18 | Discharge: 2021-03-18 | Disposition: A | Discharge status: Home / Self Care | Payer: Medicare Other | Source: Ambulatory Visit | Attending: Neurology | Admitting: Neurology

## 2021-03-18 DIAGNOSIS — G4489 Other headache syndrome: Secondary | ICD-10-CM

## 2021-03-19 ENCOUNTER — Ambulatory Visit (INDEPENDENT_AMBULATORY_CARE_PROVIDER_SITE_OTHER): Payer: Medicare Other | Admitting: Family Medicine

## 2021-03-19 ENCOUNTER — Other Ambulatory Visit: Payer: Self-pay

## 2021-03-19 ENCOUNTER — Encounter: Payer: Self-pay | Admitting: Family Medicine

## 2021-03-19 ENCOUNTER — Telehealth: Payer: Self-pay | Admitting: Neurology

## 2021-03-19 ENCOUNTER — Ambulatory Visit: Payer: Medicare Other | Attending: Internal Medicine

## 2021-03-19 VITALS — BP 144/80 | HR 88 | Temp 99.1°F | Resp 20 | Ht 63.0 in | Wt 274.4 lb

## 2021-03-19 DIAGNOSIS — Z23 Encounter for immunization: Secondary | ICD-10-CM

## 2021-03-19 DIAGNOSIS — E1169 Type 2 diabetes mellitus with other specified complication: Secondary | ICD-10-CM

## 2021-03-19 DIAGNOSIS — E1165 Type 2 diabetes mellitus with hyperglycemia: Secondary | ICD-10-CM

## 2021-03-19 DIAGNOSIS — E785 Hyperlipidemia, unspecified: Secondary | ICD-10-CM

## 2021-03-19 DIAGNOSIS — F321 Major depressive disorder, single episode, moderate: Secondary | ICD-10-CM

## 2021-03-19 DIAGNOSIS — K219 Gastro-esophageal reflux disease without esophagitis: Secondary | ICD-10-CM | POA: Diagnosis not present

## 2021-03-19 DIAGNOSIS — I10 Essential (primary) hypertension: Secondary | ICD-10-CM

## 2021-03-19 DIAGNOSIS — H4710 Unspecified papilledema: Secondary | ICD-10-CM | POA: Diagnosis not present

## 2021-03-19 DIAGNOSIS — Z794 Long term (current) use of insulin: Secondary | ICD-10-CM

## 2021-03-19 DIAGNOSIS — F418 Other specified anxiety disorders: Secondary | ICD-10-CM | POA: Diagnosis not present

## 2021-03-19 LAB — CBC WITH DIFFERENTIAL/PLATELET
Basophils Absolute: 0.1 10*3/uL (ref 0.0–0.1)
Basophils Relative: 1.1 % (ref 0.0–3.0)
Eosinophils Absolute: 0.2 10*3/uL (ref 0.0–0.7)
Eosinophils Relative: 1.5 % (ref 0.0–5.0)
HCT: 41.9 % (ref 36.0–46.0)
Hemoglobin: 14 g/dL (ref 12.0–15.0)
Lymphocytes Relative: 19.5 % (ref 12.0–46.0)
Lymphs Abs: 1.9 10*3/uL (ref 0.7–4.0)
MCHC: 33.4 g/dL (ref 30.0–36.0)
MCV: 83.2 fl (ref 78.0–100.0)
Monocytes Absolute: 0.5 10*3/uL (ref 0.1–1.0)
Monocytes Relative: 4.9 % (ref 3.0–12.0)
Neutro Abs: 7.2 10*3/uL (ref 1.4–7.7)
Neutrophils Relative %: 73 % (ref 43.0–77.0)
Platelets: 172 10*3/uL (ref 150.0–400.0)
RBC: 5.03 Mil/uL (ref 3.87–5.11)
RDW: 15.3 % (ref 11.5–15.5)
WBC: 9.9 10*3/uL (ref 4.0–10.5)

## 2021-03-19 LAB — BASIC METABOLIC PANEL
BUN: 16 mg/dL (ref 6–23)
CO2: 30 mEq/L (ref 19–32)
Calcium: 9.2 mg/dL (ref 8.4–10.5)
Chloride: 103 mEq/L (ref 96–112)
Creatinine, Ser: 0.79 mg/dL (ref 0.40–1.20)
GFR: 75.97 mL/min (ref >60.00)
Glucose, Bld: 138 mg/dL — ABNORMAL HIGH (ref 70–99)
Potassium: 4.7 mEq/L (ref 3.5–5.1)
Sodium: 140 mEq/L (ref 135–145)

## 2021-03-19 MED ORDER — PANTOPRAZOLE SODIUM 40 MG PO TBEC: 40.0000 mg | DELAYED_RELEASE_TABLET | Freq: Every day | ORAL | 3 refills | Status: DC

## 2021-03-19 MED ORDER — FLUOXETINE HCL 20 MG PO TABS: 60.0000 mg | ORAL_TABLET | Freq: Every day | ORAL | 3 refills | Status: DC

## 2021-03-19 NOTE — Assessment & Plan Note (Signed)
F/u neuro/ opth

## 2021-03-19 NOTE — Telephone Encounter (Signed)
Dr Sinda Du @ [facility] is asking for a call from Dr Anne Hahn on Monday, he can be reached at [phone]

## 2021-03-19 NOTE — Assessment & Plan Note (Signed)
Tolerating statin, encouraged heart healthy diet, avoid trans fats, minimize simple carbs and saturated fats. Increase exercise as tolerated 

## 2021-03-19 NOTE — Progress Notes (Signed)
Subjective:   By signing my name below, I, Shehryar Baig, attest that this documentation has been prepared under the direction and in the presence of Dr. Seabron Spates, DO. 03/19/2021      Patient ID: Andrea Marks, female    DOB: Nov 12, 1950, 70 y.o.   MRN: [id number]  Chief Complaint  Patient presents with  . Hospitalization Follow-up    HPI Patient is in today for a hospital follow up. She complains of issues seeing after her cataracts procedure. She has more visual issues with her right eye. She was seeing another physician to manage her issues but has only found mild relief. She has further follow ups with other providers to manage her issues. She has elevated stress due to her procedures and visual issues. She drove to get to her appointment. She does not drive at night or far distances. She is requesting a refill on 40 mg pantoprazole daily PO and 20 mg Prozac daily PO. She is willing to get her 4th Covid-19 vaccine booster.    Past Medical History:  Diagnosis Date  . Depression   . Diabetes mellitus   . GERD (gastroesophageal reflux disease)   . Hyperlipidemia   . Hypertension   . Obesity     Past Surgical History:  Procedure Laterality Date  . ABDOMINAL HYSTERECTOMY  1991   BSO    Family History  Problem Relation Age of Onset  . Macular degeneration Mother   . Heart disease Mother        syncope  . Aortic stenosis Mother   . Lung disease Father 48       mesothelioma  . Cancer Maternal Aunt        stomach  . Heart disease Paternal Uncle        cabg  . Diabetes Paternal Uncle   . Heart disease Paternal Uncle   . Diabetes Paternal Uncle   . Diabetes Paternal Uncle   . Heart disease Paternal Uncle   . Heart disease Paternal Uncle   . Heart disease Paternal Uncle   . Lung cancer Other        asbestos  . Diabetes Paternal Grandmother   . Cancer Other        lung    Social History   Socioeconomic History  . Marital status: Married     Spouse name: Not on file  . Number of children: 0  . Years of education: 68  . Highest education level: Not on file  Occupational History  . 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 Smoking status: Former Smoker    Quit date: 1995    Years since quitting: 27.3  . Smokeless tobacco: Never Used  Substance and Sexual Activity  . Alcohol use: No  . Drug use: No  . Sexual activity: Yes    Partners: Male  Other Topics Concern  . Not on file  Social History Narrative   Lives with husband   Right Handed   Drinks 3-5 cups caffeine daily   Social Determinants of Health   Financial Resource Strain: Not on file  Food Insecurity: Not on file  Transportation Needs: Not on file  Physical Activity: Not on file  Stress: Not on file  Social Connections: Not on file  Intimate Partner Violence: Not on file    Outpatient Medications Prior to Visit  Medication Sig Dispense Refill  . acetaminophen (TYLENOL) 650 MG CR tablet Take 650 mg  by mouth 2 (two) times daily as needed for pain. For pain    . aspirin 81 MG tablet Take 81 mg by mouth daily.    . benazepril (LOTENSIN) 20 MG tablet TAKE 1 TABLET DAILY 90 tablet 0  . Blood Glucose Monitoring Suppl (ONE TOUCH ULTRA SYSTEM KIT) W/DEVICE KIT 1 kit by Does not apply route once. 1 each 0  . Continuous Blood Gluc Receiver (FREESTYLE LIBRE 14 DAY READER) DEVI 1 Device by Does not apply route See admin instructions. Use for continuous monitor glucose 1 each 0  . Continuous Blood Gluc Sensor (FREESTYLE LIBRE 14 DAY SENSOR) MISC Apply new sensor every 14 days for continuous glucose monitoring. 6 each 2  . estradiol (ESTRACE) 1 MG tablet Take 1 tablet (1 mg total) by mouth daily. 90 tablet 1  . ezetimibe-simvastatin (VYTORIN) 10-40 MG tablet TAKE 1 TABLET AT BEDTIME 90 tablet 0  . fenofibrate 160 MG tablet TAKE 1 TABLET DAILY 90 tablet 0  . glucose blood (ONETOUCH ULTRA) test strip Use to check blood sugar 3 times a day  100 each 0  . insulin aspart (NOVOLOG FLEXPEN) 100 UNIT/ML FlexPen Inject 18-24 Units into the skin 3 (three) times daily with meals. 45 mL 3  . Insulin Glargine (BASAGLAR KWIKPEN) 100 UNIT/ML Inject 60 Units into the skin at bedtime. 45 mL 3  . Insulin Pen Needle (PEN NEEDLES) 32G X 4 MM MISC USE AS INSTRUCTED WITH LANCETS. 400 each 3  . metFORMIN (GLUCOPHAGE) 850 MG tablet Take 1 tablet in the AM, and 2 tablets in the PM. 270 tablet 3  . Semaglutide, 1 MG/DOSE, (OZEMPIC, 1 MG/DOSE,) 4 MG/3ML SOPN INJECT 1MG  SUBCUTANEOUSLY  ONCE A WEEK. 9 mL 3  . FLUoxetine (PROZAC) 20 MG tablet Take 3 tablets (60 mg total) by mouth daily. 90 tablet 3  . pantoprazole (PROTONIX) 40 MG tablet Take 1 tablet (40 mg total) by mouth daily. Pt due for office visit 30 tablet 0   Facility-Administered Medications Prior to Visit  Medication Dose Route Frequency Provider Last Rate Last Admin  . ipratropium-albuterol (DUONEB) 0.5-2.5 (3) MG/3ML nebulizer solution 3 mL  3 mL Nebulization Q6H Copland, Jessica C, MD   3 mL at 11/15/16 1330    Allergies  Allergen Reactions  . Hydrocodone-Acetaminophen Other (See Comments)    unknown  . Oxycodone Hcl Other (See Comments)    unknown  . Penicillins Other (See Comments)    unknown  . Prednisone     Patient has noted allergy to prednisone but she says it was a questionable history of mild nausea with prednisone and nothing more     ROS     Objective:    Physical Exam Constitutional:      Appearance: Normal appearance.  HENT:     Head: Normocephalic and atraumatic.     Right Ear: External ear normal.     Left Ear: External ear normal.  Cardiovascular:     Rate and Rhythm: Normal rate and regular rhythm.     Pulses: Normal pulses.     Heart sounds: Normal heart sounds. No murmur heard. No gallop.   Pulmonary:     Effort: Pulmonary effort is normal. No respiratory distress.     Breath sounds: Normal breath sounds. No wheezing, rhonchi or rales.  Skin:     General: Skin is warm and dry.  Neurological:     Mental Status: She is alert and oriented to person, place, and time.  Psychiatric:  Behavior: Behavior normal.     BP (!) 144/80 (BP Location: Right Arm, Patient Position: Sitting, Cuff Size: Large)   Pulse 88   Temp 99.1 F (37.3 C) (Oral)   Resp 20   Ht $R'5\' 3"'qt$  (1.6 m)   Wt 274 lb 6.4 oz (124.5 kg)   SpO2 97%   BMI 48.61 kg/m  Wt Readings from Last 3 Encounters:  03/19/21 274 lb 6.4 oz (124.5 kg)  02/27/21 275 lb (124.7 kg)  02/26/21 276 lb 9.6 oz (125.5 kg)    Diabetic Foot Exam - Simple   No data filed    Lab Results  Component Value Date   WBC 7.5 03/04/2021   HGB 13.1 03/04/2021   HCT 42.2 03/04/2021   PLT 207 03/04/2021   GLUCOSE 135 (H) 03/04/2021   CHOL 98 01/01/2021   TRIG 147.0 01/01/2021   HDL 48.70 01/01/2021   LDLDIRECT 64.0 04/21/2017   LDLCALC 20 01/01/2021   ALT 12 01/01/2021   AST 13 01/01/2021   NA 140 03/04/2021   K 4.7 03/04/2021   CL 102 03/04/2021   CREATININE 1.06 (H) 03/04/2021   BUN 33 (H) 03/04/2021   CO2 30 03/04/2021   TSH 3.22 08/07/2015   INR 1.2 RATIO 04/19/2007   HGBA1C 5.9 (H) 02/26/2021   MICROALBUR 28.9 (H) 09/10/2019    Lab Results  Component Value Date   TSH 3.22 08/07/2015   Lab Results  Component Value Date   WBC 7.5 03/04/2021   HGB 13.1 03/04/2021   HCT 42.2 03/04/2021   MCV 87.2 03/04/2021   PLT 207 03/04/2021   Lab Results  Component Value Date   NA 140 03/04/2021   K 4.7 03/04/2021   CO2 30 03/04/2021   GLUCOSE 135 (H) 03/04/2021   BUN 33 (H) 03/04/2021   CREATININE 1.06 (H) 03/04/2021   BILITOT 0.4 01/01/2021   ALKPHOS 26 (L) 01/01/2021   AST 13 01/01/2021   ALT 12 01/01/2021   PROT 6.0 01/01/2021   ALBUMIN 3.7 01/01/2021   CALCIUM 9.0 03/04/2021   ANIONGAP 8 03/04/2021   GFR 68.71 01/01/2021   Lab Results  Component Value Date   CHOL 98 01/01/2021   Lab Results  Component Value Date   HDL 48.70 01/01/2021   Lab Results   Component Value Date   LDLCALC 20 01/01/2021   Lab Results  Component Value Date   TRIG 147.0 01/01/2021   Lab Results  Component Value Date   CHOLHDL 2 01/01/2021   Lab Results  Component Value Date   HGBA1C 5.9 (H) 02/26/2021       Assessment & Plan:   Problem List Items Addressed This Visit      Unprioritized   Depression, major, single episode, moderate ([place])    Pt is frustrated by her loss of vision-- it is improving  F/u opth/ neuro      Relevant Medications   FLUoxetine (PROZAC) 20 MG tablet   Essential hypertension (Chronic)    Well controlled, no changes to meds. Encouraged heart healthy diet such as the DASH diet and exercise as tolerated.       GERD   Relevant Medications   pantoprazole (PROTONIX) 40 MG tablet   Hyperlipidemia associated with type 2 diabetes mellitus ([place])    Tolerating statin, encouraged heart healthy diet, avoid trans fats, minimize simple carbs and saturated fats. Increase exercise as tolerated      Papilledema - Primary    F/u neuro/ opth  Relevant Orders   Basic metabolic panel   CBC with Differential/Platelet   Type 2 diabetes mellitus with hyperglycemia, with long-term current use of insulin ([facility]) (Chronic)    Per endo       Other Visit Diagnoses    Depression with anxiety       Relevant Medications   FLUoxetine (PROZAC) 20 MG tablet       Meds ordered this encounter  Medications  . pantoprazole (PROTONIX) 40 MG tablet    Sig: Take 1 tablet (40 mg total) by mouth daily. Pt due for office visit    Dispense:  90 tablet    Refill:  3  . FLUoxetine (PROZAC) 20 MG tablet    Sig: Take 3 tablets (60 mg total) by mouth daily.    Dispense:  270 tablet    Refill:  3    I, Dr. Roma Schanz, DO, personally preformed the services described in this documentation.  All medical record entries made by the scribe were at my direction and in my presence.  I have reviewed the chart and discharge instructions (if  applicable) and agree that the record reflects my personal performance and is accurate and complete. 03/19/2021   I,Shehryar Baig,acting as a scribe for Andrea Held, DO.,have documented all relevant documentation on the behalf of Andrea Held, DO,as directed by  Andrea Held, DO while in the presence of Andrea Held, DO.   Andrea Held, DO

## 2021-03-19 NOTE — Assessment & Plan Note (Signed)
Per endo °

## 2021-03-19 NOTE — Assessment & Plan Note (Signed)
Pt is frustrated by her loss of vision-- it is improving  F/u opth/ neuro

## 2021-03-19 NOTE — Patient Instructions (Signed)

## 2021-03-19 NOTE — Assessment & Plan Note (Signed)
Well controlled, no changes to meds. Encouraged heart healthy diet such as the DASH diet and exercise as tolerated.  °

## 2021-03-20 ENCOUNTER — Other Ambulatory Visit (HOSPITAL_BASED_OUTPATIENT_CLINIC_OR_DEPARTMENT_OTHER): Payer: Self-pay

## 2021-03-20 ENCOUNTER — Telehealth: Payer: Self-pay | Admitting: Neurology

## 2021-03-20 LAB — CULTURE, FUNGUS WITHOUT SMEAR

## 2021-03-20 MED ORDER — PFIZER-BIONT COVID-19 VAC-TRIS 30 MCG/0.3ML IM SUSP
INTRAMUSCULAR | 0 refills | Status: DC
  Filled 2021-03-20: qty 0.3, 1d supply, fill #0

## 2021-03-20 NOTE — Telephone Encounter (Signed)
I called the patient.  MRI of the brain was done in the hospital, it looks like it was repeated after discharge.  No acute changes seen.  The patient has severe papilledema, spinal tap did not show an elevated spinal fluid pressure.   MRI brain 03/19/21:  IMPRESSION:   MRI brain without contrast demonstrating: -Stable mild periventricular and subcortical foci of nonspecific gliosis. -No acute findings.

## 2021-03-23 ENCOUNTER — Encounter: Payer: Self-pay | Admitting: Family Medicine

## 2021-03-23 ENCOUNTER — Other Ambulatory Visit: Payer: Self-pay

## 2021-03-23 DIAGNOSIS — Z780 Asymptomatic menopausal state: Secondary | ICD-10-CM

## 2021-03-23 MED ORDER — ESTRADIOL 1 MG PO TABS: 1.0000 mg | ORAL_TABLET | Freq: Every day | ORAL | 1 refills | Status: DC

## 2021-03-23 NOTE — Telephone Encounter (Signed)
I called Dr. Cathey Endow.  He has evaluated the patient, no change in visual field testing has been noted since coming out of the hospital.  The opening pressure of the lumbar puncture was 8 cm, there is some question of whether this was actually true or not.  The patient will be sent to Dr. Theone Murdoch from neuro-ophthalmology.  I believe this is reasonable.  There may be some utility in the next several weeks if the disc edema is not improving to repeat the lumbar puncture to ensure that the original opening pressure measured is true.  Dr. Cathey Endow will contact me if he feels that the lumbar puncture should be done again.

## 2021-03-27 ENCOUNTER — Telehealth: Payer: Self-pay | Admitting: Internal Medicine

## 2021-03-27 NOTE — Telephone Encounter (Signed)
Amber from [facility] is needing paper work that was sent 03/23/2021 by fax all needed is providers signature Continuous Blood Gluc Receiver (FREESTYLE LIBRE 14 DAY READER) DEVI  Continuous Blood Gluc Sensor (FREESTYLE LIBRE 14 DAY SENSOR) MISC   Amber will be faxing it back   Fax number for [facility] is [phone]

## 2021-03-28 ENCOUNTER — Encounter: Payer: Self-pay | Admitting: Internal Medicine

## 2021-03-30 ENCOUNTER — Other Ambulatory Visit: Payer: Self-pay | Admitting: Family Medicine

## 2021-03-30 DIAGNOSIS — K219 Gastro-esophageal reflux disease without esophagitis: Secondary | ICD-10-CM

## 2021-03-31 NOTE — Telephone Encounter (Signed)
Patient calling to advise that she heard from [facility] today and that they have cancelled her order for [facility] Reader & Sensor.  RX needs to be faxed to [facility] at [phone].  Patient just put on her last Free Style  Pt call back number is [phone]

## 2021-03-31 NOTE — Telephone Encounter (Signed)
Called and left a detailed message for Amber to re fax paperwork to [phone] to be completed and faxed back.

## 2021-04-02 ENCOUNTER — Encounter: Payer: Self-pay | Admitting: Internal Medicine

## 2021-04-08 ENCOUNTER — Encounter: Payer: Self-pay | Admitting: Internal Medicine

## 2021-04-09 ENCOUNTER — Encounter: Payer: Self-pay | Admitting: Internal Medicine

## 2021-04-09 NOTE — Telephone Encounter (Signed)
Pt contacted regarding message.

## 2021-04-10 ENCOUNTER — Telehealth: Payer: Self-pay

## 2021-04-10 DIAGNOSIS — E11319 Type 2 diabetes mellitus with unspecified diabetic retinopathy without macular edema: Secondary | ICD-10-CM

## 2021-04-10 MED ORDER — FREESTYLE LIBRE 14 DAY SENSOR MISC: 2 refills | Status: DC

## 2021-04-10 NOTE — Telephone Encounter (Signed)
Pt has been out of the sensors for the last two days. [facility] states that they have not received the information.

## 2021-04-10 NOTE — Telephone Encounter (Signed)
Pt would like a call back as soon as possible

## 2021-04-13 NOTE — Telephone Encounter (Signed)
Pt called and we printed and faxed rx to [facility].

## 2021-04-13 NOTE — Telephone Encounter (Signed)
Pt contacted. See previous call note

## 2021-04-13 NOTE — Telephone Encounter (Signed)
Pt contacted the office and Rx was printed and faxed to DME supplier.

## 2021-04-13 NOTE — Telephone Encounter (Signed)
Patient called to advise that DME supplier had not received faxed RX for Free Style Libre 14 day sensor.  I refaxed to [phone] Attn: Deanna Artis.  Fax received confirmation received at 10:26 AM

## 2021-04-22 ENCOUNTER — Ambulatory Visit: Payer: Self-pay | Admitting: Neurology

## 2021-04-23 ENCOUNTER — Encounter: Payer: Self-pay | Admitting: Internal Medicine

## 2021-04-23 ENCOUNTER — Ambulatory Visit: Payer: Medicare Other | Admitting: Neurology

## 2021-04-23 ENCOUNTER — Other Ambulatory Visit: Payer: Self-pay

## 2021-04-23 ENCOUNTER — Ambulatory Visit (INDEPENDENT_AMBULATORY_CARE_PROVIDER_SITE_OTHER): Payer: Medicare Other | Admitting: Internal Medicine

## 2021-04-23 ENCOUNTER — Telehealth: Payer: Self-pay | Admitting: Neurology

## 2021-04-23 VITALS — BP 162/90 | HR 90 | Ht 63.0 in | Wt 275.0 lb

## 2021-04-23 DIAGNOSIS — Z794 Long term (current) use of insulin: Secondary | ICD-10-CM | POA: Diagnosis not present

## 2021-04-23 DIAGNOSIS — E11319 Type 2 diabetes mellitus with unspecified diabetic retinopathy without macular edema: Secondary | ICD-10-CM

## 2021-04-23 DIAGNOSIS — E785 Hyperlipidemia, unspecified: Secondary | ICD-10-CM

## 2021-04-23 NOTE — Telephone Encounter (Signed)
The patient was seen by Dr. Theone Murdoch on 14 April 2021.  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  Risk factors were age, optic disc configuration, and diabetes.

## 2021-04-23 NOTE — Progress Notes (Signed)
Patient ID: Andrea Marks, female   DOB: June 09, 1951, 70 y.o.   MRN: [id number]  This visit occurred during the SARS-CoV-2 public health emergency.  Safety protocols were in place, including screening questions prior to the visit, additional usage of staff PPE, and extensive cleaning of exam room while observing appropriate contact time as indicated for disinfecting solutions.   HPI: Andrea Marks is a 70 y.o.-year-old female, returning for f/u for DM2, dx 1994, insulin-dependent, uncontrolled, with complications (diabetic retinopathy). Last visit was 4 months ago.  Interim history: Since last visit, she was admitted with blurry vision, papilledema in 02/26/2021.  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 She saw a retina specialist and a neurologist. She was on steroids >> sugars higher.  Her headaches improved, but she still has blurry vision in the right eye.  She is definitely affected by this since she is the only driver in the family. She is otherwise feeling well, without increased urination, nausea, chest pain.  Reviewed HbA1c levels: Lab Results  Component Value Date   HGBA1C 5.9 (H) 02/26/2021   HGBA1C 6.0 (A) 12/18/2020   HGBA1C 5.9 (A) 08/14/2020   She is on: - Metformin 850 mg in am and  1700 mg at night - Ozempic 1 mg weekly - Basaglar 60 units at bedtime >> Basaglar 70 >> 60 units at bedtime - NovoLog 3x a day before meals 15 units before a smaller meal 20 units before a larger meal (22-25 units before a large meal) Tried Bydureon once a week >> she had problems injecting the medication.  She also tried Victoza. She was on Humalog in the past. She was on Amaryl in the past, which we stopped going to increase her insulin doses 05/2018. She was on a Vgo pump, which we stopped in 2019 as she needed higher doses of insulin.  She checks her sugars more than 4 times a day with her freestyle CGM:   Previously:  Lowest  blood sugars: 50s >> 70s. Highest blood sugars: 300s >> 300s (steroids).  Meals: - Breakfast: egg + bagel + yoghurt - Lunch: may skip, salad - Dinner: salad, hamburger/hot dog, tuna fish - Snacks: potato chips, pretzel No sodas, only drinks sparkling water.  No CKD, last BUN/creatinine was:  Lab Results  Component Value Date   BUN 16 03/19/2021   CREATININE 0.79 03/19/2021  On benazepril.  + HL; Last set of lipids: Lab Results  Component Value Date   CHOL 98 01/01/2021   HDL 48.70 01/01/2021   LDLCALC 20 01/01/2021   LDLDIRECT 64.0 04/21/2017   TRIG 147.0 01/01/2021   CHOLHDL 2 01/01/2021  On Vytorin and fenofibrate.  - Last eye exam: 12/09/2020: No DR reportedly, but she did have DR in the past. + macular edema OS. May need eye injections. She had cataract surgery.  - + numbness and tingling in her feet.  She sees podiatry-Dr. Prudence Davidson.  She had an ulcer in lower right leg (has lymphedema) >> healed. She also has a history of HTN, GERD, depression, h/o thrombophlebitis. Previously on B12 injections now on 1000 mcg p.o. B12 daily.  Her husband has lung cancer.  ROS: Constitutional: no weight gain/no weight loss, no fatigue, no subjective hyperthermia, no subjective hypothermia Eyes: + blurry vision, no xerophthalmia ENT: no sore throat, no nodules palpated in neck, no dysphagia, no odynophagia, no hoarseness Cardiovascular: no CP/no SOB/no palpitations/no leg swelling Respiratory: no cough/no SOB/no wheezing Gastrointestinal: no N/no V/no D/no C/no acid reflux Musculoskeletal: no  muscle aches/no joint aches Skin: no rashes, no hair loss Neurological: no tremors/no numbness/no tingling/no dizziness, + resolved headaches  I reviewed pt's medications, allergies, PMH, social hx, family hx, and changes were documented in the history of present illness. Otherwise, unchanged from my initial visit note.   Past Medical History:  Diagnosis Date   Depression    Diabetes  mellitus    GERD (gastroesophageal reflux disease)    Hyperlipidemia    Hypertension    Obesity    Past Surgical History:  Procedure Laterality Date   ABDOMINAL HYSTERECTOMY  1991   BSO   Social History   Socioeconomic History   Marital status: Married    Spouse name: Not on file   Number of children: 0   Years of education: 13   Highest education level: Not on file  Occupational History   Occupation: retired from [employer], [employer]: RETIRED  Tobacco Use   Smoking status: Former    Pack years: 0.00    Types: Cigarettes    Quit date: 1995    Years since quitting: 27.4   Smokeless tobacco: Never  Substance and Sexual Activity   Alcohol use: No   Drug use: No   Sexual activity: Yes    Partners: Male  Other Topics Concern   Not on file  Social History Narrative   Lives with husband   Right Handed   Drinks 3-5 cups caffeine daily   Social Determinants of Health   Financial Resource Strain: Not on file  Food Insecurity: Not on file  Transportation Needs: Not on file  Physical Activity: Not on file  Stress: Not on file  Social Connections: Not on file  Intimate Partner Violence: Not on file   Current Outpatient Medications on File Prior to Visit  Medication Sig Dispense Refill   acetaminophen (TYLENOL) 650 MG CR tablet Take 650 mg by mouth 2 (two) times daily as needed for pain. For pain     aspirin 81 MG tablet Take 81 mg by mouth daily.     benazepril (LOTENSIN) 20 MG tablet TAKE 1 TABLET DAILY 90 tablet 0   Blood Glucose Monitoring Suppl (ONE TOUCH ULTRA SYSTEM KIT) W/DEVICE KIT 1 kit by Does not apply route once. 1 each 0   Continuous Blood Gluc Receiver (FREESTYLE LIBRE 14 DAY READER) DEVI 1 Device by Does not apply route See admin instructions. Use for continuous monitor glucose 1 each 0   Continuous Blood Gluc Sensor (FREESTYLE LIBRE 14 DAY SENSOR) MISC Apply new sensor every 14 days for continuous glucose monitoring. 6 each 2   COVID-19 mRNA Vac-TriS,  Pfizer, (PFIZER-BIONT COVID-19 VAC-TRIS) SUSP injection Inject into the muscle. 0.3 mL 0   estradiol (ESTRACE) 1 MG tablet Take 1 tablet (1 mg total) by mouth daily. 90 tablet 1   ezetimibe-simvastatin (VYTORIN) 10-40 MG tablet TAKE 1 TABLET AT BEDTIME 90 tablet 0   fenofibrate 160 MG tablet TAKE 1 TABLET DAILY 90 tablet 0   FLUoxetine (PROZAC) 20 MG tablet Take 3 tablets (60 mg total) by mouth daily. 270 tablet 3   glucose blood (ONETOUCH ULTRA) test strip Use to check blood sugar 3 times a day 100 each 0   insulin aspart (NOVOLOG FLEXPEN) 100 UNIT/ML FlexPen Inject 18-24 Units into the skin 3 (three) times daily with meals. 45 mL 3   Insulin Glargine (BASAGLAR KWIKPEN) 100 UNIT/ML Inject 60 Units into the skin at bedtime. 45 mL 3   Insulin Pen Needle (PEN  NEEDLES) 32G X 4 MM MISC USE AS INSTRUCTED WITH LANCETS. 400 each 3   metFORMIN (GLUCOPHAGE) 850 MG tablet Take 1 tablet in the AM, and 2 tablets in the PM. 270 tablet 3   pantoprazole (PROTONIX) 40 MG tablet TAKE 1 TABLET DAILY 90 tablet 3   Semaglutide, 1 MG/DOSE, (OZEMPIC, 1 MG/DOSE,) 4 MG/3ML SOPN INJECT 1MG SUBCUTANEOUSLY  ONCE A WEEK. 9 mL 3   Current Facility-Administered Medications on File Prior to Visit  Medication Dose Route Frequency Provider Last Rate Last Admin   ipratropium-albuterol (DUONEB) 0.5-2.5 (3) MG/3ML nebulizer solution 3 mL  3 mL Nebulization Q6H Copland, Jessica C, MD   3 mL at 11/15/16 1330   Allergies  Allergen Reactions   Hydrocodone-Acetaminophen Other (See Comments)    unknown   Oxycodone Hcl Other (See Comments)    unknown   Penicillins Other (See Comments)    unknown   Prednisone     Patient has noted allergy to prednisone but she says it was a questionable history of mild nausea with prednisone and nothing more    Family History  Problem Relation Age of Onset   Macular degeneration Mother    Heart disease Mother        syncope   Aortic stenosis Mother    Lung disease Father 35        mesothelioma   Cancer Maternal Aunt        stomach   Heart disease Paternal Uncle        cabg   Diabetes Paternal Uncle    Heart disease Paternal Uncle    Diabetes Paternal Uncle    Diabetes Paternal Uncle    Heart disease Paternal Uncle    Heart disease Paternal Uncle    Heart disease Paternal Uncle    Lung cancer Other        asbestos   Diabetes Paternal Grandmother    Cancer Other        lung   PE: BP (!) 162/90   Pulse 90   Ht _0  (1.6 m)   Wt 275 lb (124.7 kg)   SpO2 95%   BMI 48.71 kg/m  Wt Readings from Last 3 Encounters:  04/23/21 275 lb (124.7 kg)  03/19/21 274 lb 6.4 oz (124.5 kg)  02/27/21 275 lb (124.7 kg)   Constitutional: overweight, in NAD Eyes: PERRLA, EOMI, no exophthalmos ENT: moist mucous membranes, no thyromegaly, no cervical lymphadenopathy Cardiovascular: RRR, No MRG, + B LE edema Respiratory: CTA B Gastrointestinal: abdomen soft, NT, ND, BS+ Musculoskeletal: no deformities, strength intact in all 4 Skin: moist, warm, no rashes except stasis dermatitis of bilateral lower legs Neurological: no tremor with outstretched hands, DTR normal in all 4  ASSESSMENT: 1. DM2, insulin-dependent, uncontrolled, with complications - DR  2. Obesity class 3  3. HL  PLAN:  1. Patient with history of uncontrolled type 2 diabetes, insulin-dependent, with worse control in 2020, when she returned after an absence of 1.5 years.  At that time, she had a lot of stress in her life and she also relaxed her diet and sugars were in the 200s to 400s.  We discussed about improving diet and also increased her insulin and GLP-1 receptor agonist dose.  Sugars started to improve on a basal-bolus insulin regimen, metformin, and the weekly GLP-1 receptor agonist.  At last visit, her sugars were dropping overnight and they were occasionally under 70.  During the day, before and sometimes after dinner, blood sugars were also occasionally  dropping with values in the 50s.  We reduced  the dose of Basaglar and NovoLog pen.  HbA1c at last visit was 6.0% but she had a more recent A1c less than 2 months ago and this was 5.9%. CGM interpretation: -At today's visit, we reviewed her CGM downloads: It appears that 81% of values are in target range (goal >70%), while 14% are higher than 180 (goal <25%), and 5% are lower than 70 (goal <4%).  The calculated average blood sugar is 127.   -Reviewing the CGM trends, it appears that her sugars are excellent overnight, they increase slightly after breakfast, but increase more after lunch and dinner.  However, reviewing individual tracings, it appears that sugars improved in the last 5 days, with less prominent postprandial excursions. - It appears that sugars improved after initially being quite high during her steroid treatments.  For now, I do not absolutely think that we need to intensify her regimen.  I advised her to let me know if the sugars increase, in which case, we may need to increase Ozempic to 2 mg weekly. - I advised her to:  Patient Instructions  Please continue: - Metformin 850 mg in am and 1700 mg at night - Ozempic 1 mg weekly - NovoLog 3x a day before meals 15 units before a smaller meal 20 units before a larger meal (may need 22-25 units before a large meal) - Basaglar 60 units at bedtime  Please return in 4 months.  - advised to check sugars at different times of the day - 4x a day, rotating check times - advised for yearly eye exams >> she is UTD - return to clinic in 4 months  2. Obesity class 3  -We will continue her GLP-1 receptor agonist, Ozempic 1 mg weekly, which should also help with weight loss -She gained 5 pounds before last visit and lost 3 pounds since then  3. HL -Reviewed latest lipid panel from 12/2020: Excellent: Lab Results  Component Value Date   CHOL 98 01/01/2021   HDL 48.70 01/01/2021   LDLCALC 20 01/01/2021   LDLDIRECT 64.0 04/21/2017   TRIG 147.0 01/01/2021   CHOLHDL 2 01/01/2021   -She continues on simvastatin-ezetimibe 40-10 mg daily and also fenofibrate 160 mg daily without side effects  Philemon Kingdom, MD PhD [facility] Endocrinology

## 2021-04-23 NOTE — Patient Instructions (Signed)
Please continue: - Metformin 850 mg in am and 1700 mg at night - Ozempic 1 mg weekly - NovoLog 3x a day before meals 15 units before a smaller meal 20 units before a larger meal (may need 22-25 units before a large meal) - Basaglar 60 units at bedtime  Please return in 4 months.

## 2021-05-05 ENCOUNTER — Ambulatory Visit (INDEPENDENT_AMBULATORY_CARE_PROVIDER_SITE_OTHER): Payer: Medicare Other | Admitting: Family Medicine

## 2021-05-05 ENCOUNTER — Encounter: Payer: Self-pay | Admitting: Family Medicine

## 2021-05-05 ENCOUNTER — Other Ambulatory Visit: Payer: Self-pay

## 2021-05-05 DIAGNOSIS — M170 Bilateral primary osteoarthritis of knee: Secondary | ICD-10-CM | POA: Diagnosis not present

## 2021-05-05 NOTE — Assessment & Plan Note (Addendum)
Bilateral injections given today and tolerated the procedure well.  The patient developed the elevated blood sugar secondary to oral anti-inflammatories.  We discussed with patient again to monitor her blood sugars.  Patient is on insulin and is able to Adjust accordingly.  Patient knows to seek medical attention for any type of worsening pain as well.  Patient could be a candidate as well for viscosupplementation again and we will try to get approval. Patient states that during any of this she has not had elevated pressures in her eyes.  Patient is following up with her ophthalmologist this week and knows to discuss with them as well.  Do not think that anything else will be necessary with them actually putting her on prednisone initially.

## 2021-05-05 NOTE — Patient Instructions (Addendum)
Good to see you We will get you approved for gel See me again in 2 months

## 2021-05-05 NOTE — Progress Notes (Signed)
[place] [address] [place] Phone: [phone] Subjective:   I Andrea Marks am serving as a Education administrator for Dr. Hulan Saas.  This visit occurred during the SARS-CoV-2 public health emergency.  Safety protocols were in place, including screening questions prior to the visit, additional usage of staff PPE, and extensive cleaning of exam room while observing appropriate contact time as indicated for disinfecting solutions.   I'm seeing this patient by the request  of:  [name], DO  CC: Bilateral knee pain  VPC:[id number]  Andrea Marks is a 70 y.o. female coming in with complaint of bilateral knee pain. Left knee is worse then right. Right knee started up yesterday.  Patient states that it is starting to affect daily activities.  Reviewed patient's chart and was found and patient was in the emergency room as well as in the hospital secondary to papilledema.  Cause could be potentially hypertension as well as diabetes.  Patient still has not regained any of the eye function.  Patient is also hospitalized secondary to the prednisone causing increase in elevated blood sugars.     Past Medical History:  Diagnosis Date   Depression    Diabetes mellitus    GERD (gastroesophageal reflux disease)    Hyperlipidemia    Hypertension    Obesity    Past Surgical History:  Procedure Laterality Date   ABDOMINAL HYSTERECTOMY  1991   BSO   Social History   Socioeconomic History   Marital status: Married    Spouse name: Not on file   Number of children: 0   Years of education: 13   Highest education level: Not on file  Occupational History   Occupation: retired from [employer], [employer]: RETIRED  Tobacco Use   Smoking status: Former    Pack years: 0.00    Types: Cigarettes    Quit date: 1995    Years since quitting: 27.5   Smokeless tobacco: Never  Substance and Sexual Activity   Alcohol use: No   Drug  use: No   Sexual activity: Yes    Partners: Male  Other Topics Concern   Not on file  Social History Narrative   Lives with husband   Right Handed   Drinks 3-5 cups caffeine daily   Social Determinants of Health   Financial Resource Strain: Not on file  Food Insecurity: Not on file  Transportation Needs: Not on file  Physical Activity: Not on file  Stress: Not on file  Social Connections: Not on file   Allergies  Allergen Reactions   Hydrocodone-Acetaminophen Other (See Comments)    unknown   Oxycodone Hcl Other (See Comments)    unknown   Penicillins Other (See Comments)    unknown   Prednisone     Patient has noted allergy to prednisone but she says it was a questionable history of mild nausea with prednisone and nothing more    Family History  Problem Relation Age of Onset   Macular degeneration Mother    Heart disease Mother        syncope   Aortic stenosis Mother    Lung disease Father 71       mesothelioma   Cancer Maternal Aunt        stomach   Heart disease Paternal Uncle        cabg   Diabetes Paternal Uncle    Heart disease Paternal Uncle  Diabetes Paternal Uncle    Diabetes Paternal Uncle    Heart disease Paternal Uncle    Heart disease Paternal Uncle    Heart disease Paternal Uncle    Lung cancer Other        asbestos   Diabetes Paternal Grandmother    Cancer Other        lung    Current Outpatient Medications (Endocrine & Metabolic):    estradiol (ESTRACE) 1 MG tablet, Take 1 tablet (1 mg total) by mouth daily.   insulin aspart (NOVOLOG FLEXPEN) 100 UNIT/ML FlexPen, Inject 18-24 Units into the skin 3 (three) times daily with meals.   Insulin Glargine (BASAGLAR KWIKPEN) 100 UNIT/ML, Inject 60 Units into the skin at bedtime.   metFORMIN (GLUCOPHAGE) 850 MG tablet, Take 1 tablet in the AM, and 2 tablets in the PM.   Semaglutide, 1 MG/DOSE, (OZEMPIC, 1 MG/DOSE,) 4 MG/3ML SOPN, INJECT $RemoveBefo'1MG'ViDvjqjcvBW$  SUBCUTANEOUSLY  ONCE A WEEK.   Current Outpatient  Medications (Cardiovascular):    benazepril (LOTENSIN) 20 MG tablet, TAKE 1 TABLET DAILY   ezetimibe-simvastatin (VYTORIN) 10-40 MG tablet, TAKE 1 TABLET AT BEDTIME   fenofibrate 160 MG tablet, TAKE 1 TABLET DAILY    Current Facility-Administered Medications (Respiratory):    ipratropium-albuterol (DUONEB) 0.5-2.5 (3) MG/3ML nebulizer solution 3 mL  Current Outpatient Medications (Analgesics):    acetaminophen (TYLENOL) 650 MG CR tablet, Take 650 mg by mouth 2 (two) times daily as needed for pain. For pain   aspirin 81 MG tablet, Take 81 mg by mouth daily.     Current Outpatient Medications (Other):    Blood Glucose Monitoring Suppl (ONE TOUCH ULTRA SYSTEM KIT) W/DEVICE KIT, 1 kit by Does not apply route once.   Continuous Blood Gluc Receiver (FREESTYLE LIBRE 14 DAY READER) DEVI, 1 Device by Does not apply route See admin instructions. Use for continuous monitor glucose   Continuous Blood Gluc Sensor (FREESTYLE LIBRE 14 DAY SENSOR) MISC, Apply new sensor every 14 days for continuous glucose monitoring.   COVID-19 mRNA Vac-TriS, Pfizer, (PFIZER-BIONT COVID-19 VAC-TRIS) SUSP injection, Inject into the muscle.   FLUoxetine (PROZAC) 20 MG tablet, Take 3 tablets (60 mg total) by mouth daily.   glucose blood (ONETOUCH ULTRA) test strip, Use to check blood sugar 3 times a day   Insulin Pen Needle (PEN NEEDLES) 32G X 4 MM MISC, USE AS INSTRUCTED WITH LANCETS.   pantoprazole (PROTONIX) 40 MG tablet, TAKE 1 TABLET DAILY    Reviewed prior external information including notes and imaging from  primary care provider As well as notes that were available from care everywhere and other healthcare systems.  Past medical history, social, surgical and family history all reviewed in electronic medical record.  No pertanent information unless stated regarding to the chief complaint.   Review of Systems:  No headache, visual changes, nausea, vomiting, diarrhea, constipation, dizziness, abdominal pain,  skin rash, fevers, chills, night sweats, weight loss, swollen lymph nodes, , chest pain, shortness of breath, mood changes. POSITIVE muscle aches, joint swelling and body aches as well as visual changes that were discussed above.  Patient had headaches for a while but those have resolved.  Objective  Blood pressure (!) 150/82, pulse 83, height $RemoveBe'5\' 3"'zLmfcKiMb$  (1.6 m), weight 278 lb (126.1 kg), SpO2 96 %.   General: No apparent distress alert and oriented x3 mood and affect normal, dressed appropriately.  Morbidly obese HEENT: Pupils equal, extraocular movements intact  Respiratory: Patient's speak in full sentences and does not appear short of breath  Cardiovascular: 1+ lower extremity edema, mild tender, no erythema  Gait$RemoveBefor' \\severely'SOuAgxsuKXag$  antalgic favoring the right knee. Knees bilaterally do have significant arthritic changes noted.  Instability with valgus and varus force.  Limited range of motion in all planes.                                                                                           After informed written and verbal consent, patient was seated on exam table. Right knee was prepped with alcohol swab and utilizing anterolateral approach, patient's right knee space was injected with 4:1  marcaine 0.5%: Kenalog $RemoveBefor'40mg'KPeaxmdcMyHt$ /dL. Patient tolerated the procedure well without immediate complications.  After informed written and verbal consent, patient was seated on exam table. Left knee was prepped with alcohol swab and utilizing anterolateral approach, patient's left knee space was injected with 4:1  marcaine 0.5%: Kenalog $RemoveBefor'40mg'mfmIHTTmVCnT$ /dL. Patient tolerated the procedure well without immediate complications.     Impression and Recommendations:     The above documentation has been reviewed and is accurate and complete Lyndal Pulley, DO

## 2021-05-15 ENCOUNTER — Encounter: Payer: Self-pay | Admitting: Podiatry

## 2021-05-15 ENCOUNTER — Ambulatory Visit (INDEPENDENT_AMBULATORY_CARE_PROVIDER_SITE_OTHER): Payer: Medicare Other | Admitting: Podiatry

## 2021-05-15 ENCOUNTER — Other Ambulatory Visit: Payer: Self-pay

## 2021-05-15 DIAGNOSIS — Z794 Long term (current) use of insulin: Secondary | ICD-10-CM

## 2021-05-15 DIAGNOSIS — M79676 Pain in unspecified toe(s): Secondary | ICD-10-CM | POA: Diagnosis not present

## 2021-05-15 DIAGNOSIS — B351 Tinea unguium: Secondary | ICD-10-CM

## 2021-05-15 DIAGNOSIS — E1151 Type 2 diabetes mellitus with diabetic peripheral angiopathy without gangrene: Secondary | ICD-10-CM | POA: Diagnosis not present

## 2021-05-15 NOTE — Progress Notes (Signed)
This patient returns to my office for at risk foot care.  This patient requires this care by a professional since this patient will be at risk due to having diabetes and history of thrombophlebitis.  This patient is unable to cut nails herself since the patient cannot reach her nails.These nails are painful walking and wearing shoes.  This patient presents for at risk foot care today.  General Appearance  Alert, conversant and in no acute stress.  Vascular  Dorsalis pedis and posterior tibial  pulses are not  palpable  Bilaterally  Due to swelling..  Capillary return is within normal limits  bilaterally. Temperature is within normal limits  bilaterally.  Neurologic  Senn-Weinstein monofilament wire test diminished  bilaterally. Muscle power within normal limits bilaterally.  Nails Thick disfigured discolored nails with subungual debris  from hallux to fifth toes bilaterally. No evidence of bacterial infection or drainage bilaterally.  Orthopedic  No limitations of motion  feet .  No crepitus or effusions noted.  No bony pathology or digital deformities noted.  Skin  normotropic skin with no porokeratosis noted bilaterally.  No signs of infections or ulcers noted.   Pinch callus  B/L.   No infection or drainage noted.  Onychomycosis  Pain in right toes  Pain in left toes  Skin trauma 3rd toenail left foot.  Consent was obtained for treatment procedures.   Mechanical debridement of nails 1-5  bilaterally performed with a nail nipper.  Filed with dremel without incident.    Return office visit   10 weeks                  Told patient to return for periodic foot care and evaluation due to potential at risk complications.   Helane Gunther DPM

## 2021-05-26 NOTE — Progress Notes (Signed)
Subjective:   Andrea Marks is a 70 y.o. female who presents for Medicare Annual (Subsequent) preventive examination.  I connected with Andrea Marks today by telephone and verified that I am speaking with the correct person using two identifiers. Location patient: home Location provider: work Persons participating in the virtual visit: patient, Marine scientist.    I discussed the limitations, risks, security and privacy concerns of performing an evaluation and management service by telephone and the availability of in person appointments. I also discussed with the patient that there may be a patient responsible charge related to this service. The patient expressed understanding and verbally consented to this telephonic visit.    Interactive audio and video telecommunications were attempted between this provider and patient, however failed, due to patient having technical difficulties OR patient did not have access to video capability.  We continued and completed visit with audio only.  Some vital signs may be absent or patient reported.   Time Spent with patient on telephone encounter: 20 minutes   Review of Systems     Cardiac Risk Factors include: advanced age (>62men, >64 women);diabetes mellitus;dyslipidemia;hypertension;obesity (BMI >30kg/m2);sedentary lifestyle     Objective:    Today's Vitals   05/27/21 0900  Weight: 278 lb (126.1 kg)  Height: $Remove'5\' 3"'JRvfOPS$  (1.6 m)   Body mass index is 49.25 kg/m.  Advanced Directives 05/27/2021 02/27/2021 05/16/2020 06/29/2018 01/24/2017 11/15/2016  Does Patient Have a Medical Advance Directive? No No No No No No  Would patient like information on creating a medical advance directive? Yes (MAU/Ambulatory/Procedural Areas - Information given) No - Patient declined Yes (MAU/Ambulatory/Procedural Areas - Information given) No - Patient declined Yes (MAU/Ambulatory/Procedural Areas - Information given) -    Current Medications (verified) Outpatient  Encounter Medications as of 05/27/2021  Medication Sig   acetaminophen (TYLENOL) 650 MG CR tablet Take 650 mg by mouth 2 (two) times daily as needed for pain. For pain   aspirin 81 MG tablet Take 81 mg by mouth daily.   benazepril (LOTENSIN) 20 MG tablet TAKE 1 TABLET DAILY   Blood Glucose Monitoring Suppl (ONE TOUCH ULTRA SYSTEM KIT) W/DEVICE KIT 1 kit by Does not apply route once.   Continuous Blood Gluc Receiver (FREESTYLE LIBRE 14 DAY READER) DEVI 1 Device by Does not apply route See admin instructions. Use for continuous monitor glucose   Continuous Blood Gluc Sensor (FREESTYLE LIBRE 14 DAY SENSOR) MISC Apply new sensor every 14 days for continuous glucose monitoring.   estradiol (ESTRACE) 1 MG tablet Take 1 tablet (1 mg total) by mouth daily.   ezetimibe-simvastatin (VYTORIN) 10-40 MG tablet TAKE 1 TABLET AT BEDTIME   fenofibrate 160 MG tablet TAKE 1 TABLET DAILY   FLUoxetine (PROZAC) 20 MG tablet Take 3 tablets (60 mg total) by mouth daily.   glucose blood (ONETOUCH ULTRA) test strip Use to check blood sugar 3 times a day   insulin aspart (NOVOLOG FLEXPEN) 100 UNIT/ML FlexPen Inject 18-24 Units into the skin 3 (three) times daily with meals.   Insulin Glargine (BASAGLAR KWIKPEN) 100 UNIT/ML Inject 60 Units into the skin at bedtime.   Insulin Pen Needle (PEN NEEDLES) 32G X 4 MM MISC USE AS INSTRUCTED WITH LANCETS.   metFORMIN (GLUCOPHAGE) 850 MG tablet Take 1 tablet in the AM, and 2 tablets in the PM.   pantoprazole (PROTONIX) 40 MG tablet TAKE 1 TABLET DAILY   Semaglutide, 1 MG/DOSE, (OZEMPIC, 1 MG/DOSE,) 4 MG/3ML SOPN INJECT $RemoveBef'1MG'dlllcPytmH$  SUBCUTANEOUSLY  ONCE A WEEK.   [DISCONTINUED] CLEXN-17  mRNA Vac-TriS, Pfizer, (PFIZER-BIONT COVID-19 VAC-TRIS) SUSP injection Inject into the muscle.   Facility-Administered Encounter Medications as of 05/27/2021  Medication   ipratropium-albuterol (DUONEB) 0.5-2.5 (3) MG/3ML nebulizer solution 3 mL    Allergies (verified) Hydrocodone-acetaminophen, Oxycodone  hcl, Penicillins, and Prednisone   History: Past Medical History:  Diagnosis Date   Depression    Diabetes mellitus    GERD (gastroesophageal reflux disease)    Hyperlipidemia    Hypertension    Obesity    Past Surgical History:  Procedure Laterality Date   ABDOMINAL HYSTERECTOMY  1991   BSO   Family History  Problem Relation Age of Onset   Macular degeneration Mother    Heart disease Mother        syncope   Aortic stenosis Mother    Lung disease Father 7       mesothelioma   Cancer Maternal Aunt        stomach   Heart disease Paternal Uncle        cabg   Diabetes Paternal Uncle    Heart disease Paternal Uncle    Diabetes Paternal Uncle    Diabetes Paternal Uncle    Heart disease Paternal Uncle    Heart disease Paternal Uncle    Heart disease Paternal Uncle    Lung cancer Other        asbestos   Diabetes Paternal Grandmother    Cancer Other        lung   Social History   Socioeconomic History   Marital status: Married    Spouse name: Not on file   Number of children: 0   Years of education: 13   Highest education level: Not on file  Occupational History   Occupation: retired from Surveyor, minerals: RETIRED  Tobacco Use   Smoking status: Former    Types: Cigarettes    Quit date: 1995    Years since quitting: 27.5   Smokeless tobacco: Never  Substance and Sexual Activity   Alcohol use: No   Drug use: No   Sexual activity: Yes    Partners: Male  Other Topics Concern   Not on file  Social History Narrative   Lives with husband   Right Handed   Drinks 3-5 cups caffeine daily   Social Determinants of Health   Financial Resource Strain: Low Risk    Difficulty of Paying Living Expenses: Not hard at all  Food Insecurity: No Food Insecurity   Worried About Charity fundraiser in the Last Year: Never true   Arboriculturist in the Last Year: Never true  Transportation Needs: No Transportation Needs   Lack of Transportation (Medical): No    Lack of Transportation (Non-Medical): No  Physical Activity: Inactive   Days of Exercise per Week: 0 days   Minutes of Exercise per Session: 0 min  Stress: No Stress Concern Present   Feeling of Stress : Not at all  Social Connections: Moderately Isolated   Frequency of Communication with Friends and Family: More than three times a week   Frequency of Social Gatherings with Friends and Family: Once a week   Attends Religious Services: Never   Marine scientist or Organizations: No   Attends Music therapist: Never   Marital Status: Married    Tobacco Counseling Counseling given: Not Answered   Clinical Intake:  Pre-visit preparation completed: Yes  Pain : No/denies pain     Nutritional Status: BMI >  30  Obese Nutritional Risks: None Diabetes: Yes CBG done?: No Did pt. bring in CBG monitor from home?: No (phone visit)  How often do you need to have someone help you when you read instructions, pamphlets, or other written materials from your doctor or pharmacy?: 1 - Never  Diabetes:  Is the patient diabetic?  Yes  If diabetic, was a CBG obtained today?  No  Did the patient bring in their glucometer from home?  No phone visit How often do you monitor your CBG's? Continuous glucose monitor.   Financial Strains and Diabetes Management:  Are you having any financial strains with the device, your supplies or your medication? No .  Does the patient want to be seen by Chronic Care Management for management of their diabetes?  No  Would the patient like to be referred to a Nutritionist or for Diabetic Management?  No   Diabetic Exams:  Diabetic Eye Exam: Completed 12/09/2020.   Diabetic Foot Exam: Completed 12/05/2020.   Interpreter Needed?: No  Information entered by :: Caroleen Hamman LPN   Activities of Daily Living In your present state of health, do you have any difficulty performing the following activities: 05/27/2021 02/27/2021  Hearing? N -   Vision? Y -  Comment Seeing Dr. Valetta Close for vision changes -  Difficulty concentrating or making decisions? N -  Walking or climbing stairs? N -  Dressing or bathing? N -  Doing errands, shopping? N N  Preparing Food and eating ? N -  Using the Toilet? N -  In the past six months, have you accidently leaked urine? N -  Do you have problems with loss of bowel control? N -  Managing your Medications? N -  Managing your Finances? N -  Housekeeping or managing your Housekeeping? N -  Some recent data might be hidden    Patient Care Team: Carollee Herter, Alferd Apa, DO as PCP - General Gardiner Barefoot, DPM as Consulting Physician (Podiatry) Jola Schmidt, MD as Consulting Physician (Ophthalmology) Philemon Kingdom, MD as Consulting Physician (Internal Medicine) Lyndal Pulley, DO as Consulting Physician (Family Medicine) Sherlynn Stalls, MD as Consulting Physician (Ophthalmology)  Indicate any recent Medical Services you may have received from other than [facility] providers in the past year (date may be approximate).     Assessment:   This is a routine wellness examination for Mariah.  Hearing/Vision screen Hearing Screening - Comments:: No issues Vision Screening - Comments:: Last eye exam-12/2020-Dr. Bowen  Dietary issues and exercise activities discussed: Current Exercise Habits: The patient does not participate in regular exercise at present, Exercise limited by: None identified   Goals Addressed             This Visit's Progress    Increase physical activity   Not on track      Depression Screen PHQ 2/9 Scores 05/27/2021 03/19/2021 05/16/2020 12/19/2018 01/24/2017 06/11/2016  PHQ - 2 Score $Remov'1 2 1 2 'GpmmlV$ 0 5  PHQ- 9 Score - - - 5 - 17    Fall Risk Fall Risk  05/27/2021 05/16/2020 05/23/2018 01/24/2017 12/07/2016  Falls in the past year? 0 0 Yes Yes Yes  Number falls in past yr: 0 0 $R'1 1 1  'gs$ Comment - - - - 02/012/2018  Injury with Fall? 0 0 Yes Yes Yes  Follow up Falls prevention  discussed Education provided;Falls prevention discussed - - -    FALL RISK PREVENTION PERTAINING TO THE HOME:  Any stairs in or around the home?  Yes  If so, are there any without handrails? No  Home free of loose throw rugs in walkways, pet beds, electrical cords, etc? Yes  Adequate lighting in your home to reduce risk of falls? Yes   ASSISTIVE DEVICES UTILIZED TO PREVENT FALLS:  Life alert? No  Use of a cane, walker or w/c? Yes  Grab bars in the bathroom? No  Shower chair or bench in shower? No  Elevated toilet seat or a handicapped toilet? No   TIMED UP AND GO:  Was the test performed? No . Phone visit   Cognitive Function:Normal cognitive status assessed by  this Nurse Health Advisor. No abnormalities found.          Immunizations Immunization History  Administered Date(s) Administered   PFIZER Comirnaty(Gray Top)Covid-19 Tri-Sucrose Vaccine 03/19/2021   PFIZER(Purple Top)SARS-COV-2 Vaccination 01/06/2020, 02/06/2020, 09/22/2020   Pneumococcal Conjugate-13 08/27/2016   Pneumococcal Polysaccharide-23 05/08/2003, 11/10/2009, 12/15/2018   Td 05/08/2003   Tdap 06/08/2011   Zoster Recombinat (Shingrix) 03/12/2020, 05/16/2020, 05/16/2020   Zoster, Live 02/10/2012    TDAP status: Up to date  Flu vaccine status: Due 07/2021  Pneumococcal vaccine status: Up to date  Covid-19 vaccine status: Completed vaccines  Qualifies for Shingles Vaccine? No   Zostavax completed Yes   Shingrix Completed?: Yes  Screening Tests Health Maintenance  Topic Date Due   COLONOSCOPY (Pts 45-66yrs Insurance coverage will need to be confirmed)  02/16/2021   MAMMOGRAM  12/20/2023 (Originally 02/24/2012)   DEXA SCAN  12/20/2023 (Originally 02/19/2016)   Hepatitis C Screening  12/20/2023 (Originally 02/18/1969)   INFLUENZA VACCINE  08/21/2024 (Originally 06/01/2021)   TETANUS/TDAP  06/07/2021   HEMOGLOBIN A1C  08/28/2021   FOOT EXAM  12/05/2021   OPHTHALMOLOGY EXAM  12/09/2021   COVID-19  Vaccine  Completed   PNA vac Low Risk Adult  Completed   Zoster Vaccines- Shingrix  Completed   HPV VACCINES  Aged Out    Health Maintenance  Health Maintenance Due  Topic Date Due   COLONOSCOPY (Pts 45-53yrs Insurance coverage will need to be confirmed)  02/16/2021    Colorectal cancer screening: Due-Declined  Mammogram status: Due -Declined  Bone Density status: Due-Declined  Lung Cancer Screening: (Low Dose CT Chest recommended if Age 35-80 years, 30 pack-year currently smoking OR have quit w/in 15years.) does not qualify.     Additional Screening:  Hepatitis C Screening: does qualify; Patient declined  Vision Screening: Recommended annual ophthalmology exams for early detection of glaucoma and other disorders of the eye. Is the patient up to date with their annual eye exam?  Yes  Who is the provider or what is the name of the office in which the patient attends annual eye exams? Dr. Valetta Close   Dental Screening: Recommended annual dental exams for proper oral hygiene  Community Resource Referral / Chronic Care Management: CRR required this visit?  No   CCM required this visit?  No      Plan:     I have personally reviewed and noted the following in the patient's chart:   Medical and social history Use of alcohol, tobacco or illicit drugs  Current medications and supplements including opioid prescriptions.  Functional ability and status Nutritional status Physical activity Advanced directives List of other physicians Hospitalizations, surgeries, and ER visits in previous 12 months Vitals Screenings to include cognitive, depression, and falls Referrals and appointments  In addition, I have reviewed and discussed with patient certain preventive protocols, quality metrics, and best practice recommendations. A written personalized  care plan for preventive services as well as general preventive health recommendations were provided to patient.   Due to this being  a telephonic visit, the after visit summary with patients personalized plan was offered to patient via mail or my-chart.  Patient would like to access on my-chart.    Marta Antu, LPN   [id number]  Nurse Health Advisor  Nurse Notes: None

## 2021-05-27 ENCOUNTER — Ambulatory Visit (INDEPENDENT_AMBULATORY_CARE_PROVIDER_SITE_OTHER): Payer: Medicare Other

## 2021-05-27 VITALS — Ht 63.0 in | Wt 278.0 lb

## 2021-05-27 DIAGNOSIS — Z0000 Encounter for general adult medical examination without abnormal findings: Secondary | ICD-10-CM

## 2021-05-27 NOTE — Patient Instructions (Signed)
Andrea Marks , Thank you for taking time to complete your Medicare Wellness Visit. I appreciate your ongoing commitment to your health goals. Please review the following plan we discussed and let me know if I can assist you in the future.   Screening recommendations/referrals: Colonoscopy: Due-Declined. Please call the office to schedule if you change your mind. Mammogram: Due-Declined. Please call the office to schedule if you change your mind. Bone Density: Due-Declined. Please call the office to schedule if you change your mind. Recommended yearly ophthalmology/optometry visit for glaucoma screening and checkup Recommended yearly dental visit for hygiene and checkup  Vaccinations: Influenza vaccine: Declined Pneumococcal vaccine: Up to date Tdap vaccine: Up to date-Due 06/07/2021 Shingles vaccine: Completed vaccines   Covid-19:Up to date  Advanced directives: Information mailed today  Conditions/risks identified: See problem list  Next appointment: Follow up in one year for your annual wellness visit    Preventive Care 70 Years and Older, Female Preventive care refers to lifestyle choices and visits with your health care provider that can promote health and wellness. What does preventive care include? A yearly physical exam. This is also called an annual well check. Dental exams once or twice a year. Routine eye exams. Ask your health care provider how often you should have your eyes checked. Personal lifestyle choices, including: Daily care of your teeth and gums. Regular physical activity. Eating a healthy diet. Avoiding tobacco and drug use. Limiting alcohol use. Practicing safe sex. Taking low-dose aspirin every day. Taking vitamin and mineral supplements as recommended by your health care provider. What happens during an annual well check? The services and screenings done by your health care provider during your annual well check will depend on your age, overall  health, lifestyle risk factors, and family history of disease. Counseling  Your health care provider may ask you questions about your: Alcohol use. Tobacco use. Drug use. Emotional well-being. Home and relationship well-being. Sexual activity. Eating habits. History of falls. Memory and ability to understand (cognition). Work and work Astronomer. Reproductive health. Screening  You may have the following tests or measurements: Height, weight, and BMI. Blood pressure. Lipid and cholesterol levels. These may be checked every 5 years, or more frequently if you are over 70 years old. Skin check. Lung cancer screening. You may have this screening every year starting at age 70 if you have a 30-pack-year history of smoking and currently smoke or have quit within the past 15 years. Fecal occult blood test (FOBT) of the stool. You may have this test every year starting at age 70. Flexible sigmoidoscopy or colonoscopy. You may have a sigmoidoscopy every 5 years or a colonoscopy every 10 years starting at age 70. Hepatitis C blood test. Hepatitis B blood test. Sexually transmitted disease (STD) testing. Diabetes screening. This is done by checking your blood sugar (glucose) after you have not eaten for a while (fasting). You may have this done every 1-3 years. Bone density scan. This is done to screen for osteoporosis. You may have this done starting at age 70. Mammogram. This may be done every 1-2 years. Talk to your health care provider about how often you should have regular mammograms. Talk with your health care provider about your test results, treatment options, and if necessary, the need for more tests. Vaccines  Your health care provider may recommend certain vaccines, such as: Influenza vaccine. This is recommended every year. Tetanus, diphtheria, and acellular pertussis (Tdap, Td) vaccine. You may need a Td booster every 10 years.  Zoster vaccine. You may need this after age  70. Pneumococcal 13-valent conjugate (PCV13) vaccine. One dose is recommended after age 70. Pneumococcal polysaccharide (PPSV23) vaccine. One dose is recommended after age 70. Talk to your health care provider about which screenings and vaccines you need and how often you need them. This information is not intended to replace advice given to you by your health care provider. Make sure you discuss any questions you have with your health care provider. Document Released: 11/14/2015 Document Revised: 07/07/2016 Document Reviewed: 08/19/2015 Elsevier Interactive Patient Education  2017 [place] Prevention in the Home Falls can cause injuries. They can happen to people of all ages. There are many things you can do to make your home safe and to help prevent falls. What can I do on the outside of my home? Regularly fix the edges of walkways and driveways and fix any cracks. Remove anything that might make you trip as you walk through a door, such as a raised step or threshold. Trim any bushes or trees on the path to your home. Use bright outdoor lighting. Clear any walking paths of anything that might make someone trip, such as rocks or tools. Regularly check to see if handrails are loose or broken. Make sure that both sides of any steps have handrails. Any raised decks and porches should have guardrails on the edges. Have any leaves, snow, or ice cleared regularly. Use sand or salt on walking paths during winter. Clean up any spills in your garage right away. This includes oil or grease spills. What can I do in the bathroom? Use night lights. Install grab bars by the toilet and in the tub and shower. Do not use towel bars as grab bars. Use non-skid mats or decals in the tub or shower. If you need to sit down in the shower, use a plastic, non-slip stool. Keep the floor dry. Clean up any water that spills on the floor as soon as it happens. Remove soap buildup in the tub or shower  regularly. Attach bath mats securely with double-sided non-slip rug tape. Do not have throw rugs and other things on the floor that can make you trip. What can I do in the bedroom? Use night lights. Make sure that you have a light by your bed that is easy to reach. Do not use any sheets or blankets that are too big for your bed. They should not hang down onto the floor. Have a firm chair that has side arms. You can use this for support while you get dressed. Do not have throw rugs and other things on the floor that can make you trip. What can I do in the kitchen? Clean up any spills right away. Avoid walking on wet floors. Keep items that you use a lot in easy-to-reach places. If you need to reach something above you, use a strong step stool that has a grab bar. Keep electrical cords out of the way. Do not use floor polish or wax that makes floors slippery. If you must use wax, use non-skid floor wax. Do not have throw rugs and other things on the floor that can make you trip. What can I do with my stairs? Do not leave any items on the stairs. Make sure that there are handrails on both sides of the stairs and use them. Fix handrails that are broken or loose. Make sure that handrails are as long as the stairways. Check any carpeting to make sure that  it is firmly attached to the stairs. Fix any carpet that is loose or worn. Avoid having throw rugs at the top or bottom of the stairs. If you do have throw rugs, attach them to the floor with carpet tape. Make sure that you have a light switch at the top of the stairs and the bottom of the stairs. If you do not have them, ask someone to add them for you. What else can I do to help prevent falls? Wear shoes that: Do not have high heels. Have rubber bottoms. Are comfortable and fit you well. Are closed at the toe. Do not wear sandals. If you use a stepladder: Make sure that it is fully opened. Do not climb a closed stepladder. Make sure that  both sides of the stepladder are locked into place. Ask someone to hold it for you, if possible. Clearly mark and make sure that you can see: Any grab bars or handrails. First and last steps. Where the edge of each step is. Use tools that help you move around (mobility aids) if they are needed. These include: Canes. Walkers. Scooters. Crutches. Turn on the lights when you go into a dark area. Replace any light bulbs as soon as they burn out. Set up your furniture so you have a clear path. Avoid moving your furniture around. If any of your floors are uneven, fix them. If there are any pets around you, be aware of where they are. Review your medicines with your doctor. Some medicines can make you feel dizzy. This can increase your chance of falling. Ask your doctor what other things that you can do to help prevent falls. This information is not intended to replace advice given to you by your health care provider. Make sure you discuss any questions you have with your health care provider. Document Released: 08/14/2009 Document Revised: 03/25/2016 Document Reviewed: 11/22/2014 Elsevier Interactive Patient Education  2017 [employer].

## 2021-06-03 ENCOUNTER — Encounter: Payer: Self-pay | Admitting: Family Medicine

## 2021-06-04 ENCOUNTER — Other Ambulatory Visit: Payer: Self-pay

## 2021-06-04 ENCOUNTER — Other Ambulatory Visit: Payer: Self-pay | Admitting: Family Medicine

## 2021-06-04 DIAGNOSIS — G8929 Other chronic pain: Secondary | ICD-10-CM

## 2021-06-04 MED ORDER — PREDNISONE 20 MG PO TABS: 20.0000 mg | ORAL_TABLET | Freq: Every day | ORAL | 0 refills | Status: DC

## 2021-06-04 NOTE — Progress Notes (Signed)
Rx called in 

## 2021-06-04 NOTE — Telephone Encounter (Signed)
Patient called following up on this message. Please advise.

## 2021-06-07 ENCOUNTER — Encounter: Payer: Self-pay | Admitting: Family Medicine

## 2021-06-09 ENCOUNTER — Other Ambulatory Visit: Payer: Self-pay | Admitting: Family Medicine

## 2021-06-09 DIAGNOSIS — E785 Hyperlipidemia, unspecified: Secondary | ICD-10-CM

## 2021-06-17 ENCOUNTER — Encounter: Payer: Self-pay | Admitting: Family Medicine

## 2021-06-17 NOTE — Progress Notes (Signed)
[place] Andrea Marks Phone: [phone] Subjective:   [name], am serving as a scribe for Dr. Hulan Saas. This visit occurred during the SARS-CoV-2 public health emergency.  Safety protocols were in place, including screening questions prior to the visit, additional usage of staff PPE, and extensive cleaning of exam room while observing appropriate contact time as indicated for disinfecting solutions.   I'm seeing this patient by the request  of:  [name], DO  CC: Bilateral knee pain  DJS:[id number]  05/05/2021 Bilateral injections given today and tolerated the procedure well.  The patient developed the elevated blood sugar secondary to oral anti-inflammatories.  We discussed with patient again to monitor her blood sugars.  Patient is on insulin and is able to Adjust accordingly.  Patient knows to seek medical attention for any type of worsening pain as well.  Patient could be a candidate as well for viscosupplementation again and we will try to get approval. Patient states that during any of this she has not had elevated pressures in her eyes.  Patient is following up with her ophthalmologist this week and knows to discuss with them as well.  Do not think that anything else will be necessary with them actually putting her on prednisone initially.  Update 06/18/2021 Andrea Marks is a 70 y.o. female coming in with complaint of B knee pain. Patient here for Durolane injections. Patient states that her L knee pain is constant and worse than last visit. Patient did apply bandage to leg and feels this exacerbated her pain. Pain is radiating into the quad and down into the foot on L side. Prednisone helped temporarily.  \     Past Medical History:  Diagnosis Date   Depression    Diabetes mellitus    GERD (gastroesophageal reflux disease)    Hyperlipidemia    Hypertension    Obesity    Past  Surgical History:  Procedure Laterality Date   ABDOMINAL HYSTERECTOMY  1991   BSO   Social History   Socioeconomic History   Marital status: Married    Spouse name: Not on file   Number of children: 0   Years of education: 13   Highest education level: Not on file  Occupational History   Occupation: retired from [employer]or, [employer]: RETIRED  Tobacco Use   Smoking status: Former    Types: Cigarettes    Quit date: 1995    Years since quitting: 27.6   Smokeless tobacco: Never  Substance and Sexual Activity   Alcohol use: No   Drug use: No   Sexual activity: Yes    Partners: Male  Other Topics Concern   Not on file  Social History Narrative   Lives with husband   Right Handed   Drinks 3-5 cups caffeine daily   Social Determinants of Health   Financial Resource Strain: Low Risk    Difficulty of Paying Living Expenses: Not hard at all  Food Insecurity: No Food Insecurity   Worried About Charity fundraiser in the Last Year: Never true   Arboriculturist in the Last Year: Never true  Transportation Needs: No Transportation Needs   Lack of Transportation (Medical): No   Lack of Transportation (Non-Medical): No  Physical Activity: Inactive   Days of Exercise per Week: 0 days   Minutes of Exercise per Session: 0 min  Stress: No Stress Concern Present  Feeling of Stress : Not at all  Social Connections: Moderately Isolated   Frequency of Communication with Friends and Family: More than three times a week   Frequency of Social Gatherings with Friends and Family: Once a week   Attends Religious Services: Never   Marine scientist or Organizations: No   Attends Music therapist: Never   Marital Status: Married   Allergies  Allergen Reactions   Hydrocodone-Acetaminophen Other (See Comments)    unknown   Oxycodone Hcl Other (See Comments)    unknown   Penicillins Other (See Comments)    unknown   Prednisone     Patient has noted allergy to  prednisone but she says it was a questionable history of mild nausea with prednisone and nothing more    Family History  Problem Relation Age of Onset   Macular degeneration Mother    Heart disease Mother        syncope   Aortic stenosis Mother    Lung disease Father 22       mesothelioma   Cancer Maternal Aunt        stomach   Heart disease Paternal Uncle        cabg   Diabetes Paternal Uncle    Heart disease Paternal Uncle    Diabetes Paternal Uncle    Diabetes Paternal Uncle    Heart disease Paternal Uncle    Heart disease Paternal Uncle    Heart disease Paternal Uncle    Lung cancer Other        asbestos   Diabetes Paternal Grandmother    Cancer Other        lung    Current Outpatient Medications (Endocrine & Metabolic):    estradiol (ESTRACE) 1 MG tablet, Take 1 tablet (1 mg total) by mouth daily.   insulin aspart (NOVOLOG FLEXPEN) 100 UNIT/ML FlexPen, Inject 18-24 Units into the skin 3 (three) times daily with meals.   Insulin Glargine (BASAGLAR KWIKPEN) 100 UNIT/ML, Inject 60 Units into the skin at bedtime.   metFORMIN (GLUCOPHAGE) 850 MG tablet, Take 1 tablet in the AM, and 2 tablets in the PM.   Semaglutide, 1 MG/DOSE, (OZEMPIC, 1 MG/DOSE,) 4 MG/3ML SOPN, INJECT 1MG SUBCUTANEOUSLY  ONCE A WEEK.   Current Outpatient Medications (Cardiovascular):    benazepril (LOTENSIN) 20 MG tablet, TAKE 1 TABLET DAILY   ezetimibe-simvastatin (VYTORIN) 10-40 MG tablet, TAKE 1 TABLET AT BEDTIME   fenofibrate 160 MG tablet, TAKE 1 TABLET DAILY    Current Facility-Administered Medications (Respiratory):    ipratropium-albuterol (DUONEB) 0.5-2.5 (3) MG/3ML nebulizer solution 3 mL  Current Outpatient Medications (Analgesics):    acetaminophen (TYLENOL) 650 MG CR tablet, Take 650 mg by mouth 2 (two) times daily as needed for pain. For pain   aspirin 81 MG tablet, Take 81 mg by mouth daily.     Current Outpatient Medications (Other):    Blood Glucose Monitoring Suppl (ONE  TOUCH ULTRA SYSTEM KIT) W/DEVICE KIT, 1 kit by Does not apply route once.   Continuous Blood Gluc Receiver (FREESTYLE LIBRE 14 DAY READER) DEVI, 1 Device by Does not apply route See admin instructions. Use for continuous monitor glucose   Continuous Blood Gluc Sensor (FREESTYLE LIBRE 14 DAY SENSOR) MISC, Apply new sensor every 14 days for continuous glucose monitoring.   FLUoxetine (PROZAC) 20 MG tablet, Take 3 tablets (60 mg total) by mouth daily.   glucose blood (ONETOUCH ULTRA) test strip, Use to check blood sugar 3 times a  day   Insulin Pen Needle (PEN NEEDLES) 32G X 4 MM MISC, USE AS INSTRUCTED WITH LANCETS.   pantoprazole (PROTONIX) 40 MG tablet, TAKE 1 TABLET DAILY    Reviewed prior external information including notes and imaging from  primary care provider As well as notes that were available from care everywhere and other healthcare systems.  Past medical history, social, surgical and family history all reviewed in electronic medical record.  No pertanent information unless stated regarding to the chief complaint.   Review of Systems:  No headache, visual changes, nausea, vomiting, diarrhea, constipation, dizziness, abdominal pain, skin rash, fevers, chills, night sweats, weight loss, swollen lymph nodes,  chest pain, shortness of breath, mood changes. POSITIVE muscle aches, body aches, joint swelling  Objective  Blood pressure (!) 148/72, pulse 84, height _0  (1.6 m), weight 282 lb (127.9 kg), SpO2 96 %.   General: No apparent distress alert and oriented x3 mood and affect normal, dressed appropriately.  Morbidly obese HEENT: Pupils equal, extraocular movements intact  Respiratory: Patient's speak in full sentences and does not appear short of breath  Cardiovascular: No lower extremity edema, non tender, no erythema  Gait severely antalgic walking with the aid of a cane MSK: Low back exam does have some mild loss lordosis. Knee: Bilateral valgus deformity noted. Large thigh  to calf ratio.  Left side has marked an effusion.  No tenderness.  More calf pain noted as well on the left side today. Tender to palpation over medial and PF joint line.  ROM full in flexion and extension and lower leg rotation. instability with valgus force.  painful patellar compression. Patellar glide with moderate crepitus. Patellar and quadriceps tendons unremarkable. Hamstring and quadriceps strength is normal.  After informed written and verbal consent, patient was seated on exam table. Right knee was prepped with alcohol swab and utilizing anterolateral approach, patient's right knee space was injected with Durolane (hyaluronic acid) 60 mg per 3 mL. Patient tolerated the procedure well without immediate complications.  After informed written and verbal consent, patient was seated on exam table. Left knee was prepped with alcohol swab and utilizing anterolateral approach, patient's left knee space was injected with Durolane (hyaluronic acid) 60 mg per 3 mL. Patient tolerated the procedure well without immediate complications.   Impression and Recommendations:     The above documentation has been reviewed and is accurate and complete Lyndal Pulley, DO

## 2021-06-18 ENCOUNTER — Ambulatory Visit (INDEPENDENT_AMBULATORY_CARE_PROVIDER_SITE_OTHER): Payer: Medicare Other | Admitting: Family Medicine

## 2021-06-18 ENCOUNTER — Encounter: Payer: Self-pay | Admitting: Family Medicine

## 2021-06-18 ENCOUNTER — Ambulatory Visit (INDEPENDENT_AMBULATORY_CARE_PROVIDER_SITE_OTHER): Payer: Medicare Other

## 2021-06-18 ENCOUNTER — Other Ambulatory Visit: Payer: Self-pay

## 2021-06-18 VITALS — BP 148/72 | HR 84 | Ht 63.0 in | Wt 282.0 lb

## 2021-06-18 DIAGNOSIS — M7989 Other specified soft tissue disorders: Secondary | ICD-10-CM

## 2021-06-18 DIAGNOSIS — M170 Bilateral primary osteoarthritis of knee: Secondary | ICD-10-CM

## 2021-06-18 IMAGING — DX DG KNEE 3 VIEWS*L*
3 series · 3 of 3 positions shown · non-contrast
Comparison: Left knee radiograph [DATE]

CLINICAL DATA: Chronic left knee pain, worse for the last week.
Left leg swelling.

EXAM:
LEFT KNEE - 3 VIEW

[knee ap]
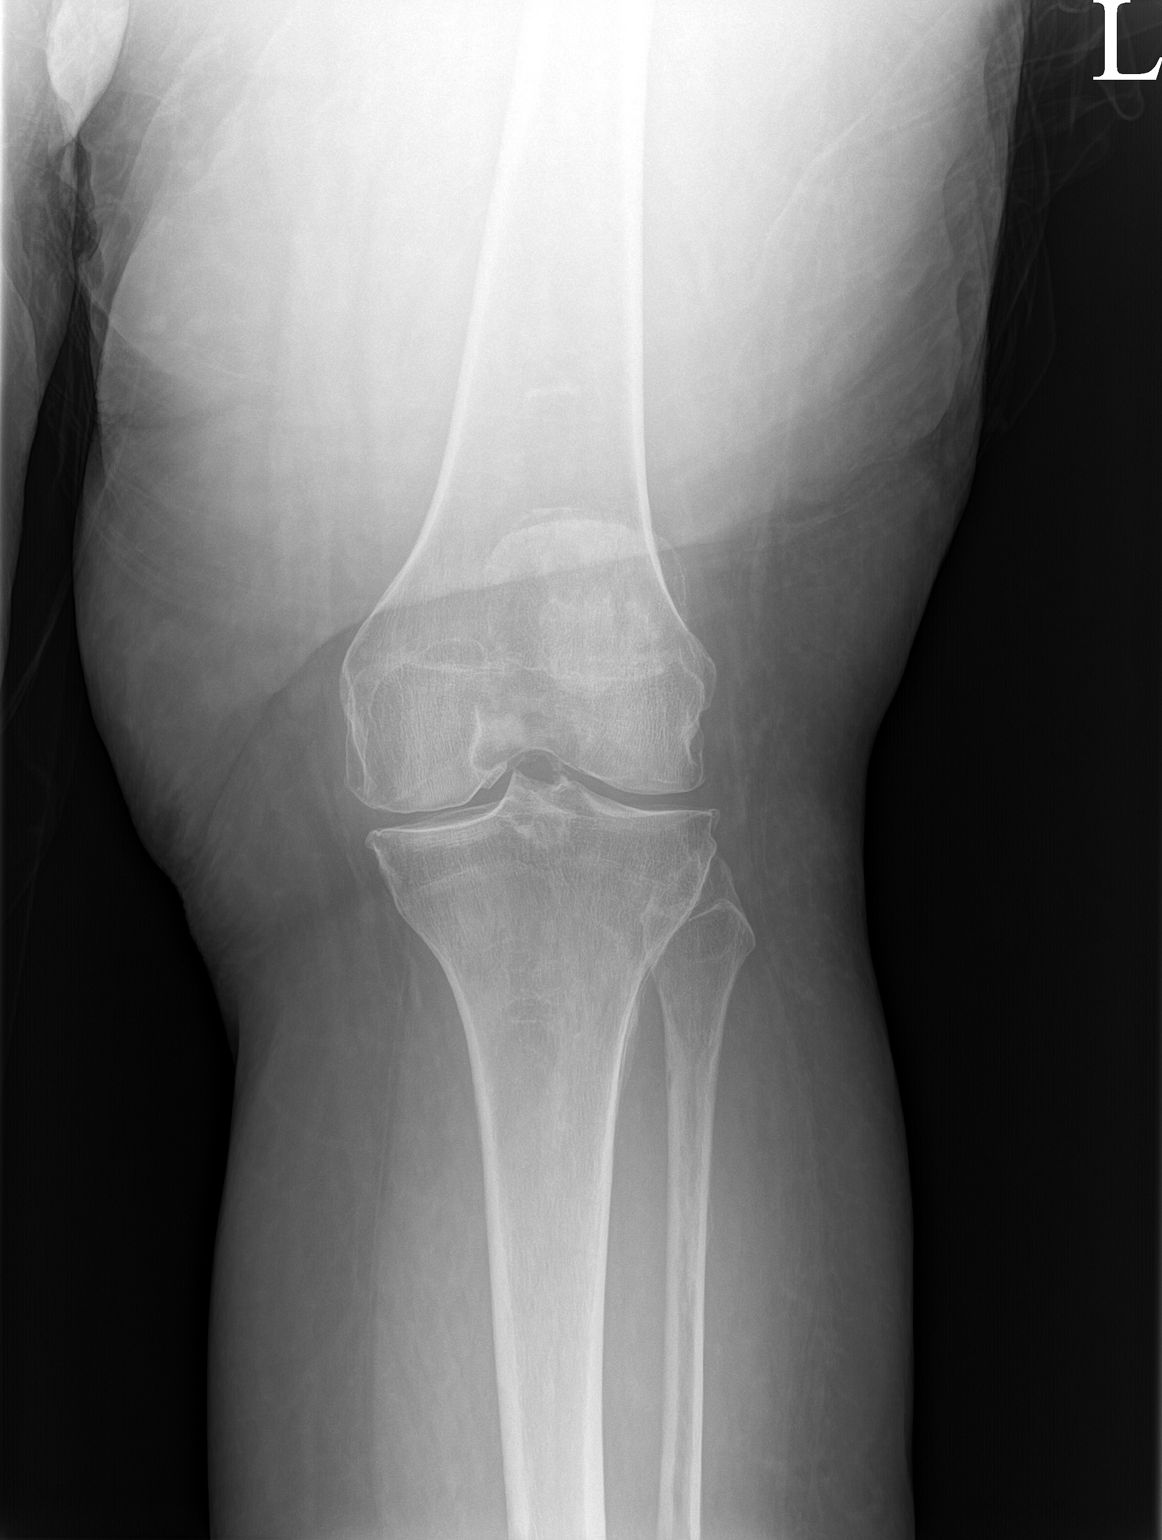

[knee lat]
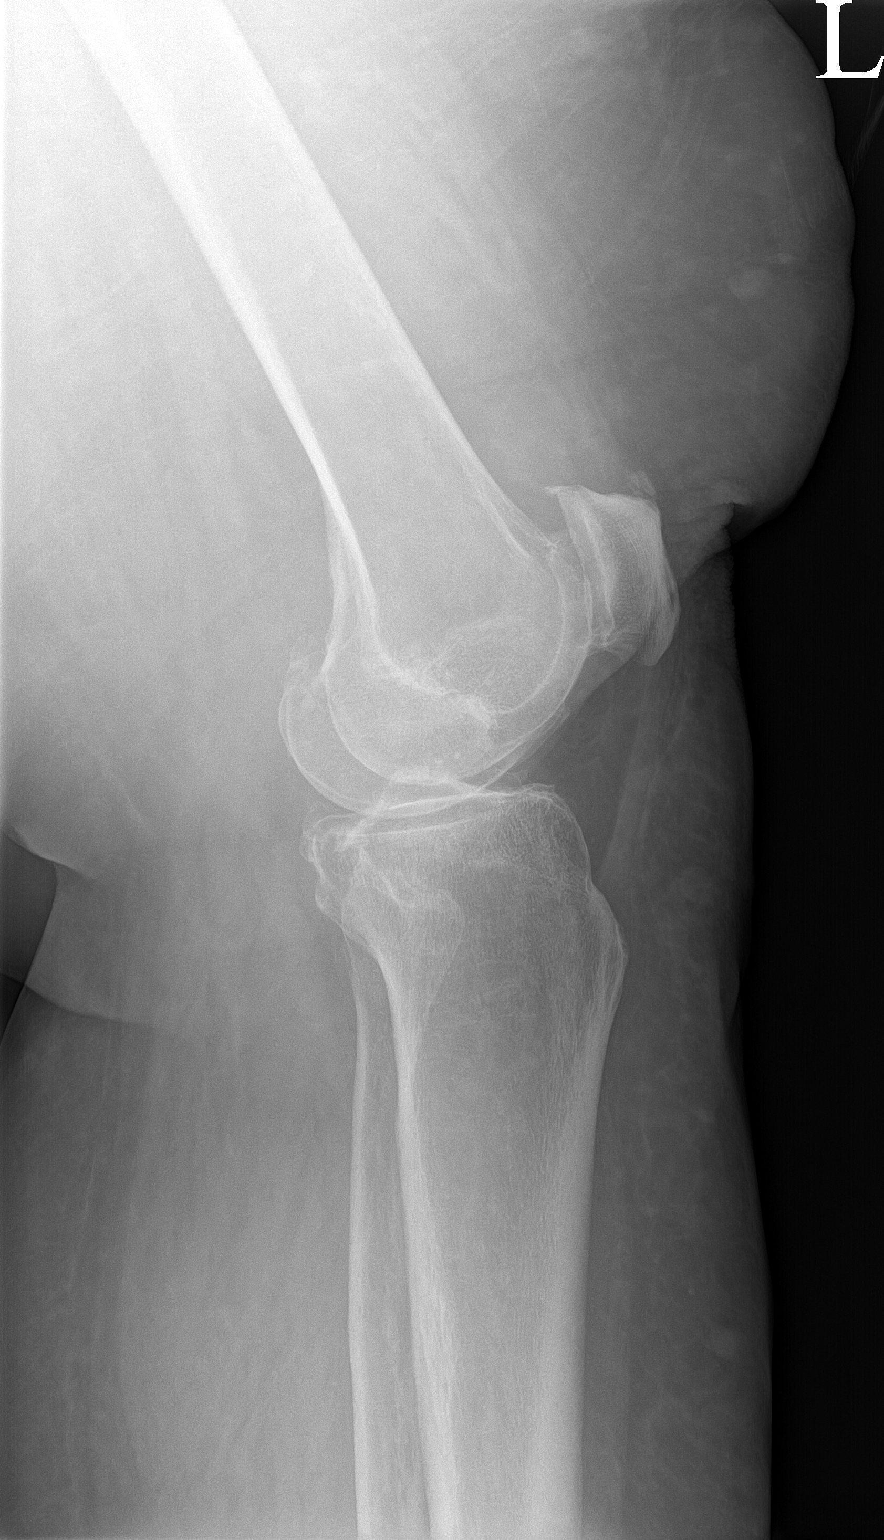

[patella]
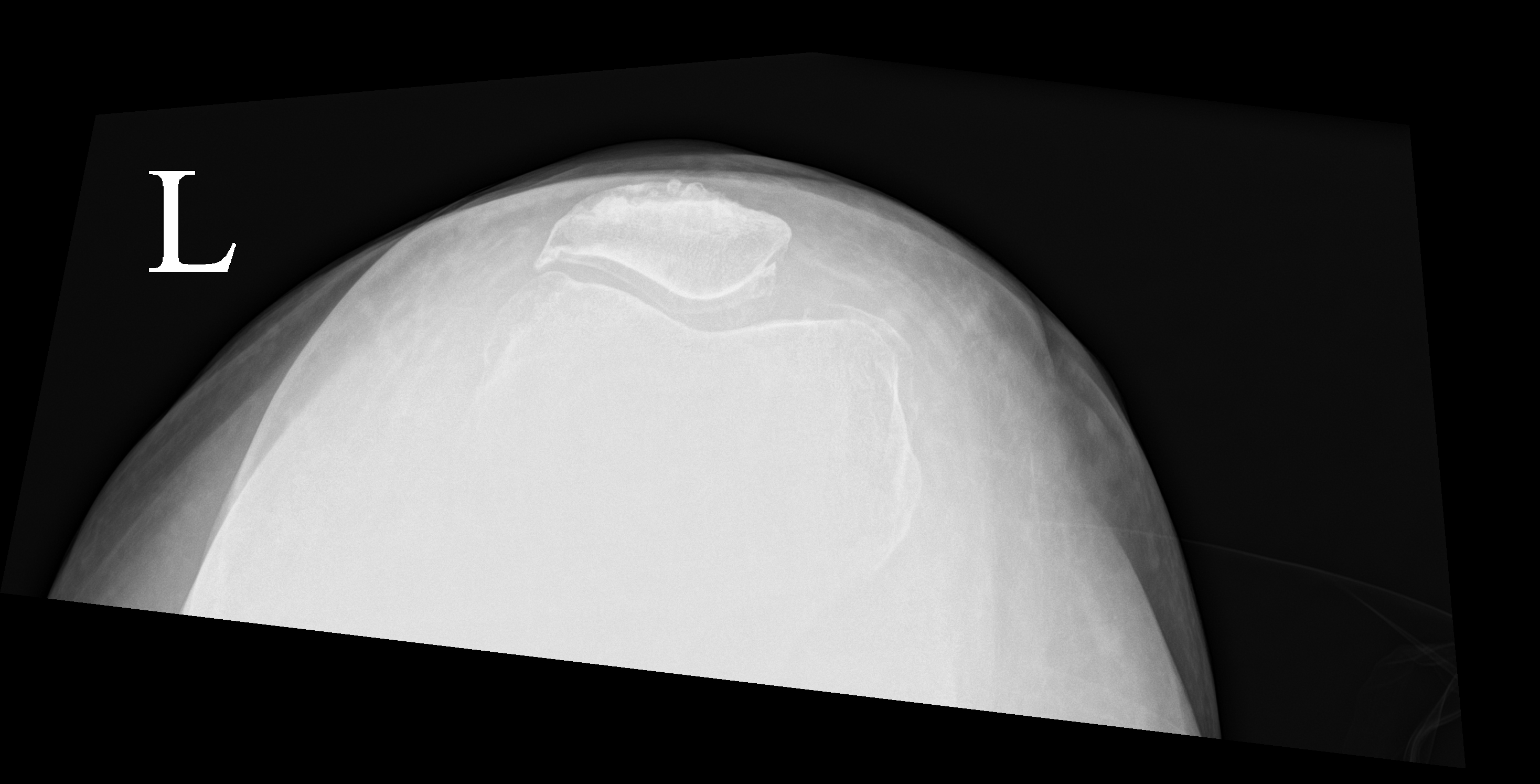

[3 of 3 positions shown; findings below may reference images not displayed]

FINDINGS: Normal alignment. Similar medial tibiofemoral joint space narrowing.
There is moderate tricompartmental peripheral spurring as well as
spurring of the tibial spines. Small knee joint effusion. No
fracture, erosion, or focal bone abnormality
IMPRESSION: Moderate tricompartmental osteoarthritis with small joint effusion.

## 2021-06-18 NOTE — Assessment & Plan Note (Signed)
Patient given viscosupplementation again.  X-rays have shown moderate arthritic changes of the knees bilaterally.  Patient notes having chronic worsening pain of the left knee today.  Patient does have a small effusion noted.  Does have pain in the calf with this as well as some mild increase in swelling.  We will get a Doppler to rule out with patient being a fairly high risk.  Patient was doing bracing as well as and sedentary lifestyle.  Patient will follow up with me again 6 weeks otherwise

## 2021-06-18 NOTE — Patient Instructions (Addendum)
Durolane injection today L knee xray Doppler L LE See me again in 8-10 weeks Can do steroid if need If any signs of infection occur such as redness, swelling, heat, or pain, please seek medical attention or go into emergency room for further evaluation  [facility]  ([facility] [employer] in [facility]) [address] [place], [place] [address] [phone]

## 2021-06-19 ENCOUNTER — Ambulatory Visit (HOSPITAL_COMMUNITY)
Admission: RE | Admit: 2021-06-19 | Discharge: 2021-06-19 | Disposition: A | Discharge status: Home / Self Care | Payer: Medicare Other | Source: Ambulatory Visit | Attending: Cardiovascular Disease | Admitting: Cardiovascular Disease

## 2021-06-19 ENCOUNTER — Other Ambulatory Visit: Payer: Self-pay

## 2021-06-19 DIAGNOSIS — M7989 Other specified soft tissue disorders: Secondary | ICD-10-CM | POA: Diagnosis not present

## 2021-06-23 ENCOUNTER — Encounter: Payer: Self-pay | Admitting: Family Medicine

## 2021-06-25 ENCOUNTER — Other Ambulatory Visit: Payer: Self-pay

## 2021-06-25 DIAGNOSIS — G8929 Other chronic pain: Secondary | ICD-10-CM

## 2021-06-25 DIAGNOSIS — M25562 Pain in left knee: Secondary | ICD-10-CM

## 2021-06-25 NOTE — Progress Notes (Unsigned)
MRI Ordered. 

## 2021-06-28 ENCOUNTER — Other Ambulatory Visit: Payer: Self-pay

## 2021-06-28 ENCOUNTER — Ambulatory Visit
Admission: RE | Admit: 2021-06-28 | Discharge: 2021-06-28 | Disposition: A | Discharge status: Home / Self Care | Payer: Medicare Other | Source: Ambulatory Visit | Attending: Family Medicine | Admitting: Family Medicine

## 2021-06-28 DIAGNOSIS — G8929 Other chronic pain: Secondary | ICD-10-CM

## 2021-06-28 DIAGNOSIS — M25562 Pain in left knee: Secondary | ICD-10-CM

## 2021-06-28 IMAGING — MR MR KNEE*L* W/O CM
4 of 7 series · 23 of 40 positions shown · non-contrast
Comparison: Knee MRI [DATE]

CLINICAL DATA: Knee instability left knee pain

EXAM:
MRI OF THE LEFT KNEE WITHOUT CONTRAST
TECHNIQUE: Multiplanar, multisequence MR imaging of the knee was performed. No
intravenous contrast was administered.

[Series 4: T2 fat-sat · coronal · 4.0mm · 0.59mm/px · 6 of 25 slices shown (1 of 2)]
[im 1/25]
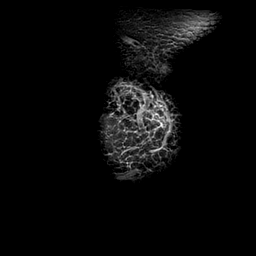
[im 5/25]
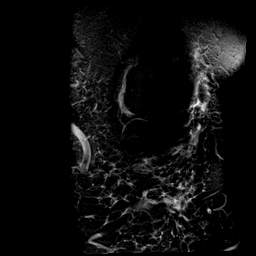
[im 10/25]
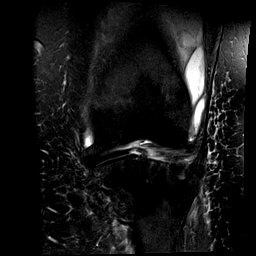
[im 15/25]
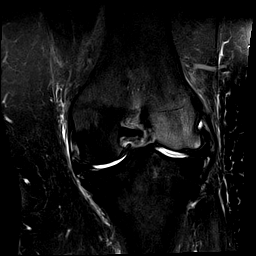
[im 20/25]
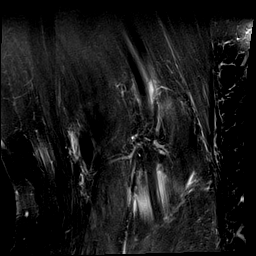
[im 25/25]
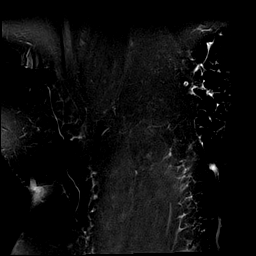

[Series 5: T1 · coronal · 4.0mm · 0.29mm/px · 3 of 25 slices shown]
[im 5/25]
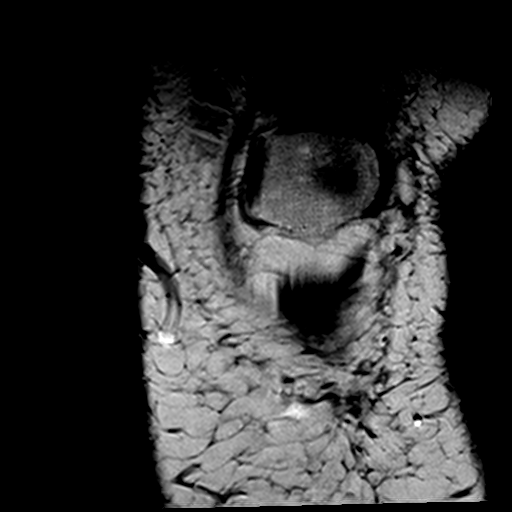
[im 15/25]
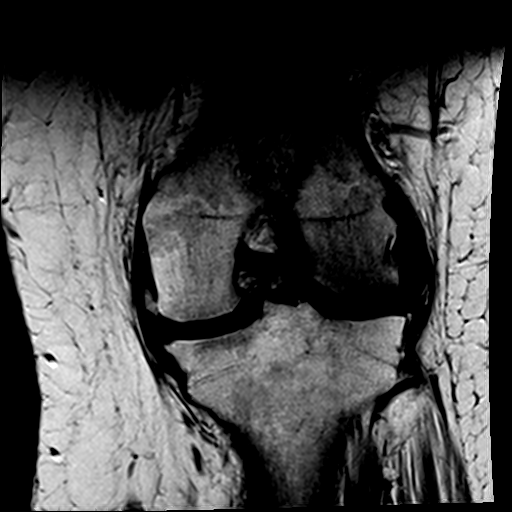
[im 25/25]
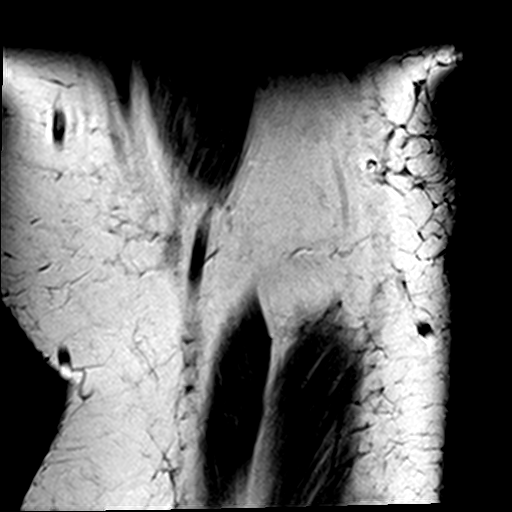

[Series 7: PD fat-sat · sagittal · 3.0mm · 0.29mm/px · 7 of 28 slices shown]
[im 1/28]
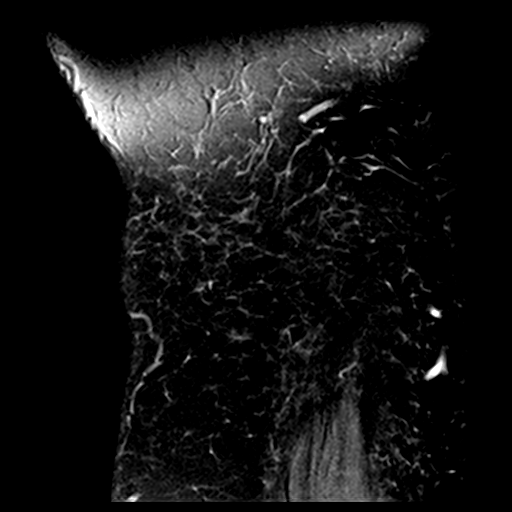
[im 5/28]
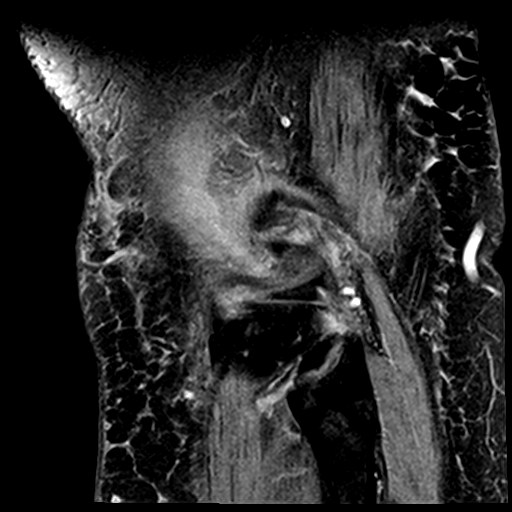
[im 10/28]
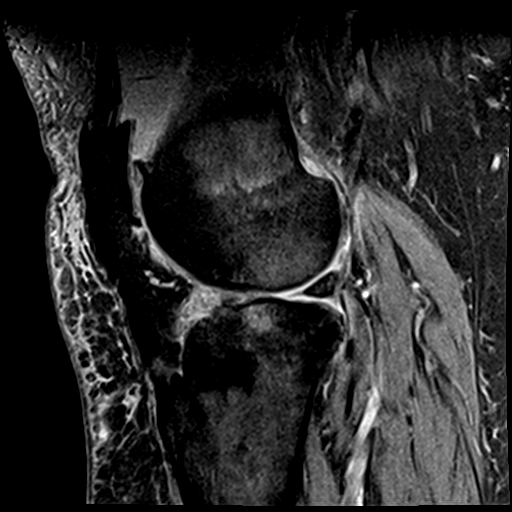
[im 14/28]
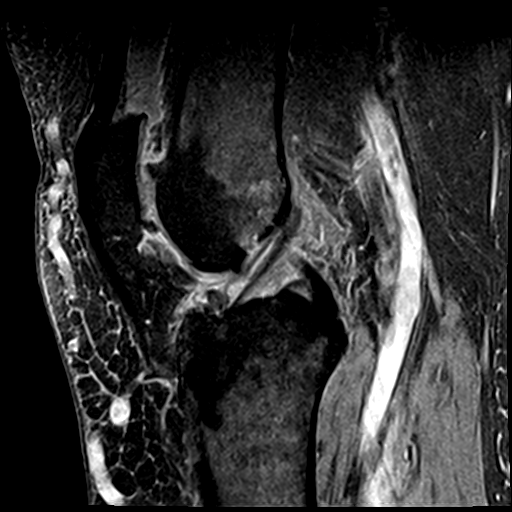
[im 19/28]
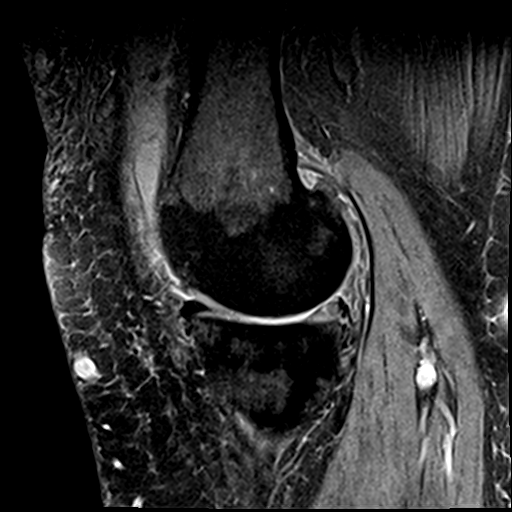
[im 23/28]
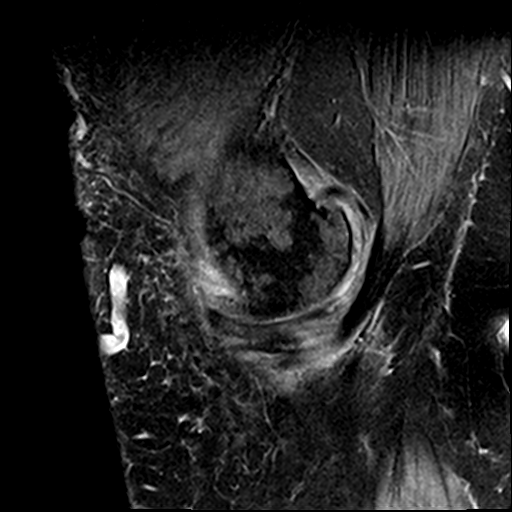
[im 28/28]
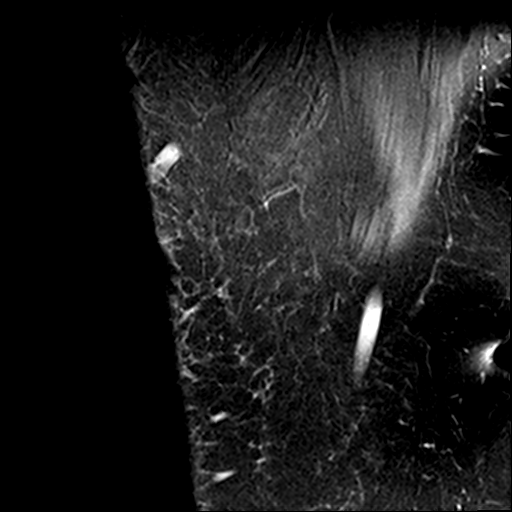

[Series 8: T2 fat-sat · sagittal · 3.0mm · 0.29mm/px · 7 of 28 slices shown (2 of 2)]
[im 1/28]
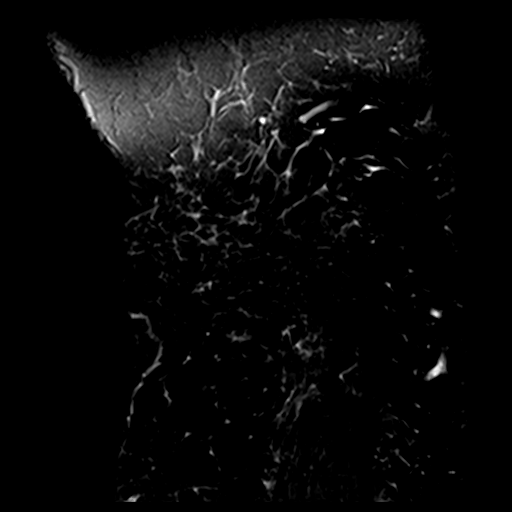
[im 5/28]
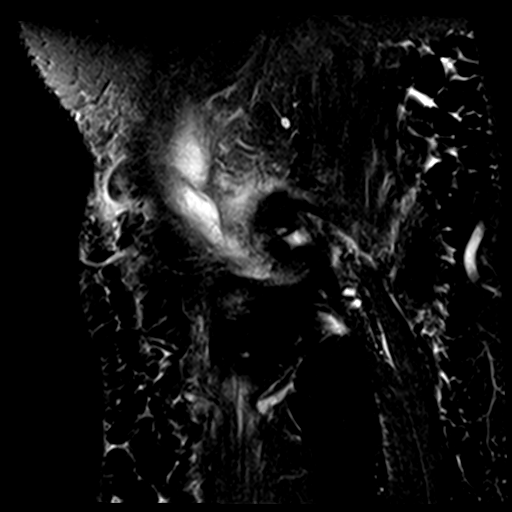
[im 10/28]
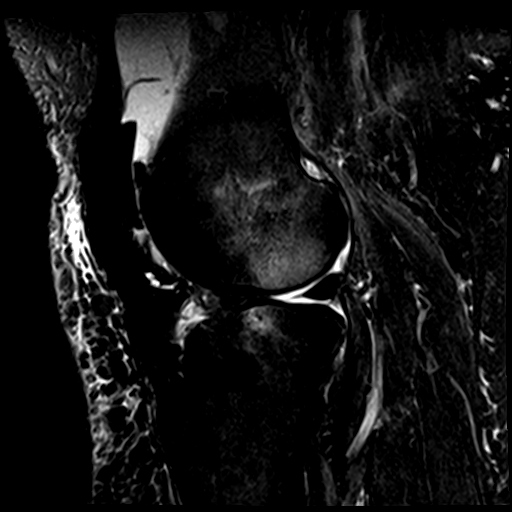
[im 14/28]
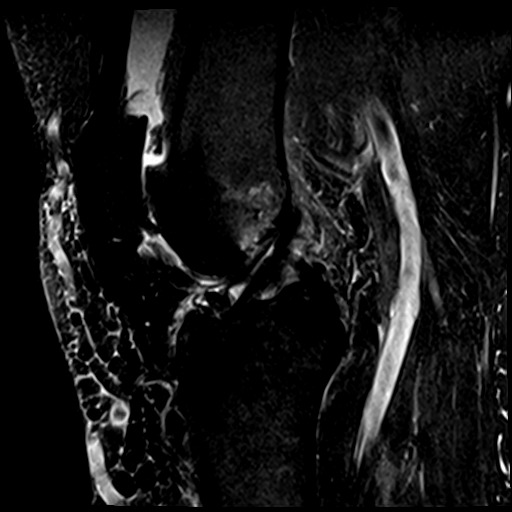
[im 19/28]
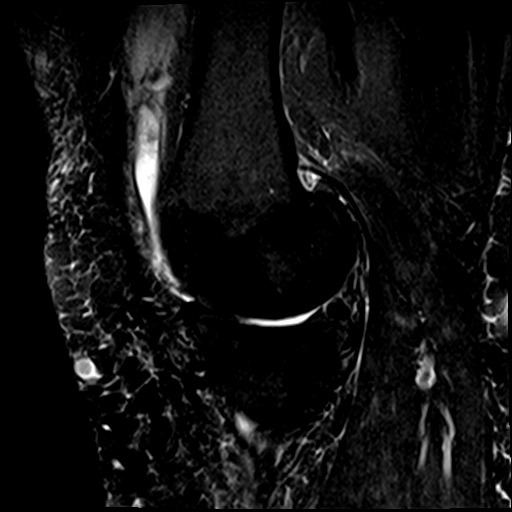
[im 23/28]
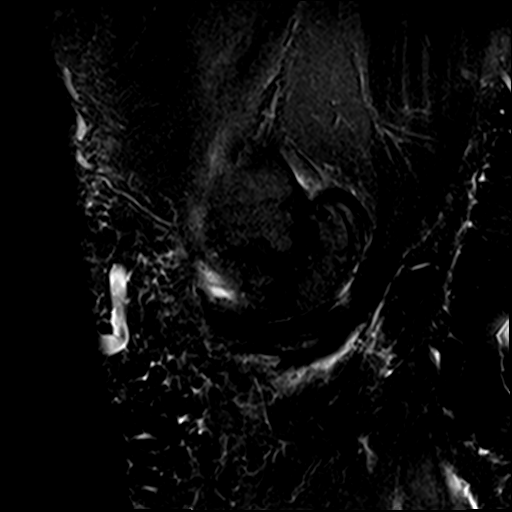
[im 28/28]
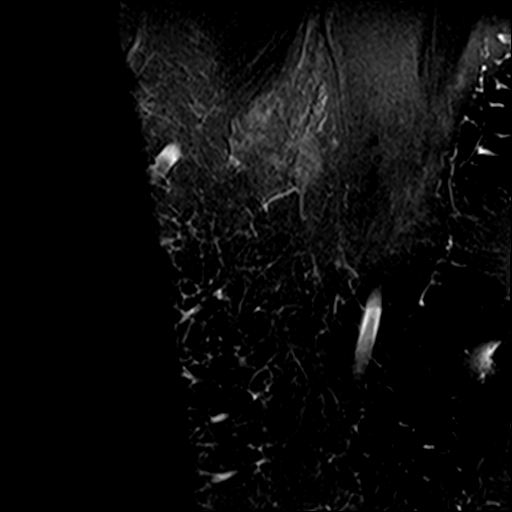

[23 of 40 positions shown; findings below may reference images not displayed]

FINDINGS: MENISCI

Medial: Complex degenerative tearing of the posterior horn and body
of the medial meniscus, with radial tear near the meniscal root.
Mild extrusion.

Lateral: Macerated anterior horn lateral meniscus and anterior
horn-body junction. Mild extrusion.

LIGAMENTS

Cruciates: ACL and PCL are intact.

Collaterals: Medial collateral ligament is intact. Lateral
collateral ligament complex is intact.

CARTILAGE

Patellofemoral: Moderate chondrosis with partial-thickness cartilage
loss.

Medial: Near full-thickness cartilage loss along the posterior
weight-bearing medial femoral condyle and the tibial.

Lateral: Full-thickness cartilage loss along the posterior
weight-bearing lateral femoral condyle and the tibial.

JOINT: Moderate-sized joint effusion.

POPLITEAL FOSSA: Miniscule Baker cyst.

EXTENSOR MECHANISM: Intact quadriceps tendon. Intact patellar
tendon.

BONES: Tricompartment osteophyte formation. There is a small
subchondral fracture along the posterior weight-bearing lateral
femoral condyle. Extensive adjacent bony edema.

Other: No additional findings.
IMPRESSION: Subchondral fracture along the posterior weight-bearing lateral
femoral condyle, with extensive adjacent bony edema.

Macerated anterior horn lateral meniscus and anterior horn-body
junction.

Complex degenerative tearing of the posterior horn and body of the
medial meniscus with radial tear near the meniscal root.

Tricompartment osteoarthritis, with severe cartilaginous
abnormalities as described above.

Moderate-sized joint effusion.

## 2021-06-29 ENCOUNTER — Encounter: Payer: Self-pay | Admitting: Family Medicine

## 2021-06-30 ENCOUNTER — Encounter: Payer: Self-pay | Admitting: Family Medicine

## 2021-06-30 ENCOUNTER — Other Ambulatory Visit: Payer: Self-pay

## 2021-06-30 ENCOUNTER — Ambulatory Visit (INDEPENDENT_AMBULATORY_CARE_PROVIDER_SITE_OTHER): Payer: Medicare Other | Admitting: Family Medicine

## 2021-06-30 DIAGNOSIS — M170 Bilateral primary osteoarthritis of knee: Secondary | ICD-10-CM | POA: Diagnosis not present

## 2021-06-30 MED ORDER — VITAMIN D (ERGOCALCIFEROL) 1.25 MG (50000 UNIT) PO CAPS: 50000.0000 [IU] | ORAL_CAPSULE | ORAL | 0 refills | Status: DC

## 2021-06-30 MED ORDER — AMBULATORY NON FORMULARY MEDICATION: 1.0000 [IU] | Freq: Once | 0 refills | Status: AC

## 2021-06-30 NOTE — Progress Notes (Signed)
Virtual Visit via Video Note  I connected with Andrea Marks on 06/30/21 at  4:00 PM EDT by a video enabled telemedicine application and verified that I am speaking with the correct person using two identifiers. Patient is along movement on her phone call secondary to difficulty with the virtual. Location: Patient: Patient is at home.  No difficulty with virtual platform. Provider: In office   I discussed the limitations of evaluation and management by telemedicine and the availability of in person appointments. The patient expressed understanding and agreed to proceed.  History of Present Illness: Patient is a 70 year old female who is having left knee pain.  Left-sided seem to get worse.  Did not respond well to the viscosupplementation and sent to get an MRI.  MRI was independently visualized by me showing a subchondral fracture along the posterior weightbearing aspect of the lateral femoral condyle.  Patient also has severe osteoarthritic changes of the tricompartmental with a moderate joint effusion.    Observations/Objective: Alert and oriented x3, asks good questions about the neck steps.   Assessment and Plan: 70 year old female with a subchondral fracture and severe arthritic changes of the knee.  Patient has already failed steroid injections, viscosupplementation, and patient is in need of weight loss before any type of replacement can be done.  Will refer patient to orthopedic surgery to see if anything else can be done but at the moment we will start once weekly vitamin D supplementation as well as a Rollator to help her get around the house.  Follow-up with me again in 4 weeks in the office.   Follow Up Instructions: 4 weeks in the office    I discussed the assessment and treatment plan with the patient. The patient was provided an opportunity to ask questions and all were answered. The patient agreed with the plan and demonstrated an understanding of the  instructions.   The patient was advised to call back or seek an in-person evaluation if the symptoms worsen or if the condition fails to improve as anticipated.  I provided 31 minutes of non-face-to-face time during this encounter including review review of patient's imaging including the MRI and patient's previous office visits and treatment options.   Judi Saa, DO

## 2021-07-01 ENCOUNTER — Ambulatory Visit: Payer: Medicare Other | Admitting: Adult Health

## 2021-07-07 ENCOUNTER — Ambulatory Visit: Payer: Medicare Other | Admitting: Family Medicine

## 2021-07-07 ENCOUNTER — Telehealth: Payer: Self-pay

## 2021-07-07 NOTE — Telephone Encounter (Signed)
Inbound fax requesting forms be completed and faxed with recent clinical notes. Clinical routed via Epic to [phone]

## 2021-07-14 ENCOUNTER — Encounter: Payer: Self-pay | Admitting: Family Medicine

## 2021-07-24 ENCOUNTER — Encounter: Payer: Self-pay | Admitting: Podiatry

## 2021-07-24 ENCOUNTER — Other Ambulatory Visit: Payer: Self-pay

## 2021-07-24 ENCOUNTER — Ambulatory Visit (INDEPENDENT_AMBULATORY_CARE_PROVIDER_SITE_OTHER): Payer: Medicare Other | Admitting: Podiatry

## 2021-07-24 DIAGNOSIS — E1151 Type 2 diabetes mellitus with diabetic peripheral angiopathy without gangrene: Secondary | ICD-10-CM | POA: Diagnosis not present

## 2021-07-24 DIAGNOSIS — M79676 Pain in unspecified toe(s): Secondary | ICD-10-CM

## 2021-07-24 DIAGNOSIS — Z794 Long term (current) use of insulin: Secondary | ICD-10-CM

## 2021-07-24 DIAGNOSIS — B351 Tinea unguium: Secondary | ICD-10-CM

## 2021-07-24 NOTE — Progress Notes (Signed)
This patient returns to my office for at risk foot care.  This patient requires this care by a professional since this patient will be at risk due to having diabetes and history of thrombophlebitis.  This patient is unable to cut nails herself since the patient cannot reach her nails.These nails are painful walking and wearing shoes.  This patient presents for at risk foot care today.  General Appearance  Alert, conversant and in no acute stress.  Vascular  Dorsalis pedis and posterior tibial  pulses are not  palpable  Bilaterally  Due to swelling..  Capillary return is within normal limits  bilaterally. Temperature is within normal limits  bilaterally.  Neurologic  Senn-Weinstein monofilament wire test diminished  bilaterally. Muscle power within normal limits bilaterally.  Nails Thick disfigured discolored nails with subungual debris  from hallux to fifth toes bilaterally. No evidence of bacterial infection or drainage bilaterally.  Orthopedic  No limitations of motion  feet .  No crepitus or effusions noted.  No bony pathology or digital deformities noted.  Skin  normotropic skin with no porokeratosis noted bilaterally.  No signs of infections or ulcers noted.   Pinch callus  B/L.   No infection or drainage noted.  Onychomycosis  Pain in right toes  Pain in left toes  Skin trauma 3rd toenail left foot.  Consent was obtained for treatment procedures.   Mechanical debridement of nails 1-5  bilaterally performed with a nail nipper.  Filed with dremel without incident.    Return office visit   9 weeks                  Told patient to return for periodic foot care and evaluation due to potential at risk complications.   Jaine Estabrooks DPM  

## 2021-07-27 NOTE — Progress Notes (Signed)
[place] Phone: [phone] Subjective:   Andrea Marks, am serving as a scribe for Dr. Hulan Saas. This visit occurred during the SARS-CoV-2 public health emergency.  Safety protocols were in place, including screening questions prior to the visit, additional usage of staff PPE, and extensive cleaning of exam room while observing appropriate contact time as indicated for disinfecting solutions.   I'm seeing this patient by the request  of:  Andrea Held, DO  CC: Knee pain follow-up  [id number]  06/30/2021 Virtual Visit 70 year old female with a subchondral fracture and severe arthritic changes of the knee.  Patient has already failed steroid injections, viscosupplementation, and patient is in need of weight loss before any type of replacement can be done.  Will refer patient to orthopedic surgery to see if anything else can be done but at the moment we will start once weekly vitamin D supplementation as well as a Rollator to help her get around the house.  Follow-up with me again in 4 weeks in the office.  Updated 07/28/2021 Andrea Marks is a 70 y.o. female coming in with complaint of L knee pain. L knee pain continues and radiates down back of leg. Pain is constant. Patient is having hard time sleeping due to pain. Also has to climb stairs to get to her bedrooms which is hard due to pain.  Patient is able to get around better with rollator.   Mri  06/28/2021 IMPRESSION:  Subchondral fracture along the posterior weight-bearing lateral femoral condyle, with extensive adjacent bony edema.   Macerated anterior horn lateral meniscus and anterior horn-body junction.   Complex degenerative tearing of the posterior horn and body of the medial meniscus with radial tear near the meniscal root.   Tricompartment osteoarthritis, with severe cartilaginous abnormalities as described above.    Moderate-sized joint effusion.      Past Medical History:  Diagnosis Date   Depression    Diabetes mellitus    GERD (gastroesophageal reflux disease)    Hyperlipidemia    Hypertension    Obesity    Past Surgical History:  Procedure Laterality Date   ABDOMINAL HYSTERECTOMY  1991   BSO   Social History   Socioeconomic History   Marital status: Married    Spouse name: Not on file   Number of children: 0   Years of education: 13   Highest education level: Not on file  Occupational History   Occupation: retired from [employer]or, [employer]: RETIRED  Tobacco Use   Smoking status: Former    Types: Cigarettes    Quit date: 1995    Years since quitting: 27.7   Smokeless tobacco: Never  Substance and Sexual Activity   Alcohol use: Marks   Drug use: Marks   Sexual activity: Yes    Partners: Male  Other Topics Concern   Not on file  Social History Narrative   Lives with husband   Right Handed   Drinks 3-5 cups caffeine daily   Social Determinants of Health   Financial Resource Strain: Low Risk    Difficulty of Paying Living Expenses: Not hard at all  Food Insecurity: Marks Food Insecurity   Worried About Charity fundraiser in the Last Year: Never true   Arboriculturist in the Last Year: Never true  Transportation Needs: Marks Transportation Needs   Lack of Transportation (Medical): Marks   Lack of Transportation (Non-Medical):  Marks  Physical Activity: Inactive   Days of Exercise per Week: 0 days   Minutes of Exercise per Session: 0 min  Stress: Marks Stress Concern Present   Feeling of Stress : Not at all  Social Connections: Moderately Isolated   Frequency of Communication with Friends and Family: More than three times a week   Frequency of Social Gatherings with Friends and Family: Once a week   Attends Religious Services: Never   Marine scientist or Organizations: Marks   Attends Music therapist: Never   Marital Status: Married   Allergies  Allergen  Reactions   Hydrocodone-Acetaminophen Other (See Comments)    unknown   Oxycodone Hcl Other (See Comments)    unknown   Penicillins Other (See Comments)    unknown   Prednisone     Patient has noted allergy to prednisone but she says it was a questionable history of mild nausea with prednisone and nothing more    Family History  Problem Relation Age of Onset   Macular degeneration Mother    Heart disease Mother        syncope   Aortic stenosis Mother    Lung disease Father 95       mesothelioma   Cancer Maternal Aunt        stomach   Heart disease Paternal Uncle        cabg   Diabetes Paternal Uncle    Heart disease Paternal Uncle    Diabetes Paternal Uncle    Diabetes Paternal Uncle    Heart disease Paternal Uncle    Heart disease Paternal Uncle    Heart disease Paternal Uncle    Lung cancer Other        asbestos   Diabetes Paternal Grandmother    Cancer Other        lung    Current Outpatient Medications (Endocrine & Metabolic):    estradiol (ESTRACE) 1 MG tablet, Take 1 tablet (1 mg total) by mouth daily.   insulin aspart (NOVOLOG FLEXPEN) 100 UNIT/ML FlexPen, Inject 18-24 Units into the skin 3 (three) times daily with meals.   Insulin Glargine (BASAGLAR KWIKPEN) 100 UNIT/ML, Inject 60 Units into the skin at bedtime.   metFORMIN (GLUCOPHAGE) 850 MG tablet, Take 1 tablet in the AM, and 2 tablets in the PM.   Semaglutide, 1 MG/DOSE, (OZEMPIC, 1 MG/DOSE,) 4 MG/3ML SOPN, INJECT $RemoveBefo'1MG'BpGhpLYEoXv$  SUBCUTANEOUSLY  ONCE A WEEK.   Current Outpatient Medications (Cardiovascular):    benazepril (LOTENSIN) 20 MG tablet, TAKE 1 TABLET DAILY   ezetimibe-simvastatin (VYTORIN) 10-40 MG tablet, TAKE 1 TABLET AT BEDTIME   fenofibrate 160 MG tablet, TAKE 1 TABLET DAILY    Current Facility-Administered Medications (Respiratory):    ipratropium-albuterol (DUONEB) 0.5-2.5 (3) MG/3ML nebulizer solution 3 mL  Current Outpatient Medications (Analgesics):    acetaminophen (TYLENOL) 650 MG CR  tablet, Take 650 mg by mouth 2 (two) times daily as needed for pain. For pain   aspirin 81 MG tablet, Take 81 mg by mouth daily.     Current Outpatient Medications (Other):    Blood Glucose Monitoring Suppl (ONE TOUCH ULTRA SYSTEM KIT) W/DEVICE KIT, 1 kit by Does not apply route once.   Continuous Blood Gluc Receiver (FREESTYLE LIBRE 14 DAY READER) DEVI, 1 Device by Does not apply route See admin instructions. Use for continuous monitor glucose   Continuous Blood Gluc Sensor (FREESTYLE LIBRE 14 DAY SENSOR) MISC, Apply new sensor every 14 days for continuous glucose monitoring.  FLUoxetine (PROZAC) 20 MG tablet, Take 3 tablets (60 mg total) by mouth daily.   glucose blood (ONETOUCH ULTRA) test strip, Use to check blood sugar 3 times a day   Insulin Pen Needle (PEN NEEDLES) 32G X 4 MM MISC, USE AS INSTRUCTED WITH LANCETS.   pantoprazole (PROTONIX) 40 MG tablet, TAKE 1 TABLET DAILY   Vitamin D, Ergocalciferol, (DRISDOL) 1.25 MG (50000 UNIT) CAPS capsule, Take 1 capsule (50,000 Units total) by mouth every 7 (seven) days.    Reviewed prior external information including notes and imaging from  primary care provider As well as notes that were available from care everywhere and other healthcare systems.  Past medical history, social, surgical and family history all reviewed in electronic medical record.  Marks pertanent information unless stated regarding to the chief complaint.   Review of Systems:  Marks headache, visual changes, nausea, vomiting, diarrhea, constipation, dizziness, abdominal pain, skin rash, fevers, chills, night sweats, weight loss, swollen lymph nodes, joint swelling, chest pain, shortness of breath, mood changes. POSITIVE muscle aches, body aches  Objective  Blood pressure 124/80, pulse 92, height $RemoveBe'5\' 3"'EYbLzsASa$  (1.6 m), SpO2 97 %.   General: Marks apparent distress alert and oriented x3 mood and affect normal, dressed appropriately.  HEENT: Pupils equal, extraocular movements intact   Respiratory: Patient's speak in full sentences and does not appear short of breath  Cardiovascular: Marks lower extremity edema, non tender, Marks erythema  Gait antalgic using the aid of a Rollator Left knee exam has trace effusion noted.  Pain seems to be more on the posterior aspect of the knee.  Lacks last 5 degrees of extension and flexion.  Crepitus noted.  Marks significant calf pain.   Impression and Recommendations:    The above documentation has been reviewed and is accurate and complete Lyndal Pulley, DO

## 2021-07-28 ENCOUNTER — Encounter: Payer: Self-pay | Admitting: Family Medicine

## 2021-07-28 ENCOUNTER — Ambulatory Visit (INDEPENDENT_AMBULATORY_CARE_PROVIDER_SITE_OTHER): Payer: Medicare Other | Admitting: Family Medicine

## 2021-07-28 ENCOUNTER — Other Ambulatory Visit: Payer: Self-pay

## 2021-07-28 DIAGNOSIS — M170 Bilateral primary osteoarthritis of knee: Secondary | ICD-10-CM

## 2021-07-28 NOTE — Assessment & Plan Note (Signed)
Patient has the insufficiency fracture noted of the knee posteriorly.  Using believe that this is likely contributing to some of the right posterior knee pain at the moment.  Discussed with patient that I do feel that the  That has been beneficial and likely will be okay.  Patient does have severe arthritis of the knees and patient wants to avoid any type of surgical intervention.  Do not feel that any testing is indicated change medical management.  We will continue with conservative therapy at this time.  Follow-up again in 4 weeks with x-rays to further evaluate

## 2021-07-28 NOTE — Patient Instructions (Signed)
Overall you are doing great but will be slow and sweet  Keep using the walker  Ice at the end of a long day and before bed to maybe help with pain.  The back of the knee is likely muscle but if worsening swelling of the calf or redness, send [place] a message or seek medical attention immediately See me again in 4-5 weeks

## 2021-08-12 ENCOUNTER — Ambulatory Visit: Payer: Medicare Other | Admitting: Family Medicine

## 2021-08-14 ENCOUNTER — Encounter: Payer: Self-pay | Admitting: Family Medicine

## 2021-08-14 MED ORDER — MELOXICAM 15 MG PO TABS: 15.0000 mg | ORAL_TABLET | Freq: Every day | ORAL | 0 refills | Status: DC

## 2021-08-25 ENCOUNTER — Ambulatory Visit (INDEPENDENT_AMBULATORY_CARE_PROVIDER_SITE_OTHER): Payer: Medicare Other | Admitting: Sports Medicine

## 2021-08-25 ENCOUNTER — Ambulatory Visit: Payer: Medicare Other | Admitting: Family Medicine

## 2021-08-25 ENCOUNTER — Ambulatory Visit (INDEPENDENT_AMBULATORY_CARE_PROVIDER_SITE_OTHER): Payer: Medicare Other

## 2021-08-25 ENCOUNTER — Other Ambulatory Visit: Payer: Self-pay

## 2021-08-25 VITALS — BP 142/70 | HR 78 | Ht 63.0 in | Wt 281.0 lb

## 2021-08-25 DIAGNOSIS — M1712 Unilateral primary osteoarthritis, left knee: Secondary | ICD-10-CM

## 2021-08-25 DIAGNOSIS — M23307 Other meniscus derangements, unspecified meniscus, left knee: Secondary | ICD-10-CM

## 2021-08-25 DIAGNOSIS — M84452D Pathological fracture, left femur, subsequent encounter for fracture with routine healing: Secondary | ICD-10-CM

## 2021-08-25 IMAGING — DX DG KNEE 3 VIEWS*L*
3 series · 3 of 3 positions shown · non-contrast
Comparison: [DATE]

CLINICAL DATA: Left knee pain

EXAM:
LEFT KNEE - 3 VIEW

[knee ap]
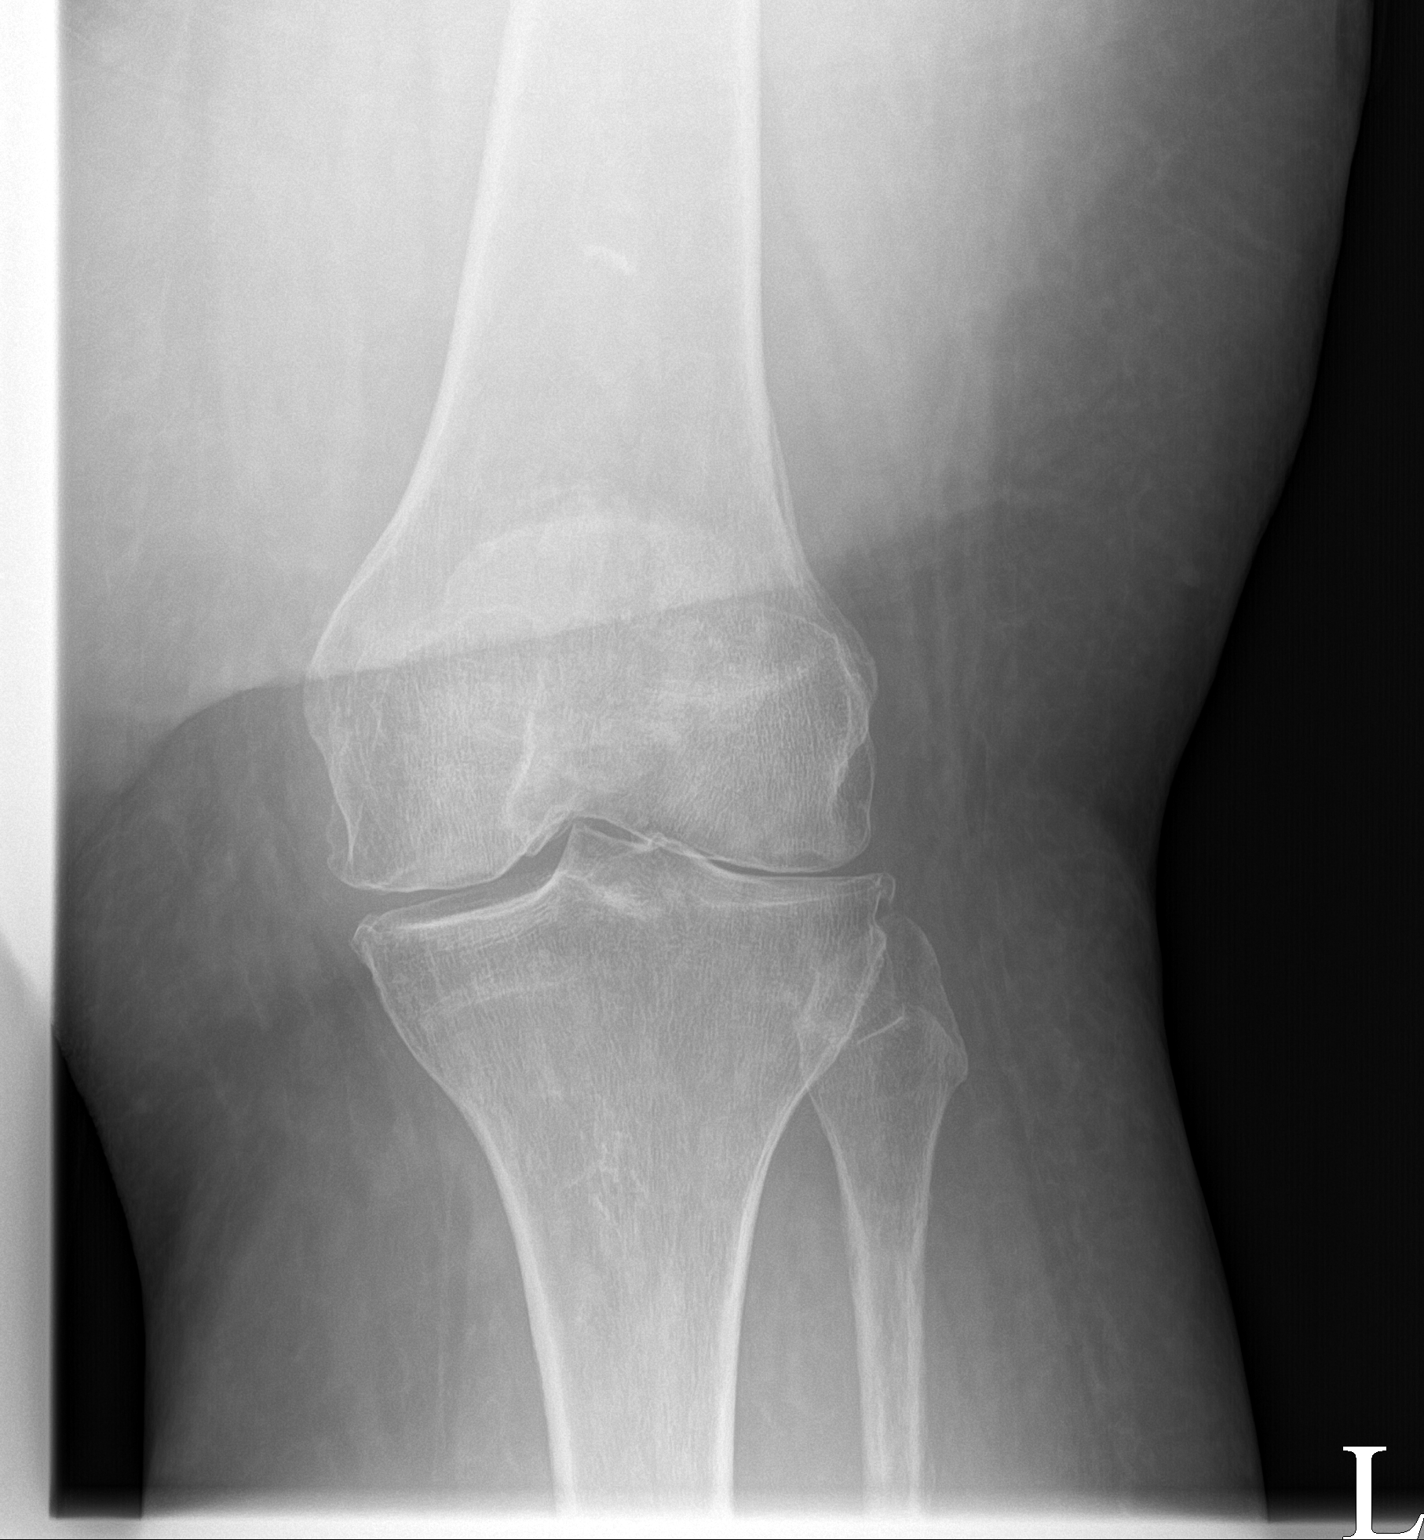

[knee lat]
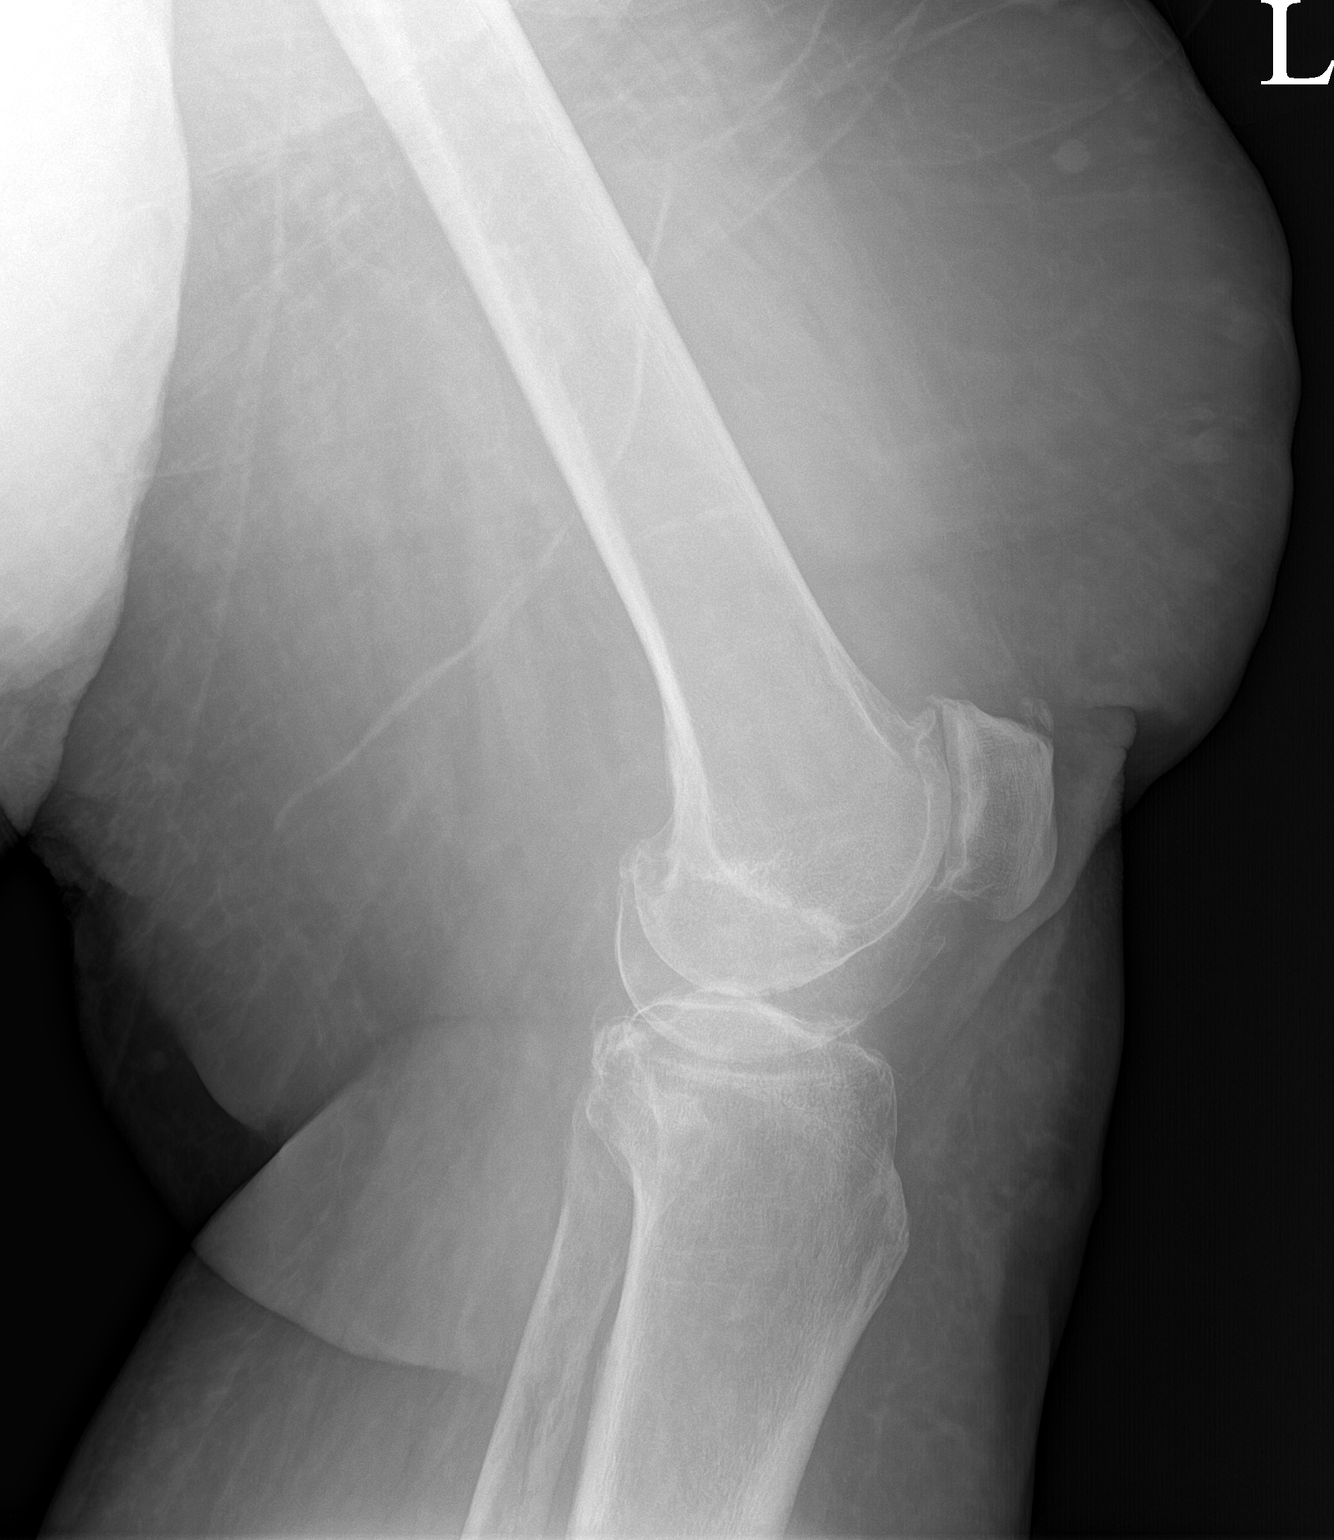

[patella]
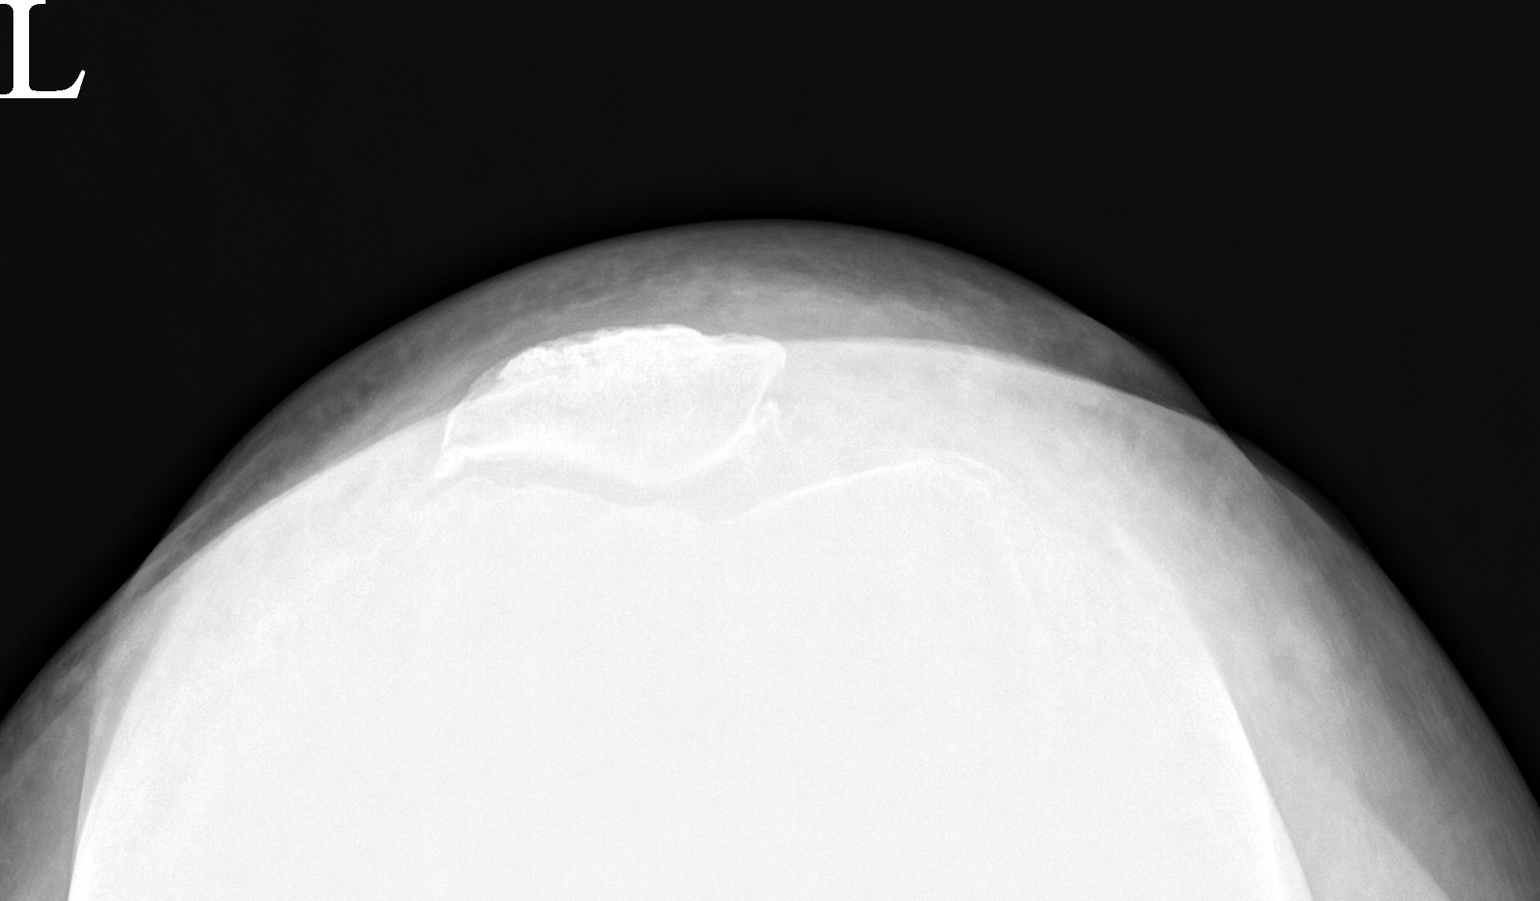

[3 of 3 positions shown; findings below may reference images not displayed]

FINDINGS: Normal alignment. No acute fracture or dislocation. Mild
tricompartmental degenerative arthritis. No effusion. Soft tissues
are unremarkable.
IMPRESSION: Mild tricompartmental degenerative arthritis.

## 2021-08-25 NOTE — Progress Notes (Signed)
Andrea Marks Phone: [phone]   Assessment and Plan:    1. Primary osteoarthritis of left knee 2. 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  My interpretation: No acute fracture, degenerative changes seen most prominently in patellofemoral and lateral compartments. - Patient elected for intra-articular knee CSI.  Tolerated well per procedure note below - Offered meloxicam +/- tramadol for pain control, however patient declines at this time - Offered prescription for cane with 4 pronged base, patient declines at this time, but states that she will call back if she decides she wants this prescription - Continue Tylenol 500 mg 2 tablets (1000 g total) 3 times daily for pain control - DG Knee 3 Views Left; Future    Procedure: Knee Joint Injection Side: Left Indication: Acute knee pain with left knee OA  Risks explained and consent was given verbally. The site was cleaned with alcohol prep. A needle was introduced with an anterio-medial approach. Injection given using 49m of 1% lidocaine without epinephrine and 137mof kenalog 4018ml. This was well tolerated and resulted in symptomatic relief.  Needle was removed, hemostasis achieved, and post injection instructions were explained.   Pt was advised to call or return to clinic if these symptoms worsen or fail to improve as anticipated.   Pertinent previous records reviewed  include knee x-ray from 06/18/2021, MRI left knee from 06/28/2021   Follow Up: Follow-up in 2 weeks for reevaluation.  At that time could consider HA injection versus referral for knee replacement if pain has not improved   Subjective:     I, Andrea Marks serving as a scribe for Dr. BenBenito Marks This visit occurred during the SARS-CoV-2 public health emergency.  Safety protocols were in place, including screening questions prior to the visit, additional usage of staff PPE, and extensive cleaning of exam room while observing appropriate contact time as indicated for disinfecting solutions.   Chief Complaint: bilateral knee pain   HPI:   08/25/21 Patient is a 70 77ar old female presenting with bilateral knee pain. Patient's husband unexpectedly died and she has been very busy. Patient had declined both meloxicam and tramadol offered by Dr. SmiTamala Julianoday patient states that her pain has increased over past month. Pain in L knee is radiating up and down leg. Right knee is also painful. Patient has had to take care of herself past month since passing of her husband.   Relevant Historical Information: Subchondral femoral fracture seen on MRI, recent passing away of her husband.  DM type II.  Additional pertinent review of systems negative.   Current Outpatient Medications:    acetaminophen (TYLENOL) 650 MG CR tablet, Take 650 mg by mouth 2 (two) times daily as needed for pain. For pain, Disp: , Rfl:    aspirin 81 MG tablet, Take 81 mg by mouth daily., Disp: , Rfl:    benazepril (LOTENSIN) 20 MG tablet, TAKE 1 TABLET DAILY, Disp: 90 tablet, Rfl: 0   Blood Glucose Monitoring Suppl (ONE TOUCH  ULTRA SYSTEM KIT) W/DEVICE KIT, 1 kit by Does not apply route once., Disp: 1 each, Rfl: 0   Continuous Blood Gluc Receiver (FREESTYLE LIBRE 14 DAY READER) DEVI, 1 Device by Does not apply route See admin instructions. Use for continuous monitor glucose, Disp: 1 each, Rfl: 0   Continuous Blood Gluc Sensor  (FREESTYLE LIBRE 14 DAY SENSOR) MISC, Apply new sensor every 14 days for continuous glucose monitoring., Disp: 6 each, Rfl: 2   estradiol (ESTRACE) 1 MG tablet, Take 1 tablet (1 mg total) by mouth daily., Disp: 90 tablet, Rfl: 1   ezetimibe-simvastatin (VYTORIN) 10-40 MG tablet, TAKE 1 TABLET AT BEDTIME, Disp: 90 tablet, Rfl: 1   fenofibrate 160 MG tablet, TAKE 1 TABLET DAILY, Disp: 90 tablet, Rfl: 1   FLUoxetine (PROZAC) 20 MG tablet, Take 3 tablets (60 mg total) by mouth daily., Disp: 270 tablet, Rfl: 3   glucose blood (ONETOUCH ULTRA) test strip, Use to check blood sugar 3 times a day, Disp: 100 each, Rfl: 0   insulin aspart (NOVOLOG FLEXPEN) 100 UNIT/ML FlexPen, Inject 18-24 Units into the skin 3 (three) times daily with meals., Disp: 45 mL, Rfl: 3   Insulin Glargine (BASAGLAR KWIKPEN) 100 UNIT/ML, Inject 60 Units into the skin at bedtime., Disp: 45 mL, Rfl: 3   Insulin Pen Needle (PEN NEEDLES) 32G X 4 MM MISC, USE AS INSTRUCTED WITH LANCETS., Disp: 400 each, Rfl: 3   meloxicam (MOBIC) 15 MG tablet, Take 1 tablet (15 mg total) by mouth daily., Disp: 30 tablet, Rfl: 0   metFORMIN (GLUCOPHAGE) 850 MG tablet, Take 1 tablet in the AM, and 2 tablets in the PM., Disp: 270 tablet, Rfl: 3   pantoprazole (PROTONIX) 40 MG tablet, TAKE 1 TABLET DAILY, Disp: 90 tablet, Rfl: 3   Semaglutide, 1 MG/DOSE, (OZEMPIC, 1 MG/DOSE,) 4 MG/3ML SOPN, INJECT 1MG SUBCUTANEOUSLY  ONCE A WEEK., Disp: 9 mL, Rfl: 3   Vitamin D, Ergocalciferol, (DRISDOL) 1.25 MG (50000 UNIT) CAPS capsule, Take 1 capsule (50,000 Units total) by mouth every 7 (seven) days., Disp: 12 capsule, Rfl: 0  Current Facility-Administered Medications:    ipratropium-albuterol (DUONEB) 0.5-2.5 (3) MG/3ML nebulizer solution 3 mL, 3 mL, Nebulization, Q6H, Copland, Jessica C, MD, 3 mL at 11/15/16 1330   Objective:     Vitals:   08/25/21 1240  BP: (!) 142/70  Pulse: 78  SpO2: 97%  Weight: 281 lb (127.5 kg)  Height: _0  (1.6 m)      Body mass  index is 49.78 kg/m.    Physical Exam:    General:  awake, alert oriented, no acute distress nontoxic Skin: no suspicious lesions or rashes Neuro:sensation intact, no deficits, strength 5/5 with no deficits, no atrophy, normal muscle tone Psych: No signs of anxiety, depression or other mood disorder  Left knee: Moderate bilateral swelling of both legs No deformity Neg fluid wave, joint milking ROM Flex 80, Ext 10 TTP lateral femoral condyle, lateral joint line, medial joint line NTTP over the quad tendon, medial fem condyle,  patella, plica, patella tendon, tibial tuberostiy, fibular head, posterior fossa, pes anserine bursa, gerdy's tubercle,  Neg anterior and posterior drawer Neg lachman Neg sag sign Negative varus stress Negative valgus stress Negative McMurray  Gait shortened, favoring right leg, using 1 pronged cane   Electronically signed by:  Andrea Marks D.[facility] 1:25 PM 08/25/21

## 2021-08-25 NOTE — Patient Instructions (Signed)
See me again in 2 weeks

## 2021-08-27 ENCOUNTER — Ambulatory Visit: Payer: Medicare Other | Admitting: Internal Medicine

## 2021-09-08 ENCOUNTER — Ambulatory Visit (INDEPENDENT_AMBULATORY_CARE_PROVIDER_SITE_OTHER): Payer: Medicare Other | Admitting: Sports Medicine

## 2021-09-08 ENCOUNTER — Other Ambulatory Visit: Payer: Self-pay

## 2021-09-08 VITALS — BP 150/70 | HR 82 | Ht 63.0 in

## 2021-09-08 DIAGNOSIS — M84452D Pathological fracture, left femur, subsequent encounter for fracture with routine healing: Secondary | ICD-10-CM

## 2021-09-08 DIAGNOSIS — M1712 Unilateral primary osteoarthritis, left knee: Secondary | ICD-10-CM | POA: Diagnosis not present

## 2021-09-08 DIAGNOSIS — M23307 Other meniscus derangements, unspecified meniscus, left knee: Secondary | ICD-10-CM | POA: Diagnosis not present

## 2021-09-08 NOTE — Progress Notes (Signed)
  Andrea Marks D.O. CAQSM Andrea Marks [address] [place] [address] Phone: [phone]   Assessment and Plan:     1. Primary osteoarthritis of left knee 2. 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  Tolerated well per note below - Advised the patient should be nonweightbearing for the next 2 weeks with gradual return to weightbearing in weeks 2-4.  Patient lives alone and is unable to be completely nonweightbearing  Procedure: Knee Joint Injection Side: Left Indication: Osteoarthritis, meniscal tears, subchondral insufficiency fracture  Risks explained and consent was given verbally. The site was cleaned with alcohol prep. A needle was introduced with an anterio-lateral approach. Injection given using 3 mL Durolane. This was well tolerated and resulted in symptomatic relief.  Needle was removed, hemostasis achieved, and post injection instructions were explained.   Pt was advised to call or return to clinic if these symptoms worsen or fail to improve as anticipated.  Pertinent previous records reviewed include none   Follow Up: 2 to 4 weeks for reevaluation.  If no improvement or worsening of symptoms, could consider PRP injection versus referral for nerve ablation and knee versus referral to orthopedic surgery to discuss arthroscopy versus knee replacement   Subjective:   I, Andrea Marks, am serving as a scribe for Dr. Benjamin Marks  Chief Complaint: Left knee and left femur pain follow up   HPI:   08/25/21 Patient is a 70 year old female presenting with bilateral knee pain. Patient's husband unexpectedly died  and she has been very busy. Patient had declined both meloxicam and tramadol offered by Dr. Smith. Today patient states that her pain has increased over past month. Pain in L knee is radiating up and down leg. Right knee is also painful. Patient has had to take care of herself past month since passing of her husband.   09/08/21 Patient states that the knee injection helped for about a week and then the pain went right back to where is was. Pain behind the knee is worse and it radiates down the back of the leg.   Relevant Historical Information: Subchondral femoral fracture seen on MRI, recent passing away of her husband.  DM type II.  Additional pertinent review of systems negative.   Current Outpatient Medications:    acetaminophen (TYLENOL) 650 MG CR tablet, Take 650 mg by mouth 2 (two) times daily as needed for pain. For pain, Disp: , Rfl:    aspirin 81 MG tablet, Take 81 mg by mouth daily., Disp: , Rfl:    benazepril (LOTENSIN) 20 MG tablet, TAKE 1 TABLET DAILY, Disp: 90 tablet, Rfl: 0   Blood Glucose Monitoring Suppl (ONE TOUCH ULTRA SYSTEM KIT) W/DEVICE KIT, 1 kit by Does not apply route once., Disp: 1 each, Rfl: 0   Continuous Blood Gluc Receiver (FREESTYLE LIBRE 14 DAY READER) DEVI, 1 Device by Does not apply route See admin instructions. Use for continuous monitor glucose, Disp: 1 each, Rfl: 0   Continuous Blood Gluc Sensor (FREESTYLE LIBRE 14 DAY SENSOR) MISC, Apply new sensor every 14 days for continuous glucose monitoring., Disp: 6 each, Rfl: 2   estradiol (ESTRACE) 1 MG tablet, Take 1 tablet (1   mg total) by mouth daily., Disp: 90 tablet, Rfl: 1   ezetimibe-simvastatin (VYTORIN) 10-40 MG tablet, TAKE 1 TABLET AT BEDTIME, Disp: 90 tablet, Rfl: 1   fenofibrate 160 MG tablet, TAKE 1 TABLET DAILY, Disp: 90 tablet, Rfl: 1   FLUoxetine (PROZAC) 20 MG tablet, Take 3 tablets (60 mg total) by mouth daily., Disp: 270 tablet, Rfl: 3   glucose blood (ONETOUCH ULTRA) test strip, Use to check  blood sugar 3 times a day, Disp: 100 each, Rfl: 0   insulin aspart (NOVOLOG FLEXPEN) 100 UNIT/ML FlexPen, Inject 18-24 Units into the skin 3 (three) times daily with meals., Disp: 45 mL, Rfl: 3   Insulin Glargine (BASAGLAR KWIKPEN) 100 UNIT/ML, Inject 60 Units into the skin at bedtime., Disp: 45 mL, Rfl: 3   Insulin Pen Needle (PEN NEEDLES) 32G X 4 MM MISC, USE AS INSTRUCTED WITH LANCETS., Disp: 400 each, Rfl: 3   meloxicam (MOBIC) 15 MG tablet, Take 1 tablet (15 mg total) by mouth daily., Disp: 30 tablet, Rfl: 0   metFORMIN (GLUCOPHAGE) 850 MG tablet, Take 1 tablet in the AM, and 2 tablets in the PM., Disp: 270 tablet, Rfl: 3   pantoprazole (PROTONIX) 40 MG tablet, TAKE 1 TABLET DAILY, Disp: 90 tablet, Rfl: 3   Semaglutide, 1 MG/DOSE, (OZEMPIC, 1 MG/DOSE,) 4 MG/3ML SOPN, INJECT 1MG SUBCUTANEOUSLY  ONCE A WEEK., Disp: 9 mL, Rfl: 3   Vitamin D, Ergocalciferol, (DRISDOL) 1.25 MG (50000 UNIT) CAPS capsule, Take 1 capsule (50,000 Units total) by mouth every 7 (seven) days., Disp: 12 capsule, Rfl: 0  Current Facility-Administered Medications:    ipratropium-albuterol (DUONEB) 0.5-2.5 (3) MG/3ML nebulizer solution 3 mL, 3 mL, Nebulization, Q6H, Copland, Jessica C, MD, 3 mL at 11/15/16 1330   Objective:     Vitals:   09/08/21 1256  BP: (!) 150/70  Pulse: 82  SpO2: 96%  Height: 5' 3" (1.6 m)      Body mass index is 49.78 kg/m.    Physical Exam:    Left knee: Moderate bilateral swelling of both legs No deformity Neg fluid wave, joint milking ROM Flex 80, Ext 10 TTP lateral femoral condyle, lateral joint line, medial joint line NTTP over the quad tendon, medial fem condyle,  patella, plica, patella tendon, tibial tuberostiy, fibular head, posterior fossa, pes anserine bursa, gerdy's tubercle,  Neg anterior and posterior drawer Neg lachman Neg sag sign Negative varus stress Negative valgus stress Negative McMurray   Gait shortened, favoring right leg, using 1 pronged  cane   Electronically signed by:  Benito Mccreedy D.[facility] 1:40 PM 09/08/21

## 2021-09-08 NOTE — Patient Instructions (Addendum)
Good to see you  Knee gel injection given in Left knee today Stay off the left knee as much as possible  Follow up in 2-4 weeks for re-evaluation

## 2021-09-11 ENCOUNTER — Telehealth: Payer: Self-pay

## 2021-09-11 NOTE — Telephone Encounter (Signed)
Inbound fax requesting forms be completed and faxed. Forms completed and faxed to [place] at [phone].

## 2021-09-22 ENCOUNTER — Ambulatory Visit: Payer: Medicare Other | Admitting: Sports Medicine

## 2021-09-28 ENCOUNTER — Ambulatory Visit: Payer: Medicare Other | Admitting: Sports Medicine

## 2021-09-28 NOTE — Progress Notes (Deleted)
Benito Mccreedy D.[facility] Phone: [phone]   Assessment and Plan:     There are no diagnoses linked to this encounter.  ***   Pertinent previous records reviewed include ***   Follow Up: ***     Subjective:   I, Andrea Marks, am serving as a scribe for Dr. Glennon Mac  Chief Complaint: Left knee and left femur pain follow up   HPI:   08/25/21 Patient is a 70 year old female presenting with bilateral knee pain. Patient's husband unexpectedly died and she has been very busy. Patient had declined both meloxicam and tramadol offered by Dr. Tamala Julian. Today patient states that her pain has increased over past month. Pain in L knee is radiating up and down leg. Right knee is also painful. Patient has had to take care of herself past month since passing of her husband.    09/08/21 Patient states that the knee injection helped for about a week and then the pain went right back to where is was. Pain behind the knee is worse and it radiates down the back of the leg.     09/28/21 Patient states   Relevant Historical Information: Subchondral femoral fracture seen on MRI, recent passing away of her husband.  DM type II.  Additional pertinent review of systems negative.   Current Outpatient Medications:    acetaminophen (TYLENOL) 650 MG CR tablet, Take 650 mg by mouth 2 (two) times daily as needed for pain. For pain, Disp: , Rfl:    aspirin 81 MG tablet, Take 81 mg by mouth daily., Disp: , Rfl:    benazepril (LOTENSIN) 20 MG tablet, TAKE 1 TABLET DAILY, Disp: 90 tablet, Rfl: 0   Blood Glucose Monitoring Suppl (ONE TOUCH ULTRA SYSTEM KIT) W/DEVICE KIT, 1 kit by Does not apply route once., Disp: 1 each, Rfl: 0   Continuous Blood Gluc Receiver (FREESTYLE LIBRE 14 DAY READER) DEVI, 1 Device by Does not apply route See admin instructions. Use for continuous monitor glucose, Disp: 1 each, Rfl: 0   Continuous  Blood Gluc Sensor (FREESTYLE LIBRE 14 DAY SENSOR) MISC, Apply new sensor every 14 days for continuous glucose monitoring., Disp: 6 each, Rfl: 2   estradiol (ESTRACE) 1 MG tablet, Take 1 tablet (1 mg total) by mouth daily., Disp: 90 tablet, Rfl: 1   ezetimibe-simvastatin (VYTORIN) 10-40 MG tablet, TAKE 1 TABLET AT BEDTIME, Disp: 90 tablet, Rfl: 1   fenofibrate 160 MG tablet, TAKE 1 TABLET DAILY, Disp: 90 tablet, Rfl: 1   FLUoxetine (PROZAC) 20 MG tablet, Take 3 tablets (60 mg total) by mouth daily., Disp: 270 tablet, Rfl: 3   glucose blood (ONETOUCH ULTRA) test strip, Use to check blood sugar 3 times a day, Disp: 100 each, Rfl: 0   insulin aspart (NOVOLOG FLEXPEN) 100 UNIT/ML FlexPen, Inject 18-24 Units into the skin 3 (three) times daily with meals., Disp: 45 mL, Rfl: 3   Insulin Glargine (BASAGLAR KWIKPEN) 100 UNIT/ML, Inject 60 Units into the skin at bedtime., Disp: 45 mL, Rfl: 3   Insulin Pen Needle (PEN NEEDLES) 32G X 4 MM MISC, USE AS INSTRUCTED WITH LANCETS., Disp: 400 each, Rfl: 3   meloxicam (MOBIC) 15 MG tablet, Take 1 tablet (15 mg total) by mouth daily., Disp: 30 tablet, Rfl: 0   metFORMIN (GLUCOPHAGE) 850 MG tablet, Take 1 tablet in the AM, and 2 tablets in the PM., Disp: 270 tablet, Rfl: 3   pantoprazole (  PROTONIX) 40 MG tablet, TAKE 1 TABLET DAILY, Disp: 90 tablet, Rfl: 3   Semaglutide, 1 MG/DOSE, (OZEMPIC, 1 MG/DOSE,) 4 MG/3ML SOPN, INJECT 1MG SUBCUTANEOUSLY  ONCE A WEEK., Disp: 9 mL, Rfl: 3   Vitamin D, Ergocalciferol, (DRISDOL) 1.25 MG (50000 UNIT) CAPS capsule, Take 1 capsule (50,000 Units total) by mouth every 7 (seven) days., Disp: 12 capsule, Rfl: 0  Current Facility-Administered Medications:    ipratropium-albuterol (DUONEB) 0.5-2.5 (3) MG/3ML nebulizer solution 3 mL, 3 mL, Nebulization, Q6H, Copland, Jessica C, MD, 3 mL at 11/15/16 1330   Objective:     There were no vitals filed for this visit.    There is no height or weight on file to calculate BMI.    Physical Exam:     ***   Electronically signed by:  Benito Mccreedy D.[facility] 9:30 AM 09/28/21

## 2021-09-29 ENCOUNTER — Ambulatory Visit: Payer: Medicare Other | Admitting: Family Medicine

## 2021-09-29 ENCOUNTER — Ambulatory Visit (INDEPENDENT_AMBULATORY_CARE_PROVIDER_SITE_OTHER): Payer: Medicare Other | Admitting: Family Medicine

## 2021-09-29 ENCOUNTER — Encounter: Payer: Self-pay | Admitting: Family Medicine

## 2021-09-29 ENCOUNTER — Other Ambulatory Visit: Payer: Self-pay

## 2021-09-29 VITALS — BP 132/64 | HR 93 | Temp 98.6°F | Resp 20 | Ht 63.0 in | Wt 268.8 lb

## 2021-09-29 DIAGNOSIS — G4700 Insomnia, unspecified: Secondary | ICD-10-CM | POA: Diagnosis not present

## 2021-09-29 DIAGNOSIS — F418 Other specified anxiety disorders: Secondary | ICD-10-CM

## 2021-09-29 DIAGNOSIS — M159 Polyosteoarthritis, unspecified: Secondary | ICD-10-CM | POA: Diagnosis not present

## 2021-09-29 MED ORDER — CLONAZEPAM 0.5 MG PO TABS: ORAL_TABLET | ORAL | 1 refills | Status: DC

## 2021-09-29 NOTE — Patient Instructions (Signed)
Managing Loss, Adult °People experience loss in many different ways throughout their lives. Events such as moving, changing jobs, and losing friends can create a sense of loss. The loss may be as serious as a major health change, divorce, death of a pet, or death of a loved one. All of these types of loss are likely to create a physical and emotional reaction known as grief. Grief is the result of a major change or an absence of something or someone that you count on. Grief is a normal reaction to loss. °A variety of factors can affect your grieving experience, including: °The nature of your loss. °Your relationship to what or whom you lost. °Your understanding of grief and how to manage it. °Your support system. °Be aware that when grief becomes extreme, it can lead to more severe issues like isolation, depression, anxiety, or suicidal thoughts. Talk with your health care provider if you have any of these issues. °How to manage lifestyle changes °Keep to your normal routine as much as possible. °If you have trouble focusing or doing normal activities, it is acceptable to take some time away from your normal routine. °Spend time with friends and loved ones. °Eat a healthy diet, get plenty of sleep, and rest when you feel tired. °How to recognize changes  °The way that you deal with your grief will affect your ability to function as you normally do. When grieving, you may experience these changes: °Numbness, shock, sadness, anxiety, anger, denial, and guilt. °Thoughts about death. °Unexpected crying. °A physical sensation of emptiness in your stomach. °Problems sleeping and eating. °Tiredness (fatigue). °Loss of interest in normal activities. °Dreaming about or imagining seeing the person who died. °A need to remember what or whom you lost. °Difficulty thinking about anything other than your loss for a period of time. °Relief. If you have been expecting the loss for a while, you may feel a sense of relief when it  happens. °Follow these instructions at home: °Activity °Express your feelings in healthy ways, such as: °Talking with others about your loss. It may be helpful to find others who have had a similar loss, such as a support group. °Writing down your feelings in a journal. °Doing physical activities to release stress and emotional energy. °Doing creative activities like painting, sculpting, or playing or listening to music. °Practicing resilience. This is the ability to recover and adjust after facing challenges. Reading some resources that encourage resilience may help you to learn ways to practice those behaviors. ° °General instructions °Be patient with yourself and others. Allow the grieving process to happen, and remember that grieving takes time. °It is likely that you may never feel completely done with some grief. You may find a way to move on while still cherishing memories and feelings about your loss. °Accepting your loss is a process. It can take months or longer to adjust. °Keep all follow-up visits. This is important. °Where to find support °To get support for managing loss: °Ask your health care provider for help and recommendations, such as grief counseling or therapy. °Think about joining a support group for people who are managing a loss. °Where to find more information °You can find more information about managing loss from: °American Society of Clinical Oncology: www.cancer.net °American Psychological Association: www.apa.org °Contact a health care provider if: °Your grief is extreme and keeps getting worse. °You have ongoing grief that does not improve. °Your body shows symptoms of grief, such as illness. °You feel depressed, anxious, or   hopeless. °Get help right away if: °You have thoughts about hurting yourself or others. °Get help right away if you feel like you may hurt yourself or others, or have thoughts about taking your own life. Go to your nearest emergency room or: °Call 911. °Call the  National Suicide Prevention Lifeline at 1-800-273-8255 or 988. This is open 24 hours a day. °Text the Crisis Text Line at 741741. °Summary °Grief is the result of a major change or an absence of someone or something that you count on. Grief is a normal reaction to loss. °The depth of grief and the period of recovery depend on the type of loss and your ability to adjust to the change and process your feelings. °Processing grief requires patience and a willingness to accept your feelings and talk about your loss with people who are supportive. °It is important to find resources that work for you and to realize that people experience grief differently. There is not one grieving process that works for everyone in the same way. °Be aware that when grief becomes extreme, it can lead to more severe issues like isolation, depression, anxiety, or suicidal thoughts. Talk with your health care provider if you have any of these issues. °This information is not intended to replace advice given to you by your health care provider. Make sure you discuss any questions you have with your health care provider. °Document Revised: 06/08/2021 Document Reviewed: 06/08/2021 °Elsevier Patient Education © 2022 Elsevier Inc. ° °

## 2021-09-29 NOTE — Progress Notes (Signed)
Established Patient Office Visit  Subjective:  Patient ID: Andrea Marks, female    DOB: [birth year]  Age: 70 y.o. MRN: [id number]  CC:  Chief Complaint  Patient presents with   Anxiety   Follow-up    HPI Andrea Marks presents for anxiety ---- her husband passed away in 09-16-23 ---  massive MI.  She has not been sleeping well.  She has no other family here and can not afford to go back up [place].    Past Medical History:  Diagnosis Date   Depression    Diabetes mellitus    GERD (gastroesophageal reflux disease)    Hyperlipidemia    Hypertension    Obesity     Past Surgical History:  Procedure Laterality Date   ABDOMINAL HYSTERECTOMY  1991   BSO    Family History  Problem Relation Age of Onset   Macular degeneration Mother    Heart disease Mother        syncope   Aortic stenosis Mother    Lung disease Father 38       mesothelioma   Cancer Maternal Aunt        stomach   Heart disease Paternal Uncle        cabg   Diabetes Paternal Uncle    Heart disease Paternal Uncle    Diabetes Paternal Uncle    Diabetes Paternal Uncle    Heart disease Paternal Uncle    Heart disease Paternal Uncle    Heart disease Paternal Uncle    Lung cancer Other        asbestos   Diabetes Paternal Grandmother    Cancer Other        lung    Social History   Socioeconomic History   Marital status: Widowed    Spouse name: Not on file   Number of children: 0   Years of education: 49   Highest education level: Not on file  Occupational History   Occupation: retired from [employer]or, [employer]: RETIRED  Tobacco Use   Smoking status: Former    Types: Cigarettes    Quit date: 1995    Years since quitting: 27.9   Smokeless tobacco: Never  Substance and Sexual Activity   Alcohol use: No   Drug use: No   Sexual activity: Yes    Partners: Male  Other Topics Concern   Not on file  Social History Narrative   Lives with husband   Right Handed   Drinks  3-5 cups caffeine daily   Social Determinants of Health   Financial Resource Strain: Low Risk    Difficulty of Paying Living Expenses: Not hard at all  Food Insecurity: No Food Insecurity   Worried About Charity fundraiser in the Last Year: Never true   Arboriculturist in the Last Year: Never true  Transportation Needs: No Transportation Needs   Lack of Transportation (Medical): No   Lack of Transportation (Non-Medical): No  Physical Activity: Inactive   Days of Exercise per Week: 0 days   Minutes of Exercise per Session: 0 min  Stress: No Stress Concern Present   Feeling of Stress : Not at all  Social Connections: Moderately Isolated   Frequency of Communication with Friends and Family: More than three times a week   Frequency of Social Gatherings with Friends and Family: Once a week   Attends Religious Services: Never   Marine scientist or Organizations: No  Attends Archivist Meetings: Never   Marital Status: Married  Human resources officer Violence: Not At Risk   Fear of Current or Ex-Partner: No   Emotionally Abused: No   Physically Abused: No   Sexually Abused: No    Outpatient Medications Prior to Visit  Medication Sig Dispense Refill   acetaminophen (TYLENOL) 650 MG CR tablet Take 650 mg by mouth 2 (two) times daily as needed for pain. For pain     aspirin 81 MG tablet Take 81 mg by mouth daily.     benazepril (LOTENSIN) 20 MG tablet TAKE 1 TABLET DAILY 90 tablet 0   Blood Glucose Monitoring Suppl (ONE TOUCH ULTRA SYSTEM KIT) W/DEVICE KIT 1 kit by Does not apply route once. 1 each 0   Continuous Blood Gluc Receiver (FREESTYLE LIBRE 14 DAY READER) DEVI 1 Device by Does not apply route See admin instructions. Use for continuous monitor glucose 1 each 0   Continuous Blood Gluc Sensor (FREESTYLE LIBRE 14 DAY SENSOR) MISC Apply new sensor every 14 days for continuous glucose monitoring. 6 each 2   ezetimibe-simvastatin (VYTORIN) 10-40 MG tablet TAKE 1 TABLET AT  BEDTIME 90 tablet 1   fenofibrate 160 MG tablet TAKE 1 TABLET DAILY 90 tablet 1   FLUoxetine (PROZAC) 20 MG tablet Take 3 tablets (60 mg total) by mouth daily. 270 tablet 3   glucose blood (ONETOUCH ULTRA) test strip Use to check blood sugar 3 times a day 100 each 0   insulin aspart (NOVOLOG FLEXPEN) 100 UNIT/ML FlexPen Inject 18-24 Units into the skin 3 (three) times daily with meals. 45 mL 3   Insulin Pen Needle (PEN NEEDLES) 32G X 4 MM MISC USE AS INSTRUCTED WITH LANCETS. 400 each 3   meloxicam (MOBIC) 15 MG tablet Take 1 tablet (15 mg total) by mouth daily. 30 tablet 0   metFORMIN (GLUCOPHAGE) 850 MG tablet Take 1 tablet in the AM, and 2 tablets in the PM. 270 tablet 3   pantoprazole (PROTONIX) 40 MG tablet TAKE 1 TABLET DAILY 90 tablet 3   Semaglutide, 1 MG/DOSE, (OZEMPIC, 1 MG/DOSE,) 4 MG/3ML SOPN INJECT 1MG SUBCUTANEOUSLY  ONCE A WEEK. 9 mL 3   Vitamin D, Ergocalciferol, (DRISDOL) 1.25 MG (50000 UNIT) CAPS capsule Take 1 capsule (50,000 Units total) by mouth every 7 (seven) days. 12 capsule 0   estradiol (ESTRACE) 1 MG tablet Take 1 tablet (1 mg total) by mouth daily. 90 tablet 1   Insulin Glargine (BASAGLAR KWIKPEN) 100 UNIT/ML Inject 60 Units into the skin at bedtime. 45 mL 3   Facility-Administered Medications Prior to Visit  Medication Dose Route Frequency Provider Last Rate Last Admin   ipratropium-albuterol (DUONEB) 0.5-2.5 (3) MG/3ML nebulizer solution 3 mL  3 mL Nebulization Q6H Copland, Jessica C, MD   3 mL at 11/15/16 1330    Allergies  Allergen Reactions   Hydrocodone-Acetaminophen Other (See Comments)    unknown   Oxycodone Hcl Other (See Comments)    unknown   Penicillins Other (See Comments)    unknown   Prednisone     Patient has noted allergy to prednisone but she says it was a questionable history of mild nausea with prednisone and nothing more     ROS Review of Systems  Constitutional:  Negative for appetite change, diaphoresis, fatigue and unexpected weight  change.  Eyes:  Negative for pain, redness and visual disturbance.  Respiratory:  Negative for cough, chest tightness, shortness of breath and wheezing.   Cardiovascular:  Negative  for chest pain, palpitations and leg swelling.  Endocrine: Negative for cold intolerance, heat intolerance, polydipsia, polyphagia and polyuria.  Genitourinary:  Negative for difficulty urinating, dysuria and frequency.  Neurological:  Negative for dizziness, light-headedness, numbness and headaches.  Psychiatric/Behavioral:  Positive for sleep disturbance. Negative for decreased concentration, self-injury and suicidal ideas. The patient is nervous/anxious.      Objective:    Physical Exam Vitals and nursing note reviewed.  Constitutional:      Appearance: She is well-developed.  HENT:     Head: Normocephalic and atraumatic.  Eyes:     Conjunctiva/sclera: Conjunctivae normal.  Neck:     Thyroid: No thyromegaly.     Vascular: No carotid bruit or JVD.  Cardiovascular:     Rate and Rhythm: Normal rate and regular rhythm.     Heart sounds: Normal heart sounds. No murmur heard. Pulmonary:     Effort: Pulmonary effort is normal. No respiratory distress.     Breath sounds: Normal breath sounds. No wheezing or rales.  Chest:     Chest wall: No tenderness.  Musculoskeletal:     Cervical back: Normal range of motion and neck supple.  Neurological:     Mental Status: She is alert and oriented to person, place, and time.  Psychiatric:        Mood and Affect: Mood is anxious and depressed. Affect is tearful.        Speech: Speech normal.        Behavior: Behavior normal.        Thought Content: Thought content normal.        Cognition and Memory: Memory normal.        Judgment: Judgment normal.    BP 132/64 (BP Location: Left Arm, Patient Position: Sitting, Cuff Size: Large)   Pulse 93   Temp 98.6 F (37 C) (Oral)   Resp 20   Ht 5' 3" (1.6 m)   Wt 268 lb 12.8 oz (121.9 kg)   SpO2 96%   BMI 47.62  kg/m  Wt Readings from Last 3 Encounters:  09/29/21 268 lb 12.8 oz (121.9 kg)  08/25/21 281 lb (127.5 kg)  06/18/21 282 lb (127.9 kg)     Health Maintenance Due  Topic Date Due   COLONOSCOPY (Pts 45-61yr Insurance coverage will need to be confirmed)  02/16/2021   COVID-19 Vaccine (5 - Booster for Pfizer series) 05/14/2021   TETANUS/TDAP  06/07/2021   HEMOGLOBIN A1C  08/28/2021    There are no preventive care reminders to display for this patient.  Lab Results  Component Value Date   TSH 3.22 08/07/2015   Lab Results  Component Value Date   WBC 9.9 03/19/2021   HGB 14.0 03/19/2021   HCT 41.9 03/19/2021   MCV 83.2 03/19/2021   PLT 172.0 03/19/2021   Lab Results  Component Value Date   NA 140 03/19/2021   K 4.7 03/19/2021   CO2 30 03/19/2021   GLUCOSE 138 (H) 03/19/2021   BUN 16 03/19/2021   CREATININE 0.79 03/19/2021   BILITOT 0.4 01/01/2021   ALKPHOS 26 (L) 01/01/2021   AST 13 01/01/2021   ALT 12 01/01/2021   PROT 6.0 01/01/2021   ALBUMIN 3.7 01/01/2021   CALCIUM 9.2 03/19/2021   ANIONGAP 8 03/04/2021   GFR 75.97 03/19/2021   Lab Results  Component Value Date   CHOL 98 01/01/2021   Lab Results  Component Value Date   HDL 48.70 01/01/2021   Lab Results  Component Value  Date   LDLCALC 20 01/01/2021   Lab Results  Component Value Date   TRIG 147.0 01/01/2021   Lab Results  Component Value Date   CHOLHDL 2 01/01/2021   Lab Results  Component Value Date   HGBA1C 5.9 (H) 02/26/2021      Assessment & Plan:   Problem List Items Addressed This Visit       Unprioritized   Depression with anxiety    con't prozac and add klonopin for anxiety and sleep Consider counselor  rto 1 month      Relevant Orders   AMB Referral to Community Care Coordinaton   Insomnia - Primary    Klonopin prn qhs       Relevant Medications   clonazePAM (KLONOPIN) 0.5 MG tablet   Other Visit Diagnoses     Primary osteoarthritis involving multiple joints            Meds ordered this encounter  Medications   clonazePAM (KLONOPIN) 0.5 MG tablet    Sig: 1/2-1 po qhs prn    Dispense:  20 tablet    Refill:  1    Follow-up: Return in about 4 weeks (around 10/27/2021), or if symptoms worsen or fail to improve, for anxiety.    [name], DO

## 2021-09-30 ENCOUNTER — Telehealth: Payer: Self-pay | Admitting: *Deleted

## 2021-09-30 NOTE — Chronic Care Management (AMB) (Signed)
  Chronic Care Management   Note  09/30/2021 Name: Saryna Kneeland MRN: [id number] DOB: Feb 02, 1951  Aariana Shankland Delage is a 70 y.o. year old female who is a primary care patient of [name], DO. I reached out to [employer] by phone today in response to a referral sent by Ms. Liam Rogers Fabiano's PCP.  Ms. Putman was given information about Chronic Care Management services today including:  CCM service includes personalized support from designated clinical staff supervised by her physician, including individualized plan of care and coordination with other care providers 24/7 contact phone numbers for assistance for urgent and routine care needs. Service will only be billed when office clinical staff spend 20 minutes or more in a month to coordinate care. Only one practitioner may furnish and bill the service in a calendar month. The patient may stop CCM services at any time (effective at the end of the month) by phone call to the office staff. The patient is responsible for co-pay (up to 20% after annual deductible is met) if co-pay is required by the individual health plan.   Patient agreed to services and verbal consent obtained.   Follow up plan: Telephone appointment with care management team member scheduled for: 10/09/2021  Julian Hy, [facility]  Direct Dial: [phone]

## 2021-10-01 ENCOUNTER — Other Ambulatory Visit: Payer: Self-pay | Admitting: Family Medicine

## 2021-10-01 ENCOUNTER — Other Ambulatory Visit: Payer: Self-pay | Admitting: Internal Medicine

## 2021-10-01 DIAGNOSIS — Z780 Asymptomatic menopausal state: Secondary | ICD-10-CM

## 2021-10-01 DIAGNOSIS — G4700 Insomnia, unspecified: Secondary | ICD-10-CM | POA: Insufficient documentation

## 2021-10-01 DIAGNOSIS — F418 Other specified anxiety disorders: Secondary | ICD-10-CM | POA: Insufficient documentation

## 2021-10-01 NOTE — Assessment & Plan Note (Signed)
con't prozac and add klonopin for anxiety and sleep Consider counselor  rto 1 month

## 2021-10-01 NOTE — Assessment & Plan Note (Signed)
Klonopin prn qhs

## 2021-10-02 ENCOUNTER — Ambulatory Visit (INDEPENDENT_AMBULATORY_CARE_PROVIDER_SITE_OTHER): Payer: Medicare Other | Admitting: Podiatry

## 2021-10-02 ENCOUNTER — Other Ambulatory Visit: Payer: Self-pay

## 2021-10-02 ENCOUNTER — Encounter: Payer: Self-pay | Admitting: Podiatry

## 2021-10-02 DIAGNOSIS — Z794 Long term (current) use of insulin: Secondary | ICD-10-CM | POA: Diagnosis not present

## 2021-10-02 DIAGNOSIS — M79676 Pain in unspecified toe(s): Secondary | ICD-10-CM | POA: Diagnosis not present

## 2021-10-02 DIAGNOSIS — B351 Tinea unguium: Secondary | ICD-10-CM

## 2021-10-02 DIAGNOSIS — E1151 Type 2 diabetes mellitus with diabetic peripheral angiopathy without gangrene: Secondary | ICD-10-CM | POA: Diagnosis not present

## 2021-10-02 NOTE — Progress Notes (Signed)
This patient returns to my office for at risk foot care.  This patient requires this care by a professional since this patient will be at risk due to having diabetes and history of thrombophlebitis.  This patient is unable to cut nails herself since the patient cannot reach her nails.These nails are painful walking and wearing shoes.  This patient presents for at risk foot care today.  General Appearance  Alert, conversant and in no acute stress.  Vascular  Dorsalis pedis and posterior tibial  pulses are not  palpable  Bilaterally  Due to swelling..  Capillary return is within normal limits  bilaterally. Temperature is within normal limits  bilaterally.  Neurologic  Senn-Weinstein monofilament wire test diminished  bilaterally. Muscle power within normal limits bilaterally.  Nails Thick disfigured discolored nails with subungual debris  from hallux to fifth toes bilaterally. No evidence of bacterial infection or drainage bilaterally.  Orthopedic  No limitations of motion  feet .  No crepitus or effusions noted.  No bony pathology or digital deformities noted.  Skin  normotropic skin with no porokeratosis noted bilaterally.  No signs of infections or ulcers noted.   Pinch callus  B/L.   No infection or drainage noted.  Onychomycosis  Pain in right toes  Pain in left toes    Consent was obtained for treatment procedures.   Mechanical debridement of nails 1-5  bilaterally performed with a nail nipper.  Filed with dremel without incident.    Return office visit   9  weeks                  Told patient to return for periodic foot care and evaluation due to potential at risk complications.   Helane Gunther DPM

## 2021-10-06 ENCOUNTER — Telehealth: Payer: Self-pay | Admitting: *Deleted

## 2021-10-06 ENCOUNTER — Encounter: Payer: Self-pay | Admitting: *Deleted

## 2021-10-06 NOTE — Chronic Care Management (AMB) (Signed)
  Care Management   Note  10/06/2021 Name: Andrea Marks MRN: [id number] DOB: December 14, 1950  Andrea Marks is a 70 y.o. year old female who is a primary care patient of Donato Schultz, DO and is actively engaged with the care management team. I reached out to Tera Mater Bensen by phone today to assist with re-scheduling an initial visit with the Licensed Clinical Social Worker  Follow up plan: Telephone appointment with care management team member scheduled for:10/13/21  Gwenevere Ghazi  Care Guide, Embedded Care Coordination [facility]  Care Management  Direct Dial: [phone]

## 2021-10-06 NOTE — Chronic Care Management (AMB) (Signed)
  Care Management   Note  10/06/2021 Name: Andrea Marks MRN: [id number] DOB: 22-Sep-1951  Andrea Marks is a 70 y.o. year old female who is a primary care patient of Donato Schultz, DO and is actively engaged with the care management team. I reached out to Andrea Marks by phone today to assist with re-scheduling an initial visit with the Licensed Clinical Social Worker  Follow up plan: Unsuccessful telephone outreach attempt made. A HIPAA compliant phone message was left for the patient providing contact information and requesting a return call.  The care management team will reach out to the patient again over the next 7 days.  If patient returns call to provider office, please advise to call Embedded Care Management Care Guide Andrea Marks at [phone].  Andrea Marks  Care Guide, Embedded Care Coordination [facility] Management  Direct Dial: [phone]

## 2021-10-06 NOTE — Progress Notes (Signed)
error 

## 2021-10-09 ENCOUNTER — Telehealth: Payer: Medicare Other

## 2021-10-13 ENCOUNTER — Ambulatory Visit: Payer: Medicare Other | Admitting: *Deleted

## 2021-10-13 DIAGNOSIS — E785 Hyperlipidemia, unspecified: Secondary | ICD-10-CM

## 2021-10-13 DIAGNOSIS — E1169 Type 2 diabetes mellitus with other specified complication: Secondary | ICD-10-CM

## 2021-10-13 DIAGNOSIS — M1711 Unilateral primary osteoarthritis, right knee: Secondary | ICD-10-CM

## 2021-10-13 DIAGNOSIS — I10 Essential (primary) hypertension: Secondary | ICD-10-CM

## 2021-10-13 DIAGNOSIS — M159 Polyosteoarthritis, unspecified: Secondary | ICD-10-CM

## 2021-10-13 DIAGNOSIS — H5461 Unqualified visual loss, right eye, normal vision left eye: Secondary | ICD-10-CM

## 2021-10-13 DIAGNOSIS — F321 Major depressive disorder, single episode, moderate: Secondary | ICD-10-CM

## 2021-10-13 DIAGNOSIS — F418 Other specified anxiety disorders: Secondary | ICD-10-CM

## 2021-10-13 DIAGNOSIS — E1165 Type 2 diabetes mellitus with hyperglycemia: Secondary | ICD-10-CM

## 2021-10-13 NOTE — Chronic Care Management (AMB) (Signed)
Chronic Care Management    Clinical Social Work Note  10/13/2021 Name: Andrea Marks MRN: [id number] DOB: 05-03-1951  Andrea Marks is a 70 y.o. year old female who is a primary care patient of [name], DO. The CCM team was consulted to assist the patient with chronic disease management and/or care coordination needs related to: [employer], Mental Health Counseling and Resources, and Grief Counseling.   Engaged with patient by telephone for initial visit in response to provider referral for social work chronic care management and care coordination services.   Consent to Services:  The patient was given information about Chronic Care Management services, agreed to services, and gave verbal consent prior to initiation of services.  Please see initial visit note for detailed documentation.   Patient agreed to services and consent obtained.   Assessment: Review of patient past medical history, allergies, medications, and health status, including review of relevant consultants reports was performed today as part of a comprehensive evaluation and provision of chronic care management and care coordination services.     SDOH (Social Determinants of Health) assessments and interventions performed:  SDOH Interventions    Flowsheet Row Most Recent Value  SDOH Interventions   Food Insecurity Interventions Intervention Not Indicated  Financial Strain Interventions Intervention Not Indicated  Housing Interventions Intervention Not Indicated  Intimate Partner Violence Interventions Intervention Not Indicated  Physical Activity Interventions Patient Refused  Stress Interventions Offered [employer] Resources, Provide Counseling  Social Connections Interventions Intervention Not Indicated, Patient Refused  Transportation Interventions Intervention Not Indicated  Depression Interventions/Treatment  Counseling        Advanced Directives Status:  See Care Plan for related entries.  CCM Care Plan  Allergies  Allergen Reactions   Hydrocodone-Acetaminophen Other (See Comments)    unknown   Oxycodone Hcl Other (See Comments)    unknown   Penicillins Other (See Comments)    unknown   Prednisone     Patient has noted allergy to prednisone but she says it was a questionable history of mild nausea with prednisone and nothing more     Outpatient Encounter Medications as of 10/13/2021  Medication Sig   acetaminophen (TYLENOL) 650 MG CR tablet Take 650 mg by mouth 2 (two) times daily as needed for pain. For pain   aspirin 81 MG tablet Take 81 mg by mouth daily.   benazepril (LOTENSIN) 20 MG tablet TAKE 1 TABLET DAILY   Blood Glucose Monitoring Suppl (ONE TOUCH ULTRA SYSTEM KIT) W/DEVICE KIT 1 kit by Does not apply route once.   clonazePAM (KLONOPIN) 0.5 MG tablet 1/2-1 po qhs prn   Continuous Blood Gluc Receiver (FREESTYLE LIBRE 14 DAY READER) DEVI 1 Device by Does not apply route See admin instructions. Use for continuous monitor glucose   Continuous Blood Gluc Sensor (FREESTYLE LIBRE 14 DAY SENSOR) MISC Apply new sensor every 14 days for continuous glucose monitoring.   estradiol (ESTRACE) 1 MG tablet TAKE 1 TABLET DAILY   ezetimibe-simvastatin (VYTORIN) 10-40 MG tablet TAKE 1 TABLET AT BEDTIME   fenofibrate 160 MG tablet TAKE 1 TABLET DAILY   FLUoxetine (PROZAC) 20 MG tablet Take 3 tablets (60 mg total) by mouth daily.   glucose blood (ONETOUCH ULTRA) test strip Use to check blood sugar 3 times a day   insulin aspart (NOVOLOG FLEXPEN) 100 UNIT/ML FlexPen Inject 18-24 Units into the skin 3 (three) times daily with meals.   Insulin Glargine (BASAGLAR KWIKPEN) 100 UNIT/ML INJECT 60 UNITS  SUBCUTANEOUSLY AT BEDTIME   Insulin Pen Needle (PEN NEEDLES) 32G X 4 MM MISC USE AS INSTRUCTED WITH LANCETS.   meloxicam (MOBIC) 15 MG tablet Take 1 tablet (15 mg total) by mouth daily.   metFORMIN (GLUCOPHAGE) 850 MG tablet Take 1 tablet  in the AM, and 2 tablets in the PM.   pantoprazole (PROTONIX) 40 MG tablet TAKE 1 TABLET DAILY   Semaglutide, 1 MG/DOSE, (OZEMPIC, 1 MG/DOSE,) 4 MG/3ML SOPN INJECT $RemoveBef'1MG'[id number]$  SUBCUTANEOUSLY  ONCE A WEEK.   Vitamin D, Ergocalciferol, (DRISDOL) 1.25 MG (50000 UNIT) CAPS capsule Take 1 capsule (50,000 Units total) by mouth every 7 (seven) days.   Facility-Administered Encounter Medications as of 10/13/2021  Medication   ipratropium-albuterol (DUONEB) 0.5-2.5 (3) MG/3ML nebulizer solution 3 mL    Patient Active Problem List   Diagnosis Date Noted   Depression with anxiety 10/01/2021   Insomnia 10/01/2021   Vision loss of right eye    Nonintractable headache    Papilledema 02/26/2021   Degenerative arthritis of knee, bilateral 10/30/2020   Degenerative arthritis of right knee 12/25/2018   Fatigue 12/15/2018   Hyperlipidemia associated with type 2 diabetes mellitus ([name]) 12/15/2018   B12 deficiency 12/15/2018   AC (acromioclavicular) joint arthritis 06/20/2018   Left rotator cuff tear 05/29/2018   Hyperlipidemia LDL goal <70 12/19/2017   Morbid obesity ([name]) 11/15/2016   Type 2 diabetes mellitus with hyperglycemia, with long-term current use of insulin ([name]) 10/23/2015   Post-traumatic wound infection 04/17/2013   Thrombophlebitis leg 02/23/2011   TINEA CORPORIS 12/30/2008   Depression, major, single episode, moderate ([name]) 12/14/2006   Essential hypertension 12/14/2006   GERD 12/14/2006    Conditions to be addressed/monitored: Anxiety, Depression, and Grief.  Limited Social Support, Mental Health Concerns, Social Isolation, Limited Access to Caregiver, and Lacks Knowledge of [employer].  Care Plan : LCSW Plan of Care  Updates made by Francis Gaines, LCSW since 10/13/2021 12:00 AM     Problem: Reduce and Manage My Symptoms of Depression.   Priority: High     Long-Range Goal: Reduce and Manage My Symptoms of Depression.   Start Date: 10/13/2021  Expected End Date:  02/11/2022  This Visit's Progress: On track  Priority: High  Note:   Current Barriers:   Acute Mental Health Needs related to Degenerative Arthritis of Bilateral Knees, Right Eye Vision Loss, Morbid Obesity, Major Depression, Anxiety and Grief, requires Support, Education, Resources, Referrals, Advocacy and Care Coordination in order to meet unmet mental health needs. Clinical Goal(s):  Patient will work with LCSW to reduce and manage symptoms of Depression, Anxiety and Grief, until well-controlled.     Patient will increase knowledge and/or ability of:        Coping Skills, Healthy Habits, Self-Management Skills, Stress Reduction, Home Safety and Utilizing [employer] and Resources.   Clinical Interventions:  Assessed patient's previous treatment, needs, coping skills, current treatment, support system and barriers to care. PHQ-2 and PHQ-9 Depression Screening Tool performed and results reviewed with patient. Mindfulness Meditation Strategies, Relaxation Techniques and Deep Breathing Exercises taught and encouraged, daily. Solution-Focused Therapy performed, Verbalization of Feelings encouraged, Emotional Support provided, Problem Solving Solutions developed, Brief Cognitive Behavioral Therapy initiated, Quality of Sleep assessed and Sleep Hygiene Techniques promoted, Grief and Loss Support Group Participation encouraged, Increase Level of Activity/Exercise emphasized.    Discussed plans with patient for ongoing care management follow-up and provided patient with direct contact information for care management team. Discussed several options for long-term counseling based on need and  insurance, but patient denies the need for long-term counseling, nor is she interested in being referred to [facility], or any other community provider for counseling and supportive services.   Collaboration with Primary Care Physician, Dr. Roma Schanz regarding development and update of comprehensive  plan of care as evidenced by provider attestation and co-signature. Mailed list of Grief and Loss Support Groups, both in-person and on-line, for review and consideration. Inter-disciplinary care team collaboration (see longitudinal plan of care). Patient Goals/Self-Care Activities: Begin personal counseling with LCSW, on a bi-weekly basis, to reduce and manage symptoms of Depression, Anxiety and Grief, until well-controlled.   Incorporate into daily practice - relaxation techniques, deep breathing exercises and mindfulness meditation strategies. Consider self-enrollment in a Grief and Loss Support Group, either in-person or on-line, from the list provided.    Bring all legal documents requiring attention to follow-up (in-person) appointment with LCSW, scheduled for 10/14/2021 at 10:30 am, to [place] ([place]).    Contact LCSW directly (# [id number]) if you have questions, need assistance, or if additional social work needs are identified between now and our next scheduled telephone outreach call.  Follow-Up:  In-Person Follow-Up Appointment with LCSW on 10/14/2021 at 10:30 am, to receive assistance with completion of legal documents.      [place] Licensed Clinical Social Worker [place] [phone]

## 2021-10-13 NOTE — Patient Instructions (Signed)
Visit Information   Thank you for taking time to visit with me today. Please don't hesitate to contact me if I can be of assistance to you before our next scheduled telephone appointment.  Following are the goals we discussed today:           Patient Goals/Self-Care Activities: Begin personal counseling with LCSW, on a bi-weekly basis, to reduce and manage symptoms of Depression, Anxiety and Grief, until well-controlled.   Incorporate into daily practice - relaxation techniques, deep breathing exercises and mindfulness meditation strategies. Consider self-enrollment in a Grief and Loss Support Group, either in-person or on-line, from the list provided.    Bring all legal documents requiring attention to follow-up (in-person) appointment with LCSW, scheduled for 10/14/2021 at 10:30 am, to [place] ([place]).    Contact LCSW directly (# [id number]) if you have questions, need assistance, or if additional social work needs are identified between now and our next scheduled telephone outreach call.    Follow-Up:  In-Person Follow-Up Appointment with LCSW on 10/14/2021 at 10:30 am, to receive assistance with completion of legal documents.      Please call the care guide team at [phone] if you need to cancel or reschedule your appointment.   If you are experiencing a Mental Health or [place] or need someone to talk to, please call the Suicide and Crisis Lifeline: 988 call the [place] National Suicide Prevention Lifeline: (709)472-4472 or TTY: 267-791-8960 TTY 6390436531) to talk to a trained counselor call 1-800-273-TALK (toll free, 24 hour hotline) go to [facility] [address] [place] [phone]) call the [facility]: [phone] call 911   Following is a copy of your full care plan:  Care Plan : LCSW Plan of Care  Updates made by Francis Gaines, LCSW since  10/13/2021 12:00 AM     Problem: Reduce and Manage My Symptoms of Depression.   Priority: High     Long-Range Goal: Reduce and Manage My Symptoms of Depression.   Start Date: 10/13/2021  Expected End Date: 02/11/2022  This Visit's Progress: On track  Priority: High  Note:   Current Barriers:   Acute Mental Health Needs related to Degenerative Arthritis of Bilateral Knees, Right Eye Vision Loss, Morbid Obesity, Major Depression, Anxiety and Grief, requires Support, Education, Resources, Referrals, Advocacy and Care Coordination in order to meet unmet mental health needs. Clinical Goal(s):  Patient will work with LCSW to reduce and manage symptoms of Depression, Anxiety and Grief, until well-controlled.     Patient will increase knowledge and/or ability of:        Coping Skills, Healthy Habits, Self-Management Skills, Stress Reduction, Home Safety and Utilizing [employer] and Resources.   Clinical Interventions:  Assessed patient's previous treatment, needs, coping skills, current treatment, support system and barriers to care. PHQ-2 and PHQ-9 Depression Screening Tool performed and results reviewed with patient. Mindfulness Meditation Strategies, Relaxation Techniques and Deep Breathing Exercises taught and encouraged, daily. Solution-Focused Therapy performed, Verbalization of Feelings encouraged, Emotional Support provided, Problem Solving Solutions developed, Brief Cognitive Behavioral Therapy initiated, Quality of Sleep assessed and Sleep Hygiene Techniques promoted, Grief and Loss Support Group Participation encouraged, Increase Level of Activity/Exercise emphasized.    Discussed plans with patient for ongoing care management follow-up and provided patient with direct contact information for care management team. Discussed several options for long-term counseling based on need and insurance, but patient denies the need for long-term counseling, nor is  she interested in being  referred to [facility], or any other community provider for counseling and supportive services.   Collaboration with Primary Care Physician, Dr. Roma Schanz regarding development and update of comprehensive plan of care as evidenced by provider attestation and co-signature. Mailed list of Grief and Loss Support Groups, both in-person and on-line, for review and consideration. Inter-disciplinary care team collaboration (see longitudinal plan of care). Patient Goals/Self-Care Activities: Begin personal counseling with LCSW, on a bi-weekly basis, to reduce and manage symptoms of Depression, Anxiety and Grief, until well-controlled.   Incorporate into daily practice - relaxation techniques, deep breathing exercises and mindfulness meditation strategies. Consider self-enrollment in a Grief and Loss Support Group, either in-person or on-line, from the list provided.    Bring all legal documents requiring attention to follow-up (in-person) appointment with LCSW, scheduled for 10/14/2021 at 10:30 am, to [place] ([place]).    Contact LCSW directly (# [id number]) if you have questions, need assistance, or if additional social work needs are identified between now and our next scheduled telephone outreach call.  Follow-Up:  In-Person Follow-Up Appointment with LCSW on 10/14/2021 at 10:30 am, to receive assistance with completion of legal documents.        Consent to CCM Services: Ms. Criado was given information about Chronic Care Management services including:  CCM service includes personalized support from designated clinical staff supervised by her physician, including individualized plan of care and coordination with other care providers 24/7 contact phone numbers for assistance for urgent and routine care needs. Service will only be billed when office clinical staff spend 20 minutes or more in a month to coordinate care. Only one practitioner may  furnish and bill the service in a calendar month. The patient may stop CCM services at any time (effective at the end of the month) by phone call to the office staff. The patient will be responsible for cost sharing (co-pay) of up to 20% of the service fee (after annual deductible is met).  Patient agreed to services and verbal consent obtained.   Patient verbalizes understanding of instructions provided today and agrees to view in [place].   Telephone follow up appointment with care management team member scheduled for:  10/14/2021 at 10:30 am  [place] Licensed Clinical Social Worker [place] [phone]

## 2021-10-14 ENCOUNTER — Ambulatory Visit (INDEPENDENT_AMBULATORY_CARE_PROVIDER_SITE_OTHER): Payer: Medicare Other | Admitting: *Deleted

## 2021-10-14 DIAGNOSIS — Z794 Long term (current) use of insulin: Secondary | ICD-10-CM

## 2021-10-14 DIAGNOSIS — H5461 Unqualified visual loss, right eye, normal vision left eye: Secondary | ICD-10-CM

## 2021-10-14 DIAGNOSIS — I10 Essential (primary) hypertension: Secondary | ICD-10-CM

## 2021-10-14 DIAGNOSIS — R5383 Other fatigue: Secondary | ICD-10-CM

## 2021-10-14 DIAGNOSIS — M159 Polyosteoarthritis, unspecified: Secondary | ICD-10-CM

## 2021-10-14 DIAGNOSIS — F418 Other specified anxiety disorders: Secondary | ICD-10-CM

## 2021-10-14 DIAGNOSIS — M1711 Unilateral primary osteoarthritis, right knee: Secondary | ICD-10-CM

## 2021-10-14 DIAGNOSIS — E1165 Type 2 diabetes mellitus with hyperglycemia: Secondary | ICD-10-CM

## 2021-10-14 DIAGNOSIS — E785 Hyperlipidemia, unspecified: Secondary | ICD-10-CM

## 2021-10-14 DIAGNOSIS — F321 Major depressive disorder, single episode, moderate: Secondary | ICD-10-CM

## 2021-10-14 DIAGNOSIS — M150 Primary generalized (osteo)arthritis: Secondary | ICD-10-CM

## 2021-10-14 NOTE — Patient Instructions (Signed)
Visit Information  Thank you for taking time to visit with me today. Please don't hesitate to contact me if I can be of assistance to you before our next scheduled telephone appointment.  Following are the goals we discussed today:   Patient Goals/Self-Care Activities: Continue to receive personal counseling with LCSW, on a bi-weekly basis, to reduce and manage symptoms of Depression, Anxiety and Grief, until well-controlled.   Continue to incorporate into daily practice - relaxation techniques, deep breathing exercises and mindfulness meditation strategies. Review and assistance with completion of Application for DIC, Death Pension, and/or Accrued Benefits ([place] Form 21P-534EZ). Review and assistance with completion of Application for Burial Benefits from the [employer]. Review and assistance with completion of Application to request a certified copy of your deceased husband's Veterans Service Record and Discharge Notice ([id number]). Collaboration with representative from The [employer] of [employer] ([place]) to obtain [employer]. Collaboration with representative from the [facility], [employer], to inquire about process for obtaining certified copies of marriage license, birth certificate, and death certificate of deceased husband. Contact LCSW directly (# [id number]) if you have questions, need assistance, or if additional social work needs are identified between now and our next scheduled telephone outreach call.   Follow-Up:  11/12/2021 at 3:00 pm  Please call the care guide team at [phone] if you need to cancel or reschedule your appointment.   If you are experiencing a Mental Health or Behavioral Health Crisis or need someone to talk to, please call the Suicide and Crisis Lifeline: 988 call the [place] National Suicide Prevention Lifeline: 478-255-7666 or TTY: (352)605-5364 TTY (780) 593-5194) to talk to a trained counselor call  1-800-273-TALK (toll free, 24 hour hotline) go to [facility] [address] [place] [phone]) call the [facility] Crisis Line: ([phone] call 911   Patient verbalizes understanding of instructions provided today and agrees to view in MyChart.   Danford Bad LCSW Licensed Clinical Social Worker [facility] [employer] [phone]

## 2021-10-14 NOTE — Chronic Care Management (AMB) (Signed)
Chronic Care Management    Clinical Social Work Note  10/14/2021 Name: Andrea Marks MRN: [id number] DOB: 02-15-1951  Andrea Marks is a 70 y.o. year old female who is a primary care patient of [name], DO. The CCM team was consulted to assist the patient with chronic disease management and/or care coordination needs related to: [employer], Mental Health Counseling and Resources, and Grief Counseling.   Engaged with patient face to face for follow up visit in response to provider referral for social work chronic care management and care coordination services.   Consent to Services:  The patient was given information about Chronic Care Management services, agreed to services, and gave verbal consent prior to initiation of services.  Please see initial visit note for detailed documentation.   Patient agreed to services and consent obtained.   Assessment: Review of patient past medical history, allergies, medications, and health status, including review of relevant consultants reports was performed today as part of a comprehensive evaluation and provision of chronic care management and care coordination services.     SDOH (Social Determinants of Health) assessments and interventions performed:    Advanced Directives Status: Not addressed in this encounter.  CCM Care Plan  Allergies  Allergen Reactions   Hydrocodone-Acetaminophen Other (See Comments)    unknown   Oxycodone Hcl Other (See Comments)    unknown   Penicillins Other (See Comments)    unknown   Prednisone     Patient has noted allergy to prednisone but she says it was a questionable history of mild nausea with prednisone and nothing more     Outpatient Encounter Medications as of 10/14/2021  Medication Sig   acetaminophen (TYLENOL) 650 MG CR tablet Take 650 mg by mouth 2 (two) times daily as needed for pain. For pain   aspirin 81 MG tablet Take 81 mg by mouth daily.    benazepril (LOTENSIN) 20 MG tablet TAKE 1 TABLET DAILY   Blood Glucose Monitoring Suppl (ONE TOUCH ULTRA SYSTEM KIT) W/DEVICE KIT 1 kit by Does not apply route once.   clonazePAM (KLONOPIN) 0.5 MG tablet 1/2-1 po qhs prn   Continuous Blood Gluc Receiver (FREESTYLE LIBRE 14 DAY READER) DEVI 1 Device by Does not apply route See admin instructions. Use for continuous monitor glucose   Continuous Blood Gluc Sensor (FREESTYLE LIBRE 14 DAY SENSOR) MISC Apply new sensor every 14 days for continuous glucose monitoring.   estradiol (ESTRACE) 1 MG tablet TAKE 1 TABLET DAILY   ezetimibe-simvastatin (VYTORIN) 10-40 MG tablet TAKE 1 TABLET AT BEDTIME   fenofibrate 160 MG tablet TAKE 1 TABLET DAILY   FLUoxetine (PROZAC) 20 MG tablet Take 3 tablets (60 mg total) by mouth daily.   glucose blood (ONETOUCH ULTRA) test strip Use to check blood sugar 3 times a day   insulin aspart (NOVOLOG FLEXPEN) 100 UNIT/ML FlexPen Inject 18-24 Units into the skin 3 (three) times daily with meals.   Insulin Glargine (BASAGLAR KWIKPEN) 100 UNIT/ML INJECT 60 UNITS            SUBCUTANEOUSLY AT BEDTIME   Insulin Pen Needle (PEN NEEDLES) 32G X 4 MM MISC USE AS INSTRUCTED WITH LANCETS.   meloxicam (MOBIC) 15 MG tablet Take 1 tablet (15 mg total) by mouth daily.   metFORMIN (GLUCOPHAGE) 850 MG tablet Take 1 tablet in the AM, and 2 tablets in the PM.   pantoprazole (PROTONIX) 40 MG tablet TAKE 1 TABLET DAILY   Semaglutide, 1 MG/DOSE, (OZEMPIC, 1  MG/DOSE,) 4 MG/3ML SOPN INJECT 1MG SUBCUTANEOUSLY  ONCE A WEEK.   Vitamin D, Ergocalciferol, (DRISDOL) 1.25 MG (50000 UNIT) CAPS capsule Take 1 capsule (50,000 Units total) by mouth every 7 (seven) days.   Facility-Administered Encounter Medications as of 10/14/2021  Medication   ipratropium-albuterol (DUONEB) 0.5-2.5 (3) MG/3ML nebulizer solution 3 mL    Patient Active Problem List   Diagnosis Date Noted   Depression with anxiety 10/01/2021   Insomnia 10/01/2021   Vision loss of right  eye    Nonintractable headache    Papilledema 02/26/2021   Degenerative arthritis of knee, bilateral 10/30/2020   Degenerative arthritis of right knee 12/25/2018   Fatigue 12/15/2018   Hyperlipidemia associated with type 2 diabetes mellitus ([place]) 12/15/2018   B12 deficiency 12/15/2018   AC (acromioclavicular) joint arthritis 06/20/2018   Left rotator cuff tear 05/29/2018   Hyperlipidemia LDL goal <70 12/19/2017   Morbid obesity ([place]) 11/15/2016   Type 2 diabetes mellitus with hyperglycemia, with long-term current use of insulin ([place]) 10/23/2015   Post-traumatic wound infection 04/17/2013   Thrombophlebitis leg 02/23/2011   TINEA CORPORIS 12/30/2008   Depression, major, single episode, moderate ([place]) 12/14/2006   Essential hypertension 12/14/2006   GERD 12/14/2006    Conditions to be addressed/monitored: Anxiety and Depression.  Limited Social Support, Mental Health Concerns, Social Isolation, Limited Access to Caregiver, and Lacks Knowledge of [employer].  Care Plan : LCSW Plan of Care  Updates made by Francis Gaines, LCSW since 10/14/2021 12:00 AM     Problem: Reduce and Manage My Symptoms of Depression.   Priority: High     Long-Range Goal: Reduce and Manage My Symptoms of Depression.   Start Date: 10/13/2021  Expected End Date: 02/11/2022  This Visit's Progress: On track  Recent Progress: On track  Priority: High  Note:   Current Barriers:   Acute Mental Health Needs related to Degenerative Arthritis of Bilateral Knees, Right Eye Vision Loss, Morbid Obesity, Major Depression, Anxiety and Grief, requires Support, Education, Resources, Referrals, Advocacy and Care Coordination in order to meet unmet mental health needs. Clinical Goal(s):  Patient will work with LCSW to reduce and manage symptoms of Depression, Anxiety and Grief, until well-controlled.     Patient will increase knowledge and/or ability of:        Coping Skills, Healthy Habits, Self-Management  Skills, Stress Reduction, Home Safety and Utilizing [employer] and Resources.   Clinical Interventions:  Solution-Focused Therapy performed, Verbalization of Feelings encouraged, Emotional Support provided, Problem Solving Solutions developed, Brief Cognitive Behavioral Therapy initiated, Quality of Sleep assessed and Sleep Hygiene Techniques promoted, Grief and Loss Support Group Participation encouraged, Increase Level of Activity/Exercise emphasized.    Collaboration with Primary Care Physician, Dr. Roma Schanz regarding development and update of comprehensive plan of care as evidenced by provider attestation and co-signature. Inter-disciplinary care team collaboration (see longitudinal plan of care). Patient Goals/Self-Care Activities: Continue to receive personal counseling with LCSW, on a bi-weekly basis, to reduce and manage symptoms of Depression, Anxiety and Grief, until well-controlled.   Continue to incorporate into daily practice - relaxation techniques, deep breathing exercises and mindfulness meditation strategies. Review and assistance with completion of Application for DIC, Death Pension, and/or Accrued Benefits ([place] Form 21P-534EZ). Review and assistance with completion of Application for Burial Benefits from the [employer]. Review and assistance with completion of Application to request a certified copy of your deceased husband's Veterans Service Record and Discharge Notice (DD-214). Collaboration with representative from The Department  of [employer] ([name]) to obtain [employer]. Collaboration with representative from the [facility], [employer] of [place], to inquire about process for obtaining certified copies of marriage license, birth certificate, and death certificate of deceased husband. Contact LCSW directly (# [phone]) if you have questions, need assistance, or if additional social work needs are identified  between now and our next scheduled telephone outreach call.  Follow-Up:  11/12/2021 at 3:00 pm      Nat Christen [place] Licensed Clinical Social Worker [place] [phone]

## 2021-10-19 ENCOUNTER — Telehealth: Payer: Self-pay | Admitting: Family Medicine

## 2021-10-19 ENCOUNTER — Ambulatory Visit: Payer: Medicare Other | Admitting: *Deleted

## 2021-10-19 DIAGNOSIS — H5461 Unqualified visual loss, right eye, normal vision left eye: Secondary | ICD-10-CM

## 2021-10-19 DIAGNOSIS — F4321 Adjustment disorder with depressed mood: Secondary | ICD-10-CM

## 2021-10-19 DIAGNOSIS — F321 Major depressive disorder, single episode, moderate: Secondary | ICD-10-CM

## 2021-10-19 DIAGNOSIS — I10 Essential (primary) hypertension: Secondary | ICD-10-CM

## 2021-10-19 DIAGNOSIS — Z794 Long term (current) use of insulin: Secondary | ICD-10-CM

## 2021-10-19 DIAGNOSIS — M170 Bilateral primary osteoarthritis of knee: Secondary | ICD-10-CM

## 2021-10-19 DIAGNOSIS — E785 Hyperlipidemia, unspecified: Secondary | ICD-10-CM

## 2021-10-19 DIAGNOSIS — R5383 Other fatigue: Secondary | ICD-10-CM

## 2021-10-19 DIAGNOSIS — F418 Other specified anxiety disorders: Secondary | ICD-10-CM

## 2021-10-19 DIAGNOSIS — M1711 Unilateral primary osteoarthritis, right knee: Secondary | ICD-10-CM

## 2021-10-19 NOTE — Patient Instructions (Signed)
Visit Information  Thank you for taking time to visit with me today. Please don't hesitate to contact me if I can be of assistance to you before our next scheduled telephone appointment.  Following are the goals we discussed today:   Patient Goals/Self-Care Activities: Continue to receive personal counseling with LCSW, on a bi-weekly basis, to reduce and manage symptoms of Depression, Anxiety and Grief, until well-controlled.   Continue to incorporate into daily practice - relaxation techniques, deep breathing exercises and mindfulness meditation strategies. Submit application for DIC, Death Pension, and/or Accrued Benefits ([place] Form 21P-534EZ), for processing.  ~  Self-addressed stamped envelop provided. Submit application for [employer] from the [employer].  ~  Self-addressed stamped envelop provided. Submit application to request a certified copy of your deceased [employer] Record and Discharge Notice (DD-214).  ~  Mail request pertaining to [employer] records (Standard Form SF 180) to the [employer]. Collaboration with representative from The Department of [employer] ([place]) to obtain [employer].  ~  Continue with outreach call attempts (# [phone]), until you are able to speak with a live person. Collaboration with representative from the [facility], [facility] of [employer], to inquire about process for obtaining certified copies of marriage license, birth certificate, and death certificate of deceased husband. ~  Obtain records in-person, and be prepared to pay a fee for each record, at the [facility], [employer] of [employer], for certified copies of marriage license, and deceased husband's birth certificate and death certificate.    Contact LCSW directly (# [phone]) if you have questions, need assistance, or if additional social work needs are identified between now and our  next scheduled telephone outreach call.   Follow-Up:  11/12/2021 at 3:00 pm  Please call the care guide team at [phone] if you need to cancel or reschedule your appointment.   If you are experiencing a Mental Health or Behavioral Health Crisis or need someone to talk to, please call the Suicide and Crisis Lifeline: 988 call the [place] National Suicide Prevention Lifeline: 509 163 5390 or TTY: 416-216-8819 TTY 531-680-2718) to talk to a trained counselor call 1-800-273-TALK (toll free, 24 hour hotline) go to [facility] [address], [place] [phone]) call the [facility] Crisis Line: (781)846-9569 call 911   Patient verbalizes understanding of instructions provided today and agrees to view in MyChart.   Danford Bad LCSW Licensed Clinical Social Worker [facility] [employer] [phone]

## 2021-10-19 NOTE — Telephone Encounter (Signed)
Patient is calling to request a call back from [place], regarding her contacting the [place] for the patient. Please advice.

## 2021-10-19 NOTE — Chronic Care Management (AMB) (Signed)
Chronic Care Management    Clinical Social Work Note  10/19/2021 Name: Andrea Marks MRN: [id number] DOB: [birth year]  Andrea Marks is a 70 y.o. year old female who is a primary care patient of [name], DO. The CCM team was consulted to assist the patient with chronic disease management and/or care coordination needs related to: Appointment Scheduling Needs, [employer], Mental Health Counseling and Resources, and Grief Counseling.   Engaged with patient by telephone for follow up visit in response to provider referral for social work chronic care management and care coordination services.   Consent to Services:  The patient was given information about Chronic Care Management services, agreed to services, and gave verbal consent prior to initiation of services.  Please see initial visit note for detailed documentation.   Patient agreed to services and consent obtained.   Assessment: Review of patient past medical history, allergies, medications, and health status, including review of relevant consultants reports was performed today as part of a comprehensive evaluation and provision of chronic care management and care coordination services.     SDOH (Social Determinants of Health) assessments and interventions performed:    Advanced Directives Status: Not addressed in this encounter.  CCM Care Plan  Allergies  Allergen Reactions   Hydrocodone-Acetaminophen Other (See Comments)    unknown   Oxycodone Hcl Other (See Comments)    unknown   Penicillins Other (See Comments)    unknown   Prednisone     Patient has noted allergy to prednisone but she says it was a questionable history of mild nausea with prednisone and nothing more     Outpatient Encounter Medications as of 10/19/2021  Medication Sig   acetaminophen (TYLENOL) 650 MG CR tablet Take 650 mg by mouth 2 (two) times daily as needed for pain. For pain   aspirin 81 MG tablet  Take 81 mg by mouth daily.   benazepril (LOTENSIN) 20 MG tablet TAKE 1 TABLET DAILY   Blood Glucose Monitoring Suppl (ONE TOUCH ULTRA SYSTEM KIT) W/DEVICE KIT 1 kit by Does not apply route once.   clonazePAM (KLONOPIN) 0.5 MG tablet 1/2-1 po qhs prn   Continuous Blood Gluc Receiver (FREESTYLE LIBRE 14 DAY READER) DEVI 1 Device by Does not apply route See admin instructions. Use for continuous monitor glucose   Continuous Blood Gluc Sensor (FREESTYLE LIBRE 14 DAY SENSOR) MISC Apply new sensor every 14 days for continuous glucose monitoring.   estradiol (ESTRACE) 1 MG tablet TAKE 1 TABLET DAILY   ezetimibe-simvastatin (VYTORIN) 10-40 MG tablet TAKE 1 TABLET AT BEDTIME   fenofibrate 160 MG tablet TAKE 1 TABLET DAILY   FLUoxetine (PROZAC) 20 MG tablet Take 3 tablets (60 mg total) by mouth daily.   glucose blood (ONETOUCH ULTRA) test strip Use to check blood sugar 3 times a day   insulin aspart (NOVOLOG FLEXPEN) 100 UNIT/ML FlexPen Inject 18-24 Units into the skin 3 (three) times daily with meals.   Insulin Glargine (BASAGLAR KWIKPEN) 100 UNIT/ML INJECT 60 UNITS            SUBCUTANEOUSLY AT BEDTIME   Insulin Pen Needle (PEN NEEDLES) 32G X 4 MM MISC USE AS INSTRUCTED WITH LANCETS.   meloxicam (MOBIC) 15 MG tablet Take 1 tablet (15 mg total) by mouth daily.   metFORMIN (GLUCOPHAGE) 850 MG tablet Take 1 tablet in the AM, and 2 tablets in the PM.   pantoprazole (PROTONIX) 40 MG tablet TAKE 1 TABLET DAILY   Semaglutide, 1 MG/DOSE, (  OZEMPIC, 1 MG/DOSE,) 4 MG/3ML SOPN INJECT $RemoveBef'1MG'[id number]$  SUBCUTANEOUSLY  ONCE A WEEK.   Vitamin D, Ergocalciferol, (DRISDOL) 1.25 MG (50000 UNIT) CAPS capsule Take 1 capsule (50,000 Units total) by mouth every 7 (seven) days.   Facility-Administered Encounter Medications as of 10/19/2021  Medication   ipratropium-albuterol (DUONEB) 0.5-2.5 (3) MG/3ML nebulizer solution 3 mL    Patient Active Problem List   Diagnosis Date Noted   Depression with anxiety 10/01/2021   Insomnia  10/01/2021   Vision loss of right eye    Nonintractable headache    Papilledema 02/26/2021   Degenerative arthritis of knee, bilateral 10/30/2020   Degenerative arthritis of right knee 12/25/2018   Fatigue 12/15/2018   Hyperlipidemia associated with type 2 diabetes mellitus ([place]) 12/15/2018   B12 deficiency 12/15/2018   AC (acromioclavicular) joint arthritis 06/20/2018   Left rotator cuff tear 05/29/2018   Hyperlipidemia LDL goal <70 12/19/2017   Morbid obesity ([place]) 11/15/2016   Type 2 diabetes mellitus with hyperglycemia, with long-term current use of insulin ([place]) 10/23/2015   Post-traumatic wound infection 04/17/2013   Thrombophlebitis leg 02/23/2011   TINEA CORPORIS 12/30/2008   Depression, major, single episode, moderate ([place]) 12/14/2006   Essential hypertension 12/14/2006   GERD 12/14/2006    Conditions to be addressed/monitored: Anxiety and Depression.  Limited Social Support, Mental Health Concerns, Social Isolation, Limited Access to Caregiver, and Lacks Knowledge of [employer].  Care Plan : LCSW Plan of Care  Updates made by Francis Gaines, LCSW since 10/19/2021 12:00 AM     Problem: Reduce and Manage My Symptoms of Depression.   Priority: High     Long-Range Goal: Reduce and Manage My Symptoms of Depression.   Start Date: 10/13/2021  Expected End Date: 02/11/2022  This Visit's Progress: On track  Recent Progress: On track  Priority: High  Note:   Current Barriers:   Acute Mental Health Needs related to Degenerative Arthritis of Bilateral Knees, Right Eye Vision Loss, Morbid Obesity, Major Depression, Anxiety and Grief, requires Support, Education, Resources, Referrals, Advocacy and Care Coordination in order to meet unmet mental health needs. Clinical Goal(s):  Patient will work with LCSW to reduce and manage symptoms of Depression, Anxiety and Grief, until well-controlled.     Patient will increase knowledge and/or ability of:        Coping Skills,  Healthy Habits, Self-Management Skills, Stress Reduction, Home Safety and Utilizing [employer] and Resources.   Clinical Interventions:  Solution-Focused Therapy performed, Verbalization of Feelings encouraged, Emotional Support provided, Problem Solving Solutions developed, Brief Cognitive Behavioral Therapy initiated, Quality of Sleep assessed and Sleep Hygiene Techniques promoted, Grief and Loss Support Group Participation encouraged, Increase Level of Activity/Exercise emphasized.    Collaboration with Primary Care Physician, Dr. Roma Schanz regarding development and update of comprehensive plan of care as evidenced by provider attestation and co-signature. Inter-disciplinary care team collaboration (see longitudinal plan of care). Patient Goals/Self-Care Activities: Continue to receive personal counseling with LCSW, on a bi-weekly basis, to reduce and manage symptoms of Depression, Anxiety and Grief, until well-controlled.   Continue to incorporate into daily practice - relaxation techniques, deep breathing exercises and mindfulness meditation strategies. Submit application for DIC, Death Pension, and/or Accrued Benefits ([place] Form 21P-534EZ), for processing.  ~  Self-addressed stamped envelop provided. Submit application for [employer] from the [employer].  ~  Self-addressed stamped envelop provided. Submit application to request a certified copy of your deceased [employer] Insurance Record and Discharge Notice (DD-214).  ~  Mail request pertaining to [employer] records (Standard Form SF 180) to the [facility]. Collaboration with representative from [place] ([name]) to obtain [employer].  ~  Continue with outreach call attempts (# [phone]), until you are able to speak with a live person. Collaboration with representative from the [facility], [employer] of [place], to inquire  about process for obtaining certified copies of marriage license, birth certificate, and death certificate of deceased husband. ~  Obtain records in-person, and be prepared to pay a fee for each record, at the [facility], [employer] of [employer], for certified copies of marriage license, and deceased husband's birth certificate and death certificate.    Contact LCSW directly (# [phone]) if you have questions, need assistance, or if additional social work needs are identified between now and our next scheduled telephone outreach call.  Follow-Up:  11/12/2021 at 3:00 pm      Nat Christen [place] Licensed Clinical Social Worker [place] [phone]

## 2021-10-20 ENCOUNTER — Other Ambulatory Visit: Payer: Self-pay

## 2021-10-20 ENCOUNTER — Ambulatory Visit (INDEPENDENT_AMBULATORY_CARE_PROVIDER_SITE_OTHER): Payer: Medicare Other | Admitting: Sports Medicine

## 2021-10-20 VITALS — BP 110/80 | HR 76 | Ht 63.0 in | Wt 272.0 lb

## 2021-10-20 DIAGNOSIS — M84452D Pathological fracture, left femur, subsequent encounter for fracture with routine healing: Secondary | ICD-10-CM

## 2021-10-20 NOTE — Progress Notes (Signed)
Andrea Marks D.[place] Phone: [phone]   Assessment and Plan:     1. 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  We have attempted multiple injections, nonweightbearing, weightbearing as tolerated, with no improvement in worsening symptoms - We will reorder MRI to further evaluate subchondral fracture healing and will need to consider orthopedic surgery referral in the future - Instructed to use Tylenol for daily pain relief - MR Knee Left  Wo Contrast; Future    Pertinent previous records reviewed include MRI left knee 06/28/2021, x-ray left knee 08/25/2021, knee x-ray 06/18/2021   Follow Up: Patient to call back after MRI is completed to review MRI and discuss treatment plan.  Could consider PRP.  Likely will need to consider referral to orthopedic surgery   Subjective:    I, Andrea Marks, am serving as a Education administrator for Doctor Glennon Mac  Chief Complaint: left knee and left femur pain   HPI:   08/25/21 Patient is a 70 year old female presenting with bilateral knee pain. Patient's husband unexpectedly died and she has been very busy. Patient had declined both meloxicam and tramadol offered by Dr. Tamala Julian. Today patient states that her pain has increased over past month. Pain in L knee is radiating up and down leg. Right knee is also painful. Patient has had to take care of herself past month since passing of her husband.    09/08/21 Patient states that the knee injection helped for about a week and then the pain went right back to where is was. Pain behind  the knee is worse and it radiates down the back of the leg.    10/20/21 Patient states that her leg is killing her radiates all the way down and behind her knee    Relevant Historical Information: Subchondral femoral fracture seen on MRI, recent passing away of her husband.  DM type II.     Relevant Historical Information: Subchondral fracture seen on MRI, DM type II  Additional pertinent review of systems negative.   Current Outpatient Medications:    acetaminophen (TYLENOL) 650 MG CR tablet, Take 650 mg by mouth 2 (two) times daily as needed for pain. For pain, Disp: , Rfl:    aspirin 81 MG tablet, Take 81 mg by mouth daily., Disp: , Rfl:    benazepril (LOTENSIN) 20 MG tablet, TAKE 1 TABLET DAILY, Disp: 90 tablet, Rfl: 0   Blood Glucose Monitoring Suppl (ONE TOUCH ULTRA SYSTEM KIT) W/DEVICE KIT, 1 kit by Does not apply route once., Disp: 1 each, Rfl: 0   clonazePAM (KLONOPIN) 0.5 MG tablet, 1/2-1 po qhs prn, Disp: 20 tablet, Rfl: 1   Continuous Blood Gluc Receiver (FREESTYLE LIBRE 14 DAY READER) DEVI, 1 Device by Does not apply route See admin instructions. Use for continuous monitor glucose, Disp: 1 each, Rfl: 0   Continuous Blood Gluc Sensor (FREESTYLE LIBRE 14 DAY SENSOR) MISC, Apply new sensor every 14 days for continuous glucose monitoring., Disp: 6 each, Rfl: 2   estradiol (ESTRACE) 1 MG tablet, TAKE 1 TABLET DAILY, Disp: 90 tablet, Rfl: 1   ezetimibe-simvastatin (VYTORIN) 10-40 MG tablet, TAKE 1 TABLET AT BEDTIME, Disp: 90  tablet, Rfl: 1   fenofibrate 160 MG tablet, TAKE 1 TABLET DAILY, Disp: 90 tablet, Rfl: 1   FLUoxetine (PROZAC) 20 MG tablet, Take 3 tablets (60 mg total) by mouth daily., Disp: 270 tablet, Rfl: 3   glucose blood (ONETOUCH ULTRA) test strip, Use to check blood sugar 3 times a day, Disp: 100 each, Rfl: 0   insulin aspart (NOVOLOG FLEXPEN) 100 UNIT/ML FlexPen, Inject 18-24 Units into the skin 3 (three) times daily with meals., Disp: 45 mL, Rfl: 3   Insulin  Glargine (BASAGLAR KWIKPEN) 100 UNIT/ML, INJECT 60 UNITS            SUBCUTANEOUSLY AT BEDTIME, Disp: 45 mL, Rfl: 3   Insulin Pen Needle (PEN NEEDLES) 32G X 4 MM MISC, USE AS INSTRUCTED WITH LANCETS., Disp: 400 each, Rfl: 3   meloxicam (MOBIC) 15 MG tablet, Take 1 tablet (15 mg total) by mouth daily., Disp: 30 tablet, Rfl: 0   metFORMIN (GLUCOPHAGE) 850 MG tablet, Take 1 tablet in the AM, and 2 tablets in the PM., Disp: 270 tablet, Rfl: 3   pantoprazole (PROTONIX) 40 MG tablet, TAKE 1 TABLET DAILY, Disp: 90 tablet, Rfl: 3   Semaglutide, 1 MG/DOSE, (OZEMPIC, 1 MG/DOSE,) 4 MG/3ML SOPN, INJECT $RemoveBefo'1MG'vgUYUlYLcqg$  SUBCUTANEOUSLY  ONCE A WEEK., Disp: 9 mL, Rfl: 3   Vitamin D, Ergocalciferol, (DRISDOL) 1.25 MG (50000 UNIT) CAPS capsule, Take 1 capsule (50,000 Units total) by mouth every 7 (seven) days., Disp: 12 capsule, Rfl: 0  Current Facility-Administered Medications:    ipratropium-albuterol (DUONEB) 0.5-2.5 (3) MG/3ML nebulizer solution 3 mL, 3 mL, Nebulization, Q6H, Copland, Jessica C, MD, 3 mL at 11/15/16 1330   Objective:     Vitals:   10/20/21 1253  BP: 110/80  Pulse: 76  SpO2: 98%  Weight: 272 lb (123.4 kg)  Height: $Remove'5\' 3"'lPffEEe$  (1.6 m)      Body mass index is 48.18 kg/m.    Physical Exam:    Left knee: Moderate bilateral swelling of both legs No deformity Neg fluid wave, joint milking ROM Flex 80, Ext 10 TTP lateral femoral condyle, lateral joint line, medial joint line, posterior fossa  NTTP over the quad tendon, medial fem condyle,  patella, plica, patella tendon, tibial tuberostiy, fibular head,  pes anserine bursa, gerdy's tubercle,  Neg anterior and posterior drawer Neg lachman Neg sag sign Negative varus stress Negative valgus stress + McMurray   Gait shortened, favoring right leg   Electronically signed by:  Andrea Marks D.[facility] 1:16 PM 10/20/21

## 2021-10-20 NOTE — Patient Instructions (Addendum)
Good to see you MRI ordered call [phone] to schedule  Follow up after the MRI

## 2021-10-26 ENCOUNTER — Other Ambulatory Visit: Payer: Medicare Other

## 2021-10-26 ENCOUNTER — Encounter: Payer: Self-pay | Admitting: Sports Medicine

## 2021-10-28 ENCOUNTER — Ambulatory Visit: Payer: Medicare Other | Admitting: Sports Medicine

## 2021-10-31 DIAGNOSIS — F321 Major depressive disorder, single episode, moderate: Secondary | ICD-10-CM | POA: Diagnosis not present

## 2021-10-31 DIAGNOSIS — E1165 Type 2 diabetes mellitus with hyperglycemia: Secondary | ICD-10-CM

## 2021-10-31 DIAGNOSIS — F4321 Adjustment disorder with depressed mood: Secondary | ICD-10-CM

## 2021-10-31 DIAGNOSIS — I10 Essential (primary) hypertension: Secondary | ICD-10-CM | POA: Diagnosis not present

## 2021-10-31 DIAGNOSIS — Z794 Long term (current) use of insulin: Secondary | ICD-10-CM

## 2021-10-31 DIAGNOSIS — M1711 Unilateral primary osteoarthritis, right knee: Secondary | ICD-10-CM

## 2021-10-31 DIAGNOSIS — F418 Other specified anxiety disorders: Secondary | ICD-10-CM

## 2021-10-31 DIAGNOSIS — M170 Bilateral primary osteoarthritis of knee: Secondary | ICD-10-CM

## 2021-10-31 DIAGNOSIS — E785 Hyperlipidemia, unspecified: Secondary | ICD-10-CM | POA: Diagnosis not present

## 2021-10-31 DIAGNOSIS — M159 Polyosteoarthritis, unspecified: Secondary | ICD-10-CM

## 2021-11-04 ENCOUNTER — Encounter: Payer: Self-pay | Admitting: Podiatry

## 2021-11-04 ENCOUNTER — Ambulatory Visit (INDEPENDENT_AMBULATORY_CARE_PROVIDER_SITE_OTHER): Payer: Medicare Other | Admitting: Podiatry

## 2021-11-04 ENCOUNTER — Other Ambulatory Visit: Payer: Self-pay

## 2021-11-04 DIAGNOSIS — Z794 Long term (current) use of insulin: Secondary | ICD-10-CM | POA: Diagnosis not present

## 2021-11-04 DIAGNOSIS — E1151 Type 2 diabetes mellitus with diabetic peripheral angiopathy without gangrene: Secondary | ICD-10-CM

## 2021-11-04 DIAGNOSIS — L84 Corns and callosities: Secondary | ICD-10-CM

## 2021-11-04 NOTE — Progress Notes (Signed)
This patient returns to my office for at risk foot care.  This patient requires this care by a professional since this patient will be at risk due to having diabetes and history of thrombophlebitis.  This patient says her callus on her big toes is painful walking and wearing her shoes.  She is unable to self treat.  This patient presents for at risk foot care today.  General Appearance  Alert, conversant and in no acute stress.  Vascular  Dorsalis pedis and posterior tibial  pulses are not  palpable  Bilaterally  Due to swelling..  Capillary return is within normal limits  bilaterally. Temperature is within normal limits  bilaterally.  Neurologic  Senn-Weinstein monofilament wire test diminished  bilaterally. Muscle power within normal limits bilaterally.  Nails Thick disfigured discolored nails with subungual debris  from hallux to fifth toes bilaterally. No evidence of bacterial infection or drainage bilaterally.  Orthopedic  No limitations of motion  feet .  No crepitus or effusions noted.  No bony pathology or digital deformities noted.  Skin  normotropic skin with no porokeratosis noted bilaterally.  No signs of infections or ulcers noted.   Pinch callus  B/L.   No infection or drainage noted.  Onychomycosis  Pain in right toes  Pain in left toes    Consent was obtained for treatment procedures.   Mechanical debridement of nails 1-5  bilaterally performed with a nail nipper.  Filed with dremel without incident.     Return office visit   9  weeks                  Told patient to return for periodic foot care and evaluation due to potential at risk complications.   Fatemah Pourciau DPM  

## 2021-11-10 ENCOUNTER — Other Ambulatory Visit: Payer: Self-pay | Admitting: Internal Medicine

## 2021-11-10 ENCOUNTER — Ambulatory Visit
Admission: RE | Admit: 2021-11-10 | Discharge: 2021-11-10 | Disposition: A | Discharge status: Home / Self Care | Payer: Medicare Other | Source: Ambulatory Visit | Attending: Sports Medicine | Admitting: Sports Medicine

## 2021-11-10 DIAGNOSIS — M84452D Pathological fracture, left femur, subsequent encounter for fracture with routine healing: Secondary | ICD-10-CM

## 2021-11-10 IMAGING — MR MR KNEE*L* W/O CM
6 series · 40 of 40 positions shown · non-contrast
Comparison: MR knee [DATE]; X-ray knee [DATE]

CLINICAL DATA: Left chronic knee pain on and off for 6 months.

EXAM:
MRI OF THE LEFT KNEE WITHOUT CONTRAST
TECHNIQUE: Multiplanar, multisequence MR imaging of the knee was performed. No
intravenous contrast was administered.

[Series 4: T2 fat-sat · axial · left · 4.0mm · 0.56mm/px · z∈[-71,+65]mm · 8 of 32 slices shown (1 of 3)]
[im 1/32]
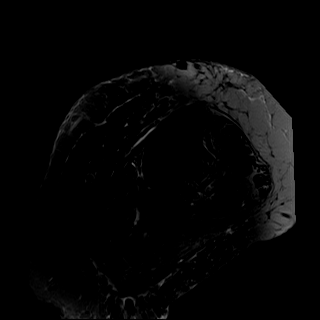
[im 5/32]
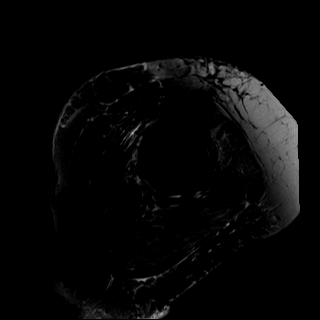
[im 9/32]
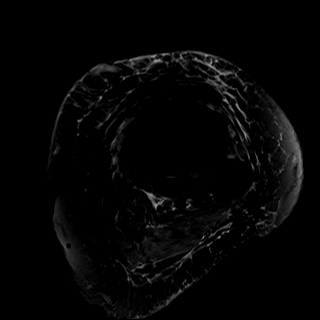
[im 14/32]
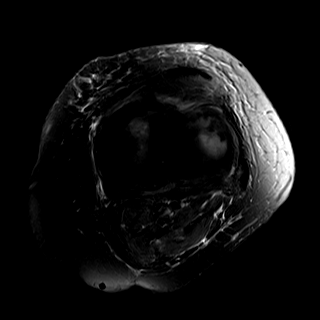
[im 18/32]
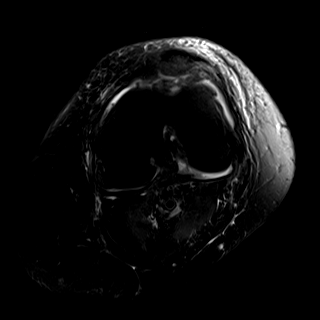
[im 23/32]
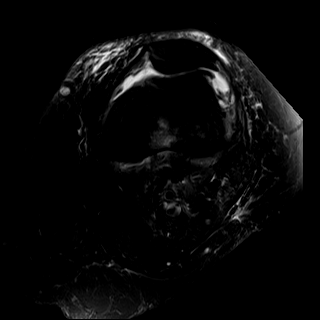
[im 27/32]
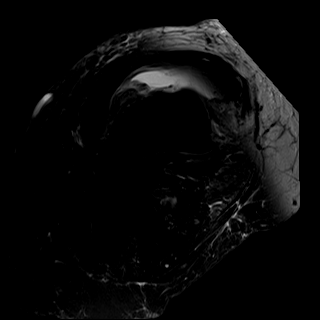
[im 32/32]
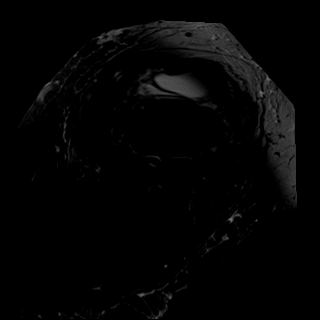

[Series 5: T2 fat-sat · oblique · left · 4.0mm · 0.56mm/px · 7 of 28 slices shown (2 of 3)]
[im 1/28]
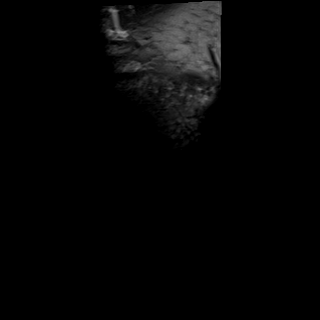
[im 5/28]
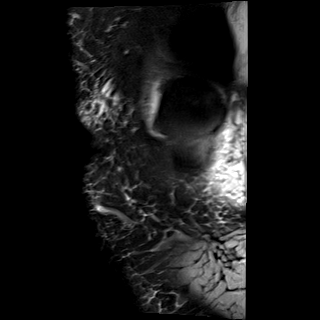
[im 10/28]
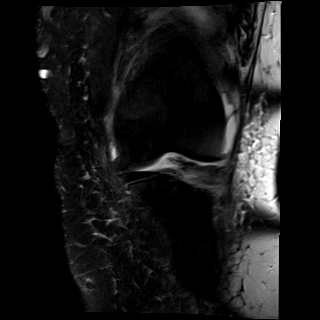
[im 14/28]
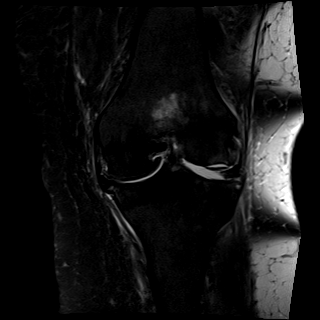
[im 19/28]
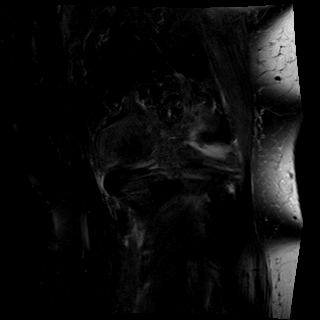
[im 23/28]
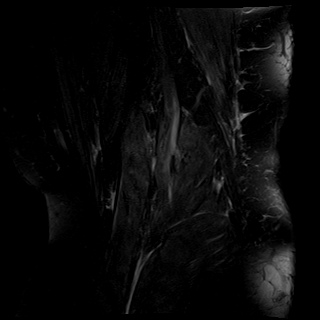
[im 28/28]
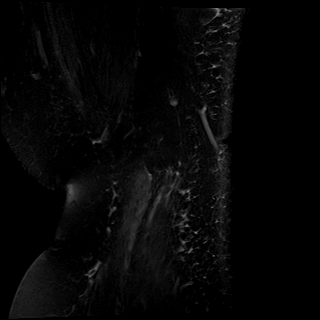

[Series 6: T1 · oblique · left · 4.0mm · 0.56mm/px · 6 of 28 slices shown]
[im 1/28]
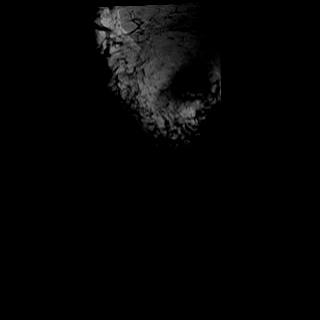
[im 6/28]
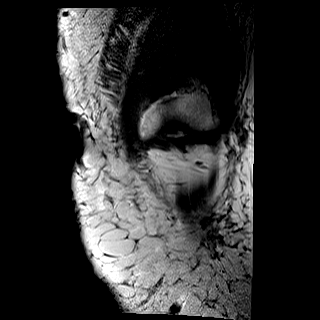
[im 11/28]
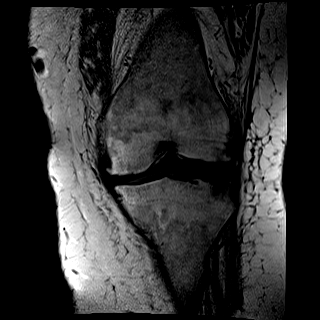
[im 17/28]
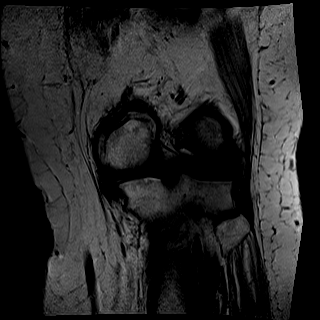
[im 22/28]
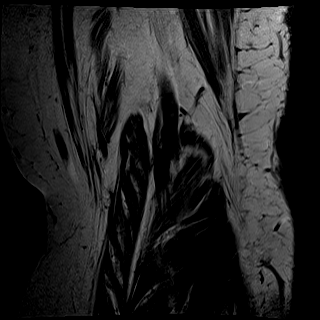
[im 28/28]
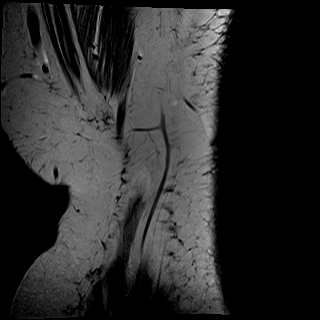

[Series 7: PD fat-sat · oblique · left · 3.3mm · 0.70mm/px · 7 of 29 slices shown (1 of 2)]
[im 1/29]
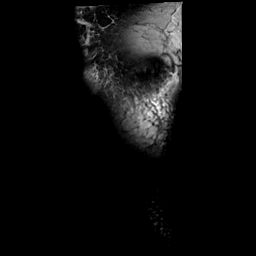
[im 5/29]
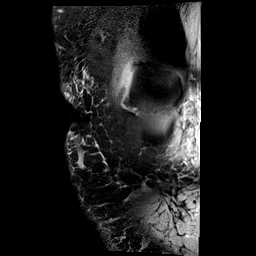
[im 10/29]
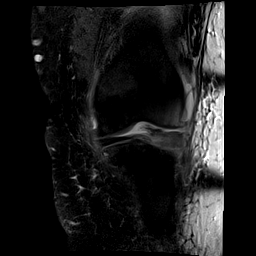
[im 15/29]
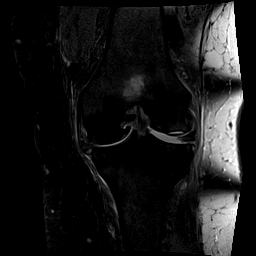
[im 19/29]
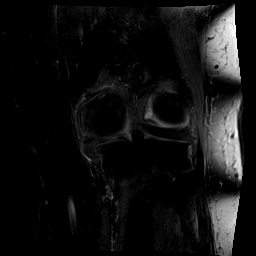
[im 24/29]
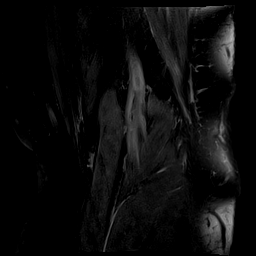
[im 29/29]
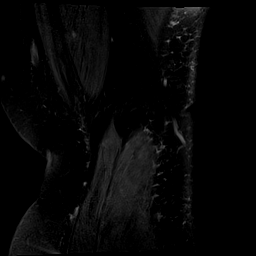

[Series 100: PD fat-sat · oblique · left · 3.3mm · 0.56mm/px · 6 of 28 slices shown (2 of 2)]
[im 1/28]
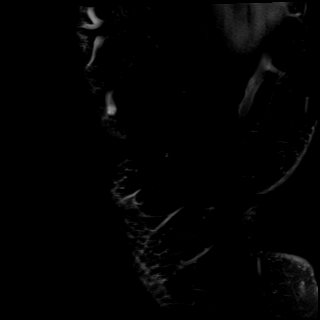
[im 6/28]
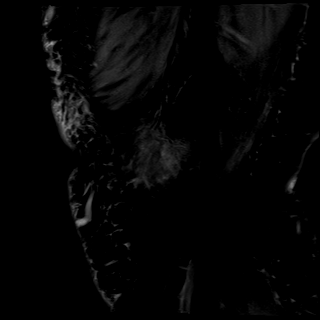
[im 11/28]
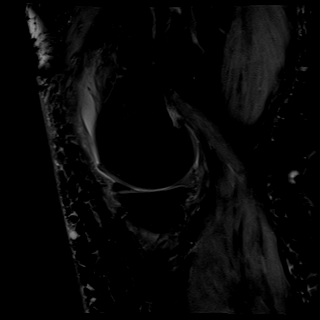
[im 17/28]
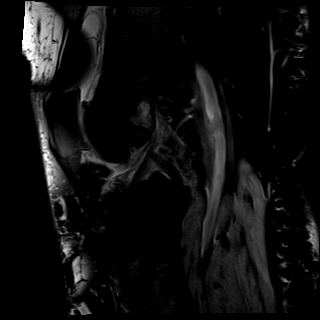
[im 22/28]
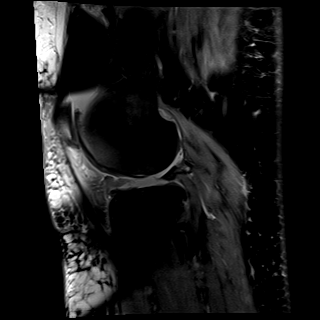
[im 28/28]
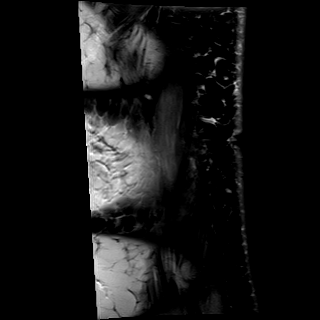

[Series 101: T2 fat-sat · oblique · left · 3.3mm · 0.56mm/px · 6 of 28 slices shown (3 of 3)]
[im 1/28]
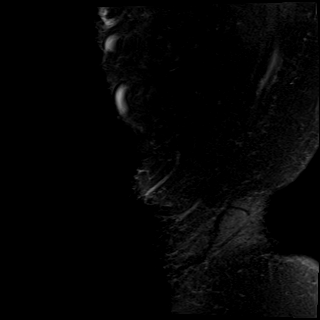
[im 6/28]
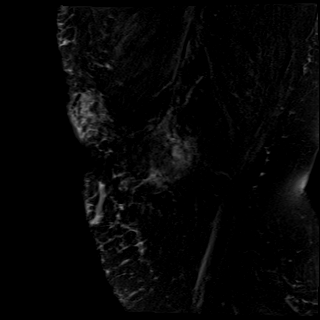
[im 11/28]
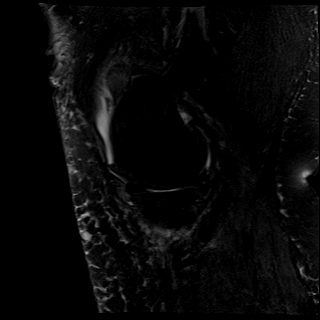
[im 17/28]
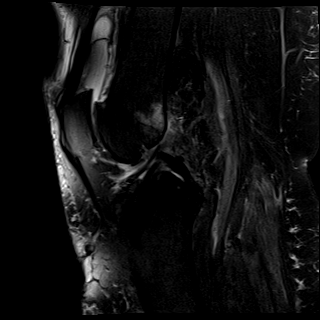
[im 22/28]
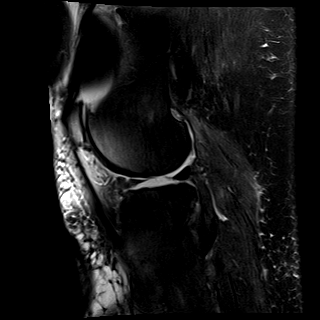
[im 28/28]
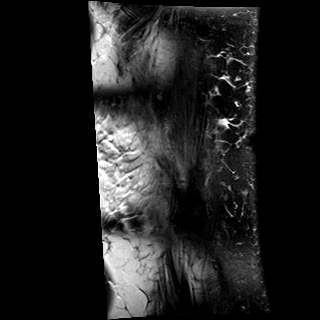

[40 of 40 positions shown; findings below may reference images not displayed]

FINDINGS: MENISCI

Medial: Degeneration of the medial meniscus. Complex tear of the
posterior horn of the medial meniscus with a radial component and
peripheral meniscal extrusion.

Lateral: Complex tear of the anterior horn of the lateral meniscus
extending into the anterior horn-body junction.

LIGAMENTS

Cruciates: ACL and PCL are intact.

Collaterals: Medial collateral ligament is intact. Lateral
collateral ligament complex is intact.

CARTILAGE

Patellofemoral: High-grade partial-thickness cartilage loss with
areas of full-thickness cartilage loss of the patellofemoral
compartment.

Medial: Full-thickness cartilage loss of the medial femorotibial
compartment.

Lateral: Extensive full-thickness cartilage loss of the lateral
femorotibial compartment.

JOINT: Large joint effusion. Normal DRAVEN. No plical
thickening.

POPLITEAL FOSSA: Popliteus tendon is intact. No Baker's cyst.

EXTENSOR MECHANISM: Intact quadriceps tendon. Intact patellar
tendon. Intact lateral patellar retinaculum. Intact medial patellar
retinaculum. Intact MPFL.

BONES: No aggressive osseous lesion. Subchondral fracture involving
the weight-bearing surface of the lateral femoral condyle extending
through the articular surface with subchondral fluid extending deep
to the subchondral fracture and minimal subchondral bone marrow
edema. Subchondral bone marrow edema and linear low signal along the
anterior aspect of the lateral tibial plateau concerning for a
subchondral insufficiency fracture without depression or
displacement. Subcortical reactive marrow changes at the ACL origin.

Other: No fluid collection or hematoma. Muscles are normal.
IMPRESSION: 1. Degeneration of the medial meniscus. Complex tear of the
posterior horn of the medial meniscus with a radial component and
peripheral meniscal extrusion.
2. Complex tear of the anterior horn of the lateral meniscus
extending into the anterior horn-body junction.
3. Subchondral fracture involving the weight-bearing surface of the
lateral femoral condyle extending through the articular surface with
subchondral fluid extending deep to the subchondral fracture and
minimal subchondral bone marrow edema.
4. Nondisplaced, nondepressed subchondral insufficiency fracture of
the anterior aspect of the lateral tibial plateau.

## 2021-11-12 ENCOUNTER — Ambulatory Visit (INDEPENDENT_AMBULATORY_CARE_PROVIDER_SITE_OTHER): Payer: Medicare Other | Admitting: *Deleted

## 2021-11-12 ENCOUNTER — Telehealth: Payer: Self-pay | Admitting: *Deleted

## 2021-11-12 DIAGNOSIS — F418 Other specified anxiety disorders: Secondary | ICD-10-CM

## 2021-11-12 DIAGNOSIS — H4710 Unspecified papilledema: Secondary | ICD-10-CM

## 2021-11-12 DIAGNOSIS — I10 Essential (primary) hypertension: Secondary | ICD-10-CM

## 2021-11-12 DIAGNOSIS — H5461 Unqualified visual loss, right eye, normal vision left eye: Secondary | ICD-10-CM

## 2021-11-12 DIAGNOSIS — E1165 Type 2 diabetes mellitus with hyperglycemia: Secondary | ICD-10-CM

## 2021-11-12 DIAGNOSIS — E785 Hyperlipidemia, unspecified: Secondary | ICD-10-CM

## 2021-11-12 DIAGNOSIS — M159 Polyosteoarthritis, unspecified: Secondary | ICD-10-CM

## 2021-11-12 DIAGNOSIS — Z794 Long term (current) use of insulin: Secondary | ICD-10-CM

## 2021-11-12 DIAGNOSIS — M170 Bilateral primary osteoarthritis of knee: Secondary | ICD-10-CM

## 2021-11-12 DIAGNOSIS — F321 Major depressive disorder, single episode, moderate: Secondary | ICD-10-CM

## 2021-11-12 DIAGNOSIS — R5383 Other fatigue: Secondary | ICD-10-CM

## 2021-11-12 DIAGNOSIS — M150 Primary generalized (osteo)arthritis: Secondary | ICD-10-CM

## 2021-11-12 DIAGNOSIS — M1711 Unilateral primary osteoarthritis, right knee: Secondary | ICD-10-CM

## 2021-11-12 DIAGNOSIS — E1169 Type 2 diabetes mellitus with other specified complication: Secondary | ICD-10-CM

## 2021-11-12 NOTE — Chronic Care Management (AMB) (Signed)
Chronic Care Management    Clinical Social Work Note  11/12/2021 Name: Andrea Marks MRN: [id number] DOB: October 04, 1951  Andrea Marks is a 71 y.o. year old female who is a primary care patient of [name], DO. The CCM team was consulted to assist the patient with chronic disease management and/or care coordination needs related to: [employer], Mental Health Counseling and Resources, and Grief Counseling.   Engaged with patient by telephone for follow up visit in response to provider referral for social work chronic care management and care coordination services.   Consent to Services:  The patient was given information about Chronic Care Management services, agreed to services, and gave verbal consent prior to initiation of services.  Please see initial visit note for detailed documentation.   Patient agreed to services and consent obtained.   Assessment: Review of patient past medical history, allergies, medications, and health status, including review of relevant consultants reports was performed today as part of a comprehensive evaluation and provision of chronic care management and care coordination services.     SDOH (Social Determinants of Health) assessments and interventions performed:    Advanced Directives Status: Not addressed in this encounter.  CCM Care Plan  Allergies  Allergen Reactions   Hydrocodone-Acetaminophen Other (See Comments)    unknown   Oxycodone Hcl Other (See Comments)    unknown   Penicillins Other (See Comments)    unknown   Prednisone     Patient has noted allergy to prednisone but she says it was a questionable history of mild nausea with prednisone and nothing more     Outpatient Encounter Medications as of 11/12/2021  Medication Sig   acetaminophen (TYLENOL) 650 MG CR tablet Take 650 mg by mouth 2 (two) times daily as needed for pain. For pain   aspirin 81 MG tablet Take 81 mg by mouth daily.    benazepril (LOTENSIN) 20 MG tablet TAKE 1 TABLET DAILY   Blood Glucose Monitoring Suppl (ONE TOUCH ULTRA SYSTEM KIT) W/DEVICE KIT 1 kit by Does not apply route once.   clonazePAM (KLONOPIN) 0.5 MG tablet 1/2-1 po qhs prn   Continuous Blood Gluc Receiver (FREESTYLE LIBRE 14 DAY READER) DEVI 1 Device by Does not apply route See admin instructions. Use for continuous monitor glucose   Continuous Blood Gluc Sensor (FREESTYLE LIBRE 14 DAY SENSOR) MISC Apply new sensor every 14 days for continuous glucose monitoring.   estradiol (ESTRACE) 1 MG tablet TAKE 1 TABLET DAILY   ezetimibe-simvastatin (VYTORIN) 10-40 MG tablet TAKE 1 TABLET AT BEDTIME   fenofibrate 160 MG tablet TAKE 1 TABLET DAILY   FLUoxetine (PROZAC) 20 MG tablet Take 3 tablets (60 mg total) by mouth daily.   glucose blood (ONETOUCH ULTRA) test strip Use to check blood sugar 3 times a day   insulin aspart (NOVOLOG FLEXPEN) 100 UNIT/ML FlexPen Inject 18-24 Units into the skin 3 (three) times daily with meals.   Insulin Glargine (BASAGLAR KWIKPEN) 100 UNIT/ML INJECT 60 UNITS            SUBCUTANEOUSLY AT BEDTIME   Insulin Pen Needle (PEN NEEDLES) 32G X 4 MM MISC USE AS INSTRUCTED WITH LANCETS.   meloxicam (MOBIC) 15 MG tablet Take 1 tablet (15 mg total) by mouth daily.   metFORMIN (GLUCOPHAGE) 850 MG tablet Take 1 tablet in the AM, and 2 tablets in the PM.   pantoprazole (PROTONIX) 40 MG tablet TAKE 1 TABLET DAILY   Semaglutide, 1 MG/DOSE, (OZEMPIC, 1  4 MG/3ML SOPN INJECT 1MG SUBCUTANEOUSLY  ONCE A WEEK.  ° Vitamin D, Ergocalciferol, (DRISDOL) 1.25 MG (50000 UNIT) CAPS capsule Take 1 capsule (50,000 Units total) by mouth every 7 (seven) days.  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  ° Degenerative arthritis of knee, bilateral 10/30/2020  ° Degenerative arthritis of right knee 12/25/2018  ° Fatigue 12/15/2018  ° Hyperlipidemia associated with type 2 diabetes mellitus (HCC) 12/15/2018  ° B12 deficiency 12/15/2018  ° AC (acromioclavicular) joint arthritis 06/20/2018  ° Left rotator cuff tear 05/29/2018  ° Hyperlipidemia LDL goal <70 12/19/2017  ° Morbid obesity (HCC) 11/15/2016  ° Type 2 diabetes mellitus with hyperglycemia, with long-term current use of insulin (HCC) 10/23/2015  ° 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  Limited Social Support, Mental Health Concerns, Social Isolation, and Lacks Knowledge of Community Resources. ° °Care Plan : LCSW Plan of Care  °Updates made by Saporito, Joanna D, LCSW since 11/12/2021 12:00 AM  °  ° °Problem: Reduce and Manage My Symptoms of Depression.   °Priority: High  °  ° °Long-Range Goal: Reduce and Manage My Symptoms of Depression.   °Start Date: 10/13/2021  °Expected End Date: 02/11/2022  °This Visit's Progress: On track  °Recent Progress: On track  °Priority: High  °Note:   °Current Barriers:   °Acute Mental Health Needs related to Degenerative Arthritis of Bilateral Knees, Right Eye Vision Loss, Morbid Obesity, Major Depression, Anxiety and Grief, requires Support, Education, Resources, Referrals, Advocacy and Care Coordination in order to meet unmet mental health needs. °Clinical Goal(s):  °Patient will work with LCSW to reduce and manage symptoms of Depression, Anxiety and Grief, until well-controlled.     °Patient will increase knowledge and/or ability of:  °      Coping Skills, Healthy Habits, Self-Management Skills, Stress  Reduction, Home Safety and Utilizing Community Agencies and Resources.   °Clinical Interventions:  °Solution-Focused Therapy performed, Verbalization of Feelings encouraged, Emotional Support provided, Problem Solving Solutions developed, Brief Cognitive Behavioral Therapy initiated, Quality of Sleep assessed and Sleep Hygiene Techniques promoted, Grief and Loss Support Group Participation encouraged, Increase Level of Activity/Exercise emphasized.    °Collaboration with Primary Care Physician, Dr. Yvonne Lowne Chase regarding development and update of comprehensive plan of care as evidenced by provider attestation and co-signature. °Inter-disciplinary care team collaboration (see longitudinal plan of care). °Patient Goals/Self-Care Activities: °Continue to receive personal counseling with LCSW, on a bi-weekly basis, to reduce and manage symptoms of Depression, Anxiety and Grief, until well-controlled.   °Continue to try and incorporate into daily practice - relaxation techniques, deep breathing exercises, and mindfulness meditation strategies. °Please consider self-enrollment in a grief and loss support group, from the following list provided, e-mailed ([email or web address]) to you, per your request: °~ Grief Recovery Support Groups - Grief Share °- The Journey of Grief (Parts I and II) °- Lessons of Grief (Parts I and II) °- Challenges of Grief °- What Do I Live for Now  °~ Widowed Village   Village Grief and Loss Support Groups - Conservation officer, historic buildings   - Support for Newly Widowed - Surveyor, quantity on Widowhood - Access to a Widowed Pen Pal ~ [place] or Group Sessions Facilitated by Architectural technologist  - The Bereaved Widow ~ Grief Support - Manufacturing engineer   - Grief Support for Adults  - One-On-One Grief Counseling for Adults - Support Groups for Adults - Workshops for Adults - Grief Resources for Adults Contact LCSW directly  (# [phone]) if you have questions, need assistance, or if additional social work needs are identified between now and our next scheduled telephone outreach call.  Follow-Up:  12/10/2021 at 10:45 am      [place] Clinical Social Worker [place] [phone]

## 2021-11-12 NOTE — Telephone Encounter (Signed)
Erroneous Encounter.  Elham Fini LCSW Licensed Clinical Social Worker [facility] [phone]  

## 2021-11-12 NOTE — Progress Notes (Signed)
Andrea Marks D.[place] Phone: [phone]   Assessment and Plan:     1. Subchondral insufficiency fracture of condyle of left femur with delayed healing, subsequent encounter 2. Primary osteoarthritis of left knee 3. Degenerative tear of meniscus of left knee 4. Chronic pain of left knee -Chronic with exacerbation, subsequent visit - Continued significant left knee pain with instability that has not improved with conservative therapy, injections - Repeat MRI showed general progression of conditions, and delayed healing.  Reviewed MRI in room with patient which included multiple meniscal tears, subchondral fracture of the lateral femoral condyle and subchondral insufficiency fracture of anterior lateral tibial plateau - I believe the patient would benefit from an orthopedic surgery evaluation, to potentially discuss total knee replacement.  Patient is hesitant to have total knee replacement, however I think she should discuss the urgency of this procedure with an orthopedic surgeon.  Patient is agreeable -Advised the patient should be nonweightbearing on her knee.  She says that she is unable to do this because she lives alone and has stairs going up to her bedroom as well as into her residence.  If patient is unable to be nonweightbearing, recommend using Rollator and canes as much as possible due to instability of the knee  Pertinent previous records reviewed include MRI 11/10/2021   Follow Up: As needed if new musculoskeletal complaints, or if patient has other questions or concerns   Subjective:   I, Andrea Marks, am serving as a Education administrator for Doctor Glennon Mac  Chief Complaint: left knee and leg pain   HPI:   08/25/21 Patient is a 71 year old female presenting with bilateral knee pain. Patient's husband unexpectedly died and she has been very busy. Patient had declined both meloxicam and tramadol  offered by Dr. Tamala Julian. Today patient states that her pain has increased over past month. Pain in L knee is radiating up and down leg. Right knee is also painful. Patient has had to take care of herself past month since passing of her husband.    09/08/21 Patient states that the knee injection helped for about a week and then the pain went right back to where is was. Pain behind the knee is worse and it radiates down the back of the leg.    10/20/21 Patient states that her leg is killing her radiates all the way down and behind her knee    11/13/2021 Patient states that she is doing the same,  did have MRI so will discuss results     Relevant Historical Information: Subchondral femoral fracture seen on MRI, recent passing away of her husband.  DM type II.   Additional pertinent review of systems negative.   Current Outpatient Medications:    acetaminophen (TYLENOL) 650 MG CR tablet, Take 650 mg by mouth 2 (two) times daily as needed for pain. For pain, Disp: , Rfl:    aspirin 81 MG tablet, Take 81 mg by mouth daily., Disp: , Rfl:    benazepril (LOTENSIN) 20 MG tablet, TAKE 1 TABLET DAILY, Disp: 90 tablet, Rfl: 0   Blood Glucose Monitoring Suppl (ONE TOUCH ULTRA SYSTEM KIT) W/DEVICE KIT, 1 kit by Does not apply route once., Disp: 1 each, Rfl: 0   clonazePAM (KLONOPIN) 0.5 MG tablet, 1/2-1 po qhs prn, Disp: 20 tablet, Rfl: 1   Continuous Blood Gluc Receiver (FREESTYLE LIBRE 14 DAY READER) DEVI, 1 Device by Does not apply route See admin  instructions. Use for continuous monitor glucose, Disp: 1 each, Rfl: 0   Continuous Blood Gluc Sensor (FREESTYLE LIBRE 14 DAY SENSOR) MISC, Apply new sensor every 14 days for continuous glucose monitoring., Disp: 6 each, Rfl: 2   estradiol (ESTRACE) 1 MG tablet, TAKE 1 TABLET DAILY, Disp: 90 tablet, Rfl: 1   ezetimibe-simvastatin (VYTORIN) 10-40 MG tablet, TAKE 1 TABLET AT BEDTIME, Disp: 90 tablet, Rfl: 1   fenofibrate 160 MG tablet, TAKE 1 TABLET DAILY, Disp:  90 tablet, Rfl: 1   FLUoxetine (PROZAC) 20 MG tablet, Take 3 tablets (60 mg total) by mouth daily., Disp: 270 tablet, Rfl: 3   glucose blood (ONETOUCH ULTRA) test strip, Use to check blood sugar 3 times a day, Disp: 100 each, Rfl: 0   insulin aspart (NOVOLOG FLEXPEN) 100 UNIT/ML FlexPen, Inject 18-24 Units into the skin 3 (three) times daily with meals., Disp: 45 mL, Rfl: 3   Insulin Glargine (BASAGLAR KWIKPEN) 100 UNIT/ML, INJECT 60 UNITS            SUBCUTANEOUSLY AT BEDTIME, Disp: 45 mL, Rfl: 3   Insulin Pen Needle (PEN NEEDLES) 32G X 4 MM MISC, USE AS INSTRUCTED WITH LANCETS., Disp: 400 each, Rfl: 3   meloxicam (MOBIC) 15 MG tablet, Take 1 tablet (15 mg total) by mouth daily., Disp: 30 tablet, Rfl: 0   metFORMIN (GLUCOPHAGE) 850 MG tablet, Take 1 tablet in the AM, and 2 tablets in the PM., Disp: 270 tablet, Rfl: 3   pantoprazole (PROTONIX) 40 MG tablet, TAKE 1 TABLET DAILY, Disp: 90 tablet, Rfl: 3   Semaglutide, 1 MG/DOSE, (OZEMPIC, 1 MG/DOSE,) 4 MG/3ML SOPN, INJECT 1MG SUBCUTANEOUSLY  ONCE A WEEK., Disp: 9 mL, Rfl: 3   Vitamin D, Ergocalciferol, (DRISDOL) 1.25 MG (50000 UNIT) CAPS capsule, Take 1 capsule (50,000 Units total) by mouth every 7 (seven) days., Disp: 12 capsule, Rfl: 0  Current Facility-Administered Medications:    ipratropium-albuterol (DUONEB) 0.5-2.5 (3) MG/3ML nebulizer solution 3 mL, 3 mL, Nebulization, Q6H, Copland, Jessica C, MD, 3 mL at 11/15/16 1330   Objective:     Vitals:   11/13/21 1043  BP: (!) 142/80  Pulse: 92  SpO2: 96%  Height: _0  (1.6 m)      Body mass index is 48.18 kg/m.    Physical Exam:    General:  awake, alert oriented, no acute distress nontoxic Skin: no suspicious lesions or rashes Neuro:sensation intact, no deficits, strength 5/5 with no deficits, no atrophy, normal muscle tone Psych: No signs of anxiety, depression or other mood disorder  Left knee: Moderate bilateral swelling of both legs No deformity   ROM Flex 80, Ext 10 TTP  lateral femoral condyle, lateral joint line, medial joint line, posterior fossa  NTTP over the quad tendon, medial fem condyle,  patella, plica, patella tendon, tibial tuberostiy, fibular head,  pes anserine bursa, gerdy's tubercle,  Neg anterior and posterior drawer Neg lachman Neg sag sign Negative varus stress Negative valgus stress + McMurray   Gait shortened, favoring right leg.  Using 1 pronged cane for ambulation.   Electronically signed by:  Andrea Marks D.[facility] 11:04 AM 11/13/21

## 2021-11-12 NOTE — Patient Instructions (Signed)
Visit Information  Thank you for taking time to visit with me today. Please don't hesitate to contact me if I can be of assistance to you before our next scheduled telephone appointment.  Following are the goals we discussed today:  Patient Goals/Self-Care Activities: Continue to receive personal counseling with LCSW, on a bi-weekly basis, to reduce and manage symptoms of Depression, Anxiety and Grief, until well-controlled.   Continue to try and incorporate into daily practice - relaxation techniques, deep breathing exercises, and mindfulness meditation strategies. Please consider self-enrollment in a grief and loss support group, from the following list provided, e-mailed ([email or web address]) to you, per your request: ~ Grief Recovery Support Groups - Grief Share - The Journey of Grief (Parts I and II) - Lessons of Grief (Parts I and II) - Challenges of Grief - What Do I Live for Now  ~ Widowed Village Grief and Loss Support Groups - Land   - Support for [employer] Widowed - Journalist, newspaper on Widowhood - Access to a Widowed Pen Pal ~ Art therapist Support Groups - Corporate treasurer  - Individual or Group Sessions Facilitated by Paediatric nurse  - The Bereaved Widow ~ Grief Support - Civil engineer, contracting   - Grief Support for Adults  - One-On-One Grief Counseling for Adults - Support Groups for Adults - Workshops for Adults - Grief Resources for Adults Contact LCSW directly (# [phone]) if you have questions, need assistance, or if additional social work needs are identified between now and our next scheduled telephone outreach call.  Follow-Up:  12/10/2021 at 10:45 am  Please call the care guide team at [phone] if you need to cancel or reschedule your appointment.   If you are experiencing a Mental Health or Behavioral Health Crisis or need someone to talk to, please call the Suicide and Crisis Lifeline: 988 call the [place] National  Suicide Prevention Lifeline: 650-048-7994 or TTY: (234)640-8266 TTY 718-162-6337) to talk to a trained counselor call 1-800-273-TALK (toll free, 24 hour hotline) go to [facility] [address] [place] [phone]) call the [facility] Crisis Line: [phone] call 911   Patient verbalizes understanding of instructions and care plan provided today and agrees to view in MyChart. Active MyChart status confirmed with patient.    Danford Bad LCSW Licensed Clinical Social Worker [facility] [employer] [phone]

## 2021-11-13 ENCOUNTER — Ambulatory Visit (INDEPENDENT_AMBULATORY_CARE_PROVIDER_SITE_OTHER): Payer: Medicare Other | Admitting: Sports Medicine

## 2021-11-13 ENCOUNTER — Other Ambulatory Visit: Payer: Self-pay

## 2021-11-13 VITALS — BP 142/80 | HR 92 | Ht 63.0 in

## 2021-11-13 DIAGNOSIS — G8929 Other chronic pain: Secondary | ICD-10-CM

## 2021-11-13 DIAGNOSIS — M84452G Pathological fracture, left femur, subsequent encounter for fracture with delayed healing: Secondary | ICD-10-CM | POA: Diagnosis not present

## 2021-11-13 DIAGNOSIS — M23307 Other meniscus derangements, unspecified meniscus, left knee: Secondary | ICD-10-CM | POA: Diagnosis not present

## 2021-11-13 DIAGNOSIS — M25562 Pain in left knee: Secondary | ICD-10-CM | POA: Diagnosis not present

## 2021-11-13 DIAGNOSIS — M1712 Unilateral primary osteoarthritis, left knee: Secondary | ICD-10-CM

## 2021-11-13 NOTE — Patient Instructions (Signed)
Good to see you Referral to orthopedics As needed follow up

## 2021-11-16 ENCOUNTER — Other Ambulatory Visit: Payer: Self-pay

## 2021-11-16 ENCOUNTER — Encounter: Payer: Self-pay | Admitting: Internal Medicine

## 2021-11-16 ENCOUNTER — Ambulatory Visit (INDEPENDENT_AMBULATORY_CARE_PROVIDER_SITE_OTHER): Payer: Medicare Other | Admitting: Internal Medicine

## 2021-11-16 VITALS — BP 138/76 | HR 82 | Ht 63.0 in | Wt 269.8 lb

## 2021-11-16 DIAGNOSIS — E11319 Type 2 diabetes mellitus with unspecified diabetic retinopathy without macular edema: Secondary | ICD-10-CM

## 2021-11-16 DIAGNOSIS — Z794 Long term (current) use of insulin: Secondary | ICD-10-CM

## 2021-11-16 DIAGNOSIS — E1165 Type 2 diabetes mellitus with hyperglycemia: Secondary | ICD-10-CM | POA: Diagnosis not present

## 2021-11-16 DIAGNOSIS — E785 Hyperlipidemia, unspecified: Secondary | ICD-10-CM | POA: Diagnosis not present

## 2021-11-16 LAB — POCT GLYCOSYLATED HEMOGLOBIN (HGB A1C): Hemoglobin A1C: 5.6 % (ref 4.0–5.6)

## 2021-11-16 MED ORDER — NOVOLOG FLEXPEN 100 UNIT/ML ~~LOC~~ SOPN: 15.0000 [IU] | PEN_INJECTOR | Freq: Three times a day (TID) | SUBCUTANEOUS | 3 refills | Status: DC

## 2021-11-16 MED ORDER — OZEMPIC (1 MG/DOSE) 4 MG/3ML ~~LOC~~ SOPN
PEN_INJECTOR | SUBCUTANEOUS | 3 refills | Status: DC
Start: 2021-11-16 — End: 2022-08-20

## 2021-11-16 MED ORDER — METFORMIN HCL 850 MG PO TABS: ORAL_TABLET | ORAL | 3 refills | Status: DC

## 2021-11-16 MED ORDER — BASAGLAR KWIKPEN 100 UNIT/ML ~~LOC~~ SOPN
PEN_INJECTOR | SUBCUTANEOUS | 3 refills | Status: DC
Start: 2021-11-16 — End: 2022-03-18

## 2021-11-16 NOTE — Patient Instructions (Addendum)
Please continue: - Metformin 850 mg in am and 1700 mg at night - Ozempic 1 mg weekly  Decrease: - Basaglar 50 units at bedtime  Continue:  - NovoLog 15-18 3x a day before meals Use only 5-8 units before a snack  Please return in 4 months.

## 2021-11-16 NOTE — Progress Notes (Signed)
Patient ID: Andrea Marks, female   DOB: Aug 23, 1951, 71 y.o.   MRN: [id number]  This visit occurred during the SARS-CoV-2 public health emergency.  Safety protocols were in place, including screening questions prior to the visit, additional usage of staff PPE, and extensive cleaning of exam room while observing appropriate contact time as indicated for disinfecting solutions.   HPI: Andrea Marks is a 71 y.o.-year-old female, returning for f/u for DM2, dx 1994, insulin-dependent, uncontrolled, with complications (diabetic retinopathy). Last visit was 4 months ago.  Interim history: Her husband died 28-Aug-2021 after a very short illness.  They were married for 50 years. She does not have family in the area but cannot move to [place] 2/2 cost of living. No increased urination, nausea, chest pain. She has R knee pain - 2 fractures - may need sx. it is hard for her to go up and down stairs and get to to from appointments.  She has a next to her neighbor that helps her.  Reviewed HbA1c levels: Lab Results  Component Value Date   HGBA1C 5.9 (H) 02/26/2021   HGBA1C 6.0 (A) 12/18/2020   HGBA1C 5.9 (A) 08/14/2020   She is on: - Metformin 850 mg in am and  1700 mg at night - Ozempic 1 mg weekly - Basaglar 60 units at bedtime >> Basaglar 70 >> 60 units at bedtime - NovoLog 3x a day before meals 15-18 units before meals Tried Bydureon once a week >> she had problems injecting the medication.  She also tried Victoza. She was on Humalog in the past. She was on Amaryl in the past, which we stopped going to increase her insulin doses 05/2018. She was on a Vgo pump, which we stopped in 2019 as she needed higher doses of insulin.  She checks her sugars more than 4 times a day with her freestyle libre 2 CGM (she shut down the alarms):   Previously:   Previously:  Lowest blood sugars: 50s >> 70s >> 50s. Highest blood sugars: 300s >> 300s (steroids) >> 200s.  Meals: - Breakfast:  egg + bagel + yoghurt - Lunch: may skip, salad - Dinner: salad, hamburger/hot dog, tuna fish - Snacks: potato chips, pretzel No sodas, only drinks sparkling water.  No CKD, last BUN/creatinine was:  Lab Results  Component Value Date   BUN 16 03/19/2021   CREATININE 0.79 03/19/2021  On benazepril.  + HL; Last set of lipids: Lab Results  Component Value Date   CHOL 98 01/01/2021   HDL 48.70 01/01/2021   LDLCALC 20 01/01/2021   LDLDIRECT 64.0 04/21/2017   TRIG 147.0 01/01/2021   CHOLHDL 2 01/01/2021  On Vytorin and fenofibrate.  - Last eye exam: 2022: No DR reportedly, but she did have DR in the past. + macular edema OS. She had cataract surgery. She was admitted with blurry vision, papilledema in 02/26/2021.  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 She saw a retina specialist and a neurologist.  She was on steroids. New exam coming up 12/2021.  - + numbness and tingling in her feet.  She sees podiatry-Dr. Prudence Davidson.  She had an ulcer in lower right leg (has lymphedema) >> healed. She also has a history of HTN, GERD, depression, h/o thrombophlebitis. Previously on B12 injections now on 1000 mcg p.o. B12 daily.  Her husband has lung cancer.  ROS: + see HPI  I reviewed pt's medications, allergies, PMH, social hx, family hx, and changes were documented in the history of present  illness. Otherwise, unchanged from my initial visit note.   Past Medical History:  Diagnosis Date   Depression    Diabetes mellitus    GERD (gastroesophageal reflux disease)    Hyperlipidemia    Hypertension    Obesity    Past Surgical History:  Procedure Laterality Date   ABDOMINAL HYSTERECTOMY  1991   BSO   Social History   Socioeconomic History   Marital status: Widowed    Spouse name: Not on file   Number of children: 0   Years of education: 4   Highest education level: Some college, no degree  Occupational History   Occupation: retired from Economist: RETIRED  Tobacco Use   Smoking status: Former    Types: Cigarettes    Quit date: 1995    Years since quitting: 28.0    Passive exposure: Past   Smokeless tobacco: Never  Vaping Use   Vaping Use: Never used  Substance and Sexual Activity   Alcohol use: No   Drug use: No   Sexual activity: Not Currently    Partners: Male  Other Topics Concern   Not on file  Social History Narrative   Lives with husband   Right Handed   Drinks 3-5 cups caffeine daily   Social Determinants of Health   Financial Resource Strain: Low Risk    Difficulty of Paying Living Expenses: Not hard at all  Food Insecurity: No Food Insecurity   Worried About Charity fundraiser in the Last Year: Never true   Arboriculturist in the Last Year: Never true  Transportation Needs: No Transportation Needs   Lack of Transportation (Medical): No   Lack of Transportation (Non-Medical): No  Physical Activity: Inactive   Days of Exercise per Week: 0 days   Minutes of Exercise per Session: 0 min  Stress: Stress Concern Present   Feeling of Stress : Very much  Social Connections: Socially Isolated   Frequency of Communication with Friends and Family: More than three times a week   Frequency of Social Gatherings with Friends and Family: More than three times a week   Attends Religious Services: Never   Marine scientist or Organizations: No   Attends Archivist Meetings: Never   Marital Status: Widowed  Human resources officer Violence: Not At Risk   Fear of Current or Ex-Partner: No   Emotionally Abused: No   Physically Abused: No   Sexually Abused: No   Current Outpatient Medications on File Prior to Visit  Medication Sig Dispense Refill   acetaminophen (TYLENOL) 650 MG CR tablet Take 650 mg by mouth 2 (two) times daily as needed for pain. For pain     aspirin 81 MG tablet Take 81 mg by mouth daily.     benazepril (LOTENSIN) 20 MG tablet TAKE 1 TABLET DAILY 90 tablet 0   Blood Glucose  Monitoring Suppl (ONE TOUCH ULTRA SYSTEM KIT) W/DEVICE KIT 1 kit by Does not apply route once. 1 each 0   clonazePAM (KLONOPIN) 0.5 MG tablet 1/2-1 po qhs prn 20 tablet 1   Continuous Blood Gluc Receiver (FREESTYLE LIBRE 14 DAY READER) DEVI 1 Device by Does not apply route See admin instructions. Use for continuous monitor glucose 1 each 0   Continuous Blood Gluc Sensor (FREESTYLE LIBRE 14 DAY SENSOR) MISC Apply new sensor every 14 days for continuous glucose monitoring. 6 each 2   estradiol (ESTRACE) 1 MG tablet TAKE 1 TABLET DAILY  90 tablet 1   ezetimibe-simvastatin (VYTORIN) 10-40 MG tablet TAKE 1 TABLET AT BEDTIME 90 tablet 1   fenofibrate 160 MG tablet TAKE 1 TABLET DAILY 90 tablet 1   FLUoxetine (PROZAC) 20 MG tablet Take 3 tablets (60 mg total) by mouth daily. 270 tablet 3   glucose blood (ONETOUCH ULTRA) test strip Use to check blood sugar 3 times a day 100 each 0   insulin aspart (NOVOLOG FLEXPEN) 100 UNIT/ML FlexPen Inject 18-24 Units into the skin 3 (three) times daily with meals. 45 mL 3   Insulin Glargine (BASAGLAR KWIKPEN) 100 UNIT/ML INJECT 60 UNITS            SUBCUTANEOUSLY AT BEDTIME 45 mL 3   Insulin Pen Needle (PEN NEEDLES) 32G X 4 MM MISC USE AS INSTRUCTED WITH LANCETS. 400 each 3   meloxicam (MOBIC) 15 MG tablet Take 1 tablet (15 mg total) by mouth daily. 30 tablet 0   metFORMIN (GLUCOPHAGE) 850 MG tablet Take 1 tablet in the AM, and 2 tablets in the PM. 270 tablet 3   pantoprazole (PROTONIX) 40 MG tablet TAKE 1 TABLET DAILY 90 tablet 3   Semaglutide, 1 MG/DOSE, (OZEMPIC, 1 MG/DOSE,) 4 MG/3ML SOPN INJECT $RemoveBef'1MG'tVxqivSUru$  SUBCUTANEOUSLY  ONCE A WEEK. 9 mL 3   Vitamin D, Ergocalciferol, (DRISDOL) 1.25 MG (50000 UNIT) CAPS capsule Take 1 capsule (50,000 Units total) by mouth every 7 (seven) days. 12 capsule 0   Current Facility-Administered Medications on File Prior to Visit  Medication Dose Route Frequency Provider Last Rate Last Admin   ipratropium-albuterol (DUONEB) 0.5-2.5 (3) MG/3ML  nebulizer solution 3 mL  3 mL Nebulization Q6H Copland, Jessica C, MD   3 mL at 11/15/16 1330   Allergies  Allergen Reactions   Hydrocodone-Acetaminophen Other (See Comments)    unknown   Oxycodone Hcl Other (See Comments)    unknown   Penicillins Other (See Comments)    unknown   Prednisone     Patient has noted allergy to prednisone but she says it was a questionable history of mild nausea with prednisone and nothing more    Family History  Problem Relation Age of Onset   Macular degeneration Mother    Heart disease Mother        syncope   Aortic stenosis Mother    Lung disease Father 49       mesothelioma   Cancer Maternal Aunt        stomach   Heart disease Paternal Uncle        cabg   Diabetes Paternal Uncle    Heart disease Paternal Uncle    Diabetes Paternal Uncle    Diabetes Paternal Uncle    Heart disease Paternal Uncle    Heart disease Paternal Uncle    Heart disease Paternal Uncle    Lung cancer Other        asbestos   Diabetes Paternal Grandmother    Cancer Other        lung   PE: BP 138/76    Pulse 82    Ht $R'5\' 3"'wE$  (1.6 m)    Wt 269 lb 12.8 oz (122.4 kg)    SpO2 97%    BMI 47.79 kg/m  Wt Readings from Last 3 Encounters:  11/16/21 269 lb 12.8 oz (122.4 kg)  10/20/21 272 lb (123.4 kg)  09/29/21 268 lb 12.8 oz (121.9 kg)   Constitutional: overweight, in NAD Eyes: PERRLA, EOMI, no exophthalmos ENT: moist mucous membranes, no thyromegaly, no cervical lymphadenopathy  Cardiovascular: RRR, No MRG, + B LE edema Respiratory: CTA B Musculoskeletal: no deformities, strength intact in all 4 Skin: moist, warm, no rashes except stasis dermatitis of bilateral lower legs Neurological: no tremor with outstretched hands, DTR normal in all 4  ASSESSMENT: 1. DM2, insulin-dependent, uncontrolled, with complications - DR  2. Obesity class 3  3. HL  PLAN:  1. Patient with history of uncontrolled type 2 diabetes, insulin-dependent, with worse control in 2020, when  she returned after an absence of 1.5 years.  At that time, she had a lot of stress in her life and also relaxed her diet and sugars were in the 200s to 400s.  We discussed about improving diet, and increase her insulin and GLP-1 receptor agonist.  Sugars started to improve afterwards on a basal-bolus insulin regimen, which she continues today.  At last visit, HbA1c was excellent, at 5.9%.  Sugars were excellent overnight and they were increasing after meals.  We did not change her regimen at that time. CGM interpretation: -At today's visit, we reviewed her CGM downloads: It appears that 84% of values are in target range (goal >70%), while 9% are higher than 180 (goal <25%), and 7% are lower than 70 (goal <4%).  The calculated average blood sugar is 113.  The projected HbA1c for the next 3 months (GMI) is 6.0%. -Reviewing the CGM trends, sugars are now dropping too low overnight, most being close to 70 and occasionally dropping into the 50s and 60s.  Therefore, will back off her Basaglar dose to 50 units at bedtime. I advised her to let me know if she has any more lows at night.  Also, sugars could be lower before dinner and occasionally after dinner and upon questioning, she takes a high dose of NovoLog even if she has a snack. Advised her to only take 5-8 units for these and maintain 15-18 units before meals, but try to stay closer to the lower dose limit.  For now, we will continue metformin and Ozempic at the current doses.  She is tolerating them well. - I advised her to:  Patient Instructions  Please continue: - Metformin 850 mg in am and 1700 mg at night - Ozempic 1 mg weekly  Decrease: - Basaglar 50 units at bedtime  Continue:  - NovoLog 15-18 3x a day before meals Use only 5-8 units before a snack  Please return in 4 months.  - we checked her HbA1c: 5.6% (lower) - advised to check sugars at different times of the day - 4x a day, rotating check times - advised for yearly eye exams >> she  is UTD - return to clinic in 4 months  2. Obesity class 3  -We will continue her weekly GLP-1 receptor agonist, Ozempic 1 mg weekly, which should also help with weight loss -She lost 6 pounds since last visit  3. HL -Reviewed latest lipid panel from 12/2020: Excellent Lab Results  Component Value Date   CHOL 98 01/01/2021   HDL 48.70 01/01/2021   LDLCALC 20 01/01/2021   LDLDIRECT 64.0 04/21/2017   TRIG 147.0 01/01/2021   CHOLHDL 2 01/01/2021  -She continues on simvastatin ezetimibe 40-10 mg daily and also fenofibrate 160 mg daily without side effects  Philemon Kingdom, MD PhD [facility]

## 2021-11-17 ENCOUNTER — Other Ambulatory Visit: Payer: Self-pay | Admitting: Family Medicine

## 2021-11-17 ENCOUNTER — Encounter: Payer: Self-pay | Admitting: Family Medicine

## 2021-11-17 DIAGNOSIS — I10 Essential (primary) hypertension: Secondary | ICD-10-CM

## 2021-11-17 DIAGNOSIS — E785 Hyperlipidemia, unspecified: Secondary | ICD-10-CM

## 2021-11-17 MED ORDER — EZETIMIBE-SIMVASTATIN 10-40 MG PO TABS: 1.0000 | ORAL_TABLET | Freq: Every day | ORAL | 1 refills | Status: DC

## 2021-12-01 DIAGNOSIS — E785 Hyperlipidemia, unspecified: Secondary | ICD-10-CM

## 2021-12-01 DIAGNOSIS — Z794 Long term (current) use of insulin: Secondary | ICD-10-CM

## 2021-12-01 DIAGNOSIS — E1169 Type 2 diabetes mellitus with other specified complication: Secondary | ICD-10-CM

## 2021-12-01 DIAGNOSIS — M170 Bilateral primary osteoarthritis of knee: Secondary | ICD-10-CM

## 2021-12-01 DIAGNOSIS — F321 Major depressive disorder, single episode, moderate: Secondary | ICD-10-CM

## 2021-12-01 DIAGNOSIS — I10 Essential (primary) hypertension: Secondary | ICD-10-CM

## 2021-12-01 DIAGNOSIS — M159 Polyosteoarthritis, unspecified: Secondary | ICD-10-CM

## 2021-12-01 DIAGNOSIS — E1165 Type 2 diabetes mellitus with hyperglycemia: Secondary | ICD-10-CM

## 2021-12-01 DIAGNOSIS — M1711 Unilateral primary osteoarthritis, right knee: Secondary | ICD-10-CM

## 2021-12-01 DIAGNOSIS — F418 Other specified anxiety disorders: Secondary | ICD-10-CM

## 2021-12-03 ENCOUNTER — Other Ambulatory Visit: Payer: Self-pay | Admitting: Orthopaedic Surgery

## 2021-12-03 DIAGNOSIS — M25562 Pain in left knee: Secondary | ICD-10-CM

## 2021-12-06 ENCOUNTER — Other Ambulatory Visit: Payer: Self-pay | Admitting: Family Medicine

## 2021-12-06 DIAGNOSIS — E785 Hyperlipidemia, unspecified: Secondary | ICD-10-CM

## 2021-12-09 ENCOUNTER — Ambulatory Visit: Payer: Medicare Other | Admitting: Podiatry

## 2021-12-09 ENCOUNTER — Encounter: Payer: Self-pay | Admitting: Internal Medicine

## 2021-12-10 ENCOUNTER — Ambulatory Visit (INDEPENDENT_AMBULATORY_CARE_PROVIDER_SITE_OTHER): Payer: Medicare Other | Admitting: *Deleted

## 2021-12-10 DIAGNOSIS — F4321 Adjustment disorder with depressed mood: Secondary | ICD-10-CM

## 2021-12-10 DIAGNOSIS — R5383 Other fatigue: Secondary | ICD-10-CM

## 2021-12-10 DIAGNOSIS — R519 Headache, unspecified: Secondary | ICD-10-CM

## 2021-12-10 DIAGNOSIS — M170 Bilateral primary osteoarthritis of knee: Secondary | ICD-10-CM

## 2021-12-10 DIAGNOSIS — E785 Hyperlipidemia, unspecified: Secondary | ICD-10-CM

## 2021-12-10 DIAGNOSIS — Z794 Long term (current) use of insulin: Secondary | ICD-10-CM

## 2021-12-10 DIAGNOSIS — F418 Other specified anxiety disorders: Secondary | ICD-10-CM

## 2021-12-10 DIAGNOSIS — I10 Essential (primary) hypertension: Secondary | ICD-10-CM

## 2021-12-10 DIAGNOSIS — E1165 Type 2 diabetes mellitus with hyperglycemia: Secondary | ICD-10-CM

## 2021-12-10 DIAGNOSIS — H4710 Unspecified papilledema: Secondary | ICD-10-CM

## 2021-12-10 DIAGNOSIS — F321 Major depressive disorder, single episode, moderate: Secondary | ICD-10-CM

## 2021-12-10 DIAGNOSIS — M1711 Unilateral primary osteoarthritis, right knee: Secondary | ICD-10-CM

## 2021-12-10 NOTE — Patient Instructions (Signed)
Visit Information  Thank you for taking time to visit with me today. Please don't hesitate to contact me if I can be of assistance to you before our next scheduled telephone appointment.  Following are the goals we discussed today:  Patient Goals/Self-Care Activities: Continue to receive personal counseling with LCSW, on a bi-weekly basis, to reduce and manage symptoms of Depression, Anxiety and Grief, until well-controlled.   Continue to incorporate into daily practice - relaxation techniques, deep breathing exercises, and mindfulness meditation strategies. Please consider self-enrollment in a grief and loss support group, from the list provided: ~ Grief Recovery Support Groups - Grief Share ~ Widowed Village Grief and Loss Support Groups - Land ~ Art therapist Support Groups - Corporate treasurer ~ Grief Support - Theatre stage manager of handrail installment on front porch, to improve safety and mobility, through Merchandiser, retail. Continue to utilize friends and neighbors to assist with obtaining mail, groceries, and garbage pick-up. Attend initial consult with Dr. Karlton Lemon, Orthopedic Surgeon with [facility], to discuss [facility] Treatment, scheduled on 12/11/2021 at 10:30 am. Review resource information and brochures provided, while considering purchasing a power stair lift, as well as installation of a walk-in, handicapped accessible shower.   Contact LCSW directly (# [phone]), if you have questions, need assistance, or if additional social work needs are identified in the near future.    Follow-Up Date:  12/21/2021 at 9:15 am  Please call the care guide team at [phone] if you need to cancel or reschedule your appointment.   If you are experiencing a Mental Health or Behavioral Health Crisis or need someone to talk to, please call the Suicide and Crisis Lifeline: 988 call the [place]  National Suicide Prevention Lifeline: (585) 226-1174 or TTY: 564-189-2396 TTY 304 200 3591) to talk to a trained counselor call 1-800-273-TALK (toll free, 24 hour hotline) go to [facility] [address], [place] [phone]) call the [facility] Crisis Line: [phone] call 911   Patient verbalizes understanding of instructions and care plan provided today and agrees to view in MyChart. Active MyChart status confirmed with patient.    Danford Bad LCSW Licensed Clinical Social Worker [facility] [employer] [phone]

## 2021-12-10 NOTE — Chronic Care Management (AMB) (Signed)
Chronic Care Management    Clinical Social Work Note  12/10/2021 Name: Andrea Marks MRN: [id number] DOB: 1951/08/08  Andrea Marks is a 71 y.o. year old female who is a primary care patient of [name], DO. The CCM team was consulted to assist the patient with chronic disease management and/or care coordination needs related to: [employer], Mental Health Counseling and Resources, and Grief Counseling.   Engaged with patient by telephone for follow up visit in response to provider referral for social work chronic care management and care coordination services.   Consent to Services:  The patient was given information about Chronic Care Management services, agreed to services, and gave verbal consent prior to initiation of services.  Please see initial visit note for detailed documentation.   Patient agreed to services and consent obtained.   Assessment: Review of patient past medical history, allergies, medications, and health status, including review of relevant consultants reports was performed today as part of a comprehensive evaluation and provision of chronic care management and care coordination services.     SDOH (Social Determinants of Health) assessments and interventions performed:    Advanced Directives Status: Not addressed in this encounter.  CCM Care Plan  Allergies  Allergen Reactions   Hydrocodone-Acetaminophen Other (See Comments)    unknown   Oxycodone Hcl Other (See Comments)    unknown   Penicillins Other (See Comments)    unknown   Prednisone     Patient has noted allergy to prednisone but she says it was a questionable history of mild nausea with prednisone and nothing more     Outpatient Encounter Medications as of 12/10/2021  Medication Sig   fenofibrate 160 MG tablet TAKE 1 TABLET DAILY   acetaminophen (TYLENOL) 650 MG CR tablet Take 650 mg by mouth 2 (two) times daily as needed for pain. For pain   aspirin  81 MG tablet Take 81 mg by mouth daily.   benazepril (LOTENSIN) 20 MG tablet TAKE 1 TABLET DAILY   Blood Glucose Monitoring Suppl (ONE TOUCH ULTRA SYSTEM KIT) W/DEVICE KIT 1 kit by Does not apply route once.   clonazePAM (KLONOPIN) 0.5 MG tablet 1/2-1 po qhs prn   Continuous Blood Gluc Receiver (FREESTYLE LIBRE 14 DAY READER) DEVI 1 Device by Does not apply route See admin instructions. Use for continuous monitor glucose   Continuous Blood Gluc Sensor (FREESTYLE LIBRE 14 DAY SENSOR) MISC Apply new sensor every 14 days for continuous glucose monitoring.   estradiol (ESTRACE) 1 MG tablet TAKE 1 TABLET DAILY   ezetimibe-simvastatin (VYTORIN) 10-40 MG tablet Take 1 tablet by mouth at bedtime.   FLUoxetine (PROZAC) 20 MG tablet Take 3 tablets (60 mg total) by mouth daily.   glucose blood (ONETOUCH ULTRA) test strip Use to check blood sugar 3 times a day   insulin aspart (NOVOLOG FLEXPEN) 100 UNIT/ML FlexPen Inject 15-18 Units into the skin 3 (three) times daily with meals.   Insulin Glargine (BASAGLAR KWIKPEN) 100 UNIT/ML INJECT 50 UNITS SUBCUTANEOUSLY AT BEDTIME   Insulin Pen Needle (PEN NEEDLES) 32G X 4 MM MISC USE AS INSTRUCTED WITH LANCETS.   meloxicam (MOBIC) 15 MG tablet Take 1 tablet (15 mg total) by mouth daily.   metFORMIN (GLUCOPHAGE) 850 MG tablet Take 1 tablet in the AM, and 2 tablets in the PM.   pantoprazole (PROTONIX) 40 MG tablet TAKE 1 TABLET DAILY   Semaglutide, 1 MG/DOSE, (OZEMPIC, 1 MG/DOSE,) 4 MG/3ML SOPN INJECT 1MG SUBCUTANEOUSLY  ONCE A  WEEK.   Vitamin D, Ergocalciferol, (DRISDOL) 1.25 MG (50000 UNIT) CAPS capsule Take 1 capsule (50,000 Units total) by mouth every 7 (seven) days.   Facility-Administered Encounter Medications as of 12/10/2021  Medication   ipratropium-albuterol (DUONEB) 0.5-2.5 (3) MG/3ML nebulizer solution 3 mL    Patient Active Problem List   Diagnosis Date Noted   Depression with anxiety 10/01/2021   Insomnia 10/01/2021   Vision loss of right eye     Nonintractable headache    Papilledema 02/26/2021   Degenerative arthritis of knee, bilateral 10/30/2020   Degenerative arthritis of right knee 12/25/2018   Fatigue 12/15/2018   Hyperlipidemia associated with type 2 diabetes mellitus ([place]) 12/15/2018   B12 deficiency 12/15/2018   AC (acromioclavicular) joint arthritis 06/20/2018   Left rotator cuff tear 05/29/2018   Hyperlipidemia LDL goal <70 12/19/2017   Morbid obesity ([place]) 11/15/2016   Type 2 diabetes mellitus with hyperglycemia, with long-term current use of insulin ([place]) 10/23/2015   Post-traumatic wound infection 04/17/2013   Thrombophlebitis leg 02/23/2011   TINEA CORPORIS 12/30/2008   Depression, major, single episode, moderate ([place]) 12/14/2006   Essential hypertension 12/14/2006   GERD 12/14/2006    Conditions to be addressed/monitored: Anxiety, Depression, and Grief.  Limited Social Support, Mental Health Concerns, and Lacks Knowledge of [employer].    Care Plan : LCSW Plan of Care  Updates made by Francis Gaines, LCSW since 12/10/2021 12:00 AM     Problem: Reduce and Manage My Symptoms of Depression.   Priority: High     Long-Range Goal: Reduce and Manage My Symptoms of Depression.   Start Date: 10/13/2021  Expected End Date: 02/11/2022  This Visit's Progress: On track  Recent Progress: On track  Priority: High  Note:   Current Barriers:   Acute Mental Health Needs related to Degenerative Arthritis of Bilateral Knees, Right Eye Vision Loss, Morbid Obesity, Major Depression, Anxiety and Grief, requires Support, Education, Resources, Referrals, Advocacy and Care Coordination in order to meet unmet mental health needs. Clinical Goal(s):  Patient will work with LCSW to reduce and manage symptoms of Depression, Anxiety and Grief, until well-controlled.     Patient will increase knowledge and/or ability of:        Coping Skills, Healthy Habits, Self-Management Skills, Stress Reduction, Home Safety and  Utilizing [employer] and Resources.   Clinical Interventions:  Solution-Focused Therapy performed, Verbalization of Feelings encouraged, Emotional Support provided, Problem Solving Solutions developed, Brief Cognitive Behavioral Therapy initiated, Quality of Sleep assessed and Sleep Hygiene Techniques promoted, Grief and Loss Support Group Participation encouraged, Increase Level of Activity/Exercise emphasized.    Collaboration with Primary Care Physician, Dr. Roma Schanz regarding development and update of comprehensive plan of care as evidenced by provider attestation and co-signature. Inter-disciplinary care team collaboration (see longitudinal plan of care). Patient Goals/Self-Care Activities: Continue to receive personal counseling with LCSW, on a bi-weekly basis, to reduce and manage symptoms of Depression, Anxiety and Grief, until well-controlled.   Continue to incorporate into daily practice - relaxation techniques, deep breathing exercises, and mindfulness meditation strategies. Please consider self-enrollment in a grief and loss support group, from the list provided: ~ Grief Recovery Support Groups - Grief Share ~ [place] and Loss [employer] ~ [place] ~ [place] construction of handrail installment on front porch, to improve safety and mobility, through Social worker. Continue to utilize friends and neighbors to  assist with obtaining mail, groceries, and garbage pick-up. Attend initial consult with Dr. Byrd Hesselbach, Orthopedic Surgeon with [place] [facility], to discuss [place], scheduled on 12/11/2021 at 10:30 am. Review resource information and brochures provided, while considering purchasing a power stair lift, as well as installation of a walk-in, handicapped accessible shower.   Contact LCSW  directly (# [phone]), if you have questions, need assistance, or if additional social work needs are identified in the near future.    Follow-Up Date:  12/21/2021 at 9:15 am  Nat Christen Oliver Clinical Social Worker [place] [phone]

## 2021-12-11 ENCOUNTER — Encounter: Payer: Self-pay | Admitting: *Deleted

## 2021-12-11 ENCOUNTER — Other Ambulatory Visit: Payer: Self-pay

## 2021-12-11 ENCOUNTER — Ambulatory Visit
Admission: RE | Admit: 2021-12-11 | Discharge: 2021-12-11 | Disposition: A | Discharge status: Home / Self Care | Payer: Medicare Other | Source: Ambulatory Visit | Attending: Orthopaedic Surgery | Admitting: Orthopaedic Surgery

## 2021-12-11 DIAGNOSIS — M25562 Pain in left knee: Secondary | ICD-10-CM

## 2021-12-11 HISTORY — PX: IR RADIOLOGIST EVAL & MGMT: IMG5224

## 2021-12-11 NOTE — Consult Note (Signed)
Chief Complaint: Left knee pain  Referring Physician(s): Dalldorf,Peter  History of Present Illness: Andrea Marks is a 71 y.o. female with past medical history significant for diabetes, hypertension and hyperlipidemia who has been referred by Dr. Rhona Raider for consideration of IOVERA therapy treatment for left knee pain  Patient has a long history of left-sided knee pain.  Previously her knee pain was adequately managed with cortisone injection and gel shots however recently these interventions have only resulted in approximately 2 to 3 days of pain relief.  Patient states that her knee pain has recently significantly worsened though she denies an inciting injury.  Patient lives alone and ambulates around her home with a rolling walker.  She has difficulty navigating stairs.  Patient states she is both not a candidate for a knee replacement given her weight though additionally is hesitant to undergo any surgery.  Patient presently rates her knee pain as the following: - 6-7/10 at rest - 10/10 with activity  The patient does report mild right-sided knee pain but states this is not presently lifestyle limiting.     Past Medical History:  Diagnosis Date   Depression    Diabetes mellitus    GERD (gastroesophageal reflux disease)    Hyperlipidemia    Hypertension    Obesity     Past Surgical History:  Procedure Laterality Date   ABDOMINAL HYSTERECTOMY  1991   BSO    Allergies: Hydrocodone-acetaminophen, Oxycodone hcl, Penicillins, and Prednisone  Medications: Prior to Admission medications   Medication Sig Start Date End Date Taking? Authorizing Provider  fenofibrate 160 MG tablet TAKE 1 TABLET DAILY 12/07/21   Carollee Herter, Alferd Apa, DO  acetaminophen (TYLENOL) 650 MG CR tablet Take 650 mg by mouth 2 (two) times daily as needed for pain. For pain    [provider]  aspirin 81 MG tablet Take 81 mg by mouth daily.    [provider]   benazepril (LOTENSIN) 20 MG tablet TAKE 1 TABLET DAILY 11/17/21   Carollee Herter, Alferd Apa, DO  Blood Glucose Monitoring Suppl (ONE TOUCH ULTRA SYSTEM KIT) W/DEVICE KIT 1 kit by Does not apply route once. 04/06/13   Philemon Kingdom, MD  clonazePAM (KLONOPIN) 0.5 MG tablet 1/2-1 po qhs prn 09/29/21   Carollee Herter, Alferd Apa, DO  Continuous Blood Gluc Receiver (FREESTYLE LIBRE 14 DAY READER) DEVI 1 Device by Does not apply route See admin instructions. Use for continuous monitor glucose 11/21/19   Philemon Kingdom, MD  Continuous Blood Gluc Sensor (FREESTYLE LIBRE 14 DAY SENSOR) MISC Apply new sensor every 14 days for continuous glucose monitoring. 04/10/21   Philemon Kingdom, MD  estradiol (ESTRACE) 1 MG tablet TAKE 1 TABLET DAILY 10/01/21   Carollee Herter, Alferd Apa, DO  ezetimibe-simvastatin (VYTORIN) 10-40 MG tablet Take 1 tablet by mouth at bedtime. 11/17/21   [name], DO  FLUoxetine (PROZAC) 20 MG tablet Take 3 tablets (60 mg total) by mouth daily. 03/19/21   Roma Schanz R, DO  glucose blood (ONETOUCH ULTRA) test strip Use to check blood sugar 3 times a day 11/08/19   Philemon Kingdom, MD  insulin aspart (NOVOLOG FLEXPEN) 100 UNIT/ML FlexPen Inject 15-18 Units into the skin 3 (three) times daily with meals. 11/16/21   Philemon Kingdom, MD  Insulin Glargine [facility]) 100 UNIT/ML INJECT 50 UNITS SUBCUTANEOUSLY AT BEDTIME 11/16/21   Philemon Kingdom, MD  Insulin Pen Needle (PEN NEEDLES) 32G X 4 MM MISC USE AS INSTRUCTED WITH LANCETS. 12/10/20  Philemon Kingdom, MD  meloxicam (MOBIC) 15 MG tablet Take 1 tablet (15 mg total) by mouth daily. 08/14/21   Lyndal Pulley, DO  metFORMIN (GLUCOPHAGE) 850 MG tablet Take 1 tablet in the AM, and 2 tablets in the PM. 11/16/21   Philemon Kingdom, MD  pantoprazole (PROTONIX) 40 MG tablet TAKE 1 TABLET DAILY 03/31/21   Carollee Herter, Alferd Apa, DO  Semaglutide, 1 MG/DOSE, (OZEMPIC, 1 MG/DOSE,) 4 MG/3ML SOPN INJECT $RemoveBef'1MG'[id number]$  SUBCUTANEOUSLY  ONCE A WEEK.  11/16/21   Philemon Kingdom, MD  Vitamin D, Ergocalciferol, (DRISDOL) 1.25 MG (50000 UNIT) CAPS capsule Take 1 capsule (50,000 Units total) by mouth every 7 (seven) days. 06/30/21   Lyndal Pulley, DO     Family History  Problem Relation Age of Onset   Macular degeneration Mother    Heart disease Mother        syncope   Aortic stenosis Mother    Lung disease Father 54       mesothelioma   Cancer Maternal Aunt        stomach   Heart disease Paternal Uncle        cabg   Diabetes Paternal Uncle    Heart disease Paternal Uncle    Diabetes Paternal Uncle    Diabetes Paternal Uncle    Heart disease Paternal Uncle    Heart disease Paternal Uncle    Heart disease Paternal Uncle    Lung cancer Other        asbestos   Diabetes Paternal Grandmother    Cancer Other        lung    Social History   Socioeconomic History   Marital status: Widowed    Spouse name: Not on file   Number of children: 0   Years of education: 51   Highest education level: Some college, no degree  Occupational History   Occupation: retired from [employer], [employer]: RETIRED  Tobacco Use   Smoking status: Former    Types: Cigarettes    Quit date: 1995    Years since quitting: 28.1    Passive exposure: Past   Smokeless tobacco: Never  Vaping Use   Vaping Use: Never used  Substance and Sexual Activity   Alcohol use: No   Drug use: No   Sexual activity: Not Currently    Partners: Male  Other Topics Concern   Not on file  Social History Narrative   Lives with husband   Right Handed   Drinks 3-5 cups caffeine daily   Social Determinants of Health   Financial Resource Strain: Low Risk    Difficulty of Paying Living Expenses: Not hard at all  Food Insecurity: No Food Insecurity   Worried About Charity fundraiser in the Last Year: Never true   Arboriculturist in the Last Year: Never true  Transportation Needs: No Transportation Needs   Lack of Transportation (Medical): No   Lack of  Transportation (Non-Medical): No  Physical Activity: Inactive   Days of Exercise per Week: 0 days   Minutes of Exercise per Session: 0 min  Stress: Stress Concern Present   Feeling of Stress : Very much  Social Connections: Socially Isolated   Frequency of Communication with Friends and Family: More than three times a week   Frequency of Social Gatherings with Friends and Family: More than three times a week   Attends Religious Services: Never   Marine scientist or Organizations: No  Attends Archivist Meetings: Never   Marital Status: Widowed    ECOG Status: 2 - Symptomatic, <50% confined to bed  Review of Systems  Review of Systems: A 12 point ROS discussed and pertinent positives are indicated in the HPI above.  All other systems are negative.  Physical Exam No direct physical exam was performed (except for noted visual exam findings with Video Visits).   Vital Signs: There were no vitals taken for this visit.  Imaging: Left knee MRI - 11/10/2021; left knee radiographs - 08/25/2021  Labs:  CBC: Recent Labs    03/02/21 0351 03/03/21 0437 03/04/21 0025 03/19/21 1131  WBC 8.2 8.4 7.5 9.9  HGB 12.9 12.9 13.1 14.0  HCT 40.5 40.6 42.2 41.9  PLT 196 196 207 172.0    COAGS: No results for input(s): INR, APTT in the last 8760 hours.  BMP: Recent Labs    03/01/21 0052 03/02/21 0351 03/03/21 0437 03/04/21 0025 03/19/21 1131  NA 138 139 138 140 140  K 4.9 4.8 4.5 4.7 4.7  CL 102 102 101 102 103  CO2 $Re'26 26 25 30 30  'giv$ GLUCOSE 263* 264* 262* 135* 138*  BUN 17 24* 31* 33* 16  CALCIUM 9.1 9.2 8.8* 9.0 9.2  CREATININE 0.98 0.98 1.03* 1.06* 0.79  GFRNONAA >60 >60 58* 57*  --     LIVER FUNCTION TESTS: Recent Labs    01/01/21 1030  BILITOT 0.4  AST 13  ALT 12  ALKPHOS 26*  PROT 6.0  ALBUMIN 3.7    TUMOR MARKERS: No results for input(s): AFPTM, CEA, CA199, CHROMGRNA in the last 8760 hours.  Assessment and Plan:  Andrea Marks is a 71 y.o. female with past medical history significant for diabetes, hypertension and hyperlipidemia who has been referred by Dr. Demetrius Revel for consideration of IOVERA therapy treatment for left knee pain  Patient has a long history of left-sided knee pain, recently significantly worsened though she denies an inciting injury.  Patient presently rates her knee pain as the following: - 6-7/10 at rest - 10/10 with activity  I explained that the [place] treatment is a new modality that utilizes cyroneurolysis technology to temporarily alter the pain recepting nerves supplying the anterior aspect of the knee, in hopes of achieving a clinically significant reduction in knee pain.     I explained that if successful, the patient will experience a rapid pain reduction which can last for approximately 3 months.  I explained that while their knee pain may be reduced, the structural damage of the knee remains, and thus, this is not a curative technology and their knee pain will return.     Risks associated with the procedure include bleeding/bruising, infection and post procedural paresthesias/weakness.   The procedure is performed as an outpatient basis at [facility].  The procedure is performed soley with local anesthesia, and therefore the patient does need to be NPO, there is no postprocedural recovery and the patient does not need a driver home.  Additionally, the patient does not have to hold any other their medications, including anticoagulation.  On the day of the procedure, the patient is encouraged to wear either shorts or loose fitting pants that can be rolled up to the upper thigh.     Following this prolonged and detailed conversation, the patient wishes to pursue IOVERA therapy of the left knee.    As such, pending insurance approval, this procedure would be scheduled at [facility] long hospital at the  next earliest convenience.  The patient knows to call the interventional  radiology clinic with any interval questions or concerns   Thank you for this interesting consult.  I greatly enjoyed meeting Andrea Marks and look forward to participating in their care.  A copy of this report was sent to the requesting provider on this date.  Electronically Signed: Sandi Mariscal 12/11/2021, 10:20 AM   I spent a total of 15 Minutes in remote  clinical consultation, greater than 50% of which was counseling/coordinating care for left knee pain.    Visit type: Audio only (telephone). Audio (no video) only due to patient's lack of internet/smartphone capability. Alternative for in-person consultation at [facility], [address] [place], [place], [place]. This visit type was conducted due to national recommendations for restrictions regarding the COVID-19 Pandemic (e.g. social distancing).  This format is felt to be most appropriate for this patient at this time.  All issues noted in this document were discussed and addressed.

## 2021-12-14 ENCOUNTER — Other Ambulatory Visit (HOSPITAL_COMMUNITY): Payer: Self-pay | Admitting: Interventional Radiology

## 2021-12-14 DIAGNOSIS — M25562 Pain in left knee: Secondary | ICD-10-CM

## 2021-12-21 ENCOUNTER — Ambulatory Visit: Payer: Medicare Other | Admitting: *Deleted

## 2021-12-21 DIAGNOSIS — Z794 Long term (current) use of insulin: Secondary | ICD-10-CM

## 2021-12-21 DIAGNOSIS — M170 Bilateral primary osteoarthritis of knee: Secondary | ICD-10-CM

## 2021-12-21 DIAGNOSIS — E785 Hyperlipidemia, unspecified: Secondary | ICD-10-CM

## 2021-12-21 DIAGNOSIS — F418 Other specified anxiety disorders: Secondary | ICD-10-CM

## 2021-12-21 DIAGNOSIS — L089 Local infection of the skin and subcutaneous tissue, unspecified: Secondary | ICD-10-CM

## 2021-12-21 DIAGNOSIS — F4321 Adjustment disorder with depressed mood: Secondary | ICD-10-CM

## 2021-12-21 DIAGNOSIS — E1165 Type 2 diabetes mellitus with hyperglycemia: Secondary | ICD-10-CM

## 2021-12-21 DIAGNOSIS — I803 Phlebitis and thrombophlebitis of lower extremities, unspecified: Secondary | ICD-10-CM

## 2021-12-21 DIAGNOSIS — E1169 Type 2 diabetes mellitus with other specified complication: Secondary | ICD-10-CM

## 2021-12-21 DIAGNOSIS — H5461 Unqualified visual loss, right eye, normal vision left eye: Secondary | ICD-10-CM

## 2021-12-21 DIAGNOSIS — M1711 Unilateral primary osteoarthritis, right knee: Secondary | ICD-10-CM

## 2021-12-21 DIAGNOSIS — F321 Major depressive disorder, single episode, moderate: Secondary | ICD-10-CM

## 2021-12-21 DIAGNOSIS — H4710 Unspecified papilledema: Secondary | ICD-10-CM

## 2021-12-21 DIAGNOSIS — M19012 Primary osteoarthritis, left shoulder: Secondary | ICD-10-CM

## 2021-12-21 DIAGNOSIS — R5383 Other fatigue: Secondary | ICD-10-CM

## 2021-12-21 DIAGNOSIS — I10 Essential (primary) hypertension: Secondary | ICD-10-CM

## 2021-12-21 NOTE — Patient Instructions (Signed)
Visit Information  Thank you for taking time to visit with me today. Please don't hesitate to contact me if I can be of assistance to you before our next scheduled telephone appointment.  Following are the goals we discussed today:  Patient Goals/Self-Care Activities: Continue to receive personal counseling with LCSW, on a bi-weekly basis, to reduce and manage symptoms of Depression, Anxiety, and Grief, until well-controlled.   Handrails on front porch have been installed, so now you are able to safely enter and exit your home without assistance.   Keep appointment with Dr. Byrd Hesselbach, Orthopedic Surgeon with [place] at [facility], to undergo Iovera Treatment Procedure, scheduled on 12/31/2021 at 12:00 pm. Continue to review resource information and brochures provided, on power stair lifts and walk-in, handicapped accessible showers.   Contact LCSW directly (# [id number]), if you have questions, need assistance, or if additional social work needs are identified in the near future.    Follow-Up Date:  01/06/2022 at 10:30 am  Please call the care guide team at [phone] if you need to cancel or reschedule your appointment.   If you are experiencing a Mental Health or [place] or need someone to talk to, please call the Suicide and Crisis Lifeline: 988 call the [place] National Suicide Prevention Lifeline: 480-331-9164 or TTY: (224) 537-8857 TTY (937)092-0917) to talk to a trained counselor call 1-800-273-TALK (toll free, 24 hour hotline) go to [facility] [address] [place] [phone]) call the [place]: [phone] call 911   Patient verbalizes understanding of instructions and care plan provided today and agrees to view in [facility]. Active MyChart status confirmed with patient.    [place] Licensed Clinical Social Worker [facility] [phone]

## 2021-12-21 NOTE — Chronic Care Management (AMB) (Signed)
Chronic Care Management    Clinical Social Work Note  12/21/2021 Name: Andrea Marks MRN: [id number] DOB: [birth year]  Andrea Marks is a 71 y.o. year old female who is a primary care patient of [name], DO. The CCM team was consulted to assist the patient with chronic disease management and/or care coordination needs related to: [employer], Mental Health Counseling and Resources, and Grief Counseling.   Engaged with patient by telephone for follow up visit in response to provider referral for social work chronic care management and care coordination services.   Consent to Services:  The patient was given information about Chronic Care Management services, agreed to services, and gave verbal consent prior to initiation of services.  Please see initial visit note for detailed documentation.   Patient agreed to services and consent obtained.   Assessment: Review of patient past medical history, allergies, medications, and health status, including review of relevant consultants reports was performed today as part of a comprehensive evaluation and provision of chronic care management and care coordination services.     SDOH (Social Determinants of Health) assessments and interventions performed:    Advanced Directives Status: Not addressed in this encounter.  CCM Care Plan  Allergies  Allergen Reactions   Hydrocodone-Acetaminophen Other (See Comments)    unknown   Oxycodone Hcl Other (See Comments)    unknown   Penicillins Other (See Comments)    unknown   Prednisone     Patient has noted allergy to prednisone but she says it was a questionable history of mild nausea with prednisone and nothing more     Outpatient Encounter Medications as of 12/21/2021  Medication Sig   fenofibrate 160 MG tablet TAKE 1 TABLET DAILY   acetaminophen (TYLENOL) 650 MG CR tablet Take 650 mg by mouth 2 (two) times daily as needed for pain. For pain    aspirin 81 MG tablet Take 81 mg by mouth daily.   benazepril (LOTENSIN) 20 MG tablet TAKE 1 TABLET DAILY   Blood Glucose Monitoring Suppl (ONE TOUCH ULTRA SYSTEM KIT) W/DEVICE KIT 1 kit by Does not apply route once.   clonazePAM (KLONOPIN) 0.5 MG tablet 1/2-1 po qhs prn   Continuous Blood Gluc Receiver (FREESTYLE LIBRE 14 DAY READER) DEVI 1 Device by Does not apply route See admin instructions. Use for continuous monitor glucose   Continuous Blood Gluc Sensor (FREESTYLE LIBRE 14 DAY SENSOR) MISC Apply new sensor every 14 days for continuous glucose monitoring.   estradiol (ESTRACE) 1 MG tablet TAKE 1 TABLET DAILY   ezetimibe-simvastatin (VYTORIN) 10-40 MG tablet Take 1 tablet by mouth at bedtime.   FLUoxetine (PROZAC) 20 MG tablet Take 3 tablets (60 mg total) by mouth daily.   glucose blood (ONETOUCH ULTRA) test strip Use to check blood sugar 3 times a day   insulin aspart (NOVOLOG FLEXPEN) 100 UNIT/ML FlexPen Inject 15-18 Units into the skin 3 (three) times daily with meals.   Insulin Glargine (BASAGLAR KWIKPEN) 100 UNIT/ML INJECT 50 UNITS SUBCUTANEOUSLY AT BEDTIME   Insulin Pen Needle (PEN NEEDLES) 32G X 4 MM MISC USE AS INSTRUCTED WITH LANCETS.   meloxicam (MOBIC) 15 MG tablet Take 1 tablet (15 mg total) by mouth daily.   metFORMIN (GLUCOPHAGE) 850 MG tablet Take 1 tablet in the AM, and 2 tablets in the PM.   pantoprazole (PROTONIX) 40 MG tablet TAKE 1 TABLET DAILY   Semaglutide, 1 MG/DOSE, (OZEMPIC, 1 MG/DOSE,) 4 MG/3ML SOPN INJECT $RemoveBef'1MG'[id number]$  SUBCUTANEOUSLY  ONCE A  WEEK.   Vitamin D, Ergocalciferol, (DRISDOL) 1.25 MG (50000 UNIT) CAPS capsule Take 1 capsule (50,000 Units total) by mouth every 7 (seven) days.   Facility-Administered Encounter Medications as of 12/21/2021  Medication   ipratropium-albuterol (DUONEB) 0.5-2.5 (3) MG/3ML nebulizer solution 3 mL    Patient Active Problem List   Diagnosis Date Noted   Depression with anxiety 10/01/2021   Insomnia 10/01/2021   Vision loss of right  eye    Nonintractable headache    Papilledema 02/26/2021   Degenerative arthritis of knee, bilateral 10/30/2020   Degenerative arthritis of right knee 12/25/2018   Fatigue 12/15/2018   Hyperlipidemia associated with type 2 diabetes mellitus ([place]) 12/15/2018   B12 deficiency 12/15/2018   AC (acromioclavicular) joint arthritis 06/20/2018   Left rotator cuff tear 05/29/2018   Hyperlipidemia LDL goal <70 12/19/2017   Morbid obesity ([place]) 11/15/2016   Type 2 diabetes mellitus with hyperglycemia, with long-term current use of insulin ([place]) 10/23/2015   Post-traumatic wound infection 04/17/2013   Thrombophlebitis leg 02/23/2011   TINEA CORPORIS 12/30/2008   Depression, major, single episode, moderate ([place]) 12/14/2006   Essential hypertension 12/14/2006   GERD 12/14/2006    Conditions to be addressed/monitored: Anxiety, Grief, and Depression.  Limited Social Support, Mental Health Concerns, Social Isolation, Limited Access to Caregiver, and Lacks Knowledge of [employer].  Care Plan : LCSW Plan of Care  Updates made by Francis Gaines, LCSW since 12/21/2021 12:00 AM     Problem: Reduce and Manage My Symptoms of Depression.   Priority: High     Long-Range Goal: Reduce and Manage My Symptoms of Depression.   Start Date: 10/13/2021  Expected End Date: 02/11/2022  This Visit's Progress: On track  Recent Progress: On track  Priority: High  Note:   Current Barriers:   Acute Mental Health Needs related to Degenerative Arthritis of Bilateral Knees, Right Eye Vision Loss, Morbid Obesity, Major Depression, Anxiety, and Grief, requires Support, Education, Resources, Referrals, Advocacy, and Care Coordination in order to meet Unmet Mental Health Needs. Clinical Goal(s):  Patient will work with LCSW to reduce and manage symptoms of Depression, Anxiety, and Grief, until well-controlled.     Patient will increase knowledge and/or ability of:        Coping Skills, Healthy Habits,  Self-Management Skills, Stress Reduction, Home Safety, and Utilizing [employer] and Resources.  Interventions:  Collaboration with Primary Care Physician, Dr. Roma Schanz regarding development and update of comprehensive plan of care as evidenced by provider attestation and co-signature. Inter-disciplinary care team collaboration (see longitudinal plan of care). Clinical Interventions:  Solution-Focused Therapy Performed. Emotional Support Provided and Verbalization of Feelings Encouraged. Problem Solving Solutions Developed. Cognitive Behavioral Therapy Initiated. Grief and Loss Support Group Participation Emphasized.     Patient Goals/Self-Care Activities: Continue to receive personal counseling with LCSW, on a bi-weekly basis, to reduce and manage symptoms of Depression, Anxiety, and Grief, until well-controlled.   Handrails on front porch have been installed, so now you are able to safely enter and exit your home without assistance.   Keep appointment with Dr. Byrd Hesselbach, Orthopedic Surgeon with [place] [facility], to undergo Iovera Treatment Procedure, scheduled on 12/31/2021 at 12:00 pm. Continue to review resource information and brochures provided, on power stair lifts and walk-in, handicapped accessible showers.   Contact LCSW directly (# [phone]), if you have questions, need assistance, or if additional social work needs are identified in the near future.    Follow-Up Date:  01/06/2022  at 10:30 am  [place] Social Worker [place] [phone]

## 2021-12-31 ENCOUNTER — Other Ambulatory Visit: Payer: Self-pay

## 2021-12-31 ENCOUNTER — Other Ambulatory Visit (HOSPITAL_COMMUNITY): Payer: Self-pay | Admitting: Physician Assistant

## 2021-12-31 ENCOUNTER — Ambulatory Visit (HOSPITAL_COMMUNITY)
Admission: RE | Admit: 2021-12-31 | Discharge: 2021-12-31 | Disposition: A | Discharge status: Home / Self Care | Payer: Medicare Other | Source: Ambulatory Visit | Attending: Interventional Radiology | Admitting: Interventional Radiology

## 2021-12-31 DIAGNOSIS — M25562 Pain in left knee: Secondary | ICD-10-CM | POA: Insufficient documentation

## 2021-12-31 HISTORY — PX: IR ABLATE LIVER CRYOABLATION: IMG5524

## 2021-12-31 IMAGING — US IR US GUIDED PERIPHERAL NERVE CRYOABLATION KNEE LEFT
1 series · 3 of 3 positions shown · non-contrast
Comparison: None

INDICATION: Left sided knee pain. Please refer to formal consultation in the [facility] EMR for additional details.
TECHNIQUE: Informed written consent was obtained from the patient after a
thorough discussion of the procedural risks, benefits and
alternatives. All questions were addressed. A timeout was performed
prior to the initiation of the procedure.

[Series 1: ir (id) (id) · 3 of 3 slices shown]
[im 1/3]
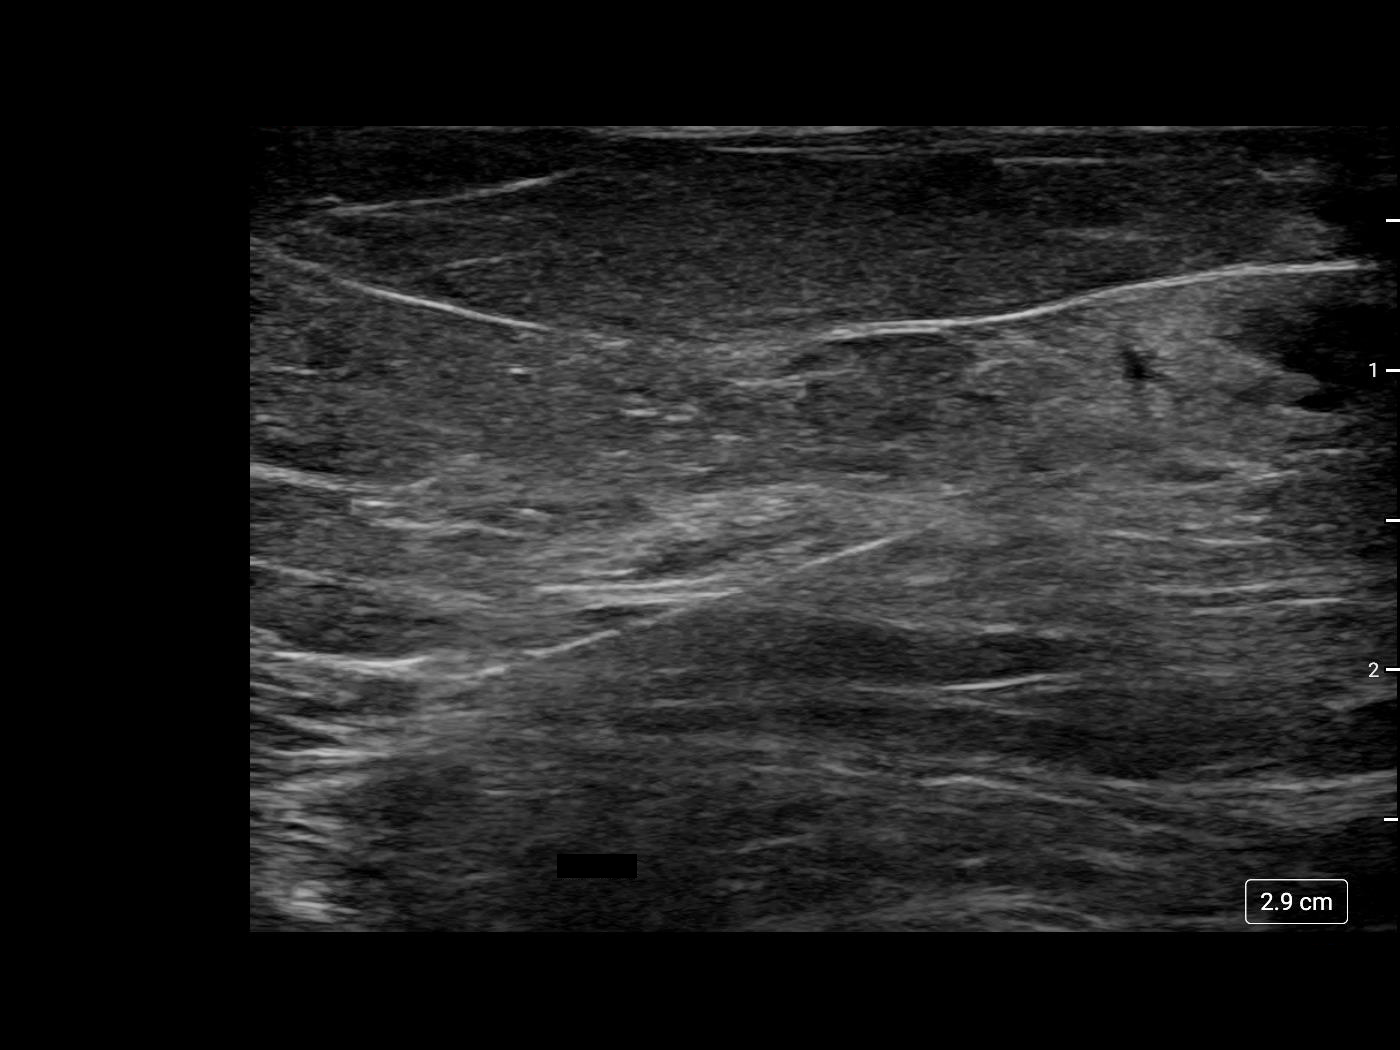
[im 2/3]
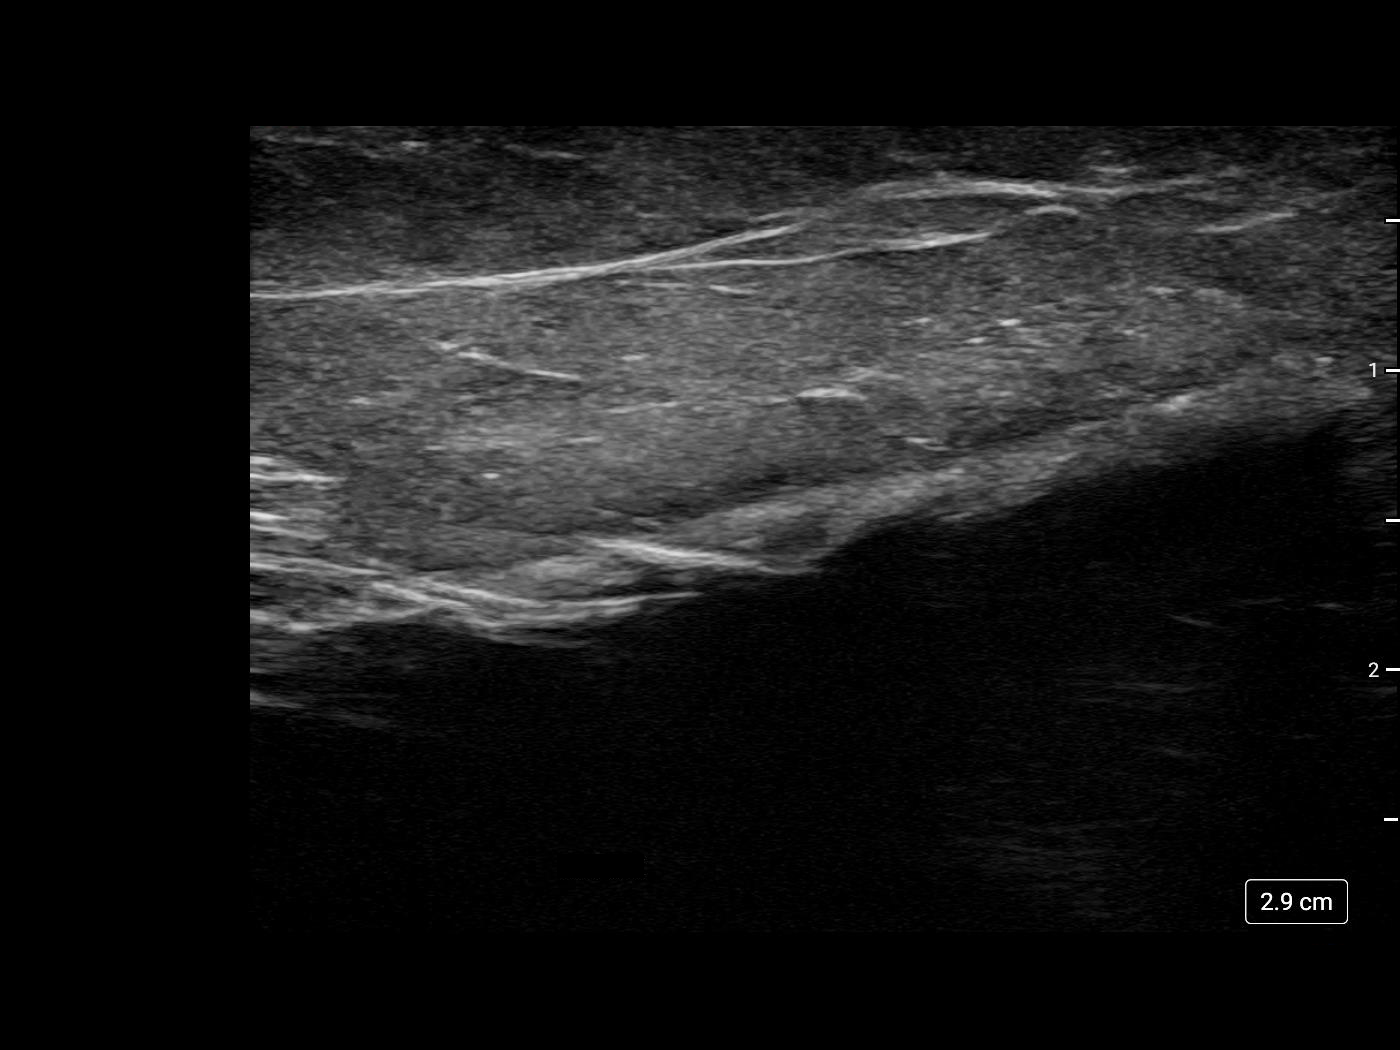
[im 3/3]
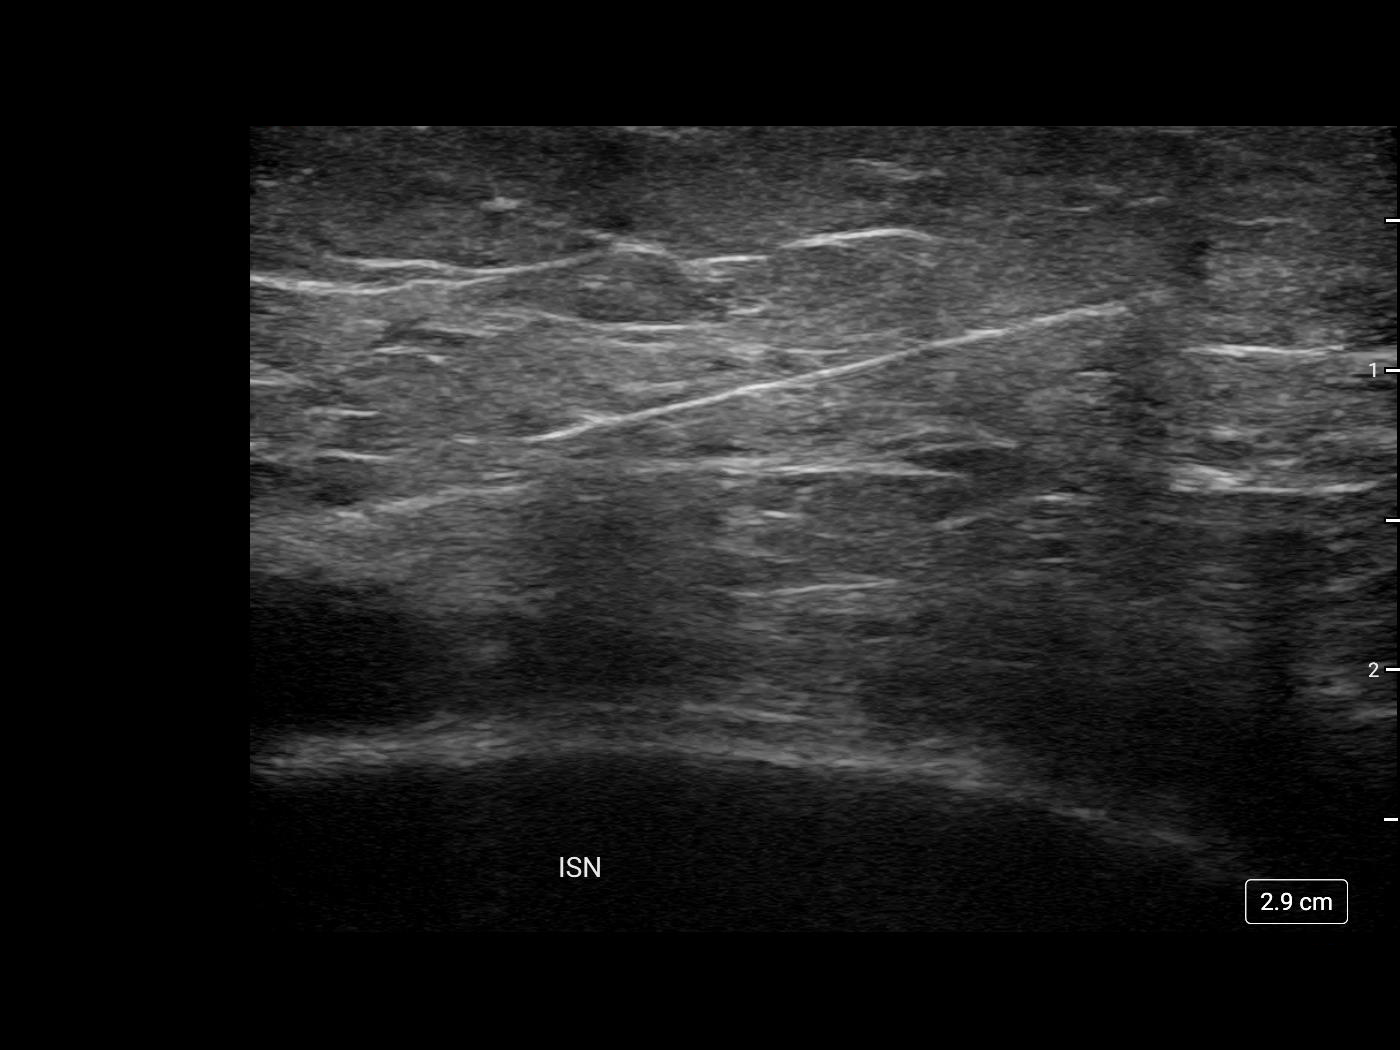

[3 of 3 positions shown; findings below may reference images not displayed]

EXAM:
1. ULTRASOUND-GUIDED CRYONEUROLYSIS (IOVERA) OF THE INTERMEDIATE
DIVISION OF THE ANTERIOR FEMORAL CUTANEOUS NERVE (IAFCN)
2. ULTRASOUND-GUIDED CRYONEUROLYSIS (IOVERA) OF THE LATERAL FEMORAL
CUTANEOUS NERVE (LFCN)
3. ULTRASOUND-GUIDED CRYONEUROLYSIS (IOVERA) OF THE INFRAPATELLAR
BRANCH OF THE SAPHENOUS NERVE (ISN)
MEDICATIONS:
None

ANESTHESIA/SEDATION:
None

FLUOROSCOPY TIME:  None

COMPLICATIONS:
None immediate.
The patient was positioned supine and sonographic evaluation of the
affected thigh and knee was performed by the dictating
interventional radiologist. Selected nerves were marked with a skin
pen and the procedure was planned.

Attention was first paid towards cryoablation of the intermediate
division of the anterior femoral cutaneous nerve (IAFCN).

The skin overlying the selected dermal access site was cleaned with
chlorhexidine. Local anesthesia was provided with a combination of
topical anesthetic spray followed by subcutaneous administration 1%
lidocaine with epinephrine.

Next, under direct ultrasound guidance, the 20 gauge Smart Tip 190
IOVERA cryoneurolysis probe was advanced with tip ultimately
positioned immediately adjacent to the targeted nerve. Multiple
ultrasound images were saved for procedural documentation purposes.

The cryoneurolysis was then performed for 1 minute, 45 seconds for a
total of 1 cycle(s) with intermittent sonographer evaluation.

The identical procedure was repeated targeting the lateral femoral
cutaneous nerve (LFCN) and the infrapatellar branch of the saphenous
nerve (ISN). The cryoneurolysis was performed for each of the above
nerve for 1 minute, 45 seconds for a total of 1 cycle each, all with
intermittent sonographer evaluation. Multiple ultrasound images were
saved for procedural documentation purposes.

Following completion of the procedure, sensation about the anterior
aspect of the knee was assessed and a postprocedural pain scale was
obtained.

Dressings were applied. The patient tolerated the procedure well
without immediate postprocedural complication.
FINDINGS: Sonographic guidance confirms appropriate positioning of the Smart
Tip 190 IOVERA cryoneurolysis probe immediately adjacent to the
targeted nerves with reduction in patient's postprocedural pain
scale as follows:

Pre procedural MELCH[facility] score: 53/96

Pre procedural knee pain:

At rest: [DATE]

With activity: [DATE]

Post procedural knee pain:

At rest: [DATE]

With activity: [DATE]
IMPRESSION: Technically successful cryoneurolysis (IOVERA) of the IAFCN, LFCN
and the ISN for left sided knee pain with clinically significant
reduction in postprocedural pain scale.

## 2021-12-31 MED ORDER — LIDOCAINE-EPINEPHRINE 1 %-1:100000 IJ SOLN
INTRAMUSCULAR | Status: AC
  Administered 2021-12-31: 15 mL
  Filled 2021-12-31: qty 1

## 2021-12-31 NOTE — Procedures (Signed)
Pre Procedure Dx: Left sided knee pain ?Post Procedural Dx: Same ? ?Technically successful cryoneurolysis (IOVERA) of the IAFCN, LFCN and the ISN for left sided knee pain with clinically significant reduction in postprocedural pain scale. ? ?Pre procedural WOMAC score: 53/96  ? ?Pre procedural knee pain: ?At rest: 3/10 ?With activity: 7/10 ? ?Post procedural knee pain: ?At rest: 1/10 ?With activity: 1/10 ? ?Katherina Right, MD ?Pager #: [phone] ? ? ?

## 2022-01-05 LAB — HM DIABETES EYE EXAM

## 2022-01-06 ENCOUNTER — Other Ambulatory Visit: Payer: Self-pay

## 2022-01-06 ENCOUNTER — Other Ambulatory Visit: Payer: Self-pay | Admitting: Interventional Radiology

## 2022-01-06 ENCOUNTER — Ambulatory Visit (INDEPENDENT_AMBULATORY_CARE_PROVIDER_SITE_OTHER): Payer: Medicare Other | Admitting: *Deleted

## 2022-01-06 ENCOUNTER — Ambulatory Visit (INDEPENDENT_AMBULATORY_CARE_PROVIDER_SITE_OTHER): Payer: Medicare Other | Admitting: Podiatry

## 2022-01-06 ENCOUNTER — Encounter: Payer: Self-pay | Admitting: Podiatry

## 2022-01-06 DIAGNOSIS — M19012 Primary osteoarthritis, left shoulder: Secondary | ICD-10-CM

## 2022-01-06 DIAGNOSIS — L089 Local infection of the skin and subcutaneous tissue, unspecified: Secondary | ICD-10-CM

## 2022-01-06 DIAGNOSIS — M25562 Pain in left knee: Secondary | ICD-10-CM

## 2022-01-06 DIAGNOSIS — M170 Bilateral primary osteoarthritis of knee: Secondary | ICD-10-CM

## 2022-01-06 DIAGNOSIS — M79676 Pain in unspecified toe(s): Secondary | ICD-10-CM

## 2022-01-06 DIAGNOSIS — E1151 Type 2 diabetes mellitus with diabetic peripheral angiopathy without gangrene: Secondary | ICD-10-CM

## 2022-01-06 DIAGNOSIS — E785 Hyperlipidemia, unspecified: Secondary | ICD-10-CM

## 2022-01-06 DIAGNOSIS — L84 Corns and callosities: Secondary | ICD-10-CM

## 2022-01-06 DIAGNOSIS — B351 Tinea unguium: Secondary | ICD-10-CM

## 2022-01-06 DIAGNOSIS — T148XXA Other injury of unspecified body region, initial encounter: Secondary | ICD-10-CM

## 2022-01-06 DIAGNOSIS — E1165 Type 2 diabetes mellitus with hyperglycemia: Secondary | ICD-10-CM

## 2022-01-06 DIAGNOSIS — F418 Other specified anxiety disorders: Secondary | ICD-10-CM

## 2022-01-06 DIAGNOSIS — Z794 Long term (current) use of insulin: Secondary | ICD-10-CM | POA: Diagnosis not present

## 2022-01-06 DIAGNOSIS — S46012A Strain of muscle(s) and tendon(s) of the rotator cuff of left shoulder, initial encounter: Secondary | ICD-10-CM

## 2022-01-06 DIAGNOSIS — I10 Essential (primary) hypertension: Secondary | ICD-10-CM

## 2022-01-06 NOTE — Progress Notes (Signed)
This patient returns to my office for at risk foot care.  This patient requires this care by a professional since this patient will be at risk due to having diabetes and history of thrombophlebitis.  This patient says her callus on her big toes is painful walking and wearing her shoes.  She is unable to self treat.  This patient presents for at risk foot care today. ? ?General Appearance  Alert, conversant and in no acute stress. ? ?Vascular  Dorsalis pedis and posterior tibial  pulses are not  palpable  Bilaterally  Due to swelling..  Capillary return is within normal limits  bilaterally. Temperature is within normal limits  bilaterally. ? ?Neurologic  Senn-Weinstein monofilament wire test diminished  bilaterally. Muscle power within normal limits bilaterally. ? ?Nails Thick disfigured discolored nails with subungual debris  from hallux to fifth toes bilaterally. No evidence of bacterial infection or drainage bilaterally. ? ?Orthopedic  No limitations of motion  feet .  No crepitus or effusions noted.  No bony pathology or digital deformities noted. ? ?Skin  normotropic skin with no porokeratosis noted bilaterally.  No signs of infections or ulcers noted.   Pinch callus  B/L.   No infection or drainage noted. ? ?Onychomycosis  Pain in right toes  Pain in left toes   ? ?Consent was obtained for treatment procedures.   Mechanical debridement of nails 1-5  bilaterally performed with a nail nipper.  Filed with dremel without incident.  Patient has had nerve surgery to eliminate pain in her left knee.  Not working yet. ? ? ?Return office visit   9  weeks                  Told patient to return for periodic foot care and evaluation due to potential at risk complications. ? ? ?Gardiner Barefoot DPM  ?

## 2022-01-07 ENCOUNTER — Ambulatory Visit
Admission: RE | Admit: 2022-01-07 | Discharge: 2022-01-07 | Disposition: A | Discharge status: Home / Self Care | Payer: Medicare Other | Source: Ambulatory Visit | Attending: Interventional Radiology | Admitting: Interventional Radiology

## 2022-01-07 ENCOUNTER — Encounter: Payer: Self-pay | Admitting: *Deleted

## 2022-01-07 ENCOUNTER — Other Ambulatory Visit: Payer: Self-pay | Admitting: Interventional Radiology

## 2022-01-07 DIAGNOSIS — M25562 Pain in left knee: Secondary | ICD-10-CM

## 2022-01-07 HISTORY — PX: IR RADIOLOGIST EVAL & MGMT: IMG5224

## 2022-01-07 NOTE — Patient Instructions (Signed)
Visit Information ? ?Thank you for taking time to visit with me today. Please don't hesitate to contact me if I can be of assistance to you before our next scheduled telephone appointment. ? ?Following are the goals we discussed today:  ?Patient Goals/Self-Care Activities: ?Continue to receive personal counseling with LCSW, on a bi-weekly basis, to reduce and manage symptoms of Depression, Anxiety, and Grief, until well-controlled.  ?Keep follow-up appointment with Dr. Simonne Come, Radiologist with [facility]:  A Division of [facility] Radiology with [facility], scheduled on 01/07/2022 at 8:30 am, status-post Iovera Treatment Procedure, which took place on 12/31/2021 at 12:00 pm. ?~ Ensure that you communicate with Dr. Simonne Come that you have been experiencing worsening paresthesias involving anterior of leg, excruciating with movement.   ?Thoroughly enjoy your time with your sister and niece, as they come to visit next week, assisting you with completion of applications and filing taxes, running errands, getting your house in order, etc. ?Continue to review resource information and brochures provided, on power stair lifts and walk-in, handicapped accessible showers.   ?Contact LCSW directly (# [phone]), if you have questions, need assistance, or if additional social work needs are identified in the near future.    ?Follow-Up Date:  01/20/2022 at 9:45 am ? ?Please call the care guide team at [phone] if you need to cancel or reschedule your appointment.  ? ?If you are experiencing a Mental Health or Behavioral Health Crisis or need someone to talk to, please call the Suicide and Crisis Lifeline: 988 ?call the [place] National Suicide Prevention Lifeline: (715)596-9527 or TTY: 810-229-0625 TTY (747)721-5970) to talk to a trained counselor ?call 1-800-273-TALK (toll free, 24 hour hotline) ?go to [facility] [address]  [place] [phone]) ?call the [facility]: [phone] ?call 911  ? ?Patient verbalizes understanding of instructions and care plan provided today and agrees to view in MyChart. Active MyChart status confirmed with patient.   ? ?Danford Bad LCSW ?Licensed Clinical Social Worker ?[facility] ?[phone]  ?

## 2022-01-07 NOTE — Chronic Care Management (AMB) (Signed)
?Chronic Care Management  ? ? Clinical Social Work Note ? ?01/07/2022 ?Name: Andrea Marks MRN: [id number] DOB: [birth year] ? ?Ritu Gagliardo is a 71 y.o. year old female who is a primary care patient of [name], DO. The CCM team was consulted to assist the patient with chronic disease management and/or care coordination needs related to: [employer], Mental Health Counseling and Resources, and Grief Counseling.  ? ?Engaged with patient by telephone for follow up visit in response to provider referral for social work chronic care management and care coordination services.  ? ?Consent to Services:  ?The patient was given information about Chronic Care Management services, agreed to services, and gave verbal consent prior to initiation of services.  Please see initial visit note for detailed documentation.  ? ?Patient agreed to services and consent obtained.  ? ?Assessment: Review of patient past medical history, allergies, medications, and health status, including review of relevant consultants reports was performed today as part of a comprehensive evaluation and provision of chronic care management and care coordination services.    ? ?SDOH (Social Determinants of Health) assessments and interventions performed:   ? ?Advanced Directives Status: Not addressed in this encounter. ? ?CCM Care Plan ? ?Allergies  ?Allergen Reactions  ? Hydrocodone-Acetaminophen Other (See Comments)  ?  unknown  ? Oxycodone Hcl Other (See Comments)  ?  unknown  ? Penicillins Other (See Comments)  ?  unknown  ? Prednisone   ?  Patient has noted allergy to prednisone but she says it was a questionable history of mild nausea with prednisone and nothing more ?  ? ? ?Outpatient Encounter Medications as of 01/06/2022  ?Medication Sig  ? fenofibrate 160 MG tablet TAKE 1 TABLET DAILY  ? acetaminophen (TYLENOL) 650 MG CR tablet Take 650 mg by mouth 2 (two) times daily as needed for pain. For pain  ? aspirin  81 MG tablet Take 81 mg by mouth daily.  ? benazepril (LOTENSIN) 20 MG tablet TAKE 1 TABLET DAILY  ? Blood Glucose Monitoring Suppl (ONE TOUCH ULTRA SYSTEM KIT) W/DEVICE KIT 1 kit by Does not apply route once.  ? clonazePAM (KLONOPIN) 0.5 MG tablet 1/2-1 po qhs prn  ? Continuous Blood Gluc Receiver (FREESTYLE LIBRE 14 DAY READER) DEVI 1 Device by Does not apply route See admin instructions. Use for continuous monitor glucose  ? Continuous Blood Gluc Sensor (FREESTYLE LIBRE 14 DAY SENSOR) MISC Apply new sensor every 14 days for continuous glucose monitoring.  ? estradiol (ESTRACE) 1 MG tablet TAKE 1 TABLET DAILY  ? ezetimibe-simvastatin (VYTORIN) 10-40 MG tablet Take 1 tablet by mouth at bedtime.  ? FLUoxetine (PROZAC) 20 MG tablet Take 3 tablets (60 mg total) by mouth daily.  ? glucose blood (ONETOUCH ULTRA) test strip Use to check blood sugar 3 times a day  ? insulin aspart (NOVOLOG FLEXPEN) 100 UNIT/ML FlexPen Inject 15-18 Units into the skin 3 (three) times daily with meals.  ? Insulin Glargine (BASAGLAR KWIKPEN) 100 UNIT/ML INJECT 50 UNITS SUBCUTANEOUSLY AT BEDTIME  ? Insulin Pen Needle (PEN NEEDLES) 32G X 4 MM MISC USE AS INSTRUCTED WITH LANCETS.  ? meloxicam (MOBIC) 15 MG tablet Take 1 tablet (15 mg total) by mouth daily.  ? metFORMIN (GLUCOPHAGE) 850 MG tablet Take 1 tablet in the AM, and 2 tablets in the PM.  ? pantoprazole (PROTONIX) 40 MG tablet TAKE 1 TABLET DAILY  ? Semaglutide, 1 MG/DOSE, (OZEMPIC, 1 MG/DOSE,) 4 MG/3ML SOPN INJECT 1MG SUBCUTANEOUSLY  ONCE A  WEEK.  ? Vitamin D, Ergocalciferol, (DRISDOL) 1.25 MG (50000 UNIT) CAPS capsule Take 1 capsule (50,000 Units total) by mouth every 7 (seven) days.  ? ?Facility-Administered Encounter Medications as of 01/06/2022  ?Medication  ? ipratropium-albuterol (DUONEB) 0.5-2.5 (3) MG/3ML nebulizer solution 3 mL  ? ? ?Patient Active Problem List  ? Diagnosis Date Noted  ? Depression with anxiety 10/01/2021  ? Insomnia 10/01/2021  ? Vision loss of right eye   ?  Nonintractable headache   ? 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 Degenerative arthritis of knee, bilateral 10/30/2020  ? Degenerative arthritis of right knee 12/25/2018  ? Fatigue 12/15/2018  ? Hyperlipidemia associated with type 2 diabetes mellitus ([place]) 12/15/2018  ? B12 deficiency 12/15/2018  ? AC (acromioclavicular) joint arthritis 06/20/2018  ? Left rotator cuff tear 05/29/2018  ? Hyperlipidemia LDL goal <70 12/19/2017  ? Morbid obesity ([place]) 11/15/2016  ? Type 2 diabetes mellitus with hyperglycemia, with long-term current use of insulin ([place]) 10/23/2015  ? Post-traumatic wound infection 04/17/2013  ? Thrombophlebitis leg 02/23/2011  ? TINEA CORPORIS 12/30/2008  ? Depression, major, single episode, moderate ([place]) 12/14/2006  ? Essential hypertension 12/14/2006  ? GERD 12/14/2006  ? ? ?Conditions to be addressed/monitored: Anxiety, Depression, and Grief.  Limited Social Support, Mental Health Concerns, Social Isolation, and Lacks Knowledge of [employer]. ? ?Care Plan : LCSW Plan of Care  ?Updates made by Francis Gaines, LCSW since 01/07/2022 12:00 AM  ?  ? ?Problem: Reduce and Manage My Symptoms of Depression.   ?Priority: High  ?  ? ?Long-Range Goal: Reduce and Manage My Symptoms of Depression.   ?Start Date: 10/13/2021  ?Expected End Date: 02/11/2022  ?This Visit's Progress: On track  ?Recent Progress: On track  ?Priority: High  ?Note:   ?Current Barriers:   ?Acute Mental Health Needs related to Degenerative Arthritis of Bilateral Knees, Right Eye Vision Loss, Morbid Obesity, Major Depression, Anxiety, and Grief, requires Support, Education, Resources, Referrals, Advocacy, and Care Coordination in order to meet Unmet Mental Health Needs. ?Clinical Goal(s):  ?Patient will work with LCSW to reduce and manage symptoms of Depression, Anxiety, and Grief, until well-controlled.     ?Patient will increase knowledge and/or ability of:  ?      Coping Skills, Healthy Habits, Self-Management Skills, Stress Reduction, Home  Safety, and Utilizing [employer] and Resources.  ?Interventions:  ?Collaboration with Primary Care Physician, Dr. Roma Schanz regarding development and update of comprehensive plan of care as evidenced by provider attestation and co-signature. ?Inter-disciplinary care team collaboration (see longitudinal plan of care). ?Clinical Interventions:  ?Solution-Focused Therapy Performed. ?Emotional Support Provided and Verbalization of Feelings Encouraged. ?Problem Solving Solutions Developed. ?Cognitive Behavioral Therapy Initiated. ?Grief and Loss Support Group Participation Emphasized.     ?Patient Goals/Self-Care Activities: ?Continue to receive personal counseling with LCSW, on a bi-weekly basis, to reduce and manage symptoms of Depression, Anxiety, and Grief, until well-controlled.  ?Keep follow-up appointment with Dr. Sandi Mariscal, Radiologist with [facility]:  A Division of [facility] Radiology with [place], scheduled on 01/07/2022 at 8:30 am, status-post Iovera Treatment Procedure, which took place on 12/31/2021 at 12:00 pm. ?~ Ensure that you communicate with Dr. Sandi Mariscal that you have been experiencing worsening paresthesias involving anterior of leg, excruciating with movement.   ?Thoroughly enjoy your time with your sister and niece, as they come to visit next week, assisting you with completion of applications and filing taxes, running errands, getting your house in order, etc. ?Continue to review resource  information and brochures provided, on power stair lifts and walk-in, handicapped accessible showers.   ?Contact LCSW directly (# [phone]), if you have questions, need assistance, or if additional social work needs are identified in the near future.    ?Follow-Up Date:  01/20/2022 at 9:45 am  ?Nat Christen LCSW ?Licensed Clinical Social Worker ?[place] ?[phone]  ? ? ?

## 2022-01-07 NOTE — Progress Notes (Signed)
Patient ID: Andrea Marks, female   DOB: Jul 30, 1951, 71 y.o.   MRN: [id number]        Chief Complaint: Left knee pain, post IOVERA on 12/31/21   Referring Physician(s): Dalldorf,Peter  History of Present Illness: Andrea Marks is a 71 y.o. female with past medical history significant for diabetes, hypertension and hyperlipidemia who is seen in telemedicine consultation today post left-sided IOVERA therapy on 12/31/2021.  In review, the patient has a long history of left-sided knee pain.  Previously her knee pain was adequately managed with cortisone injection and gel shots however recently these interventions have only resulted in approximately 2 to 3 days of pain relief. Patient states that her knee pain has recently significantly worsened though she denies an inciting injury.  Patient lives alone and ambulates around her home with a rolling walker.  She has difficulty navigating stairs.  Patient states she is both not a candidate for a knee replacement given her weight though additionally is hesitant to undergo any surgery.  For the above, patient underwent highly successful ultrasound-guided left knee iovera treatment on 12/31/2021 with results as follows:  Pre procedural WOMAC score: 53/96    Pre procedural knee pain: At rest: 3/10 With activity: 7/10   Post procedural knee pain: At rest: 1/10 With activity: 1/10  The patient states that she experienced significant improvement in her knee pain 1 to 2 days following the procedure however since that time, she has experienced worsening paresthesias involving the anterior aspect of her leg which are worse with movement.  Current pain scales are as follows: At rest: 3/10 With activity: 8/10   Past Medical History:  Diagnosis Date   Depression    Diabetes mellitus    GERD (gastroesophageal reflux disease)    Hyperlipidemia    Hypertension    Obesity     Past Surgical History:  Procedure Laterality Date    ABDOMINAL HYSTERECTOMY  1991   BSO   IR ABLATE LIVER CRYOABLATION  12/31/2021   IR RADIOLOGIST EVAL & MGMT  12/11/2021    Allergies: Hydrocodone-acetaminophen, Oxycodone hcl, Penicillins, and Prednisone  Medications: Prior to Admission medications   Medication Sig Start Date End Date Taking? Authorizing Provider  fenofibrate 160 MG tablet TAKE 1 TABLET DAILY 12/07/21   Carollee Herter, Alferd Apa, DO  acetaminophen (TYLENOL) 650 MG CR tablet Take 650 mg by mouth 2 (two) times daily as needed for pain. For pain    [provider]  aspirin 81 MG tablet Take 81 mg by mouth daily.    [provider]  benazepril (LOTENSIN) 20 MG tablet TAKE 1 TABLET DAILY 11/17/21   Carollee Herter, Alferd Apa, DO  Blood Glucose Monitoring Suppl (ONE TOUCH ULTRA SYSTEM KIT) W/DEVICE KIT 1 kit by Does not apply route once. 04/06/13   Philemon Kingdom, MD  clonazePAM (KLONOPIN) 0.5 MG tablet 1/2-1 po qhs prn 09/29/21   Carollee Herter, Alferd Apa, DO  Continuous Blood Gluc Receiver (FREESTYLE LIBRE 14 DAY READER) DEVI 1 Device by Does not apply route See admin instructions. Use for continuous monitor glucose 11/21/19   Philemon Kingdom, MD  Continuous Blood Gluc Sensor (FREESTYLE LIBRE 14 DAY SENSOR) MISC Apply new sensor every 14 days for continuous glucose monitoring. 04/10/21   Philemon Kingdom, MD  estradiol (ESTRACE) 1 MG tablet TAKE 1 TABLET DAILY 10/01/21   Carollee Herter, Alferd Apa, DO  ezetimibe-simvastatin (VYTORIN) 10-40 MG tablet Take 1 tablet by mouth at bedtime. 11/17/21   [name],  DO  FLUoxetine (PROZAC) 20 MG tablet Take 3 tablets (60 mg total) by mouth daily. 03/19/21   Roma Schanz R, DO  glucose blood (ONETOUCH ULTRA) test strip Use to check blood sugar 3 times a day 11/08/19   Philemon Kingdom, MD  insulin aspart (NOVOLOG FLEXPEN) 100 UNIT/ML FlexPen Inject 15-18 Units into the skin 3 (three) times daily with meals. 11/16/21   Philemon Kingdom, MD  Insulin Glargine [facility] KWIKPEN)  100 UNIT/ML INJECT 50 UNITS SUBCUTANEOUSLY AT BEDTIME 11/16/21   Philemon Kingdom, MD  Insulin Pen Needle (PEN NEEDLES) 32G X 4 MM MISC USE AS INSTRUCTED WITH LANCETS. 12/10/20   Philemon Kingdom, MD  meloxicam (MOBIC) 15 MG tablet Take 1 tablet (15 mg total) by mouth daily. 08/14/21   Lyndal Pulley, DO  metFORMIN (GLUCOPHAGE) 850 MG tablet Take 1 tablet in the AM, and 2 tablets in the PM. 11/16/21   Philemon Kingdom, MD  pantoprazole (PROTONIX) 40 MG tablet TAKE 1 TABLET DAILY 03/31/21   Carollee Herter, Yvonne R, DO  Semaglutide, 1 MG/DOSE, (OZEMPIC, 1 MG/DOSE,) 4 MG/3ML SOPN INJECT 1MG SUBCUTANEOUSLY  ONCE A WEEK. 11/16/21   Philemon Kingdom, MD  Vitamin D, Ergocalciferol, (DRISDOL) 1.25 MG (50000 UNIT) CAPS capsule Take 1 capsule (50,000 Units total) by mouth every 7 (seven) days. 06/30/21   Lyndal Pulley, DO     Family History  Problem Relation Age of Onset   Macular degeneration Mother    Heart disease Mother        syncope   Aortic stenosis Mother    Lung disease Father 24       mesothelioma   Cancer Maternal Aunt        stomach   Heart disease Paternal Uncle        cabg   Diabetes Paternal Uncle    Heart disease Paternal Uncle    Diabetes Paternal Uncle    Diabetes Paternal Uncle    Heart disease Paternal Uncle    Heart disease Paternal Uncle    Heart disease Paternal Uncle    Lung cancer Other        asbestos   Diabetes Paternal Grandmother    Cancer Other        lung    Social History   Socioeconomic History   Marital status: Widowed    Spouse name: Not on file   Number of children: 0   Years of education: 41   Highest education level: Some college, no degree  Occupational History   Occupation: retired from [employer]: RETIRED  Tobacco Use   Smoking status: Former    Types: Cigarettes    Quit date: 1995    Years since quitting: 28.2    Passive exposure: Past   Smokeless tobacco: Never  Vaping Use   Vaping Use: Never used  Substance and  Sexual Activity   Alcohol use: No   Drug use: No   Sexual activity: Not Currently    Partners: Male  Other Topics Concern   Not on file  Social History Narrative   Lives with husband   Right Handed   Drinks 3-5 cups caffeine daily   Social Determinants of Health   Financial Resource Strain: Low Risk    Difficulty of Paying Living Expenses: Not hard at all  Food Insecurity: No Food Insecurity   Worried About Charity fundraiser in the Last Year: Never true   [place] in the Last Year:  Never true  Transportation Needs: No Transportation Needs   Lack of Transportation (Medical): No   Lack of Transportation (Non-Medical): No  Physical Activity: Inactive   Days of Exercise per Week: 0 days   Minutes of Exercise per Session: 0 min  Stress: Stress Concern Present   Feeling of Stress : Very much  Social Connections: Socially Isolated   Frequency of Communication with Friends and Family: More than three times a week   Frequency of Social Gatherings with Friends and Family: More than three times a week   Attends Religious Services: Never   Marine scientist or Organizations: No   Attends Archivist Meetings: Never   Marital Status: Widowed    ECOG Status: 2 - Symptomatic, <50% confined to bed  Review of Systems  Review of Systems: A 12 point ROS discussed and pertinent positives are indicated in the HPI above.  All other systems are negative.  Physical Exam No direct physical exam was performed (except for noted visual exam findings with Video Visits).   Vital Signs: There were no vitals taken for this visit.  Imaging: IR US GUIDED PERIPHERAL NERVE CRYOABLATION KNEE LEFT  Result Date: 12/31/2021 INDICATION: Left sided knee pain. Please refer to formal consultation in the [facility] for additional details. EXAM: 1. ULTRASOUND-GUIDED CRYONEUROLYSIS (IOVERA) OF THE INTERMEDIATE DIVISION OF THE ANTERIOR FEMORAL CUTANEOUS NERVE (IAFCN) 2. ULTRASOUND-GUIDED  CRYONEUROLYSIS (IOVERA) OF THE LATERAL FEMORAL CUTANEOUS NERVE (LFCN) 3. ULTRASOUND-GUIDED CRYONEUROLYSIS (IOVERA) OF THE INFRAPATELLAR BRANCH OF THE SAPHENOUS NERVE (ISN) COMPARISON:  None MEDICATIONS: None ANESTHESIA/SEDATION: None FLUOROSCOPY TIME:  None COMPLICATIONS: None immediate. TECHNIQUE: Informed written consent was obtained from the patient after a thorough discussion of the procedural risks, benefits and alternatives. All questions were addressed. A timeout was performed prior to the initiation of the procedure. The patient was positioned supine and sonographic evaluation of the affected thigh and knee was performed by the dictating interventional radiologist. Selected nerves were marked with a skin pen and the procedure was planned. Attention was first paid towards cryoablation of the intermediate division of the anterior femoral cutaneous nerve (IAFCN). The skin overlying the selected dermal access site was cleaned with chlorhexidine. Local anesthesia was provided with a combination of topical anesthetic spray followed by subcutaneous administration 1% lidocaine with epinephrine. Next, under direct ultrasound guidance, the 20 gauge Smart Tip 190 IOVERA cryoneurolysis probe was advanced with tip ultimately positioned immediately adjacent to the targeted nerve. Multiple ultrasound images were saved for procedural documentation purposes. The cryoneurolysis was then performed for 1 minute, 45 seconds for a total of 1 cycle(s) with intermittent sonographer evaluation. The identical procedure was repeated targeting the lateral femoral cutaneous nerve (LFCN) and the infrapatellar branch of the saphenous nerve (ISN). The cryoneurolysis was performed for each of the above nerve for 1 minute, 45 seconds for a total of 1 cycle each, all with intermittent sonographer evaluation. Multiple ultrasound images were saved for procedural documentation purposes. Following completion of the procedure, sensation about the  anterior aspect of the knee was assessed and a postprocedural pain scale was obtained. Dressings were applied. The patient tolerated the procedure well without immediate postprocedural complication. FINDINGS: Sonographic guidance confirms appropriate positioning of the Smart Tip 190 IOVERA cryoneurolysis probe immediately adjacent to the targeted nerves with reduction in patient's postprocedural pain scale as follows: Pre procedural WOMAC score: 53/96 Pre procedural knee pain: At rest: 3/10 With activity: 7/10 Post procedural knee pain: At rest: 1/10 With activity: 1/10 IMPRESSION: Technically successful  cryoneurolysis (IOVERA) of the IAFCN, LFCN and the ISN for left sided knee pain with clinically significant reduction in postprocedural pain scale. Electronically Signed   By: Sandi Mariscal M.D.   On: 12/31/2021 13:33   IR Radiologist Eval & Mgmt  Result Date: 12/11/2021 Please refer to notes tab for details about interventional procedure. (Op Note)   Labs:  CBC: Recent Labs    03/02/21 0351 03/03/21 0437 03/04/21 0025 03/19/21 1131  WBC 8.2 8.4 7.5 9.9  HGB 12.9 12.9 13.1 14.0  HCT 40.5 40.6 42.2 41.9  PLT 196 196 207 172.0    COAGS: No results for input(s): INR, APTT in the last 8760 hours.  BMP: Recent Labs    03/01/21 0052 03/02/21 0351 03/03/21 0437 03/04/21 0025 03/19/21 1131  NA 138 139 138 140 140  K 4.9 4.8 4.5 4.7 4.7  CL 102 102 101 102 103  CO2 _0 GLUCOSE 263* 264* 262* 135* 138*  BUN 17 24* 31* 33* 16  CALCIUM 9.1 9.2 8.8* 9.0 9.2  CREATININE 0.98 0.98 1.03* 1.06* 0.79  GFRNONAA >60 >60 58* 57*  --     LIVER FUNCTION TESTS: No results for input(s): BILITOT, AST, ALT, ALKPHOS, PROT, ALBUMIN in the last 8760 hours.  TUMOR MARKERS: No results for input(s): AFPTM, CEA, CA199, CHROMGRNA in the last 8760 hours.  Assessment and Plan:  Andrea Marks is a 71 y.o. female with past medical history significant for diabetes, hypertension and  hyperlipidemia who is seen in telemedicine consultation today post left-sided IOVERA therapy on 12/31/2021.  While patient experienced a transient improvement in her left knee pain, unfortunately, she has experienced symptomatic paresthesias involving the anterior aspect of her leg which are worse with activity.  I explained that I have had previous patients who have experienced rebound paresthesias associated with the distribution of the targeted cutaneous nerves and ultimately several of those patients have done well and benefited from the treatment, however there is a chance that she may be a nonresponder.  If this were the case, I explained that her cutaneous nerves typically regenerate in approximately 2 to 3 months.    I again enforced that the nerves targeted are cutaneous nerves and not involved with her motor function.  Following this prolonged and detailed conversation, the patient demonstrated excellent understanding of the both.  Regardless, I will coordinate to have a follow-up telemedicine consultation in 3 to 4 weeks in hopes that she ultimately has experienced some improvement after undergoing the IOVERA treatment.  Plan: - Follow-up telemedicine consultation in 3 to 4 weeks.  Thank you for this interesting consult.  I greatly enjoyed meeting Andrea Marks and look forward to participating in their care.  A copy of this report was sent to the requesting provider on this date.  Electronically Signed: Sandi Mariscal 01/07/2022, 8:31 AM   I spent a total of 10 Minutes in remote  clinical consultation, greater than 50% of which was counseling/coordinating care for post left knee IOVERA.    Visit type: Audio only (telephone). Audio (no video) only due to patient's lack of internet/smartphone capability. Alternative for in-person consultation at [facility], [address] [place], [place], [place]. This visit type was conducted due to national recommendations for  restrictions regarding the COVID-19 Pandemic (e.g. social distancing).  This format is felt to be most appropriate for this patient at this time.  All issues noted in this document were discussed and addressed.

## 2022-01-20 ENCOUNTER — Ambulatory Visit: Payer: Medicare Other | Admitting: *Deleted

## 2022-01-20 DIAGNOSIS — F321 Major depressive disorder, single episode, moderate: Secondary | ICD-10-CM

## 2022-01-20 DIAGNOSIS — L089 Local infection of the skin and subcutaneous tissue, unspecified: Secondary | ICD-10-CM

## 2022-01-20 DIAGNOSIS — E785 Hyperlipidemia, unspecified: Secondary | ICD-10-CM

## 2022-01-20 DIAGNOSIS — E1165 Type 2 diabetes mellitus with hyperglycemia: Secondary | ICD-10-CM

## 2022-01-20 DIAGNOSIS — M170 Bilateral primary osteoarthritis of knee: Secondary | ICD-10-CM

## 2022-01-20 DIAGNOSIS — F418 Other specified anxiety disorders: Secondary | ICD-10-CM

## 2022-01-20 NOTE — Patient Instructions (Addendum)
Visit Information ? ?Thank you for taking time to visit with me today. Please don't hesitate to contact me if I can be of assistance to you before our next scheduled telephone appointment. ? ?Following are the goals we discussed today:  ?Patient Goals/Self-Care Activities: ?Contact LCSW directly (# [phone]), if you have questions, need assistance, or if additional social work needs are identified in the near future.    ?No Follow-Up Required.   ? ?Please call the care guide team at [phone] if you need to cancel or reschedule your appointment.  ? ?If you are experiencing a Mental Health or Behavioral Health Crisis or need someone to talk to, please call the Suicide and Crisis Lifeline: 988 ?call the [place] National Suicide Prevention Lifeline: 1-800-273-8255 or TTY: 1-800-799-4 TTY (1-800-799-4889) to talk to a trained counselor ?call 1-800-273-TALK (toll free, 24 hour hotline) ?go to [facility] [address], [place] ([phone]) ?call the [place] Crisis Line: 800-939-9988 ?call 911  ? ?Patient verbalizes understanding of instructions and care plan provided today and agrees to view in MyChart. Active MyChart status confirmed with patient.   ? ?Dasani Thurlow LCSW ?Licensed Clinical Social Worker ?[facility] ?[phone]  ?

## 2022-01-20 NOTE — Chronic Care Management (AMB) (Signed)
?Chronic Care Management  ? ? Clinical Social Work Note ? ?01/20/2022 ?Name: Andrea Marks MRN: [id number] DOB: 05-08-1951 ? ?Andrea Marks is a 71 y.o. year old female who is a primary care patient of [name], DO. The CCM team was consulted to assist the patient with chronic disease management and/or care coordination needs related to: [employer].  ? ?Engaged with patient by telephone for follow up visit in response to provider referral for social work chronic care management and care coordination services.  ? ?Consent to Services:  ?The patient was given information about Chronic Care Management services, agreed to services, and gave verbal consent prior to initiation of services.  Please see initial visit note for detailed documentation.  ? ?Patient agreed to services and consent obtained.  ? ?Assessment: Review of patient past medical history, allergies, medications, and health status, including review of relevant consultants reports was performed today as part of a comprehensive evaluation and provision of chronic care management and care coordination services.    ? ?SDOH (Social Determinants of Health) assessments and interventions performed:   ? ?Advanced Directives Status: Not addressed in this encounter. ? ?CCM Care Plan ? ?Allergies  ?Allergen Reactions  ? Hydrocodone-Acetaminophen Other (See Comments)  ?  unknown  ? Oxycodone Hcl Other (See Comments)  ?  unknown  ? Penicillins Other (See Comments)  ?  unknown  ? Prednisone   ?  Patient has noted allergy to prednisone but she says it was a questionable history of mild nausea with prednisone and nothing more ?  ? ? ?Outpatient Encounter Medications as of 01/20/2022  ?Medication Sig  ? fenofibrate 160 MG tablet TAKE 1 TABLET DAILY  ? acetaminophen (TYLENOL) 650 MG CR tablet Take 650 mg by mouth 2 (two) times daily as needed for pain. For pain  ? aspirin 81 MG tablet Take 81 mg by mouth daily.  ? benazepril  (LOTENSIN) 20 MG tablet TAKE 1 TABLET DAILY  ? Blood Glucose Monitoring Suppl (ONE TOUCH ULTRA SYSTEM KIT) W/DEVICE KIT 1 kit by Does not apply route once.  ? clonazePAM (KLONOPIN) 0.5 MG tablet 1/2-1 po qhs prn  ? Continuous Blood Gluc Receiver (FREESTYLE LIBRE 14 DAY READER) DEVI 1 Device by Does not apply route See admin instructions. Use for continuous monitor glucose  ? Continuous Blood Gluc Sensor (FREESTYLE LIBRE 14 DAY SENSOR) MISC Apply new sensor every 14 days for continuous glucose monitoring.  ? estradiol (ESTRACE) 1 MG tablet TAKE 1 TABLET DAILY  ? ezetimibe-simvastatin (VYTORIN) 10-40 MG tablet Take 1 tablet by mouth at bedtime.  ? FLUoxetine (PROZAC) 20 MG tablet Take 3 tablets (60 mg total) by mouth daily.  ? glucose blood (ONETOUCH ULTRA) test strip Use to check blood sugar 3 times a day  ? insulin aspart (NOVOLOG FLEXPEN) 100 UNIT/ML FlexPen Inject 15-18 Units into the skin 3 (three) times daily with meals.  ? Insulin Glargine (BASAGLAR KWIKPEN) 100 UNIT/ML INJECT 50 UNITS SUBCUTANEOUSLY AT BEDTIME  ? Insulin Pen Needle (PEN NEEDLES) 32G X 4 MM MISC USE AS INSTRUCTED WITH LANCETS.  ? meloxicam (MOBIC) 15 MG tablet Take 1 tablet (15 mg total) by mouth daily.  ? metFORMIN (GLUCOPHAGE) 850 MG tablet Take 1 tablet in the AM, and 2 tablets in the PM.  ? pantoprazole (PROTONIX) 40 MG tablet TAKE 1 TABLET DAILY  ? Semaglutide, 1 MG/DOSE, (OZEMPIC, 1 MG/DOSE,) 4 MG/3ML SOPN INJECT 1MG SUBCUTANEOUSLY  ONCE A WEEK.  ? Vitamin D, Ergocalciferol, (DRISDOL) 1.25  MG (50000 UNIT) CAPS capsule Take 1 capsule (50,000 Units total) by mouth every 7 (seven) days.  ? ?Facility-Administered Encounter Medications as of 01/20/2022  ?Medication  ? ipratropium-albuterol (DUONEB) 0.5-2.5 (3) MG/3ML nebulizer solution 3 mL  ? ? ?Patient Active Problem List  ? Diagnosis Date Noted  ? Depression with anxiety 10/01/2021  ? Insomnia 10/01/2021  ? Vision loss of right eye   ? Nonintractable headache   ? 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  Degenerative arthritis of knee, bilateral 10/30/2020  ? Degenerative arthritis of right knee 12/25/2018  ? Fatigue 12/15/2018  ? Hyperlipidemia associated with type 2 diabetes mellitus ([place]) 12/15/2018  ? B12 deficiency 12/15/2018  ? AC (acromioclavicular) joint arthritis 06/20/2018  ? Left rotator cuff tear 05/29/2018  ? Hyperlipidemia LDL goal <70 12/19/2017  ? Morbid obesity ([place]) 11/15/2016  ? Type 2 diabetes mellitus with hyperglycemia, with long-term current use of insulin ([place]) 10/23/2015  ? Post-traumatic wound infection 04/17/2013  ? Thrombophlebitis leg 02/23/2011  ? TINEA CORPORIS 12/30/2008  ? Depression, major, single episode, moderate ([place]) 12/14/2006  ? Essential hypertension 12/14/2006  ? GERD 12/14/2006  ? ? ?Conditions to be addressed/monitored:   Degenerative Arthritis of Bilateral Knees, Right Eye Vision Loss, Morbid Obesity, Major Depression, Anxiety, and Grief.  Lacks Knowledge of [employer]. ? ?Care Plan : LCSW Plan of Care  ?Updates made by Francis Gaines, LCSW since 01/20/2022 12:00 AM  ?  ? ?Problem: Reduce and Manage My Symptoms of Depression. Resolved 01/20/2022  ?Priority: High  ?  ? ?Long-Range Goal: Reduce and Manage My Symptoms of Depression. Completed 01/20/2022  ?Start Date: 10/13/2021  ?Expected End Date: 01/20/2022  ?This Visit's Progress: On track  ?Recent Progress: On track  ?Priority: High  ?Note:   ?Current Barriers:   ?Acute Mental Health Needs related to Degenerative Arthritis of Bilateral Knees, Right Eye Vision Loss, Morbid Obesity, Major Depression, Anxiety, and Grief, requires Support, Education, Resources, Referrals, Advocacy, and Care Coordination in order to meet Unmet Mental Health Needs. ?Clinical Goal(s):  ?Patient will work with LCSW to reduce and manage symptoms of Depression, Anxiety, and Grief, until well-controlled.     ?Patient will increase knowledge and/or ability of:  ?      Coping Skills, Healthy Habits, Self-Management Skills, Stress Reduction,  Home Safety, and Utilizing [employer] and Resources.  ?Interventions:  ?Collaboration with Primary Care Physician, Dr. Roma Schanz regarding development and update of comprehensive plan of care as evidenced by provider attestation and co-signature. ?Inter-disciplinary care team collaboration (see longitudinal plan of care). ?Clinical Interventions:  ?Solution-Focused Therapy Performed. ?Emotional Support Provided and Verbalization of Feelings Encouraged. ?Problem Solving Solutions Developed. ?Cognitive Behavioral Therapy Initiated. ?Grief and Loss Support Group Participation Emphasized.     ?Patient Goals/Self-Care Activities: ?Contact LCSW directly (# [phone]), if you have questions, need assistance, or if additional social work needs are identified in the near future.    ?No Follow-Up Required.    ?Nat Christen LCSW ?Licensed Clinical Social Worker ?[facility] ?[phone]  ? ?

## 2022-01-21 NOTE — Addendum Note (Signed)
Encounter addended by: Arby Barrette on: 01/21/2022 10:14 AM ? Actions taken: Imaging Exam ended

## 2022-01-29 DIAGNOSIS — F321 Major depressive disorder, single episode, moderate: Secondary | ICD-10-CM

## 2022-01-29 DIAGNOSIS — Z794 Long term (current) use of insulin: Secondary | ICD-10-CM

## 2022-01-29 DIAGNOSIS — E1165 Type 2 diabetes mellitus with hyperglycemia: Secondary | ICD-10-CM | POA: Diagnosis not present

## 2022-01-29 DIAGNOSIS — M19012 Primary osteoarthritis, left shoulder: Secondary | ICD-10-CM

## 2022-01-29 DIAGNOSIS — F418 Other specified anxiety disorders: Secondary | ICD-10-CM

## 2022-01-29 DIAGNOSIS — E785 Hyperlipidemia, unspecified: Secondary | ICD-10-CM | POA: Diagnosis not present

## 2022-01-29 DIAGNOSIS — I10 Essential (primary) hypertension: Secondary | ICD-10-CM

## 2022-01-29 DIAGNOSIS — M170 Bilateral primary osteoarthritis of knee: Secondary | ICD-10-CM

## 2022-02-02 ENCOUNTER — Other Ambulatory Visit: Payer: Self-pay | Admitting: Family Medicine

## 2022-02-02 DIAGNOSIS — I10 Essential (primary) hypertension: Secondary | ICD-10-CM

## 2022-02-03 ENCOUNTER — Encounter (HOSPITAL_COMMUNITY): Payer: Self-pay | Admitting: Radiology

## 2022-02-03 ENCOUNTER — Other Ambulatory Visit (HOSPITAL_COMMUNITY): Payer: Self-pay | Admitting: Interventional Radiology

## 2022-02-03 DIAGNOSIS — M25562 Pain in left knee: Secondary | ICD-10-CM

## 2022-02-03 NOTE — Addendum Note (Signed)
Encounter addended by: Arby Barrette on: 02/03/2022 2:46 PM ? Actions taken: Image moved

## 2022-02-03 NOTE — Addendum Note (Signed)
Encounter addended by: Wolfgang Phoenix on: 02/03/2022 2:41 PM ? Actions taken: Imaging Exam ended

## 2022-02-03 NOTE — Addendum Note (Signed)
Encounter addended by: Arby Barrette on: 02/03/2022 2:52 PM ? Actions taken: Imaging Exam ended, Charge Capture section accepted

## 2022-02-05 NOTE — Addendum Note (Signed)
Encounter addended by: Arby Barrette on: 02/05/2022 9:21 AM ? Actions taken: Imaging Exam ended, Charge Capture section accepted

## 2022-02-15 ENCOUNTER — Encounter: Payer: Self-pay | Admitting: *Deleted

## 2022-02-15 ENCOUNTER — Ambulatory Visit
Admission: RE | Admit: 2022-02-15 | Discharge: 2022-02-15 | Disposition: A | Discharge status: Home / Self Care | Payer: Medicare Other | Source: Ambulatory Visit | Attending: Interventional Radiology | Admitting: Interventional Radiology

## 2022-02-15 DIAGNOSIS — M25562 Pain in left knee: Secondary | ICD-10-CM

## 2022-02-15 HISTORY — PX: IR RADIOLOGIST EVAL & MGMT: IMG5224

## 2022-02-15 NOTE — Progress Notes (Signed)
Patient ID: Andrea Marks, female   DOB: 10-15-1951, 71 y.o.   MRN: [id number] ? ?    ? ? ?Chief Complaint: ?Left knee pain, post IOVERA on 12/31/2021 ?  ?Referring Physician(s): ?Dalldorf,Peter ?  ?History of Present Illness: ?Andrea Marks is a 71 y.o. female with past medical history significant for diabetes, hypertension and hyperlipidemia who has been referred by Dr. Rhona Raider for left knee pain, post left knee IOVERA Rx on 12/31/2021.   ? ?The patient is seen in telemedicine consultation for postprocedural evaluation and management. ? ?In review, the patient has a long history of left-sided knee pain.  Previously her knee pain was adequately managed with cortisone injection and gel shots however recently these interventions have only resulted in approximately 2 to 3 days of pain relief. ? ?Patient states that her knee pain has recently significantly worsened though she denies an inciting injury. ? ?Patient lives alone and ambulates around her home with a rolling walker.  She has difficulty navigating stairs. ? ?Patient states she is both not a candidate for a knee replacement given her weight though additionally is hesitant to undergo any surgery. ? ?Patient presently rates her knee pain as the following: ?- 6-7/10 at rest ?- 10/10 with activity ?  ?The patient does report mild right-sided knee pain but states this is not presently lifestyle limiting.   ? ?Immediately following the Iovera therapy, patient reported her postprocedural pain as follows:  ?- 1/10 at rest ?- 1/10 with activity. ? ?Unfortunately, patient reports that her symptomatic left knee improvement was short-lived and presently rates her left knee pain as follows: ?- 4-5 / 10 at rest ?- 7-8 / 10 with activity ? ?Overall, she feels that she has not significantly benefited from the [place] therapy. ? ?Past Medical History:  ?Diagnosis Date  ? Depression   ? Diabetes mellitus   ? GERD (gastroesophageal reflux disease)   ?  Hyperlipidemia   ? Hypertension   ? Obesity   ? ? ?Past Surgical History:  ?Procedure Laterality Date  ? ABDOMINAL HYSTERECTOMY  1991  ? BSO  ? IR ABLATE LIVER CRYOABLATION  12/31/2021  ? IR RADIOLOGIST EVAL & MGMT  12/11/2021  ? IR RADIOLOGIST EVAL & MGMT  01/07/2022  ? ? ?Allergies: ?Hydrocodone-acetaminophen, Oxycodone hcl, Penicillins, and Prednisone ? ?Medications: ?Prior to Admission medications   ?Medication Sig Start Date End Date Taking? Authorizing Provider  ?fenofibrate 160 MG tablet TAKE 1 TABLET DAILY 12/07/21   Carollee Herter, Alferd Apa, DO  ?acetaminophen (TYLENOL) 650 MG CR tablet Take 650 mg by mouth 2 (two) times daily as needed for pain. For pain    [provider]  ?aspirin 81 MG tablet Take 81 mg by mouth daily.    [provider]  ?benazepril (LOTENSIN) 20 MG tablet TAKE 1 TABLET DAILY 02/02/22   [name], DO  ?Blood Glucose Monitoring Suppl (ONE TOUCH ULTRA SYSTEM KIT) W/DEVICE KIT 1 kit by Does not apply route once. 04/06/13   Philemon Kingdom, MD  ?clonazePAM [facility] [place]) 0.5 MG tablet 1/2-1 po qhs prn 09/29/21   [name], DO  ?Continuous Blood Gluc Receiver (FREESTYLE LIBRE 14 DAY READER) DEVI 1 Device by Does not apply route See admin instructions. Use for continuous monitor glucose 11/21/19   Philemon Kingdom, MD  ?Continuous Blood Gluc Sensor (FREESTYLE LIBRE 14 DAY SENSOR) MISC Apply new sensor every 14 days for continuous glucose monitoring. 04/10/21   Philemon Kingdom, MD  ?estradiol (ESTRACE) 1 MG  tablet TAKE 1 TABLET DAILY 10/01/21   Carollee Herter, Alferd Apa, DO  ?ezetimibe-simvastatin (VYTORIN) 10-40 MG tablet Take 1 tablet by mouth at bedtime. 11/17/21   [name], DO  ?FLUoxetine (PROZAC) 20 MG tablet Take 3 tablets (60 mg total) by mouth daily. 03/19/21   [name], DO  ?glucose blood (ONETOUCH ULTRA) test strip Use to check blood sugar 3 times a day 11/08/19   Philemon Kingdom, MD  ?insulin aspart (NOVOLOG FLEXPEN) 100 UNIT/ML  FlexPen Inject 15-18 Units into the skin 3 (three) times daily with meals. 11/16/21   Philemon Kingdom, MD  ?Insulin Glargine [facility]) 100 UNIT/ML INJECT 50 UNITS SUBCUTANEOUSLY AT BEDTIME 11/16/21   Philemon Kingdom, MD  ?Insulin Pen Needle (PEN NEEDLES) 32G X 4 MM MISC USE AS INSTRUCTED WITH LANCETS. 12/10/20   Philemon Kingdom, MD  ?meloxicam (MOBIC) 15 MG tablet Take 1 tablet (15 mg total) by mouth daily. 08/14/21   Lyndal Pulley, DO  ?metFORMIN (GLUCOPHAGE) 850 MG tablet Take 1 tablet in the AM, and 2 tablets in the PM. 11/16/21   Philemon Kingdom, MD  ?pantoprazole (PROTONIX) 40 MG tablet TAKE 1 TABLET DAILY 03/31/21   [name], DO  ?Semaglutide, 1 MG/DOSE, (OZEMPIC, 1 MG/DOSE,) 4 MG/3ML SOPN INJECT $RemoveBef'1MG'NLnSGokxfF$  SUBCUTANEOUSLY  ONCE A WEEK. 11/16/21   Philemon Kingdom, MD  ?Vitamin D, Ergocalciferol, (DRISDOL) 1.25 MG (50000 UNIT) CAPS capsule Take 1 capsule (50,000 Units total) by mouth every 7 (seven) days. 06/30/21   Lyndal Pulley, DO  ?  ? ?Family History  ?Problem Relation Age of Onset  ? Macular degeneration Mother   ? Heart disease Mother   ?     syncope  ? Aortic stenosis Mother   ? Lung disease Father 56  ?     mesothelioma  ? Cancer Maternal Aunt   ?     stomach  ? Heart disease Paternal Uncle   ?     cabg  ? Diabetes Paternal Uncle   ? Heart disease Paternal Uncle   ? Diabetes Paternal Uncle   ? Diabetes Paternal Uncle   ? Heart disease Paternal Uncle   ? Heart disease Paternal Uncle   ? Heart disease Paternal Uncle   ? Lung cancer Other   ?     asbestos  ? Diabetes Paternal Grandmother   ? Cancer Other   ?     lung  ? ? ?Social History  ? ?Socioeconomic History  ? Marital status: Widowed  ?  Spouse name: Not on file  ? Number of children: 0  ? Years of education: 38  ? Highest education level: Some college, no degree  ?Occupational History  ? Occupation: retired from [employer]  ?  Employer: RETIRED  ?Tobacco Use  ? Smoking status: Former  ?  Types: Cigarettes  ?  Quit date: 1995   ?  Years since quitting: 28.3  ?  Passive exposure: Past  ? Smokeless tobacco: Never  ?Vaping Use  ? Vaping Use: Never used  ?Substance and Sexual Activity  ? Alcohol use: No  ? Drug use: No  ? Sexual activity: Not Currently  ?  Partners: Male  ?Other Topics Concern  ? Not on file  ?Social History Narrative  ? Lives with husband  ? Right Handed  ? Drinks 3-5 cups caffeine daily  ? ?Social Determinants of Health  ? ?Financial Resource Strain: Low Risk   ? Difficulty of Paying Living Expenses: Not  hard at all  ?Food Insecurity: No Food Insecurity  ? Worried About Charity fundraiser in the Last Year: Never true  ? Ran Out of Food in the Last Year: Never true  ?Transportation Needs: No Transportation Needs  ? Lack of Transportation (Medical): No  ? Lack of Transportation (Non-Medical): No  ?Physical Activity: Inactive  ? Days of Exercise per Week: 0 days  ? Minutes of Exercise per Session: 0 min  ?Stress: Stress Concern Present  ? Feeling of Stress : Very much  ?Social Connections: Socially Isolated  ? Frequency of Communication with Friends and Family: More than three times a week  ? Frequency of Social Gatherings with Friends and Family: More than three times a week  ? Attends Religious Services: Never  ? Active Member of Clubs or Organizations: No  ? Attends Archivist Meetings: Never  ? Marital Status: Widowed  ? ? ?ECOG Status: ?2 - Symptomatic, <50% confined to bed ? ?Review of Systems ? ?Review of Systems: A 12 point ROS discussed and pertinent positives are indicated in the HPI above.  All other systems are negative. ? ?Physical Exam ?No direct physical exam was performed (except for noted visual exam findings with Video Visits).  ? ?Vital Signs: ?There were no vitals taken for this visit. ? ?Imaging: ? ?Ultrasound-guided left knee iovera therapy-12/31/2021 ? ? ?Labs: ? ?CBC: ?Recent Labs  ?  03/02/21 ?0351 03/03/21 ?8527 03/04/21 ?0025 03/19/21 ?1131  ?WBC 8.2 8.4 7.5 9.9  ?HGB 12.9 12.9 13.1 14.0   ?HCT 40.5 40.6 42.2 41.9  ?PLT 196 196 207 172.0  ? ? ?COAGS: ?No results for input(s): INR, APTT in the last 8760 hours. ? ?BMP: ?Recent Labs  ?  03/01/21 ?0052 03/02/21 ?0351 03/03/21 ?7824 03/04/21 ?0025 03/19/21

## 2022-03-10 ENCOUNTER — Ambulatory Visit: Payer: Medicare Other | Admitting: Podiatry

## 2022-03-17 ENCOUNTER — Ambulatory Visit (INDEPENDENT_AMBULATORY_CARE_PROVIDER_SITE_OTHER): Payer: Medicare Other | Admitting: Podiatry

## 2022-03-17 ENCOUNTER — Encounter: Payer: Self-pay | Admitting: Podiatry

## 2022-03-17 DIAGNOSIS — M79676 Pain in unspecified toe(s): Secondary | ICD-10-CM | POA: Diagnosis not present

## 2022-03-17 DIAGNOSIS — B351 Tinea unguium: Secondary | ICD-10-CM

## 2022-03-17 DIAGNOSIS — Z794 Long term (current) use of insulin: Secondary | ICD-10-CM

## 2022-03-17 DIAGNOSIS — L84 Corns and callosities: Secondary | ICD-10-CM

## 2022-03-17 DIAGNOSIS — E1151 Type 2 diabetes mellitus with diabetic peripheral angiopathy without gangrene: Secondary | ICD-10-CM

## 2022-03-17 NOTE — Progress Notes (Signed)
This patient returns to my office for at risk foot care.  This patient requires this care by a professional since this patient will be at risk due to having diabetes and history of thrombophlebitis.  This patient says her callus on her big toes is painful walking and wearing her shoes.  She is unable to self treat.  This patient presents for at risk foot care today.  General Appearance  Alert, conversant and in no acute stress.  Vascular  Dorsalis pedis and posterior tibial  pulses are not  palpable  Bilaterally  Due to swelling..  Capillary return is within normal limits  bilaterally. Temperature is within normal limits  bilaterally.  Neurologic  Senn-Weinstein monofilament wire test diminished  bilaterally. Muscle power within normal limits bilaterally.  Nails Thick disfigured discolored nails with subungual debris  from hallux to fifth toes bilaterally. No evidence of bacterial infection or drainage bilaterally.  Orthopedic  No limitations of motion  feet .  No crepitus or effusions noted.  No bony pathology or digital deformities noted.  Skin  normotropic skin with no porokeratosis noted bilaterally.  No signs of infections or ulcers noted.   Pinch callus  B/L.   No infection or drainage noted.  Onychomycosis  Pain in right toes  Pain in left toes    Consent was obtained for treatment procedures.   Mechanical debridement of nails 1-5  bilaterally performed with a nail nipper.  Filed with dremel without incident.     Return office visit   9  weeks                  Told patient to return for periodic foot care and evaluation due to potential at risk complications.   Trebor Galdamez DPM  

## 2022-03-18 ENCOUNTER — Ambulatory Visit (INDEPENDENT_AMBULATORY_CARE_PROVIDER_SITE_OTHER): Payer: Medicare Other | Admitting: Internal Medicine

## 2022-03-18 ENCOUNTER — Encounter: Payer: Self-pay | Admitting: Internal Medicine

## 2022-03-18 VITALS — BP 120/58 | HR 85 | Ht 63.0 in | Wt 259.2 lb

## 2022-03-18 DIAGNOSIS — E785 Hyperlipidemia, unspecified: Secondary | ICD-10-CM

## 2022-03-18 DIAGNOSIS — Z794 Long term (current) use of insulin: Secondary | ICD-10-CM | POA: Diagnosis not present

## 2022-03-18 DIAGNOSIS — E11319 Type 2 diabetes mellitus with unspecified diabetic retinopathy without macular edema: Secondary | ICD-10-CM

## 2022-03-18 LAB — POCT GLYCOSYLATED HEMOGLOBIN (HGB A1C): Hemoglobin A1C: 5.6 % (ref 4.0–5.6)

## 2022-03-18 MED ORDER — NOVOLOG FLEXPEN 100 UNIT/ML ~~LOC~~ SOPN: 12.0000 [IU] | PEN_INJECTOR | Freq: Three times a day (TID) | SUBCUTANEOUS | 3 refills | Status: DC

## 2022-03-18 MED ORDER — BASAGLAR KWIKPEN 100 UNIT/ML ~~LOC~~ SOPN: PEN_INJECTOR | SUBCUTANEOUS | 3 refills | Status: DC

## 2022-03-18 NOTE — Patient Instructions (Addendum)
Please continue: - Metformin 850 mg in am and 1700 mg at night - Ozempic 1 mg weekly  Please decrease: - Basaglar 36 units at bedtime - NovoLog 12-15 (18 ) 3x a day before meals  Please return in 4 months.

## 2022-03-18 NOTE — Progress Notes (Signed)
Patient ID: Andrea Marks, female   DOB: Jan 13, 1951, 71 y.o.   MRN: [id number]  This visit occurred during the SARS-CoV-2 public health emergency.  Safety protocols were in place, including screening questions prior to the visit, additional usage of staff PPE, and extensive cleaning of exam room while observing appropriate contact time as indicated for disinfecting solutions.   HPI: Andrea Marks is a 71 y.o.-year-old female, returning for f/u for DM2, dx 1994, insulin-dependent, uncontrolled, with complications (diabetic retinopathy). Last visit was 4 months ago.  Interim history: No increased urination, nausea, chest pain.  She has R knee pain - 2 fractures - may need sx. it is hard for her to go up and down stairs and get to to from appointments.  She has a next to her neighbor that helps her. She had a nerve ablation for knee pain. Also a steroid knee injection. She lost consciousness after a vomiting episode 2 weeks ago.  She was down, on the floor, near her bed, the entire night, as she could not get up.  Since then, she ordered another alert bracelet and also an alert button for the shower.  She is not sure if her sugars were low at that time but the investigation was negative for culprits when paramedics came.  Reviewed HbA1c levels: Lab Results  Component Value Date   HGBA1C 5.6 11/16/2021   HGBA1C 5.9 (H) 02/26/2021   HGBA1C 6.0 (A) 12/18/2020   She is on: - Metformin 850 mg in am and  1700 mg at night - Ozempic 1 mg weekly - Basaglar 70 >> 60 >> 50 units at bedtime - NovoLog 3x a day before meals 15-18 units before meals and 5 to 8 units before a snack  Tried Bydureon once a week >> she had problems injecting the medication.  She also tried Victoza. She was on Humalog in the past. She was on Amaryl in the past, which we stopped going to increase her insulin doses 05/2018. She was on a Vgo pump, which we stopped in 2019 as she needed higher doses of  insulin.  She checks her sugars more than 4 times a day with her freestyle libre 2 CGM (she shut down the alarms):   Previously:   Previously:   Lowest blood sugars: 50s >> 70s >> 50s >> 50s. Highest blood sugars: 300s >> 300s (steroids) >> 200s >> 200s.  Meals: - Breakfast: egg + bagel + yoghurt - Lunch: may skip, salad - Dinner: salad, hamburger/hot dog, tuna fish - Snacks: potato chips, pretzel No sodas, only drinks sparkling water.  No CKD, last BUN/creatinine was:  Lab Results  Component Value Date   BUN 16 03/19/2021   CREATININE 0.79 03/19/2021  On benazepril.  + HL; Last set of lipids: Lab Results  Component Value Date   CHOL 98 01/01/2021   HDL 48.70 01/01/2021   LDLCALC 20 01/01/2021   LDLDIRECT 64.0 04/21/2017   TRIG 147.0 01/01/2021   CHOLHDL 2 01/01/2021  On Vytorin and fenofibrate.  - Last eye exam: January 05, 2022: + DR. + macular edema OS. She had cataract surgery. She was admitted with blurry vision, papilledema in 02/26/2021.  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 She saw a retina specialist and a neurologist.  She was on steroids. She started IO injections - had 2 so far.  - + numbness and tingling in her feet.  She sees podiatry-Dr. Prudence Davidson.  Last foot exam 03/2022.  She had an ulcer in  lower right leg (has lymphedema) >> healed. She also has a history of HTN, GERD, depression, h/o thrombophlebitis. Previously on B12 injections now on 1000 mcg p.o. B12 daily.  ROS: + see HPI  I reviewed pt's medications, allergies, PMH, social hx, family hx, and changes were documented in the history of present illness. Otherwise, unchanged from my initial visit note.   Past Medical History:  Diagnosis Date   Depression    Diabetes mellitus    GERD (gastroesophageal reflux disease)    Hyperlipidemia    Hypertension    Obesity    Past Surgical History:  Procedure Laterality Date   ABDOMINAL HYSTERECTOMY  1991   BSO    IR ABLATE LIVER CRYOABLATION  12/31/2021   IR RADIOLOGIST EVAL & MGMT  12/11/2021   IR RADIOLOGIST EVAL & MGMT  01/07/2022   IR RADIOLOGIST EVAL & MGMT  02/15/2022   Social History   Socioeconomic History   Marital status: Widowed    Spouse name: Not on file   Number of children: 0   Years of education: 41   Highest education level: Some college, no degree  Occupational History   Occupation: retired from IT consultant: RETIRED  Tobacco Use   Smoking status: Former    Types: Cigarettes    Quit date: 1995    Years since quitting: 28.3    Passive exposure: Past   Smokeless tobacco: Never  Vaping Use   Vaping Use: Never used  Substance and Sexual Activity   Alcohol use: No   Drug use: No   Sexual activity: Not Currently    Partners: Male  Other Topics Concern   Not on file  Social History Narrative   Lives with husband   Right Handed   Drinks 3-5 cups caffeine daily   Social Determinants of Health   Financial Resource Strain: Low Risk    Difficulty of Paying Living Expenses: Not hard at all  Food Insecurity: No Food Insecurity   Worried About Programme researcher, broadcasting/film/video in the Last Year: Never true   Barista in the Last Year: Never true  Transportation Needs: No Transportation Needs   Lack of Transportation (Medical): No   Lack of Transportation (Non-Medical): No  Physical Activity: Inactive   Days of Exercise per Week: 0 days   Minutes of Exercise per Session: 0 min  Stress: Stress Concern Present   Feeling of Stress : Very much  Social Connections: Socially Isolated   Frequency of Communication with Friends and Family: More than three times a week   Frequency of Social Gatherings with Friends and Family: More than three times a week   Attends Religious Services: Never   Database administrator or Organizations: No   Attends Banker Meetings: Never   Marital Status: Widowed  Catering manager Violence: Not At Risk   Fear of Current or  Ex-Partner: No   Emotionally Abused: No   Physically Abused: No   Sexually Abused: No   Current Outpatient Medications on File Prior to Visit  Medication Sig Dispense Refill   fenofibrate 160 MG tablet TAKE 1 TABLET DAILY 90 tablet 1   acetaminophen (TYLENOL) 650 MG CR tablet Take 650 mg by mouth 2 (two) times daily as needed for pain. For pain     aspirin 81 MG tablet Take 81 mg by mouth daily.     benazepril (LOTENSIN) 20 MG tablet TAKE 1 TABLET DAILY 90 tablet 0  Blood Glucose Monitoring Suppl (ONE TOUCH ULTRA SYSTEM KIT) W/DEVICE KIT 1 kit by Does not apply route once. 1 each 0   clonazePAM (KLONOPIN) 0.5 MG tablet 1/2-1 po qhs prn 20 tablet 1   Continuous Blood Gluc Receiver (FREESTYLE LIBRE 14 DAY READER) DEVI 1 Device by Does not apply route See admin instructions. Use for continuous monitor glucose 1 each 0   Continuous Blood Gluc Sensor (FREESTYLE LIBRE 14 DAY SENSOR) MISC Apply new sensor every 14 days for continuous glucose monitoring. 6 each 2   estradiol (ESTRACE) 1 MG tablet TAKE 1 TABLET DAILY 90 tablet 1   ezetimibe-simvastatin (VYTORIN) 10-40 MG tablet Take 1 tablet by mouth at bedtime. 90 tablet 1   FLUoxetine (PROZAC) 20 MG tablet Take 3 tablets (60 mg total) by mouth daily. 270 tablet 3   glucose blood (ONETOUCH ULTRA) test strip Use to check blood sugar 3 times a day 100 each 0   insulin aspart (NOVOLOG FLEXPEN) 100 UNIT/ML FlexPen Inject 15-18 Units into the skin 3 (three) times daily with meals. 45 mL 3   Insulin Glargine (BASAGLAR KWIKPEN) 100 UNIT/ML INJECT 50 UNITS SUBCUTANEOUSLY AT BEDTIME 45 mL 3   Insulin Pen Needle (PEN NEEDLES) 32G X 4 MM MISC USE AS INSTRUCTED WITH LANCETS. 400 each 3   meloxicam (MOBIC) 15 MG tablet Take 1 tablet (15 mg total) by mouth daily. 30 tablet 0   metFORMIN (GLUCOPHAGE) 850 MG tablet Take 1 tablet in the AM, and 2 tablets in the PM. 270 tablet 3   pantoprazole (PROTONIX) 40 MG tablet TAKE 1 TABLET DAILY 90 tablet 3   Semaglutide, 1  MG/DOSE, (OZEMPIC, 1 MG/DOSE,) 4 MG/3ML SOPN INJECT $RemoveBef'1MG'rrzVVAlGSS$  SUBCUTANEOUSLY  ONCE A WEEK. 9 mL 3   Vitamin D, Ergocalciferol, (DRISDOL) 1.25 MG (50000 UNIT) CAPS capsule Take 1 capsule (50,000 Units total) by mouth every 7 (seven) days. 12 capsule 0   Current Facility-Administered Medications on File Prior to Visit  Medication Dose Route Frequency Provider Last Rate Last Admin   ipratropium-albuterol (DUONEB) 0.5-2.5 (3) MG/3ML nebulizer solution 3 mL  3 mL Nebulization Q6H Copland, Jessica C, MD   3 mL at 11/15/16 1330   Allergies  Allergen Reactions   Hydrocodone-Acetaminophen Other (See Comments)    unknown   Oxycodone Hcl Other (See Comments)    unknown   Penicillins Other (See Comments)    unknown   Prednisone     Patient has noted allergy to prednisone but she says it was a questionable history of mild nausea with prednisone and nothing more    Family History  Problem Relation Age of Onset   Macular degeneration Mother    Heart disease Mother        syncope   Aortic stenosis Mother    Lung disease Father 66       mesothelioma   Cancer Maternal Aunt        stomach   Heart disease Paternal Uncle        cabg   Diabetes Paternal Uncle    Heart disease Paternal Uncle    Diabetes Paternal Uncle    Diabetes Paternal Uncle    Heart disease Paternal Uncle    Heart disease Paternal Uncle    Heart disease Paternal Uncle    Lung cancer Other        asbestos   Diabetes Paternal Grandmother    Cancer Other        lung   PE: BP (!) 120/58 (BP Location:  Left Arm, Patient Position: Sitting, Cuff Size: Normal)   Pulse 85   Ht $R'5\' 3"'iG$  (1.6 m)   Wt 259 lb 3.2 oz (117.6 kg)   SpO2 97%   BMI 45.92 kg/m  Wt Readings from Last 3 Encounters:  03/18/22 259 lb 3.2 oz (117.6 kg)  11/16/21 269 lb 12.8 oz (122.4 kg)  10/20/21 272 lb (123.4 kg)   Constitutional: overweight, in NAD Eyes: PERRLA, EOMI, no exophthalmos ENT: moist mucous membranes, no thyromegaly, no cervical  lymphadenopathy Cardiovascular: RRR, No MRG, + B LE edema Respiratory: CTA B Musculoskeletal: no deformities, strength intact in all 4 Skin: moist, warm, no rashes except stasis dermatitis of bilateral lower legs Neurological: no tremor with outstretched hands, DTR normal in all 4  ASSESSMENT: 1. DM2, insulin-dependent, uncontrolled, with complications - DR  2. Obesity class 3  3. HL  PLAN:  1. Patient with history of uncontrolled type 2 diabetes, insulin-dependent, with worse control in 2020, when she returned after an absence of 1.5 years.  At that time, she had a lot of stress and relaxed her diet and sugars were in the 200s to 400s.  We discussed about improving diet and also increased her insulin and her GLP-1 receptor agonist.  Sugars started to improve and at last visit HbA1c was excellent, at 5.6%.  We backed off her insulin dose at that time.  Sugars are dropping too low overnight, most being close to 70 and occasionally in the 50s and 60s. CGM interpretation: -At today's visit, we reviewed her CGM downloads: It appears that 89% of values are in target range (goal >70%), while 7% are higher than 180 (goal <25%), and 4% are lower than 70 (goal <4%).  The calculated average blood sugar is 116.  The projected HbA1c for the next 3 months (GMI) is 6.1%. -Reviewing the CGM trends, sugars appear to be in the lower half of the target interval overnight, and even occasionally dropping under 70.  During the day, sugars are higher, but still remain at goal.  They are dropping again after dinner.  We discussed that it appears that her Basaglar dose is still too high.  She did not adjust it down as advised at last visit.  We will do so today.  To avoid further hypoglycemia, I advised her to also reduce the NovoLog doses.  I advised her to continue to adjust the doses of insulin down as needed if she still sees blood sugars in the 70s or lower.  Alternatively, she can let me know before she comes back  next time, if her sugars stay low. - I advised her to:  Patient Instructions  Please continue: - Metformin 850 mg in am and 1700 mg at night - Ozempic 1 mg weekly  Please decrease: - Basaglar 36 units at bedtime - NovoLog 12-15 (18 ) 3x a day before meals  Please return in 4 months.  - we checked her HbA1c: 5.6% (still excellent) - advised to check sugars at different times of the day - 4x a day, rotating check times - advised for yearly eye exams >> she is UTD - she is due for annual labs.  She is preparing to schedule an annual physical exam with Dr. Etter Sjogren. - return to clinic in 4 months  2. Obesity class 3  -We will continue Ozempic 1 mg weekly which should also help with weight loss.  We are also trying to decrease her insulin dose, which should help with weight loss. -She lost  6 pounds before last visit and 10 lbs since then!  3. HL -Reviewed latest lipid panel from 12/2020: Fractions were all at goal: Lab Results  Component Value Date   CHOL 98 01/01/2021   HDL 48.70 01/01/2021   LDLCALC 20 01/01/2021   LDLDIRECT 64.0 04/21/2017   TRIG 147.0 01/01/2021   CHOLHDL 2 01/01/2021  -She continues on simvastatin-ezetimibe 40-10 mg daily and fenofibrate 160 mg daily without side effects -She is due for another lipid panel -plans to schedule an appointment with PCP soon  Philemon Kingdom, MD PhD [facility]

## 2022-03-23 ENCOUNTER — Ambulatory Visit (INDEPENDENT_AMBULATORY_CARE_PROVIDER_SITE_OTHER): Payer: Medicare Other | Admitting: Family Medicine

## 2022-03-23 ENCOUNTER — Encounter: Payer: Self-pay | Admitting: Family Medicine

## 2022-03-23 VITALS — BP 142/78 | HR 91 | Temp 98.5°F | Resp 18 | Ht 63.0 in | Wt 259.4 lb

## 2022-03-23 DIAGNOSIS — E11649 Type 2 diabetes mellitus with hypoglycemia without coma: Secondary | ICD-10-CM

## 2022-03-23 DIAGNOSIS — E782 Mixed hyperlipidemia: Secondary | ICD-10-CM

## 2022-03-23 DIAGNOSIS — I10 Essential (primary) hypertension: Secondary | ICD-10-CM

## 2022-03-23 DIAGNOSIS — E1165 Type 2 diabetes mellitus with hyperglycemia: Secondary | ICD-10-CM | POA: Diagnosis not present

## 2022-03-23 DIAGNOSIS — Z794 Long term (current) use of insulin: Secondary | ICD-10-CM | POA: Diagnosis not present

## 2022-03-23 DIAGNOSIS — E785 Hyperlipidemia, unspecified: Secondary | ICD-10-CM

## 2022-03-23 DIAGNOSIS — E1169 Type 2 diabetes mellitus with other specified complication: Secondary | ICD-10-CM

## 2022-03-23 LAB — COMPREHENSIVE METABOLIC PANEL
ALT: 8 U/L (ref 0–35)
AST: 13 U/L (ref 0–37)
Albumin: 3.9 g/dL (ref 3.5–5.2)
Alkaline Phosphatase: 27 U/L — ABNORMAL LOW (ref 39–117)
BUN: 25 mg/dL — ABNORMAL HIGH (ref 6–23)
CO2: 29 mEq/L (ref 19–32)
Calcium: 9.7 mg/dL (ref 8.4–10.5)
Chloride: 105 mEq/L (ref 96–112)
Creatinine, Ser: 1.06 mg/dL (ref 0.40–1.20)
GFR: 53.01 mL/min — ABNORMAL LOW (ref >60.00)
Glucose, Bld: 119 mg/dL — ABNORMAL HIGH (ref 70–99)
Potassium: 4.9 mEq/L (ref 3.5–5.1)
Sodium: 142 mEq/L (ref 135–145)
Total Bilirubin: 0.6 mg/dL (ref 0.2–1.2)
Total Protein: 5.9 g/dL — ABNORMAL LOW (ref 6.0–8.3)

## 2022-03-23 LAB — LIPID PANEL
Cholesterol: 123 mg/dL (ref 0–200)
HDL: 55 mg/dL (ref >39.00)
LDL Cholesterol: 39 mg/dL (ref 0–99)
NonHDL: 67.79
Total CHOL/HDL Ratio: 2
Triglycerides: 145 mg/dL (ref 0.0–149.0)
VLDL: 29 mg/dL (ref 0.0–40.0)

## 2022-03-23 LAB — CBC WITH DIFFERENTIAL/PLATELET
Basophils Absolute: 0.1 10*3/uL (ref 0.0–0.1)
Basophils Relative: 0.8 % (ref 0.0–3.0)
Eosinophils Absolute: 0.1 10*3/uL (ref 0.0–0.7)
Eosinophils Relative: 0.9 % (ref 0.0–5.0)
HCT: 41.3 % (ref 36.0–46.0)
Hemoglobin: 13.4 g/dL (ref 12.0–15.0)
Lymphocytes Relative: 18 % (ref 12.0–46.0)
Lymphs Abs: 1.2 10*3/uL (ref 0.7–4.0)
MCHC: 32.3 g/dL (ref 30.0–36.0)
MCV: 84.7 fl (ref 78.0–100.0)
Monocytes Absolute: 0.5 10*3/uL (ref 0.1–1.0)
Monocytes Relative: 7.3 % (ref 3.0–12.0)
Neutro Abs: 4.9 10*3/uL (ref 1.4–7.7)
Neutrophils Relative %: 73 % (ref 43.0–77.0)
Platelets: 224 10*3/uL (ref 150.0–400.0)
RBC: 4.88 Mil/uL (ref 3.87–5.11)
RDW: 16.3 % — ABNORMAL HIGH (ref 11.5–15.5)
WBC: 6.8 10*3/uL (ref 4.0–10.5)

## 2022-03-23 NOTE — Patient Instructions (Signed)

## 2022-03-23 NOTE — Progress Notes (Signed)
Subjective:   By signing my name below, I, Shehryar Baig, attest that this documentation has been prepared under the direction and in the presence of [name], DO  03/23/2022    Patient ID: Andrea Marks, female    DOB: 1951-01-04, 71 y.o.   MRN: [id number]  Chief Complaint  Patient presents with   Follow-up   Diabetes    Diabetes  Patient is in today for a follow up visit.   She seen her endocrinologist last week and reports her a1c was 5.6. She continues taking ozempic and reports losing weight while taking it. She currently weighs 259 lb's with a BMI of 45.95 kg/m^2. She continues taking  Lab Results  Component Value Date   HGBA1C 5.6 03/18/2022   She reports having a procedure on her left knee since her last visit. She describes the procedure as injecting numbing agent into her knee to manage her pain but she found no relief. She prefers not to have a surgical procedure to manage her pain. She is using a rolling walker to assist with mobility.  She continues having difficulty going up the stairs at her residence. She is interested in having a stair lift installed but thinks she cannot afford it.  She reports having injections in her eye and reports no new issues while having them. She notes her vision improved after receiving the injections.  She is due for a colonoscopy and is not interested in receiving them anymore.  She does not have the bivalent Covid-19 vaccine and is not interested in receiving it at this time.  She is due for the tetanus vaccine and is interested in receiving it at her pharmacy.    Past Medical History:  Diagnosis Date   Depression    Diabetes mellitus    GERD (gastroesophageal reflux disease)    Hyperlipidemia    Hypertension    Obesity     Past Surgical History:  Procedure Laterality Date   ABDOMINAL HYSTERECTOMY  1991   BSO   IR ABLATE LIVER CRYOABLATION  12/31/2021   IR RADIOLOGIST EVAL & MGMT  12/11/2021   IR  RADIOLOGIST EVAL & MGMT  01/07/2022   IR RADIOLOGIST EVAL & MGMT  02/15/2022    Family History  Problem Relation Age of Onset   Macular degeneration Mother    Heart disease Mother        syncope   Aortic stenosis Mother    Lung disease Father 72       mesothelioma   Cancer Maternal Aunt        stomach   Heart disease Paternal Uncle        cabg   Diabetes Paternal Uncle    Heart disease Paternal Uncle    Diabetes Paternal Uncle    Diabetes Paternal Uncle    Heart disease Paternal Uncle    Heart disease Paternal Uncle    Heart disease Paternal Uncle    Lung cancer Other        asbestos   Diabetes Paternal Grandmother    Cancer Other        lung    Social History   Socioeconomic History   Marital status: Widowed    Spouse name: Not on file   Number of children: 0   Years of education: 76   Highest education level: Some college, no degree  Occupational History   Occupation: retired from Surveyor, minerals: [employer] Use  Smoking status: Former    Types: Cigarettes    Quit date: 1995    Years since quitting: 28.4    Passive exposure: Past   Smokeless tobacco: Never  Vaping Use   Vaping Use: Never used  Substance and Sexual Activity   Alcohol use: No   Drug use: No   Sexual activity: Not Currently    Partners: Male  Other Topics Concern   Not on file  Social History Narrative   Lives with husband   Right Handed   Drinks 3-5 cups caffeine daily   Social Determinants of Health   Financial Resource Strain: Low Risk    Difficulty of Paying Living Expenses: Not hard at all  Food Insecurity: No Food Insecurity   Worried About Charity fundraiser in the Last Year: Never true   Arboriculturist in the Last Year: Never true  Transportation Needs: No Transportation Needs   Lack of Transportation (Medical): No   Lack of Transportation (Non-Medical): No  Physical Activity: Inactive   Days of Exercise per Week: 0 days   Minutes of Exercise per Session:  0 min  Stress: Stress Concern Present   Feeling of Stress : Very much  Social Connections: Socially Isolated   Frequency of Communication with Friends and Family: More than three times a week   Frequency of Social Gatherings with Friends and Family: More than three times a week   Attends Religious Services: Never   Marine scientist or Organizations: No   Attends Archivist Meetings: Never   Marital Status: Widowed  Human resources officer Violence: Not At Risk   Fear of Current or Ex-Partner: No   Emotionally Abused: No   Physically Abused: No   Sexually Abused: No    Outpatient Medications Prior to Visit  Medication Sig Dispense Refill   acetaminophen (TYLENOL) 650 MG CR tablet Take 650 mg by mouth 2 (two) times daily as needed for pain. For pain     aspirin 81 MG tablet Take 81 mg by mouth daily.     benazepril (LOTENSIN) 20 MG tablet TAKE 1 TABLET DAILY 90 tablet 0   Blood Glucose Monitoring Suppl (ONE TOUCH ULTRA SYSTEM KIT) W/DEVICE KIT 1 kit by Does not apply route once. 1 each 0   estradiol (ESTRACE) 1 MG tablet TAKE 1 TABLET DAILY 90 tablet 1   ezetimibe-simvastatin (VYTORIN) 10-40 MG tablet Take 1 tablet by mouth at bedtime. 90 tablet 1   fenofibrate 160 MG tablet TAKE 1 TABLET DAILY 90 tablet 1   FLUoxetine (PROZAC) 20 MG tablet Take 3 tablets (60 mg total) by mouth daily. 270 tablet 3   glucose blood (ONETOUCH ULTRA) test strip Use to check blood sugar 3 times a day 100 each 0   insulin aspart (NOVOLOG FLEXPEN) 100 UNIT/ML FlexPen Inject 12-15 Units into the skin 3 (three) times daily with meals. 45 mL 3   Insulin Glargine (BASAGLAR KWIKPEN) 100 UNIT/ML INJECT 36 UNITS SUBCUTANEOUSLY AT BEDTIME 45 mL 3   Insulin Pen Needle (PEN NEEDLES) 32G X 4 MM MISC USE AS INSTRUCTED WITH LANCETS. 400 each 3   meloxicam (MOBIC) 15 MG tablet Take 1 tablet (15 mg total) by mouth daily. 30 tablet 0   metFORMIN (GLUCOPHAGE) 850 MG tablet Take 1 tablet in the AM, and 2 tablets in the  PM. 270 tablet 3   pantoprazole (PROTONIX) 40 MG tablet TAKE 1 TABLET DAILY 90 tablet 3   Semaglutide, 1 MG/DOSE, (OZEMPIC, 1  MG/DOSE,) 4 MG/3ML SOPN INJECT 1MG SUBCUTANEOUSLY  ONCE A WEEK. 9 mL 3   Vitamin D, Ergocalciferol, (DRISDOL) 1.25 MG (50000 UNIT) CAPS capsule Take 1 capsule (50,000 Units total) by mouth every 7 (seven) days. 12 capsule 0   clonazePAM (KLONOPIN) 0.5 MG tablet 1/2-1 po qhs prn 20 tablet 1   Facility-Administered Medications Prior to Visit  Medication Dose Route Frequency Provider Last Rate Last Admin   ipratropium-albuterol (DUONEB) 0.5-2.5 (3) MG/3ML nebulizer solution 3 mL  3 mL Nebulization Q6H Copland, Jessica C, MD   3 mL at 11/15/16 1330    Allergies  Allergen Reactions   Hydrocodone-Acetaminophen Other (See Comments)    unknown   Oxycodone Hcl Other (See Comments)    unknown   Penicillins Other (See Comments)    unknown   Prednisone     Patient has noted allergy to prednisone but she says it was a questionable history of mild nausea with prednisone and nothing more     ROS     Objective:    Physical Exam Constitutional:      General: She is not in acute distress.    Appearance: Normal appearance. She is not ill-appearing.  HENT:     Head: Normocephalic and atraumatic.     Right Ear: External ear normal.     Left Ear: External ear normal.  Eyes:     Extraocular Movements: Extraocular movements intact.     Pupils: Pupils are equal, round, and reactive to light.  Cardiovascular:     Rate and Rhythm: Normal rate and regular rhythm.     Heart sounds: Normal heart sounds. No murmur heard.   No gallop.  Pulmonary:     Effort: Pulmonary effort is normal. No respiratory distress.     Breath sounds: Normal breath sounds. No wheezing or rales.  Skin:    General: Skin is warm and dry.  Neurological:     Mental Status: She is alert and oriented to person, place, and time.  Psychiatric:        Judgment: Judgment normal.    BP (!) 142/78 (BP  Location: Right Arm, Patient Position: Sitting, Cuff Size: Large)   Pulse 91   Temp 98.5 F (36.9 C) (Oral)   Resp 18   Ht _0  (1.6 m)   Wt 259 lb 6.4 oz (117.7 kg)   SpO2 97%   BMI 45.95 kg/m  Wt Readings from Last 3 Encounters:  03/23/22 259 lb 6.4 oz (117.7 kg)  03/18/22 259 lb 3.2 oz (117.6 kg)  11/16/21 269 lb 12.8 oz (122.4 kg)    Diabetic Foot Exam - Simple   No data filed    Lab Results  Component Value Date   WBC 6.8 03/23/2022   HGB 13.4 03/23/2022   HCT 41.3 03/23/2022   PLT 224.0 03/23/2022   GLUCOSE 119 (H) 03/23/2022   CHOL 123 03/23/2022   TRIG 145.0 03/23/2022   HDL 55.00 03/23/2022   LDLDIRECT 64.0 04/21/2017   LDLCALC 39 03/23/2022   ALT 8 03/23/2022   AST 13 03/23/2022   NA 142 03/23/2022   K 4.9 03/23/2022   CL 105 03/23/2022   CREATININE 1.06 03/23/2022   BUN 25 (H) 03/23/2022   CO2 29 03/23/2022   TSH 3.22 08/07/2015   INR 1.2 RATIO 04/19/2007   HGBA1C 5.6 03/18/2022   MICROALBUR 28.9 (H) 09/10/2019    Lab Results  Component Value Date   TSH 3.22 08/07/2015   Lab Results  Component Value  Date   WBC 6.8 03/23/2022   HGB 13.4 03/23/2022   HCT 41.3 03/23/2022   MCV 84.7 03/23/2022   PLT 224.0 03/23/2022   Lab Results  Component Value Date   NA 142 03/23/2022   K 4.9 03/23/2022   CO2 29 03/23/2022   GLUCOSE 119 (H) 03/23/2022   BUN 25 (H) 03/23/2022   CREATININE 1.06 03/23/2022   BILITOT 0.6 03/23/2022   ALKPHOS 27 (L) 03/23/2022   AST 13 03/23/2022   ALT 8 03/23/2022   PROT 5.9 (L) 03/23/2022   ALBUMIN 3.9 03/23/2022   CALCIUM 9.7 03/23/2022   ANIONGAP 8 03/04/2021   GFR 53.01 (L) 03/23/2022   Lab Results  Component Value Date   CHOL 123 03/23/2022   Lab Results  Component Value Date   HDL 55.00 03/23/2022   Lab Results  Component Value Date   LDLCALC 39 03/23/2022   Lab Results  Component Value Date   TRIG 145.0 03/23/2022   Lab Results  Component Value Date   CHOLHDL 2 03/23/2022   Lab Results   Component Value Date   HGBA1C 5.6 03/18/2022       Assessment & Plan:   Problem List Items Addressed This Visit       Unprioritized   Type 2 diabetes mellitus with hyperglycemia, with long-term current use of insulin ([place]) (Chronic)    Per endo       Essential hypertension (Chronic)    Well controlled, no changes to meds. Encouraged heart healthy diet such as the DASH diet and exercise as tolerated.        Hyperlipidemia associated with type 2 diabetes mellitus ([place])    Encourage heart healthy diet such as MIND or DASH diet, increase exercise, avoid trans fats, simple carbohydrates and processed foods, consider a krill or fish or flaxseed oil cap daily.        Other Visit Diagnoses     Type 2 diabetes mellitus with hypoglycemia without coma, with long-term current use of insulin ([facility])    -  Primary   Relevant Orders   Comprehensive metabolic panel (Completed)   Lipid panel (Completed)   CBC with Differential/Platelet (Completed)   Mixed hyperlipidemia       Relevant Orders   Comprehensive metabolic panel (Completed)   Lipid panel (Completed)   CBC with Differential/Platelet (Completed)        No orders of the defined types were placed in this encounter.   IAnn Held, DO, personally preformed the services described in this documentation.  All medical record entries made by the scribe were at my direction and in my presence.  I have reviewed the chart and discharge instructions (if applicable) and agree that the record reflects my personal performance and is accurate and complete. 03/23/2022   I,Shehryar Baig,acting as a scribe for [name], DO.,have documented all relevant documentation on the behalf of [name], DO,as directed by  [name], DO while in the presence of [name], DO.   [name], DO

## 2022-03-25 ENCOUNTER — Encounter: Payer: Self-pay | Admitting: Internal Medicine

## 2022-03-25 NOTE — Assessment & Plan Note (Signed)
Well controlled, no changes to meds. Encouraged heart healthy diet such as the DASH diet and exercise as tolerated.  °

## 2022-03-25 NOTE — Assessment & Plan Note (Signed)
Per endo °

## 2022-03-25 NOTE — Assessment & Plan Note (Signed)
Encourage heart healthy diet such as MIND or DASH diet, increase exercise, avoid trans fats, simple carbohydrates and processed foods, consider a krill or fish or flaxseed oil cap daily.  °

## 2022-03-26 ENCOUNTER — Other Ambulatory Visit: Payer: Self-pay | Admitting: Family Medicine

## 2022-03-26 DIAGNOSIS — Z780 Asymptomatic menopausal state: Secondary | ICD-10-CM

## 2022-03-26 DIAGNOSIS — F418 Other specified anxiety disorders: Secondary | ICD-10-CM

## 2022-04-26 ENCOUNTER — Other Ambulatory Visit: Payer: Self-pay | Admitting: Family Medicine

## 2022-04-26 DIAGNOSIS — I10 Essential (primary) hypertension: Secondary | ICD-10-CM

## 2022-05-12 ENCOUNTER — Ambulatory Visit: Payer: Medicare Other | Admitting: Podiatry

## 2022-05-20 ENCOUNTER — Telehealth: Payer: Self-pay | Admitting: Family Medicine

## 2022-05-20 NOTE — Telephone Encounter (Signed)
Left message for patient to call back and schedule Medicare Annual Wellness Visit (AWV).   Please offer to do virtually or by telephone.  Left office number and my jabber #[phone].  Last AWV:05/27/2021  Please schedule at anytime with Nurse Health Advisor.   

## 2022-05-21 ENCOUNTER — Ambulatory Visit: Payer: Medicare Other | Admitting: Podiatry

## 2022-05-24 ENCOUNTER — Encounter: Payer: Self-pay | Admitting: Podiatry

## 2022-05-24 ENCOUNTER — Ambulatory Visit (INDEPENDENT_AMBULATORY_CARE_PROVIDER_SITE_OTHER): Payer: Medicare Other | Admitting: Podiatry

## 2022-05-24 DIAGNOSIS — B351 Tinea unguium: Secondary | ICD-10-CM | POA: Diagnosis not present

## 2022-05-24 DIAGNOSIS — Z794 Long term (current) use of insulin: Secondary | ICD-10-CM

## 2022-05-24 DIAGNOSIS — E1151 Type 2 diabetes mellitus with diabetic peripheral angiopathy without gangrene: Secondary | ICD-10-CM

## 2022-05-24 DIAGNOSIS — M79676 Pain in unspecified toe(s): Secondary | ICD-10-CM | POA: Diagnosis not present

## 2022-05-24 NOTE — Progress Notes (Signed)
This patient returns to my office for at risk foot care.  This patient requires this care by a professional since this patient will be at risk due to having diabetes and history of thrombophlebitis.  This patient says her callus on her big toes is painful walking and wearing her shoes.  She is unable to self treat.  This patient presents for at risk foot care today.  General Appearance  Alert, conversant and in no acute stress.  Vascular  Dorsalis pedis and posterior tibial  pulses are not  palpable  Bilaterally  Due to swelling..  Capillary return is within normal limits  bilaterally. Temperature is within normal limits  bilaterally.  Neurologic  Senn-Weinstein monofilament wire test diminished  bilaterally. Muscle power within normal limits bilaterally.  Nails Thick disfigured discolored nails with subungual debris  from hallux to fifth toes bilaterally. No evidence of bacterial infection or drainage bilaterally.  Orthopedic  No limitations of motion  feet .  No crepitus or effusions noted.  No bony pathology or digital deformities noted.  Skin  normotropic skin with no porokeratosis noted bilaterally.  No signs of infections or ulcers noted.   Pinch callus  B/L.   No infection or drainage noted.  Onychomycosis  Pain in right toes  Pain in left toes    Consent was obtained for treatment procedures.   Mechanical debridement of nails 1-5  bilaterally performed with a nail nipper.  Filed with dremel without incident.     Return office visit   9  weeks                  Told patient to return for periodic foot care and evaluation due to potential at risk complications.   Clorene Nerio DPM  

## 2022-05-28 ENCOUNTER — Other Ambulatory Visit: Payer: Self-pay | Admitting: Family Medicine

## 2022-05-28 DIAGNOSIS — E785 Hyperlipidemia, unspecified: Secondary | ICD-10-CM

## 2022-05-28 DIAGNOSIS — K219 Gastro-esophageal reflux disease without esophagitis: Secondary | ICD-10-CM

## 2022-06-08 ENCOUNTER — Ambulatory Visit (INDEPENDENT_AMBULATORY_CARE_PROVIDER_SITE_OTHER): Payer: Self-pay

## 2022-06-08 DIAGNOSIS — Z0000 Encounter for general adult medical examination without abnormal findings: Secondary | ICD-10-CM

## 2022-06-08 NOTE — Patient Instructions (Addendum)
Andrea Marks , Thank you for taking time to come for your Medicare Wellness Visit. I appreciate your ongoing commitment to your health goals. Please review the following plan we discussed and let me know if I can assist you in the future.   Screening recommendations/referrals: Colonoscopy: pt declined  Mammogram: pt declined  Bone Density: pt declined  Recommended yearly ophthalmology/optometry visit for glaucoma screening and checkup Recommended yearly dental visit for hygiene and checkup  Vaccinations: Influenza vaccine: declined  Pneumococcal vaccine: Up to date Tdap vaccine: due and discussed  Shingles vaccine: completed 5/12,  05/16/20   Covid-19:completed 3/7, 4/7, 09/22/20 & 03/19/21  Advanced directives: Advance directive discussed with you today. I have provided a copy for you to complete at home and have notarized. Once this is complete please bring a copy in to our office so we can scan it into your chart.  Conditions/risks identified: none at this time   Next appointment: Follow up in one year for your annual wellness visit    Preventive Care 65 Years and Older, Female Preventive care refers to lifestyle choices and visits with your health care provider that can promote health and wellness. What does preventive care include? A yearly physical exam. This is also called an annual well check. Dental exams once or twice a year. Routine eye exams. Ask your health care provider how often you should have your eyes checked. Personal lifestyle choices, including: Daily care of your teeth and gums. Regular physical activity. Eating a healthy diet. Avoiding tobacco and drug use. Limiting alcohol use. Practicing safe sex. Taking low-dose aspirin every day. Taking vitamin and mineral supplements as recommended by your health care provider. What happens during an annual well check? The services and screenings done by your health care provider during your annual well check will  depend on your age, overall health, lifestyle risk factors, and family history of disease. Counseling  Your health care provider may ask you questions about your: Alcohol use. Tobacco use. Drug use. Emotional well-being. Home and relationship well-being. Sexual activity. Eating habits. History of falls. Memory and ability to understand (cognition). Work and work Astronomer. Reproductive health. Screening  You may have the following tests or measurements: Height, weight, and BMI. Blood pressure. Lipid and cholesterol levels. These may be checked every 5 years, or more frequently if you are over 72 years old. Skin check. Lung cancer screening. You may have this screening every year starting at age 73 if you have a 30-pack-year history of smoking and currently smoke or have quit within the past 15 years. Fecal occult blood test (FOBT) of the stool. You may have this test every year starting at age 40. Flexible sigmoidoscopy or colonoscopy. You may have a sigmoidoscopy every 5 years or a colonoscopy every 10 years starting at age 22. Hepatitis C blood test. Hepatitis B blood test. Sexually transmitted disease (STD) testing. Diabetes screening. This is done by checking your blood sugar (glucose) after you have not eaten for a while (fasting). You may have this done every 1-3 years. Bone density scan. This is done to screen for osteoporosis. You may have this done starting at age 87. Mammogram. This may be done every 1-2 years. Talk to your health care provider about how often you should have regular mammograms. Talk with your health care provider about your test results, treatment options, and if necessary, the need for more tests. Vaccines  Your health care provider may recommend certain vaccines, such as: Influenza vaccine. This is recommended  every year. Tetanus, diphtheria, and acellular pertussis (Tdap, Td) vaccine. You may need a Td booster every 10 years. Zoster vaccine. You may  need this after age 39. Pneumococcal 13-valent conjugate (PCV13) vaccine. One dose is recommended after age 64. Pneumococcal polysaccharide (PPSV23) vaccine. One dose is recommended after age 57. Talk to your health care provider about which screenings and vaccines you need and how often you need them. This information is not intended to replace advice given to you by your health care provider. Make sure you discuss any questions you have with your health care provider. Document Released: 11/14/2015 Document Revised: 07/07/2016 Document Reviewed: 08/19/2015 Elsevier Interactive Patient Education  2017 [place] Prevention in the Home Falls can cause injuries. They can happen to people of all ages. There are many things you can do to make your home safe and to help prevent falls. What can I do on the outside of my home? Regularly fix the edges of walkways and driveways and fix any cracks. Remove anything that might make you trip as you walk through a door, such as a raised step or threshold. Trim any bushes or trees on the path to your home. Use bright outdoor lighting. Clear any walking paths of anything that might make someone trip, such as rocks or tools. Regularly check to see if handrails are loose or broken. Make sure that both sides of any steps have handrails. Any raised decks and porches should have guardrails on the edges. Have any leaves, snow, or ice cleared regularly. Use sand or salt on walking paths during winter. Clean up any spills in your garage right away. This includes oil or grease spills. What can I do in the bathroom? Use night lights. Install grab bars by the toilet and in the tub and shower. Do not use towel bars as grab bars. Use non-skid mats or decals in the tub or shower. If you need to sit down in the shower, use a plastic, non-slip stool. Keep the floor dry. Clean up any water that spills on the floor as soon as it happens. Remove soap buildup in  the tub or shower regularly. Attach bath mats securely with double-sided non-slip rug tape. Do not have throw rugs and other things on the floor that can make you trip. What can I do in the bedroom? Use night lights. Make sure that you have a light by your bed that is easy to reach. Do not use any sheets or blankets that are too big for your bed. They should not hang down onto the floor. Have a firm chair that has side arms. You can use this for support while you get dressed. Do not have throw rugs and other things on the floor that can make you trip. What can I do in the kitchen? Clean up any spills right away. Avoid walking on wet floors. Keep items that you use a lot in easy-to-reach places. If you need to reach something above you, use a strong step stool that has a grab bar. Keep electrical cords out of the way. Do not use floor polish or wax that makes floors slippery. If you must use wax, use non-skid floor wax. Do not have throw rugs and other things on the floor that can make you trip. What can I do with my stairs? Do not leave any items on the stairs. Make sure that there are handrails on both sides of the stairs and use them. Fix handrails that are broken  or loose. Make sure that handrails are as long as the stairways. Check any carpeting to make sure that it is firmly attached to the stairs. Fix any carpet that is loose or worn. Avoid having throw rugs at the top or bottom of the stairs. If you do have throw rugs, attach them to the floor with carpet tape. Make sure that you have a light switch at the top of the stairs and the bottom of the stairs. If you do not have them, ask someone to add them for you. What else can I do to help prevent falls? Wear shoes that: Do not have high heels. Have rubber bottoms. Are comfortable and fit you well. Are closed at the toe. Do not wear sandals. If you use a stepladder: Make sure that it is fully opened. Do not climb a closed  stepladder. Make sure that both sides of the stepladder are locked into place. Ask someone to hold it for you, if possible. Clearly mark and make sure that you can see: Any grab bars or handrails. First and last steps. Where the edge of each step is. Use tools that help you move around (mobility aids) if they are needed. These include: Canes. Walkers. Scooters. Crutches. Turn on the lights when you go into a dark area. Replace any light bulbs as soon as they burn out. Set up your furniture so you have a clear path. Avoid moving your furniture around. If any of your floors are uneven, fix them. If there are any pets around you, be aware of where they are. Review your medicines with your doctor. Some medicines can make you feel dizzy. This can increase your chance of falling. Ask your doctor what other things that you can do to help prevent falls. This information is not intended to replace advice given to you by your health care provider. Make sure you discuss any questions you have with your health care provider. Document Released: 08/14/2009 Document Revised: 03/25/2016 Document Reviewed: 11/22/2014 Elsevier Interactive Patient Education  2017 [employer].

## 2022-06-08 NOTE — Progress Notes (Signed)
Virtual Visit via Telephone Note  I connected with  Andrea Marks on 06/08/22 at  1:00 PM EDT by telephone and verified that I am speaking with the correct person using two identifiers.  Medicare Annual Wellness visit completed telephonically due to Covid-19 pandemic.   Persons participating in this call: This Health Coach and this patient.   Location: Patient: home Provider: office   I discussed the limitations, risks, security and privacy concerns of performing an evaluation and management service by telephone and the availability of in person appointments. The patient expressed understanding and agreed to proceed.  Unable to perform video visit due to video visit attempted and failed and/or patient does not have video capability.   Some vital signs may be absent or patient reported.   Willette Brace, LPN   Subjective:   Andrea Marks is a 71 y.o. female who presents for Medicare Annual (Subsequent) preventive examination.  Review of Systems     Cardiac Risk Factors include: advanced age (>58mn, >>76women);obesity (BMI >30kg/m2);hypertension;diabetes mellitus;dyslipidemia     Objective:    There were no vitals filed for this visit. There is no height or weight on file to calculate BMI.     06/08/2022    1:07 PM 10/13/2021    4:19 PM 05/27/2021    9:06 AM 02/27/2021    3:56 PM 02/27/2021    3:11 AM 05/16/2020   11:11 AM 06/29/2018   11:10 AM  Advanced Directives  Does Patient Have a Medical Advance Directive? Yes No No  No No No  Type of Advance Directive Living will        Would patient like information on creating a medical advance directive?  No - Patient declined Yes (MAU/Ambulatory/Procedural Areas - Information given) No - Patient declined  Yes (MAU/Ambulatory/Procedural Areas - Information given) No - Patient declined    Current Medications (verified) Outpatient Encounter Medications as of 06/08/2022  Medication Sig   acetaminophen  (TYLENOL) 650 MG CR tablet Take 650 mg by mouth 2 (two) times daily as needed for pain. For pain   aspirin 81 MG tablet Take 81 mg by mouth daily.   benazepril (LOTENSIN) 20 MG tablet TAKE 1 TABLET DAILY   Blood Glucose Monitoring Suppl (ONE TOUCH ULTRA SYSTEM KIT) W/DEVICE KIT 1 kit by Does not apply route once.   estradiol (ESTRACE) 1 MG tablet TAKE 1 TABLET DAILY   ezetimibe-simvastatin (VYTORIN) 10-40 MG tablet TAKE 1 TABLET AT BEDTIME   fenofibrate 160 MG tablet TAKE 1 TABLET DAILY   FLUoxetine (PROZAC) 20 MG tablet TAKE 3 TABLETS DAILY   glucose blood (ONETOUCH ULTRA) test strip Use to check blood sugar 3 times a day   insulin aspart (NOVOLOG FLEXPEN) 100 UNIT/ML FlexPen Inject 12-15 Units into the skin 3 (three) times daily with meals.   Insulin Glargine (BASAGLAR KWIKPEN) 100 UNIT/ML INJECT 36 UNITS SUBCUTANEOUSLY AT BEDTIME   Insulin Pen Needle (PEN NEEDLES) 32G X 4 MM MISC USE AS INSTRUCTED WITH LANCETS.   meloxicam (MOBIC) 15 MG tablet Take 1 tablet (15 mg total) by mouth daily.   metFORMIN (GLUCOPHAGE) 850 MG tablet Take 1 tablet in the AM, and 2 tablets in the PM.   pantoprazole (PROTONIX) 40 MG tablet TAKE 1 TABLET DAILY   Semaglutide, 1 MG/DOSE, (OZEMPIC, 1 MG/DOSE,) 4 MG/3ML SOPN INJECT 1MG SUBCUTANEOUSLY  ONCE A WEEK.   [DISCONTINUED] Vitamin D, Ergocalciferol, (DRISDOL) 1.25 MG (50000 UNIT) CAPS capsule Take 1 capsule (50,000 Units total) by mouth every  7 (seven) days.   Facility-Administered Encounter Medications as of 06/08/2022  Medication   ipratropium-albuterol (DUONEB) 0.5-2.5 (3) MG/3ML nebulizer solution 3 mL    Allergies (verified) Hydrocodone-acetaminophen, Oxycodone hcl, Penicillins, and Prednisone   History: Past Medical History:  Diagnosis Date   Depression    Diabetes mellitus    GERD (gastroesophageal reflux disease)    Hyperlipidemia    Hypertension    Obesity    Past Surgical History:  Procedure Laterality Date   ABDOMINAL HYSTERECTOMY  1991    BSO   IR ABLATE LIVER CRYOABLATION  12/31/2021   IR RADIOLOGIST EVAL & MGMT  12/11/2021   IR RADIOLOGIST EVAL & MGMT  01/07/2022   IR RADIOLOGIST EVAL & MGMT  02/15/2022   Family History  Problem Relation Age of Onset   Macular degeneration Mother    Heart disease Mother        syncope   Aortic stenosis Mother    Lung disease Father 43       mesothelioma   Cancer Maternal Aunt        stomach   Heart disease Paternal Uncle        cabg   Diabetes Paternal Uncle    Heart disease Paternal Uncle    Diabetes Paternal Uncle    Diabetes Paternal Uncle    Heart disease Paternal Uncle    Heart disease Paternal Uncle    Heart disease Paternal Uncle    Lung cancer Other        asbestos   Diabetes Paternal Grandmother    Cancer Other        lung   Social History   Socioeconomic History   Marital status: Widowed    Spouse name: Not on file   Number of children: 0   Years of education: 82   Highest education level: Some college, no degree  Occupational History   Occupation: retired from [employer]: RETIRED  Tobacco Use   Smoking status: Former    Types: Cigarettes    Quit date: 1995    Years since quitting: 28.6    Passive exposure: Past   Smokeless tobacco: Never  Vaping Use   Vaping Use: Never used  Substance and Sexual Activity   Alcohol use: No   Drug use: No   Sexual activity: Not Currently    Partners: Male  Other Topics Concern   Not on file  Social History Narrative   Lives with husband   Right Handed   Drinks 3-5 cups caffeine daily   Social Determinants of Health   Financial Resource Strain: Low Risk  (06/08/2022)   Overall Financial Resource Strain (CARDIA)    Difficulty of Paying Living Expenses: Not hard at all  Food Insecurity: No Food Insecurity (06/08/2022)   Hunger Vital Sign    Worried About Running Out of Food in the Last Year: Never true    [place] in the Last Year: Never true  Transportation Needs: No Transportation Needs  (06/08/2022)   PRAPARE - Hydrologist (Medical): No    Lack of Transportation (Non-Medical): No  Physical Activity: Inactive (06/08/2022)   Exercise Vital Sign    Days of Exercise per Week: 0 days    Minutes of Exercise per Session: 0 min  Stress: Stress Concern Present (06/08/2022)   [place]    Feeling of Stress : Rather much  Social Connections: Socially Isolated (  06/08/2022)   Social Connection and Isolation Panel [NHANES]    Frequency of Communication with Friends and Family: More than three times a week    Frequency of Social Gatherings with Friends and Family: Twice a week    Attends Religious Services: Never    Marine scientist or Organizations: No    Attends Archivist Meetings: Never    Marital Status: Widowed    Tobacco Counseling Counseling given: Not Answered   Clinical Intake:  Pre-visit preparation completed: Yes  Pain : No/denies pain     BMI - recorded: 45.96 Nutritional Status: BMI > 30  Obese Diabetes: Yes CBG done?: Yes (61 per pt before meal after 83) CBG resulted in Enter/ Edit results?: No Did pt. bring in CBG monitor from home?: No  How often do you need to have someone help you when you read instructions, pamphlets, or other written materials from your doctor or pharmacy?: 1 - Never  Diabetic?Nutrition Risk Assessment:  Has the patient had any N/V/D within the last 2 months?  No  Does the patient have any non-healing wounds?  No  Has the patient had any unintentional weight loss or weight gain?  No   Diabetes:  Is the patient diabetic?  Yes  If diabetic, was a CBG obtained today?  Yes  Did the patient bring in their glucometer from home?  No  How often do you monitor your CBG's? daily.   Financial Strains and Diabetes Management:  Are you having any financial strains with the device, your supplies or your medication? No .  Does the  patient want to be seen by Chronic Care Management for management of their diabetes?  No  Would the patient like to be referred to a Nutritionist or for Diabetic Management?  No   Diabetic Exams:  Diabetic Eye Exam: Completed 01/05/22 Diabetic Foot Exam: Completed 03/17/22   Interpreter Needed?: No  Information entered by :: Charlott Rakes, LPN   Activities of Daily Living    06/08/2022    1:08 PM 06/01/2022    9:14 AM  In your present state of health, do you have any difficulty performing the following activities:  Hearing? 0 0  Vision? 0 1  Difficulty concentrating or making decisions? 0 0  Walking or climbing stairs? 1 1  Comment slowly   Dressing or bathing? 0 0  Doing errands, shopping? 0 1  Preparing Food and eating ? N N  Using the Toilet? N N  In the past six months, have you accidently leaked urine? Y Y  Comment at times wears a pad   Do you have problems with loss of bowel control? N N  Managing your Medications? N N  Managing your Finances? N N  Housekeeping or managing your Housekeeping? N Y    Patient Care Team: Carollee Herter, Alferd Apa, DO as PCP - General Gardiner Barefoot, DPM as Consulting Physician (Podiatry) Jola Schmidt, MD as Consulting Physician (Ophthalmology) Philemon Kingdom, MD as Consulting Physician (Internal Medicine) Lyndal Pulley, DO as Consulting Physician (Family Medicine) Sherlynn Stalls, MD as Consulting Physician (Ophthalmology)  Indicate any recent Medical Services you may have received from other than [facility] providers in the past year (date may be approximate).     Assessment:   This is a routine wellness examination for Andrea Marks.  Hearing/Vision screen Hearing Screening - Comments:: Pt denies any hearing issues  Vision Screening - Comments:: Pt follows up with Dr Valetta Close and Dr Baird Cancer   Dietary  issues and exercise activities discussed: Current Exercise Habits: The patient does not participate in regular exercise at present   Goals  Addressed             This Visit's Progress    Patient Stated       None at this time        Depression Screen    06/08/2022    1:06 PM 10/13/2021    2:03 PM 09/29/2021    4:13 PM 05/27/2021    9:09 AM 03/19/2021   11:16 AM 05/16/2020   11:21 AM 12/19/2018   11:51 AM  PHQ 2/9 Scores  PHQ - 2 Score _0 PHQ- 9 Score  _1 Fall Risk    06/08/2022    1:08 PM 06/01/2022    9:14 AM 10/13/2021    4:17 PM 05/27/2021    9:09 AM 05/16/2020   11:21 AM  Fall Risk   Falls in the past year? 1 1 0 0 0  Number falls in past yr: 1 0 0 0 0  Injury with Fall? 0 0 0 0 0  Risk for fall due to : Impaired vision;Impaired mobility;Impaired balance/gait  No Fall Risks    Risk for fall due to: Comment left knee      Follow up Falls prevention discussed  Falls evaluation completed;Education provided;Falls prevention discussed Falls prevention discussed Education provided;Falls prevention discussed    FALL RISK PREVENTION PERTAINING TO THE HOME:  Any stairs in or around the home? Yes  If so, are there any without handrails? No  Home free of loose throw rugs in walkways, pet beds, electrical cords, etc? Yes  Adequate lighting in your home to reduce risk of falls? Yes   ASSISTIVE DEVICES UTILIZED TO PREVENT FALLS:  Life alert? Yes  Use of a cane, walker or w/c? Yes  Grab bars in the bathroom? Yes  Shower chair or bench in shower? No  Elevated toilet seat or a handicapped toilet? Yes   TIMED UP AND GO:  Was the test performed? No .   Cognitive Function: declined         Immunizations Immunization History  Administered Date(s) Administered   PFIZER Comirnaty(Gray Top)Covid-19 Tri-Sucrose Vaccine 03/19/2021   PFIZER(Purple Top)SARS-COV-2 Vaccination 01/06/2020, 02/06/2020, 09/22/2020   Pneumococcal Conjugate-13 08/27/2016   Pneumococcal Polysaccharide-23 05/08/2003, 11/10/2009, 12/15/2018   Td 05/08/2003   Tdap 06/08/2011   Zoster Recombinat (Shingrix) 03/12/2020,  05/16/2020, 05/16/2020   Zoster, Live 02/10/2012    TDAP status: Due, Education has been provided regarding the importance of this vaccine. Advised may receive this vaccine at local pharmacy or Health Dept. Aware to provide a copy of the vaccination record if obtained from local pharmacy or Health Dept. Verbalized acceptance and understanding.  Flu Vaccine status: Declined, Education has been provided regarding the importance of this vaccine but patient still declined. Advised may receive this vaccine at local pharmacy or Health Dept. Aware to provide a copy of the vaccination record if obtained from local pharmacy or Health Dept. Verbalized acceptance and understanding.  Pneumococcal vaccine status: Up to date  Covid-19 vaccine status: Completed vaccines  Qualifies for Shingles Vaccine? Yes   Zostavax completed Yes   Shingrix Completed?: Yes  Screening Tests Health Maintenance  Topic Date Due   COVID-19 Vaccine (5 - Pfizer series) 05/14/2021   TETANUS/TDAP  06/07/2021   COLONOSCOPY (Pts 45-63yr Insurance coverage will need to be confirmed)  03/24/2023 (Originally 02/16/2021)   MAMMOGRAM  12/20/2023 (Originally 02/24/2012)   DEXA SCAN  12/20/2023 (Originally 02/19/2016)   Hepatitis C Screening  12/20/2023 (Originally 02/18/1969)   INFLUENZA VACCINE  08/21/2024 (Originally 06/01/2022)   HEMOGLOBIN A1C  09/18/2022   OPHTHALMOLOGY EXAM  01/06/2023   FOOT EXAM  03/18/2023   Pneumonia Vaccine 6+ Years old  Completed   Zoster Vaccines- Shingrix  Completed   HPV VACCINES  Aged Out    Health Maintenance  Health Maintenance Due  Topic Date Due   COVID-19 Vaccine (5 - Pfizer series) 05/14/2021   TETANUS/TDAP  06/07/2021    Colorectal cancer screening: No longer required.  Per pt   Mammogram status: No longer required due to per pt .      Additional Screening:  Hepatitis C Screening: does qualify;  Vision Screening: Recommended annual ophthalmology exams for early detection of  glaucoma and other disorders of the eye. Is the patient up to date with their annual eye exam?  Yes  Who is the provider or what is the name of the office in which the patient attends annual eye exams? Dr Valetta Close  If pt is not established with a provider, would they like to be referred to a provider to establish care? No .   Dental Screening: Recommended annual dental exams for proper oral hygiene  Community Resource Referral / Chronic Care Management: CRR required this visit?  No   CCM required this visit?  No      Plan:     I have personally reviewed and noted the following in the patient's chart:   Medical and social history Use of alcohol, tobacco or illicit drugs  Current medications and supplements including opioid prescriptions.  Functional ability and status Nutritional status Physical activity Advanced directives List of other physicians Hospitalizations, surgeries, and ER visits in previous 12 months Vitals Screenings to include cognitive, depression, and falls Referrals and appointments  In addition, I have reviewed and discussed with patient certain preventive protocols, quality metrics, and best practice recommendations. A written personalized care plan for preventive services as well as general preventive health recommendations were provided to patient.     Willette Brace, LPN   0/11/2377   Nurse Notes: None

## 2022-06-14 NOTE — Progress Notes (Signed)
Virtual Visit via Telephone Note  I connected with  Andrea Marks on 06/14/22 at  1:00 PM EDT by telephone and verified that I am speaking with the correct person using two identifiers.  Medicare Annual Wellness visit completed telephonically due to Covid-19 pandemic.   Persons participating in this call: This Health Coach and this patient.   Location: Patient: home Provider: office   I discussed the limitations, risks, security and privacy concerns of performing an evaluation and management service by telephone and the availability of in person appointments. The patient expressed understanding and agreed to proceed.  Unable to perform video visit due to video visit attempted and failed and/or patient does not have video capability.   Some vital signs may be absent or patient reported.   Willette Brace, LPN   Subjective:   Andrea Marks is a 71 y.o. female who presents for Medicare Annual (Subsequent) preventive examination.  Review of Systems     Cardiac Risk Factors include: advanced age (>59men, >8 women);obesity (BMI >30kg/m2);hypertension;diabetes mellitus;dyslipidemia     Objective:    There were no vitals filed for this visit. There is no height or weight on file to calculate BMI.     06/08/2022    1:07 PM 10/13/2021    4:19 PM 05/27/2021    9:06 AM 02/27/2021    3:56 PM 02/27/2021    3:11 AM 05/16/2020   11:11 AM 06/29/2018   11:10 AM  Advanced Directives  Does Patient Have a Medical Advance Directive? Yes No No  No No No  Type of Advance Directive Living will        Would patient like information on creating a medical advance directive?  No - Patient declined Yes (MAU/Ambulatory/Procedural Areas - Information given) No - Patient declined  Yes (MAU/Ambulatory/Procedural Areas - Information given) No - Patient declined    Current Medications (verified) Outpatient Encounter Medications as of 06/08/2022  Medication Sig   acetaminophen  (TYLENOL) 650 MG CR tablet Take 650 mg by mouth 2 (two) times daily as needed for pain. For pain   aspirin 81 MG tablet Take 81 mg by mouth daily.   benazepril (LOTENSIN) 20 MG tablet TAKE 1 TABLET DAILY   Blood Glucose Monitoring Suppl (ONE TOUCH ULTRA SYSTEM KIT) W/DEVICE KIT 1 kit by Does not apply route once.   estradiol (ESTRACE) 1 MG tablet TAKE 1 TABLET DAILY   ezetimibe-simvastatin (VYTORIN) 10-40 MG tablet TAKE 1 TABLET AT BEDTIME   fenofibrate 160 MG tablet TAKE 1 TABLET DAILY   FLUoxetine (PROZAC) 20 MG tablet TAKE 3 TABLETS DAILY   glucose blood (ONETOUCH ULTRA) test strip Use to check blood sugar 3 times a day   insulin aspart (NOVOLOG FLEXPEN) 100 UNIT/ML FlexPen Inject 12-15 Units into the skin 3 (three) times daily with meals.   Insulin Glargine (BASAGLAR KWIKPEN) 100 UNIT/ML INJECT 36 UNITS SUBCUTANEOUSLY AT BEDTIME   Insulin Pen Needle (PEN NEEDLES) 32G X 4 MM MISC USE AS INSTRUCTED WITH LANCETS.   meloxicam (MOBIC) 15 MG tablet Take 1 tablet (15 mg total) by mouth daily.   metFORMIN (GLUCOPHAGE) 850 MG tablet Take 1 tablet in the AM, and 2 tablets in the PM.   pantoprazole (PROTONIX) 40 MG tablet TAKE 1 TABLET DAILY   Semaglutide, 1 MG/DOSE, (OZEMPIC, 1 MG/DOSE,) 4 MG/3ML SOPN INJECT $RemoveBef'1MG'HlNjZqdjHj$  SUBCUTANEOUSLY  ONCE A WEEK.   [DISCONTINUED] Vitamin D, Ergocalciferol, (DRISDOL) 1.25 MG (50000 UNIT) CAPS capsule Take 1 capsule (50,000 Units total) by mouth every  7 (seven) days.   Facility-Administered Encounter Medications as of 06/08/2022  Medication   ipratropium-albuterol (DUONEB) 0.5-2.5 (3) MG/3ML nebulizer solution 3 mL    Allergies (verified) Hydrocodone-acetaminophen, Oxycodone hcl, Penicillins, and Prednisone   History: Past Medical History:  Diagnosis Date   Depression    Diabetes mellitus    GERD (gastroesophageal reflux disease)    Hyperlipidemia    Hypertension    Obesity    Past Surgical History:  Procedure Laterality Date   ABDOMINAL HYSTERECTOMY  1991    BSO   IR ABLATE LIVER CRYOABLATION  12/31/2021   IR RADIOLOGIST EVAL & MGMT  12/11/2021   IR RADIOLOGIST EVAL & MGMT  01/07/2022   IR RADIOLOGIST EVAL & MGMT  02/15/2022   Family History  Problem Relation Age of Onset   Macular degeneration Mother    Heart disease Mother        syncope   Aortic stenosis Mother    Lung disease Father 43       mesothelioma   Cancer Maternal Aunt        stomach   Heart disease Paternal Uncle        cabg   Diabetes Paternal Uncle    Heart disease Paternal Uncle    Diabetes Paternal Uncle    Diabetes Paternal Uncle    Heart disease Paternal Uncle    Heart disease Paternal Uncle    Heart disease Paternal Uncle    Lung cancer Other        asbestos   Diabetes Paternal Grandmother    Cancer Other        lung   Social History   Socioeconomic History   Marital status: Widowed    Spouse name: Not on file   Number of children: 0   Years of education: 82   Highest education level: Some college, no degree  Occupational History   Occupation: retired from [employer]: RETIRED  Tobacco Use   Smoking status: Former    Types: Cigarettes    Quit date: 1995    Years since quitting: 28.6    Passive exposure: Past   Smokeless tobacco: Never  Vaping Use   Vaping Use: Never used  Substance and Sexual Activity   Alcohol use: No   Drug use: No   Sexual activity: Not Currently    Partners: Male  Other Topics Concern   Not on file  Social History Narrative   Lives with husband   Right Handed   Drinks 3-5 cups caffeine daily   Social Determinants of Health   Financial Resource Strain: Low Risk  (06/08/2022)   Overall Financial Resource Strain (CARDIA)    Difficulty of Paying Living Expenses: Not hard at all  Food Insecurity: No Food Insecurity (06/08/2022)   Hunger Vital Sign    Worried About Running Out of Food in the Last Year: Never true    [place] in the Last Year: Never true  Transportation Needs: No Transportation Needs  (06/08/2022)   PRAPARE - Hydrologist (Medical): No    Lack of Transportation (Non-Medical): No  Physical Activity: Inactive (06/08/2022)   Exercise Vital Sign    Days of Exercise per Week: 0 days    Minutes of Exercise per Session: 0 min  Stress: Stress Concern Present (06/08/2022)   [place]    Feeling of Stress : Rather much  Social Connections: Socially Isolated (  06/08/2022)   Social Connection and Isolation Panel [NHANES]    Frequency of Communication with Friends and Family: More than three times a week    Frequency of Social Gatherings with Friends and Family: Twice a week    Attends Religious Services: Never    Marine scientist or Organizations: No    Attends Archivist Meetings: Never    Marital Status: Widowed    Tobacco Counseling Counseling given: Not Answered   Clinical Intake:  Pre-visit preparation completed: Yes  Pain : No/denies pain     BMI - recorded: 45.96 Nutritional Status: BMI > 30  Obese Diabetes: Yes CBG done?: Yes (61 per pt before meal after 83) CBG resulted in Enter/ Edit results?: No Did pt. bring in CBG monitor from home?: No  How often do you need to have someone help you when you read instructions, pamphlets, or other written materials from your doctor or pharmacy?: 1 - Never  Diabetic?Nutrition Risk Assessment:  Has the patient had any N/V/D within the last 2 months?  No  Does the patient have any non-healing wounds?  No  Has the patient had any unintentional weight loss or weight gain?  No   Diabetes:  Is the patient diabetic?  Yes  If diabetic, was a CBG obtained today?  Yes  Did the patient bring in their glucometer from home?  No  How often do you monitor your CBG's? daily.   Financial Strains and Diabetes Management:  Are you having any financial strains with the device, your supplies or your medication? No .  Does the  patient want to be seen by Chronic Care Management for management of their diabetes?  No  Would the patient like to be referred to a Nutritionist or for Diabetic Management?  No   Diabetic Exams:  Diabetic Eye Exam: Completed 01/05/22 Diabetic Foot Exam: Completed 03/17/22   Interpreter Needed?: No  Information entered by :: Charlott Rakes, LPN   Activities of Daily Living    06/08/2022    1:08 PM 06/01/2022    9:14 AM  In your present state of health, do you have any difficulty performing the following activities:  Hearing? 0 0  Vision? 0 1  Difficulty concentrating or making decisions? 0 0  Walking or climbing stairs? 1 1  Comment slowly   Dressing or bathing? 0 0  Doing errands, shopping? 0 1  Preparing Food and eating ? N N  Using the Toilet? N N  In the past six months, have you accidently leaked urine? Y Y  Comment at times wears a pad   Do you have problems with loss of bowel control? N N  Managing your Medications? N N  Managing your Finances? N N  Housekeeping or managing your Housekeeping? N Y    Patient Care Team: Carollee Herter, Alferd Apa, DO as PCP - General Gardiner Barefoot, DPM as Consulting Physician (Podiatry) Jola Schmidt, MD as Consulting Physician (Ophthalmology) Philemon Kingdom, MD as Consulting Physician (Internal Medicine) Lyndal Pulley, DO as Consulting Physician (Family Medicine) Sherlynn Stalls, MD as Consulting Physician (Ophthalmology)  Indicate any recent Medical Services you may have received from other than [facility] providers in the past year (date may be approximate).     Assessment:   This is a routine wellness examination for Andrea Marks.  Hearing/Vision screen Hearing Screening - Comments:: Pt denies any hearing issues  Vision Screening - Comments:: Pt follows up with Dr Valetta Close and Dr Baird Cancer   Dietary  issues and exercise activities discussed: Current Exercise Habits: The patient does not participate in regular exercise at present   Goals  Addressed             This Visit's Progress    Patient Stated       None at this time       Depression Screen    06/08/2022    1:06 PM 10/13/2021    2:03 PM 09/29/2021    4:13 PM 05/27/2021    9:09 AM 03/19/2021   11:16 AM 05/16/2020   11:21 AM 12/19/2018   11:51 AM  PHQ 2/9 Scores  PHQ - 2 Score $Remov'1 3 3 1 2 1 2  'RZaMZP$ PHQ- 9 Score  $Remo'6 6    5    'MhhHg$ Fall Risk    06/08/2022    1:08 PM 06/01/2022    9:14 AM 10/13/2021    4:17 PM 05/27/2021    9:09 AM 05/16/2020   11:21 AM  Fall Risk   Falls in the past year? 1 1 0 0 0  Number falls in past yr: 1 0 0 0 0  Injury with Fall? 0 0 0 0 0  Risk for fall due to : Impaired vision;Impaired mobility;Impaired balance/gait  No Fall Risks    Risk for fall due to: Comment left knee      Follow up Falls prevention discussed  Falls evaluation completed;Education provided;Falls prevention discussed Falls prevention discussed Education provided;Falls prevention discussed    FALL RISK PREVENTION PERTAINING TO THE HOME:  Any stairs in or around the home? Yes  If so, are there any without handrails? No  Home free of loose throw rugs in walkways, pet beds, electrical cords, etc? Yes  Adequate lighting in your home to reduce risk of falls? Yes   ASSISTIVE DEVICES UTILIZED TO PREVENT FALLS:  Life alert? Yes  Use of a cane, walker or w/c? Yes  Grab bars in the bathroom? Yes  Shower chair or bench in shower? No  Elevated toilet seat or a handicapped toilet? Yes   TIMED UP AND GO:  Was the test performed? No .   Cognitive Function: declined , Pt is Alert and oriented         Immunizations Immunization History  Administered Date(s) Administered   PFIZER Comirnaty(Gray Top)Covid-19 Tri-Sucrose Vaccine 03/19/2021   PFIZER(Purple Top)SARS-COV-2 Vaccination 01/06/2020, 02/06/2020, 09/22/2020   Pneumococcal Conjugate-13 08/27/2016   Pneumococcal Polysaccharide-23 05/08/2003, 11/10/2009, 12/15/2018   Td 05/08/2003   Tdap 06/08/2011   Zoster Recombinat  (Shingrix) 03/12/2020, 05/16/2020, 05/16/2020   Zoster, Live 02/10/2012    TDAP status: Due, Education has been provided regarding the importance of this vaccine. Advised may receive this vaccine at local pharmacy or Health Dept. Aware to provide a copy of the vaccination record if obtained from local pharmacy or Health Dept. Verbalized acceptance and understanding.  Flu Vaccine status: Declined, Education has been provided regarding the importance of this vaccine but patient still declined. Advised may receive this vaccine at local pharmacy or Health Dept. Aware to provide a copy of the vaccination record if obtained from local pharmacy or Health Dept. Verbalized acceptance and understanding.  Pneumococcal vaccine status: Up to date  Covid-19 vaccine status: Completed vaccines  Qualifies for Shingles Vaccine? Yes   Zostavax completed Yes   Shingrix Completed?: Yes  Screening Tests Health Maintenance  Topic Date Due   COVID-19 Vaccine (5 - Pfizer series) 05/14/2021   TETANUS/TDAP  06/07/2021   COLONOSCOPY (Pts 45-1yrs Insurance coverage will  need to be confirmed)  03/24/2023 (Originally 02/16/2021)   MAMMOGRAM  12/20/2023 (Originally 02/24/2012)   DEXA SCAN  12/20/2023 (Originally 02/19/2016)   Hepatitis C Screening  12/20/2023 (Originally 02/18/1969)   INFLUENZA VACCINE  08/21/2024 (Originally 06/01/2022)   HEMOGLOBIN A1C  09/18/2022   OPHTHALMOLOGY EXAM  01/06/2023   FOOT EXAM  03/18/2023   Pneumonia Vaccine 1+ Years old  Completed   Zoster Vaccines- Shingrix  Completed   HPV VACCINES  Aged Out    Health Maintenance  Health Maintenance Due  Topic Date Due   COVID-19 Vaccine (5 - Pfizer series) 05/14/2021   TETANUS/TDAP  06/07/2021    Colorectal cancer screening: No longer required.  Per pt   Mammogram status: No longer required due to per pt .      Additional Screening:  Hepatitis C Screening: does qualify;  Vision Screening: Recommended annual ophthalmology exams  for early detection of glaucoma and other disorders of the eye. Is the patient up to date with their annual eye exam?  Yes  Who is the provider or what is the name of the office in which the patient attends annual eye exams? Dr Valetta Close  If pt is not established with a provider, would they like to be referred to a provider to establish care? No .   Dental Screening: Recommended annual dental exams for proper oral hygiene  Community Resource Referral / Chronic Care Management: CRR required this visit?  No   CCM required this visit?  No      Plan:     I have personally reviewed and noted the following in the patient's chart:   Medical and social history Use of alcohol, tobacco or illicit drugs  Current medications and supplements including opioid prescriptions.  Functional ability and status Nutritional status Physical activity Advanced directives List of other physicians Hospitalizations, surgeries, and ER visits in previous 12 months Vitals Screenings to include cognitive, depression, and falls Referrals and appointments  In addition, I have reviewed and discussed with patient certain preventive protocols, quality metrics, and best practice recommendations. A written personalized care plan for preventive services as well as general preventive health recommendations were provided to patient.     Willette Brace, LPN   5/36/1443   Nurse Notes: None

## 2022-07-14 ENCOUNTER — Ambulatory Visit: Payer: Medicare Other | Admitting: Podiatry

## 2022-07-21 ENCOUNTER — Other Ambulatory Visit: Payer: Self-pay | Admitting: Family Medicine

## 2022-07-21 ENCOUNTER — Other Ambulatory Visit: Payer: Self-pay | Admitting: Internal Medicine

## 2022-07-21 DIAGNOSIS — I10 Essential (primary) hypertension: Secondary | ICD-10-CM

## 2022-07-26 ENCOUNTER — Ambulatory Visit (INDEPENDENT_AMBULATORY_CARE_PROVIDER_SITE_OTHER): Payer: Medicare Other | Admitting: Podiatry

## 2022-07-26 ENCOUNTER — Encounter: Payer: Self-pay | Admitting: Podiatry

## 2022-07-26 DIAGNOSIS — M79676 Pain in unspecified toe(s): Secondary | ICD-10-CM | POA: Diagnosis not present

## 2022-07-26 DIAGNOSIS — E1151 Type 2 diabetes mellitus with diabetic peripheral angiopathy without gangrene: Secondary | ICD-10-CM

## 2022-07-26 DIAGNOSIS — B351 Tinea unguium: Secondary | ICD-10-CM | POA: Diagnosis not present

## 2022-07-26 DIAGNOSIS — Z794 Long term (current) use of insulin: Secondary | ICD-10-CM

## 2022-07-26 NOTE — Progress Notes (Signed)
This patient returns to my office for at risk foot care.  This patient requires this care by a professional since this patient will be at risk due to having diabetes and history of thrombophlebitis.  This patient says her callus on her big toes is painful walking and wearing her shoes.  She is unable to self treat.  This patient presents for at risk foot care today.  General Appearance  Alert, conversant and in no acute stress.  Vascular  Dorsalis pedis and posterior tibial  pulses are not  palpable  Bilaterally  Due to swelling..  Capillary return is within normal limits  bilaterally. Temperature is within normal limits  bilaterally.  Neurologic  Senn-Weinstein monofilament wire test diminished  bilaterally. Muscle power within normal limits bilaterally.  Nails Thick disfigured discolored nails with subungual debris  from hallux to fifth toes bilaterally. No evidence of bacterial infection or drainage bilaterally.  Orthopedic  No limitations of motion  feet .  No crepitus or effusions noted.  No bony pathology or digital deformities noted.  Skin  normotropic skin with no porokeratosis noted bilaterally.  No signs of infections or ulcers noted.   Pinch callus  B/L.   No infection or drainage noted.  Onychomycosis  Pain in right toes  Pain in left toes    Consent was obtained for treatment procedures.   Mechanical debridement of nails 1-5  bilaterally performed with a nail nipper.  Filed with dremel without incident.  Told her to use vaseline on left nail bed.  Dispense toe cap for second toe callus..   Return office visit   9  weeks                  Told patient to return for periodic foot care and evaluation due to potential at risk complications.   Gardiner Barefoot DPM

## 2022-08-19 NOTE — Progress Notes (Signed)
Patient ID: Andrea Marks, female   DOB: [birth year], 71 y.o.   MRN: [id number]  HPI: Andrea Marks is a 71 y.o.-year-old femalemale, returning for f/u for DM2, dx 1994, insulin-dependent, uncontrolled, with complications (diabetic retinopathy). Last visit was 5 months ago.  Interim history: No increased urination, nausea, chest pain.  She has R knee pain - 2 fractures -before last visit, she had a nerve ablation.  She also had a steroid injection.  No steroid injections since last visit - now gel injections.  Reviewed HbA1c levels: Lab Results  Component Value Date   HGBA1C 5.6 03/18/2022   HGBA1C 5.6 11/16/2021   HGBA1C 5.9 (H) 02/26/2021   She is on: - Metformin 850 mg in am and  1700 mg at night - Ozempic 1 mg weekly - Basaglar 70 >> 60 >> 50 >> 36 units at bedtime - NovoLog 3x a day before meals 15-18 units before meals and 5 to 8 units before a snack >> 12-15 units before meals Tried Bydureon once a week >> she had problems injecting the medication.  She also tried Victoza. She was on Humalog in the past. She was on Amaryl in the past, which we stopped going to increase her insulin doses 05/2018. She was on a Vgo pump, which we stopped in 2019 as she needed higher doses of insulin.  She checks her sugars more than 4 times a day with her freestyle libre 2 CGM (with receiver - she shut down the alarms):   Previously:   Previously:   Lowest blood sugars: 50s >> 50s >> 54. Highest blood sugars: 300s (steroids) >> 200s >> 200s >> 300s.  Meals: - Breakfast: egg + bagel + yoghurt - Lunch: may skip, salad - Dinner: salad, hamburger/hot dog, tuna fish - Snacks: potato chips, pretzel No sodas, only drinks sparkling water.  No CKD, last BUN/creatinine was:  Lab Results  Component Value Date   BUN 25 (H) 03/23/2022   CREATININE 1.06 03/23/2022  On benazepril.  + HL; Last set of lipids: Lab Results  Component Value Date   CHOL 123 03/23/2022   HDL  55.00 03/23/2022   LDLCALC 39 03/23/2022   LDLDIRECT 64.0 04/21/2017   TRIG 145.0 03/23/2022   CHOLHDL 2 03/23/2022  On Vytorin and fenofibrate.  - Last eye exam: January 05, 2022: + DR. + macular edema OS. She had cataract surgery.  She was admitted with blurry vision, papilledema in 02/26/2021.  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 She saw a retina specialist and a neurologist.  She was on steroids. She started IO injections.  - + numbness and tingling in her feet.  She sees podiatry-Dr. Prudence Marks.  Last foot exam 03/2022.  She had an ulcer in lower right leg (has lymphedema) >> healed. She also has a history of HTN, GERD, depression, h/o thrombophlebitis. Previously on B12 injections now on 1000 mcg p.o. B12 daily.  In 03/2022, she lost consciousness after a vomiting episode.  She was down, on the floor, near her bed, the entire night, as she could not get up.  Since then, she ordered another alert bracelet and also an alert button for the shower.  She was not sure if her sugars were low at that time but the investigation was negative for culprits when paramedics came.  ROS: + see HPI  I reviewed pt's medications, allergies, PMH, social hx, family hx, and changes were documented in the history of present illness. Otherwise, unchanged from my initial visit  note.   Past Medical History:  Diagnosis Date   Depression    Diabetes mellitus    GERD (gastroesophageal reflux disease)    Hyperlipidemia    Hypertension    Obesity    Past Surgical History:  Procedure Laterality Date   ABDOMINAL HYSTERECTOMY  1991   BSO   IR ABLATE LIVER CRYOABLATION  12/31/2021   IR RADIOLOGIST EVAL & MGMT  12/11/2021   IR RADIOLOGIST EVAL & MGMT  01/07/2022   IR RADIOLOGIST EVAL & MGMT  02/15/2022   Social History   Socioeconomic History   Marital status: Widowed    Spouse name: Not on file   Number of children: 0   Years of education: 70   Highest education level: Some  college, no degree  Occupational History   Occupation: retired from [employer]: RETIRED  Tobacco Use   Smoking status: Former    Types: Cigarettes    Quit date: 1995    Years since quitting: 28.8    Passive exposure: Past   Smokeless tobacco: Never  Vaping Use   Vaping Use: Never used  Substance and Sexual Activity   Alcohol use: No   Drug use: No   Sexual activity: Not Currently    Partners: Male  Other Topics Concern   Not on file  Social History Narrative   Lives with husband   Right Handed   Drinks 3-5 cups caffeine daily   Social Determinants of Health   Financial Resource Strain: Low Risk  (06/08/2022)   Overall Financial Resource Strain (CARDIA)    Difficulty of Paying Living Expenses: Not hard at all  Food Insecurity: No Food Insecurity (06/08/2022)   Hunger Vital Sign    Worried About Running Out of Food in the Last Year: Never true    Mack in the Last Year: Never true  Transportation Needs: No Transportation Needs (06/08/2022)   PRAPARE - Hydrologist (Medical): No    Lack of Transportation (Non-Medical): No  Physical Activity: Inactive (06/08/2022)   Exercise Vital Sign    Days of Exercise per Week: 0 days    Minutes of Exercise per Session: 0 min  Stress: Stress Concern Present (06/08/2022)   Patriot    Feeling of Stress : Rather much  Social Connections: Socially Isolated (06/08/2022)   Social Connection and Isolation Panel [NHANES]    Frequency of Communication with Friends and Family: More than three times a week    Frequency of Social Gatherings with Friends and Family: Twice a week    Attends Religious Services: Never    Marine scientist or Organizations: No    Attends Archivist Meetings: Never    Marital Status: Widowed  Intimate Partner Violence: Not At Risk (06/08/2022)   Humiliation, Afraid, Rape, and Kick questionnaire     Fear of Current or Ex-Partner: No    Emotionally Abused: No    Physically Abused: No    Sexually Abused: No   Current Outpatient Medications on File Prior to Visit  Medication Sig Dispense Refill   benazepril (LOTENSIN) 20 MG tablet TAKE 1 TABLET DAILY 90 tablet 1   acetaminophen (TYLENOL) 650 MG CR tablet Take 650 mg by mouth 2 (two) times daily as needed for pain. For pain     aspirin 81 MG tablet Take 81 mg by mouth daily.     Blood Glucose Monitoring  Suppl (ONE TOUCH ULTRA SYSTEM KIT) W/DEVICE KIT 1 kit by Does not apply route once. 1 each 0   estradiol (ESTRACE) 1 MG tablet TAKE 1 TABLET DAILY 90 tablet 1   ezetimibe-simvastatin (VYTORIN) 10-40 MG tablet TAKE 1 TABLET AT BEDTIME 90 tablet 1   fenofibrate 160 MG tablet TAKE 1 TABLET DAILY 90 tablet 1   FLUoxetine (PROZAC) 20 MG tablet TAKE 3 TABLETS DAILY 270 tablet 1   glucose blood (ONETOUCH ULTRA) test strip Use to check blood sugar 3 times a day 100 each 0   insulin aspart (NOVOLOG FLEXPEN) 100 UNIT/ML FlexPen Inject 12-15 Units into the skin 3 (three) times daily with meals. 45 mL 3   Insulin Glargine (BASAGLAR KWIKPEN) 100 UNIT/ML INJECT 60 UNITS            SUBCUTANEOUSLY AT BEDTIME 45 mL 3   Insulin Pen Needle (PEN NEEDLES) 32G X 4 MM MISC USE AS INSTRUCTED WITH LANCETS. 400 each 3   meloxicam (MOBIC) 15 MG tablet Take 1 tablet (15 mg total) by mouth daily. 30 tablet 0   metFORMIN (GLUCOPHAGE) 850 MG tablet Take 1 tablet in the AM, and 2 tablets in the PM. 270 tablet 3   pantoprazole (PROTONIX) 40 MG tablet TAKE 1 TABLET DAILY 90 tablet 1   Semaglutide, 1 MG/DOSE, (OZEMPIC, 1 MG/DOSE,) 4 MG/3ML SOPN INJECT $RemoveBef'1MG'ZiIEwnLSCU$  SUBCUTANEOUSLY  ONCE A WEEK. 9 mL 3   Current Facility-Administered Medications on File Prior to Visit  Medication Dose Route Frequency Provider Last Rate Last Admin   ipratropium-albuterol (DUONEB) 0.5-2.5 (3) MG/3ML nebulizer solution 3 mL  3 mL Nebulization Q6H Copland, Jessica C, MD   3 mL at 11/15/16 1330    Allergies  Allergen Reactions   Hydrocodone-Acetaminophen Other (See Comments)    unknown   Oxycodone Hcl Other (See Comments)    unknown   Penicillins Other (See Comments)    unknown   Prednisone     Patient has noted allergy to prednisone but she says it was a questionable history of mild nausea with prednisone and nothing more    Family History  Problem Relation Age of Onset   Macular degeneration Mother    Heart disease Mother        syncope   Aortic stenosis Mother    Lung disease Father 84       mesothelioma   Cancer Maternal Aunt        stomach   Heart disease Paternal Uncle        cabg   Diabetes Paternal Uncle    Heart disease Paternal Uncle    Diabetes Paternal Uncle    Diabetes Paternal Uncle    Heart disease Paternal Uncle    Heart disease Paternal Uncle    Heart disease Paternal Uncle    Lung cancer Other        asbestos   Diabetes Paternal Grandmother    Cancer Other        lung   PE: BP 120/76 (BP Location: Right Arm, Patient Position: Sitting, Cuff Size: Normal)   Pulse 85   Ht $R'5\' 3"'pR$  (1.6 m)   Wt 263 lb 9.6 oz (119.6 kg)   SpO2 95%   BMI 46.69 kg/m  Wt Readings from Last 3 Encounters:  08/20/22 263 lb 9.6 oz (119.6 kg)  03/23/22 259 lb 6.4 oz (117.7 kg)  03/18/22 259 lb 3.2 oz (117.6 kg)   Constitutional: overweight, in NAD Eyes: EOMI, no exophthalmos ENT: no thyromegaly, no cervical  lymphadenopathy Cardiovascular: RRR, No MRG, + B LE edema Respiratory: CTA B Musculoskeletal: no deformities Skin: no rashes except stasis dermatitis of bilateral lower legs Neurological: no tremor with outstretched hands.  ASSESSMENT: 1. DM2, insulin-dependent, uncontrolled, with complications - DR  2. Obesity class 3  3. HL  PLAN:  1. Patient with history of uncontrolled type 2 diabetes, insulin-dependent, with worse control in 2020, when she returned after an absence of 1.5 years.  At that time, she had a lot of stress and relaxed her diet and  sugars were in the 200s-400s.  We discussed about improving diet and we also increased her insulin and GLP-1 receptor agonist.  Sugars started to improve afterwards and at last visit, HbA1c was excellent, at 5.6%.  We decreased her insulin doses then. CGM interpretation: -At today's visit, we reviewed her CGM downloads: It appears that 86% of values are in target range (goal >70%), while 13% are higher than 180 (goal <25%), and 1% are lower than 70 (goal <4%).  The calculated average blood sugar is 132.  The projected HbA1c for the next 3 months (GMI) is 6.5%. -Reviewing the CGM trends, it appears that her sugars are slowly increasing overnight and they are then higher occasionally after lunch and dinner.  This hyperglycemic postprandial values are followed by decreasing blood sugars, occasionally at 70 mg/dL lower.  Upon questioning, she sometimes forgets to take the NovoLog before the meal and takes it after the meal.  We discussed that in the situations, to reduce the dose by approximately 40%.  I also advised her that for larger meals, she may need higher doses of NovoLog.  I am tempted to increase her basal insulin since her sugars are increasing overnight, however, for now, we decided to increase her Ozempic dose and to try to reduce the doses of insulin further.  I advised her for now to continue the same insulin doses but have a low threshold to decrease them if her sugars improved. - I advised her to:  Patient Instructions  Please continue: - Metformin 850 mg in am and 1700 mg at night - Basaglar 36 units at bedtime - NovoLog 12-15 (18) 3x a day before meals  Please: - Ozempic 2 mg weekly  Please return in 4 months.  - we checked her HbA1c: 5.8% (stable) - advised to check sugars at different times of the day - 4x a day, rotating check times - advised for yearly eye exams >> she is UTD - return to clinic in 4 months  2. Obesity class 3  -We will continue Ozempic which should also help  with weight loss -We are also decreasing her insulin dose, which should help with weight loss -Lost 16 pounds before the last 2 visits combined -She gained 4 pounds since last visit  3. HL -Latest lipid panel from 03/2022: All fractions at goal: Lab Results  Component Value Date   CHOL 123 03/23/2022   HDL 55.00 03/23/2022   LDLCALC 39 03/23/2022   LDLDIRECT 64.0 04/21/2017   TRIG 145.0 03/23/2022   CHOLHDL 2 03/23/2022  -She continues on simvastatin-ezetimibe 40-10 mg daily and fenofibrate 160 mg daily without side effects  Philemon Kingdom, MD PhD [facility] Endocrinology

## 2022-08-20 ENCOUNTER — Encounter: Payer: Self-pay | Admitting: Internal Medicine

## 2022-08-20 ENCOUNTER — Ambulatory Visit (INDEPENDENT_AMBULATORY_CARE_PROVIDER_SITE_OTHER): Payer: Medicare Other | Admitting: Internal Medicine

## 2022-08-20 VITALS — BP 120/76 | HR 85 | Ht 63.0 in | Wt 263.6 lb

## 2022-08-20 DIAGNOSIS — E785 Hyperlipidemia, unspecified: Secondary | ICD-10-CM

## 2022-08-20 DIAGNOSIS — E1165 Type 2 diabetes mellitus with hyperglycemia: Secondary | ICD-10-CM | POA: Diagnosis not present

## 2022-08-20 DIAGNOSIS — Z794 Long term (current) use of insulin: Secondary | ICD-10-CM | POA: Diagnosis not present

## 2022-08-20 DIAGNOSIS — E11319 Type 2 diabetes mellitus with unspecified diabetic retinopathy without macular edema: Secondary | ICD-10-CM

## 2022-08-20 LAB — POCT GLYCOSYLATED HEMOGLOBIN (HGB A1C): Hemoglobin A1C: 5.8 % — AB (ref 4.0–5.6)

## 2022-08-20 MED ORDER — BASAGLAR KWIKPEN 100 UNIT/ML ~~LOC~~ SOPN: PEN_INJECTOR | SUBCUTANEOUS | 3 refills | Status: DC

## 2022-08-20 MED ORDER — SEMAGLUTIDE (2 MG/DOSE) 8 MG/3ML ~~LOC~~ SOPN: 2.0000 mg | PEN_INJECTOR | SUBCUTANEOUS | 3 refills | Status: DC

## 2022-08-20 MED ORDER — METFORMIN HCL 850 MG PO TABS: ORAL_TABLET | ORAL | 3 refills | Status: DC

## 2022-08-20 NOTE — Patient Instructions (Addendum)
Please continue: - Metformin 850 mg in am and 1700 mg at night - Basaglar 36 units at bedtime - NovoLog 12-15 (18) 3x a day before meals  Please: - Ozempic 2 mg weekly  Please return in 4 months.

## 2022-09-01 ENCOUNTER — Encounter: Payer: Self-pay | Admitting: Family Medicine

## 2022-09-01 DIAGNOSIS — K219 Gastro-esophageal reflux disease without esophagitis: Secondary | ICD-10-CM

## 2022-09-01 MED ORDER — PANTOPRAZOLE SODIUM 40 MG PO TBEC: 40.0000 mg | DELAYED_RELEASE_TABLET | Freq: Every day | ORAL | 1 refills | Status: DC

## 2022-09-03 MED ORDER — PANTOPRAZOLE SODIUM 40 MG PO TBEC: 40.0000 mg | DELAYED_RELEASE_TABLET | Freq: Every day | ORAL | 1 refills | Status: DC

## 2022-09-03 NOTE — Addendum Note (Signed)
Addended byDamita Dunnings D on: 09/03/2022 02:34 PM   Modules accepted: Orders

## 2022-09-06 ENCOUNTER — Encounter: Payer: Self-pay | Admitting: Family Medicine

## 2022-09-13 ENCOUNTER — Encounter: Payer: Self-pay | Admitting: Family Medicine

## 2022-09-13 DIAGNOSIS — K219 Gastro-esophageal reflux disease without esophagitis: Secondary | ICD-10-CM

## 2022-09-13 MED ORDER — PANTOPRAZOLE SODIUM 40 MG PO TBEC: 40.0000 mg | DELAYED_RELEASE_TABLET | Freq: Every day | ORAL | 1 refills | Status: DC

## 2022-09-27 ENCOUNTER — Ambulatory Visit: Payer: Medicare Other | Admitting: Podiatry

## 2022-09-28 ENCOUNTER — Encounter: Payer: Medicare Other | Admitting: Family Medicine

## 2022-10-04 ENCOUNTER — Ambulatory Visit (INDEPENDENT_AMBULATORY_CARE_PROVIDER_SITE_OTHER): Payer: Medicare Other | Admitting: Podiatry

## 2022-10-04 ENCOUNTER — Encounter: Payer: Self-pay | Admitting: Podiatry

## 2022-10-04 ENCOUNTER — Other Ambulatory Visit: Payer: Self-pay | Admitting: Family Medicine

## 2022-10-04 VITALS — BP 170/77 | HR 75

## 2022-10-04 DIAGNOSIS — Z794 Long term (current) use of insulin: Secondary | ICD-10-CM

## 2022-10-04 DIAGNOSIS — E1151 Type 2 diabetes mellitus with diabetic peripheral angiopathy without gangrene: Secondary | ICD-10-CM | POA: Diagnosis not present

## 2022-10-04 DIAGNOSIS — B351 Tinea unguium: Secondary | ICD-10-CM | POA: Diagnosis not present

## 2022-10-04 DIAGNOSIS — M79676 Pain in unspecified toe(s): Secondary | ICD-10-CM | POA: Diagnosis not present

## 2022-10-04 DIAGNOSIS — F418 Other specified anxiety disorders: Secondary | ICD-10-CM

## 2022-10-04 DIAGNOSIS — Z780 Asymptomatic menopausal state: Secondary | ICD-10-CM

## 2022-10-04 NOTE — Progress Notes (Signed)
This patient returns to my office for at risk foot care.  This patient requires this care by a professional since this patient will be at risk due to having diabetes and history of thrombophlebitis.  This patient says her callus on her big toes is painful walking and wearing her shoes.  She is unable to self treat.  This patient presents for at risk foot care today.  General Appearance  Alert, conversant and in no acute stress.  Vascular  Dorsalis pedis and posterior tibial  pulses are not  palpable  Bilaterally  Due to swelling..  Capillary return is within normal limits  bilaterally. Temperature is within normal limits  bilaterally.  Neurologic  Senn-Weinstein monofilament wire test diminished  bilaterally. Muscle power within normal limits bilaterally.  Nails Thick disfigured discolored nails with subungual debris  from hallux to fifth toes bilaterally. No evidence of bacterial infection or drainage bilaterally.  Orthopedic  No limitations of motion  feet .  No crepitus or effusions noted.  No bony pathology or digital deformities noted.  Skin  normotropic skin with no porokeratosis noted bilaterally.  No signs of infections or ulcers noted.   Pinch callus  B/L.   No infection or drainage noted.  Onychomycosis  Pain in right toes  Pain in left toes    Consent was obtained for treatment procedures.   Mechanical debridement of nails 1-5  bilaterally performed with a nail nipper.  Filed with dremel without incident.     Return office visit   9  weeks                  Told patient to return for periodic foot care and evaluation due to potential at risk complications.   Lamija Besse DPM  

## 2022-10-12 ENCOUNTER — Encounter: Payer: Self-pay | Admitting: Internal Medicine

## 2022-10-14 ENCOUNTER — Other Ambulatory Visit (HOSPITAL_COMMUNITY): Payer: Self-pay

## 2022-10-18 ENCOUNTER — Encounter: Payer: Self-pay | Admitting: Family Medicine

## 2022-10-18 ENCOUNTER — Other Ambulatory Visit (HOSPITAL_BASED_OUTPATIENT_CLINIC_OR_DEPARTMENT_OTHER): Payer: Self-pay

## 2022-10-18 ENCOUNTER — Ambulatory Visit (INDEPENDENT_AMBULATORY_CARE_PROVIDER_SITE_OTHER): Payer: Medicare Other | Admitting: Family Medicine

## 2022-10-18 VITALS — BP 142/90 | HR 88 | Temp 98.5°F | Resp 20 | Ht 63.0 in | Wt 257.6 lb

## 2022-10-18 DIAGNOSIS — K219 Gastro-esophageal reflux disease without esophagitis: Secondary | ICD-10-CM | POA: Diagnosis not present

## 2022-10-18 DIAGNOSIS — E11649 Type 2 diabetes mellitus with hypoglycemia without coma: Secondary | ICD-10-CM

## 2022-10-18 DIAGNOSIS — I10 Essential (primary) hypertension: Secondary | ICD-10-CM | POA: Diagnosis not present

## 2022-10-18 DIAGNOSIS — E782 Mixed hyperlipidemia: Secondary | ICD-10-CM | POA: Diagnosis not present

## 2022-10-18 DIAGNOSIS — Z794 Long term (current) use of insulin: Secondary | ICD-10-CM

## 2022-10-18 DIAGNOSIS — E1169 Type 2 diabetes mellitus with other specified complication: Secondary | ICD-10-CM

## 2022-10-18 DIAGNOSIS — E785 Hyperlipidemia, unspecified: Secondary | ICD-10-CM

## 2022-10-18 DIAGNOSIS — E1165 Type 2 diabetes mellitus with hyperglycemia: Secondary | ICD-10-CM

## 2022-10-18 DIAGNOSIS — M170 Bilateral primary osteoarthritis of knee: Secondary | ICD-10-CM

## 2022-10-18 DIAGNOSIS — F321 Major depressive disorder, single episode, moderate: Secondary | ICD-10-CM

## 2022-10-18 LAB — LIPID PANEL
Cholesterol: 122 mg/dL (ref 0–200)
HDL: 60.5 mg/dL (ref >39.00)
LDL Cholesterol: 36 mg/dL (ref 0–99)
NonHDL: 61.48
Total CHOL/HDL Ratio: 2
Triglycerides: 127 mg/dL (ref 0.0–149.0)
VLDL: 25.4 mg/dL (ref 0.0–40.0)

## 2022-10-18 LAB — CBC WITH DIFFERENTIAL/PLATELET
Basophils Absolute: 0.1 10*3/uL (ref 0.0–0.1)
Basophils Relative: 0.9 % (ref 0.0–3.0)
Eosinophils Absolute: 0.1 10*3/uL (ref 0.0–0.7)
Eosinophils Relative: 1 % (ref 0.0–5.0)
HCT: 44.7 % (ref 36.0–46.0)
Hemoglobin: 14.6 g/dL (ref 12.0–15.0)
Lymphocytes Relative: 21.3 % (ref 12.0–46.0)
Lymphs Abs: 2.2 10*3/uL (ref 0.7–4.0)
MCHC: 32.6 g/dL (ref 30.0–36.0)
MCV: 82.8 fl (ref 78.0–100.0)
Monocytes Absolute: 0.6 10*3/uL (ref 0.1–1.0)
Monocytes Relative: 5.3 % (ref 3.0–12.0)
Neutro Abs: 7.4 10*3/uL (ref 1.4–7.7)
Neutrophils Relative %: 71.5 % (ref 43.0–77.0)
Platelets: 215 10*3/uL (ref 150.0–400.0)
RBC: 5.4 Mil/uL — ABNORMAL HIGH (ref 3.87–5.11)
RDW: 16.3 % — ABNORMAL HIGH (ref 11.5–15.5)
WBC: 10.4 10*3/uL (ref 4.0–10.5)

## 2022-10-18 LAB — COMPREHENSIVE METABOLIC PANEL
ALT: 9 U/L (ref 0–35)
AST: 12 U/L (ref 0–37)
Albumin: 4 g/dL (ref 3.5–5.2)
Alkaline Phosphatase: 26 U/L — ABNORMAL LOW (ref 39–117)
BUN: 21 mg/dL (ref 6–23)
CO2: 28 mEq/L (ref 19–32)
Calcium: 9.7 mg/dL (ref 8.4–10.5)
Chloride: 102 mEq/L (ref 96–112)
Creatinine, Ser: 0.94 mg/dL (ref 0.40–1.20)
GFR: 60.98 mL/min (ref >60.00)
Glucose, Bld: 176 mg/dL — ABNORMAL HIGH (ref 70–99)
Potassium: 4.5 mEq/L (ref 3.5–5.1)
Sodium: 140 mEq/L (ref 135–145)
Total Bilirubin: 0.4 mg/dL (ref 0.2–1.2)
Total Protein: 6.1 g/dL (ref 6.0–8.3)

## 2022-10-18 LAB — MICROALBUMIN / CREATININE URINE RATIO
Creatinine,U: 200.5 mg/dL
Microalb Creat Ratio: 24.2 mg/g (ref 0.0–30.0)
Microalb, Ur: 48.6 mg/dL — ABNORMAL HIGH (ref 0.0–1.9)

## 2022-10-18 LAB — HEMOGLOBIN A1C: Hgb A1c MFr Bld: 6.2 % (ref 4.6–6.5)

## 2022-10-18 MED ORDER — COMIRNATY 30 MCG/0.3ML IM SUSY
PREFILLED_SYRINGE | INTRAMUSCULAR | 0 refills | Status: DC
  Filled 2022-10-18: qty 0.3, 1d supply, fill #0

## 2022-10-18 MED ORDER — PANTOPRAZOLE SODIUM 40 MG PO TBEC: 40.0000 mg | DELAYED_RELEASE_TABLET | Freq: Every day | ORAL | 3 refills | Status: DC

## 2022-10-18 NOTE — Assessment & Plan Note (Signed)
Per endocrinology 

## 2022-10-18 NOTE — Assessment & Plan Note (Signed)
Pt is getting injections She does not want surgery

## 2022-10-18 NOTE — Assessment & Plan Note (Signed)
Well controlled, no changes to meds. Encouraged heart healthy diet such as the DASH diet and exercise as tolerated.  °

## 2022-10-18 NOTE — Assessment & Plan Note (Signed)
Tolerating statin, encouraged heart healthy diet, avoid trans fats, minimize simple carbs and saturated fats. Increase exercise as tolerated 

## 2022-10-18 NOTE — Patient Instructions (Signed)

## 2022-10-18 NOTE — Assessment & Plan Note (Signed)
Still dealing with grief Has decided to spend Christmas home alone-- she has had multiple invitations but has a neighbor who is recovering from surgery and will stay home in case she needs help  She had family over for Thanksgiving

## 2022-10-18 NOTE — Progress Notes (Addendum)
Subjective:   By signing my name below, I, Andrea Marks, attest that this documentation has been prepared under the direction and in the presence of Andrea Marks 10/18/2022    Patient ID: Andrea Marks, female    DOB: 01-10-1951, 71 y.o.   MRN: [id number]  Chief Complaint  Patient presents with   Diabetes   Hypertension   Hyperlipidemia   Follow-up    HPI Patient is in today for an office visit  As of today's visit, her blood pressure is elevated. She states that during this time of the year, her blood pressure is usually elevated. She does not regularly check her blood pressure at home.  BP Readings from Last 3 Encounters:  10/18/22 (!) 142/90  10/04/22 (!) 170/77  08/20/22 120/76   Pulse Readings from Last 3 Encounters:  10/18/22 88  10/04/22 75  08/20/22 85   She is having troubles with receiving her 40 mg of Pantoprazole due to her insurance. She has recently purchased Prilosec for her symptoms.   Due to the weather getting cooler, she has trouble reaching her mailbox. She was informed that her [employer], if requested, can go to the front door to drop off mail instead of the mailbox. She requested for her orthopedic specialist to make the request to the [facility]. She was informed that her orthopedic could not Marks so and is therefore, asking for her PCP to make the request. She notes that she is not interested in undergoing a knee replacement surgery due to stories that she has heard. She states that her gel injections are improving her symptoms, she rates pain about a 6/10. However, prior to injections, she rates pain about a 9/10.  Past Medical History:  Diagnosis Date   Depression    Diabetes mellitus    GERD (gastroesophageal reflux disease)    Hyperlipidemia    Hypertension    Obesity     Past Surgical History:  Procedure Laterality Date   ABDOMINAL HYSTERECTOMY  1991   BSO   IR ABLATE LIVER CRYOABLATION  12/31/2021   IR RADIOLOGIST EVAL & MGMT   12/11/2021   IR RADIOLOGIST EVAL & MGMT  01/07/2022   IR RADIOLOGIST EVAL & MGMT  02/15/2022    Family History  Problem Relation Age of Onset   Macular degeneration Mother    Heart disease Mother        syncope   Aortic stenosis Mother    Lung disease Father 83       mesothelioma   Cancer Maternal Aunt        stomach   Heart disease Paternal Uncle        cabg   Diabetes Paternal Uncle    Heart disease Paternal Uncle    Diabetes Paternal Uncle    Diabetes Paternal Uncle    Heart disease Paternal Uncle    Heart disease Paternal Uncle    Heart disease Paternal Uncle    Lung cancer Other        asbestos   Diabetes Paternal Grandmother    Cancer Other        lung    Social History   Socioeconomic History   Marital status: Widowed    Spouse name: Not on file   Number of children: 0   Years of education: 55   Highest education level: Some college, no degree  Occupational History   Occupation: retired from [employer], [employer]: [employer] Use  Smoking status: Former    Types: Cigarettes    Quit date: 1995    Years since quitting: 28.9    Passive exposure: Past   Smokeless tobacco: Never  Vaping Use   Vaping Use: Never used  Substance and Sexual Activity   Alcohol use: No   Drug use: No   Sexual activity: Not Currently    Partners: Male  Other Topics Concern   Not on file  Social History Narrative   Lives with husband   Right Handed   Drinks 3-5 cups caffeine daily   Social Determinants of Health   Financial Resource Strain: Low Risk  (06/08/2022)   Overall Financial Resource Strain (CARDIA)    Difficulty of Paying Living Expenses: Not hard at all  Food Insecurity: No Food Insecurity (06/08/2022)   Hunger Vital Sign    Worried About Running Out of Food in the Last Year: Never true    Ran Out of Food in the Last Year: Never true  Transportation Needs: No Transportation Needs (06/08/2022)   PRAPARE - Hydrologist (Medical): No     Lack of Transportation (Non-Medical): No  Physical Activity: Inactive (06/08/2022)   Exercise Vital Sign    Days of Exercise per Week: 0 days    Minutes of Exercise per Session: 0 min  Stress: Stress Concern Present (06/08/2022)   [place]    Feeling of Stress : Rather much  Social Connections: Socially Isolated (06/08/2022)   Social Connection and Isolation Panel [NHANES]    Frequency of Communication with Friends and Family: More than three times a week    Frequency of Social Gatherings with Friends and Family: Twice a week    Attends Religious Services: Never    Marine scientist or Organizations: No    Attends Archivist Meetings: Never    Marital Status: Widowed  Intimate Partner Violence: Not At Risk (06/08/2022)   Humiliation, Afraid, Rape, and Kick questionnaire    Fear of Current or Ex-Partner: No    Emotionally Abused: No    Physically Abused: No    Sexually Abused: No    Outpatient Medications Prior to Visit  Medication Sig Dispense Refill   acetaminophen (TYLENOL) 650 MG CR tablet Take 650 mg by mouth 2 (two) times daily as needed for pain. For pain     aspirin 81 MG tablet Take 81 mg by mouth daily.     benazepril (LOTENSIN) 20 MG tablet TAKE 1 TABLET DAILY 90 tablet 1   Blood Glucose Monitoring Suppl (ONE TOUCH ULTRA SYSTEM KIT) W/DEVICE KIT 1 kit by Does not apply route once. 1 each 0   estradiol (ESTRACE) 1 MG tablet TAKE 1 TABLET DAILY 90 tablet 1   ezetimibe-simvastatin (VYTORIN) 10-40 MG tablet TAKE 1 TABLET AT BEDTIME 90 tablet 1   fenofibrate 160 MG tablet TAKE 1 TABLET DAILY 90 tablet 1   FLUoxetine (PROZAC) 20 MG tablet TAKE 3 TABLETS DAILY 270 tablet 1   glucose blood (ONETOUCH ULTRA) test strip Use to check blood sugar 3 times a day 100 each 0   insulin aspart (NOVOLOG FLEXPEN) 100 UNIT/ML FlexPen Inject 12-15 Units into the skin 3 (three) times daily with meals. 45 mL 3    Insulin Glargine (BASAGLAR KWIKPEN) 100 UNIT/ML INJECT 36 UNITS SUBCUTANEOUSLY AT BEDTIME 45 mL 3   Insulin Pen Needle (PEN NEEDLES) 32G X 4 MM MISC USE AS INSTRUCTED WITH LANCETS. [place]  each 3   meloxicam (MOBIC) 15 MG tablet Take 1 tablet (15 mg total) by mouth daily. 30 tablet 0   metFORMIN (GLUCOPHAGE) 850 MG tablet Take 1 tablet in the AM, and 2 tablets in the PM. 270 tablet 3   Semaglutide, 2 MG/DOSE, 8 MG/3ML SOPN Inject 2 mg as directed once a week. 9 mL 3   pantoprazole (PROTONIX) 40 MG tablet Take 1 tablet (40 mg total) by mouth daily. 90 tablet 1   Facility-Administered Medications Prior to Visit  Medication Dose Route Frequency Provider Last Rate Last Admin   ipratropium-albuterol (DUONEB) 0.5-2.5 (3) MG/3ML nebulizer solution 3 mL  3 mL Nebulization Q6H Copland, Jessica C, MD   3 mL at 11/15/16 1330    Allergies  Allergen Reactions   Hydrocodone-Acetaminophen Other (See Comments)    unknown   Oxycodone Hcl Other (See Comments)    unknown   Penicillins Other (See Comments)    unknown   Prednisone     Patient has noted allergy to prednisone but she says it was a questionable history of mild nausea with prednisone and nothing more     Review of Systems  Constitutional:  Negative for fever and malaise/fatigue.  HENT:  Negative for congestion.   Eyes:  Negative for blurred vision.  Respiratory:  Negative for cough and shortness of breath.   Cardiovascular:  Negative for chest pain, palpitations and leg swelling.  Gastrointestinal:  Negative for vomiting.  Musculoskeletal:  Negative for back pain.  Skin:  Negative for rash.  Neurological:  Negative for loss of consciousness and headaches.       Objective:    Physical Exam Vitals and nursing note reviewed.  Constitutional:      General: She is not in acute distress.    Appearance: Normal appearance. She is not ill-appearing.  HENT:     Head: Normocephalic and atraumatic.     Right Ear: External ear normal.     Left  Ear: External ear normal.  Eyes:     Extraocular Movements: Extraocular movements intact.     Pupils: Pupils are equal, round, and reactive to light.  Cardiovascular:     Rate and Rhythm: Normal rate and regular rhythm.     Heart sounds: Normal heart sounds. No murmur heard.    No gallop.  Pulmonary:     Effort: Pulmonary effort is normal. No respiratory distress.     Breath sounds: Normal breath sounds. No wheezing or rales.  Skin:    General: Skin is warm and dry.  Neurological:     Mental Status: She is alert and oriented to person, place, and time.  Psychiatric:        Judgment: Judgment normal.     BP (!) 142/90 (BP Location: Left Arm, Patient Position: Sitting, Cuff Size: Normal)   Pulse 88   Temp 98.5 F (36.9 C) (Oral)   Resp 20   Ht _0  (1.6 m)   Wt 257 lb 9.6 oz (116.8 kg)   SpO2 95%   BMI 45.63 kg/m  Wt Readings from Last 3 Encounters:  10/18/22 257 lb 9.6 oz (116.8 kg)  08/20/22 263 lb 9.6 oz (119.6 kg)  03/23/22 259 lb 6.4 oz (117.7 kg)    Diabetic Foot Exam - Simple   No data filed    Lab Results  Component Value Date   WBC 6.8 03/23/2022   HGB 13.4 03/23/2022   HCT 41.3 03/23/2022   PLT 224.0 03/23/2022   GLUCOSE 119 (  H) 03/23/2022   CHOL 123 03/23/2022   TRIG 145.0 03/23/2022   HDL 55.00 03/23/2022   LDLDIRECT 64.0 04/21/2017   LDLCALC 39 03/23/2022   ALT 8 03/23/2022   AST 13 03/23/2022   NA 142 03/23/2022   K 4.9 03/23/2022   CL 105 03/23/2022   CREATININE 1.06 03/23/2022   BUN 25 (H) 03/23/2022   CO2 29 03/23/2022   TSH 3.22 08/07/2015   INR 1.2 RATIO 04/19/2007   HGBA1C 5.8 (A) 08/20/2022   MICROALBUR 28.9 (H) 09/10/2019    Lab Results  Component Value Date   TSH 3.22 08/07/2015   Lab Results  Component Value Date   WBC 6.8 03/23/2022   HGB 13.4 03/23/2022   HCT 41.3 03/23/2022   MCV 84.7 03/23/2022   PLT 224.0 03/23/2022   Lab Results  Component Value Date   NA 142 03/23/2022   K 4.9 03/23/2022   CO2 29  03/23/2022   GLUCOSE 119 (H) 03/23/2022   BUN 25 (H) 03/23/2022   CREATININE 1.06 03/23/2022   BILITOT 0.6 03/23/2022   ALKPHOS 27 (L) 03/23/2022   AST 13 03/23/2022   ALT 8 03/23/2022   PROT 5.9 (L) 03/23/2022   ALBUMIN 3.9 03/23/2022   CALCIUM 9.7 03/23/2022   ANIONGAP 8 03/04/2021   GFR 53.01 (L) 03/23/2022   Lab Results  Component Value Date   CHOL 123 03/23/2022   Lab Results  Component Value Date   HDL 55.00 03/23/2022   Lab Results  Component Value Date   LDLCALC 39 03/23/2022   Lab Results  Component Value Date   TRIG 145.0 03/23/2022   Lab Results  Component Value Date   CHOLHDL 2 03/23/2022   Lab Results  Component Value Date   HGBA1C 5.8 (A) 08/20/2022       Assessment & Plan:   Problem List Items Addressed This Visit       Unprioritized   Type 2 diabetes mellitus with hyperglycemia, with long-term current use of insulin ([place]) (Chronic)    Per endocrinology      Hyperlipidemia associated with type 2 diabetes mellitus ([place])    Tolerating statin, encouraged heart healthy diet, avoid trans fats, minimize simple carbs and saturated fats. Increase exercise as tolerated       GERD   Relevant Medications   pantoprazole (PROTONIX) 40 MG tablet   Essential hypertension (Chronic)    Well controlled, no changes to meds. Encouraged heart healthy diet such as the DASH diet and exercise as tolerated.        Relevant Orders   Lipid panel   CBC with Differential/Platelet   Comprehensive metabolic panel   Hemoglobin A1c   Microalbumin / creatinine urine ratio   Depression, major, single episode, moderate (HCC)    Still dealing with grief Has decided to spend Christmas home alone-- she has had multiple invitations but has a neighbor who is recovering from surgery and will stay home in case she needs help  She had family over for Thanksgiving        Degenerative arthritis of knee, bilateral    Pt is getting injections She does not want surgery        Other Visit Diagnoses     Type 2 diabetes mellitus with hypoglycemia without coma, with long-term current use of insulin ([place])    -  Primary   Relevant Orders   Lipid panel   CBC with Differential/Platelet   Comprehensive metabolic panel   Hemoglobin A1c  Microalbumin / creatinine urine ratio   Mixed hyperlipidemia       Relevant Orders   Lipid panel   CBC with Differential/Platelet   Comprehensive metabolic panel   Hemoglobin A1c   Microalbumin / creatinine urine ratio      Meds ordered this encounter  Medications   pantoprazole (PROTONIX) 40 MG tablet    Sig: Take 1 tablet (40 mg total) by mouth daily.    Dispense:  90 tablet    Refill:  3    I, Andrea Held, Marks, personally preformed the services described in this documentation.  All medical record entries made by the scribe were at my direction and in my presence.  I have reviewed the chart and discharge instructions (if applicable) and agree that the record reflects my personal performance and is accurate and complete. 10/18/2022   I,Amber Collins,acting as a scribe for Andrea Held, Marks.,have documented all relevant documentation on the behalf of Andrea Held, Marks,as directed by  Andrea Held, Marks while in the presence of Andrea Held, Marks.    Andrea Held, Marks

## 2022-10-19 ENCOUNTER — Encounter: Payer: Self-pay | Admitting: Family Medicine

## 2022-10-19 ENCOUNTER — Telehealth: Payer: Self-pay

## 2022-10-19 NOTE — Telephone Encounter (Signed)
PA initiated via Covermymeds; KEY: [id number]. Awaiting determination.

## 2022-10-19 NOTE — Telephone Encounter (Signed)
PA approved. Effective 10/19/22 to 10/19/2023.

## 2022-10-22 ENCOUNTER — Other Ambulatory Visit (HOSPITAL_COMMUNITY): Payer: Self-pay

## 2022-10-28 ENCOUNTER — Other Ambulatory Visit: Payer: Self-pay

## 2022-10-28 DIAGNOSIS — R809 Proteinuria, unspecified: Secondary | ICD-10-CM

## 2022-10-29 ENCOUNTER — Other Ambulatory Visit (HOSPITAL_COMMUNITY): Payer: Self-pay

## 2022-10-29 ENCOUNTER — Encounter: Payer: Self-pay | Admitting: Family Medicine

## 2022-11-02 ENCOUNTER — Other Ambulatory Visit (HOSPITAL_COMMUNITY): Payer: Self-pay

## 2022-11-02 ENCOUNTER — Telehealth: Payer: Self-pay | Admitting: Pharmacy Technician

## 2022-11-02 NOTE — Telephone Encounter (Signed)
Pharmacy Patient Advocate Encounter   Received notification from [facility] that prior authorization for Ozempic is required/requested. Pt received a letter dated 10/03/22 that her pa would expire in abt 60 days.  Per Test Claim: too soon to fill- next fill date 11/16/22   PA started on 11/02/22 to (ins) [employer] via CoverMyMeds Key [id number]  Waiting on clinical questions to populate.

## 2022-11-02 NOTE — Telephone Encounter (Signed)
Pharmacy Patient Advocate Encounter  Received notification from [employer]M that prior authorization for Ozempic has been resolved. No additional PA is required.

## 2022-11-04 ENCOUNTER — Other Ambulatory Visit (HOSPITAL_BASED_OUTPATIENT_CLINIC_OR_DEPARTMENT_OTHER): Payer: Self-pay

## 2022-11-04 MED ORDER — AREXVY 120 MCG/0.5ML IM SUSR
INTRAMUSCULAR | 0 refills | Status: DC
  Filled 2022-11-04: qty 1, 1d supply, fill #0

## 2022-11-22 ENCOUNTER — Encounter: Payer: Self-pay | Admitting: Internal Medicine

## 2022-11-22 LAB — HM DIABETES EYE EXAM

## 2022-11-24 ENCOUNTER — Other Ambulatory Visit: Payer: Self-pay | Admitting: Family Medicine

## 2022-11-24 DIAGNOSIS — E785 Hyperlipidemia, unspecified: Secondary | ICD-10-CM

## 2022-11-25 ENCOUNTER — Other Ambulatory Visit (HOSPITAL_COMMUNITY): Payer: Self-pay

## 2022-12-22 ENCOUNTER — Ambulatory Visit (INDEPENDENT_AMBULATORY_CARE_PROVIDER_SITE_OTHER): Payer: Medicare Other | Admitting: Internal Medicine

## 2022-12-22 ENCOUNTER — Encounter: Payer: Self-pay | Admitting: Internal Medicine

## 2022-12-22 VITALS — BP 128/82 | HR 129 | Ht 63.0 in | Wt 258.0 lb

## 2022-12-22 DIAGNOSIS — E11319 Type 2 diabetes mellitus with unspecified diabetic retinopathy without macular edema: Secondary | ICD-10-CM

## 2022-12-22 DIAGNOSIS — Z794 Long term (current) use of insulin: Secondary | ICD-10-CM | POA: Diagnosis not present

## 2022-12-22 DIAGNOSIS — E785 Hyperlipidemia, unspecified: Secondary | ICD-10-CM

## 2022-12-22 LAB — POCT GLYCOSYLATED HEMOGLOBIN (HGB A1C): Hemoglobin A1C: 5.9 % — AB (ref 4.0–5.6)

## 2022-12-22 NOTE — Patient Instructions (Addendum)
Please continue: - Metformin 850 mg in am and 1700 mg at night - Ozempic 2 mg weekly  Please decrease: - Basaglar 28 units at bedtime - NovoLog 10-15 before b'fast and lunch, but 8-10 units before dinner  Please return in 4 months.

## 2022-12-22 NOTE — Progress Notes (Signed)
Patient ID: Leydi Kocian, female   DOB: [birth year], 72 y.o.   MRN: [id number]  HPI: Doretha Vandeputte is a 72 y.o.-year-old female, returning for f/u for DM2, dx 1994, insulin-dependent, uncontrolled, with complications (diabetic retinopathy). Last visit was 4 months ago.  Interim history: No increased urination, nausea, chest pain.  She has R knee pain - 2 fractures -before last visit, she had a nerve ablation.  She also had a steroid injection.  No steroid injections since last visit - now gel injections. However, she will restart steroid inj's next mo. as she has a lot of knee pain.  Reviewed HbA1c levels: Lab Results  Component Value Date   HGBA1C 6.2 10/18/2022   HGBA1C 5.8 (A) 08/20/2022   HGBA1C 5.6 03/18/2022   She is on: - Metformin 850 mg in am and  1700 mg at night - Ozempic 1 >> 2 mg weekly - Basaglar 70 >> 60 >> 50 >> 36 units at bedtime - NovoLog 3x a day before meals 15-18 units before meals and 5 to 8 units before a snack >> 12-15 (18) units before meals Tried Bydureon once a week >> she had problems injecting the medication.  She also tried Victoza. She was on Humalog in the past. She was on Amaryl in the past, which we stopped going to increase her insulin doses 05/2018. She was on a Vgo pump, which we stopped in 2019 as she needed higher doses of insulin.  She checks her sugars more than 4 times a day with her freestyle libre 2 CGM (with receiver - she shut down the alarms):  Previously:   Previously:   Lowest blood sugars: 50s >> 54 >> 58. Highest blood sugars: 200s >> 300s >> 200s.  Meals: - Breakfast: egg + bagel + yoghurt - Lunch: may skip, salad - Dinner: salad, hamburger/hot dog, tuna fish - Snacks: potato chips, pretzel No sodas, only drinks sparkling water.  No CKD, last BUN/creatinine was:  Lab Results  Component Value Date   BUN 21 10/18/2022   CREATININE 0.94 10/18/2022  On benazepril.  + HL; Last set of  lipids: Lab Results  Component Value Date   CHOL 122 10/18/2022   HDL 60.50 10/18/2022   LDLCALC 36 10/18/2022   LDLDIRECT 64.0 04/21/2017   TRIG 127.0 10/18/2022   CHOLHDL 2 10/18/2022  On Vytorin and fenofibrate.  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 She has had cataract surgery.  She was admitted with blurry vision, papilledema in 02/26/2021.  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 She saw a retina specialist and a neurologist.  She was on steroids. She started IO injections.  - + numbness and tingling in her feet.  She sees podiatry-Dr. Prudence Davidson.  Last foot exam 10/2022.  She had an ulcer in lower right leg (has lymphedema) >> healed. She also has a history of HTN, GERD, depression, h/o thrombophlebitis. Previously on B12 injections now on 1000 mcg p.o. B12 daily.  In 03/2022, she lost consciousness after a vomiting episode.  She was down, on the floor, near her bed, the entire night, as she could not get up.  Since then, she ordered another alert bracelet and also an alert button for the shower.  She was not sure if her sugars were low at that time but the investigation was negative for culprits when paramedics came.  ROS: + see HPI  I reviewed pt's medications, allergies, PMH, social hx, family hx, and changes were documented  in the history of present illness. Otherwise, unchanged from my initial visit note.  Past Medical History:  Diagnosis Date   Depression    Diabetes mellitus    GERD (gastroesophageal reflux disease)    Hyperlipidemia    Hypertension    Obesity    Past Surgical History:  Procedure Laterality Date   ABDOMINAL HYSTERECTOMY  1991   BSO   IR ABLATE LIVER CRYOABLATION  12/31/2021   IR RADIOLOGIST EVAL & MGMT  12/11/2021   IR RADIOLOGIST EVAL & MGMT  01/07/2022   IR RADIOLOGIST EVAL & MGMT  02/15/2022   Social History   Socioeconomic History   Marital status: Widowed    Spouse name: Not on file    Number of children: 0   Years of education: 56   Highest education level: Some college, no degree  Occupational History   Occupation: retired from [employer]: RETIRED  Tobacco Use   Smoking status: Former    Types: Cigarettes    Quit date: 1995    Years since quitting: 29.1    Passive exposure: Past   Smokeless tobacco: Never  Vaping Use   Vaping Use: Never used  Substance and Sexual Activity   Alcohol use: No   Drug use: No   Sexual activity: Not Currently    Partners: Male  Other Topics Concern   Not on file  Social History Narrative   Lives with husband   Right Handed   Drinks 3-5 cups caffeine daily   Social Determinants of Health   Financial Resource Strain: Low Risk  (06/08/2022)   Overall Financial Resource Strain (CARDIA)    Difficulty of Paying Living Expenses: Not hard at all  Food Insecurity: No Food Insecurity (06/08/2022)   Hunger Vital Sign    Worried About Running Out of Food in the Last Year: Never true    [place] in the Last Year: Never true  Transportation Needs: No Transportation Needs (06/08/2022)   PRAPARE - Hydrologist (Medical): No    Lack of Transportation (Non-Medical): No  Physical Activity: Inactive (06/08/2022)   Exercise Vital Sign    Days of Exercise per Week: 0 days    Minutes of Exercise per Session: 0 min  Stress: Stress Concern Present (06/08/2022)   [place]    Feeling of Stress : Rather much  Social Connections: Socially Isolated (06/08/2022)   Social Connection and Isolation Panel [NHANES]    Frequency of Communication with Friends and Family: More than three times a week    Frequency of Social Gatherings with Friends and Family: Twice a week    Attends Religious Services: Never    Marine scientist or Organizations: No    Attends Archivist Meetings: Never    Marital Status: Widowed  Intimate Partner  Violence: Not At Risk (06/08/2022)   Humiliation, Afraid, Rape, and Kick questionnaire    Fear of Current or Ex-Partner: No    Emotionally Abused: No    Physically Abused: No    Sexually Abused: No   Current Outpatient Medications on File Prior to Visit  Medication Sig Dispense Refill   acetaminophen (TYLENOL) 650 MG CR tablet Take 650 mg by mouth 2 (two) times daily as needed for pain. For pain     aspirin 81 MG tablet Take 81 mg by mouth daily.     benazepril (LOTENSIN) 20 MG tablet TAKE  1 TABLET DAILY 90 tablet 1   Blood Glucose Monitoring Suppl (ONE TOUCH ULTRA SYSTEM KIT) W/DEVICE KIT 1 kit by Does not apply route once. 1 each 0   COVID-19 mRNA vaccine 2023-2024 (COMIRNATY) syringe Inject into the muscle. 0.3 mL 0   estradiol (ESTRACE) 1 MG tablet TAKE 1 TABLET DAILY 90 tablet 1   ezetimibe-simvastatin (VYTORIN) 10-40 MG tablet TAKE 1 TABLET AT BEDTIME 90 tablet 1   fenofibrate 160 MG tablet TAKE 1 TABLET DAILY 90 tablet 1   FLUoxetine (PROZAC) 20 MG tablet TAKE 3 TABLETS DAILY 270 tablet 1   glucose blood (ONETOUCH ULTRA) test strip Use to check blood sugar 3 times a day 100 each 0   insulin aspart (NOVOLOG FLEXPEN) 100 UNIT/ML FlexPen Inject 12-15 Units into the skin 3 (three) times daily with meals. 45 mL 3   Insulin Glargine (BASAGLAR KWIKPEN) 100 UNIT/ML INJECT 36 UNITS SUBCUTANEOUSLY AT BEDTIME 45 mL 3   Insulin Pen Needle (PEN NEEDLES) 32G X 4 MM MISC USE AS INSTRUCTED WITH LANCETS. 400 each 3   meloxicam (MOBIC) 15 MG tablet Take 1 tablet (15 mg total) by mouth daily. 30 tablet 0   metFORMIN (GLUCOPHAGE) 850 MG tablet Take 1 tablet in the AM, and 2 tablets in the PM. 270 tablet 3   pantoprazole (PROTONIX) 40 MG tablet Take 1 tablet (40 mg total) by mouth daily. 90 tablet 3   RSV vaccine recomb adjuvanted (AREXVY) 120 MCG/0.5ML injection Inject into the muscle. 1 each 0   Semaglutide, 2 MG/DOSE, 8 MG/3ML SOPN Inject 2 mg as directed once a week. 9 mL 3   Current  Facility-Administered Medications on File Prior to Visit  Medication Dose Route Frequency Provider Last Rate Last Admin   ipratropium-albuterol (DUONEB) 0.5-2.5 (3) MG/3ML nebulizer solution 3 mL  3 mL Nebulization Q6H Copland, Jessica C, MD   3 mL at 11/15/16 1330   Allergies  Allergen Reactions   Hydrocodone-Acetaminophen Other (See Comments)    unknown   Oxycodone Hcl Other (See Comments)    unknown   Penicillins Other (See Comments)    unknown   Prednisone     Patient has noted allergy to prednisone but she says it was a questionable history of mild nausea with prednisone and nothing more    Family History  Problem Relation Age of Onset   Macular degeneration Mother    Heart disease Mother        syncope   Aortic stenosis Mother    Lung disease Father 65       mesothelioma   Cancer Maternal Aunt        stomach   Heart disease Paternal Uncle        cabg   Diabetes Paternal Uncle    Heart disease Paternal Uncle    Diabetes Paternal Uncle    Diabetes Paternal Uncle    Heart disease Paternal Uncle    Heart disease Paternal Uncle    Heart disease Paternal Uncle    Lung cancer Other        asbestos   Diabetes Paternal Grandmother    Cancer Other        lung   PE: BP 128/82 (BP Location: Left Arm, Patient Position: Sitting, Cuff Size: Normal)   Pulse (!) 129   Ht 5' 3"$  (1.6 m)   Wt 258 lb (117 kg)   SpO2 96%   BMI 45.70 kg/m  Wt Readings from Last 3 Encounters:  12/22/22 258 lb (117  kg)  10/18/22 257 lb 9.6 oz (116.8 kg)  08/20/22 263 lb 9.6 oz (119.6 kg)   Constitutional: overweight, in NAD, walks with a walker Eyes: EOMI, no exophthalmos ENT: no thyromegaly, no cervical lymphadenopathy Cardiovascular: Tachycardia, RR, No MRG, + B LE edema Respiratory: CTA B Musculoskeletal: no deformities Skin: no rashes except stasis dermatitis of bilateral lower legs Neurological: no tremor with outstretched hands.  ASSESSMENT: 1. DM2, insulin-dependent, uncontrolled,  with complications - DR  2. Obesity class 3  3. HL  PLAN:  1. Patient with history of uncontrolled type 2 diabetes, insulin-dependent, with worse control in 2020, when she returned after an absence of 1.5 years.  At that time, she had a lot of stress and relaxed her diet and sugars were in the 200s to 400s.  We discussed about improving diet and we also increased her insulin and GLP-1 receptor agonist.  Sugars gradually improved afterwards and at last visit HbA1c was 5.8%.  Since then, she had another HbA1c that was slightly higher, at 6.2%, but still at goal. -At last visit, sugars were slowly increasing overnight and they were higher occasionally after lunch and dinner.  We increased her Ozempic dose and reduced the insulin further. CGM interpretation: -At today's visit, we reviewed her CGM downloads: It appears that 85% of values are in target range (goal >70%), while 13% are higher than 180 (goal <25%), and 2% are lower than 70 (goal <4%).  The calculated average blood sugar is 134.  The projected HbA1c for the next 3 months (GMI) is 6.5%. -Reviewing the CGM trends, sugars appear to be increasing slightly after breakfast and more so after lunch and later, around dinnertime.  They are dropping significantly after dinner so for now I advised her to reduce her NovoLog with this meal.  Since her sugars are intermittently low throughout the day, and in the lower half of the target range at bedtime and overnight, I also advised her to reduce her basal insulin.  Upon her questioning, we discussed about the advantages of staying on the metformin and Ozempic.  She agrees to continue. - I advised her to:  Patient Instructions  Please continue: - Metformin 850 mg in am and 1700 mg at night - Ozempic 2 mg weekly  Please decrease: - Basaglar 28 units at bedtime - NovoLog 10-15 before b'fast and lunch, but 8-10 units before dinner  Please return in 4 months.  - we checked her HbA1c: 5.9%  (excellent) - advised to check sugars at different times of the day - 4x a day, rotating check times - advised for yearly eye exams >> she is UTD - return to clinic in 6 months  2. Obesity class 3  -We will continue Ozempic which should also help with weight loss -She gained 4 pounds before last visit, previously lost 16 -At today's visit, she lost 5 pounds  3. HL -Reviewed latest lipid panel from 10/2022: Fractions at goal: Lab Results  Component Value Date   CHOL 122 10/18/2022   HDL 60.50 10/18/2022   LDLCALC 36 10/18/2022   LDLDIRECT 64.0 04/21/2017   TRIG 127.0 10/18/2022   CHOLHDL 2 10/18/2022  -She continues on simvastatin-ezetimibe 40-10 mg daily and fenofibrate 160 mg daily without side effects  Philemon Kingdom, MD PhD [facility] Endocrinology

## 2022-12-28 ENCOUNTER — Telehealth: Payer: Self-pay

## 2022-12-28 NOTE — Telephone Encounter (Signed)
Inbound fax Nancee Liter is not covered by insurance. Possible alternative is Lantus.

## 2022-12-30 ENCOUNTER — Encounter: Payer: Self-pay | Admitting: Internal Medicine

## 2022-12-30 DIAGNOSIS — E11319 Type 2 diabetes mellitus with unspecified diabetic retinopathy without macular edema: Secondary | ICD-10-CM

## 2022-12-30 MED ORDER — LANTUS SOLOSTAR 100 UNIT/ML ~~LOC~~ SOPN: 36.0000 [IU] | PEN_INJECTOR | Freq: Every day | SUBCUTANEOUS | 0 refills | Status: DC

## 2022-12-30 MED ORDER — INSULIN GLARGINE 100 UNITS/ML SOLOSTAR PEN: 36.0000 [IU] | PEN_INJECTOR | Freq: Every day | SUBCUTANEOUS | 0 refills | Status: DC

## 2022-12-31 NOTE — Telephone Encounter (Signed)
Rx sent. Pt advised via Mychart message.

## 2023-01-05 ENCOUNTER — Encounter: Payer: Self-pay | Admitting: Podiatry

## 2023-01-05 ENCOUNTER — Ambulatory Visit (INDEPENDENT_AMBULATORY_CARE_PROVIDER_SITE_OTHER): Payer: Medicare Other | Admitting: Podiatry

## 2023-01-05 DIAGNOSIS — Z794 Long term (current) use of insulin: Secondary | ICD-10-CM | POA: Diagnosis not present

## 2023-01-05 DIAGNOSIS — M79676 Pain in unspecified toe(s): Secondary | ICD-10-CM

## 2023-01-05 DIAGNOSIS — E1151 Type 2 diabetes mellitus with diabetic peripheral angiopathy without gangrene: Secondary | ICD-10-CM | POA: Diagnosis not present

## 2023-01-05 DIAGNOSIS — B351 Tinea unguium: Secondary | ICD-10-CM | POA: Diagnosis not present

## 2023-01-05 NOTE — Progress Notes (Signed)
This patient returns to my office for at risk foot care.  This patient requires this care by a professional since this patient will be at risk due to having diabetes and history of thrombophlebitis.  This patient says her callus on her big toes is painful walking and wearing her shoes.  She is unable to self treat.  This patient presents for at risk foot care today.  General Appearance  Alert, conversant and in no acute stress.  Vascular  Dorsalis pedis and posterior tibial  pulses are not  palpable  Bilaterally  Due to swelling..  Capillary return is within normal limits  bilaterally. Temperature is within normal limits  bilaterally.  Neurologic  Senn-Weinstein monofilament wire test diminished  bilaterally. Muscle power within normal limits bilaterally.  Nails Thick disfigured discolored nails with subungual debris  from hallux to fifth toes bilaterally. No evidence of bacterial infection or drainage bilaterally.  Orthopedic  No limitations of motion  feet .  No crepitus or effusions noted.  No bony pathology or digital deformities noted.  Skin  normotropic skin with no porokeratosis noted bilaterally.  No signs of infections or ulcers noted.   Pinch callus  B/L.   No infection or drainage noted.  Onychomycosis  Pain in right toes  Pain in left toes    Consent was obtained for treatment procedures.   Mechanical debridement of nails 1-5  bilaterally performed with a nail nipper.  Filed with dremel without incident.     Return office visit   9  weeks                  Told patient to return for periodic foot care and evaluation due to potential at risk complications.   Gardiner Barefoot DPM

## 2023-01-12 ENCOUNTER — Telehealth: Payer: Self-pay

## 2023-01-12 NOTE — Telephone Encounter (Signed)
Inbound call from DME supplier requesting form be completed and faxed with clinical notes. DME supplies ordered via [employer] through online portal.  

## 2023-01-20 ENCOUNTER — Encounter: Payer: Self-pay | Admitting: Internal Medicine

## 2023-01-20 DIAGNOSIS — E11319 Type 2 diabetes mellitus with unspecified diabetic retinopathy without macular edema: Secondary | ICD-10-CM

## 2023-01-20 MED ORDER — NOVOLOG FLEXPEN 100 UNIT/ML ~~LOC~~ SOPN: 12.0000 [IU] | PEN_INJECTOR | Freq: Three times a day (TID) | SUBCUTANEOUS | 3 refills | Status: DC

## 2023-01-26 ENCOUNTER — Encounter: Payer: Self-pay | Admitting: Internal Medicine

## 2023-01-28 ENCOUNTER — Other Ambulatory Visit: Payer: Self-pay | Admitting: Family Medicine

## 2023-01-28 DIAGNOSIS — I10 Essential (primary) hypertension: Secondary | ICD-10-CM

## 2023-02-01 ENCOUNTER — Other Ambulatory Visit (HOSPITAL_COMMUNITY): Payer: Self-pay

## 2023-02-01 ENCOUNTER — Telehealth: Payer: Self-pay | Admitting: Pharmacy Technician

## 2023-02-01 NOTE — Telephone Encounter (Signed)
Pharmacy Patient Advocate Encounter   Received notification from pt advice req msgs/CMA that prior authorization for Ozempic is required/requested.   PA submitted on 02/01/23 to (ins) [employer] via [employer] or [facility]) confirmation # [id number] Status is pending

## 2023-02-01 NOTE — Telephone Encounter (Signed)
PA requested

## 2023-02-04 NOTE — Telephone Encounter (Signed)
Pharmacy Patient Advocate Encounter  Prior Authorization for Ozempic has been approved by [employer] (ins).    PA # [id number] SS Effective dates: 02/01/23 through 01/31/2026

## 2023-02-04 NOTE — Telephone Encounter (Signed)
Pt advised.

## 2023-03-10 ENCOUNTER — Ambulatory Visit: Payer: Medicare Other | Admitting: Podiatry

## 2023-03-10 ENCOUNTER — Telehealth: Payer: Self-pay | Admitting: Podiatry

## 2023-03-10 NOTE — Telephone Encounter (Signed)
Pt left message today at 704am that she needed to cxl appt for today as she is not feeling up to coming out today.   I called pt and left message that I did receive the message and to call if she would like to r/s

## 2023-03-17 ENCOUNTER — Ambulatory Visit (INDEPENDENT_AMBULATORY_CARE_PROVIDER_SITE_OTHER): Payer: Medicare Other | Admitting: Podiatry

## 2023-03-17 ENCOUNTER — Encounter: Payer: Self-pay | Admitting: Podiatry

## 2023-03-17 DIAGNOSIS — B351 Tinea unguium: Secondary | ICD-10-CM | POA: Diagnosis not present

## 2023-03-17 DIAGNOSIS — E1151 Type 2 diabetes mellitus with diabetic peripheral angiopathy without gangrene: Secondary | ICD-10-CM

## 2023-03-17 DIAGNOSIS — M79676 Pain in unspecified toe(s): Secondary | ICD-10-CM

## 2023-03-17 DIAGNOSIS — Z794 Long term (current) use of insulin: Secondary | ICD-10-CM

## 2023-03-17 NOTE — Progress Notes (Signed)
This patient returns to my office for at risk foot care.  This patient requires this care by a professional since this patient will be at risk due to having diabetes and history of thrombophlebitis.  This patient says her callus on her big toes is painful walking and wearing her shoes.  She is unable to self treat.  This patient presents for at risk foot care today.  General Appearance  Alert, conversant and in no acute stress.  Vascular  Dorsalis pedis and posterior tibial  pulses are not  palpable  Bilaterally  Due to swelling..  Capillary return is within normal limits  bilaterally. Temperature is within normal limits  bilaterally.  Neurologic  Senn-Weinstein monofilament wire test diminished  bilaterally. Muscle power within normal limits bilaterally.  Nails Thick disfigured discolored nails with subungual debris  from hallux to fifth toes bilaterally. No evidence of bacterial infection or drainage bilaterally.  Orthopedic  No limitations of motion  feet .  No crepitus or effusions noted.  No bony pathology or digital deformities noted.  Skin  normotropic skin with no porokeratosis noted bilaterally.  No signs of infections or ulcers noted.   Pinch callus  B/L.   No infection or drainage noted.  Onychomycosis  Pain in right toes  Pain in left toes    Consent was obtained for treatment procedures.   Mechanical debridement of nails 1-5  bilaterally performed with a nail nipper.  Filed with dremel without incident.     Return office visit   9  weeks                  Told patient to return for periodic foot care and evaluation due to potential at risk complications.   Ruthanne Mcneish DPM  

## 2023-03-21 ENCOUNTER — Other Ambulatory Visit: Payer: Self-pay | Admitting: Family Medicine

## 2023-03-21 DIAGNOSIS — F418 Other specified anxiety disorders: Secondary | ICD-10-CM

## 2023-03-21 DIAGNOSIS — Z780 Asymptomatic menopausal state: Secondary | ICD-10-CM

## 2023-03-23 ENCOUNTER — Other Ambulatory Visit: Payer: Self-pay | Admitting: Internal Medicine

## 2023-03-23 DIAGNOSIS — E11319 Type 2 diabetes mellitus with unspecified diabetic retinopathy without macular edema: Secondary | ICD-10-CM

## 2023-05-06 ENCOUNTER — Ambulatory Visit: Payer: Medicare Other | Admitting: Internal Medicine

## 2023-05-11 ENCOUNTER — Other Ambulatory Visit: Payer: Self-pay | Admitting: Family Medicine

## 2023-05-11 DIAGNOSIS — E785 Hyperlipidemia, unspecified: Secondary | ICD-10-CM

## 2023-05-12 ENCOUNTER — Ambulatory Visit: Payer: Medicare Other | Admitting: Podiatry

## 2023-05-19 ENCOUNTER — Ambulatory Visit (INDEPENDENT_AMBULATORY_CARE_PROVIDER_SITE_OTHER): Payer: Medicare Other | Admitting: Podiatry

## 2023-05-19 ENCOUNTER — Encounter: Payer: Self-pay | Admitting: Podiatry

## 2023-05-19 DIAGNOSIS — M79676 Pain in unspecified toe(s): Secondary | ICD-10-CM

## 2023-05-19 DIAGNOSIS — L84 Corns and callosities: Secondary | ICD-10-CM | POA: Diagnosis not present

## 2023-05-19 DIAGNOSIS — Z794 Long term (current) use of insulin: Secondary | ICD-10-CM

## 2023-05-19 DIAGNOSIS — B351 Tinea unguium: Secondary | ICD-10-CM

## 2023-05-19 DIAGNOSIS — E1151 Type 2 diabetes mellitus with diabetic peripheral angiopathy without gangrene: Secondary | ICD-10-CM

## 2023-05-19 NOTE — Progress Notes (Signed)
This patient returns to my office for at risk foot care.  This patient requires this care by a professional since this patient will be at risk due to having diabetes and history of thrombophlebitis.  This patient says her callus on her big toes is painful walking and wearing her shoes.  She is unable to self treat.  This patient presents for at risk foot care today.  General Appearance  Alert, conversant and in no acute stress.  Vascular  Dorsalis pedis and posterior tibial  pulses are not  palpable  Bilaterally  Due to swelling..  Capillary return is within normal limits  bilaterally. Temperature is within normal limits  bilaterally.  Neurologic  Senn-Weinstein monofilament wire test diminished  bilaterally. Muscle power within normal limits bilaterally.  Nails Thick disfigured discolored nails with subungual debris  from hallux to fifth toes bilaterally. No evidence of bacterial infection or drainage bilaterally.  Orthopedic  No limitations of motion  feet .  No crepitus or effusions noted.  No bony pathology or digital deformities noted.  Skin  normotropic skin with no porokeratosis noted bilaterally.  No signs of infections or ulcers noted.   Pinch callus  B/L.   No infection or drainage noted.  Onychomycosis  Pain in right toes  Pain in left toes  Pinch callus  B/l.  Consent was obtained for treatment procedures.   Mechanical debridement of nails 1-5  bilaterally performed with a nail nipper.  Filed with dremel without incident.  Debride callus with dremel tool.   Return office visit   9  weeks                  Told patient to return for periodic foot care and evaluation due to potential at risk complications.   Helane Gunther DPM

## 2023-05-20 ENCOUNTER — Encounter: Payer: Self-pay | Admitting: Family Medicine

## 2023-05-20 DIAGNOSIS — E785 Hyperlipidemia, unspecified: Secondary | ICD-10-CM

## 2023-05-20 MED ORDER — FENOFIBRATE 160 MG PO TABS
ORAL_TABLET | ORAL | 1 refills | Status: DC
Start: 2023-05-20 — End: 2023-10-18

## 2023-06-01 ENCOUNTER — Encounter (INDEPENDENT_AMBULATORY_CARE_PROVIDER_SITE_OTHER): Payer: Self-pay

## 2023-06-04 ENCOUNTER — Encounter: Payer: Self-pay | Admitting: Internal Medicine

## 2023-06-05 ENCOUNTER — Encounter: Payer: Self-pay | Admitting: Internal Medicine

## 2023-06-08 ENCOUNTER — Telehealth: Payer: Self-pay | Admitting: Family Medicine

## 2023-06-08 NOTE — Telephone Encounter (Signed)
Copied from CRM [id number]. Topic: Medicare AWV >> Jun 08, 2023  3:58 PM Payton Doughty wrote: Reason for CRM: LVM 06/08/23 to r/s AWV appt. New appt date 06/20/23 at 1:40pm, please confirm new appt date khc  Verlee Rossetti; Care Guide Ambulatory Clinical Support [place] [facility] Direct Dial: [phone]

## 2023-06-20 ENCOUNTER — Ambulatory Visit (INDEPENDENT_AMBULATORY_CARE_PROVIDER_SITE_OTHER): Payer: Medicare Other | Admitting: *Deleted

## 2023-06-20 DIAGNOSIS — Z0000 Encounter for general adult medical examination without abnormal findings: Secondary | ICD-10-CM | POA: Diagnosis not present

## 2023-06-20 NOTE — Patient Instructions (Signed)
Andrea Marks , Thank you for taking time to come for your Medicare Wellness Visit. I appreciate your ongoing commitment to your health goals. Please review the following plan we discussed and let me know if I can assist you in the future.     This is a list of the screening recommended for you and due dates:  Health Maintenance  Topic Date Due   Colon Cancer Screening  02/16/2021   DTaP/Tdap/Td vaccine (3 - Td or Tdap) 06/07/2021   COVID-19 Vaccine (6 - 2023-24 season) 02/17/2023   Mammogram  12/20/2023*   DEXA scan (bone density measurement)  12/20/2023*   Hepatitis C Screening  12/20/2023*   Flu Shot  08/21/2024*   Hemoglobin A1C  06/22/2023   Complete foot exam   10/05/2023   Yearly kidney function blood test for diabetes  10/19/2023   Yearly kidney health urinalysis for diabetes  10/19/2023   Eye exam for diabetics  11/23/2023   Medicare Annual Wellness Visit  06/19/2024   Pneumonia Vaccine  Completed   Zoster (Shingles) Vaccine  Completed   HPV Vaccine  Aged Out  *Topic was postponed. The date shown is not the original due date.    Next appointment: Follow up in one year for your annual wellness visit.   Preventive Care 16 Years and Older, Female Preventive care refers to lifestyle choices and visits with your health care provider that can promote health and wellness. What does preventive care include? A yearly physical exam. This is also called an annual well check. Dental exams once or twice a year. Routine eye exams. Ask your health care provider how often you should have your eyes checked. Personal lifestyle choices, including: Daily care of your teeth and gums. Regular physical activity. Eating a healthy diet. Avoiding tobacco and drug use. Limiting alcohol use. Practicing safe sex. Taking low-dose aspirin every day. Taking vitamin and mineral supplements as recommended by your health care provider. What happens during an annual well check? The services and  screenings done by your health care provider during your annual well check will depend on your age, overall health, lifestyle risk factors, and family history of disease. Counseling  Your health care provider may ask you questions about your: Alcohol use. Tobacco use. Drug use. Emotional well-being. Home and relationship well-being. Sexual activity. Eating habits. History of falls. Memory and ability to understand (cognition). Work and work Astronomer. Reproductive health. Screening  You may have the following tests or measurements: Height, weight, and BMI. Blood pressure. Lipid and cholesterol levels. These may be checked every 5 years, or more frequently if you are over 86 years old. Skin check. Lung cancer screening. You may have this screening every year starting at age 56 if you have a 30-pack-year history of smoking and currently smoke or have quit within the past 15 years. Fecal occult blood test (FOBT) of the stool. You may have this test every year starting at age 40. Flexible sigmoidoscopy or colonoscopy. You may have a sigmoidoscopy every 5 years or a colonoscopy every 10 years starting at age 51. Hepatitis C blood test. Hepatitis B blood test. Sexually transmitted disease (STD) testing. Diabetes screening. This is done by checking your blood sugar (glucose) after you have not eaten for a while (fasting). You may have this done every 1-3 years. Bone density scan. This is done to screen for osteoporosis. You may have this done starting at age 77. Mammogram. This may be done every 1-2 years. Talk to your health care  provider about how often you should have regular mammograms. Talk with your health care provider about your test results, treatment options, and if necessary, the need for more tests. Vaccines  Your health care provider may recommend certain vaccines, such as: Influenza vaccine. This is recommended every year. Tetanus, diphtheria, and acellular pertussis (Tdap,  Td) vaccine. You may need a Td booster every 10 years. Zoster vaccine. You may need this after age 41. Pneumococcal 13-valent conjugate (PCV13) vaccine. One dose is recommended after age 37. Pneumococcal polysaccharide (PPSV23) vaccine. One dose is recommended after age 81. Talk to your health care provider about which screenings and vaccines you need and how often you need them. This information is not intended to replace advice given to you by your health care provider. Make sure you discuss any questions you have with your health care provider. Document Released: 11/14/2015 Document Revised: 07/07/2016 Document Reviewed: 08/19/2015 Elsevier Interactive Patient Education  2017 [employer].  Fall Prevention in the Home Falls can cause injuries. They can happen to people of all ages. There are many things you can do to make your home safe and to help prevent falls. What can I do on the outside of my home? Regularly fix the edges of walkways and driveways and fix any cracks. Remove anything that might make you trip as you walk through a door, such as a raised step or threshold. Trim any bushes or trees on the path to your home. Use bright outdoor lighting. Clear any walking paths of anything that might make someone trip, such as rocks or tools. Regularly check to see if handrails are loose or broken. Make sure that both sides of any steps have handrails. Any raised decks and porches should have guardrails on the edges. Have any leaves, snow, or ice cleared regularly. Use sand or salt on walking paths during winter. Clean up any spills in your garage right away. This includes oil or grease spills. What can I do in the bathroom? Use night lights. Install grab bars by the toilet and in the tub and shower. Do not use towel bars as grab bars. Use non-skid mats or decals in the tub or shower. If you need to sit down in the shower, use a plastic, non-slip stool. Keep the floor dry. Clean up any  water that spills on the floor as soon as it happens. Remove soap buildup in the tub or shower regularly. Attach bath mats securely with double-sided non-slip rug tape. Do not have throw rugs and other things on the floor that can make you trip. What can I do in the bedroom? Use night lights. Make sure that you have a light by your bed that is easy to reach. Do not use any sheets or blankets that are too big for your bed. They should not hang down onto the floor. Have a firm chair that has side arms. You can use this for support while you get dressed. Do not have throw rugs and other things on the floor that can make you trip. What can I do in the kitchen? Clean up any spills right away. Avoid walking on wet floors. Keep items that you use a lot in easy-to-reach places. If you need to reach something above you, use a strong step stool that has a grab bar. Keep electrical cords out of the way. Do not use floor polish or wax that makes floors slippery. If you must use wax, use non-skid floor wax. Do not have throw rugs  and other things on the floor that can make you trip. What can I do with my stairs? Do not leave any items on the stairs. Make sure that there are handrails on both sides of the stairs and use them. Fix handrails that are broken or loose. Make sure that handrails are as long as the stairways. Check any carpeting to make sure that it is firmly attached to the stairs. Fix any carpet that is loose or worn. Avoid having throw rugs at the top or bottom of the stairs. If you do have throw rugs, attach them to the floor with carpet tape. Make sure that you have a light switch at the top of the stairs and the bottom of the stairs. If you do not have them, ask someone to add them for you. What else can I do to help prevent falls? Wear shoes that: Do not have high heels. Have rubber bottoms. Are comfortable and fit you well. Are closed at the toe. Do not wear sandals. If you use a  stepladder: Make sure that it is fully opened. Do not climb a closed stepladder. Make sure that both sides of the stepladder are locked into place. Ask someone to hold it for you, if possible. Clearly mark and make sure that you can see: Any grab bars or handrails. First and last steps. Where the edge of each step is. Use tools that help you move around (mobility aids) if they are needed. These include: Canes. Walkers. Scooters. Crutches. Turn on the lights when you go into a dark area. Replace any light bulbs as soon as they burn out. Set up your furniture so you have a clear path. Avoid moving your furniture around. If any of your floors are uneven, fix them. If there are any pets around you, be aware of where they are. Review your medicines with your doctor. Some medicines can make you feel dizzy. This can increase your chance of falling. Ask your doctor what other things that you can do to help prevent falls. This information is not intended to replace advice given to you by your health care provider. Make sure you discuss any questions you have with your health care provider. Document Released: 08/14/2009 Document Revised: 03/25/2016 Document Reviewed: 11/22/2014 Elsevier Interactive Patient Education  2017 [employer].

## 2023-06-20 NOTE — Progress Notes (Signed)
Subjective:   Andrea Marks is a 72 y.o. female who presents for Medicare Annual (Subsequent) preventive examination.  Visit Complete: Virtual  I connected with  Andrea Marks on 06/20/23 by a audio enabled telemedicine application and verified that I am speaking with the correct person using two identifiers.  Patient Location: Home  Provider Location: Office/Clinic  I discussed the limitations of evaluation and management by telemedicine. The patient expressed understanding and agreed to proceed.  Patient Medicare AWV questionnaire was completed by the patient on 06/13/23; I have confirmed that all information answered by patient is correct and no changes since this date.  Review of Systems    Cardiac Risk Factors include: advanced age (>71men, >15 women);diabetes mellitus;dyslipidemia;hypertension;obesity (BMI >30kg/m2)     Objective:    Vital Signs: Unable to obtain new vitals due to this being a telehealth visit.      06/20/2023    1:43 PM 06/08/2022    1:07 PM 10/13/2021    4:19 PM 05/27/2021    9:06 AM 02/27/2021    3:56 PM 02/27/2021    3:11 AM 05/16/2020   11:11 AM  Advanced Directives  Does Patient Have a Medical Advance Directive? Yes Yes No No  No No  Type of Estate agent of [place];Living will Living will       Does patient want to make changes to medical advance directive? No - Patient declined        Copy of Healthcare Power of Attorney in Chart? No - copy requested        Would patient like information on creating a medical advance directive?   No - Patient declined Yes (MAU/Ambulatory/Procedural Areas - Information given) No - Patient declined  Yes (MAU/Ambulatory/Procedural Areas - Information given)    Current Medications (verified) Outpatient Encounter Medications as of 06/20/2023  Medication Sig   acetaminophen (TYLENOL) 650 MG CR tablet Take 650 mg by mouth 2 (two) times daily as needed for pain. For pain    aspirin 81 MG tablet Take 81 mg by mouth daily.   benazepril (LOTENSIN) 20 MG tablet TAKE 1 TABLET DAILY   Blood Glucose Monitoring Suppl (ONE TOUCH ULTRA SYSTEM KIT) W/DEVICE KIT 1 kit by Does not apply route once.   COVID-19 mRNA vaccine 2023-2024 (COMIRNATY) syringe Inject into the muscle.   estradiol (ESTRACE) 1 MG tablet TAKE 1 TABLET DAILY   ezetimibe-simvastatin (VYTORIN) 10-40 MG tablet Take 1 tablet by mouth at bedtime.   fenofibrate 160 MG tablet TAKE 1 TABLET DAILY   FLUoxetine (PROZAC) 20 MG tablet TAKE 3 TABLETS DAILY   glucose blood (ONETOUCH ULTRA) test strip Use to check blood sugar 3 times a day   insulin aspart (NOVOLOG FLEXPEN) 100 UNIT/ML FlexPen Inject 12-15 Units into the skin 3 (three) times daily with meals.   insulin glargine (LANTUS) 100 unit/mL SOPN Inject 36 Units into the skin daily.   Insulin Pen Needle (PEN NEEDLES) 32G X 4 MM MISC USE AS INSTRUCTED WITH LANCETS.   LANTUS SOLOSTAR 100 UNIT/ML Solostar Pen INJECT 36 UNITS            SUBCUTANEOUSLY DAILY   meloxicam (MOBIC) 15 MG tablet Take 1 tablet (15 mg total) by mouth daily.   metFORMIN (GLUCOPHAGE) 850 MG tablet Take 1 tablet in the AM, and 2 tablets in the PM.   pantoprazole (PROTONIX) 40 MG tablet Take 1 tablet (40 mg total) by mouth daily.   RSV vaccine recomb adjuvanted (AREXVY) 120 MCG/0.5ML  injection Inject into the muscle.   Semaglutide, 2 MG/DOSE, 8 MG/3ML SOPN Inject 2 mg as directed once a week.   Facility-Administered Encounter Medications as of 06/20/2023  Medication   ipratropium-albuterol (DUONEB) 0.5-2.5 (3) MG/3ML nebulizer solution 3 mL    Allergies (verified) Hydrocodone-acetaminophen, Oxycodone hcl, Penicillins, and Prednisone   History: Past Medical History:  Diagnosis Date   Allergy    Anxiety    Arthritis    Cataract    Depression    Diabetes mellitus    GERD (gastroesophageal reflux disease)    Hyperlipidemia    Hypertension    Obesity    Past Surgical History:   Procedure Laterality Date   ABDOMINAL HYSTERECTOMY  11/01/1989   BSO   EYE SURGERY     IR ABLATE LIVER CRYOABLATION  12/31/2021   IR RADIOLOGIST EVAL & MGMT  12/11/2021   IR RADIOLOGIST EVAL & MGMT  01/07/2022   IR RADIOLOGIST EVAL & MGMT  02/15/2022   Family History  Problem Relation Age of Onset   Macular degeneration Mother    Heart disease Mother        syncope   Aortic stenosis Mother    Lung disease Father 52       mesothelioma   Cancer Maternal Aunt        stomach   Heart disease Paternal Uncle        cabg   Diabetes Paternal Uncle    Heart disease Paternal Uncle    Diabetes Paternal Uncle    Diabetes Paternal Uncle    Heart disease Paternal Uncle    Heart disease Paternal Uncle    Heart disease Paternal Uncle    Lung cancer Other        asbestos   Diabetes Paternal Grandmother    Cancer Other        lung   Social History   Socioeconomic History   Marital status: Widowed    Spouse name: Not on file   Number of children: 0   Years of education: 54   Highest education level: Some college, no degree  Occupational History   Occupation: retired from IT consultant: RETIRED  Tobacco Use   Smoking status: Former    Current packs/day: 0.00    Types: Cigarettes    Quit date: 1995    Years since quitting: 29.6    Passive exposure: Past   Smokeless tobacco: Never  Vaping Use   Vaping status: Never Used  Substance and Sexual Activity   Alcohol use: No   Drug use: No   Sexual activity: Not Currently    Partners: Male  Other Topics Concern   Not on file  Social History Narrative   Lives with husband   Right Handed   Drinks 3-5 cups caffeine daily   Social Determinants of Health   Financial Resource Strain: Low Risk  (06/13/2023)   Overall Financial Resource Strain (CARDIA)    Difficulty of Paying Living Expenses: Not hard at all  Food Insecurity: No Food Insecurity (06/13/2023)   Hunger Vital Sign    Worried About Running Out of Food in the  Last Year: Never true    Ran Out of Food in the Last Year: Never true  Transportation Needs: No Transportation Needs (06/13/2023)   PRAPARE - Administrator, Civil Service (Medical): No    Lack of Transportation (Non-Medical): No  Physical Activity: Inactive (06/13/2023)   Exercise Vital Sign    Days of  Exercise per Week: 0 days    Minutes of Exercise per Session: 0 min  Stress: Stress Concern Present (06/13/2023)   [employer] of Occupational Health - Occupational Stress Questionnaire    Feeling of Stress : To some extent  Social Connections: Unknown (06/13/2023)   Social Connection and Isolation Panel [NHANES]    Frequency of Communication with Friends and Family: More than three times a week    Frequency of Social Gatherings with Friends and Family: Once a week    Attends Religious Services: Not on Marketing executive or Organizations: No    Attends Banker Meetings: Never    Marital Status: Widowed    Tobacco Counseling Counseling given: Not Answered   Clinical Intake:  Pre-visit preparation completed: Yes  Pain : No/denies pain  Nutritional Risks: None Diabetes: Yes CBG done?: No Did pt. bring in CBG monitor from home?: No  How often do you need to have someone help you when you read instructions, pamphlets, or other written materials from your doctor or pharmacy?: 2 - Rarely  Interpreter Needed?: No  Information entered by :: Donne Anon, CMA   Activities of Daily Living    06/13/2023    8:37 AM 06/07/2023   12:12 PM  In your present state of health, do you have any difficulty performing the following activities:  Hearing? 0 0  Vision? 1 1  Difficulty concentrating or making decisions? 0 0  Walking or climbing stairs? 1 1  Dressing or bathing? 0 0  Doing errands, shopping? 1 1  Comment doesn't drive at night or when it's Engineer, structural and eating ? N N  Using the Toilet? N N  In the past six months, have  you accidently leaked urine? Y Y  Do you have problems with loss of bowel control? N N  Managing your Medications? N N  Managing your Finances? N N  Housekeeping or managing your Housekeeping? Malvin Johns    Patient Care Team: Zola Button, Grayling Congress, DO as PCP - General Helane Gunther, DPM as Consulting Physician (Podiatry) Sinda Du, MD as Consulting Physician (Ophthalmology) Carlus Pavlov, MD as Consulting Physician (Internal Medicine) Judi Saa, DO as Consulting Physician (Family Medicine) Stephannie Li, MD as Consulting Physician (Ophthalmology)  Indicate any recent Medical Services you may have received from other than [facility] providers in the past year (date may be approximate).     Assessment:   This is a routine wellness examination for [place].  Hearing/Vision screen No results found.  Dietary issues and exercise activities discussed:     Goals Addressed   None    Depression Screen    06/20/2023    1:49 PM 06/08/2022    1:06 PM 10/13/2021    2:03 PM 09/29/2021    4:13 PM 05/27/2021    9:09 AM 03/19/2021   11:16 AM 05/16/2020   11:21 AM  PHQ 2/9 Scores  PHQ - 2 Score 1 1 3 3 1 2 1   PHQ- 9 Score   6 6       Fall Risk    06/13/2023    8:37 AM 06/07/2023   12:12 PM 06/08/2022    1:08 PM 06/01/2022    9:14 AM 10/13/2021    4:17 PM  Fall Risk   Falls in the past year? 0 0 1 1 0  Number falls in past yr: 0 0 1 0 0  Injury with Fall? 0 0 0  0 0  Risk for fall due to : No Fall Risks  Impaired vision;Impaired mobility;Impaired balance/gait  No Fall Risks  Risk for fall due to: Comment   left knee    Follow up Falls evaluation completed  Falls prevention discussed  Falls evaluation completed;Education provided;Falls prevention discussed    MEDICARE RISK AT HOME: Medicare Risk at Home Any stairs in or around the home?: Yes If so, are there any without handrails?: No Home free of loose throw rugs in walkways, pet beds, electrical cords, etc?: Yes Adequate  lighting in your home to reduce risk of falls?: Yes Life alert?: Yes Use of a cane, walker or w/c?: Yes Grab bars in the bathroom?: Yes Shower chair or bench in shower?: No Elevated toilet seat or a handicapped toilet?: No  TIMED UP AND GO:  Was the test performed?  No    Cognitive Function:        06/20/2023    1:53 PM  6CIT Screen  What Year? 0 points  What month? 0 points  What time? 0 points  Count back from 20 0 points  Months in reverse 0 points  Repeat phrase 0 points  Total Score 0 points    Immunizations Immunization History  Administered Date(s) Administered   COVID-19, mRNA, vaccine(Comirnaty)12 years and older 10/18/2022   PFIZER Comirnaty(Gray Top)Covid-19 Tri-Sucrose Vaccine 03/19/2021   PFIZER(Purple Top)SARS-COV-2 Vaccination 01/06/2020, 02/06/2020, 09/22/2020   Pneumococcal Conjugate-13 08/27/2016   Pneumococcal Polysaccharide-23 05/08/2003, 11/10/2009, 12/15/2018   Td 05/08/2003   Tdap 06/08/2011   Zoster Recombinant(Shingrix) 03/12/2020, 05/16/2020, 05/16/2020   Zoster, Live 02/10/2012    TDAP status: Due, Education has been provided regarding the importance of this vaccine. Advised may receive this vaccine at local pharmacy or Health Dept. Aware to provide a copy of the vaccination record if obtained from local pharmacy or Health Dept. Verbalized acceptance and understanding.  Flu Vaccine status: Due, Education has been provided regarding the importance of this vaccine. Advised may receive this vaccine at local pharmacy or Health Dept. Aware to provide a copy of the vaccination record if obtained from local pharmacy or Health Dept. Verbalized acceptance and understanding.  Pneumococcal vaccine status: Up to date  Covid-19 vaccine status: Information provided on how to obtain vaccines.   Qualifies for Shingles Vaccine? Yes   Zostavax completed Yes   Shingrix Completed?: Yes  Screening Tests Health Maintenance  Topic Date Due   Colonoscopy   02/16/2021   DTaP/Tdap/Td (3 - Td or Tdap) 06/07/2021   COVID-19 Vaccine (6 - 2023-24 season) 02/17/2023   Medicare Annual Wellness (AWV)  06/09/2023   MAMMOGRAM  12/20/2023 (Originally 02/24/2012)   DEXA SCAN  12/20/2023 (Originally 02/19/2016)   Hepatitis C Screening  12/20/2023 (Originally 02/18/1969)   INFLUENZA VACCINE  08/21/2024 (Originally 06/02/2023)   HEMOGLOBIN A1C  06/22/2023   FOOT EXAM  10/05/2023   Diabetic kidney evaluation - eGFR measurement  10/19/2023   Diabetic kidney evaluation - Urine ACR  10/19/2023   OPHTHALMOLOGY EXAM  11/23/2023   Pneumonia Vaccine 43+ Years old  Completed   Zoster Vaccines- Shingrix  Completed   HPV VACCINES  Aged Out    Health Maintenance  Health Maintenance Due  Topic Date Due   Colonoscopy  02/16/2021   DTaP/Tdap/Td (3 - Td or Tdap) 06/07/2021   COVID-19 Vaccine (6 - 2023-24 season) 02/17/2023   Medicare Annual Wellness (AWV)  06/09/2023    Colorectal cancer screen: pt declined  Mammogram status: pt declined  Bone Density status: pt  declined  Lung Cancer Screening: (Low Dose CT Chest recommended if Age 73-80 years, 20 pack-year currently smoking OR have quit w/in 15years.) does not qualify.   Additional Screening:  Hepatitis C Screening: does qualify; Completed N/a  Vision Screening: Recommended annual ophthalmology exams for early detection of glaucoma and other disorders of the eye. Is the patient up to date with their annual eye exam?  Yes  Who is the provider or what is the name of the office in which the patient attends annual eye exams? Dr. Stephannie Li If pt is not established with a provider, would they like to be referred to a provider to establish care? No .   Dental Screening: Recommended annual dental exams for proper oral hygiene  Diabetic Foot Exam: Diabetic Foot Exam: Completed 10/04/22  Community Resource Referral / Chronic Care Management: CRR required this visit?  No   CCM required this visit?  No      Plan:     I have personally reviewed and noted the following in the patient's chart:   Medical and social history Use of alcohol, tobacco or illicit drugs  Current medications and supplements including opioid prescriptions. Patient is not currently taking opioid prescriptions. Functional ability and status Nutritional status Physical activity Advanced directives List of other physicians Hospitalizations, surgeries, and ER visits in previous 12 months Vitals Screenings to include cognitive, depression, and falls Referrals and appointments  In addition, I have reviewed and discussed with patient certain preventive protocols, quality metrics, and best practice recommendations. A written personalized care plan for preventive services as well as general preventive health recommendations were provided to patient.     Donne Anon, CMA   06/20/2023   After Visit Summary: (MyChart) Due to this being a telephonic visit, the after visit summary with patients personalized plan was offered to patient via MyChart   Nurse Notes: None

## 2023-07-13 ENCOUNTER — Other Ambulatory Visit: Payer: Self-pay | Admitting: Family Medicine

## 2023-07-13 DIAGNOSIS — I10 Essential (primary) hypertension: Secondary | ICD-10-CM

## 2023-07-15 ENCOUNTER — Encounter: Payer: Self-pay | Admitting: Family Medicine

## 2023-07-15 DIAGNOSIS — I10 Essential (primary) hypertension: Secondary | ICD-10-CM

## 2023-07-15 MED ORDER — BENAZEPRIL HCL 20 MG PO TABS
20.0000 mg | ORAL_TABLET | Freq: Every day | ORAL | 1 refills | Status: DC
Start: 2023-07-15 — End: 2023-10-18

## 2023-07-21 ENCOUNTER — Encounter: Payer: Self-pay | Admitting: Podiatry

## 2023-07-21 ENCOUNTER — Ambulatory Visit (INDEPENDENT_AMBULATORY_CARE_PROVIDER_SITE_OTHER): Payer: Medicare Other | Admitting: Podiatry

## 2023-07-21 DIAGNOSIS — L84 Corns and callosities: Secondary | ICD-10-CM

## 2023-07-21 DIAGNOSIS — B351 Tinea unguium: Secondary | ICD-10-CM | POA: Diagnosis not present

## 2023-07-21 DIAGNOSIS — E1151 Type 2 diabetes mellitus with diabetic peripheral angiopathy without gangrene: Secondary | ICD-10-CM

## 2023-07-21 DIAGNOSIS — M79676 Pain in unspecified toe(s): Secondary | ICD-10-CM | POA: Diagnosis not present

## 2023-07-21 DIAGNOSIS — Z794 Long term (current) use of insulin: Secondary | ICD-10-CM | POA: Diagnosis not present

## 2023-07-21 NOTE — Progress Notes (Signed)
This patient returns to my office for at risk foot care.  This patient requires this care by a professional since this patient will be at risk due to having diabetes and history of thrombophlebitis.  This patient says her callus on her big toes is painful walking and wearing her shoes.  She is unable to self treat.  This patient presents for at risk foot care today.  General Appearance  Alert, conversant and in no acute stress.  Vascular  Dorsalis pedis and posterior tibial  pulses are not  palpable  Bilaterally  Due to swelling..  Capillary return is within normal limits  bilaterally. Temperature is within normal limits  bilaterally.  Neurologic  Senn-Weinstein monofilament wire test diminished  bilaterally. Muscle power within normal limits bilaterally.  Nails Thick disfigured discolored nails with subungual debris  from hallux to fifth toes bilaterally. No evidence of bacterial infection or drainage bilaterally.  Orthopedic  No limitations of motion  feet .  No crepitus or effusions noted.  No bony pathology or digital deformities noted.  Skin  normotropic skin with no porokeratosis noted bilaterally.  No signs of infections or ulcers noted.   Pinch callus  B/L.   No infection or drainage noted.  Onychomycosis  Pain in right toes  Pain in left toes  Pinch callus  B/l.  Consent was obtained for treatment procedures.   Mechanical debridement of nails 1-5  bilaterally performed with a nail nipper.  Filed with dremel without incident.  Debride callus with dremel tool.   Return office visit   9  weeks                  Told patient to return for periodic foot care and evaluation due to potential at risk complications.   Helane Gunther DPM

## 2023-08-03 ENCOUNTER — Other Ambulatory Visit: Payer: Self-pay | Admitting: Family Medicine

## 2023-08-03 ENCOUNTER — Other Ambulatory Visit: Payer: Self-pay | Admitting: Internal Medicine

## 2023-08-03 DIAGNOSIS — E785 Hyperlipidemia, unspecified: Secondary | ICD-10-CM

## 2023-08-03 NOTE — Telephone Encounter (Signed)
Ozempic refill request complete

## 2023-08-04 ENCOUNTER — Encounter: Payer: Self-pay | Admitting: Internal Medicine

## 2023-08-04 ENCOUNTER — Ambulatory Visit (INDEPENDENT_AMBULATORY_CARE_PROVIDER_SITE_OTHER): Payer: Medicare Other | Admitting: Internal Medicine

## 2023-08-04 VITALS — BP 148/86 | HR 76 | Resp 18 | Ht 63.0 in | Wt 269.6 lb

## 2023-08-04 DIAGNOSIS — E1165 Type 2 diabetes mellitus with hyperglycemia: Secondary | ICD-10-CM

## 2023-08-04 DIAGNOSIS — E785 Hyperlipidemia, unspecified: Secondary | ICD-10-CM

## 2023-08-04 DIAGNOSIS — Z794 Long term (current) use of insulin: Secondary | ICD-10-CM

## 2023-08-04 DIAGNOSIS — E11319 Type 2 diabetes mellitus with unspecified diabetic retinopathy without macular edema: Secondary | ICD-10-CM

## 2023-08-04 LAB — LIPID PANEL
Cholesterol: 114 mg/dL (ref 0–200)
HDL: 57 mg/dL (ref >39.00)
LDL Cholesterol: 31 mg/dL (ref 0–99)
NonHDL: 56.86
Total CHOL/HDL Ratio: 2
Triglycerides: 127 mg/dL (ref 0.0–149.0)
VLDL: 25.4 mg/dL (ref 0.0–40.0)

## 2023-08-04 LAB — COMPREHENSIVE METABOLIC PANEL
ALT: 11 U/L (ref 0–35)
AST: 16 U/L (ref 0–37)
Albumin: 3.8 g/dL (ref 3.5–5.2)
Alkaline Phosphatase: 26 U/L — ABNORMAL LOW (ref 39–117)
BUN: 21 mg/dL (ref 6–23)
CO2: 30 meq/L (ref 19–32)
Calcium: 9.4 mg/dL (ref 8.4–10.5)
Chloride: 103 meq/L (ref 96–112)
Creatinine, Ser: 1 mg/dL (ref 0.40–1.20)
GFR: 56.3 mL/min — ABNORMAL LOW (ref >60.00)
Glucose, Bld: 119 mg/dL — ABNORMAL HIGH (ref 70–99)
Potassium: 4.5 meq/L (ref 3.5–5.1)
Sodium: 140 meq/L (ref 135–145)
Total Bilirubin: 0.5 mg/dL (ref 0.2–1.2)
Total Protein: 5.8 g/dL — ABNORMAL LOW (ref 6.0–8.3)

## 2023-08-04 LAB — POCT GLYCOSYLATED HEMOGLOBIN (HGB A1C): Hemoglobin A1C: 6.6 % — AB (ref 4.0–5.6)

## 2023-08-04 LAB — MICROALBUMIN / CREATININE URINE RATIO
Creatinine,U: 192.7 mg/dL
Microalb Creat Ratio: 12.3 mg/g (ref 0.0–30.0)
Microalb, Ur: 23.7 mg/dL — ABNORMAL HIGH (ref 0.0–1.9)

## 2023-08-04 MED ORDER — METFORMIN HCL 850 MG PO TABS
ORAL_TABLET | ORAL | 3 refills | Status: DC
Start: 2023-08-04 — End: 2024-01-12

## 2023-08-04 MED ORDER — LANTUS SOLOSTAR 100 UNIT/ML ~~LOC~~ SOPN: 28.0000 [IU] | PEN_INJECTOR | Freq: Every day | SUBCUTANEOUS | Status: DC

## 2023-08-04 NOTE — Progress Notes (Addendum)
Patient ID: Andrea Marks, female   DOB: [birth year], 72 y.o.   MRN: [id number]  HPI: Andrea Marks is a 72 y.o.-year-old female, returning for f/u for DM2, dx 1994, insulin-dependent, uncontrolled, with complications (diabetic retinopathy). Last visit was 7 months ago.  Interim history: No increased urination, nausea, chest pain.  She has B knee pain (has 2 fractures in R knee -  had a nerve ablation). Since last OV, she had to restart steroid injections - 3 weeks ago, will continue every 3 months. She had L trigger finger surgery 1 mo ago. She continues to go regularly for intraocular injections. She relaxed her diet over the summer.  Reviewed HbA1c levels: Lab Results  Component Value Date   HGBA1C 5.9 (A) 12/22/2022   HGBA1C 6.2 10/18/2022   HGBA1C 5.8 (A) 08/20/2022   She is on: - Metformin 850 mg in am and  1700 mg at night - Ozempic 1 >> 2 mg weekly - Basaglar/Lantus 70 >> 60 >> 50 >> 36 >> 28 units at bedtime - NovoLog 3x a day before meals 15-18 units before meals and 5 to 8 units before a snack >> 12-15 (18) units before meals >> 10-15 before b'fast and lunch, but 8-10 units before dinner Tried Bydureon once a week >> she had problems injecting the medication.  She also tried Victoza. She was on Humalog in the past. She was on Amaryl in the past, which we stopped going to increase her insulin doses 05/2018. She was on a Vgo pump, which we stopped in 2019 as she needed higher doses of insulin.  She checks her sugars more than 4 times a day with her freestyle libre 2 CGM (with receiver - she shut down the alarms):  Previously:  Previously:   Lowest blood sugars: 50s >> 54 >> 58 >> 41. Highest blood sugars: 200s >> 300s >> 200s >> 300s.  Meals: - Breakfast: egg + bagel + yoghurt - Lunch: may skip, salad - Dinner: salad, hamburger/hot dog, tuna fish - Snacks: potato chips, pretzel No sodas, only drinks sparkling water.  No CKD, last  BUN/creatinine was:  Lab Results  Component Value Date   BUN 21 10/18/2022   CREATININE 0.94 10/18/2022   Lab Results  Component Value Date   MICRALBCREAT 24.2 10/18/2022   MICRALBCREAT 12.0 09/10/2019   MICRALBCREAT 1.7 12/19/2017   MICRALBCREAT 1.4 08/07/2015   MICRALBCREAT 5.4 07/12/2014   MICRALBCREAT 1.0 09/11/2012   MICRALBCREAT 15.3 06/09/2012   MICRALBCREAT 1.4 11/23/2010   MICRALBCREAT 6.5 12/20/2008  On benazepril.  + HL; Last set of lipids: Lab Results  Component Value Date   CHOL 122 10/18/2022   HDL 60.50 10/18/2022   LDLCALC 36 10/18/2022   LDLDIRECT 64.0 04/21/2017   TRIG 127.0 10/18/2022   CHOLHDL 2 10/18/2022  On Vytorin and fenofibrate.  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 She has had cataract surgery.  She was admitted with blurry vision, papilledema in 02/26/2021.  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 She saw a retina specialist and a neurologist.  She was on steroids. She started IO injections.  - + numbness and tingling in her feet.  She sees podiatry-Dr. Stacie Acres.  Last foot exam 07/21/2023.  She had an ulcer in lower right leg (has lymphedema) >> healed. She also has a history of HTN, GERD, depression, h/o thrombophlebitis. Previously on B12 injections now on 1000 mcg p.o. B12 daily.  In 03/2022, she lost consciousness after a vomiting  episode.  She was down, on the floor, near her bed, the entire night, as she could not get up.  Since then, she ordered another alert bracelet and also an alert button for the shower.  She was not sure if her sugars were low at that time but the investigation was negative for culprits when paramedics came.  ROS: + see HPI  I reviewed pt's medications, allergies, PMH, social hx, family hx, and changes were documented in the history of present illness. Otherwise, unchanged from my initial visit note.  Past Medical History:  Diagnosis Date   Allergy     Anxiety    Arthritis    Cataract    Depression    Diabetes mellitus    GERD (gastroesophageal reflux disease)    Hyperlipidemia    Hypertension    Obesity    Past Surgical History:  Procedure Laterality Date   ABDOMINAL HYSTERECTOMY  11/01/1989   BSO   EYE SURGERY     IR ABLATE LIVER CRYOABLATION  12/31/2021   IR RADIOLOGIST EVAL & MGMT  12/11/2021   IR RADIOLOGIST EVAL & MGMT  01/07/2022   IR RADIOLOGIST EVAL & MGMT  02/15/2022   Social History   Socioeconomic History   Marital status: Widowed    Spouse name: Not on file   Number of children: 0   Years of education: 27   Highest education level: Some college, no degree  Occupational History   Occupation: retired from IT consultant: RETIRED  Tobacco Use   Smoking status: Former    Current packs/day: 0.00    Types: Cigarettes    Quit date: 1995    Years since quitting: 29.7    Passive exposure: Past   Smokeless tobacco: Never  Vaping Use   Vaping status: Never Used  Substance and Sexual Activity   Alcohol use: No   Drug use: No   Sexual activity: Not Currently    Partners: Male  Other Topics Concern   Not on file  Social History Narrative   Lives with husband   Right Handed   Drinks 3-5 cups caffeine daily   Social Determinants of Health   Financial Resource Strain: Low Risk  (06/13/2023)   Overall Financial Resource Strain (CARDIA)    Difficulty of Paying Living Expenses: Not hard at all  Food Insecurity: No Food Insecurity (06/13/2023)   Hunger Vital Sign    Worried About Running Out of Food in the Last Year: Never true    Ran Out of Food in the Last Year: Never true  Transportation Needs: No Transportation Needs (06/13/2023)   PRAPARE - Administrator, Civil Service (Medical): No    Lack of Transportation (Non-Medical): No  Physical Activity: Inactive (06/13/2023)   Exercise Vital Sign    Days of Exercise per Week: 0 days    Minutes of Exercise per Session: 0 min  Stress: Stress  Concern Present (06/13/2023)   [employer] of Occupational Health - Occupational Stress Questionnaire    Feeling of Stress : To some extent  Social Connections: Unknown (06/13/2023)   Social Connection and Isolation Panel [NHANES]    Frequency of Communication with Friends and Family: More than three times a week    Frequency of Social Gatherings with Friends and Family: Once a week    Attends Religious Services: Not on Insurance claims handler of Clubs or Organizations: No    Attends Banker Meetings: Never  Marital Status: Widowed  Intimate Partner Violence: Not At Risk (06/20/2023)   Humiliation, Afraid, Rape, and Kick questionnaire    Fear of Current or Ex-Partner: No    Emotionally Abused: No    Physically Abused: No    Sexually Abused: No   Current Outpatient Medications on File Prior to Visit  Medication Sig Dispense Refill   acetaminophen (TYLENOL) 650 MG CR tablet Take 650 mg by mouth 2 (two) times daily as needed for pain. For pain     aspirin 81 MG tablet Take 81 mg by mouth daily.     benazepril (LOTENSIN) 20 MG tablet Take 1 tablet (20 mg total) by mouth daily. 90 tablet 1   Blood Glucose Monitoring Suppl (ONE TOUCH ULTRA SYSTEM KIT) W/DEVICE KIT 1 kit by Does not apply route once. 1 each 0   COVID-19 mRNA vaccine 2023-2024 (COMIRNATY) syringe Inject into the muscle. 0.3 mL 0   estradiol (ESTRACE) 1 MG tablet TAKE 1 TABLET DAILY 90 tablet 1   ezetimibe-simvastatin (VYTORIN) 10-40 MG tablet Take 1 tablet by mouth at bedtime. Pt needs office visit for further refills 60 tablet 0   fenofibrate 160 MG tablet TAKE 1 TABLET DAILY 90 tablet 1   FLUoxetine (PROZAC) 20 MG tablet TAKE 3 TABLETS DAILY 270 tablet 1   glucose blood (ONETOUCH ULTRA) test strip Use to check blood sugar 3 times a day 100 each 0   insulin aspart (NOVOLOG FLEXPEN) 100 UNIT/ML FlexPen Inject 12-15 Units into the skin 3 (three) times daily with meals. 45 mL 3   insulin glargine (LANTUS) 100  unit/mL SOPN Inject 36 Units into the skin daily. 45 mL 0   Insulin Pen Needle (PEN NEEDLES) 32G X 4 MM MISC USE AS INSTRUCTED WITH LANCETS. 400 each 3   LANTUS SOLOSTAR 100 UNIT/ML Solostar Pen INJECT 36 UNITS            SUBCUTANEOUSLY DAILY 30 mL 0   meloxicam (MOBIC) 15 MG tablet Take 1 tablet (15 mg total) by mouth daily. 30 tablet 0   metFORMIN (GLUCOPHAGE) 850 MG tablet Take 1 tablet in the AM, and 2 tablets in the PM. 270 tablet 3   OZEMPIC, 2 MG/DOSE, 8 MG/3ML SOPN INJECT 2MG  SUBCUTANEOUSLY  ONCE WEEKLY (EVERY 7 DAYS) AS DIRECTED 9 mL 3   pantoprazole (PROTONIX) 40 MG tablet Take 1 tablet (40 mg total) by mouth daily. 90 tablet 3   RSV vaccine recomb adjuvanted (AREXVY) 120 MCG/0.5ML injection Inject into the muscle. 1 each 0   Current Facility-Administered Medications on File Prior to Visit  Medication Dose Route Frequency Provider Last Rate Last Admin   ipratropium-albuterol (DUONEB) 0.5-2.5 (3) MG/3ML nebulizer solution 3 mL  3 mL Nebulization Q6H Copland, Jessica C, MD   3 mL at 11/15/16 1330   Allergies  Allergen Reactions   Hydrocodone-Acetaminophen Other (See Comments)    unknown   Oxycodone Hcl Other (See Comments)    unknown   Penicillins Other (See Comments)    unknown   Prednisone     Patient has noted allergy to prednisone but she says it was a questionable history of mild nausea with prednisone and nothing more    Family History  Problem Relation Age of Onset   Macular degeneration Mother    Heart disease Mother        syncope   Aortic stenosis Mother    Lung disease Father 85       mesothelioma   Cancer Maternal Aunt  stomach   Heart disease Paternal Uncle        cabg   Diabetes Paternal Uncle    Heart disease Paternal Uncle    Diabetes Paternal Uncle    Diabetes Paternal Uncle    Heart disease Paternal Uncle    Heart disease Paternal Uncle    Heart disease Paternal Uncle    Lung cancer Other        asbestos   Diabetes Paternal Grandmother     Cancer Other        lung   PE: BP (!) 148/86 (BP Location: Right Arm, Patient Position: Sitting, Cuff Size: Large)   Pulse 76   Resp 18   Ht 5\' 3"  (1.6 m)   Wt 269 lb 9.6 oz (122.3 kg)   SpO2 95%   BMI 47.76 kg/m  Wt Readings from Last 3 Encounters:  08/04/23 269 lb 9.6 oz (122.3 kg)  12/22/22 258 lb (117 kg)  10/18/22 257 lb 9.6 oz (116.8 kg)   Constitutional: overweight, in NAD Eyes: EOMI, no exophthalmos ENT: no thyromegaly, no cervical lymphadenopathy Cardiovascular: RRR, No MRG, + B LE edema Respiratory: CTA B Musculoskeletal: no deformities Skin: no rashes except stasis dermatitis of bilateral lower legs Neurological: no tremor with outstretched hands.  ASSESSMENT: 1. DM2, insulin-dependent, uncontrolled, with complications - DR  2. Obesity class 3  3. HL  PLAN:  1. Patient with history of uncontrolled type 2 diabetes, insulin-dependent, with worse control in 2020 when she returned after an absence of 1.5 years.  At that time, she had a lot of stress and relaxed her diet and sugars were in the 200s-400s.  We discussed about improving diet, increased her insulin and GLP-1 receptor agonist doses.  Sugars improved afterwards and we were able to decrease her insulin doses including at last visit.  At that time, HbA1c was excellent, at 5.9%. CGM interpretation: -At today's visit, we reviewed her CGM downloads: It appears that 65% of values are in target range (goal >70%), while 33% are higher than 180 (goal <25%), and 2% are lower than 70 (goal <4%).  The calculated average blood sugar is 165.  The projected HbA1c for the next 3 months (GMI) is 7.3%. -Reviewing the CGM trends, sugars appear to be higher and more fluctuating in the last 2 weeks.  Upon questioning, however, she had thyroid injections in her knees 3 weeks ago, which could have influence her blood sugars.  Also, she did relax her diet over the summer and she also mentions that she forgets to take the NovoLog  injections before the meals and may take them after the meals.  This is probably the reason for the higher blood sugars after meals and the precipitous drops with occasional hypoglycemic values later after meals. -We discussed about continuing the same doses of insulin but to try her best to take NovoLog 15 minutes before the meals.  If she forgets, and her sugars are already high after the meal, I advised her to take only half of the recommended dose to avoid lows afterwards.  She agrees to try this.  Will continue metformin and Ozempic at the current doses. -I also advised her that around the time of her steroid injection to use the higher recommended NovoLog doses. - I advised her to:  Patient Instructions  Please continue: - Metformin 850 mg in am and 1700 mg at night - Ozempic 2 mg weekly - Basaglar 28 units at bedtime - NovoLog 15 min before meals: 10-15  units before b'fast and lunch 8-10 units before dinner  (if you inject after the meals, take only 50% of the dose) Around the time of steroids, take the higher NovoLog dose.  Please return in 4 months.  - we checked her HbA1c: 6.6% (higher) - advised to check sugars at different times of the day - 4x a day, rotating check times - advised for yearly eye exams >> she is UTD - return to clinic in 4 months  2. Obesity class 3  -Will continue Ozempic 2 mg weekly which should also help with weight loss -She lost 5 pounds before last visit but 11 since then  3. HL -Reviewed latest lipid panel from 10/2022: All fractions at goal: Lab Results  Component Value Date   CHOL 122 10/18/2022   HDL 60.50 10/18/2022   LDLCALC 36 10/18/2022   LDLDIRECT 64.0 04/21/2017   TRIG 127.0 10/18/2022   CHOLHDL 2 10/18/2022  -She is on simvastatin-ezetimibe 40-10 mg daily and fenofibrate 160 mg daily without side effects  Component     Latest Ref Rng 08/04/2023  Hemoglobin A1C     4.0 - 5.6 % 6.6 !   Sodium     135 - 145 mEq/L 140   Potassium      3.5 - 5.1 mEq/L 4.5   Chloride     96 - 112 mEq/L 103   CO2     19 - 32 mEq/L 30   Glucose     70 - 99 mg/dL 191 (H)   BUN     6 - 23 mg/dL 21   Creatinine     4.78 - 1.20 mg/dL 2.95   Total Bilirubin     0.2 - 1.2 mg/dL 0.5   Alkaline Phosphatase     39 - 117 U/L 26 (L)   AST     0 - 37 U/L 16   ALT     0 - 35 U/L 11   Total Protein     6.0 - 8.3 g/dL 5.8 (L)   Albumin     3.5 - 5.2 g/dL 3.8   Calcium     8.4 - 10.5 mg/dL 9.4   GFR     >62.13 mL/min 56.30 (L)   Cholesterol     0 - 200 mg/dL 086   Triglycerides     0.0 - 149.0 mg/dL 578.4   HDL Cholesterol     >39.00 mg/dL 69.62   VLDL     0.0 - 40.0 mg/dL 95.2   LDL (calc)     0 - 99 mg/dL 31   Total CHOL/HDL Ratio 2   NonHDL 56.86   Microalb, Ur     0.0 - 1.9 mg/dL 84.1 (H)   Creatinine,U     mg/dL 324.4   MICROALB/CREAT RATIO     0.0 - 30.0 mg/g 12.3   Labs are at goal with the exception of the slightly low kidney function and alkaline phosphatase which is still low.    Carlus Pavlov, MD PhD [facility]

## 2023-08-04 NOTE — Patient Instructions (Addendum)
Please continue: - Metformin 850 mg in am and 1700 mg at night - Ozempic 2 mg weekly - Basaglar 28 units at bedtime - NovoLog 15 min before meals: 10-15 units before b'fast and lunch 8-10 units before dinner  (if you inject after the meals, take only 50% of the dose) Around the time of steroids, take the higher NovoLog dose.   Please return in 4 months.

## 2023-08-04 NOTE — Addendum Note (Signed)
Addended by: Tera Partridge on: 08/04/2023 11:28 AM   Modules accepted: Orders

## 2023-09-16 ENCOUNTER — Other Ambulatory Visit: Payer: Self-pay | Admitting: Family Medicine

## 2023-09-16 DIAGNOSIS — K219 Gastro-esophageal reflux disease without esophagitis: Secondary | ICD-10-CM

## 2023-09-21 ENCOUNTER — Encounter: Payer: Self-pay | Admitting: Internal Medicine

## 2023-09-22 ENCOUNTER — Other Ambulatory Visit: Payer: Self-pay | Admitting: Family Medicine

## 2023-09-22 ENCOUNTER — Encounter: Payer: Self-pay | Admitting: Family Medicine

## 2023-09-22 ENCOUNTER — Encounter: Payer: Self-pay | Admitting: Podiatry

## 2023-09-22 ENCOUNTER — Ambulatory Visit (INDEPENDENT_AMBULATORY_CARE_PROVIDER_SITE_OTHER): Payer: Medicare Other | Admitting: Podiatry

## 2023-09-22 VITALS — Ht 63.0 in | Wt 269.6 lb

## 2023-09-22 DIAGNOSIS — Z780 Asymptomatic menopausal state: Secondary | ICD-10-CM

## 2023-09-22 DIAGNOSIS — M79676 Pain in unspecified toe(s): Secondary | ICD-10-CM

## 2023-09-22 DIAGNOSIS — E1151 Type 2 diabetes mellitus with diabetic peripheral angiopathy without gangrene: Secondary | ICD-10-CM

## 2023-09-22 DIAGNOSIS — L84 Corns and callosities: Secondary | ICD-10-CM

## 2023-09-22 DIAGNOSIS — F418 Other specified anxiety disorders: Secondary | ICD-10-CM

## 2023-09-22 DIAGNOSIS — Z794 Long term (current) use of insulin: Secondary | ICD-10-CM | POA: Diagnosis not present

## 2023-09-22 DIAGNOSIS — B351 Tinea unguium: Secondary | ICD-10-CM | POA: Diagnosis not present

## 2023-09-22 NOTE — Progress Notes (Signed)
This patient returns to my office for at risk foot care.  This patient requires this care by a professional since this patient will be at risk due to having diabetes and history of thrombophlebitis.  This patient says her callus on her big toes is painful walking and wearing her shoes.  She is unable to self treat.  This patient presents for at risk foot care today.  General Appearance  Alert, conversant and in no acute stress.  Vascular  Dorsalis pedis and posterior tibial  pulses are not  palpable  Bilaterally  Due to swelling..  Capillary return is within normal limits  bilaterally. Temperature is within normal limits  bilaterally.  Neurologic  Senn-Weinstein monofilament wire test diminished  bilaterally. Muscle power within normal limits bilaterally.  Nails Thick disfigured discolored nails with subungual debris  from hallux to fifth toes bilaterally. No evidence of bacterial infection or drainage bilaterally.  Orthopedic  No limitations of motion  feet .  No crepitus or effusions noted.  No bony pathology or digital deformities noted.  Skin  normotropic skin with no porokeratosis noted bilaterally.  No signs of infections or ulcers noted.   Pinch callus  B/L.   No infection or drainage noted.  Onychomycosis  Pain in right toes  Pain in left toes  Pinch callus  B/l.  Consent was obtained for treatment procedures.   Mechanical debridement of nails 1-5  bilaterally performed with a nail nipper.  Filed with dremel without incident.  Debride callus with dremel tool.   Return office visit   9  weeks                  Told patient to return for periodic foot care and evaluation due to potential at risk complications.   Helane Gunther DPM

## 2023-09-23 MED ORDER — FLUOXETINE HCL 20 MG PO TABS
60.0000 mg | ORAL_TABLET | Freq: Every day | ORAL | 1 refills | Status: DC
Start: 2023-09-23 — End: 2023-10-18

## 2023-09-23 MED ORDER — ESTRADIOL 1 MG PO TABS: 1.0000 mg | ORAL_TABLET | Freq: Every day | ORAL | 1 refills | Status: DC

## 2023-10-04 ENCOUNTER — Encounter: Payer: Self-pay | Admitting: Family Medicine

## 2023-10-13 ENCOUNTER — Other Ambulatory Visit: Payer: Self-pay | Admitting: Family Medicine

## 2023-10-13 DIAGNOSIS — E785 Hyperlipidemia, unspecified: Secondary | ICD-10-CM

## 2023-10-14 ENCOUNTER — Other Ambulatory Visit: Payer: Self-pay | Admitting: Family Medicine

## 2023-10-14 ENCOUNTER — Encounter: Payer: Self-pay | Admitting: Family Medicine

## 2023-10-14 DIAGNOSIS — E785 Hyperlipidemia, unspecified: Secondary | ICD-10-CM

## 2023-10-14 DIAGNOSIS — K219 Gastro-esophageal reflux disease without esophagitis: Secondary | ICD-10-CM

## 2023-10-14 MED ORDER — EZETIMIBE-SIMVASTATIN 10-40 MG PO TABS: 1.0000 | ORAL_TABLET | Freq: Every day | ORAL | 0 refills | Status: DC

## 2023-10-18 ENCOUNTER — Ambulatory Visit (INDEPENDENT_AMBULATORY_CARE_PROVIDER_SITE_OTHER): Payer: Medicare Other | Admitting: Family Medicine

## 2023-10-18 ENCOUNTER — Encounter: Payer: Self-pay | Admitting: Family Medicine

## 2023-10-18 VITALS — BP 142/88 | HR 83 | Temp 98.7°F | Resp 20 | Ht 63.0 in | Wt 270.6 lb

## 2023-10-18 DIAGNOSIS — I10 Essential (primary) hypertension: Secondary | ICD-10-CM | POA: Diagnosis not present

## 2023-10-18 DIAGNOSIS — K219 Gastro-esophageal reflux disease without esophagitis: Secondary | ICD-10-CM

## 2023-10-18 DIAGNOSIS — E785 Hyperlipidemia, unspecified: Secondary | ICD-10-CM

## 2023-10-18 DIAGNOSIS — E11649 Type 2 diabetes mellitus with hypoglycemia without coma: Secondary | ICD-10-CM

## 2023-10-18 DIAGNOSIS — Z794 Long term (current) use of insulin: Secondary | ICD-10-CM | POA: Diagnosis not present

## 2023-10-18 DIAGNOSIS — E1169 Type 2 diabetes mellitus with other specified complication: Secondary | ICD-10-CM

## 2023-10-18 DIAGNOSIS — F418 Other specified anxiety disorders: Secondary | ICD-10-CM | POA: Diagnosis not present

## 2023-10-18 MED ORDER — FENOFIBRATE 160 MG PO TABS: ORAL_TABLET | ORAL | 1 refills | Status: DC

## 2023-10-18 MED ORDER — BENAZEPRIL HCL 20 MG PO TABS: 20.0000 mg | ORAL_TABLET | Freq: Every day | ORAL | 1 refills | Status: DC

## 2023-10-18 MED ORDER — FLUOXETINE HCL 20 MG PO TABS: 60.0000 mg | ORAL_TABLET | Freq: Every day | ORAL | 1 refills | Status: DC

## 2023-10-18 MED ORDER — EZETIMIBE-SIMVASTATIN 10-40 MG PO TABS: 1.0000 | ORAL_TABLET | Freq: Every day | ORAL | 1 refills | Status: DC

## 2023-10-18 NOTE — Assessment & Plan Note (Signed)
Well controlled, no changes to meds. Encouraged heart healthy diet such as the DASH diet and exercise as tolerated.  °

## 2023-10-18 NOTE — Progress Notes (Signed)
Established Patient Office Visit  Subjective   Patient ID: Andrea Marks, female    DOB: [birth year]  Age: 72 y.o. MRN: [id number]  Chief Complaint  Patient presents with   Diabetes   Hypertension   Hyperlipidemia   Follow-up    HPI Discussed the use of AI scribe software for clinical note transcription with the patient, who gave verbal consent to proceed.  History of Present Illness   The patient, with a history of diabetes, presents with fluctuating blood glucose levels following recent cortisone injections in the knees. She reports using a Libre monitor for glucose tracking, but expresses dissatisfaction with the device's battery life, necessitating daily charging. She plans to discuss this issue with her healthcare provider in February.  The patient also reports needing a refill for fluoxetine, which she was previously unable to obtain. She was also asked to provide a urine specimen to check for protein, but was unable to do so during the visit.  The patient also mentions a recent minor foot injury caused by a dropped can, resulting in some swelling. She has a history of similar incidents, including one that resulted in a lacerated toe.  In addition to her physical health, the patient indicates some emotional distress related to the holiday season, expressing a desire to spend the time alone. She plans to engage in traditional [place] cooking as a form of solace.      Patient Active Problem List   Diagnosis Date Noted   Depression with anxiety 10/01/2021   Insomnia 10/01/2021   Vision loss of right eye    Nonintractable headache    Papilledema 02/26/2021   Degenerative arthritis of knee, bilateral 10/30/2020   Degenerative arthritis of right knee 12/25/2018   Fatigue 12/15/2018   Hyperlipidemia associated with type 2 diabetes mellitus (HCC) 12/15/2018   B12 deficiency 12/15/2018   AC (acromioclavicular) joint arthritis 06/20/2018   Left rotator cuff tear  05/29/2018   Hyperlipidemia LDL goal <70 12/19/2017   Morbid obesity (HCC) 11/15/2016   Type 2 diabetes mellitus with hyperglycemia, with long-term current use of insulin (HCC) 10/23/2015   Post-traumatic wound infection 04/17/2013   Thrombophlebitis leg 02/23/2011   TINEA CORPORIS 12/30/2008   Depression, major, single episode, moderate (HCC) 12/14/2006   Essential hypertension 12/14/2006   GERD 12/14/2006   Past Medical History:  Diagnosis Date   Allergy    Anxiety    Arthritis    Cataract    Depression    Diabetes mellitus    GERD (gastroesophageal reflux disease)    Hyperlipidemia    Hypertension    Obesity    Past Surgical History:  Procedure Laterality Date   ABDOMINAL HYSTERECTOMY  11/01/1989   BSO   EYE SURGERY     IR ABLATE LIVER CRYOABLATION  12/31/2021   IR RADIOLOGIST EVAL & MGMT  12/11/2021   IR RADIOLOGIST EVAL & MGMT  01/07/2022   IR RADIOLOGIST EVAL & MGMT  02/15/2022   Social History   Tobacco Use   Smoking status: Former    Current packs/day: 0.00    Types: Cigarettes    Quit date: 1995    Years since quitting: 29.9    Passive exposure: Past   Smokeless tobacco: Never  Vaping Use   Vaping status: Never Used  Substance Use Topics   Alcohol use: No   Drug use: No   Social History   Socioeconomic History   Marital status: Widowed    Spouse name: Not on file  Number of children: 0   Years of education: 13   Highest education level: 12th grade  Occupational History   Occupation: retired from IT consultant: RETIRED  Tobacco Use   Smoking status: Former    Current packs/day: 0.00    Types: Cigarettes    Quit date: 1995    Years since quitting: 29.9    Passive exposure: Past   Smokeless tobacco: Never  Vaping Use   Vaping status: Never Used  Substance and Sexual Activity   Alcohol use: No   Drug use: No   Sexual activity: Not Currently    Partners: Male  Other Topics Concern   Not on file  Social History Narrative    Lives with husband   Right Handed   Drinks 3-5 cups caffeine daily   Social Drivers of Health   Financial Resource Strain: Low Risk  (10/17/2023)   Overall Financial Resource Strain (CARDIA)    Difficulty of Paying Living Expenses: Not hard at all  Food Insecurity: No Food Insecurity (10/17/2023)   Hunger Vital Sign    Worried About Running Out of Food in the Last Year: Never true    Ran Out of Food in the Last Year: Never true  Transportation Needs: No Transportation Needs (10/17/2023)   PRAPARE - Administrator, Civil Service (Medical): No    Lack of Transportation (Non-Medical): No  Physical Activity: Inactive (10/17/2023)   Exercise Vital Sign    Days of Exercise per Week: 0 days    Minutes of Exercise per Session: 0 min  Stress: Stress Concern Present (10/17/2023)   [employer] of Occupational Health - Occupational Stress Questionnaire    Feeling of Stress : To some extent  Social Connections: Unknown (10/17/2023)   Social Connection and Isolation Panel [NHANES]    Frequency of Communication with Friends and Family: More than three times a week    Frequency of Social Gatherings with Friends and Family: Patient declined    Attends Religious Services: Patient declined    Database administrator or Organizations: No    Attends Banker Meetings: Never    Marital Status: Widowed  Intimate Partner Violence: Not At Risk (06/20/2023)   Humiliation, Afraid, Rape, and Kick questionnaire    Fear of Current or Ex-Partner: No    Emotionally Abused: No    Physically Abused: No    Sexually Abused: No   Family Status  Relation Name Status   Mother  Deceased at age 74   Father  Deceased   Mat Aunt  Deceased   Pat Uncle burney Alive   Pat Uncle sally Deceased   Pat Uncle jerry Deceased   Pat Uncle  Deceased   Pat Uncle  Deceased   Mat Aunt  Alive   Other  (Not Specified)   PGM  (Not Specified)   Other  (Not Specified)  No partnership data on file    Family History  Problem Relation Age of Onset   Macular degeneration Mother    Heart disease Mother        syncope   Aortic stenosis Mother    Lung disease Father 33       mesothelioma   Cancer Maternal Aunt        stomach   Heart disease Paternal Uncle        cabg   Diabetes Paternal Uncle    Heart disease Paternal Uncle    Diabetes Paternal Uncle  Diabetes Paternal Uncle    Heart disease Paternal Uncle    Heart disease Paternal Uncle    Heart disease Paternal Uncle    Lung cancer Other        asbestos   Diabetes Paternal Grandmother    Cancer Other        lung   Allergies  Allergen Reactions   Hydrocodone-Acetaminophen Other (See Comments)    unknown   Oxycodone Hcl Other (See Comments)    unknown   Penicillins Other (See Comments)    unknown   Prednisone     Patient has noted allergy to prednisone but she says it was a questionable history of mild nausea with prednisone and nothing more       Review of Systems  Constitutional:  Negative for chills, fever and malaise/fatigue.  HENT:  Negative for congestion and hearing loss.   Eyes:  Negative for blurred vision and discharge.  Respiratory:  Negative for cough, sputum production and shortness of breath.   Cardiovascular:  Negative for chest pain, palpitations and leg swelling.  Gastrointestinal:  Negative for abdominal pain, blood in stool, constipation, diarrhea, heartburn, nausea and vomiting.  Genitourinary:  Negative for dysuria, frequency, hematuria and urgency.  Musculoskeletal:  Negative for back pain, falls and myalgias.  Skin:  Negative for rash.  Neurological:  Negative for dizziness, sensory change, loss of consciousness, weakness and headaches.  Endo/Heme/Allergies:  Negative for environmental allergies. Does not bruise/bleed easily.  Psychiatric/Behavioral:  Negative for depression and suicidal ideas. The patient is not nervous/anxious and does not have insomnia.       Objective:     BP  (!) 142/88 (BP Location: Right Arm, Patient Position: Sitting)   Pulse 83   Temp 98.7 F (37.1 C) (Oral)   Resp 20   Ht 5\' 3"  (1.6 m)   Wt 270 lb 9.6 oz (122.7 kg)   SpO2 95%   BMI 47.93 kg/m  BP Readings from Last 3 Encounters:  10/18/23 (!) 142/88  08/04/23 (!) 148/86  12/22/22 128/82   Wt Readings from Last 3 Encounters:  10/18/23 270 lb 9.6 oz (122.7 kg)  09/22/23 269 lb 9.6 oz (122.3 kg)  08/04/23 269 lb 9.6 oz (122.3 kg)   SpO2 Readings from Last 3 Encounters:  10/18/23 95%  08/04/23 95%  12/22/22 96%      Physical Exam Vitals and nursing note reviewed.  Constitutional:      General: She is not in acute distress.    Appearance: Normal appearance. She is well-developed.  HENT:     Head: Normocephalic and atraumatic.  Eyes:     General: No scleral icterus.       Right eye: No discharge.        Left eye: No discharge.  Cardiovascular:     Rate and Rhythm: Normal rate and regular rhythm.     Heart sounds: No murmur heard. Pulmonary:     Effort: Pulmonary effort is normal. No respiratory distress.     Breath sounds: Normal breath sounds.  Musculoskeletal:        General: Normal range of motion.     Cervical back: Normal range of motion and neck supple.     Right lower leg: No edema.     Left lower leg: No edema.  Skin:    General: Skin is warm and dry.  Neurological:     Mental Status: She is alert and oriented to person, place, and time.  Psychiatric:  Mood and Affect: Mood normal.        Behavior: Behavior normal.        Thought Content: Thought content normal.        Judgment: Judgment normal.      No results found for any visits on 10/18/23.  Last CBC Lab Results  Component Value Date   WBC 10.4 10/18/2022   HGB 14.6 10/18/2022   HCT 44.7 10/18/2022   MCV 82.8 10/18/2022   MCH 27.1 03/04/2021   RDW 16.3 (H) 10/18/2022   PLT 215.0 10/18/2022   Last metabolic panel Lab Results  Component Value Date   GLUCOSE 119 (H) 08/04/2023    NA 140 08/04/2023   K 4.5 08/04/2023   CL 103 08/04/2023   CO2 30 08/04/2023   BUN 21 08/04/2023   CREATININE 1.00 08/04/2023   GFR 56.30 (L) 08/04/2023   CALCIUM 9.4 08/04/2023   PROT 5.8 (L) 08/04/2023   ALBUMIN 3.8 08/04/2023   BILITOT 0.5 08/04/2023   ALKPHOS 26 (L) 08/04/2023   AST 16 08/04/2023   ALT 11 08/04/2023   ANIONGAP 8 03/04/2021   Last lipids Lab Results  Component Value Date   CHOL 114 08/04/2023   HDL 57.00 08/04/2023   LDLCALC 31 08/04/2023   LDLDIRECT 64.0 04/21/2017   TRIG 127.0 08/04/2023   CHOLHDL 2 08/04/2023   Last hemoglobin A1c Lab Results  Component Value Date   HGBA1C 6.6 (A) 08/04/2023   Last thyroid functions Lab Results  Component Value Date   TSH 3.22 08/07/2015   Last vitamin D No results found for: "25OHVITD2", "25OHVITD3", "VD25OH" Last vitamin B12 and Folate Lab Results  Component Value Date   VITAMINB12 136 (L) 12/15/2018      The ASCVD Risk score (Arnett DK, et al., 2019) failed to calculate for the following reasons:   The valid total cholesterol range is 130 to 320 mg/dL    Assessment & Plan:   Problem List Items Addressed This Visit       Unprioritized   GERD   Hyperlipidemia LDL goal <70 - Primary   Relevant Medications   benazepril (LOTENSIN) 20 MG tablet   ezetimibe-simvastatin (VYTORIN) 10-40 MG tablet   fenofibrate 160 MG tablet   Other Relevant Orders   Lipid panel   Comprehensive metabolic panel   Depression with anxiety   Relevant Medications   FLUoxetine (PROZAC) 20 MG tablet   Hyperlipidemia associated with type 2 diabetes mellitus (HCC)   hgba1c to check , minimize simple carbs. Increase exercise as tolerated. Continue current meds       Relevant Medications   benazepril (LOTENSIN) 20 MG tablet   ezetimibe-simvastatin (VYTORIN) 10-40 MG tablet   fenofibrate 160 MG tablet   Essential hypertension (Chronic)   Well controlled, no changes to meds. Encouraged heart healthy diet such as the DASH  diet and exercise as tolerated.        Relevant Medications   benazepril (LOTENSIN) 20 MG tablet   ezetimibe-simvastatin (VYTORIN) 10-40 MG tablet   fenofibrate 160 MG tablet   Other Relevant Orders   Lipid panel   CBC with Differential/Platelet   Comprehensive metabolic panel   Other Visit Diagnoses       Type 2 diabetes mellitus with hypoglycemia without coma, with long-term current use of insulin (HCC)       Relevant Medications   benazepril (LOTENSIN) 20 MG tablet   Other Relevant Orders   Comprehensive metabolic panel   Hemoglobin A1c   Microalbumin /  creatinine urine ratio     Assessment and Plan    Diabetes Mellitus   Her diabetes is poorly controlled with fluctuating blood glucose levels, attributed to recent cortisone injections. She uses a CGM but faces daily charging issues and lacks a smartphone for app monitoring. She plans to discuss these CGM issues with her endocrinologist in February and will closely monitor her blood glucose levels.  Medication Refill   She requires a refill of fluoxetine and possibly another medication, as previous attempts to refill were unsuccessful. We will refill the fluoxetine prescription and verify and refill any other necessary medications.  Foot Swelling   She has swelling in her foot due to trauma from dropping a can, but no immediate intervention is required. We will monitor the foot for any changes or worsening of symptoms.  Post-Surgical Follow-Up   She recently underwent surgery for trigger finger and cyst removal with no visible complications. We will monitor the surgical site for signs of infection or complications.  Urinary Protein Check   A urine specimen is needed to check for protein, but she was unable to provide a sample during the visit. We will collect a urine specimen at the next visit.        Return in about 6 months (around 04/17/2024) for annual exam, fasting.    Donato Schultz, DO

## 2023-10-18 NOTE — Assessment & Plan Note (Signed)
hgba1c to check , minimize simple carbs. Increase exercise as tolerated. Continue current meds

## 2023-10-18 NOTE — Patient Instructions (Signed)
Cholesterol Content in Foods Cholesterol is a waxy, fat-like substance that helps to carry fat in the blood. The body needs cholesterol in small amounts, but too much cholesterol can cause damage to the arteries and heart. What foods have cholesterol?  Cholesterol is found in animal-based foods, such as meat, seafood, and dairy. Generally, low-fat dairy and lean meats have less cholesterol than full-fat dairy and fatty meats. The milligrams of cholesterol per serving (mg per serving) of common cholesterol-containing foods are listed below. Meats and other proteins Egg -- one large whole egg has 186 mg. Veal shank -- 4 oz (113 g) has 141 mg. Lean ground turkey (93% lean) -- 4 oz (113 g) has 118 mg. Fat-trimmed lamb loin -- 4 oz (113 g) has 106 mg. Lean ground beef (90% lean) -- 4 oz (113 g) has 100 mg. Lobster -- 3.5 oz (99 g) has 90 mg. Pork loin chops -- 4 oz (113 g) has 86 mg. Canned salmon -- 3.5 oz (99 g) has 83 mg. Fat-trimmed beef top loin -- 4 oz (113 g) has 78 mg. Frankfurter -- 1 frank (3.5 oz or 99 g) has 77 mg. Crab -- 3.5 oz (99 g) has 71 mg. Roasted chicken without skin, white meat -- 4 oz (113 g) has 66 mg. Light bologna -- 2 oz (57 g) has 45 mg. Deli-cut turkey -- 2 oz (57 g) has 31 mg. Canned tuna -- 3.5 oz (99 g) has 31 mg. Bacon -- 1 oz (28 g) has 29 mg. Oysters and mussels (raw) -- 3.5 oz (99 g) has 25 mg. Mackerel -- 1 oz (28 g) has 22 mg. Trout -- 1 oz (28 g) has 20 mg. Pork sausage -- 1 link (1 oz or 28 g) has 17 mg. Salmon -- 1 oz (28 g) has 16 mg. Tilapia -- 1 oz (28 g) has 14 mg. Dairy Soft-serve ice cream --  cup (4 oz or 86 g) has 103 mg. Whole-milk yogurt -- 1 cup (8 oz or 245 g) has 29 mg. Cheddar cheese -- 1 oz (28 g) has 28 mg. American cheese -- 1 oz (28 g) has 28 mg. Whole milk -- 1 cup (8 oz or 250 mL) has 23 mg. 2% milk -- 1 cup (8 oz or 250 mL) has 18 mg. Cream cheese -- 1 tablespoon (Tbsp) (14.5 g) has 15 mg. Cottage cheese --  cup (4 oz or  113 g) has 14 mg. Low-fat (1%) milk -- 1 cup (8 oz or 250 mL) has 10 mg. Sour cream -- 1 Tbsp (12 g) has 8.5 mg. Low-fat yogurt -- 1 cup (8 oz or 245 g) has 8 mg. Nonfat Greek yogurt -- 1 cup (8 oz or 228 g) has 7 mg. Half-and-half cream -- 1 Tbsp (15 mL) has 5 mg. Fats and oils Cod liver oil -- 1 tablespoon (Tbsp) (13.6 g) has 82 mg. Butter -- 1 Tbsp (14 g) has 15 mg. Lard -- 1 Tbsp (12.8 g) has 14 mg. Bacon grease -- 1 Tbsp (12.9 g) has 14 mg. Mayonnaise -- 1 Tbsp (13.8 g) has 5-10 mg. Margarine -- 1 Tbsp (14 g) has 3-10 mg. The items listed above may not be a complete list of foods with cholesterol. Exact amounts of cholesterol in these foods may vary depending on specific ingredients and brands. Contact a dietitian for more information. What foods do not have cholesterol? Most plant-based foods do not have cholesterol unless you combine them with a food that has   cholesterol. Foods without cholesterol include: Grains and cereals. Vegetables. Fruits. Vegetable oils, such as olive, canola, and sunflower oil. Legumes, such as peas, beans, and lentils. Nuts and seeds. Egg whites. The items listed above may not be a complete list of foods that do not have cholesterol. Contact a dietitian for more information. Summary The body needs cholesterol in small amounts, but too much cholesterol can cause damage to the arteries and heart. Cholesterol is found in animal-based foods, such as meat, seafood, and dairy. Generally, low-fat dairy and lean meats have less cholesterol than full-fat dairy and fatty meats. This information is not intended to replace advice given to you by your health care provider. Make sure you discuss any questions you have with your health care provider. Document Revised: 02/27/2021 Document Reviewed: 02/27/2021 Elsevier Patient Education  2024 Elsevier Inc.  

## 2023-10-19 LAB — LIPID PANEL
Cholesterol: 130 mg/dL (ref 0–200)
HDL: 58.1 mg/dL (ref >39.00)
LDL Cholesterol: 48 mg/dL (ref 0–99)
NonHDL: 72.04
Total CHOL/HDL Ratio: 2
Triglycerides: 120 mg/dL (ref 0.0–149.0)
VLDL: 24 mg/dL (ref 0.0–40.0)

## 2023-10-19 LAB — CBC WITH DIFFERENTIAL/PLATELET
Basophils Absolute: 0.1 10*3/uL (ref 0.0–0.1)
Basophils Relative: 1.1 % (ref 0.0–3.0)
Eosinophils Absolute: 0.1 10*3/uL (ref 0.0–0.7)
Eosinophils Relative: 1.3 % (ref 0.0–5.0)
HCT: 43 % (ref 36.0–46.0)
Hemoglobin: 13.9 g/dL (ref 12.0–15.0)
Lymphocytes Relative: 23.6 % (ref 12.0–46.0)
Lymphs Abs: 2.6 10*3/uL (ref 0.7–4.0)
MCHC: 32.2 g/dL (ref 30.0–36.0)
MCV: 86 fL (ref 78.0–100.0)
Monocytes Absolute: 0.9 10*3/uL (ref 0.1–1.0)
Monocytes Relative: 7.9 % (ref 3.0–12.0)
Neutro Abs: 7.2 10*3/uL (ref 1.4–7.7)
Neutrophils Relative %: 66.1 % (ref 43.0–77.0)
Platelets: 222 10*3/uL (ref 150.0–400.0)
RBC: 5 Mil/uL (ref 3.87–5.11)
RDW: 15.5 % (ref 11.5–15.5)
WBC: 10.9 10*3/uL — ABNORMAL HIGH (ref 4.0–10.5)

## 2023-10-19 LAB — COMPREHENSIVE METABOLIC PANEL
ALT: 15 U/L (ref 0–35)
AST: 19 U/L (ref 0–37)
Albumin: 3.7 g/dL (ref 3.5–5.2)
Alkaline Phosphatase: 26 U/L — ABNORMAL LOW (ref 39–117)
BUN: 23 mg/dL (ref 6–23)
CO2: 28 meq/L (ref 19–32)
Calcium: 9 mg/dL (ref 8.4–10.5)
Chloride: 106 meq/L (ref 96–112)
Creatinine, Ser: 0.94 mg/dL (ref 0.40–1.20)
GFR: 60.56 mL/min (ref >60.00)
Glucose, Bld: 96 mg/dL (ref 70–99)
Potassium: 4.3 meq/L (ref 3.5–5.1)
Sodium: 143 meq/L (ref 135–145)
Total Bilirubin: 0.4 mg/dL (ref 0.2–1.2)
Total Protein: 5.7 g/dL — ABNORMAL LOW (ref 6.0–8.3)

## 2023-10-19 LAB — HEMOGLOBIN A1C: Hgb A1c MFr Bld: 8.3 % — ABNORMAL HIGH (ref 4.6–6.5)

## 2023-11-01 ENCOUNTER — Other Ambulatory Visit: Payer: Self-pay | Admitting: Internal Medicine

## 2023-11-01 DIAGNOSIS — E11319 Type 2 diabetes mellitus with unspecified diabetic retinopathy without macular edema: Secondary | ICD-10-CM

## 2023-11-01 LAB — HM DIABETES EYE EXAM

## 2023-11-04 ENCOUNTER — Encounter: Payer: Self-pay | Admitting: Internal Medicine

## 2023-11-04 DIAGNOSIS — E11319 Type 2 diabetes mellitus with unspecified diabetic retinopathy without macular edema: Secondary | ICD-10-CM

## 2023-11-04 MED ORDER — NOVOLOG FLEXPEN 100 UNIT/ML ~~LOC~~ SOPN: 12.0000 [IU] | PEN_INJECTOR | Freq: Three times a day (TID) | SUBCUTANEOUS | 3 refills | Status: DC

## 2023-11-10 ENCOUNTER — Encounter: Payer: Self-pay | Admitting: Family Medicine

## 2023-11-12 ENCOUNTER — Other Ambulatory Visit: Payer: Self-pay | Admitting: Family Medicine

## 2023-11-12 DIAGNOSIS — E785 Hyperlipidemia, unspecified: Secondary | ICD-10-CM

## 2023-11-15 ENCOUNTER — Other Ambulatory Visit: Payer: Self-pay | Admitting: Family Medicine

## 2023-11-15 DIAGNOSIS — K219 Gastro-esophageal reflux disease without esophagitis: Secondary | ICD-10-CM

## 2023-11-17 ENCOUNTER — Other Ambulatory Visit: Payer: Self-pay | Admitting: Internal Medicine

## 2023-11-17 DIAGNOSIS — E11319 Type 2 diabetes mellitus with unspecified diabetic retinopathy without macular edema: Secondary | ICD-10-CM

## 2023-11-18 MED ORDER — LANTUS SOLOSTAR 100 UNIT/ML ~~LOC~~ SOPN: 28.0000 [IU] | PEN_INJECTOR | Freq: Every day | SUBCUTANEOUS | Status: DC

## 2023-12-06 ENCOUNTER — Ambulatory Visit: Payer: Medicare Other | Admitting: Internal Medicine

## 2023-12-09 ENCOUNTER — Ambulatory Visit: Payer: Medicare Other | Admitting: Internal Medicine

## 2023-12-15 ENCOUNTER — Ambulatory Visit: Payer: Medicare Other | Admitting: Internal Medicine

## 2023-12-23 ENCOUNTER — Ambulatory Visit: Payer: Medicare Other | Admitting: Podiatry

## 2023-12-23 ENCOUNTER — Encounter: Payer: Self-pay | Admitting: Podiatry

## 2023-12-23 VITALS — Ht 63.0 in | Wt 270.0 lb

## 2023-12-23 DIAGNOSIS — E1151 Type 2 diabetes mellitus with diabetic peripheral angiopathy without gangrene: Secondary | ICD-10-CM

## 2023-12-23 DIAGNOSIS — B351 Tinea unguium: Secondary | ICD-10-CM

## 2023-12-23 DIAGNOSIS — L84 Corns and callosities: Secondary | ICD-10-CM

## 2023-12-23 DIAGNOSIS — Z794 Long term (current) use of insulin: Secondary | ICD-10-CM

## 2023-12-23 DIAGNOSIS — M79676 Pain in unspecified toe(s): Secondary | ICD-10-CM

## 2023-12-23 NOTE — Progress Notes (Signed)
This patient returns to my office for at risk foot care.  This patient requires this care by a professional since this patient will be at risk due to having diabetes and history of thrombophlebitis.  This patient says her callus on her big toes is painful walking and wearing her shoes.  She is unable to self treat.  This patient presents for at risk foot care today.  General Appearance  Alert, conversant and in no acute stress.  Vascular  Dorsalis pedis and posterior tibial  pulses are not  palpable  Bilaterally  Due to swelling..  Capillary return is within normal limits  bilaterally. Temperature is within normal limits  bilaterally.  Neurologic  Senn-Weinstein monofilament wire test diminished  bilaterally. Muscle power within normal limits bilaterally.  Nails Thick disfigured discolored nails with subungual debris  from hallux to fifth toes bilaterally. No evidence of bacterial infection or drainage bilaterally.  Orthopedic  No limitations of motion  feet .  No crepitus or effusions noted.  No bony pathology or digital deformities noted.  Skin  normotropic skin with no porokeratosis noted bilaterally.  No signs of infections or ulcers noted.   Pinch callus  B/L.   No infection or drainage noted.  Onychomycosis  Pain in right toes  Pain in left toes  Pinch callus  B/l.  Consent was obtained for treatment procedures.   Mechanical debridement of nails 1-5  bilaterally performed with a nail nipper.  Filed with dremel without incident.  Debride callus with dremel tool.   Return office visit   9  weeks                  Told patient to return for periodic foot care and evaluation due to potential at risk complications.   Helane Gunther DPM

## 2024-01-08 ENCOUNTER — Emergency Department (HOSPITAL_COMMUNITY)

## 2024-01-08 ENCOUNTER — Other Ambulatory Visit: Payer: Self-pay

## 2024-01-08 ENCOUNTER — Inpatient Hospital Stay (HOSPITAL_COMMUNITY)
Admission: EM | Admit: 2024-01-08 | Discharge: 2024-01-12 | DRG: 309 | Disposition: A | Discharge status: Home Health | Attending: Family Medicine | Admitting: Family Medicine

## 2024-01-08 DIAGNOSIS — Z66 Do not resuscitate: Secondary | ICD-10-CM | POA: Diagnosis present

## 2024-01-08 DIAGNOSIS — Z7984 Long term (current) use of oral hypoglycemic drugs: Secondary | ICD-10-CM

## 2024-01-08 DIAGNOSIS — E8809 Other disorders of plasma-protein metabolism, not elsewhere classified: Secondary | ICD-10-CM | POA: Diagnosis not present

## 2024-01-08 DIAGNOSIS — Z79899 Other long term (current) drug therapy: Secondary | ICD-10-CM

## 2024-01-08 DIAGNOSIS — Z7982 Long term (current) use of aspirin: Secondary | ICD-10-CM

## 2024-01-08 DIAGNOSIS — Z794 Long term (current) use of insulin: Secondary | ICD-10-CM

## 2024-01-08 DIAGNOSIS — E538 Deficiency of other specified B group vitamins: Secondary | ICD-10-CM | POA: Diagnosis present

## 2024-01-08 DIAGNOSIS — Z7985 Long-term (current) use of injectable non-insulin antidiabetic drugs: Secondary | ICD-10-CM

## 2024-01-08 DIAGNOSIS — I4819 Other persistent atrial fibrillation: Principal | ICD-10-CM | POA: Diagnosis present

## 2024-01-08 DIAGNOSIS — Z90710 Acquired absence of both cervix and uterus: Secondary | ICD-10-CM

## 2024-01-08 DIAGNOSIS — Y92009 Unspecified place in unspecified non-institutional (private) residence as the place of occurrence of the external cause: Secondary | ICD-10-CM

## 2024-01-08 DIAGNOSIS — Z833 Family history of diabetes mellitus: Secondary | ICD-10-CM

## 2024-01-08 DIAGNOSIS — N179 Acute kidney failure, unspecified: Secondary | ICD-10-CM | POA: Diagnosis not present

## 2024-01-08 DIAGNOSIS — F419 Anxiety disorder, unspecified: Secondary | ICD-10-CM | POA: Diagnosis present

## 2024-01-08 DIAGNOSIS — Z8249 Family history of ischemic heart disease and other diseases of the circulatory system: Secondary | ICD-10-CM

## 2024-01-08 DIAGNOSIS — I4891 Unspecified atrial fibrillation: Secondary | ICD-10-CM | POA: Diagnosis present

## 2024-01-08 DIAGNOSIS — Z885 Allergy status to narcotic agent status: Secondary | ICD-10-CM

## 2024-01-08 DIAGNOSIS — E66813 Obesity, class 3: Secondary | ICD-10-CM | POA: Diagnosis present

## 2024-01-08 DIAGNOSIS — E11319 Type 2 diabetes mellitus with unspecified diabetic retinopathy without macular edema: Secondary | ICD-10-CM

## 2024-01-08 DIAGNOSIS — R42 Dizziness and giddiness: Secondary | ICD-10-CM | POA: Diagnosis present

## 2024-01-08 DIAGNOSIS — D62 Acute posthemorrhagic anemia: Secondary | ICD-10-CM | POA: Diagnosis present

## 2024-01-08 DIAGNOSIS — Z886 Allergy status to analgesic agent status: Secondary | ICD-10-CM

## 2024-01-08 DIAGNOSIS — X501XXA Overexertion from prolonged static or awkward postures, initial encounter: Secondary | ICD-10-CM

## 2024-01-08 DIAGNOSIS — Z79890 Hormone replacement therapy (postmenopausal): Secondary | ICD-10-CM

## 2024-01-08 DIAGNOSIS — I3139 Other pericardial effusion (noninflammatory): Secondary | ICD-10-CM | POA: Diagnosis present

## 2024-01-08 DIAGNOSIS — W01190A Fall on same level from slipping, tripping and stumbling with subsequent striking against furniture, initial encounter: Secondary | ICD-10-CM | POA: Diagnosis present

## 2024-01-08 DIAGNOSIS — R531 Weakness: Secondary | ICD-10-CM | POA: Diagnosis present

## 2024-01-08 DIAGNOSIS — S301XXA Contusion of abdominal wall, initial encounter: Secondary | ICD-10-CM | POA: Diagnosis present

## 2024-01-08 DIAGNOSIS — K219 Gastro-esophageal reflux disease without esophagitis: Secondary | ICD-10-CM | POA: Diagnosis present

## 2024-01-08 DIAGNOSIS — E1165 Type 2 diabetes mellitus with hyperglycemia: Secondary | ICD-10-CM | POA: Diagnosis present

## 2024-01-08 DIAGNOSIS — Z6841 Body Mass Index (BMI) 40.0 and over, adult: Secondary | ICD-10-CM

## 2024-01-08 DIAGNOSIS — Z888 Allergy status to other drugs, medicaments and biological substances status: Secondary | ICD-10-CM

## 2024-01-08 DIAGNOSIS — Z880 Allergy status to penicillin: Secondary | ICD-10-CM

## 2024-01-08 DIAGNOSIS — Z801 Family history of malignant neoplasm of trachea, bronchus and lung: Secondary | ICD-10-CM

## 2024-01-08 DIAGNOSIS — Z9109 Other allergy status, other than to drugs and biological substances: Secondary | ICD-10-CM

## 2024-01-08 DIAGNOSIS — Z791 Long term (current) use of non-steroidal anti-inflammatories (NSAID): Secondary | ICD-10-CM

## 2024-01-08 DIAGNOSIS — I10 Essential (primary) hypertension: Secondary | ICD-10-CM | POA: Diagnosis present

## 2024-01-08 DIAGNOSIS — Z87891 Personal history of nicotine dependence: Secondary | ICD-10-CM

## 2024-01-08 DIAGNOSIS — I959 Hypotension, unspecified: Secondary | ICD-10-CM | POA: Diagnosis present

## 2024-01-08 DIAGNOSIS — F32A Depression, unspecified: Secondary | ICD-10-CM | POA: Diagnosis present

## 2024-01-08 DIAGNOSIS — E785 Hyperlipidemia, unspecified: Secondary | ICD-10-CM | POA: Diagnosis present

## 2024-01-08 DIAGNOSIS — W19XXXA Unspecified fall, initial encounter: Principal | ICD-10-CM

## 2024-01-08 DIAGNOSIS — M1990 Unspecified osteoarthritis, unspecified site: Secondary | ICD-10-CM | POA: Diagnosis present

## 2024-01-08 LAB — CBC
HCT: 40.5 % (ref 36.0–46.0)
Hemoglobin: 12.6 g/dL (ref 12.0–15.0)
MCH: 27 pg (ref 26.0–34.0)
MCHC: 31.1 g/dL (ref 30.0–36.0)
MCV: 86.9 fL (ref 80.0–100.0)
Platelets: 251 10*3/uL (ref 150–400)
RBC: 4.66 MIL/uL (ref 3.87–5.11)
RDW: 14.3 % (ref 11.5–15.5)
WBC: 14.7 10*3/uL — ABNORMAL HIGH (ref 4.0–10.5)
nRBC: 0 % (ref 0.0–0.2)

## 2024-01-08 LAB — URINALYSIS, ROUTINE W REFLEX MICROSCOPIC
Bilirubin Urine: NEGATIVE
Glucose, UA: >=500 mg/dL — AB
Hgb urine dipstick: NEGATIVE
Ketones, ur: NEGATIVE mg/dL
Leukocytes,Ua: NEGATIVE
Nitrite: NEGATIVE
Protein, ur: 30 mg/dL — AB
Specific Gravity, Urine: 1.042 — ABNORMAL HIGH (ref 1.005–1.030)
pH: 5 (ref 5.0–8.0)

## 2024-01-08 LAB — BASIC METABOLIC PANEL
Anion gap: 8 (ref 5–15)
BUN: 20 mg/dL (ref 8–23)
CO2: 23 mmol/L (ref 22–32)
Calcium: 7.9 mg/dL — ABNORMAL LOW (ref 8.9–10.3)
Chloride: 106 mmol/L (ref 98–111)
Creatinine, Ser: 1.17 mg/dL — ABNORMAL HIGH (ref 0.44–1.00)
GFR, Estimated: 50 mL/min — ABNORMAL LOW (ref >60)
Glucose, Bld: 365 mg/dL — ABNORMAL HIGH (ref 70–99)
Potassium: 4.2 mmol/L (ref 3.5–5.1)
Sodium: 137 mmol/L (ref 135–145)

## 2024-01-08 LAB — CBG MONITORING, ED: Glucose-Capillary: 315 mg/dL — ABNORMAL HIGH (ref 70–99)

## 2024-01-08 LAB — I-STAT CG4 LACTIC ACID, ED: Lactic Acid, Venous: 2.1 mmol/L (ref 0.5–1.9)

## 2024-01-08 MED ORDER — SODIUM CHLORIDE 0.9 % IV BOLUS
500.0000 mL | Freq: Once | INTRAVENOUS | Status: AC
  Administered 2024-01-08: 500 mL via INTRAVENOUS

## 2024-01-08 MED ORDER — DILTIAZEM HCL-DEXTROSE 125-5 MG/125ML-% IV SOLN (PREMIX)
5.0000 mg/h | INTRAVENOUS | Status: DC
  Administered 2024-01-08: 5 mg/h via INTRAVENOUS
  Filled 2024-01-08: qty 125

## 2024-01-08 MED ORDER — IOHEXOL 350 MG/ML SOLN
75.0000 mL | Freq: Once | INTRAVENOUS | Status: AC | PRN
  Administered 2024-01-08: 75 mL via INTRAVENOUS

## 2024-01-08 NOTE — ED Notes (Signed)
 Please call, Sam deorfler [phone].

## 2024-01-08 NOTE — ED Notes (Addendum)
 Pt stated she dropped something to the ground and then slipped and fell. She did not have any dizziness, weakness, or nausea prior to the initial fall. She states she was in the floor for 20 minutes until EMS arrived.  It was not until after EMS helped her to her feet did she suddenly get nauseated, pale, and dizzy. Prior to the fall she was feeling well and carrying out her ADLs. She has a c-collar in place on arrival

## 2024-01-08 NOTE — ED Provider Notes (Signed)
 9:06 PM He care assumed from Dr. Deretha Emory.  At time of transfer of care, patient awaiting for results of CT imaging prior to admission.  Patient needs imaging to rule out significant traumatic injuries from this fall however she also was found to have A-fib with RVR and is now on a diltiazem drip with no history of this.  She will need admission for that as well.    Clinical Impression: 1. Fall, initial encounter   2. Atrial fibrillation with RVR (HCC)     Disposition: Admit  This note was prepared with assistance of Dragon voice recognition software. Occasional wrong-word or sound-a-like substitutions may have occurred due to the inherent limitations of voice recognition software.       Niv Darley, Canary Brim, MD 01/08/24 [phone]

## 2024-01-08 NOTE — ED Provider Notes (Addendum)
 [place] EMERGENCY DEPARTMENT AT [facility] Provider Note   CSN: [id number] Arrival date & time: 01/08/24  1835     History  Chief Complaint  Patient presents with   Hypotension    Andrea Marks is a 73 y.o. female.  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  Does have a large bruise there.  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  No trouble breathing.  Patient has a life alert so she contacted that EMS came out.  They got her up she felt a bit dizzy they noted that her blood pressure was 69/31 but and route and got much better.  She has some nausea without any vomiting they gave her 500 normal saline bolus.  Blood pressure here is in the low 100s systolic.  Patient states she felt fine all day long was not feeling ill no cough no congestion no fevers no nausea vomiting or diarrhea patient denied any chest pain shortness of breath denied being on blood thinners and also no loss of consciousness.  Patient was only on the floor for about 20 minutes.  Patient denies syncope and denies any loss of consciousness.  Past medical history significant for diabetes hyperlipidemia hypertension obesity.  Patient had an abdominal hysterectomy.  Patient is a former smoker quit 1995.       Home Medications Prior to Admission medications   Medication Sig Start Date End Date Taking? Authorizing Provider  acetaminophen (TYLENOL) 650 MG CR tablet Take 650 mg by mouth 2 (two) times daily as needed for pain. For pain    [provider]  aspirin 81 MG tablet Take 81 mg by mouth daily.    [provider]  benazepril (LOTENSIN) 20 MG tablet Take 1 tablet (20 mg total) by mouth daily. 10/18/23   Donato Schultz, DO  Blood Glucose Monitoring Suppl (ONE TOUCH ULTRA SYSTEM KIT) W/DEVICE KIT 1 kit by Does not apply route once. 04/06/13   Carlus Pavlov, MD  estradiol (ESTRACE) 1 MG tablet Take 1 tablet (1 mg total) by mouth daily. 09/23/23   Donato Schultz, DO  ezetimibe-simvastatin (VYTORIN) 10-40 MG tablet TAKE 1 TABLET AT BEDTIME 11/14/23   Zola Button, Myrene Buddy R, DO  fenofibrate 160 MG tablet TAKE 1 TABLET DAILY 10/18/23   Zola Button, Grayling Congress, DO  FLUoxetine (PROZAC) 20 MG tablet Take 3 tablets (60 mg total) by mouth daily. 10/18/23   Seabron Spates R, DO  glucose blood (ONETOUCH ULTRA) test strip Use to check blood sugar 3 times a day 11/08/19   Carlus Pavlov, MD  insulin aspart (NOVOLOG FLEXPEN) 100 UNIT/ML FlexPen Inject 12-15 Units into the skin 3 (three) times daily with meals. INJECT 12 TO 15 UNITS SUBCUTANEOUSLY 3 TIMES A   DAY WITH MEALS 11/04/23   Carlus Pavlov, MD  insulin glargine (LANTUS SOLOSTAR) 100 UNIT/ML Solostar Pen Inject 28 Units into the skin at bedtime. 11/18/23   Carlus Pavlov, MD  Insulin Pen Needle (PEN NEEDLES) 32G X 4 MM MISC USE AS INSTRUCTED WITH LANCETS. 12/10/20   Carlus Pavlov, MD  meloxicam (MOBIC) 15 MG tablet Take 1 tablet (15 mg total) by mouth daily. 08/14/21   Judi Saa, DO  metFORMIN (GLUCOPHAGE) 850 MG tablet Take 1 tablet in the AM, and 2 tablets in  the PM. 08/04/23   Carlus Pavlov, MD  OZEMPIC, 2 MG/DOSE, 8 MG/3ML SOPN INJECT 2MG  SUBCUTANEOUSLY  ONCE WEEKLY (EVERY 7 DAYS) AS DIRECTED 08/03/23   Carlus Pavlov, MD  pantoprazole (PROTONIX) 40 MG tablet Take 1 tablet (40 mg total) by mouth daily. 11/15/23   Donato Schultz, DO      Allergies    Hydrocodone-acetaminophen, Oxycodone hcl, Penicillins, and Prednisone    Review of Systems   Review of Systems  Constitutional:  Negative for chills and fever.  HENT:  Negative for ear pain and sore throat.   Eyes:  Negative for pain and visual disturbance.  Respiratory:  Negative for cough and shortness of breath.   Cardiovascular:  Negative for chest pain and palpitations.  Gastrointestinal:  Negative for  abdominal pain and vomiting.  Genitourinary:  Positive for flank pain. Negative for dysuria and hematuria.  Musculoskeletal:  Positive for back pain and neck pain. Negative for arthralgias.  Skin:  Negative for color change and rash.  Neurological:  Negative for seizures and syncope.  All other systems reviewed and are negative.   Physical Exam Updated Vital Signs BP 108/75   Pulse (!) 103   Temp 97.7 F (36.5 C) (Oral)   Resp 20   Ht 1.575 m (5\' 2" )   Wt 126.1 kg   SpO2 92%   BMI 50.85 kg/m  Physical Exam Vitals and nursing note reviewed.  Constitutional:      General: She is not in acute distress.    Appearance: Normal appearance. She is well-developed. She is not ill-appearing.  HENT:     Head: Normocephalic and atraumatic.     Mouth/Throat:     Mouth: Mucous membranes are moist.  Eyes:     Extraocular Movements: Extraocular movements intact.     Conjunctiva/sclera: Conjunctivae normal.     Pupils: Pupils are equal, round, and reactive to light.  Neck:     Comments: Cervical collar in place Cardiovascular:     Rate and Rhythm: Tachycardia present. Rhythm irregular.     Heart sounds: No murmur heard. Pulmonary:     Effort: Pulmonary effort is normal. No respiratory distress.     Breath sounds: Normal breath sounds.  Abdominal:     Palpations: Abdomen is soft.     Tenderness: There is no abdominal tenderness.  Musculoskeletal:        General: No swelling.     Right lower leg: Edema present.     Left lower leg: Edema present.     Comments: Pulses to both feet 1+ dorsalis pedis pulse. Patient with a large bruise to the left CVA area.  Probably measuring about 10 to 12 cm.  Some tenderness to palpation there.  Skin:    General: Skin is warm and dry.     Capillary Refill: Capillary refill takes less than 2 seconds.  Neurological:     General: No focal deficit present.     Mental Status: She is alert and oriented to person, place, and time.  Psychiatric:         Mood and Affect: Mood normal.     ED Results / Procedures / Treatments   Labs (all labs ordered are listed, but only abnormal results are displayed) Labs Reviewed  BASIC METABOLIC PANEL  CBC  URINALYSIS, ROUTINE W REFLEX MICROSCOPIC  CBG MONITORING, ED    EKG EKG Interpretation Date/Time:  Sunday January 08 2024 18:58:41 EDT Ventricular Rate:  110 PR Interval:    QRS Duration:  75 QT Interval:  331 QTC Calculation: 448 R Axis:   38  Text Interpretation: Atrial fibrillation Confirmed by Vanetta Mulders [phone]) on 01/08/2024 7:10:16 PM  Radiology No results found.  Procedures Procedures    Medications Ordered in ED Medications  diltiazem (CARDIZEM) 125 mg in dextrose 5% 125 mL (1 mg/mL) infusion (has no administration in time range)  sodium chloride 0.9 % bolus 500 mL (has no administration in time range)    ED Course/ Medical Decision Making/ A&P                                 Medical Decision Making Amount and/or Complexity of Data Reviewed Labs: ordered. Radiology: ordered.  Risk Prescription drug management.   CRITICAL CARE Performed by: Vanetta Mulders Total critical care time: 45 minutes Critical care time was exclusive of separately billable procedures and treating other patients. Critical care was necessary to treat or prevent imminent or life-threatening deterioration. Critical care was time spent personally by me on the following activities: development of treatment plan with patient and/or surrogate as well as nursing, discussions with consultants, evaluation of patient's response to treatment, examination of patient, obtaining history from patient or surrogate, ordering and performing treatments and interventions, ordering and review of laboratory studies, ordering and review of radiographic studies, pulse oximetry and re-evaluation of patient's condition.  Patient without a history of atrial fibrillation but seems to be in atrial for with RVR.   Heart rate going up to around 122.  Will start diltiazem drip.  Will give another 500 cc bolus.  Will get chest x-ray.  Regarding the fall there is a large bruised area to the right CVA.  For this we will get CT scan abdomen pelvis with contrast.  To rule out any kind of kidney injury or any other injury.  But ultimately patient will require admission.   Final Clinical Impression(s) / ED Diagnoses Final diagnoses:  Fall, initial encounter  Atrial fibrillation with RVR [facility])    Rx / DC Orders ED Discharge Orders     None         Vanetta Mulders, MD 01/08/24 1931    Vanetta Mulders, MD 01/08/24 [phone]

## 2024-01-08 NOTE — ED Notes (Signed)
 Patient transported to CT

## 2024-01-08 NOTE — ED Notes (Signed)
 ED Provider at bedside; cleared C-collar.

## 2024-01-08 NOTE — ED Triage Notes (Signed)
 Pt to the ed from home with a CC of dizziness with low blood pressure. Pt called ems for a lift assist because pt fell to the ground. EMS then found her to be hypotensive at 69/31. Pt has bruising to right flank.  Pt relay some nausea without vomiting. Ems gave 500 ns with 4 of Zofran. Pt relays anxiety with a hx of afib. Pt denies cp, sob, loc, blood thinners.

## 2024-01-08 NOTE — ED Notes (Signed)
 Sam D (friend) updated by patient.

## 2024-01-09 ENCOUNTER — Other Ambulatory Visit: Payer: Self-pay

## 2024-01-09 ENCOUNTER — Observation Stay (HOSPITAL_COMMUNITY)

## 2024-01-09 ENCOUNTER — Encounter (HOSPITAL_COMMUNITY): Payer: Self-pay | Admitting: Internal Medicine

## 2024-01-09 ENCOUNTER — Telehealth: Payer: Self-pay | Admitting: Cardiology

## 2024-01-09 ENCOUNTER — Inpatient Hospital Stay (INDEPENDENT_AMBULATORY_CARE_PROVIDER_SITE_OTHER)

## 2024-01-09 DIAGNOSIS — I480 Paroxysmal atrial fibrillation: Secondary | ICD-10-CM

## 2024-01-09 DIAGNOSIS — D62 Acute posthemorrhagic anemia: Secondary | ICD-10-CM | POA: Diagnosis present

## 2024-01-09 DIAGNOSIS — I959 Hypotension, unspecified: Secondary | ICD-10-CM | POA: Diagnosis present

## 2024-01-09 DIAGNOSIS — I4891 Unspecified atrial fibrillation: Secondary | ICD-10-CM

## 2024-01-09 DIAGNOSIS — Z87891 Personal history of nicotine dependence: Secondary | ICD-10-CM | POA: Diagnosis not present

## 2024-01-09 DIAGNOSIS — E785 Hyperlipidemia, unspecified: Secondary | ICD-10-CM | POA: Diagnosis present

## 2024-01-09 DIAGNOSIS — Z794 Long term (current) use of insulin: Secondary | ICD-10-CM | POA: Diagnosis not present

## 2024-01-09 DIAGNOSIS — Z79890 Hormone replacement therapy (postmenopausal): Secondary | ICD-10-CM | POA: Diagnosis not present

## 2024-01-09 DIAGNOSIS — K219 Gastro-esophageal reflux disease without esophagitis: Secondary | ICD-10-CM | POA: Diagnosis present

## 2024-01-09 DIAGNOSIS — S301XXA Contusion of abdominal wall, initial encounter: Secondary | ICD-10-CM | POA: Diagnosis present

## 2024-01-09 DIAGNOSIS — Z833 Family history of diabetes mellitus: Secondary | ICD-10-CM | POA: Diagnosis not present

## 2024-01-09 DIAGNOSIS — E1165 Type 2 diabetes mellitus with hyperglycemia: Secondary | ICD-10-CM | POA: Diagnosis present

## 2024-01-09 DIAGNOSIS — W01190A Fall on same level from slipping, tripping and stumbling with subsequent striking against furniture, initial encounter: Secondary | ICD-10-CM | POA: Diagnosis present

## 2024-01-09 DIAGNOSIS — I3139 Other pericardial effusion (noninflammatory): Secondary | ICD-10-CM | POA: Diagnosis present

## 2024-01-09 DIAGNOSIS — Z6841 Body Mass Index (BMI) 40.0 and over, adult: Secondary | ICD-10-CM | POA: Diagnosis not present

## 2024-01-09 DIAGNOSIS — Y92009 Unspecified place in unspecified non-institutional (private) residence as the place of occurrence of the external cause: Secondary | ICD-10-CM | POA: Diagnosis not present

## 2024-01-09 DIAGNOSIS — T148XXA Other injury of unspecified body region, initial encounter: Secondary | ICD-10-CM | POA: Diagnosis not present

## 2024-01-09 DIAGNOSIS — X501XXA Overexertion from prolonged static or awkward postures, initial encounter: Secondary | ICD-10-CM | POA: Diagnosis not present

## 2024-01-09 DIAGNOSIS — Z66 Do not resuscitate: Secondary | ICD-10-CM | POA: Diagnosis present

## 2024-01-09 DIAGNOSIS — Z79899 Other long term (current) drug therapy: Secondary | ICD-10-CM | POA: Diagnosis not present

## 2024-01-09 DIAGNOSIS — Z7984 Long term (current) use of oral hypoglycemic drugs: Secondary | ICD-10-CM | POA: Diagnosis not present

## 2024-01-09 DIAGNOSIS — F32A Depression, unspecified: Secondary | ICD-10-CM | POA: Diagnosis present

## 2024-01-09 DIAGNOSIS — Z888 Allergy status to other drugs, medicaments and biological substances status: Secondary | ICD-10-CM | POA: Diagnosis not present

## 2024-01-09 DIAGNOSIS — Z8249 Family history of ischemic heart disease and other diseases of the circulatory system: Secondary | ICD-10-CM | POA: Diagnosis not present

## 2024-01-09 DIAGNOSIS — I4819 Other persistent atrial fibrillation: Secondary | ICD-10-CM | POA: Diagnosis present

## 2024-01-09 DIAGNOSIS — N179 Acute kidney failure, unspecified: Secondary | ICD-10-CM | POA: Diagnosis not present

## 2024-01-09 DIAGNOSIS — E8809 Other disorders of plasma-protein metabolism, not elsewhere classified: Secondary | ICD-10-CM | POA: Diagnosis not present

## 2024-01-09 DIAGNOSIS — I10 Essential (primary) hypertension: Secondary | ICD-10-CM | POA: Diagnosis present

## 2024-01-09 LAB — CBG MONITORING, ED
Glucose-Capillary: 227 mg/dL — ABNORMAL HIGH (ref 70–99)
Glucose-Capillary: 188 mg/dL — ABNORMAL HIGH (ref 70–99)

## 2024-01-09 LAB — ECHOCARDIOGRAM COMPLETE
Area-P 1/2: 3.19 cm2
Height: 62 in
S' Lateral: 3.3 cm
Weight: 4448 [oz_av]

## 2024-01-09 LAB — HEMOGLOBIN AND HEMATOCRIT, BLOOD
HCT: 39.6 % (ref 36.0–46.0)
Hemoglobin: 12.2 g/dL (ref 12.0–15.0)

## 2024-01-09 LAB — CBC WITH DIFFERENTIAL/PLATELET
Abs Immature Granulocytes: 0.05 10*3/uL (ref 0.00–0.07)
Basophils Absolute: 0 10*3/uL (ref 0.0–0.1)
Basophils Relative: 0 %
Eosinophils Absolute: 0.1 10*3/uL (ref 0.0–0.5)
Eosinophils Relative: 1 %
HCT: 36.7 % (ref 36.0–46.0)
Hemoglobin: 11.4 g/dL — ABNORMAL LOW (ref 12.0–15.0)
Immature Granulocytes: 1 %
Lymphocytes Relative: 20 %
Lymphs Abs: 2 10*3/uL (ref 0.7–4.0)
MCH: 27.4 pg (ref 26.0–34.0)
MCHC: 31.1 g/dL (ref 30.0–36.0)
MCV: 88.2 fL (ref 80.0–100.0)
Monocytes Absolute: 0.9 10*3/uL (ref 0.1–1.0)
Monocytes Relative: 8 %
Neutro Abs: 7.1 10*3/uL (ref 1.7–7.7)
Neutrophils Relative %: 70 %
Platelets: 204 10*3/uL (ref 150–400)
RBC: 4.16 MIL/uL (ref 3.87–5.11)
RDW: 14.9 % (ref 11.5–15.5)
WBC: 10.2 10*3/uL (ref 4.0–10.5)
nRBC: 0 % (ref 0.0–0.2)

## 2024-01-09 LAB — T4, FREE: Free T4: 0.92 ng/dL (ref 0.61–1.12)

## 2024-01-09 LAB — COMPREHENSIVE METABOLIC PANEL
ALT: 13 U/L (ref 0–44)
AST: 17 U/L (ref 15–41)
Albumin: 2.8 g/dL — ABNORMAL LOW (ref 3.5–5.0)
Alkaline Phosphatase: 21 U/L — ABNORMAL LOW (ref 38–126)
Anion gap: 10 (ref 5–15)
BUN: 27 mg/dL — ABNORMAL HIGH (ref 8–23)
CO2: 25 mmol/L (ref 22–32)
Calcium: 8.4 mg/dL — ABNORMAL LOW (ref 8.9–10.3)
Chloride: 105 mmol/L (ref 98–111)
Creatinine, Ser: 1.5 mg/dL — ABNORMAL HIGH (ref 0.44–1.00)
GFR, Estimated: 37 mL/min — ABNORMAL LOW (ref >60)
Glucose, Bld: 225 mg/dL — ABNORMAL HIGH (ref 70–99)
Potassium: 4.8 mmol/L (ref 3.5–5.1)
Sodium: 140 mmol/L (ref 135–145)
Total Bilirubin: 0.6 mg/dL (ref 0.0–1.2)
Total Protein: 5 g/dL — ABNORMAL LOW (ref 6.5–8.1)

## 2024-01-09 LAB — I-STAT CG4 LACTIC ACID, ED: Lactic Acid, Venous: 2 mmol/L (ref 0.5–1.9)

## 2024-01-09 LAB — TSH: TSH: 3.02 u[IU]/mL (ref 0.350–4.500)

## 2024-01-09 LAB — MAGNESIUM: Magnesium: 1.4 mg/dL — ABNORMAL LOW (ref 1.7–2.4)

## 2024-01-09 LAB — GLUCOSE, CAPILLARY: Glucose-Capillary: 265 mg/dL — ABNORMAL HIGH (ref 70–99)

## 2024-01-09 LAB — LACTIC ACID, PLASMA: Lactic Acid, Venous: 1.9 mmol/L (ref 0.5–1.9)

## 2024-01-09 LAB — HEMOGLOBIN A1C
Hgb A1c MFr Bld: 7.8 % — ABNORMAL HIGH (ref 4.8–5.6)
Mean Plasma Glucose: 177.16 mg/dL

## 2024-01-09 LAB — PHOSPHORUS: Phosphorus: 3.9 mg/dL (ref 2.5–4.6)

## 2024-01-09 MED ORDER — ESTRADIOL 0.5 MG PO TABS
1.0000 mg | ORAL_TABLET | Freq: Every day | ORAL | Status: DC
  Administered 2024-01-09 – 2024-01-12 (×4): 1 mg via ORAL
  Filled 2024-01-09 (×4): qty 2

## 2024-01-09 MED ORDER — SODIUM CHLORIDE 0.9 % IV SOLN: INTRAVENOUS | Status: AC

## 2024-01-09 MED ORDER — EZETIMIBE 10 MG PO TABS
10.0000 mg | ORAL_TABLET | Freq: Every day | ORAL | Status: DC
  Administered 2024-01-09 – 2024-01-11 (×3): 10 mg via ORAL
  Filled 2024-01-09 (×4): qty 1

## 2024-01-09 MED ORDER — ACETAMINOPHEN 325 MG PO TABS
975.0000 mg | ORAL_TABLET | ORAL | Status: AC
  Administered 2024-01-09: 975 mg via ORAL
  Filled 2024-01-09: qty 3

## 2024-01-09 MED ORDER — BENAZEPRIL HCL 5 MG PO TABS
20.0000 mg | ORAL_TABLET | Freq: Every day | ORAL | Status: DC
  Administered 2024-01-09: 20 mg via ORAL
  Filled 2024-01-09: qty 1

## 2024-01-09 MED ORDER — FENOFIBRATE 160 MG PO TABS
160.0000 mg | ORAL_TABLET | Freq: Every day | ORAL | Status: DC
  Administered 2024-01-09 – 2024-01-12 (×4): 160 mg via ORAL
  Filled 2024-01-09 (×4): qty 1

## 2024-01-09 MED ORDER — INSULIN ASPART 100 UNIT/ML IJ SOLN
0.0000 [IU] | Freq: Three times a day (TID) | INTRAMUSCULAR | Status: DC
  Administered 2024-01-09: 8 [IU] via SUBCUTANEOUS
  Administered 2024-01-09 – 2024-01-10 (×2): 5 [IU] via SUBCUTANEOUS
  Administered 2024-01-10: 15 [IU] via SUBCUTANEOUS
  Administered 2024-01-10: 8 [IU] via SUBCUTANEOUS
  Administered 2024-01-11: 15 [IU] via SUBCUTANEOUS
  Administered 2024-01-11: 8 [IU] via SUBCUTANEOUS
  Administered 2024-01-11: 5 [IU] via SUBCUTANEOUS
  Administered 2024-01-12: 15 [IU] via SUBCUTANEOUS
  Administered 2024-01-12: 5 [IU] via SUBCUTANEOUS

## 2024-01-09 MED ORDER — MAGNESIUM SULFATE 4 GM/100ML IV SOLN
4.0000 g | Freq: Once | INTRAVENOUS | Status: AC
  Administered 2024-01-09: 4 g via INTRAVENOUS
  Filled 2024-01-09: qty 100

## 2024-01-09 MED ORDER — DILTIAZEM HCL ER COATED BEADS 120 MG PO CP24
120.0000 mg | ORAL_CAPSULE | Freq: Every day | ORAL | Status: DC
  Administered 2024-01-09 – 2024-01-12 (×4): 120 mg via ORAL
  Filled 2024-01-09 (×4): qty 1

## 2024-01-09 MED ORDER — SIMVASTATIN 20 MG PO TABS: 40.0000 mg | ORAL_TABLET | Freq: Every day | ORAL | Status: DC

## 2024-01-09 MED ORDER — EZETIMIBE-SIMVASTATIN 10-40 MG PO TABS
1.0000 | ORAL_TABLET | Freq: Every day | ORAL | Status: DC
  Filled 2024-01-09: qty 1

## 2024-01-09 MED ORDER — ATORVASTATIN CALCIUM 10 MG PO TABS
20.0000 mg | ORAL_TABLET | Freq: Every day | ORAL | Status: DC
  Administered 2024-01-09 – 2024-01-11 (×3): 20 mg via ORAL
  Filled 2024-01-09 (×3): qty 2

## 2024-01-09 MED ORDER — INSULIN GLARGINE 100 UNIT/ML ~~LOC~~ SOLN
18.0000 [IU] | Freq: Every day | SUBCUTANEOUS | Status: DC
  Administered 2024-01-09 – 2024-01-10 (×2): 18 [IU] via SUBCUTANEOUS
  Filled 2024-01-09 (×3): qty 0.18

## 2024-01-09 MED ORDER — FLUOXETINE HCL 20 MG PO CAPS
60.0000 mg | ORAL_CAPSULE | Freq: Every day | ORAL | Status: DC
  Administered 2024-01-09 – 2024-01-12 (×4): 60 mg via ORAL
  Filled 2024-01-09 (×4): qty 3

## 2024-01-09 MED ORDER — PANTOPRAZOLE SODIUM 40 MG PO TBEC
40.0000 mg | DELAYED_RELEASE_TABLET | Freq: Every day | ORAL | Status: DC
  Administered 2024-01-09 – 2024-01-12 (×4): 40 mg via ORAL
  Filled 2024-01-09 (×4): qty 1

## 2024-01-09 MED ORDER — ACETAMINOPHEN 325 MG PO TABS
650.0000 mg | ORAL_TABLET | ORAL | Status: DC | PRN
  Administered 2024-01-09 – 2024-01-11 (×3): 650 mg via ORAL
  Filled 2024-01-09 (×3): qty 2

## 2024-01-09 MED ORDER — SODIUM CHLORIDE 0.9 % IV BOLUS
500.0000 mL | Freq: Once | INTRAVENOUS | Status: AC
  Administered 2024-01-09: 500 mL via INTRAVENOUS

## 2024-01-09 NOTE — ED Provider Notes (Signed)
 Admission d/w Dr. Thornell Mule.  Holding anticoagulation in setting of flank hematoma.    Glynn Octave, MD 01/09/24 [phone]

## 2024-01-09 NOTE — ED Notes (Signed)
Admitting Provider at the bedside.  

## 2024-01-09 NOTE — Hospital Course (Addendum)
 The patient is a elderly 73 year old Caucasian female PMH significant for DMT2, HLD, B12 deficiency, severe obesity, HTN and other comorbidities presented the ED with a mechanical fall.  Further workup was done and she was found to have a very large left lower quadrant hematoma on imaging and was also found to be in A-fib with RVR when she presented with associated dizziness and hypotension.  Cardiology was consulted and was placed on a diltiazem drip and she spontaneously converted to normal sinus rhythm.  A decision was made not to anticoagulate her due to the large hematoma at this time.  PT OT recommending home health when stable for D/C but hospitalization has been complicated by an AKI which is worsening, as well as acute blood loss anemia in the setting of significant hematoma as her blood count continues to decrease as well.  Assessment and Plan:  New onset A Fib with RVR now s/p Conversion to NSR: Cardiology consulted and now signed off.  TSH was 3.020 and T4 was 0.92.  Placed on diltiazem gtt but then converted to NSR and transitioned to oral Diltiazem 120 mg p.o. daily w/ Cardiology Follow up in the Future. -Continue holding on anticoagulation until hematoma resolves with outpatient discussion with Cardiology/PCP. -Needs A Fib Clinic Referral and Cards arrange a 2-week monitor to assess A-fib burden and recurrence   Significant Hematoma / ABLA: CT Abd/Pelvis showed a large hematoma ((10.8 x 11.0 x 4.9 cm (volume = 300 cm^3)) within the left lower quadrant extraperitoneal fat with no active extravasation. Holding anticoagulation until hematoma resolves and shared decision in outpatient F/U w/ Cardiology. Currently holding baby ASA. Hgb/Hct Trending down and went from 12.6/40.5 on Admission and is now 10.1/32.9. Check Anemia Panel in the AM and repeat CBC in the a.m.   AKI: Normal Baseline Cr PTA. BUN/Cr Trend worsening and gone from 20/1.17 -> 27/1.50 -> 31/1.69. Getting IVF Hydration with NS at  75 mL/hr and will renew x1 Day. Check Renal U/S, Urine Osm, Urine Cr, and Urine Na+ along with Bladder Scanning. Avoid Nephrotoxic Medications, Contrast Dyes, Hypotension and Dehydration to Ensure Adequate Renal Perfusion and will need to Renally Adjust Meds. CTM and Trend Renal Function carefully and repeat CMP in the AM   Lung nodules: Noted to have Interval development of LLL zone 1.6 cm and Mid Lef lung zone 1.8 cm nodular density. Repeat PA and lateral CXR in 3-4 weeks and may need further imaging and evaluation after repeat to exclude Malignancy  Generalized Weakness: Has poor mobility, uses cane and walker at home. PT consulted and  recommending HHPT C/w IVF Hydration as above   HLD: C/w Atorvastatin 20 mg po Daily, Ezetimibe 10 mg po Daily and Fenofibrate 160 mg po Daily  Anxiety and Depression: C/w Fluoxetine 60 mg po Daily  Essential HTN: C/w Diltiazem 120 mg po Daily. CTM BP per Protcol. Last BP reading was   DM Type 2 w/ Hyperglycemia: HbA1c is 7.8 Hold Metformin and Ozempic; C/w Lantus 18 units sq Daily and Moderate Novolog SSI AC. CTM CBG per Protocol. CBG Ranging from 214-373 last 3 checks  GERD/GI Prophylaxis: C/w PPI w/ Pantoprazole 40 mg po Daily  HRT: C/w Estradiol 1 mg po Daily   HypoMag -> HyperMag: Replete with IV Mag Sulfate 4 grams yesterday and now Mag is 2.6. CTM and trend and Repeat Mag Level in the AM  Hypoalbuminemia: Albumin dropped to 2.5. CTM and Trend and repeat CMP in the AM  Class III (Super Morbid) Obesity:  Complicates overall prognosis and care. Estimated body mass index is 50.85 kg/m as calculated from the following:   Height as of this encounter: 5\' 2"  (1.575 m).   Weight as of this encounter: 126.1 kg. Weight Loss and Dietary Counseling given

## 2024-01-09 NOTE — Telephone Encounter (Signed)
 Patient currently here for fall, noted to be in A-fib RVR now back in sinus.  Ordering 2-week monitor to assess A-fib burden/recurrence.  Dr. Wyline Mood to read.  Has appointment with A-fib clinic.  [name]

## 2024-01-09 NOTE — ED Notes (Signed)
 Pt transported to vascular.

## 2024-01-09 NOTE — Progress Notes (Signed)
 Echocardiogram 2D Echocardiogram has been performed.  Warren Lacy Ia Leeb RDCS 01/09/2024, 12:31 PM

## 2024-01-09 NOTE — Consult Note (Signed)
 Cardiology Consultation   Patient ID: Andrea Marks MRN: [id number]; DOB: 1951-09-27  Admit date: 01/08/2024 Date of Consult: 01/09/2024  PCP:  Donato Schultz, DO    [facility] Providers Cardiologist:  None   Patient Profile:   Andrea Marks is a 73 y.o. female with a hx of diabetes, hyperlipidemia, obesity, hypertension who is being seen 01/09/2024 for the evaluation of new onset atrial fibrillation at the request of Dr. Cherlynn June.  History of Present Illness:   Andrea Marks has no prior cardiac history.  Today patient being evaluated after a fall.  Reports that she was bending over to pick something up, lost her footing and fell into the [place] in her kitchen.  She denies any preceding symptoms that would have led to the cause such as dizziness, lightheadedness.  No loss of consciousness with negative CT head.  CT of the abdominal and pelvis showed large hematoma in the left lower quadrant extraperitoneal.  She was noted to be in atrial fibrillation on arrival with RVR started on diltiazem drip.  Anticoagulation deferred given hematoma.  At this time she is back in sinus rhythm.  Patient reports being asymptomatic, no palpitations chest pain shortness of breath, orthopnea, significant peripheral edema, no significant fatigue.  She lives at home by herself, ambulates with a walker and a cane minimal distances due to arthritis.  She does report another fall within the past 2 years but reports being generally stable on her feet.  Chest x-ray showing incidental finding of lung nodules.  Normal TSH.  Normal T4.  Lactic acid 2.1 now 1.9.  Magnesium 1.4, getting supplementation.  Potassium 4.2 A1c 7.8% hemoglobin 12.2.  Creatinine 1.17.  Past Medical History:  Diagnosis Date   Allergy    Anxiety    Arthritis    Cataract    Depression    Diabetes mellitus    GERD (gastroesophageal reflux disease)    Hyperlipidemia    Hypertension    Obesity      Past Surgical History:  Procedure Laterality Date   ABDOMINAL HYSTERECTOMY  11/01/1989   BSO   EYE SURGERY     IR ABLATE LIVER CRYOABLATION  12/31/2021   IR RADIOLOGIST EVAL & MGMT  12/11/2021   IR RADIOLOGIST EVAL & MGMT  01/07/2022   IR RADIOLOGIST EVAL & MGMT  02/15/2022      Inpatient Medications: Scheduled Meds:  benazepril  20 mg Oral Daily   estradiol  1 mg Oral Daily   simvastatin  40 mg Oral QHS   And   ezetimibe  10 mg Oral Daily   fenofibrate  160 mg Oral Daily   FLUoxetine  60 mg Oral Daily   insulin aspart  0-15 Units Subcutaneous TID WC   insulin glargine  18 Units Subcutaneous QHS   ipratropium-albuterol  3 mL Nebulization Q6H   pantoprazole  40 mg Oral Daily   Continuous Infusions:  diltiazem (CARDIZEM) infusion 5 mg/hr (01/09/24 0422)   PRN Meds: acetaminophen  Allergies:    Allergies  Allergen Reactions   Apple Other (See Comments)    Unable to digest the apple peel.   Hydrocodone-Acetaminophen Other (See Comments)    unknown   Oxycodone Hcl Other (See Comments)    unknown   Penicillins Other (See Comments)    unknown   Prednisone     Patient has noted allergy to prednisone but she says it was a questionable history of mild nausea with prednisone and nothing more  Social History:   Social History   Socioeconomic History   Marital status: Widowed    Spouse name: Not on file   Number of children: 0   Years of education: 41   Highest education level: 12th grade  Occupational History   Occupation: retired from IT consultant: RETIRED  Tobacco Use   Smoking status: Former    Current packs/day: 0.00    Types: Cigarettes    Quit date: 1995    Years since quitting: 30.2    Passive exposure: Past   Smokeless tobacco: Never  Vaping Use   Vaping status: Never Used  Substance and Sexual Activity   Alcohol use: No   Drug use: No   Sexual activity: Not Currently    Partners: Male  Other Topics Concern   Not on file   Social History Narrative   Lives with husband   Right Handed   Drinks 3-5 cups caffeine daily   Social Drivers of Health   Financial Resource Strain: Low Risk  (10/17/2023)   Overall Financial Resource Strain (CARDIA)    Difficulty of Paying Living Expenses: Not hard at all  Food Insecurity: No Food Insecurity (01/09/2024)   Hunger Vital Sign    Worried About Running Out of Food in the Last Year: Never true    Ran Out of Food in the Last Year: Never true  Transportation Needs: No Transportation Needs (01/09/2024)   PRAPARE - Administrator, Civil Service (Medical): No    Lack of Transportation (Non-Medical): No  Physical Activity: Inactive (10/17/2023)   Exercise Vital Sign    Days of Exercise per Week: 0 days    Minutes of Exercise per Session: 0 min  Stress: Stress Concern Present (10/17/2023)   [employer] of Occupational Health - Occupational Stress Questionnaire    Feeling of Stress : To some extent  Social Connections: Socially Isolated (01/09/2024)   Social Connection and Isolation Panel [NHANES]    Frequency of Communication with Friends and Family: More than three times a week    Frequency of Social Gatherings with Friends and Family: Never    Attends Religious Services: Never    Database administrator or Organizations: No    Attends Banker Meetings: Never    Marital Status: Widowed  Intimate Partner Violence: Not At Risk (01/09/2024)   Humiliation, Afraid, Rape, and Kick questionnaire    Fear of Current or Ex-Partner: No    Emotionally Abused: No    Physically Abused: No    Sexually Abused: No    Family History:   Family History  Problem Relation Age of Onset   Macular degeneration Mother    Heart disease Mother        syncope   Aortic stenosis Mother    Lung disease Father 78       mesothelioma   Cancer Maternal Aunt        stomach   Heart disease Paternal Uncle        cabg   Diabetes Paternal Uncle    Heart disease  Paternal Uncle    Diabetes Paternal Uncle    Diabetes Paternal Uncle    Heart disease Paternal Uncle    Heart disease Paternal Uncle    Heart disease Paternal Uncle    Lung cancer Other        asbestos   Diabetes Paternal Grandmother    Cancer Other        lung  ROS:  Please see the history of present illness.  All other ROS reviewed and negative.     Physical Exam/Data:   Vitals:   01/09/24 0615 01/09/24 0630 01/09/24 0845 01/09/24 1130  BP: (!) 108/50 (!) 110/46 (!) 113/44 (!) 112/50  Pulse: 70 64 67 60  Resp: 17 17 18 19   Temp:   98.2 F (36.8 C)   TempSrc:   Oral   SpO2:   97% 97%  Weight:      Height:        Intake/Output Summary (Last 24 hours) at 01/09/2024 1353 Last data filed at 01/09/2024 1137 Gross per 24 hour  Intake 1000 ml  Output --  Net 1000 ml      01/08/2024    6:57 PM 12/23/2023   10:48 AM 10/18/2023    2:16 PM  Last 3 Weights  Weight (lbs) 278 lb 270 lb 270 lb 9.6 oz  Weight (kg) 126.1 kg 122.471 kg 122.743 kg     Body mass index is 50.85 kg/m.  General:  Well nourished, well developed, in no acute distress.  Obese HEENT: normal Neck: no JVD Vascular: No carotid bruits; Distal pulses 2+ bilaterally Cardiac:  normal S1, S2; RRR; no murmur.  2/6 murmur Lungs:  clear to auscultation bilaterally, no wheezing, rhonchi or rales  Abd: soft, nontender, no hepatomegaly  Ext: no edema Musculoskeletal:  No deformities, BUE and BLE strength normal and equal Skin: warm and dry  Neuro:  CNs 2-12 intact, no focal abnormalities noted Psych:  Normal affect   EKG:  The EKG was personally reviewed and demonstrates: Initial EKG showing atrial fibrillation with heart rate 110 no acute ST-T wave changes. Telemetry:  Telemetry was personally reviewed and demonstrates: Atrial fibrillation on arrival, converted back to sinus rhythm on diltiazem drip at 4 in the morning  Relevant CV Studies: Echocardiogram 01/09/2024 1. Left ventricular ejection fraction, by  estimation, is 55 to 60%. The  left ventricle has normal function. The left ventricle has no regional  wall motion abnormalities. There is mild concentric left ventricular  hypertrophy. Left ventricular diastolic  parameters were normal.   2. Right ventricular systolic function is normal. The right ventricular  size is normal. Mildly increased right ventricular wall thickness.  Tricuspid regurgitation signal is inadequate for assessing PA pressure.   3. Left atrial size was mildly dilated.   4. A small pericardial effusion is present. The pericardial effusion is  posterior to the left ventricle. There is no evidence of cardiac  tamponade.   5. The mitral valve is normal in structure. No evidence of mitral valve  regurgitation.   6. The aortic valve is tricuspid. Aortic valve regurgitation is not  visualized.   7. The inferior vena cava is normal in size with <50% respiratory  variability, suggesting right atrial pressure of 8 mmHg.    Laboratory Data:  High Sensitivity Troponin:  No results for input(s): "TROPONINIHS" in the last 720 hours.   Chemistry Recent Labs  Lab 01/08/24 1950 01/09/24 0500  NA 137  --   K 4.2  --   CL 106  --   CO2 23  --   GLUCOSE 365*  --   BUN 20  --   CREATININE 1.17*  --   CALCIUM 7.9*  --   MG  --  1.4*  GFRNONAA 50*  --   ANIONGAP 8  --     No results for input(s): "PROT", "ALBUMIN", "AST", "ALT", "ALKPHOS", "BILITOT" in the  last 168 hours. Lipids No results for input(s): "CHOL", "TRIG", "HDL", "LABVLDL", "LDLCALC", "CHOLHDL" in the last 168 hours.  Hematology Recent Labs  Lab 01/08/24 1950 01/09/24 0031 01/09/24 1304  WBC 14.7*  --  10.2  RBC 4.66  --  4.16  HGB 12.6 12.2 11.4*  HCT 40.5 39.6 36.7  MCV 86.9  --  88.2  MCH 27.0  --  27.4  MCHC 31.1  --  31.1  RDW 14.3  --  14.9  PLT 251  --  204   Thyroid  Recent Labs  Lab 01/09/24 0500  TSH 3.020  FREET4 0.92    BNPNo results for input(s): "BNP", "PROBNP" in the last 168  hours.  DDimer No results for input(s): "DDIMER" in the last 168 hours.   Radiology/Studies:  ECHOCARDIOGRAM COMPLETE Result Date: 01/09/2024    ECHOCARDIOGRAM REPORT   Patient Name:   Andrea Marks Date of Exam: 01/09/2024 Medical Rec #:  [id number]                  Height:       62.0 in Accession #:    [id number]                 Weight:       278.0 lb Date of Birth:  Mar 20, 1951                  BSA:          2.199 m Patient Age:    72 years                   BP:           114/48 mmHg Patient Gender: F                          HR:           61 bpm. Exam Location:  Inpatient Procedure: 2D Echo, Color Doppler and Cardiac Doppler (Both Spectral and Color            Flow Doppler were utilized during procedure). Indications:    I48.91* Unspecified atrial fibrillation  History:        Patient has no prior history of Echocardiogram examinations.  Sonographer:    Irving Burton Senior RDCS Referring Phys: [id number] Kateri Mc LATIF [facility] IMPRESSIONS  1. Left ventricular ejection fraction, by estimation, is 55 to 60%. The left ventricle has normal function. The left ventricle has no regional wall motion abnormalities. There is mild concentric left ventricular hypertrophy. Left ventricular diastolic parameters were normal.  2. Right ventricular systolic function is normal. The right ventricular size is normal. Mildly increased right ventricular wall thickness. Tricuspid regurgitation signal is inadequate for assessing PA pressure.  3. Left atrial size was mildly dilated.  4. A small pericardial effusion is present. The pericardial effusion is posterior to the left ventricle. There is no evidence of cardiac tamponade.  5. The mitral valve is normal in structure. No evidence of mitral valve regurgitation.  6. The aortic valve is tricuspid. Aortic valve regurgitation is not visualized.  7. The inferior vena cava is normal in size with <50% respiratory variability, suggesting right atrial pressure of 8 mmHg. FINDINGS  Left  Ventricle: Left ventricular ejection fraction, by estimation, is 55 to 60%. The left ventricle has normal function. The left ventricle has no regional wall motion abnormalities. The left ventricular internal cavity size was normal in size. There is  mild concentric left ventricular hypertrophy. Left ventricular diastolic parameters were normal. Right Ventricle: The right ventricular size is normal. Mildly increased right ventricular wall thickness. Right ventricular systolic function is normal. Tricuspid regurgitation signal is inadequate for assessing PA pressure. Left Atrium: Left atrial size was mildly dilated. Right Atrium: Right atrial size was normal in size. Pericardium: A small pericardial effusion is present. The pericardial effusion is posterior to the left ventricle. There is no evidence of cardiac tamponade. Mitral Valve: The mitral valve is normal in structure. No evidence of mitral valve regurgitation. Tricuspid Valve: The tricuspid valve is normal in structure. Tricuspid valve regurgitation is trivial. Aortic Valve: The aortic valve is tricuspid. Aortic valve regurgitation is not visualized. Pulmonic Valve: The pulmonic valve was normal in structure. Pulmonic valve regurgitation is not visualized. Aorta: The aortic root and ascending aorta are structurally normal, with no evidence of dilitation. Venous: The inferior vena cava is normal in size with less than 50% respiratory variability, suggesting right atrial pressure of 8 mmHg. IAS/Shunts: No atrial level shunt detected by color flow Doppler.  LEFT VENTRICLE PLAX 2D LVIDd:         4.80 cm   Diastology LVIDs:         3.30 cm   LV e' medial:    6.74 cm/s LV PW:         1.10 cm   LV E/e' medial:  13.9 LV IVS:        1.00 cm   LV e' lateral:   7.51 cm/s LVOT diam:     1.90 cm   LV E/e' lateral: 12.5 LV SV:         62 LV SV Index:   28 LVOT Area:     2.84 cm  RIGHT VENTRICLE RV S prime:     17.50 cm/s TAPSE (M-mode): 2.0 cm LEFT ATRIUM             Index         RIGHT ATRIUM           Index LA diam:        4.40 cm 2.00 cm/m   RA Area:     14.20 cm LA Vol (A2C):   73.1 ml 33.24 ml/m  RA Volume:   30.20 ml  13.73 ml/m LA Vol (A4C):   55.1 ml 25.06 ml/m LA Biplane Vol: 66.9 ml 30.42 ml/m  AORTIC VALVE LVOT Vmax:   125.00 cm/s LVOT Vmean:  74.000 cm/s LVOT VTI:    0.218 m  AORTA Ao Root diam: 3.10 cm Ao Asc diam:  3.50 cm MITRAL VALVE MV Area (PHT): 3.19 cm    SHUNTS MV Decel Time: 238 msec    Systemic VTI:  0.22 m MV E velocity: 93.55 cm/s  Systemic Diam: 1.90 cm MV A velocity: 62.60 cm/s MV E/A ratio:  1.49 Arvilla Meres MD Electronically signed by Arvilla Meres MD Signature Date/Time: 01/09/2024/12:40:02 PM    Final    CT ABDOMEN PELVIS W CONTRAST Result Date: 01/08/2024 CLINICAL DATA:  Blunt abdominal trauma EXAM: CT ABDOMEN AND PELVIS WITH CONTRAST TECHNIQUE: Multidetector CT imaging of the abdomen and pelvis was performed using the standard protocol following bolus administration of intravenous contrast. RADIATION DOSE REDUCTION: This exam was performed according to the departmental dose-optimization program which includes automated exposure control, adjustment of the mA and/or kV according to patient size and/or use of iterative reconstruction technique. CONTRAST:  75mL OMNIPAQUE IOHEXOL 350 MG/ML SOLN COMPARISON:  None Available. FINDINGS: Lower  chest: No acute abnormality.  Small pericardial effusion Hepatobiliary: Moderate hepatic steatosis. No enhancing intrahepatic mass. No intra or extrahepatic biliary ductal dilation. Gallbladder unremarkable. Pancreas: Unremarkable Spleen: Unremarkable Adrenals/Urinary Tract: The adrenal glands are unremarkable. Adrenal glands are unremarkable. Multiple cortical hypodensities are seen arising from the renal cortices bilaterally, the largest of which are compatible with simple cortical cysts for which no follow-up imaging is recommended. Partially calcified involuted cyst noted arising from the lower pole left  kidney for which no follow-up imaging is recommended. The kidneys are otherwise unremarkable. Bladder unremarkable. Stomach/Bowel: Stomach is within normal limits. Appendix appears normal. No evidence of bowel wall thickening, distention, or inflammatory changes. Vascular/Lymphatic: Aortic atherosclerosis. No enlarged abdominal or pelvic lymph nodes. Reproductive: Status post hysterectomy. No adnexal masses. Other: A 10.8 x 11.0 x 4.9 cm (volume = 300 cm^3) hematoma is seen within the left lower quadrant extraperitoneal fat, best seen at axial image # 75/3. No active extravasation is identified. No abdominal wall hernia. No abdominopelvic ascites or hemoperitoneum. Musculoskeletal: Osseous structures are age-appropriate. No acute bone abnormality IMPRESSION: 1. Large hematoma within the left lower quadrant extraperitoneal fat. No active extravasation identified. 2. Moderate hepatic steatosis. 3. Small pericardial effusion. Aortic Atherosclerosis (ICD10-I70.0). Electronically Signed   By: Helyn Numbers M.D.   On: 01/08/2024 21:54   CT Head Wo Contrast Result Date: 01/08/2024 CLINICAL DATA:  Trauma EXAM: CT HEAD WITHOUT CONTRAST TECHNIQUE: Contiguous axial images were obtained from the base of the skull through the vertex without intravenous contrast. RADIATION DOSE REDUCTION: This exam was performed according to the departmental dose-optimization program which includes automated exposure control, adjustment of the mA and/or kV according to patient size and/or use of iterative reconstruction technique. COMPARISON:  MRI brain 03/18/2021 FINDINGS: Brain: No evidence of acute infarction, hemorrhage, hydrocephalus, extra-axial collection or mass lesion/mass effect. Vascular: No hyperdense vessel or unexpected calcification. Skull: Normal. Negative for fracture or focal lesion. Sinuses/Orbits: No acute finding. Other: None. IMPRESSION: No acute intracranial abnormality. Electronically Signed   By: Darliss Cheney M.D.    On: 01/08/2024 21:54   CT Cervical Spine Wo Contrast Result Date: 01/08/2024 CLINICAL DATA:  Neck trauma (Age >= 65y) Polytrauma, blunt EXAM: CT CERVICAL SPINE WITHOUT CONTRAST TECHNIQUE: Multidetector CT imaging of the cervical spine was performed without intravenous contrast. Multiplanar CT image reconstructions were also generated. RADIATION DOSE REDUCTION: This exam was performed according to the departmental dose-optimization program which includes automated exposure control, adjustment of the mA and/or kV according to patient size and/or use of iterative reconstruction technique. COMPARISON:  None Available. FINDINGS: Alignment: Normal. Skull base and vertebrae: Grade cervical alignment is normal. The atlantodental interval is not widened. No acute fracture of the cervical spine. Soft tissues and spinal canal: No prevertebral fluid or swelling. No visible canal hematoma. Disc levels: There is endplate remodeling and disc annular calcifications at C3-C7 in keeping with changes of mild to moderate degenerative disc disease. Prevertebral soft tissues are not thickened sagittal reformats. 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 No high-grade canal stenosis. No significant neuroforaminal narrowing. Upper chest: Negative. Other: None IMPRESSION: 1. No acute fracture or listhesis of the cervical spine. 2. 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 Electronically Signed   By: Helyn Numbers M.D.   On: 01/08/2024 21:44   DG Chest Port 1 View Result Date: 01/08/2024 CLINICAL DATA:  Fall and new onset atrial fibrillation EXAM: PORTABLE CHEST  1 VIEW COMPARISON:  Chest x-ray 04/21/2017 FINDINGS: The heart and mediastinal contours are within normal limits. Interval development of lower left lung zone 1.6 cm and mid left lung zone 1.8 cm nodular density. No pulmonary edema. No pleural effusion. No pneumothorax.  No acute osseous abnormality. IMPRESSION: Interval development of lower left lung zone 1.6 cm and mid left lung zone 1.8 cm nodular density. Followup PA and lateral chest X-ray is recommended in 3-4 weeks following therapy to ensure resolution and exclude underlying malignancy. Electronically Signed   By: Tish Frederickson M.D.   On: 01/08/2024 20:04     Assessment and Plan:   New onset paroxysmal atrial fibrillation  Asymptomatic, started on IV diltiazem and converted to NSR around 0400.  Unfortunately sustained a fall and has a large hematoma.  Echocardiogram with preserved EF 55 to 60% with normal RV.  LA was mildly dilated.  There is a small pericardial effusion posterior to the LV without signs of tamponade.  Appears euvolemic. Will transition IV diltiazem to p.o. 120mg .  Holding off on DOAC till hematomas is resolved and in SR Will arrange follow-up with A-fib clinic. Discharge on 2-week monitor to assess recurrence of A-fib/burden.  This will be mailed to her. Normal thyroid function Get another EKG  Diabetes A1c 7.8%  Fall with hematoma Lung nodules Management per primary team  Risk Assessment/Risk Scores:     CHA2DS2-VASc Score = 4  } This indicates a 4.8% annual risk of stroke. The patient's score is based upon: CHF History: 0 HTN History: 1 Diabetes History: 1 Stroke History: 0 Vascular Disease History: 0 Age Score: 1 Gender Score: 1    For questions or updates, please contact  [facility] Please consult [email or web address] for contact info under    Signed, Abagail Kitchens, PA-C  01/09/2024 1:53 PM

## 2024-01-09 NOTE — Evaluation (Signed)
 Physical Therapy Evaluation Patient Details Name: Andrea Marks MRN: [id number] DOB: 07/02/1951 Today's Date: 01/09/2024  History of Present Illness  Pt is 73 yo female who presents on 01/08/24 after a fall in her kitchen followed by dizziness and found to be in Afib with RVR. Pt hypotensive in ED. CT head neg. CT abdomen showed large hematoma LLQ. PMH: DM, GERD, HTN, HLD  Clinical Impression  Pt admitted with above diagnosis. Pt from home alone in 2 level home with bedroom upstairs. Sister is planning to come down and stay with her for a few days when she is released. Pt reports independence at baseline, she is unsure why she fell. Able to come to sitting edge of stretcher with mod A. BP dropped from 104/81 to 84/63 with transition so ambulation deferred. Pt currently limited by L flank pain and orthostasis. Will follow acutely and recommend HHPT at d/c.  Pt currently with functional limitations due to the deficits listed below (see PT Problem List). Pt will benefit from acute skilled PT to increase their independence and safety with mobility to allow discharge.           If plan is discharge home, recommend the following: A little help with bathing/dressing/bathroom;Assistance with cooking/housework;Help with stairs or ramp for entrance;Assist for transportation   Can travel by private vehicle        Equipment Recommendations None recommended by PT  Recommendations for Other Services       Functional Status Assessment Patient has had a recent decline in their functional status and demonstrates the ability to make significant improvements in function in a reasonable and predictable amount of time.     Precautions / Restrictions Precautions Precautions: Fall Recall of Precautions/Restrictions: Intact Precaution/Restrictions Comments: pt unsure why she fell in kitchen. She thinks fall caused afib instead of other way around Restrictions Weight Bearing Restrictions Per  Provider Order: No      Mobility  Bed Mobility Overal bed mobility: Needs Assistance Bed Mobility: Supine to Sit, Sit to Supine     Supine to sit: Mod assist Sit to supine: Min assist   General bed mobility comments: mod A for elevation of trunk. Min A to bottom LE for return to supine. Able to scoot self up in bed    Transfers Overall transfer level: Needs assistance                 General transfer comment: deferred due to BP dropping from 104/81 to 84/63 in sitting    Ambulation/Gait               General Gait Details: deferred due to hypotension  Stairs            Wheelchair Mobility     Tilt Bed    Modified Rankin (Stroke Patients Only)       Balance Overall balance assessment: Mild deficits observed, not formally tested                                           Pertinent Vitals/Pain Pain Assessment Pain Assessment: Faces Faces Pain Scale: Hurts whole lot Pain Location: L side Pain Descriptors / Indicators: Sore Pain Intervention(s): Limited activity within patient's tolerance, Monitored during session    Home Living Family/patient expects to be discharged to:: Private residence Living Arrangements: Alone Available Help at Discharge: Family;Available PRN/intermittently;Friend(s) Type of Home: House Home Access:  Stairs to enter Entrance Stairs-Rails: Left;Right;Can reach both Entrance Stairs-Number of Steps: 2 Alternate Level Stairs-Number of Steps: flight Home Layout: Two level;1/2 bath on main level Home Equipment: Rolling Walker (2 wheels);Cane - single point;Grab bars - tub/shower      Prior Function Prior Level of Function : Independent/Modified Independent;Driving             Mobility Comments: uses RW, does not drive at night or in rain ADLs Comments: independent     Extremity/Trunk Assessment   Upper Extremity Assessment Upper Extremity Assessment: Generalized weakness    Lower Extremity  Assessment Lower Extremity Assessment: Generalized weakness (also B knee OA)    Cervical / Trunk Assessment Cervical / Trunk Assessment: Other exceptions Cervical / Trunk Exceptions: obesity  Communication   Communication Communication: No apparent difficulties    Cognition Arousal: Alert Behavior During Therapy: WFL for tasks assessed/performed   PT - Cognitive impairments: No apparent impairments                         Following commands: Intact       Cueing Cueing Techniques: Verbal cues     General Comments General comments (skin integrity, edema, etc.): SPO2 90's on RA. HR 65 bpm. Bruising noted LLQ. Gave pt IS and taught to use. Goal set to 1500    Exercises     Assessment/Plan    PT Assessment Patient needs continued PT services  PT Problem List Decreased strength;Decreased activity tolerance;Decreased balance;Decreased mobility;Pain       PT Treatment Interventions DME instruction;Gait training;Stair training;Functional mobility training;Therapeutic activities;Therapeutic exercise;Balance training;Patient/family education    PT Goals (Current goals can be found in the Care Plan section)  Acute Rehab PT Goals Patient Stated Goal: return home PT Goal Formulation: With patient Time For Goal Achievement: 01/23/24 Potential to Achieve Goals: Good    Frequency Min 2X/week     Co-evaluation               AM-PAC PT "6 Clicks" Mobility  Outcome Measure Help needed turning from your back to your side while in a flat bed without using bedrails?: A Little Help needed moving from lying on your back to sitting on the side of a flat bed without using bedrails?: A Little Help needed moving to and from a bed to a chair (including a wheelchair)?: A Lot Help needed standing up from a chair using your arms (e.g., wheelchair or bedside chair)?: A Lot Help needed to walk in hospital room?: Total Help needed climbing 3-5 steps with a railing? : Total 6  Click Score: 12    End of Session   Activity Tolerance: Patient limited by pain;Treatment limited secondary to medical complications (Comment) (hypotension) Patient left: in bed;with call bell/phone within reach Nurse Communication: Mobility status PT Visit Diagnosis: Muscle weakness (generalized) (M62.81);Pain Pain - Right/Left: Left Pain - part of body:  (LQ)    Time: 1610-9604 PT Time Calculation (min) (ACUTE ONLY): 25 min   Charges:   PT Evaluation $PT Eval Moderate Complexity: 1 Mod PT Treatments $Therapeutic Activity: 8-22 mins PT General Charges $$ ACUTE PT VISIT: 1 Visit         Lyanne Co, PT  Acute Rehab Services Secure chat preferred Office [phone]   Lawana Chambers Ociel Retherford 01/09/2024, 10:39 AM

## 2024-01-09 NOTE — H&P (Signed)
 History and Physical    Andrea Marks ZOX:[id number] DOB: August 23, 1951 DOA: 01/08/2024  PCP: Donato Schultz, DO   Patient coming from: home   Chief Complaint: fall, dizzy and low blood pressures afterwards  HPI: Andrea Marks is a 73 y.o. female with a pertinent history of    Patient is a 73 year old female with a history of diabetes, HLD, B12 deficiency, severe obesity, HTN who presents to ED after a fall.    Patient states that she was in her kitchen and bent down to pick something up, no LOC, or presyncope, and she proceeded to slip.  She is not really sure what happened.  She hit her flank on the [place] and noticed some pain.  She was not able to get up because of osteoarthritis and called EMS with her life alert.  EMS found her to be in A-fib with RVR. "They got her up and she felt a bit dizzy and blood pressure soft as below.   Thinking back, she wonders if maybe she felt a little fatigued the past few days but also associates it with the weather.  hasn't been sick at all recently.  She uses a cane at baseline.  She has never had A-fib before  In the ED, HR 61-1 17 and said to be in the 120s on arrival, RR 13, 97.7.  It was said that she was initially hypotensive even to 69/31 and gave her boluses of fluid and responded appropriatel.   glucose was elevated at 315, NA 137, K4.2, CO2 23, creatinine 1.17, lactic acid 2.1 and then 2.0, WBC 14.7, Hgb 12.6, PLT 251, UA with glucose but otherwise negative, blood cultures were taken.   CXR was negative but showed 2 lung nodules that need to be followed up outpatient. ekg afib w rate of 110 CT head - NAICA CT cervical - degenerative changes but no fx ct abd pelv w contrast - large hematoma within the left lower quadrant extraperitoneal fat , no active extravasation identified.   Started on a dilt drip with better control    Review of Systems: As per HPI otherwise 10 point review of systems negative.  Other  pertinents as below:  General -patient denies any fevers, chills, recent illness HEENT -denies any headaches, visual changes, syncope Cardio -denies chest pain, known palpitations Resp -denies cough, shortness of breath GI -denies nausea, vomiting, diarrhea, constipation GU -denies any urinary complaints, dysuria or changes in urination MSK -despite a fall, denies any new joint pains besides the above hematoma concern Skin -has some bruising but otherwise no rashes or skin concerns Neuro -denies any new numbness or weakness Psych -denies any new anxiety or depression Past Medical History:  Diagnosis Date   Allergy    Anxiety    Arthritis    Cataract    Depression    Diabetes mellitus    GERD (gastroesophageal reflux disease)    Hyperlipidemia    Hypertension    Obesity     Past Surgical History:  Procedure Laterality Date   ABDOMINAL HYSTERECTOMY  11/01/1989   BSO   EYE SURGERY     IR ABLATE LIVER CRYOABLATION  12/31/2021   IR RADIOLOGIST EVAL & MGMT  12/11/2021   IR RADIOLOGIST EVAL & MGMT  01/07/2022   IR RADIOLOGIST EVAL & MGMT  02/15/2022     reports that she quit smoking about 30 years ago. Her smoking use included cigarettes. She has been exposed to tobacco smoke. She has never used  smokeless tobacco. She reports that she does not drink alcohol and does not use drugs.  Allergies  Allergen Reactions   Apple Other (See Comments)    Unable to digest the apple peel.   Hydrocodone-Acetaminophen Other (See Comments)    unknown   Oxycodone Hcl Other (See Comments)    unknown   Penicillins Other (See Comments)    unknown   Prednisone     Patient has noted allergy to prednisone but she says it was a questionable history of mild nausea with prednisone and nothing more     Family History  Problem Relation Age of Onset   Macular degeneration Mother    Heart disease Mother        syncope   Aortic stenosis Mother    Lung disease Father 55       mesothelioma    Cancer Maternal Aunt        stomach   Heart disease Paternal Uncle        cabg   Diabetes Paternal Uncle    Heart disease Paternal Uncle    Diabetes Paternal Uncle    Diabetes Paternal Uncle    Heart disease Paternal Uncle    Heart disease Paternal Uncle    Heart disease Paternal Uncle    Lung cancer Other        asbestos   Diabetes Paternal Grandmother    Cancer Other        lung     Prior to Admission medications   Medication Sig Start Date End Date Taking? Authorizing Provider  acetaminophen (TYLENOL) 650 MG CR tablet Take 1,300 mg by mouth 2 (two) times daily as needed for pain. For pain   Yes [provider]  aspirin 81 MG tablet Take 81 mg by mouth daily.   Yes [provider]  benazepril (LOTENSIN) 20 MG tablet Take 1 tablet (20 mg total) by mouth daily. 10/18/23  Yes Seabron Spates R, DO  estradiol (ESTRACE) 1 MG tablet Take 1 tablet (1 mg total) by mouth daily. 09/23/23  Yes Seabron Spates R, DO  ezetimibe-simvastatin (VYTORIN) 10-40 MG tablet TAKE 1 TABLET AT BEDTIME 11/14/23  Yes Seabron Spates R, DO  fenofibrate 160 MG tablet TAKE 1 TABLET DAILY 10/18/23  Yes Seabron Spates R, DO  FLUoxetine (PROZAC) 20 MG tablet Take 3 tablets (60 mg total) by mouth daily. 10/18/23  Yes Seabron Spates R, DO  insulin aspart (NOVOLOG FLEXPEN) 100 UNIT/ML FlexPen Inject 12-15 Units into the skin 3 (three) times daily with meals. INJECT 12 TO 15 UNITS SUBCUTANEOUSLY 3 TIMES A   DAY WITH MEALS 11/04/23  Yes Carlus Pavlov, MD  insulin glargine (LANTUS SOLOSTAR) 100 UNIT/ML Solostar Pen Inject 28 Units into the skin at bedtime. Patient taking differently: Inject 18 Units into the skin at bedtime. 11/18/23  Yes Carlus Pavlov, MD  metFORMIN (GLUCOPHAGE) 850 MG tablet Take 1 tablet in the AM, and 2 tablets in the PM. 08/04/23  Yes Carlus Pavlov, MD  OZEMPIC, 2 MG/DOSE, 8 MG/3ML SOPN INJECT 2MG  SUBCUTANEOUSLY  ONCE WEEKLY (EVERY 7 DAYS) AS DIRECTED  08/03/23  Yes Carlus Pavlov, MD  pantoprazole (PROTONIX) 40 MG tablet Take 1 tablet (40 mg total) by mouth daily. 11/15/23  Yes Seabron Spates R, DO  tobramycin (TOBREX) 0.3 % ophthalmic solution Place 1 drop into both eyes 4 (four) times daily. Use eye solution for 3 days after eye injections 01/05/24  Yes [provider]  Physical Exam: Vitals:   01/09/24 0140 01/09/24 0150 01/09/24 0155 01/09/24 0200  BP:      Pulse: (!) 106 61 76 73  Resp: 13 20 18 13   Temp:      TempSrc:      SpO2: 92% 95% 95% 93%  Weight:      Height:        Constitutional: NAD, comfortable, obese Eyes: pupils equal  ENMT: MMM, throat without exudates or erythema Neck: normal, supple, no masses, no thyromegaly noted Respiratory: CTAB, nwob, no wheezing heard Cardiovascular: irregularly irregular rhythm. No significant murmurs Abdomen: NBS, NT,   Musculoskeletal: moving all 4 extremities, strength grossly intact 5/5 in the UE and LE's,   Skin: no rashes, lesions, ulcers. No induration Neurologic: CN 2-12 grossly intact. Sensation intact Psychiatric: AO appearing, mentation appropriate    Labs on Admission: I have personally reviewed following labs and imaging studies  CBC: Recent Labs  Lab 01/08/24 1950 01/09/24 0031  WBC 14.7*  --   HGB 12.6 12.2  HCT 40.5 39.6  MCV 86.9  --   PLT 251  --    Basic Metabolic Panel: Recent Labs  Lab 01/08/24 1950  NA 137  K 4.2  CL 106  CO2 23  GLUCOSE 365*  BUN 20  CREATININE 1.17*  CALCIUM 7.9*   GFR: Estimated Creatinine Clearance: 55.2 mL/min (A) (by C-G formula based on SCr of 1.17 mg/dL (H)). Liver Function Tests: No results for input(s): "AST", "ALT", "ALKPHOS", "BILITOT", "PROT", "ALBUMIN" in the last 168 hours. No results for input(s): "LIPASE", "AMYLASE" in the last 168 hours. No results for input(s): "AMMONIA" in the last 168 hours. Coagulation Profile: No results for input(s): "INR", "PROTIME" in the last 168  hours. Cardiac Enzymes: No results for input(s): "CKTOTAL", "CKMB", "CKMBINDEX", "TROPONINI" in the last 168 hours. BNP (last 3 results) No results for input(s): "PROBNP" in the last 8760 hours. HbA1C: No results for input(s): "HGBA1C" in the last 72 hours. CBG: Recent Labs  Lab 01/08/24 1956  GLUCAP 315*   Lipid Profile: No results for input(s): "CHOL", "HDL", "LDLCALC", "TRIG", "CHOLHDL", "LDLDIRECT" in the last 72 hours. Thyroid Function Tests: No results for input(s): "TSH", "T4TOTAL", "FREET4", "T3FREE", "THYROIDAB" in the last 72 hours. Anemia Panel: No results for input(s): "VITAMINB12", "FOLATE", "FERRITIN", "TIBC", "IRON", "RETICCTPCT" in the last 72 hours. Urine analysis:    Component Value Date/Time   COLORURINE YELLOW 01/08/2024 2004   APPEARANCEUR HAZY (A) 01/08/2024 2004   LABSPEC 1.042 (H) 01/08/2024 2004   PHURINE 5.0 01/08/2024 2004   GLUCOSEU >=500 (A) 01/08/2024 2004   HGBUR NEGATIVE 01/08/2024 2004   HGBUR negative 11/23/2010 0908   BILIRUBINUR NEGATIVE 01/08/2024 2004   BILIRUBINUR neg 12/19/2017 1157   KETONESUR NEGATIVE 01/08/2024 2004   PROTEINUR 30 (A) 01/08/2024 2004   UROBILINOGEN 0.2 12/19/2017 1157   UROBILINOGEN 0.2 11/23/2010 0908   NITRITE NEGATIVE 01/08/2024 2004   LEUKOCYTESUR NEGATIVE 01/08/2024 2004    Radiological Exams on Admission: CT ABDOMEN PELVIS W CONTRAST Result Date: 01/08/2024 CLINICAL DATA:  Blunt abdominal trauma EXAM: CT ABDOMEN AND PELVIS WITH CONTRAST TECHNIQUE: Multidetector CT imaging of the abdomen and pelvis was performed using the standard protocol following bolus administration of intravenous contrast. RADIATION DOSE REDUCTION: This exam was performed according to the departmental dose-optimization program which includes automated exposure control, adjustment of the mA and/or kV according to patient size and/or use of iterative reconstruction technique. CONTRAST:  75mL OMNIPAQUE IOHEXOL 350 MG/ML SOLN COMPARISON:  None  Available.  FINDINGS: Lower chest: No acute abnormality.  Small pericardial effusion Hepatobiliary: Moderate hepatic steatosis. No enhancing intrahepatic mass. No intra or extrahepatic biliary ductal dilation. Gallbladder unremarkable. Pancreas: Unremarkable Spleen: Unremarkable Adrenals/Urinary Tract: The adrenal glands are unremarkable. Adrenal glands are unremarkable. Multiple cortical hypodensities are seen arising from the renal cortices bilaterally, the largest of which are compatible with simple cortical cysts for which no follow-up imaging is recommended. Partially calcified involuted cyst noted arising from the lower pole left kidney for which no follow-up imaging is recommended. The kidneys are otherwise unremarkable. Bladder unremarkable. Stomach/Bowel: Stomach is within normal limits. Appendix appears normal. No evidence of bowel wall thickening, distention, or inflammatory changes. Vascular/Lymphatic: Aortic atherosclerosis. No enlarged abdominal or pelvic lymph nodes. Reproductive: Status post hysterectomy. No adnexal masses. Other: A 10.8 x 11.0 x 4.9 cm (volume = 300 cm^3) hematoma is seen within the left lower quadrant extraperitoneal fat, best seen at axial image # 75/3. No active extravasation is identified. No abdominal wall hernia. No abdominopelvic ascites or hemoperitoneum. Musculoskeletal: Osseous structures are age-appropriate. No acute bone abnormality IMPRESSION: 1. Large hematoma within the left lower quadrant extraperitoneal fat. No active extravasation identified. 2. Moderate hepatic steatosis. 3. Small pericardial effusion. Aortic Atherosclerosis (ICD10-I70.0). Electronically Signed   By: Helyn Numbers M.D.   On: 01/08/2024 21:54   CT Head Wo Contrast Result Date: 01/08/2024 CLINICAL DATA:  Trauma EXAM: CT HEAD WITHOUT CONTRAST TECHNIQUE: Contiguous axial images were obtained from the base of the skull through the vertex without intravenous contrast. RADIATION DOSE REDUCTION: This  exam was performed according to the departmental dose-optimization program which includes automated exposure control, adjustment of the mA and/or kV according to patient size and/or use of iterative reconstruction technique. COMPARISON:  MRI brain 03/18/2021 FINDINGS: Brain: No evidence of acute infarction, hemorrhage, hydrocephalus, extra-axial collection or mass lesion/mass effect. Vascular: No hyperdense vessel or unexpected calcification. Skull: Normal. Negative for fracture or focal lesion. Sinuses/Orbits: No acute finding. Other: None. IMPRESSION: No acute intracranial abnormality. Electronically Signed   By: Darliss Cheney M.D.   On: 01/08/2024 21:54   CT Cervical Spine Wo Contrast Result Date: 01/08/2024 CLINICAL DATA:  Neck trauma (Age >= 65y) Polytrauma, blunt EXAM: CT CERVICAL SPINE WITHOUT CONTRAST TECHNIQUE: Multidetector CT imaging of the cervical spine was performed without intravenous contrast. Multiplanar CT image reconstructions were also generated. RADIATION DOSE REDUCTION: This exam was performed according to the departmental dose-optimization program which includes automated exposure control, adjustment of the mA and/or kV according to patient size and/or use of iterative reconstruction technique. COMPARISON:  None Available. FINDINGS: Alignment: Normal. Skull base and vertebrae: Grade cervical alignment is normal. The atlantodental interval is not widened. No acute fracture of the cervical spine. Soft tissues and spinal canal: No prevertebral fluid or swelling. No visible canal hematoma. Disc levels: There is endplate remodeling and disc annular calcifications at C3-C7 in keeping with changes of mild to moderate degenerative disc disease. Prevertebral soft tissues are not thickened sagittal reformats. 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 No high-grade canal stenosis. No significant  neuroforaminal narrowing. Upper chest: Negative. Other: None IMPRESSION: 1. No acute fracture or listhesis of the cervical spine. 2. 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 Electronically Signed   By: Helyn Numbers M.D.   On: 01/08/2024 21:44   DG Chest Port 1 View Result Date: 01/08/2024 CLINICAL DATA:  Fall and new onset atrial fibrillation EXAM:  PORTABLE CHEST 1 VIEW COMPARISON:  Chest x-ray 04/21/2017 FINDINGS: The heart and mediastinal contours are within normal limits. Interval development of lower left lung zone 1.6 cm and mid left lung zone 1.8 cm nodular density. No pulmonary edema. No pleural effusion. No pneumothorax. No acute osseous abnormality. IMPRESSION: Interval development of lower left lung zone 1.6 cm and mid left lung zone 1.8 cm nodular density. Followup PA and lateral chest X-ray is recommended in 3-4 weeks following therapy to ensure resolution and exclude underlying malignancy. Electronically Signed   By: Tish Frederickson M.D.   On: 01/08/2024 20:04    EKG: Independently reviewed.  Assessment/Plan Principal Problem:   Atrial fibrillation, rapid (HCC)   new-onset A-fib w RVR better controlled with dilt, I suspect related to the trauma but maybe it was present previously.  Getting thyroid indices - Transition patient to oral diltiazem in the a.m. and set up with cardiology in the future --holding on anticoagulation, maybe risk benefit conversation with patient since CT scan didn't show any active extravasation or consider talks outaptient with PCP about whether to start.  SCD's --afib clinic referral  hematoma - hgb stable, holding anticoagulation until sure that it is stable.  I asked her to let us know if suddenly worsens or enlarges. holding baby aspirin.  Lung nodules - repeat cxr in 3-4 weeks to follow up lung nodules. might need pet scan   poor mobility, uses cane and walker at home.  PT here, consider HHPT to make sure to set up HEP and home eval to  prevent falls.  HLD, primary ppx, holding baby aspirin, cont vytorin and fenofibrate Mood - cont ' fluoxetine HTN DM, hyperglycemic some - hold metformin, ozempic cont lantus 18 units, SSI gerd - ppi hrt - estradiol DVT prophylaxis: None:high risk for potential bleed, SCD's Code Status: DNR  Patient stated that she would want to be DNR.  Her sister, Kendrick Fries, would make decisions if she wasn't able to.  I explained that with afib it can sometimes be unpredictable and might not be best idea to be DNR at this time but she stated that with her husband passing she would want to be DNR still.  Family Communication: na Disposition Plan: likely home with HHPT (specify when and where you expect patient to be discharged) Consults called: na (with names) Admission status: observation for now  A total of 85 minutes utilized during this admission.  Charlane Ferretti DO [facility]   If 7PM-7AM, please contact night-coverage [email or web address] Password [email or web address]  01/09/2024, 2:59 AM

## 2024-01-09 NOTE — ED Notes (Signed)
 Ice pack applied to left flank area for comfort.

## 2024-01-09 NOTE — Progress Notes (Signed)
 PROGRESS NOTE    Toney Lizaola Route  JOA:[id number] DOB: 11/10/1950 DOA: 01/08/2024 PCP: Donato Schultz, DO   Brief Narrative:  Is an elderly 73 year old Caucasian female past medical history significant for below to diabetes mellitus type 2, hyperlipidemia, B12 deficiency, severe obesity, hypertension and other comorbidities presented the ED with a fall.  States that she was in her kitchen and bent down to pick up something and proceeded to slip and fell.  Hit the table and was unable to give up.  Called 911 with her life alert and EMS came and got her found her to be in A-fib with RVR and she felt dizzy and saw blood pressures.  Had a significant hematoma noted.  Admitted for new onset A-fib with RVR and significant hematoma.  Monitoring blood count and blood pressures given that she was hypotensive on arrival.  Subsequently converted to normal sinus rhythm and cardiology is consulted.  PT OT recommending home health.  Monitoring overnight to make sure her hemoglobin has dropped further and will anticipate discharge in next 24 to 48 hours.  Assessment and Plan:  New onset A Fib with RVR now s/p Conversion to NSR: On Diltiazem gtt. Wean off and transition to orals. Check ECHO. Cardiology consulted fof further evaluation and recommendations. Getting thyroid indices. Transitioned patient to oral diltiazem in the a.m. and set up with cardiology in the future --holding on anticoagulation, maybe risk benefit conversation with patient since CT scan didn't show any active extravasation or consider talks outaptient with PCP about whether to start.  SCD's -Needs A Fib Clinic Referral and Cards arrange a 2-week monitor to assess A-fib burden and recurrence   Significant hematoma: -CT scan of the abdomen pelvis showed a large hematoma within the left lower quadrant extraperitoneal fat with no active extravasation Holding anticoagulation until sure that it is stable.  I asked her to let us know  if suddenly worsens or enlarges. holding baby aspirin. Hgb/Hct Trending down and Hgb/Hct is now 11.4/36.7. Check Anemia Panel in the AM.  Neurology consulted and recommending holding off anticoagulation till hematoma resolves and deciding in outpatient setting.   Lung nodules - repeat cxr in 3-4 weeks to follow up lung nodules. might need pet scan  AKI -BUN/Cr Trend: Recent Labs  Lab 01/08/24 1950 01/09/24 1304  BUN 20 27*  CREATININE 1.17* 1.50*  -Avoid Nephrotoxic Medications, Contrast Dyes, Hypotension and Dehydration to Ensure Adequate Renal Perfusion and will need to Renally Adjust Meds -Continue to Monitor and Trend Renal Function carefully and repeat CMP in the AM ;  Generalized Weakness: poor mobility, uses cane and walker at home.  PT here, consider HHPT to make sure to set up HEP and home eval to prevent falls. Hydrate   HLD, primary ppx, holding baby aspirin, cont vytorin and fenofibrate  Anxiety and Depression: C/w Fluoxetine   Essential HTN  DM Type 2: hyperglycemic some - hold metformin, ozempic cont lantus 18 units, SSI  GERD/GI Prophylaxis: PPI  HRT: C/w Estradiol  HypoMag: Mag Level was 1.4. Replete with IV Mag Sulfate 4 grams. CTM and trend and Repeat Mag Level in the AM  Class III (Super Morbid) Obesity: Complicates overall prognosis and care. Estimated body mass index is 50.85 kg/m as calculated from the following:   Height as of this encounter: 5\' 2"  (1.575 m).   Weight as of this encounter: 126.1 kg. Weight Loss and Dietary Counseling given  DVT prophylaxis: SCDs Start: 01/09/24 0435    Code Status: Limited:  Do not attempt resuscitation (DNR) -DNR-LIMITED -Do Not Intubate/DNI  Family Communication: No family present at bedside  Disposition Plan:  Level of care: Telemetry Cardiac Status is: Observation The patient remains OBS appropriate and will d/c before 2 midnights.   Consultants:  Cardiology  Procedures:  ECHOCARDIOGRAM  Antimicrobials:   Anti-infectives (From admission, onward)    None       Objective: Vitals:   01/09/24 1430 01/09/24 1620 01/09/24 1716 01/09/24 2000  BP: (!) 110/50 (!) 126/53 123/76 (!) 142/54  Pulse: 63  62   Resp: 16  20   Temp: 98.1 F (36.7 C)  97.9 F (36.6 C) 98 F (36.7 C)  TempSrc: Oral  Oral Oral  SpO2: 98%  95% 95%  Weight:      Height:        Intake/Output Summary (Last 24 hours) at 01/09/2024 2048 Last data filed at 01/09/2024 2000 Gross per 24 hour  Intake 1272.5 ml  Output --  Net 1272.5 ml   Filed Weights   01/08/24 1857  Weight: 126.1 kg   Data Reviewed: I have personally reviewed following labs and imaging studies  CBC: Recent Labs  Lab 01/08/24 1950 01/09/24 0031 01/09/24 1304  WBC 14.7*  --  10.2  NEUTROABS  --   --  7.1  HGB 12.6 12.2 11.4*  HCT 40.5 39.6 36.7  MCV 86.9  --  88.2  PLT 251  --  204   Basic Metabolic Panel: Recent Labs  Lab 01/08/24 1950 01/09/24 0500 01/09/24 1304  NA 137  --  140  K 4.2  --  4.8  CL 106  --  105  CO2 23  --  25  GLUCOSE 365*  --  225*  BUN 20  --  27*  CREATININE 1.17*  --  1.50*  CALCIUM 7.9*  --  8.4*  MG  --  1.4*  --   PHOS  --   --  3.9   GFR: Estimated Creatinine Clearance: 43.1 mL/min (A) (by C-G formula based on SCr of 1.5 mg/dL (H)). Liver Function Tests: Recent Labs  Lab 01/09/24 1304  AST 17  ALT 13  ALKPHOS 21*  BILITOT 0.6  PROT 5.0*  ALBUMIN 2.8*   No results for input(s): "LIPASE", "AMYLASE" in the last 168 hours. No results for input(s): "AMMONIA" in the last 168 hours. Coagulation Profile: No results for input(s): "INR", "PROTIME" in the last 168 hours. Cardiac Enzymes: No results for input(s): "CKTOTAL", "CKMB", "CKMBINDEX", "TROPONINI" in the last 168 hours. BNP (last 3 results) No results for input(s): "PROBNP" in the last 8760 hours. HbA1C: Recent Labs    01/09/24 0500  HGBA1C 7.8*   CBG: Recent Labs  Lab 01/08/24 1956 01/09/24 0756 01/09/24 1138  GLUCAP 315*  227* 188*   Lipid Profile: No results for input(s): "CHOL", "HDL", "LDLCALC", "TRIG", "CHOLHDL", "LDLDIRECT" in the last 72 hours. Thyroid Function Tests: Recent Labs    01/09/24 0500  TSH 3.020  FREET4 0.92   Anemia Panel: No results for input(s): "VITAMINB12", "FOLATE", "FERRITIN", "TIBC", "IRON", "RETICCTPCT" in the last 72 hours. Sepsis Labs: Recent Labs  Lab 01/08/24 2141 01/09/24 0037 01/09/24 0500  LATICACIDVEN 2.1* 2.0* 1.9    Recent Results (from the past 240 hours)  Culture, blood (Routine X 2) w Reflex to ID Panel     Status: None (Preliminary result)   Collection Time: 01/08/24  9:45 PM   Specimen: BLOOD  Result Value Ref Range Status   Specimen Description  BLOOD LEFT ANTECUBITAL  Final   Special Requests   Final    BOTTLES DRAWN AEROBIC AND ANAEROBIC Blood Culture adequate volume   Culture   Final    NO GROWTH < 12 HOURS Performed at [facility], [address] [place], [place] [address]    Report Status PENDING  Incomplete  Culture, blood (Routine X 2) w Reflex to ID Panel     Status: None (Preliminary result)   Collection Time: 01/08/24  9:45 PM   Specimen: BLOOD LEFT HAND  Result Value Ref Range Status   Specimen Description BLOOD LEFT HAND  Final   Special Requests   Final    BOTTLES DRAWN AEROBIC AND ANAEROBIC Blood Culture results may not be optimal due to an inadequate volume of blood received in culture bottles   Culture   Final    NO GROWTH < 12 HOURS Performed at [facility], [address], [place], [place] [address]    Report Status PENDING  Incomplete     Radiology Studies: ECHOCARDIOGRAM COMPLETE Result Date: 01/09/2024    ECHOCARDIOGRAM REPORT   Patient Name:   DASIE CHANCELLOR Date of Exam: 01/09/2024 Medical Rec #:  [id number]                  Height:       62.0 in Accession #:    [id number]                 Weight:       278.0 lb Date of Birth:  May 10, 1951                  BSA:          2.199 m Patient Age:    72  years                   BP:           114/48 mmHg Patient Gender: F                          HR:           61 bpm. Exam Location:  Inpatient Procedure: 2D Echo, Color Doppler and Cardiac Doppler (Both Spectral and Color            Flow Doppler were utilized during procedure). Indications:    I48.91* Unspecified atrial fibrillation  History:        Patient has no prior history of Echocardiogram examinations.  Sonographer:    Irving Burton Senior RDCS Referring Phys: [id number] Kateri Mc LATIF [facility] IMPRESSIONS  1. Left ventricular ejection fraction, by estimation, is 55 to 60%. The left ventricle has normal function. The left ventricle has no regional wall motion abnormalities. There is mild concentric left ventricular hypertrophy. Left ventricular diastolic parameters were normal.  2. Right ventricular systolic function is normal. The right ventricular size is normal. Mildly increased right ventricular wall thickness. Tricuspid regurgitation signal is inadequate for assessing PA pressure.  3. Left atrial size was mildly dilated.  4. A small pericardial effusion is present. The pericardial effusion is posterior to the left ventricle. There is no evidence of cardiac tamponade.  5. The mitral valve is normal in structure. No evidence of mitral valve regurgitation.  6. The aortic valve is tricuspid. Aortic valve regurgitation is not visualized.  7. The inferior vena cava is normal in size with <50% respiratory variability, suggesting right atrial pressure  of 8 mmHg. FINDINGS  Left Ventricle: Left ventricular ejection fraction, by estimation, is 55 to 60%. The left ventricle has normal function. The left ventricle has no regional wall motion abnormalities. The left ventricular internal cavity size was normal in size. There is  mild concentric left ventricular hypertrophy. Left ventricular diastolic parameters were normal. Right Ventricle: The right ventricular size is normal. Mildly increased right ventricular wall thickness. Right  ventricular systolic function is normal. Tricuspid regurgitation signal is inadequate for assessing PA pressure. Left Atrium: Left atrial size was mildly dilated. Right Atrium: Right atrial size was normal in size. Pericardium: A small pericardial effusion is present. The pericardial effusion is posterior to the left ventricle. There is no evidence of cardiac tamponade. Mitral Valve: The mitral valve is normal in structure. No evidence of mitral valve regurgitation. Tricuspid Valve: The tricuspid valve is normal in structure. Tricuspid valve regurgitation is trivial. Aortic Valve: The aortic valve is tricuspid. Aortic valve regurgitation is not visualized. Pulmonic Valve: The pulmonic valve was normal in structure. Pulmonic valve regurgitation is not visualized. Aorta: The aortic root and ascending aorta are structurally normal, with no evidence of dilitation. Venous: The inferior vena cava is normal in size with less than 50% respiratory variability, suggesting right atrial pressure of 8 mmHg. IAS/Shunts: No atrial level shunt detected by color flow Doppler.  LEFT VENTRICLE PLAX 2D LVIDd:         4.80 cm   Diastology LVIDs:         3.30 cm   LV e' medial:    6.74 cm/s LV PW:         1.10 cm   LV E/e' medial:  13.9 LV IVS:        1.00 cm   LV e' lateral:   7.51 cm/s LVOT diam:     1.90 cm   LV E/e' lateral: 12.5 LV SV:         62 LV SV Index:   28 LVOT Area:     2.84 cm  RIGHT VENTRICLE RV S prime:     17.50 cm/s TAPSE (M-mode): 2.0 cm LEFT ATRIUM             Index        RIGHT ATRIUM           Index LA diam:        4.40 cm 2.00 cm/m   RA Area:     14.20 cm LA Vol (A2C):   73.1 ml 33.24 ml/m  RA Volume:   30.20 ml  13.73 ml/m LA Vol (A4C):   55.1 ml 25.06 ml/m LA Biplane Vol: 66.9 ml 30.42 ml/m  AORTIC VALVE LVOT Vmax:   125.00 cm/s LVOT Vmean:  74.000 cm/s LVOT VTI:    0.218 m  AORTA Ao Root diam: 3.10 cm Ao Asc diam:  3.50 cm MITRAL VALVE MV Area (PHT): 3.19 cm    SHUNTS MV Decel Time: 238 msec    Systemic  VTI:  0.22 m MV E velocity: 93.55 cm/s  Systemic Diam: 1.90 cm MV A velocity: 62.60 cm/s MV E/A ratio:  1.49 Arvilla Meres MD Electronically signed by Arvilla Meres MD Signature Date/Time: 01/09/2024/12:40:02 PM    Final    CT ABDOMEN PELVIS W CONTRAST Result Date: 01/08/2024 CLINICAL DATA:  Blunt abdominal trauma EXAM: CT ABDOMEN AND PELVIS WITH CONTRAST TECHNIQUE: Multidetector CT imaging of the abdomen and pelvis was performed using the standard protocol following bolus administration of intravenous contrast. RADIATION DOSE  REDUCTION: This exam was performed according to the departmental dose-optimization program which includes automated exposure control, adjustment of the mA and/or kV according to patient size and/or use of iterative reconstruction technique. CONTRAST:  75mL OMNIPAQUE IOHEXOL 350 MG/ML SOLN COMPARISON:  None Available. FINDINGS: Lower chest: No acute abnormality.  Small pericardial effusion Hepatobiliary: Moderate hepatic steatosis. No enhancing intrahepatic mass. No intra or extrahepatic biliary ductal dilation. Gallbladder unremarkable. Pancreas: Unremarkable Spleen: Unremarkable Adrenals/Urinary Tract: The adrenal glands are unremarkable. Adrenal glands are unremarkable. Multiple cortical hypodensities are seen arising from the renal cortices bilaterally, the largest of which are compatible with simple cortical cysts for which no follow-up imaging is recommended. Partially calcified involuted cyst noted arising from the lower pole left kidney for which no follow-up imaging is recommended. The kidneys are otherwise unremarkable. Bladder unremarkable. Stomach/Bowel: Stomach is within normal limits. Appendix appears normal. No evidence of bowel wall thickening, distention, or inflammatory changes. Vascular/Lymphatic: Aortic atherosclerosis. No enlarged abdominal or pelvic lymph nodes. Reproductive: Status post hysterectomy. No adnexal masses. Other: A 10.8 x 11.0 x 4.9 cm (volume = 300  cm^3) hematoma is seen within the left lower quadrant extraperitoneal fat, best seen at axial image # 75/3. No active extravasation is identified. No abdominal wall hernia. No abdominopelvic ascites or hemoperitoneum. Musculoskeletal: Osseous structures are age-appropriate. No acute bone abnormality IMPRESSION: 1. Large hematoma within the left lower quadrant extraperitoneal fat. No active extravasation identified. 2. Moderate hepatic steatosis. 3. Small pericardial effusion. Aortic Atherosclerosis (ICD10-I70.0). Electronically Signed   By: Helyn Numbers M.D.   On: 01/08/2024 21:54   CT Head Wo Contrast Result Date: 01/08/2024 CLINICAL DATA:  Trauma EXAM: CT HEAD WITHOUT CONTRAST TECHNIQUE: Contiguous axial images were obtained from the base of the skull through the vertex without intravenous contrast. RADIATION DOSE REDUCTION: This exam was performed according to the departmental dose-optimization program which includes automated exposure control, adjustment of the mA and/or kV according to patient size and/or use of iterative reconstruction technique. COMPARISON:  MRI brain 03/18/2021 FINDINGS: Brain: No evidence of acute infarction, hemorrhage, hydrocephalus, extra-axial collection or mass lesion/mass effect. Vascular: No hyperdense vessel or unexpected calcification. Skull: Normal. Negative for fracture or focal lesion. Sinuses/Orbits: No acute finding. Other: None. IMPRESSION: No acute intracranial abnormality. Electronically Signed   By: Darliss Cheney M.D.   On: 01/08/2024 21:54   CT Cervical Spine Wo Contrast Result Date: 01/08/2024 CLINICAL DATA:  Neck trauma (Age >= 65y) Polytrauma, blunt EXAM: CT CERVICAL SPINE WITHOUT CONTRAST TECHNIQUE: Multidetector CT imaging of the cervical spine was performed without intravenous contrast. Multiplanar CT image reconstructions were also generated. RADIATION DOSE REDUCTION: This exam was performed according to the departmental dose-optimization program which  includes automated exposure control, adjustment of the mA and/or kV according to patient size and/or use of iterative reconstruction technique. COMPARISON:  None Available. FINDINGS: Alignment: Normal. Skull base and vertebrae: Grade cervical alignment is normal. The atlantodental interval is not widened. No acute fracture of the cervical spine. Soft tissues and spinal canal: No prevertebral fluid or swelling. No visible canal hematoma. Disc levels: There is endplate remodeling and disc annular calcifications at C3-C7 in keeping with changes of mild to moderate degenerative disc disease. Prevertebral soft tissues are not thickened sagittal reformats. 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 No high-grade canal stenosis. No significant neuroforaminal narrowing. Upper chest: Negative. Other: None IMPRESSION: 1. No acute fracture or listhesis of the cervical  spine. 2. 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 Electronically Signed   By: Helyn Numbers M.D.   On: 01/08/2024 21:44   DG Chest Port 1 View Result Date: 01/08/2024 CLINICAL DATA:  Fall and new onset atrial fibrillation EXAM: PORTABLE CHEST 1 VIEW COMPARISON:  Chest x-ray 04/21/2017 FINDINGS: The heart and mediastinal contours are within normal limits. Interval development of lower left lung zone 1.6 cm and mid left lung zone 1.8 cm nodular density. No pulmonary edema. No pleural effusion. No pneumothorax. No acute osseous abnormality. IMPRESSION: Interval development of lower left lung zone 1.6 cm and mid left lung zone 1.8 cm nodular density. Followup PA and lateral chest X-ray is recommended in 3-4 weeks following therapy to ensure resolution and exclude underlying malignancy. Electronically Signed   By: Tish Frederickson M.D.   On: 01/08/2024 20:04   Scheduled Meds:  atorvastatin  20 mg Oral QHS   benazepril  20 mg Oral Daily   diltiazem  120  mg Oral Daily   estradiol  1 mg Oral Daily   ezetimibe  10 mg Oral Daily   fenofibrate  160 mg Oral Daily   FLUoxetine  60 mg Oral Daily   insulin aspart  0-15 Units Subcutaneous TID WC   insulin glargine  18 Units Subcutaneous QHS   pantoprazole  40 mg Oral Daily   Continuous Infusions:  sodium chloride 75 mL/hr at 01/09/24 1622     LOS: 0 days   Merlene Laughter, DO [facility] Available via Epic secure chat 7am-7pm After these hours, please refer to coverage provider listed on [email or web address] 01/09/2024, 8:48 PM

## 2024-01-09 NOTE — Progress Notes (Unsigned)
 Enrolled patient for a 14 day Zio XT monitor to be mailed to patients home   M. Branch to read

## 2024-01-09 NOTE — Progress Notes (Signed)
 Patient brought to 4E from ED. VSS. Telemetry box applied, CCMD notified. Patient oriented to room and staff. Call bell in reach.  Kenard Gower, RN

## 2024-01-10 ENCOUNTER — Inpatient Hospital Stay (HOSPITAL_COMMUNITY)

## 2024-01-10 DIAGNOSIS — T148XXA Other injury of unspecified body region, initial encounter: Secondary | ICD-10-CM | POA: Diagnosis not present

## 2024-01-10 DIAGNOSIS — D62 Acute posthemorrhagic anemia: Secondary | ICD-10-CM | POA: Diagnosis not present

## 2024-01-10 DIAGNOSIS — N179 Acute kidney failure, unspecified: Secondary | ICD-10-CM | POA: Diagnosis not present

## 2024-01-10 DIAGNOSIS — I4891 Unspecified atrial fibrillation: Secondary | ICD-10-CM | POA: Diagnosis not present

## 2024-01-10 LAB — COMPREHENSIVE METABOLIC PANEL
ALT: 13 U/L (ref 0–44)
AST: 16 U/L (ref 15–41)
Albumin: 2.5 g/dL — ABNORMAL LOW (ref 3.5–5.0)
Alkaline Phosphatase: 21 U/L — ABNORMAL LOW (ref 38–126)
Anion gap: 4 — ABNORMAL LOW (ref 5–15)
BUN: 31 mg/dL — ABNORMAL HIGH (ref 8–23)
CO2: 25 mmol/L (ref 22–32)
Calcium: 8 mg/dL — ABNORMAL LOW (ref 8.9–10.3)
Chloride: 109 mmol/L (ref 98–111)
Creatinine, Ser: 1.69 mg/dL — ABNORMAL HIGH (ref 0.44–1.00)
GFR, Estimated: 32 mL/min — ABNORMAL LOW (ref >60)
Glucose, Bld: 205 mg/dL — ABNORMAL HIGH (ref 70–99)
Potassium: 4.7 mmol/L (ref 3.5–5.1)
Sodium: 138 mmol/L (ref 135–145)
Total Bilirubin: 0.6 mg/dL (ref 0.0–1.2)
Total Protein: 4.5 g/dL — ABNORMAL LOW (ref 6.5–8.1)

## 2024-01-10 LAB — GLUCOSE, CAPILLARY
Glucose-Capillary: 214 mg/dL — ABNORMAL HIGH (ref 70–99)
Glucose-Capillary: 373 mg/dL — ABNORMAL HIGH (ref 70–99)
Glucose-Capillary: 286 mg/dL — ABNORMAL HIGH (ref 70–99)
Glucose-Capillary: 307 mg/dL — ABNORMAL HIGH (ref 70–99)

## 2024-01-10 LAB — CBC WITH DIFFERENTIAL/PLATELET
Abs Immature Granulocytes: 0.06 10*3/uL (ref 0.00–0.07)
Basophils Absolute: 0.1 10*3/uL (ref 0.0–0.1)
Basophils Relative: 1 %
Eosinophils Absolute: 0.2 10*3/uL (ref 0.0–0.5)
Eosinophils Relative: 3 %
HCT: 32.9 % — ABNORMAL LOW (ref 36.0–46.0)
Hemoglobin: 10.1 g/dL — ABNORMAL LOW (ref 12.0–15.0)
Immature Granulocytes: 1 %
Lymphocytes Relative: 27 %
Lymphs Abs: 2.4 10*3/uL (ref 0.7–4.0)
MCH: 26.6 pg (ref 26.0–34.0)
MCHC: 30.7 g/dL (ref 30.0–36.0)
MCV: 86.8 fL (ref 80.0–100.0)
Monocytes Absolute: 0.9 10*3/uL (ref 0.1–1.0)
Monocytes Relative: 10 %
Neutro Abs: 5.5 10*3/uL (ref 1.7–7.7)
Neutrophils Relative %: 58 %
Platelets: 164 10*3/uL (ref 150–400)
RBC: 3.79 MIL/uL — ABNORMAL LOW (ref 3.87–5.11)
RDW: 15.1 % (ref 11.5–15.5)
WBC: 9.2 10*3/uL (ref 4.0–10.5)
nRBC: 0 % (ref 0.0–0.2)

## 2024-01-10 LAB — T3: T3, Total: 121 ng/dL (ref 71–180)

## 2024-01-10 LAB — PHOSPHORUS: Phosphorus: 4.9 mg/dL — ABNORMAL HIGH (ref 2.5–4.6)

## 2024-01-10 LAB — CREATININE, URINE, RANDOM: Creatinine, Urine: 140 mg/dL

## 2024-01-10 LAB — OSMOLALITY, URINE: Osmolality, Ur: 666 mosm/kg (ref 300–900)

## 2024-01-10 LAB — MAGNESIUM: Magnesium: 2.6 mg/dL — ABNORMAL HIGH (ref 1.7–2.4)

## 2024-01-10 LAB — SODIUM, URINE, RANDOM: Sodium, Ur: 37 mmol/L

## 2024-01-10 MED ORDER — SODIUM CHLORIDE 0.9 % IV SOLN: INTRAVENOUS | Status: AC

## 2024-01-10 NOTE — Progress Notes (Signed)
 Physical Therapy Treatment Patient Details Name: Andrea Marks MRN: [id number] DOB: 26-Sep-1951 Today's Date: 01/10/2024   History of Present Illness Pt is 73 yo female who presents on 01/08/24 after a fall in her kitchen followed by dizziness and found to be in Afib with RVR. Pt hypotensive in ED. CT head neg. CT abdomen showed large hematoma LLQ. PMH: DM, GERD, HTN, HLD    PT Comments  Pt tolerates treatment well, progressing to ambulation of household distances. Pt does express a fear of falling during session, but confidence seems improved by the end of treatment after walking multiple laps within the room. PT will plan to assess ambulation with a rollator next session, as well as stair negotiation as the pt has 2 steps to enter her home. PT continues to recommend HHPT.    If plan is discharge home, recommend the following: A little help with bathing/dressing/bathroom;Assistance with cooking/housework;Help with stairs or ramp for entrance;Assist for transportation   Can travel by private vehicle        Equipment Recommendations  None recommended by PT    Recommendations for Other Services       Precautions / Restrictions Precautions Precautions: Fall Recall of Precautions/Restrictions: Intact Restrictions Weight Bearing Restrictions Per Provider Order: No     Mobility  Bed Mobility Overal bed mobility: Needs Assistance Bed Mobility: Rolling, Sidelying to Sit, Sit to Sidelying Rolling: Contact guard assist Sidelying to sit: Contact guard assist, HOB elevated, Used rails     Sit to sidelying: Min assist      Transfers Overall transfer level: Needs assistance Equipment used: Rolling walker (2 wheels) Transfers: Sit to/from Stand Sit to Stand: Contact guard assist                Ambulation/Gait Ambulation/Gait assistance: Contact guard assist Gait Distance (Feet): 80 Feet Assistive device: Rolling walker (2 wheels) Gait Pattern/deviations:  Step-through pattern Gait velocity: reduced Gait velocity interpretation: <1.8 ft/sec, indicate of risk for recurrent falls   General Gait Details: slowed step-through gait, redued stride length   Stairs             Wheelchair Mobility     Tilt Bed    Modified Rankin (Stroke Patients Only)       Balance Overall balance assessment: Needs assistance Sitting-balance support: Feet supported, No upper extremity supported Sitting balance-Leahy Scale: Good     Standing balance support: Bilateral upper extremity supported, Reliant on assistive device for balance Standing balance-Leahy Scale: Poor                              Communication Communication Communication: No apparent difficulties  Cognition Arousal: Alert Behavior During Therapy: WFL for tasks assessed/performed   PT - Cognitive impairments: No apparent impairments                         Following commands: Intact      Cueing Cueing Techniques: Verbal cues  Exercises      General Comments General comments (skin integrity, edema, etc.): VSS on RA, HR in 80s, BP 156/63 s/p mobility      Pertinent Vitals/Pain Pain Assessment Pain Assessment: Faces Faces Pain Scale: Hurts even more Pain Location: L flank Pain Descriptors / Indicators: Sore Pain Intervention(s): Monitored during session    Home Living  Prior Function            PT Goals (current goals can now be found in the care plan section) Acute Rehab PT Goals Patient Stated Goal: return home Progress towards PT goals: Progressing toward goals    Frequency    Min 2X/week      PT Plan      Co-evaluation              AM-PAC PT "6 Clicks" Mobility   Outcome Measure  Help needed turning from your back to your side while in a flat bed without using bedrails?: A Little Help needed moving from lying on your back to sitting on the side of a flat bed without using  bedrails?: A Little Help needed moving to and from a bed to a chair (including a wheelchair)?: A Little Help needed standing up from a chair using your arms (e.g., wheelchair or bedside chair)?: A Little Help needed to walk in hospital room?: A Little Help needed climbing 3-5 steps with a railing? : A Lot 6 Click Score: 17    End of Session   Activity Tolerance: Patient tolerated treatment well Patient left: in bed;with call bell/phone within reach;with bed alarm set Nurse Communication: Mobility status PT Visit Diagnosis: Muscle weakness (generalized) (M62.81);Pain Pain - Right/Left: Left Pain - part of body:  (L flank at hematoma site)     Time: 5784-6962 PT Time Calculation (min) (ACUTE ONLY): 23 min  Charges:    $Gait Training: 8-22 mins $Therapeutic Activity: 8-22 mins PT General Charges $$ ACUTE PT VISIT: 1 Visit                     Arlyss Gandy, PT, DPT Acute Rehabilitation Office [phone]    Arlyss Gandy 01/10/2024, 6:07 PM

## 2024-01-10 NOTE — Progress Notes (Signed)
 Mobility Specialist Progress Note:    01/10/24 1011  Mobility  Activity Transferred from bed to chair  Level of Assistance Minimal assist, patient does 75% or more  Assistive Device Front wheel walker  Distance Ambulated (ft) 3 ft  Activity Response Tolerated well  Mobility Referral Yes  Mobility visit 1 Mobility  Mobility Specialist Start Time (ACUTE ONLY) 1000  Mobility Specialist Stop Time (ACUTE ONLY) 1010  Mobility Specialist Time Calculation (min) (ACUTE ONLY) 10 min   Pt received in bed, agreeable to mobility session. Required MinA to stand from bed, CGA to pivot to chair. Tolerated well, VSS throughout. Pt reports having fear of falling that limits mobility. Left pt in chair with all needs met, call bell in reach, all needs met.    Feliciana Rossetti Mobility Specialist Please contact via Special educational needs teacher or  Rehab office at [phone]

## 2024-01-10 NOTE — Progress Notes (Signed)
 PROGRESS NOTE    Andrea Marks  ZOX:[id number] DOB: February 01, 1951 DOA: 01/08/2024 PCP: Donato Schultz, DO   Brief Narrative:  The patient is a elderly 73 year old Caucasian female PMH significant for DMT2, HLD, B12 deficiency, severe obesity, HTN and other comorbidities presented the ED with a mechanical fall.  Further workup was done and she was found to have a very large left lower quadrant hematoma on imaging and was also found to be in A-fib with RVR when she presented with associated dizziness and hypotension.  Cardiology was consulted and was placed on a diltiazem drip and she spontaneously converted to normal sinus rhythm.  A decision was made not to anticoagulate her due to the large hematoma at this time.  PT OT recommending home health when stable for D/C but hospitalization has been complicated by an AKI which is worsening, as well as acute blood loss anemia in the setting of significant hematoma as her blood count continues to decrease as well.  Assessment and Plan:  New onset A Fib with RVR now s/p Conversion to NSR: Cardiology consulted and now signed off.  TSH was 3.020 and T4 was 0.92.  Placed on diltiazem gtt but then converted to NSR and transitioned to oral Diltiazem 120 mg p.o. daily w/ Cardiology Follow up in the Future. -Continue holding on anticoagulation until hematoma resolves with outpatient discussion with Cardiology/PCP. -Needs A Fib Clinic Referral and Cards arrange a 2-week monitor to assess A-fib burden and recurrence   Significant Hematoma / ABLA: CT Abd/Pelvis showed a large hematoma ((10.8 x 11.0 x 4.9 cm (volume = 300 cm^3)) within the left lower quadrant extraperitoneal fat with no active extravasation. Holding anticoagulation until hematoma resolves and shared decision in outpatient F/U w/ Cardiology. Currently holding baby ASA. Hgb/Hct Trending down and went from 12.6/40.5 on Admission and is now 10.1/32.9. Check Anemia Panel in the AM and repeat  CBC in the a.m.   AKI: Normal Baseline Cr PTA. BUN/Cr Trend worsening and gone from 20/1.17 -> 27/1.50 -> 31/1.69. Getting IVF Hydration with NS at 75 mL/hr and will renew x1 Day. Check Renal U/S, Urine Osm, Urine Cr, and Urine Na+ along with Bladder Scanning. Avoid Nephrotoxic Medications, Contrast Dyes, Hypotension and Dehydration to Ensure Adequate Renal Perfusion and will need to Renally Adjust Meds. CTM and Trend Renal Function carefully and repeat CMP in the AM   Lung nodules: Noted to have Interval development of LLL zone 1.6 cm and Mid Lef lung zone 1.8 cm nodular density. Repeat PA and lateral CXR in 3-4 weeks and may need further imaging and evaluation after repeat to exclude Malignancy  Generalized Weakness: Has poor mobility, uses cane and walker at home. PT consulted and  recommending HHPT C/w IVF Hydration as above   HLD: C/w Atorvastatin 20 mg po Daily, Ezetimibe 10 mg po Daily and Fenofibrate 160 mg po Daily  Anxiety and Depression: C/w Fluoxetine 60 mg po Daily  Essential HTN: C/w Diltiazem 120 mg po Daily. CTM BP per Protcol. Last BP reading was   DM Type 2 w/ Hyperglycemia: HbA1c is 7.8 Hold Metformin and Ozempic; C/w Lantus 18 units sq Daily and Moderate Novolog SSI AC. CTM CBG per Protocol. CBG Ranging from 214-373 last 3 checks  GERD/GI Prophylaxis: C/w PPI w/ Pantoprazole 40 mg po Daily  HRT: C/w Estradiol 1 mg po Daily   HypoMag -> HyperMag: Replete with IV Mag Sulfate 4 grams yesterday and now Mag is 2.6. CTM and trend and Repeat Mag  Level in the AM  Hypoalbuminemia: Albumin dropped to 2.5. CTM and Trend and repeat CMP in the AM  Class III (Super Morbid) Obesity: Complicates overall prognosis and care. Estimated body mass index is 50.85 kg/m as calculated from the following:   Height as of this encounter: 5\' 2"  (1.575 m).   Weight as of this encounter: 126.1 kg. Weight Loss and Dietary Counseling given  DVT prophylaxis: SCDs Start: 01/09/24 0435    Code Status:  Limited: Do not attempt resuscitation (DNR) -DNR-LIMITED -Do Not Intubate/DNI  Family Communication: No family currently at bedside  Disposition Plan:  Level of care: Telemetry Cardiac Status is: Inpatient Remains inpatient appropriate because: Further clinical improvement and workup in her AKI and evaluation for blood count to ensure stability   Consultants:  Cardiology  Procedures:  As delineated as above; Echocardiogram  Antimicrobials:  Anti-infectives (From admission, onward)    None       Subjective: Seen and examined at bedside and she is seen in the chair.  Felt okay and states that she just continues to feel weak.  No nausea or vomiting.  Thinks that she is urinating okay.  Denied any chest pain or shortness of breath.  Objective: Vitals:   01/10/24 0330 01/10/24 0757 01/10/24 1201 01/10/24 1617  BP: (!) 126/56 (!) 135/56 129/60 (!) 134/50  Pulse: 67 65 69 61  Resp:  12 20 20   Temp: 97.7 F (36.5 C) 98.2 F (36.8 C) 98.6 F (37 C) 98.8 F (37.1 C)  TempSrc: Oral Oral Oral Oral  SpO2:  93% 93% 96%  Weight:      Height:        Intake/Output Summary (Last 24 hours) at 01/10/2024 1822 Last data filed at 01/10/2024 1007 Gross per 24 hour  Intake 833.96 ml  Output 250 ml  Net 583.96 ml   Filed Weights   01/08/24 1857  Weight: 126.1 kg   Examination: Physical Exam:  Constitutional: WN/WD super morbidly obese Caucasian female seen in the chair at bedside. Respiratory: Diminished to auscultation bilaterally, no wheezing, rales, rhonchi or crackles. Normal respiratory effort and patient is not tachypenic. No accessory muscle use.  Unlabored breathing Cardiovascular: RRR, no murmurs / rubs / gallops. S1 and S2 auscultated.  Minimal extremity edema Abdomen: Soft, non-tender, distended secondary body habitus. Bowel sounds positive.  GU: Deferred. Musculoskeletal: No clubbing / cyanosis of digits/nails. No joint deformity upper and lower extremities.  Skin: Has a  very large area of bruising and ecchymosis on the left side of her back that is painful to palpate Neurologic: CN 2-12 grossly intact with no focal deficits. Romberg sign and cerebellar reflexes not assessed.  Psychiatric: Normal judgment and insight. Alert and oriented x 3.   Data Reviewed: I have personally reviewed following labs and imaging studies  CBC: Recent Labs  Lab 01/08/24 1950 01/09/24 0031 01/09/24 1304 01/10/24 0340  WBC 14.7*  --  10.2 9.2  NEUTROABS  --   --  7.1 5.5  HGB 12.6 12.2 11.4* 10.1*  HCT 40.5 39.6 36.7 32.9*  MCV 86.9  --  88.2 86.8  PLT 251  --  204 164   Basic Metabolic Panel: Recent Labs  Lab 01/08/24 1950 01/09/24 0500 01/09/24 1304 01/10/24 0340  NA 137  --  140 138  K 4.2  --  4.8 4.7  CL 106  --  105 109  CO2 23  --  25 25  GLUCOSE 365*  --  225* 205*  BUN 20  --  27* 31*  CREATININE 1.17*  --  1.50* 1.69*  CALCIUM 7.9*  --  8.4* 8.0*  MG  --  1.4*  --  2.6*  PHOS  --   --  3.9 4.9*   GFR: Estimated Creatinine Clearance: 38.2 mL/min (A) (by C-G formula based on SCr of 1.69 mg/dL (H)). Liver Function Tests: Recent Labs  Lab 01/09/24 1304 01/10/24 0340  AST 17 16  ALT 13 13  ALKPHOS 21* 21*  BILITOT 0.6 0.6  PROT 5.0* 4.5*  ALBUMIN 2.8* 2.5*   No results for input(s): "LIPASE", "AMYLASE" in the last 168 hours. No results for input(s): "AMMONIA" in the last 168 hours. Coagulation Profile: No results for input(s): "INR", "PROTIME" in the last 168 hours. Cardiac Enzymes: No results for input(s): "CKTOTAL", "CKMB", "CKMBINDEX", "TROPONINI" in the last 168 hours. BNP (last 3 results) No results for input(s): "PROBNP" in the last 8760 hours. HbA1C: Recent Labs    01/09/24 0500  HGBA1C 7.8*   CBG: Recent Labs  Lab 01/09/24 0756 01/09/24 1138 01/09/24 2201 01/10/24 0634 01/10/24 1200  GLUCAP 227* 188* 265* 214* 373*   Lipid Profile: No results for input(s): "CHOL", "HDL", "LDLCALC", "TRIG", "CHOLHDL", "LDLDIRECT" in  the last 72 hours. Thyroid Function Tests: Recent Labs    01/09/24 0500  TSH 3.020  FREET4 0.92   Anemia Panel: No results for input(s): "VITAMINB12", "FOLATE", "FERRITIN", "TIBC", "IRON", "RETICCTPCT" in the last 72 hours. Sepsis Labs: Recent Labs  Lab 01/08/24 2141 01/09/24 0037 01/09/24 0500  LATICACIDVEN 2.1* 2.0* 1.9   Recent Results (from the past 240 hours)  Culture, blood (Routine X 2) w Reflex to ID Panel     Status: None (Preliminary result)   Collection Time: 01/08/24  9:45 PM   Specimen: BLOOD  Result Value Ref Range Status   Specimen Description BLOOD LEFT ANTECUBITAL  Final   Special Requests   Final    BOTTLES DRAWN AEROBIC AND ANAEROBIC Blood Culture adequate volume   Culture   Final    NO GROWTH 2 DAYS Performed at [facility], [address] [place], [place] [address]    Report Status PENDING  Incomplete  Culture, blood (Routine X 2) w Reflex to ID Panel     Status: None (Preliminary result)   Collection Time: 01/08/24  9:45 PM   Specimen: BLOOD LEFT HAND  Result Value Ref Range Status   Specimen Description BLOOD LEFT HAND  Final   Special Requests   Final    BOTTLES DRAWN AEROBIC AND ANAEROBIC Blood Culture results may not be optimal due to an inadequate volume of blood received in culture bottles   Culture   Final    NO GROWTH 2 DAYS Performed at [facility], [address], [place], [place] [address]    Report Status PENDING  Incomplete    Radiology Studies: ECHOCARDIOGRAM COMPLETE Result Date: 01/09/2024    ECHOCARDIOGRAM REPORT   Patient Name:   Andrea Marks Date of Exam: 01/09/2024 Medical Rec #:  [id number]                  Height:       62.0 in Accession #:    [id number]                 Weight:       278.0 lb Date of Birth:  1951-05-26                  BSA:  2.199 m Patient Age:    72 years                   BP:           114/48 mmHg Patient Gender: F                          HR:           61 bpm. Exam  Location:  Inpatient Procedure: 2D Echo, Color Doppler and Cardiac Doppler (Both Spectral and Color            Flow Doppler were utilized during procedure). Indications:    I48.91* Unspecified atrial fibrillation  History:        Patient has no prior history of Echocardiogram examinations.  Sonographer:    Irving Burton Senior RDCS Referring Phys: [id number] Kateri Mc LATIF [facility] IMPRESSIONS  1. Left ventricular ejection fraction, by estimation, is 55 to 60%. The left ventricle has normal function. The left ventricle has no regional wall motion abnormalities. There is mild concentric left ventricular hypertrophy. Left ventricular diastolic parameters were normal.  2. Right ventricular systolic function is normal. The right ventricular size is normal. Mildly increased right ventricular wall thickness. Tricuspid regurgitation signal is inadequate for assessing PA pressure.  3. Left atrial size was mildly dilated.  4. A small pericardial effusion is present. The pericardial effusion is posterior to the left ventricle. There is no evidence of cardiac tamponade.  5. The mitral valve is normal in structure. No evidence of mitral valve regurgitation.  6. The aortic valve is tricuspid. Aortic valve regurgitation is not visualized.  7. The inferior vena cava is normal in size with <50% respiratory variability, suggesting right atrial pressure of 8 mmHg. FINDINGS  Left Ventricle: Left ventricular ejection fraction, by estimation, is 55 to 60%. The left ventricle has normal function. The left ventricle has no regional wall motion abnormalities. The left ventricular internal cavity size was normal in size. There is  mild concentric left ventricular hypertrophy. Left ventricular diastolic parameters were normal. Right Ventricle: The right ventricular size is normal. Mildly increased right ventricular wall thickness. Right ventricular systolic function is normal. Tricuspid regurgitation signal is inadequate for assessing PA pressure. Left  Atrium: Left atrial size was mildly dilated. Right Atrium: Right atrial size was normal in size. Pericardium: A small pericardial effusion is present. The pericardial effusion is posterior to the left ventricle. There is no evidence of cardiac tamponade. Mitral Valve: The mitral valve is normal in structure. No evidence of mitral valve regurgitation. Tricuspid Valve: The tricuspid valve is normal in structure. Tricuspid valve regurgitation is trivial. Aortic Valve: The aortic valve is tricuspid. Aortic valve regurgitation is not visualized. Pulmonic Valve: The pulmonic valve was normal in structure. Pulmonic valve regurgitation is not visualized. Aorta: The aortic root and ascending aorta are structurally normal, with no evidence of dilitation. Venous: The inferior vena cava is normal in size with less than 50% respiratory variability, suggesting right atrial pressure of 8 mmHg. IAS/Shunts: No atrial level shunt detected by color flow Doppler.  LEFT VENTRICLE PLAX 2D LVIDd:         4.80 cm   Diastology LVIDs:         3.30 cm   LV e' medial:    6.74 cm/s LV PW:         1.10 cm   LV E/e' medial:  13.9 LV IVS:  1.00 cm   LV e' lateral:   7.51 cm/s LVOT diam:     1.90 cm   LV E/e' lateral: 12.5 LV SV:         62 LV SV Index:   28 LVOT Area:     2.84 cm  RIGHT VENTRICLE RV S prime:     17.50 cm/s TAPSE (M-mode): 2.0 cm LEFT ATRIUM             Index        RIGHT ATRIUM           Index LA diam:        4.40 cm 2.00 cm/m   RA Area:     14.20 cm LA Vol (A2C):   73.1 ml 33.24 ml/m  RA Volume:   30.20 ml  13.73 ml/m LA Vol (A4C):   55.1 ml 25.06 ml/m LA Biplane Vol: 66.9 ml 30.42 ml/m  AORTIC VALVE LVOT Vmax:   125.00 cm/s LVOT Vmean:  74.000 cm/s LVOT VTI:    0.218 m  AORTA Ao Root diam: 3.10 cm Ao Asc diam:  3.50 cm MITRAL VALVE MV Area (PHT): 3.19 cm    SHUNTS MV Decel Time: 238 msec    Systemic VTI:  0.22 m MV E velocity: 93.55 cm/s  Systemic Diam: 1.90 cm MV A velocity: 62.60 cm/s MV E/A ratio:  1.49 Arvilla Meres MD Electronically signed by Arvilla Meres MD Signature Date/Time: 01/09/2024/12:40:02 PM    Final    CT ABDOMEN PELVIS W CONTRAST Result Date: 01/08/2024 CLINICAL DATA:  Blunt abdominal trauma EXAM: CT ABDOMEN AND PELVIS WITH CONTRAST TECHNIQUE: Multidetector CT imaging of the abdomen and pelvis was performed using the standard protocol following bolus administration of intravenous contrast. RADIATION DOSE REDUCTION: This exam was performed according to the departmental dose-optimization program which includes automated exposure control, adjustment of the mA and/or kV according to patient size and/or use of iterative reconstruction technique. CONTRAST:  75mL OMNIPAQUE IOHEXOL 350 MG/ML SOLN COMPARISON:  None Available. FINDINGS: Lower chest: No acute abnormality.  Small pericardial effusion Hepatobiliary: Moderate hepatic steatosis. No enhancing intrahepatic mass. No intra or extrahepatic biliary ductal dilation. Gallbladder unremarkable. Pancreas: Unremarkable Spleen: Unremarkable Adrenals/Urinary Tract: The adrenal glands are unremarkable. Adrenal glands are unremarkable. Multiple cortical hypodensities are seen arising from the renal cortices bilaterally, the largest of which are compatible with simple cortical cysts for which no follow-up imaging is recommended. Partially calcified involuted cyst noted arising from the lower pole left kidney for which no follow-up imaging is recommended. The kidneys are otherwise unremarkable. Bladder unremarkable. Stomach/Bowel: Stomach is within normal limits. Appendix appears normal. No evidence of bowel wall thickening, distention, or inflammatory changes. Vascular/Lymphatic: Aortic atherosclerosis. No enlarged abdominal or pelvic lymph nodes. Reproductive: Status post hysterectomy. No adnexal masses. Other: A 10.8 x 11.0 x 4.9 cm (volume = 300 cm^3) hematoma is seen within the left lower quadrant extraperitoneal fat, best seen at axial image # 75/3. No active  extravasation is identified. No abdominal wall hernia. No abdominopelvic ascites or hemoperitoneum. Musculoskeletal: Osseous structures are age-appropriate. No acute bone abnormality IMPRESSION: 1. Large hematoma within the left lower quadrant extraperitoneal fat. No active extravasation identified. 2. Moderate hepatic steatosis. 3. Small pericardial effusion. Aortic Atherosclerosis (ICD10-I70.0). Electronically Signed   By: Helyn Numbers M.D.   On: 01/08/2024 21:54   CT Head Wo Contrast Result Date: 01/08/2024 CLINICAL DATA:  Trauma EXAM: CT HEAD WITHOUT CONTRAST TECHNIQUE: Contiguous axial images were obtained from the base of the  skull through the vertex without intravenous contrast. RADIATION DOSE REDUCTION: This exam was performed according to the departmental dose-optimization program which includes automated exposure control, adjustment of the mA and/or kV according to patient size and/or use of iterative reconstruction technique. COMPARISON:  MRI brain 03/18/2021 FINDINGS: Brain: No evidence of acute infarction, hemorrhage, hydrocephalus, extra-axial collection or mass lesion/mass effect. Vascular: No hyperdense vessel or unexpected calcification. Skull: Normal. Negative for fracture or focal lesion. Sinuses/Orbits: No acute finding. Other: None. IMPRESSION: No acute intracranial abnormality. Electronically Signed   By: Darliss Cheney M.D.   On: 01/08/2024 21:54   CT Cervical Spine Wo Contrast Result Date: 01/08/2024 CLINICAL DATA:  Neck trauma (Age >= 65y) Polytrauma, blunt EXAM: CT CERVICAL SPINE WITHOUT CONTRAST TECHNIQUE: Multidetector CT imaging of the cervical spine was performed without intravenous contrast. Multiplanar CT image reconstructions were also generated. RADIATION DOSE REDUCTION: This exam was performed according to the departmental dose-optimization program which includes automated exposure control, adjustment of the mA and/or kV according to patient size and/or use of iterative  reconstruction technique. COMPARISON:  None Available. FINDINGS: Alignment: Normal. Skull base and vertebrae: Grade cervical alignment is normal. The atlantodental interval is not widened. No acute fracture of the cervical spine. Soft tissues and spinal canal: No prevertebral fluid or swelling. No visible canal hematoma. Disc levels: There is endplate remodeling and disc annular calcifications at C3-C7 in keeping with changes of mild to moderate degenerative disc disease. Prevertebral soft tissues are not thickened sagittal reformats. 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 No high-grade canal stenosis. No significant neuroforaminal narrowing. Upper chest: Negative. Other: None IMPRESSION: 1. No acute fracture or listhesis of the cervical spine. 2. 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 Electronically Signed   By: Helyn Numbers M.D.   On: 01/08/2024 21:44   DG Chest Port 1 View Result Date: 01/08/2024 CLINICAL DATA:  Fall and new onset atrial fibrillation EXAM: PORTABLE CHEST 1 VIEW COMPARISON:  Chest x-ray 04/21/2017 FINDINGS: The heart and mediastinal contours are within normal limits. Interval development of lower left lung zone 1.6 cm and mid left lung zone 1.8 cm nodular density. No pulmonary edema. No pleural effusion. No pneumothorax. No acute osseous abnormality. IMPRESSION: Interval development of lower left lung zone 1.6 cm and mid left lung zone 1.8 cm nodular density. Followup PA and lateral chest X-ray is recommended in 3-4 weeks following therapy to ensure resolution and exclude underlying malignancy. Electronically Signed   By: Tish Frederickson M.D.   On: 01/08/2024 20:04   Scheduled Meds:  atorvastatin  20 mg Oral QHS   diltiazem  120 mg Oral Daily   estradiol  1 mg Oral Daily   ezetimibe  10 mg Oral Daily   fenofibrate  160 mg Oral Daily   FLUoxetine  60 mg Oral Daily    insulin aspart  0-15 Units Subcutaneous TID WC   insulin glargine  18 Units Subcutaneous QHS   pantoprazole  40 mg Oral Daily   Continuous Infusions:  sodium chloride      LOS: 1 day   Marguerita Merles, DO [facility] Available via Epic secure chat 7am-7pm After these hours, please refer to coverage provider listed on [email or web address] 01/10/2024, 6:22 PM

## 2024-01-10 NOTE — Plan of Care (Signed)

## 2024-01-11 DIAGNOSIS — I4891 Unspecified atrial fibrillation: Secondary | ICD-10-CM | POA: Diagnosis not present

## 2024-01-11 LAB — GLUCOSE, CAPILLARY
Glucose-Capillary: 234 mg/dL — ABNORMAL HIGH (ref 70–99)
Glucose-Capillary: 379 mg/dL — ABNORMAL HIGH (ref 70–99)
Glucose-Capillary: 284 mg/dL — ABNORMAL HIGH (ref 70–99)
Glucose-Capillary: 261 mg/dL — ABNORMAL HIGH (ref 70–99)

## 2024-01-11 LAB — CBC WITH DIFFERENTIAL/PLATELET
Abs Immature Granulocytes: 0.05 10*3/uL (ref 0.00–0.07)
Abs Immature Granulocytes: 0.05 10*3/uL (ref 0.00–0.07)
Basophils Absolute: 0 10*3/uL (ref 0.0–0.1)
Basophils Absolute: 0 10*3/uL (ref 0.0–0.1)
Basophils Relative: 0 %
Basophils Relative: 0 %
Eosinophils Absolute: 0.2 10*3/uL (ref 0.0–0.5)
Eosinophils Absolute: 0.1 10*3/uL (ref 0.0–0.5)
Eosinophils Relative: 3 %
Eosinophils Relative: 2 %
HCT: 29 % — ABNORMAL LOW (ref 36.0–46.0)
HCT: 30.8 % — ABNORMAL LOW (ref 36.0–46.0)
Hemoglobin: 9.1 g/dL — ABNORMAL LOW (ref 12.0–15.0)
Hemoglobin: 9.8 g/dL — ABNORMAL LOW (ref 12.0–15.0)
Immature Granulocytes: 1 %
Immature Granulocytes: 1 %
Lymphocytes Relative: 27 %
Lymphocytes Relative: 24 %
Lymphs Abs: 2 10*3/uL (ref 0.7–4.0)
Lymphs Abs: 1.5 10*3/uL (ref 0.7–4.0)
MCH: 27 pg (ref 26.0–34.0)
MCH: 27.6 pg (ref 26.0–34.0)
MCHC: 31.4 g/dL (ref 30.0–36.0)
MCHC: 31.8 g/dL (ref 30.0–36.0)
MCV: 86.1 fL (ref 80.0–100.0)
MCV: 86.8 fL (ref 80.0–100.0)
Monocytes Absolute: 0.6 10*3/uL (ref 0.1–1.0)
Monocytes Absolute: 0.5 10*3/uL (ref 0.1–1.0)
Monocytes Relative: 8 %
Monocytes Relative: 8 %
Neutro Abs: 4.4 10*3/uL (ref 1.7–7.7)
Neutro Abs: 3.9 10*3/uL (ref 1.7–7.7)
Neutrophils Relative %: 61 %
Neutrophils Relative %: 65 %
Platelets: 150 10*3/uL (ref 150–400)
Platelets: 151 10*3/uL (ref 150–400)
RBC: 3.37 MIL/uL — ABNORMAL LOW (ref 3.87–5.11)
RBC: 3.55 MIL/uL — ABNORMAL LOW (ref 3.87–5.11)
RDW: 15.1 % (ref 11.5–15.5)
RDW: 14.9 % (ref 11.5–15.5)
WBC: 7.2 10*3/uL (ref 4.0–10.5)
WBC: 6.1 10*3/uL (ref 4.0–10.5)
nRBC: 0 % (ref 0.0–0.2)
nRBC: 0 % (ref 0.0–0.2)

## 2024-01-11 LAB — COMPREHENSIVE METABOLIC PANEL
ALT: 11 U/L (ref 0–44)
AST: 11 U/L — ABNORMAL LOW (ref 15–41)
Albumin: 2.5 g/dL — ABNORMAL LOW (ref 3.5–5.0)
Alkaline Phosphatase: 21 U/L — ABNORMAL LOW (ref 38–126)
Anion gap: 8 (ref 5–15)
BUN: 35 mg/dL — ABNORMAL HIGH (ref 8–23)
CO2: 23 mmol/L (ref 22–32)
Calcium: 8.4 mg/dL — ABNORMAL LOW (ref 8.9–10.3)
Chloride: 107 mmol/L (ref 98–111)
Creatinine, Ser: 1.4 mg/dL — ABNORMAL HIGH (ref 0.44–1.00)
GFR, Estimated: 40 mL/min — ABNORMAL LOW (ref >60)
Glucose, Bld: 244 mg/dL — ABNORMAL HIGH (ref 70–99)
Potassium: 4.8 mmol/L (ref 3.5–5.1)
Sodium: 138 mmol/L (ref 135–145)
Total Bilirubin: 0.6 mg/dL (ref 0.0–1.2)
Total Protein: 4.6 g/dL — ABNORMAL LOW (ref 6.5–8.1)

## 2024-01-11 LAB — MAGNESIUM: Magnesium: 2.3 mg/dL (ref 1.7–2.4)

## 2024-01-11 LAB — PHOSPHORUS: Phosphorus: 4 mg/dL (ref 2.5–4.6)

## 2024-01-11 MED ORDER — INSULIN GLARGINE 100 UNIT/ML ~~LOC~~ SOLN
28.0000 [IU] | Freq: Every day | SUBCUTANEOUS | Status: DC
  Administered 2024-01-11: 28 [IU] via SUBCUTANEOUS
  Filled 2024-01-11 (×2): qty 0.28

## 2024-01-11 NOTE — Progress Notes (Signed)
 PROGRESS NOTE    Andrea Marks  WUJ:[id number] DOB: 1951-01-26 DOA: 01/08/2024 PCP: Donato Schultz, DO   Brief Narrative:  The patient is a elderly 73 year old Caucasian female PMH significant for DMT2, HLD, B12 deficiency, severe obesity, HTN and other comorbidities presented the ED with a mechanical fall. Further workup was done and she was found to have a very large left lower quadrant hematoma on imaging and was also found to be in A-fib with RVR when she presented with associated dizziness and hypotension. Cardiology was consulted and was placed on a diltiazem drip and she spontaneously converted to normal sinus rhythm. A decision was made not to anticoagulate her due to the large hematoma at this time. PT OT recommending home health when stable for D/C but hospitalization has been complicated by an AKI which is worsening, as well as acute blood loss anemia in the setting of significant hematoma as her blood count continues to decrease as well.   Assessment & Plan:   Principal Problem:   Atrial fibrillation, rapid (HCC) Active Problems:   Atrial fibrillation (HCC)   Persistent atrial fibrillation (HCC)  New onset A Fib with RVR now s/p Conversion to NSR: Cardiology consulted and now signed off.  TSH was 3.020 and T4 was 0.92.  Placed on diltiazem gtt but then converted to NSR and transitioned to oral Diltiazem 120 mg p.o. daily w/ Cardiology Follow up in the Future. -Continue holding on anticoagulation until hematoma resolves with outpatient discussion with Cardiology/PCP. -Needs A Fib Clinic Referral and Cards arrange a 2-week monitor to assess A-fib burden and recurrence   Significant Hematoma / ABLA: CT Abd/Pelvis showed a large hematoma ((10.8 x 11.0 x 4.9 cm (volume = 300 cm^3)) within the left lower quadrant extraperitoneal fat with no active extravasation. Holding anticoagulation until hematoma resolves and shared decision in outpatient F/U w/ Cardiology. Currently  holding ASA. Hgb/Hct Trending down and went from 12.6 upon admission down to 9.1 today.  Will check CBC every 12 and if evidence of further drop, will obtain another CT scan.   AKI: Normal Baseline Cr PTA. BUN/Cr.  Presented with 1.17, started on hydration, peaked at 1.69 on 01/10/2024, now improving and down to 1.4.  Continue IV fluids which will end at 7 PM today.  Repeat labs in the morning.  Avoid nephrotoxic agents.  Renal ultrasound unremarkable.   Lung nodules: Noted to have Interval development of LLL zone 1.6 cm and Mid Lef lung zone 1.8 cm nodular density. Repeat PA and lateral CXR in 3-4 weeks and may need further imaging and evaluation after repeat to exclude Malignancy  Generalized Weakness: Has poor mobility, uses cane and walker at home. PT consulted and  recommending HHPT C/w IVF Hydration as above   HLD: C/w Atorvastatin 20 mg po Daily, Ezetimibe 10 mg po Daily and Fenofibrate 160 mg po Daily   Anxiety and Depression: C/w Fluoxetine 60 mg po Daily   Essential HTN: C/w Diltiazem 120 mg po Daily. CTM BP per Protcol. Last BP reading was    DM Type 2 w/ Hyperglycemia: HbA1c is 7.8 Hold Metformin and Ozempic; she is currently on Semglee 18 units but still hyperglycemic, will increase to 28 units tonight and continue SSI.     GERD/GI Prophylaxis: C/w PPI w/ Pantoprazole 40 mg po Daily   HRT: C/w Estradiol 1 mg po Daily    HypoMag -> HyperMag: Resolved.   Hypoalbuminemia: Albumin dropped to 2.5. CTM and Trend and repeat CMP in the  AM   Class III (Super Morbid) Obesity: Complicates overall prognosis and care. Estimated body mass index is 50.85 kg/m. Weight loss and diet modification counseled.  DVT prophylaxis: SCDs Start: 01/09/24 0435   Code Status: Limited: Do not attempt resuscitation (DNR) -DNR-LIMITED -Do Not Intubate/DNI   Family Communication:  None present at bedside.  Plan of care discussed with patient in length and he/she verbalized understanding and agreed with  it.  Status is: Inpatient Remains inpatient appropriate because: Slowly dropping hemoglobin, needs more observation and needs fluids for AKI.   Estimated body mass index is 50.85 kg/m as calculated from the following:   Height as of this encounter: 5\' 2"  (1.575 m).   Weight as of this encounter: 126.1 kg.    Nutritional Assessment: Body mass index is 50.85 kg/m.Marland Kitchen Seen by dietician.  I agree with the assessment and plan as outlined below: Nutrition Status:        . Skin Assessment: I have examined the patient's skin and I agree with the wound assessment as performed by the wound care RN as outlined below:    Consultants:  None  Procedures:  None  Antimicrobials:  Anti-infectives (From admission, onward)    None         Subjective: Patient seen and examined.  Still complains of the flank pain where she has the bruise.  She is very tender to touch over there.  No other complaint.  Objective: Vitals:   01/10/24 2312 01/11/24 0320 01/11/24 0809 01/11/24 1200  BP: (!) 128/51 (!) 130/47 (!) 141/51 127/78  Pulse: 64 66 63 71  Resp: 18 16 18    Temp: 98.5 F (36.9 C) 98.4 F (36.9 C) 98.3 F (36.8 C) 98.3 F (36.8 C)  TempSrc: Oral Oral Oral Oral  SpO2: 90% 99% 93% 96%  Weight:      Height:        Intake/Output Summary (Last 24 hours) at 01/11/2024 1300 Last data filed at 01/11/2024 0701 Gross per 24 hour  Intake 1016.93 ml  Output 300 ml  Net 716.93 ml   Filed Weights   01/08/24 1857  Weight: 126.1 kg    Examination:  General exam: Appears calm and comfortable, morbidly obese Respiratory system: Clear to auscultation. Respiratory effort normal. Cardiovascular system: S1 & S2 heard, RRR. No JVD, murmurs, rubs, gallops or clicks. No pedal edema. Gastrointestinal system: Abdomen is nondistended, soft and nontender. No organomegaly or masses felt. Normal bowel sounds heard. Central nervous system: Alert and oriented. No focal neurological  deficits. Extremities: Symmetric 5 x 5 power. Skin: No rashes, lesions or ulcers.  Large bruise at the left flank area which is tender to palpation Psychiatry: Judgement and insight appear normal. Mood & affect appropriate.    Data Reviewed: I have personally reviewed following labs and imaging studies  CBC: Recent Labs  Lab 01/08/24 1950 01/09/24 0031 01/09/24 1304 01/10/24 0340 01/11/24 0310  WBC 14.7*  --  10.2 9.2 7.2  NEUTROABS  --   --  7.1 5.5 4.4  HGB 12.6 12.2 11.4* 10.1* 9.1*  HCT 40.5 39.6 36.7 32.9* 29.0*  MCV 86.9  --  88.2 86.8 86.1  PLT 251  --  204 164 150   Basic Metabolic Panel: Recent Labs  Lab 01/08/24 1950 01/09/24 0500 01/09/24 1304 01/10/24 0340 01/11/24 0310  NA 137  --  140 138 138  K 4.2  --  4.8 4.7 4.8  CL 106  --  105 109 107  CO2 23  --  25 25 23   GLUCOSE 365*  --  225* 205* 244*  BUN 20  --  27* 31* 35*  CREATININE 1.17*  --  1.50* 1.69* 1.40*  CALCIUM 7.9*  --  8.4* 8.0* 8.4*  MG  --  1.4*  --  2.6* 2.3  PHOS  --   --  3.9 4.9* 4.0   GFR: Estimated Creatinine Clearance: 46.2 mL/min (A) (by C-G formula based on SCr of 1.4 mg/dL (H)). Liver Function Tests: Recent Labs  Lab 01/09/24 1304 01/10/24 0340 01/11/24 0310  AST 17 16 11*  ALT 13 13 11   ALKPHOS 21* 21* 21*  BILITOT 0.6 0.6 0.6  PROT 5.0* 4.5* 4.6*  ALBUMIN 2.8* 2.5* 2.5*   No results for input(s): "LIPASE", "AMYLASE" in the last 168 hours. No results for input(s): "AMMONIA" in the last 168 hours. Coagulation Profile: No results for input(s): "INR", "PROTIME" in the last 168 hours. Cardiac Enzymes: No results for input(s): "CKTOTAL", "CKMB", "CKMBINDEX", "TROPONINI" in the last 168 hours. BNP (last 3 results) No results for input(s): "PROBNP" in the last 8760 hours. HbA1C: Recent Labs    01/09/24 0500  HGBA1C 7.8*   CBG: Recent Labs  Lab 01/10/24 1200 01/10/24 1840 01/10/24 2126 01/11/24 0620 01/11/24 1235  GLUCAP 373* 286* 307* 234* 379*   Lipid  Profile: No results for input(s): "CHOL", "HDL", "LDLCALC", "TRIG", "CHOLHDL", "LDLDIRECT" in the last 72 hours. Thyroid Function Tests: Recent Labs    01/09/24 0500  TSH 3.020  FREET4 0.92   Anemia Panel: No results for input(s): "VITAMINB12", "FOLATE", "FERRITIN", "TIBC", "IRON", "RETICCTPCT" in the last 72 hours. Sepsis Labs: Recent Labs  Lab 01/08/24 2141 01/09/24 0037 01/09/24 0500  LATICACIDVEN 2.1* 2.0* 1.9    Recent Results (from the past 240 hours)  Culture, blood (Routine X 2) w Reflex to ID Panel     Status: None (Preliminary result)   Collection Time: 01/08/24  9:45 PM   Specimen: BLOOD  Result Value Ref Range Status   Specimen Description BLOOD LEFT ANTECUBITAL  Final   Special Requests   Final    BOTTLES DRAWN AEROBIC AND ANAEROBIC Blood Culture adequate volume   Culture   Final    NO GROWTH 3 DAYS Performed at [facility], [address] [place], [place] [address]    Report Status PENDING  Incomplete  Culture, blood (Routine X 2) w Reflex to ID Panel     Status: None (Preliminary result)   Collection Time: 01/08/24  9:45 PM   Specimen: BLOOD LEFT HAND  Result Value Ref Range Status   Specimen Description BLOOD LEFT HAND  Final   Special Requests   Final    BOTTLES DRAWN AEROBIC AND ANAEROBIC Blood Culture results may not be optimal due to an inadequate volume of blood received in culture bottles   Culture   Final    NO GROWTH 3 DAYS Performed at [facility], [address] [place], [place] [address]    Report Status PENDING  Incomplete     Radiology Studies: US RENAL Result Date: 01/11/2024 CLINICAL DATA:  Acute kidney injury EXAM: RENAL / URINARY TRACT ULTRASOUND COMPLETE COMPARISON:  CT abdomen and pelvis 01/08/2024 FINDINGS: Right Kidney: Renal measurements: 11.2 by 5.2 x 5.5 cm = volume: 160 mL. Echogenicity within normal limits. No mass or hydronephrosis visualized. Left Kidney: Renal measurements: 10.7 x 6.2 x 4.4 cm = volume: 151  mL. Echogenicity within normal limits. There is no hydronephrosis. There is a  cyst in the superior pole measuring 4.1 x 3.1 x 3.6 cm. Bladder: Not well seen. Other: None. IMPRESSION: 1. No hydronephrosis. 2. Left renal cyst. Electronically Signed   By: Darliss Cheney M.D.   On: 01/11/2024 00:07    Scheduled Meds:  atorvastatin  20 mg Oral QHS   diltiazem  120 mg Oral Daily   estradiol  1 mg Oral Daily   ezetimibe  10 mg Oral Daily   fenofibrate  160 mg Oral Daily   FLUoxetine  60 mg Oral Daily   insulin aspart  0-15 Units Subcutaneous TID WC   insulin glargine  28 Units Subcutaneous QHS   pantoprazole  40 mg Oral Daily   Continuous Infusions:  sodium chloride 75 mL/hr at 01/10/24 2011     LOS: 2 days   Hughie Closs, MD [facility]  01/11/2024, 1:00 PM   *Please note that this is a verbal dictation therefore any spelling or grammatical errors are due to the "Dragon Medical One" system interpretation.  Please page via Amion and do not message via secure chat for urgent patient care matters. Secure chat can be used for non urgent patient care matters.  How to contact the [facility] Attending or Consulting provider 7A - 7P or covering provider during after hours 7P -7A, for this patient?  Check the care team in [facility] and look for a) attending/consulting [facility] provider listed and b) the [facility] team listed. Page or secure chat 7A-7P. Log into [email or web address] and use [place]'s universal password to access. If you do not have the password, please contact the hospital operator. Locate the [facility] provider you are looking for under [facility] and page to a number that you can be directly reached. If you still have difficulty reaching the provider, please page the [facility] (Director on Call) for the Hospitalists listed on amion for assistance.

## 2024-01-11 NOTE — Progress Notes (Signed)
 Mobility Specialist Progress Note:    01/11/24 1003  Mobility  Activity Ambulated with assistance to bathroom;Dangled on edge of bed  Level of Assistance Minimal assist, patient does 75% or more  Assistive Device Front wheel walker  Distance Ambulated (ft) 24 ft  Activity Response Tolerated well  Mobility Referral Yes  Mobility visit 1 Mobility  Mobility Specialist Start Time (ACUTE ONLY) 1002  Mobility Specialist Stop Time (ACUTE ONLY) 1009  Mobility Specialist Time Calculation (min) (ACUTE ONLY) 7 min   Pt received in bathroom, assisted pt back to EOB. MinA required to stand from commode via RW. Tolerated well, asx throughout. Pt left sitting EOB with all needs met, call bell and phone in reach.   Feliciana Rossetti Mobility Specialist Please contact via Special educational needs teacher or  Rehab office at [phone]

## 2024-01-11 NOTE — Progress Notes (Signed)
 Physical Therapy Treatment Patient Details Name: Andrea Marks MRN: [id number] DOB: 24-Jun-1951 Today's Date: 01/11/2024   History of Present Illness Pt is 73 yo female who presents on 01/08/24 after a fall in her kitchen followed by dizziness and found to be in Afib with RVR. Pt hypotensive in ED. CT head neg. CT abdomen showed large hematoma LLQ. PMH: DM, GERD, HTN, HLD    PT Comments  Pt resting in bed on arrival and agreeable to session with continued progress towards acute goals. Pt demonstrating bed mobility, transfers and gait with grossly CGA for safety with rollator for support. Pt able to progress stair training this session without fault with good recall for sequencing. Pt verbalizing energy conservation strategies she already implements and educated provided on fall prevention strategies with pt verbalizing understanding. Pt continues to benefit from skilled PT services to progress toward functional mobility goals.      If plan is discharge home, recommend the following: A little help with bathing/dressing/bathroom;Assistance with cooking/housework;Help with stairs or ramp for entrance;Assist for transportation   Can travel by private vehicle        Equipment Recommendations  None recommended by PT    Recommendations for Other Services       Precautions / Restrictions Precautions Precautions: Fall Recall of Precautions/Restrictions: Intact Restrictions Weight Bearing Restrictions Per Provider Order: No     Mobility  Bed Mobility Overal bed mobility: Needs Assistance Bed Mobility: Rolling, Sidelying to Sit Rolling: Contact guard assist Sidelying to sit: Contact guard assist, HOB elevated, Used rails       General bed mobility comments: increased time and use pf bed features, no assist needed    Transfers Overall transfer level: Needs assistance Equipment used: 1 person hand held assist, None, Rollator (4 wheels) Transfers: Sit to/from Stand, Bed to  chair/wheelchair/BSC Sit to Stand: Contact guard assist   Step pivot transfers: Min assist       General transfer comment: light min A to step pivot to [facility] with HHA to steady, able to rise from EOB and BSC with CGA without UE support    Ambulation/Gait Ambulation/Gait assistance: Contact guard assist, Supervision Gait Distance (Feet): 275 Feet Assistive device: Rollator (4 wheels) Gait Pattern/deviations: Step-through pattern Gait velocity: reduced     General Gait Details: slowed step-through gait, redued stride length   Stairs Stairs: Yes Stairs assistance: Contact guard assist Stair Management: Two rails, Step to pattern, Forwards Number of Stairs: 1 General stair comments: up/down without fault   Wheelchair Mobility     Tilt Bed    Modified Rankin (Stroke Patients Only)       Balance Overall balance assessment: Needs assistance Sitting-balance support: Feet supported, No upper extremity supported Sitting balance-Leahy Scale: Good     Standing balance support: Bilateral upper extremity supported, Reliant on assistive device for balance Standing balance-Leahy Scale: Poor Standing balance comment: reliant on UE support for dynamic activity                            Communication Communication Communication: No apparent difficulties  Cognition Arousal: Alert Behavior During Therapy: WFL for tasks assessed/performed   PT - Cognitive impairments: No apparent impairments                         Following commands: Intact      Cueing Cueing Techniques: Verbal cues  Exercises      General Comments General  comments (skin integrity, edema, etc.): VSS on RA, HR 70bpm at rest up to 100bpm with activity, pt demonstrating good knowledge of energy conservation techniques, reviwed fall prevention strategies      Pertinent Vitals/Pain Pain Assessment Pain Assessment: Faces Faces Pain Scale: Hurts a little bit Pain Location: L  flank Pain Descriptors / Indicators: Sore Pain Intervention(s): Monitored during session, Limited activity within patient's tolerance    Home Living                          Prior Function            PT Goals (current goals can now be found in the care plan section) Acute Rehab PT Goals Patient Stated Goal: return home PT Goal Formulation: With patient Time For Goal Achievement: 01/23/24 Progress towards PT goals: Progressing toward goals    Frequency    Min 2X/week      PT Plan      Co-evaluation              AM-PAC PT "6 Clicks" Mobility   Outcome Measure  Help needed turning from your back to your side while in a flat bed without using bedrails?: A Little Help needed moving from lying on your back to sitting on the side of a flat bed without using bedrails?: A Little Help needed moving to and from a bed to a chair (including a wheelchair)?: A Little Help needed standing up from a chair using your arms (e.g., wheelchair or bedside chair)?: A Little Help needed to walk in hospital room?: A Little Help needed climbing 3-5 steps with a railing? : A Little 6 Click Score: 18    End of Session   Activity Tolerance: Patient tolerated treatment well Patient left: in bed;with call bell/phone within reach (seated up EOB) Nurse Communication: Mobility status PT Visit Diagnosis: Muscle weakness (generalized) (M62.81);Pain Pain - Right/Left: Left Pain - part of body:  (L flank at hematoma site)     Time: 5366-4403 PT Time Calculation (min) (ACUTE ONLY): 24 min  Charges:    $Gait Training: 23-37 mins PT General Charges $$ ACUTE PT VISIT: 1 Visit                     Eldridge Marcott R. PTA Acute Rehabilitation Services Office: [phone]   Catalina Antigua 01/11/2024, 9:54 AM

## 2024-01-11 NOTE — Plan of Care (Signed)
  Problem: Coping: Goal: Ability to adjust to condition or change in health will improve Outcome: Progressing   Problem: Health Behavior/Discharge Planning: Goal: Ability to identify and utilize available resources and services will improve Outcome: Progressing Goal: Ability to manage health-related needs will improve Outcome: Progressing   Problem: Metabolic: Goal: Ability to maintain appropriate glucose levels will improve Outcome: Progressing

## 2024-01-11 NOTE — Inpatient Diabetes Management (Signed)
 Inpatient Diabetes Program Recommendations  AACE/ADA: New Consensus Statement on Inpatient Glycemic Control   Target Ranges:  Prepandial:   less than 140 mg/dL      Peak postprandial:   less than 180 mg/dL (1-2 hours)      Critically ill patients:  140 - 180 mg/dL    Latest Reference Range & Units 01/10/24 06:34 01/10/24 12:00 01/10/24 18:40 01/10/24 21:26 01/11/24 06:20 01/11/24 12:35  Glucose-Capillary 70 - 99 mg/dL 045 (H) 409 (H) 811 (H) 307 (H) 234 (H) 379 (H)   Review of Glycemic Control  Diabetes history: DM2 Outpatient Diabetes medications: Lantus 18 units at bedtime, Novolog 12-15 units TID, Metformin 850 mg QAM, Metformin 1700 mg QPM, Ozempic 2 mg Qweek Current orders for Inpatient glycemic control: Lantus 28 units at bedtime, Novolog 0-15 units TID with meals  Inpatient Diabetes Program Recommendations:    Insulin: Noted Lantus increased from 18 to 28 units at bedtime today. Would recommend to decrease Lantus to 24 units at bedtime and adding Novolog 6 units TID with meals for meal coverage if patient eats at least 50% of meals.  Thanks, Orlando Penner, RN, MSN, CDCES Diabetes Coordinator Inpatient Diabetes Program [phone] (Team Pager from 8am to 5pm)

## 2024-01-12 ENCOUNTER — Other Ambulatory Visit (HOSPITAL_COMMUNITY): Payer: Self-pay

## 2024-01-12 DIAGNOSIS — I4891 Unspecified atrial fibrillation: Secondary | ICD-10-CM | POA: Diagnosis not present

## 2024-01-12 LAB — BASIC METABOLIC PANEL
Anion gap: 4 — ABNORMAL LOW (ref 5–15)
BUN: 21 mg/dL (ref 8–23)
CO2: 25 mmol/L (ref 22–32)
Calcium: 8.5 mg/dL — ABNORMAL LOW (ref 8.9–10.3)
Chloride: 108 mmol/L (ref 98–111)
Creatinine, Ser: 0.87 mg/dL (ref 0.44–1.00)
GFR, Estimated: >60 mL/min (ref >60)
Glucose, Bld: 283 mg/dL — ABNORMAL HIGH (ref 70–99)
Potassium: 4.4 mmol/L (ref 3.5–5.1)
Sodium: 137 mmol/L (ref 135–145)

## 2024-01-12 LAB — CBC WITH DIFFERENTIAL/PLATELET
Abs Immature Granulocytes: 0.05 10*3/uL (ref 0.00–0.07)
Basophils Absolute: 0 10*3/uL (ref 0.0–0.1)
Basophils Relative: 1 %
Eosinophils Absolute: 0.1 10*3/uL (ref 0.0–0.5)
Eosinophils Relative: 2 %
HCT: 33.6 % — ABNORMAL LOW (ref 36.0–46.0)
Hemoglobin: 10.6 g/dL — ABNORMAL LOW (ref 12.0–15.0)
Immature Granulocytes: 1 %
Lymphocytes Relative: 20 %
Lymphs Abs: 1.2 10*3/uL (ref 0.7–4.0)
MCH: 27.2 pg (ref 26.0–34.0)
MCHC: 31.5 g/dL (ref 30.0–36.0)
MCV: 86.4 fL (ref 80.0–100.0)
Monocytes Absolute: 0.4 10*3/uL (ref 0.1–1.0)
Monocytes Relative: 7 %
Neutro Abs: 4 10*3/uL (ref 1.7–7.7)
Neutrophils Relative %: 69 %
Platelets: 161 10*3/uL (ref 150–400)
RBC: 3.89 MIL/uL (ref 3.87–5.11)
RDW: 15 % (ref 11.5–15.5)
WBC: 5.8 10*3/uL (ref 4.0–10.5)
nRBC: 0 % (ref 0.0–0.2)

## 2024-01-12 LAB — GLUCOSE, CAPILLARY
Glucose-Capillary: 244 mg/dL — ABNORMAL HIGH (ref 70–99)
Glucose-Capillary: 364 mg/dL — ABNORMAL HIGH (ref 70–99)

## 2024-01-12 MED ORDER — DILTIAZEM HCL ER COATED BEADS 120 MG PO CP24
120.0000 mg | ORAL_CAPSULE | Freq: Every day | ORAL | 0 refills | Status: DC
  Filled 2024-01-12: qty 30, 30d supply, fill #0

## 2024-01-12 MED ORDER — EZETIMIBE 10 MG PO TABS
10.0000 mg | ORAL_TABLET | Freq: Every day | ORAL | 0 refills | Status: DC
  Filled 2024-01-12: qty 30, 30d supply, fill #0

## 2024-01-12 MED ORDER — INSULIN ASPART 100 UNIT/ML IJ SOLN
5.0000 [IU] | Freq: Three times a day (TID) | INTRAMUSCULAR | Status: DC
  Administered 2024-01-12: 5 [IU] via SUBCUTANEOUS

## 2024-01-12 MED ORDER — BENAZEPRIL HCL 20 MG PO TABS: 20.0000 mg | ORAL_TABLET | Freq: Every day | ORAL | Status: DC

## 2024-01-12 MED ORDER — ATORVASTATIN CALCIUM 20 MG PO TABS
20.0000 mg | ORAL_TABLET | Freq: Every day | ORAL | 0 refills | Status: DC
  Filled 2024-01-12: qty 30, 30d supply, fill #0

## 2024-01-12 MED ORDER — LANTUS SOLOSTAR 100 UNIT/ML ~~LOC~~ SOPN: 30.0000 [IU] | PEN_INJECTOR | Freq: Every day | SUBCUTANEOUS | Status: DC

## 2024-01-12 MED ORDER — METFORMIN HCL 850 MG PO TABS: ORAL_TABLET | ORAL | Status: DC

## 2024-01-12 NOTE — Progress Notes (Signed)
 Mobility Specialist Progress Note:    01/12/24 0950  Mobility  Activity Ambulated with assistance to bathroom;Transferred from bed to chair  Level of Assistance Standby assist, set-up cues, supervision of patient - no hands on  Assistive Device Front wheel walker  Distance Ambulated (ft) 24 ft  Activity Response Tolerated well  Mobility Referral Yes  Mobility visit 1 Mobility  Mobility Specialist Start Time (ACUTE ONLY) 0950  Mobility Specialist Stop Time (ACUTE ONLY) 1000  Mobility Specialist Time Calculation (min) (ACUTE ONLY) 10 min   Pt received in bed, requesting to use bathroom. SBA throughout session with RW. Void successful. Tolerated well, ambulated around bed to chair. When returning to chair, Max HR in 140's. RN in room. Pt sitting up comfortably with all needs met, family at bedside.   Feliciana Rossetti Mobility Specialist Please contact via Special educational needs teacher or  Rehab office at [phone]

## 2024-01-12 NOTE — Progress Notes (Signed)
 [facility] by bedside, pt had received Zio patch in the mail, and its with her at bedside. Pt had questions, pt did discuss with cardiology about the Zio patch. Got in Touch with cardiology PA H. Cephus Shelling, instructed to place it at home. Instructed pt to follow direction in the box and call cardiology office regarding any questionnaire. Plan of care continues.

## 2024-01-12 NOTE — Discharge Summary (Signed)
 Physician Discharge Summary  Andrea Marks ZOX:[id number] DOB: 02-03-1951 DOA: 01/08/2024  PCP: Donato Schultz, DO  Admit date: 01/08/2024 Discharge date: 01/12/2024 30 Day Unplanned Readmission Risk Score    Flowsheet Row ED to Hosp-Admission (Current) from 01/08/2024 in [facility] 4E CV SURGICAL PROGRESSIVE CARE  30 Day Unplanned Readmission Risk Score (%) 11.76 Filed at 01/12/2024 1200       This score is the patient's risk of an unplanned readmission within 30 days of being discharged (0 -100%). The score is based on dignosis, age, lab data, medications, orders, and past utilization.   Low:  0-14.9   Medium: 15-21.9   High: 22-29.9   Extreme: 30 and above          Admitted From: Home Disposition: Home  Recommendations for Outpatient Follow-up:  Follow up with PCP in 1-2 weeks Please obtain BMP/CBC in one week Follow-up with cardiologist in 2 weeks Please follow up with your PCP on the following pending results: Unresulted Labs (From admission, onward)     Start     Ordered   01/09/24 1416  Osmolality, urine  Once,   R        01/09/24 1415   01/09/24 1415  Sodium, urine, random  Once,   R        01/09/24 1415   01/09/24 1415  Creatinine, urine, random  Once,   R        01/09/24 1415              Home Health: Yes Equipment/Devices: None  Discharge Condition: Stable CODE STATUS: DNR Diet recommendation: Cardiac  Subjective: Seen and examined.  No complaints.  Flank pain is improving.  Discussed plan of discharge with her and she is in agreement.  Brief/Interim Summary: The patient is a elderly 73 year old Caucasian female PMH significant for DMT2, HLD, B12 deficiency, severe obesity, HTN and other comorbidities presented the ED with a mechanical fall. Further workup was done and she was found to have a very large left lower quadrant hematoma on imaging and was also found to be in A-fib with RVR when she presented with associated dizziness and  hypotension. Cardiology was consulted and was placed on a diltiazem drip and she spontaneously converted to normal sinus rhythm. A decision was made not to anticoagulate her due to the large hematoma at this time. PT OT recommending home health, details of hospitalization as below.  New onset A Fib with RVR now s/p Conversion to NSR: Cardiology consulted and now signed off.  TSH was 3.020 and T4 was 0.92.  Placed on diltiazem gtt but then converted to NSR and transitioned to oral Diltiazem 120 mg p.o. daily w/ Cardiology Follow up in the Future. -Continue holding on anticoagulation until hematoma resolves with outpatient discussion with Cardiology/PCP. -Needs A Fib Clinic Referral which cards will arrange, cards has also mailed her 2-week heart monitor at home which she has already received.  She is fully aware of the instructions.   Significant Hematoma / ABLA: CT Abd/Pelvis showed a large hematoma ((10.8 x 11.0 x 4.9 cm (volume = 300 cm^3)) within the left lower quadrant extraperitoneal fat with no active extravasation. Holding anticoagulation until hematoma resolves and shared decision in outpatient F/U w/ Cardiology. Currently holding ASA. Hgb/Hct Trending down and went from 12.6 upon admission down to 9.1 yesterday but since then has improved and 10.6 today without any transfusion.  Based on this, we can be reassured that she is not bleeding anymore.  AKI: Normal Baseline Cr PTA. BUN/Cr.  Presented with 1.17, started on hydration, peaked at 1.69 on 01/10/2024, improved and normal today.  She is advised to resume her losartan tomorrow and metformin 2 days.   Lung nodules: Noted to have Interval development of LLL zone 1.6 cm and Mid Lef lung zone 1.8 cm nodular density. Repeat PA and lateral CXR in 3-4 weeks and may need further imaging and evaluation after repeat to exclude Malignancy.  Will defer to PCP to arrange that.  Generalized Weakness: Has poor mobility, uses cane and walker at home. PT  consulted and  recommending HHPT C/w IVF Hydration as above   HLD: C/w Atorvastatin 20 mg po Daily, Ezetimibe 10 mg po Daily and Fenofibrate 160 mg po Daily   Anxiety and Depression: C/w Fluoxetine 60 mg po Daily   Essential HTN: C/w Diltiazem 120 mg po Daily.  Resume losartan tomorrow.   DM Type 2 w/ Hyperglycemia: HbA1c is 7.8 she is on metformin and Ozempic PTA; she is advised to take Lantus 28 units nightly at home but she was only using 18 units.  Here we had to bump up to 28 and yet she is hyperglycemic so I have discharged her on 30 units of Lantus nightly.   HypoMag -> HyperMag: Resolved.   Class III (Super Morbid) Obesity: Complicates overall prognosis and care. Estimated body mass index is 50.85 kg/m. Weight loss and diet modification counseled.  Discharge Diagnoses:  Principal Problem:   Atrial fibrillation, rapid [facility]) Active Problems:   Atrial fibrillation (HCC)   Persistent atrial fibrillation (HCC)    Discharge Instructions   Allergies as of 01/12/2024       Reactions   Apple Other (See Comments)   Unable to digest the apple peel.   Hydrocodone-acetaminophen Other (See Comments)   unknown   Oxycodone Hcl Other (See Comments)   unknown   Penicillins Other (See Comments)   unknown   Prednisone    Patient has noted allergy to prednisone but she says it was a questionable history of mild nausea with prednisone and nothing more        Medication List     TAKE these medications    acetaminophen 650 MG CR tablet Commonly known as: TYLENOL Take 1,300 mg by mouth 2 (two) times daily as needed for pain. For pain   aspirin 81 MG tablet Take 81 mg by mouth daily.   atorvastatin 20 MG tablet Commonly known as: LIPITOR Take 1 tablet (20 mg total) by mouth at bedtime.   benazepril 20 MG tablet Commonly known as: LOTENSIN Take 1 tablet (20 mg total) by mouth daily. Start taking on: January 13, 2024   diltiazem 120 MG 24 hr capsule Commonly known as:  CARDIZEM CD Take 1 capsule (120 mg total) by mouth daily. Start taking on: January 13, 2024   estradiol 1 MG tablet Commonly known as: ESTRACE Take 1 tablet (1 mg total) by mouth daily.   ezetimibe 10 MG tablet Commonly known as: ZETIA Take 1 tablet (10 mg total) by mouth daily.   ezetimibe-simvastatin 10-40 MG tablet Commonly known as: VYTORIN TAKE 1 TABLET AT BEDTIME   fenofibrate 160 MG tablet TAKE 1 TABLET DAILY   FLUoxetine 20 MG tablet Commonly known as: PROZAC Take 3 tablets (60 mg total) by mouth daily.   Lantus SoloStar 100 UNIT/ML Solostar Pen Generic drug: insulin glargine Inject 30 Units into the skin at bedtime. What changed: how much to take   metFORMIN 850  MG tablet Commonly known as: GLUCOPHAGE Take 1 tablet in the AM, and 2 tablets in the PM. Start taking on: January 14, 2024 What changed: These instructions start on January 14, 2024. If you are unsure what to do until then, ask your doctor or other care provider.   NovoLOG FlexPen 100 UNIT/ML FlexPen Generic drug: insulin aspart Inject 12-15 Units into the skin 3 (three) times daily with meals. INJECT 12 TO 15 UNITS SUBCUTANEOUSLY 3 TIMES A   DAY WITH MEALS   Ozempic (2 MG/DOSE) 8 MG/3ML Sopn Generic drug: Semaglutide (2 MG/DOSE) INJECT 2MG  SUBCUTANEOUSLY  ONCE WEEKLY (EVERY 7 DAYS) AS DIRECTED   pantoprazole 40 MG tablet Commonly known as: PROTONIX Take 1 tablet (40 mg total) by mouth daily.   tobramycin 0.3 % ophthalmic solution Commonly known as: TOBREX Place 1 drop into both eyes 4 (four) times daily. Use eye solution for 3 days after eye injections        Follow-up Information     [facility] [place], [place] Follow up.   Why: For PT OT [facility] [phone] Contact information: [address] [place] [address] [phone]         Seabron Spates R, DO Follow up on 01/20/2024.   Specialty: Family Medicine Why: 1120 AM hospital follow up be there 15 minutes  prior for check in. if you need to change this appointment please clal the office to reschedule Contact information: [address] [place] [address] [phone]         [employer] for transportation Follow up.   Contact information: Call the number onthe back of your card, they will need a few days notice.        Seabron Spates R, DO Follow up in 1 week(s).   Specialty: Family Medicine Contact information: [address] [place] [address] [phone]                Allergies  Allergen Reactions   Apple Other (See Comments)    Unable to digest the apple peel.   Hydrocodone-Acetaminophen Other (See Comments)    unknown   Oxycodone Hcl Other (See Comments)    unknown   Penicillins Other (See Comments)    unknown   Prednisone     Patient has noted allergy to prednisone but she says it was a questionable history of mild nausea with prednisone and nothing more     Consultations: Cardiology   Procedures/Studies: US RENAL Result Date: 01/11/2024 CLINICAL DATA:  Acute kidney injury EXAM: RENAL / URINARY TRACT ULTRASOUND COMPLETE COMPARISON:  CT abdomen and pelvis 01/08/2024 FINDINGS: Right Kidney: Renal measurements: 11.2 by 5.2 x 5.5 cm = volume: 160 mL. Echogenicity within normal limits. No mass or hydronephrosis visualized. Left Kidney: Renal measurements: 10.7 x 6.2 x 4.4 cm = volume: 151 mL. Echogenicity within normal limits. There is no hydronephrosis. There is a cyst in the superior pole measuring 4.1 x 3.1 x 3.6 cm. Bladder: Not well seen. Other: None. IMPRESSION: 1. No hydronephrosis. 2. Left renal cyst. Electronically Signed   By: Darliss Cheney M.D.   On: 01/11/2024 00:07   ECHOCARDIOGRAM COMPLETE Result Date: 01/09/2024    ECHOCARDIOGRAM REPORT   Patient Name:   DANYIEL CRESPIN Date of Exam: 01/09/2024 Medical Rec #:  [id number]                  Height:  62.0 in Accession #:    [id number]                  Weight:       278.0 lb Date of Birth:  04/29/1951                  BSA:          2.199 m Patient Age:    72 years                   BP:           114/48 mmHg Patient Gender: F                          HR:           61 bpm. Exam Location:  Inpatient Procedure: 2D Echo, Color Doppler and Cardiac Doppler (Both Spectral and Color            Flow Doppler were utilized during procedure). Indications:    I48.91* Unspecified atrial fibrillation  History:        Patient has no prior history of Echocardiogram examinations.  Sonographer:    Irving Burton Senior RDCS Referring Phys: [id number] Kateri Mc LATIF [facility] IMPRESSIONS  1. Left ventricular ejection fraction, by estimation, is 55 to 60%. The left ventricle has normal function. The left ventricle has no regional wall motion abnormalities. There is mild concentric left ventricular hypertrophy. Left ventricular diastolic parameters were normal.  2. Right ventricular systolic function is normal. The right ventricular size is normal. Mildly increased right ventricular wall thickness. Tricuspid regurgitation signal is inadequate for assessing PA pressure.  3. Left atrial size was mildly dilated.  4. A small pericardial effusion is present. The pericardial effusion is posterior to the left ventricle. There is no evidence of cardiac tamponade.  5. The mitral valve is normal in structure. No evidence of mitral valve regurgitation.  6. The aortic valve is tricuspid. Aortic valve regurgitation is not visualized.  7. The inferior vena cava is normal in size with <50% respiratory variability, suggesting right atrial pressure of 8 mmHg. FINDINGS  Left Ventricle: Left ventricular ejection fraction, by estimation, is 55 to 60%. The left ventricle has normal function. The left ventricle has no regional wall motion abnormalities. The left ventricular internal cavity size was normal in size. There is  mild concentric left ventricular hypertrophy. Left ventricular diastolic parameters were normal.  Right Ventricle: The right ventricular size is normal. Mildly increased right ventricular wall thickness. Right ventricular systolic function is normal. Tricuspid regurgitation signal is inadequate for assessing PA pressure. Left Atrium: Left atrial size was mildly dilated. Right Atrium: Right atrial size was normal in size. Pericardium: A small pericardial effusion is present. The pericardial effusion is posterior to the left ventricle. There is no evidence of cardiac tamponade. Mitral Valve: The mitral valve is normal in structure. No evidence of mitral valve regurgitation. Tricuspid Valve: The tricuspid valve is normal in structure. Tricuspid valve regurgitation is trivial. Aortic Valve: The aortic valve is tricuspid. Aortic valve regurgitation is not visualized. Pulmonic Valve: The pulmonic valve was normal in structure. Pulmonic valve regurgitation is not visualized. Aorta: The aortic root and ascending aorta are structurally normal, with no evidence of dilitation. Venous: The inferior vena cava is normal in size with less than 50% respiratory variability, suggesting right atrial pressure of 8 mmHg. IAS/Shunts: No atrial level shunt detected by color flow Doppler.  LEFT VENTRICLE  PLAX 2D LVIDd:         4.80 cm   Diastology LVIDs:         3.30 cm   LV e' medial:    6.74 cm/s LV PW:         1.10 cm   LV E/e' medial:  13.9 LV IVS:        1.00 cm   LV e' lateral:   7.51 cm/s LVOT diam:     1.90 cm   LV E/e' lateral: 12.5 LV SV:         62 LV SV Index:   28 LVOT Area:     2.84 cm  RIGHT VENTRICLE RV S prime:     17.50 cm/s TAPSE (M-mode): 2.0 cm LEFT ATRIUM             Index        RIGHT ATRIUM           Index LA diam:        4.40 cm 2.00 cm/m   RA Area:     14.20 cm LA Vol (A2C):   73.1 ml 33.24 ml/m  RA Volume:   30.20 ml  13.73 ml/m LA Vol (A4C):   55.1 ml 25.06 ml/m LA Biplane Vol: 66.9 ml 30.42 ml/m  AORTIC VALVE LVOT Vmax:   125.00 cm/s LVOT Vmean:  74.000 cm/s LVOT VTI:    0.218 m  AORTA Ao Root diam:  3.10 cm Ao Asc diam:  3.50 cm MITRAL VALVE MV Area (PHT): 3.19 cm    SHUNTS MV Decel Time: 238 msec    Systemic VTI:  0.22 m MV E velocity: 93.55 cm/s  Systemic Diam: 1.90 cm MV A velocity: 62.60 cm/s MV E/A ratio:  1.49 Arvilla Meres MD Electronically signed by Arvilla Meres MD Signature Date/Time: 01/09/2024/12:40:02 PM    Final    CT ABDOMEN PELVIS W CONTRAST Result Date: 01/08/2024 CLINICAL DATA:  Blunt abdominal trauma EXAM: CT ABDOMEN AND PELVIS WITH CONTRAST TECHNIQUE: Multidetector CT imaging of the abdomen and pelvis was performed using the standard protocol following bolus administration of intravenous contrast. RADIATION DOSE REDUCTION: This exam was performed according to the departmental dose-optimization program which includes automated exposure control, adjustment of the mA and/or kV according to patient size and/or use of iterative reconstruction technique. CONTRAST:  75mL OMNIPAQUE IOHEXOL 350 MG/ML SOLN COMPARISON:  None Available. FINDINGS: Lower chest: No acute abnormality.  Small pericardial effusion Hepatobiliary: Moderate hepatic steatosis. No enhancing intrahepatic mass. No intra or extrahepatic biliary ductal dilation. Gallbladder unremarkable. Pancreas: Unremarkable Spleen: Unremarkable Adrenals/Urinary Tract: The adrenal glands are unremarkable. Adrenal glands are unremarkable. Multiple cortical hypodensities are seen arising from the renal cortices bilaterally, the largest of which are compatible with simple cortical cysts for which no follow-up imaging is recommended. Partially calcified involuted cyst noted arising from the lower pole left kidney for which no follow-up imaging is recommended. The kidneys are otherwise unremarkable. Bladder unremarkable. Stomach/Bowel: Stomach is within normal limits. Appendix appears normal. No evidence of bowel wall thickening, distention, or inflammatory changes. Vascular/Lymphatic: Aortic atherosclerosis. No enlarged abdominal or pelvic lymph  nodes. Reproductive: Status post hysterectomy. No adnexal masses. Other: A 10.8 x 11.0 x 4.9 cm (volume = 300 cm^3) hematoma is seen within the left lower quadrant extraperitoneal fat, best seen at axial image # 75/3. No active extravasation is identified. No abdominal wall hernia. No abdominopelvic ascites or hemoperitoneum. Musculoskeletal: Osseous structures are age-appropriate. No acute bone abnormality IMPRESSION: 1. Large hematoma  within the left lower quadrant extraperitoneal fat. No active extravasation identified. 2. Moderate hepatic steatosis. 3. Small pericardial effusion. Aortic Atherosclerosis (ICD10-I70.0). Electronically Signed   By: Helyn Numbers M.D.   On: 01/08/2024 21:54   CT Head Wo Contrast Result Date: 01/08/2024 CLINICAL DATA:  Trauma EXAM: CT HEAD WITHOUT CONTRAST TECHNIQUE: Contiguous axial images were obtained from the base of the skull through the vertex without intravenous contrast. RADIATION DOSE REDUCTION: This exam was performed according to the departmental dose-optimization program which includes automated exposure control, adjustment of the mA and/or kV according to patient size and/or use of iterative reconstruction technique. COMPARISON:  MRI brain 03/18/2021 FINDINGS: Brain: No evidence of acute infarction, hemorrhage, hydrocephalus, extra-axial collection or mass lesion/mass effect. Vascular: No hyperdense vessel or unexpected calcification. Skull: Normal. Negative for fracture or focal lesion. Sinuses/Orbits: No acute finding. Other: None. IMPRESSION: No acute intracranial abnormality. Electronically Signed   By: Darliss Cheney M.D.   On: 01/08/2024 21:54   CT Cervical Spine Wo Contrast Result Date: 01/08/2024 CLINICAL DATA:  Neck trauma (Age >= 65y) Polytrauma, blunt EXAM: CT CERVICAL SPINE WITHOUT CONTRAST TECHNIQUE: Multidetector CT imaging of the cervical spine was performed without intravenous contrast. Multiplanar CT image reconstructions were also generated.  RADIATION DOSE REDUCTION: This exam was performed according to the departmental dose-optimization program which includes automated exposure control, adjustment of the mA and/or kV according to patient size and/or use of iterative reconstruction technique. COMPARISON:  None Available. FINDINGS: Alignment: Normal. Skull base and vertebrae: Grade cervical alignment is normal. The atlantodental interval is not widened. No acute fracture of the cervical spine. Soft tissues and spinal canal: No prevertebral fluid or swelling. No visible canal hematoma. Disc levels: There is endplate remodeling and disc annular calcifications at C3-C7 in keeping with changes of mild to moderate degenerative disc disease. Prevertebral soft tissues are not thickened sagittal reformats. 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 No high-grade canal stenosis. No significant neuroforaminal narrowing. Upper chest: Negative. Other: None IMPRESSION: 1. No acute fracture or listhesis of the cervical spine. 2. 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 Electronically Signed   By: Helyn Numbers M.D.   On: 01/08/2024 21:44   DG Chest Port 1 View Result Date: 01/08/2024 CLINICAL DATA:  Fall and new onset atrial fibrillation EXAM: PORTABLE CHEST 1 VIEW COMPARISON:  Chest x-ray 04/21/2017 FINDINGS: The heart and mediastinal contours are within normal limits. Interval development of lower left lung zone 1.6 cm and mid left lung zone 1.8 cm nodular density. No pulmonary edema. No pleural effusion. No pneumothorax. No acute osseous abnormality. IMPRESSION: Interval development of lower left lung zone 1.6 cm and mid left lung zone 1.8 cm nodular density. Followup PA and lateral chest X-ray is recommended in 3-4 weeks following therapy to ensure resolution and exclude underlying malignancy. Electronically Signed   By: Tish Frederickson M.D.   On:  01/08/2024 20:04     Discharge Exam: Vitals:   01/12/24 0748 01/12/24 1148  BP: 123/68 (!) 126/52  Pulse: 95 72  Resp: 15 16  Temp: 98 F (36.7 C) 98.1 F (36.7 C)  SpO2: 97% 96%   Vitals:   01/11/24 2330 01/12/24 0314 01/12/24 0748 01/12/24 1148  BP: (!) 136/58 137/69 123/68 (!) 126/52  Pulse: 63  95 72  Resp: 20 18 15 16   Temp: 98.4 F (36.9 C) 98.2 F (36.8 C) 98 F (36.7  C) 98.1 F (36.7 C)  TempSrc: Oral Oral Oral Oral  SpO2: 94% 99% 97% 96%  Weight:      Height:        General: Pt is alert, awake, not in acute distress Cardiovascular: RRR, S1/S2 +, no rubs, no gallops Respiratory: CTA bilaterally, no wheezing, no rhonchi Abdominal: Soft, NT, ND, bowel sounds +, large bruise at the left flank area Extremities: no edema, no cyanosis    The results of significant diagnostics from this hospitalization (including imaging, microbiology, ancillary and laboratory) are listed below for reference.     Microbiology: Recent Results (from the past 240 hours)  Culture, blood (Routine X 2) w Reflex to ID Panel     Status: None (Preliminary result)   Collection Time: 01/08/24  9:45 PM   Specimen: BLOOD  Result Value Ref Range Status   Specimen Description BLOOD LEFT ANTECUBITAL  Final   Special Requests   Final    BOTTLES DRAWN AEROBIC AND ANAEROBIC Blood Culture adequate volume   Culture   Final    NO GROWTH 4 DAYS Performed at [facility], [address] [place], [place] [address]    Report Status PENDING  Incomplete  Culture, blood (Routine X 2) w Reflex to ID Panel     Status: None (Preliminary result)   Collection Time: 01/08/24  9:45 PM   Specimen: BLOOD LEFT HAND  Result Value Ref Range Status   Specimen Description BLOOD LEFT HAND  Final   Special Requests   Final    BOTTLES DRAWN AEROBIC AND ANAEROBIC Blood Culture results may not be optimal due to an inadequate volume of blood received in culture bottles   Culture   Final    NO GROWTH 4  DAYS Performed at [facility], [address] [place], [place] [address]    Report Status PENDING  Incomplete     Labs: BNP (last 3 results) No results for input(s): "BNP" in the last 8760 hours. Basic Metabolic Panel: Recent Labs  Lab 01/08/24 1950 01/09/24 0500 01/09/24 1304 01/10/24 0340 01/11/24 0310 01/12/24 0853  NA 137  --  140 138 138 137  K 4.2  --  4.8 4.7 4.8 4.4  CL 106  --  105 109 107 108  CO2 23  --  25 25 23 25   GLUCOSE 365*  --  225* 205* 244* 283*  BUN 20  --  27* 31* 35* 21  CREATININE 1.17*  --  1.50* 1.69* 1.40* 0.87  CALCIUM 7.9*  --  8.4* 8.0* 8.4* 8.5*  MG  --  1.4*  --  2.6* 2.3  --   PHOS  --   --  3.9 4.9* 4.0  --    Liver Function Tests: Recent Labs  Lab 01/09/24 1304 01/10/24 0340 01/11/24 0310  AST 17 16 11*  ALT 13 13 11   ALKPHOS 21* 21* 21*  BILITOT 0.6 0.6 0.6  PROT 5.0* 4.5* 4.6*  ALBUMIN 2.8* 2.5* 2.5*   No results for input(s): "LIPASE", "AMYLASE" in the last 168 hours. No results for input(s): "AMMONIA" in the last 168 hours. CBC: Recent Labs  Lab 01/09/24 1304 01/10/24 0340 01/11/24 0310 01/11/24 1720 01/12/24 0853  WBC 10.2 9.2 7.2 6.1 5.8  NEUTROABS 7.1 5.5 4.4 3.9 4.0  HGB 11.4* 10.1* 9.1* 9.8* 10.6*  HCT 36.7 32.9* 29.0* 30.8* 33.6*  MCV 88.2 86.8 86.1 86.8 86.4  PLT 204 164 150 151 161   Cardiac Enzymes: No  results for input(s): "CKTOTAL", "CKMB", "CKMBINDEX", "TROPONINI" in the last 168 hours. BNP: Invalid input(s): "POCBNP" CBG: Recent Labs  Lab 01/11/24 1235 01/11/24 1642 01/11/24 2110 01/12/24 0606 01/12/24 1145  GLUCAP 379* 284* 261* 244* 364*   D-Dimer No results for input(s): "DDIMER" in the last 72 hours. Hgb A1c No results for input(s): "HGBA1C" in the last 72 hours. Lipid Profile No results for input(s): "CHOL", "HDL", "LDLCALC", "TRIG", "CHOLHDL", "LDLDIRECT" in the last 72 hours. Thyroid function studies No results for input(s): "TSH", "T4TOTAL", "T3FREE", "THYROIDAB" in the  last 72 hours.  Invalid input(s): "FREET3" Anemia work up No results for input(s): "VITAMINB12", "FOLATE", "FERRITIN", "TIBC", "IRON", "RETICCTPCT" in the last 72 hours. Urinalysis    Component Value Date/Time   COLORURINE YELLOW 01/08/2024 2004   APPEARANCEUR HAZY (A) 01/08/2024 2004   LABSPEC 1.042 (H) 01/08/2024 2004   PHURINE 5.0 01/08/2024 2004   GLUCOSEU >=500 (A) 01/08/2024 2004   HGBUR NEGATIVE 01/08/2024 2004   HGBUR negative 11/23/2010 0908   BILIRUBINUR NEGATIVE 01/08/2024 2004   BILIRUBINUR neg 12/19/2017 1157   KETONESUR NEGATIVE 01/08/2024 2004   PROTEINUR 30 (A) 01/08/2024 2004   UROBILINOGEN 0.2 12/19/2017 1157   UROBILINOGEN 0.2 11/23/2010 0908   NITRITE NEGATIVE 01/08/2024 2004   LEUKOCYTESUR NEGATIVE 01/08/2024 2004   Sepsis Labs Recent Labs  Lab 01/10/24 0340 01/11/24 0310 01/11/24 1720 01/12/24 0853  WBC 9.2 7.2 6.1 5.8   Microbiology Recent Results (from the past 240 hours)  Culture, blood (Routine X 2) w Reflex to ID Panel     Status: None (Preliminary result)   Collection Time: 01/08/24  9:45 PM   Specimen: BLOOD  Result Value Ref Range Status   Specimen Description BLOOD LEFT ANTECUBITAL  Final   Special Requests   Final    BOTTLES DRAWN AEROBIC AND ANAEROBIC Blood Culture adequate volume   Culture   Final    NO GROWTH 4 DAYS Performed at [facility], [address] [place], [place] [address]    Report Status PENDING  Incomplete  Culture, blood (Routine X 2) w Reflex to ID Panel     Status: None (Preliminary result)   Collection Time: 01/08/24  9:45 PM   Specimen: BLOOD LEFT HAND  Result Value Ref Range Status   Specimen Description BLOOD LEFT HAND  Final   Special Requests   Final    BOTTLES DRAWN AEROBIC AND ANAEROBIC Blood Culture results may not be optimal due to an inadequate volume of blood received in culture bottles   Culture   Final    NO GROWTH 4 DAYS Performed at [facility], [address] [place],  [place] [address]    Report Status PENDING  Incomplete    FURTHER DISCHARGE INSTRUCTIONS:   Get Medicines reviewed and adjusted: Please take all your medications with you for your next visit with your Primary MD   Laboratory/radiological data: Please request your Primary MD to go over all hospital tests and procedure/radiological results at the follow up, please ask your Primary MD to get all Hospital records sent to his/her office.   In some cases, they will be blood work, cultures and biopsy results pending at the time of your discharge. Please request that your primary care M.D. goes through all the records of your hospital data and follows up on these results.   Also Note the following: If you experience worsening of your admission symptoms, develop shortness of breath, life threatening emergency, suicidal or homicidal thoughts you  must seek medical attention immediately by calling 911 or calling your MD immediately  if symptoms less severe.   You must read complete instructions/literature along with all the possible adverse reactions/side effects for all the Medicines you take and that have been prescribed to you. Take any new Medicines after you have completely understood and accpet all the possible adverse reactions/side effects.    Do not drive when taking Pain medications or sleeping medications (Benzodaizepines)   Do not take more than prescribed Pain, Sleep and Anxiety Medications. It is not advisable to combine anxiety,sleep and pain medications without talking with your primary care practitioner   Special Instructions: If you have smoked or chewed Tobacco  in the last 2 yrs please stop smoking, stop any regular Alcohol  and or any Recreational drug use.   Wear Seat belts while driving.   Please note: You were cared for by a hospitalist during your hospital stay. Once you are discharged, your primary care physician will handle any further medical issues. Please note that NO REFILLS  for any discharge medications will be authorized once you are discharged, as it is imperative that you return to your primary care physician (or establish a relationship with a primary care physician if you do not have one) for your post hospital discharge needs so that they can reassess your need for medications and monitor your lab values  Time coordinating discharge: Over 30 minutes  SIGNED:   Hughie Closs, MD  [facility] 01/12/2024, 12:05 PM *Please note that this is a verbal dictation therefore any spelling or grammatical errors are due to the "Dragon Medical One" system interpretation. If 7PM-7AM, please contact night-coverage [email or web address]

## 2024-01-12 NOTE — Plan of Care (Signed)
   Problem: Education: Goal: Ability to describe self-care measures that may prevent or decrease complications (Diabetes Survival Skills Education) will improve Outcome: Progressing Goal: Individualized Educational Video(s) Outcome: Progressing   Problem: Coping: Goal: Ability to adjust to condition or change in health will improve Outcome: Progressing   Problem: Fluid Volume: Goal: Ability to maintain a balanced intake and output will improve Outcome: Progressing   Problem: Health Behavior/Discharge Planning: Goal: Ability to identify and utilize available resources and services will improve Outcome: Progressing Goal: Ability to manage health-related needs will improve Outcome: Progressing   Problem: Metabolic: Goal: Ability to maintain appropriate glucose levels will improve Outcome: Progressing   Problem: Nutritional: Goal: Maintenance of adequate nutrition will improve Outcome: Progressing Goal: Progress toward achieving an optimal weight will improve Outcome: Progressing   Problem: Skin Integrity: Goal: Risk for impaired skin integrity will decrease Outcome: Progressing   Problem: Tissue Perfusion: Goal: Adequacy of tissue perfusion will improve Outcome: Progressing   Problem: Education: Goal: Knowledge of General Education information will improve Description: Including pain rating scale, medication(s)/side effects and non-pharmacologic comfort measures Outcome: Progressing   Problem: Health Behavior/Discharge Planning: Goal: Ability to manage health-related needs will improve Outcome: Progressing   Problem: Clinical Measurements: Goal: Ability to maintain clinical measurements within normal limits will improve Outcome: Progressing Goal: Will remain free from infection Outcome: Progressing Goal: Diagnostic test results will improve Outcome: Progressing Goal: Respiratory complications will improve Outcome: Progressing Goal: Cardiovascular complication will  be avoided Outcome: Progressing   Problem: Activity: Goal: Risk for activity intolerance will decrease Outcome: Progressing   Problem: Nutrition: Goal: Adequate nutrition will be maintained Outcome: Progressing   Problem: Coping: Goal: Level of anxiety will decrease Outcome: Progressing   Problem: Elimination: Goal: Will not experience complications related to bowel motility Outcome: Progressing Goal: Will not experience complications related to urinary retention Outcome: Progressing   Problem: Pain Managment: Goal: General experience of comfort will improve and/or be controlled Outcome: Progressing   Problem: Safety: Goal: Ability to remain free from injury will improve Outcome: Progressing   Problem: Skin Integrity: Goal: Risk for impaired skin integrity will decrease Outcome: Progressing   Problem: Education: Goal: Knowledge of disease or condition will improve Outcome: Progressing Goal: Understanding of medication regimen will improve Outcome: Progressing Goal: Individualized Educational Video(s) Outcome: Progressing   Problem: Activity: Goal: Ability to tolerate increased activity will improve Outcome: Progressing   Problem: Cardiac: Goal: Ability to achieve and maintain adequate cardiopulmonary perfusion will improve Outcome: Progressing   Problem: Health Behavior/Discharge Planning: Goal: Ability to safely manage health-related needs after discharge will improve Outcome: Progressing

## 2024-01-12 NOTE — TOC Transition Note (Signed)
 Transition of Care [facility]) - Discharge Note   Patient Details  Name: Andrea Marks MRN: [id number] Date of Birth: June 25, 1951  Transition of Care [facility]) CM/SW Contact:  Lockie Pares, RN Phone Number: 01/12/2024, 11:49 AM   Clinical Narrative:     Spoke to patient at bedside about discharge planning. Her sister is here from [place]. Discussed home health, she wants a agency that works best with her insurance. She states she may have some trouble with transportation. Instructed her to call her insurance company for transportation assistance to MD appointments. PCP appointment made with patent's permission. Called Cheryl from [place] and she accepted patient. Messaged  MD to put [facility] orders/F2F in  Patient should DC home today, pending labs   Barriers to Discharge: No Barriers Identified   Patient Goals and CMS Choice Patient states their goals for this hospitalization and ongoing recovery are:: Go home          Discharge Placement                       Discharge Plan and Services Additional resources added to the After Visit Summary for                            HH Arranged: PT, OT HH Agency: [facility] Date [facility] Agency Contacted: 01/12/24 Time HH Agency Contacted: 1149 Representative spoke with at [facility] Agency: Becky Sax  Social Drivers of Health (SDOH) Interventions SDOH Screenings   Food Insecurity: No Food Insecurity (01/09/2024)  Housing: Low Risk  (01/09/2024)  Transportation Needs: No Transportation Needs (01/09/2024)  Utilities: Not At Risk (01/09/2024)  Alcohol Screen: Low Risk  (10/13/2021)  Depression (PHQ2-9): Low Risk  (06/20/2023)  Financial Resource Strain: Low Risk  (10/17/2023)  Physical Activity: Inactive (10/17/2023)  Social Connections: Socially Isolated (01/09/2024)  Stress: Stress Concern Present (10/17/2023)  Tobacco Use: Medium Risk (01/09/2024)  Health Literacy: Adequate Health Literacy (06/20/2023)      Readmission Risk Interventions     No data to display

## 2024-01-12 NOTE — Care Management Important Message (Signed)
 Important Message  Patient Details  Name: Andrea Marks MRN: [id number] Date of Birth: 1951-08-19   Important Message Given:  Yes - Medicare IM     Dorena Bodo 01/12/2024, 2:27 PM

## 2024-01-13 ENCOUNTER — Encounter: Payer: Self-pay | Admitting: Family Medicine

## 2024-01-13 ENCOUNTER — Telehealth: Payer: Self-pay | Admitting: *Deleted

## 2024-01-13 DIAGNOSIS — K219 Gastro-esophageal reflux disease without esophagitis: Secondary | ICD-10-CM

## 2024-01-13 LAB — CULTURE, BLOOD (ROUTINE X 2)
Culture: NO GROWTH
Culture: NO GROWTH
Special Requests: ADEQUATE

## 2024-01-13 MED ORDER — PANTOPRAZOLE SODIUM 40 MG PO TBEC: 40.0000 mg | DELAYED_RELEASE_TABLET | Freq: Every day | ORAL | 1 refills | Status: DC

## 2024-01-13 NOTE — Patient Instructions (Signed)
 Visit Information  Thank you for taking time to visit with me today. Please don't hesitate to contact me if I can be of assistance to you before our next scheduled telephone appointment.  Our next appointment is by telephone on Wednesday 01/18/24 at 10:00 am    Please call the care guide team at [phone] if you need to cancel or reschedule your appointment.   Following is a copy of your care plan:   Goals Addressed             This Visit's Progress    TOC 30-day Program Care Plan   On track    Current Barriers:  Provider appointments confirmed patient has scheduled appointments for hospital follow up with PCP on 01/20/24 and new patient appointments with AF clinic on 02/09/24 and cardiology provider on 02/20/24- with plans to attend as scheduled Home Health services confirmed [facility] PT order placed post-hospital discharge: patient understands they will contact her within 48 hours of discharge to schedule initial visit; confirmed patient has phone number for [facility]: [phone]- she and her niece will call if they do not hear from agency- patient understands she can also contact me directly should she need assistance or have concerns/ questions around initiation of home health services   Transportation drives self at baseline and has friend who assists as indicated: she is planning to call her insurance provider to explore options for transportation- she verbalizes very good understanding of how to contact insurance provider and denies need for assistance in completing this task Independent in self care at baseline; resides alone; 1 unplanned hospital admission x last 12 months  RNCM Clinical Goal(s):  Patient will work with the Care Management team over the next 30 days to address Transition of Care Barriers: Provider appointments Home Health services Transportation take all medications exactly as prescribed and will call provider for medication related questions as evidenced by  review of same during [facility] 30-day program weekly outreaches with RN CM attend all scheduled medical appointments: as noted above- in barriers as evidenced by review of same/ collaboration with providers as indicated  through collaboration with Medical illustrator, provider, and care team during weekly TOC 30-day outreaches   Interventions: Evaluation of current treatment plan related to  self management and patient's adherence to plan as established by provider  Transitions of Care:  New goal. 01/13/24 Durable Medical Equipment (DME) needs assessed with patient/caregiver Doctor Visits  - discussed the importance of doctor visits Communication with PCP re: enrollment into [facility] 30-day program  Patient Goals/Self-Care Activities: Participate in Transition of Care Program/Attend TOC scheduled calls Take all medications as prescribed Attend all scheduled provider appointments Call provider office for new concerns or questions   Follow Up Plan:  Telephone follow up appointment with care management team member scheduled for:  Wednesday 01/18/24 at 10:00 am    Plan for next week call: Ensure home health PT/OT services active Follow up re: patient's call to insurance provider re: transportation Ensure no medication questions/ concerns Re-review upcoming provider appointments Progression of TOC initial assessment       Patient verbalizes understanding of instructions and care plan provided today and agrees to view in [place]. Active MyChart status and patient understanding of how to access instructions and care plan via MyChart confirmed with patient.     If you are experiencing a Mental Health or Behavioral Health Crisis or need someone to talk to, please  call the Suicide and Crisis Lifeline: 988 call the [place] National  Suicide Prevention Lifeline: 380 421 4617 or TTY: 978-284-0274 TTY 314-576-2350) to talk to a trained counselor call 1-800-273-TALK (toll free, 24 hour hotline) go to [facility] [address] [place] [phone]) call the [facility] Crisis Line: [phone] call 911   Pls call/ message for questions,  Caryl Pina, RN, BSN, CCRN Alumnus RN Care Manager  Transitions of Care  [facility] - [facility] [phone]: direct office

## 2024-01-13 NOTE — Transitions of Care (Post Inpatient/ED Visit) (Signed)
 01/13/2024  Name: Andrea Marks MRN: [id number] DOB: 04-05-1951  Today's TOC FU Call Status: Today's TOC FU Call Status:: Successful TOC FU Call Completed TOC FU Call Complete Date: 01/13/24 Patient's Name and Date of Birth confirmed.  Transition Care Management Follow-up Telephone Call Date of Discharge: 01/12/24 Discharge Facility: [facility]) Type of Discharge: Inpatient Admission Primary Inpatient Discharge Diagnosis:: mechanical fall/ LLQ hematoma/ A-Fib with RVR How have you been since you were released from the hospital?: Better ("I am pretty much just sore now.  I live alone and am independent; use a walker and a cane and have a life alert system.  My sister and niece are here until Sunday.  This A-Fib stuff is all new to me") Any questions or concerns?: No  Items Reviewed: Did you receive and understand the discharge instructions provided?: Yes (thoroughly reviewed with patient who verbalizes good understanding of same) Medications obtained,verified, and reconciled?: Yes (Medications Reviewed) (Full medication reconciliation/ review completed; no concerns or discrepancies identified; confirmed patient obtained/ is taking all newly Rx'd medications as instructed; self-manages medications and denies questions/ concerns around medications today) Any new allergies since your discharge?: No Dietary orders reviewed?: Yes Type of Diet Ordered:: "Diabetes diet, low sugar and carbs" Do you have support at home?: Yes People in Home: alone Name of Support/Comfort Primary Source: Reports independent in self-care activities; resides alone; local neighbors/ friend assists as/ if needed/ indicated- sister and niece currently assisting temporarily residing with patient until Sunday 01/02/15/25 post-hospitalization  Medications Reviewed Today: Medications Reviewed Today     Reviewed by Michaela Corner, RN (Registered Nurse) on 01/13/24 at 1055  Med List Status: <None>    Medication Order Taking? Sig Documenting Provider Last Dose Status Informant  acetaminophen (TYLENOL) 650 MG CR tablet [id number] Yes Take 1,300 mg by mouth 2 (two) times daily as needed for pain. For pain [provider] Taking Active Self, Pharmacy Records  aspirin 81 MG tablet [id number] Yes Take 81 mg by mouth daily. [provider] Taking Active Self, Pharmacy Records  atorvastatin (LIPITOR) 20 MG tablet [id number] Yes Take 1 tablet (20 mg total) by mouth at bedtime. Hughie Closs, MD Taking Active   benazepril (LOTENSIN) 20 MG tablet [id number] Yes Take 1 tablet (20 mg total) by mouth daily. Hughie Closs, MD Taking Active   diltiazem (CARDIZEM CD) 120 MG 24 hr capsule [id number] Yes Take 1 capsule (120 mg total) by mouth daily. Hughie Closs, MD Taking Active   estradiol (ESTRACE) 1 MG tablet [id number] Yes Take 1 tablet (1 mg total) by mouth daily. Donato Schultz, DO Taking Active Self, Pharmacy Records  ezetimibe (ZETIA) 10 MG tablet [id number] Yes Take 1 tablet (10 mg total) by mouth daily. Hughie Closs, MD Taking Active   ezetimibe-simvastatin (VYTORIN) 10-40 MG tablet [id number] No TAKE 1 TABLET AT BEDTIME  Patient not taking: Reported on 01/13/2024   Donato Schultz, DO Not Taking Active Self, Pharmacy Records           Med Note Michaela Corner   Fri Jan 13, 2024 10:31 AM) 01/13/24: Reports during [facility] call this was discontinued at time of hospital discharge on 01/12/24- verified  fenofibrate 160 MG tablet [id number] Yes TAKE 1 TABLET DAILY Donato Schultz, DO Taking Active Self, Pharmacy Records  FLUoxetine (PROZAC) 20 MG tablet [id number] Yes Take 3 tablets (60 mg total) by mouth daily. Donato Schultz, DO Taking Active Self, Pharmacy Records  insulin aspart (NOVOLOG FLEXPEN)  100 UNIT/ML FlexPen [id number] Yes Inject 12-15 Units into the skin 3 (three) times daily with meals. INJECT 12 TO 15 UNITS SUBCUTANEOUSLY 3 TIMES A   DAY WITH MEALS Carlus Pavlov, MD Taking Active Self, Pharmacy Records  insulin glargine (LANTUS SOLOSTAR) 100 UNIT/ML Solostar Pen [id number] Yes Inject 30 Units into the skin at bedtime. Hughie Closs, MD Taking Active   ipratropium-albuterol (DUONEB) 0.5-2.5 (3) MG/3ML nebulizer solution 3 mL [id number]   Copland, Gwenlyn Found, MD  Active   metFORMIN (GLUCOPHAGE) 850 MG tablet [id number] Yes Take 1 tablet in the AM, and 2 tablets in the PM. Hughie Closs, MD Taking Active   OZEMPIC, 2 MG/DOSE, 8 MG/3ML Namon Cirri [id number] Yes INJECT 2MG  SUBCUTANEOUSLY  ONCE WEEKLY (EVERY 7 DAYS) AS DIRECTED Carlus Pavlov, MD Taking Active Self, Pharmacy Records           Med Note Nedra Hai, NICOLE   Mon Jan 09, 2024  1:54 AM) Inject on Sunday  pantoprazole (PROTONIX) 40 MG tablet [id number] Yes Take 1 tablet (40 mg total) by mouth daily. Donato Schultz, DO Taking Active Self, Pharmacy Records           Med Note Berneta Levins, Shela Leff   Fri Jan 13, 2024 10:36 AM) 01/13/24: Reports during [facility] call-- she needs new Rx sent in to outpatient pharmacy- she is currently out- states she is planning to request refill via My Chart  tobramycin (TOBREX) 0.3 % ophthalmic solution [id number] Yes Place 1 drop into both eyes 4 (four) times daily. Use eye solution for 3 days after eye injections [provider] Taking Active Self, Pharmacy Records           Med Note Michaela Corner   Fri Jan 13, 2024 10:37 AM) 01/13/24: Reports during TOC call- she uses around her eye injections every 9 weeks           Home Care and Equipment/Supplies: Were Home Health Services Ordered?: Yes Name of Home Health Agency:: [facility] PT/ OT: [phone]- patient understands they should call within 48 hours Has Agency set up a time to come to your home?: No EMR reviewed for Home Health Orders: Orders present/patient has not received call (refer to CM for follow-up) (Successfully enrolled into 30-day TOC program) Any new equipment or medical supplies ordered?:  No  Functional Questionnaire: Do you need assistance with bathing/showering or dressing?: No Do you need assistance with meal preparation?: No Do you need assistance with eating?: No Do you have difficulty maintaining continence: No Do you need assistance with getting out of bed/getting out of a chair/moving?: No Do you have difficulty managing or taking your medications?: No  Follow up appointments reviewed: PCP Follow-up appointment confirmed?: Yes Date of PCP follow-up appointment?: 01/20/24 Follow-up Provider: PCP- Dr. Zola Button Specialist [facility] Follow-up appointment confirmed?: Yes Date of Specialist follow-up appointment?: 02/09/24 Follow-Up Specialty Provider:: 02/09/24: New patient appointment with [facility] Clinic; 02/20/24: new patient cardiology provider Do you need transportation to your follow-up appointment?: No Do you understand care options if your condition(s) worsen?: Yes-patient verbalized understanding  SDOH Interventions Today    Flowsheet Row Most Recent Value  SDOH Interventions   Food Insecurity Interventions Intervention Not Indicated  Housing Interventions Intervention Not Indicated  Transportation Interventions Intervention Not Indicated  [drives self at baseline: considering calling insurance provider to explore transportation benefits]  Utilities Interventions Intervention Not Indicated       Goals Addressed             This Visit's  Progress    TOC 30-day Program Care Plan   On track    Current Barriers:  Provider appointments confirmed patient has scheduled appointments for hospital follow up with PCP on 01/20/24 and new patient appointments with AF clinic on 02/09/24 and cardiology provider on 02/20/24- with plans to attend as scheduled Home Health services confirmed [facility] PT order placed post-hospital discharge: patient understands they will contact her within 48 hours of discharge to schedule initial visit; confirmed patient has phone number for  [facility]: [phone]- she and her niece will call if they do not hear from agency- patient understands she can also contact me directly should she need assistance or have concerns/ questions around initiation of home health services   Transportation drives self at baseline and has friend who assists as indicated: she is planning to call her insurance provider to explore options for transportation- she verbalizes very good understanding of how to contact insurance provider and denies need for assistance in completing this task Independent in self care at baseline; resides alone; 1 unplanned hospital admission x last 12 months  RNCM Clinical Goal(s):  Patient will work with the Care Management team over the next 30 days to address Transition of Care Barriers: Provider appointments Home Health services Transportation take all medications exactly as prescribed and will call provider for medication related questions as evidenced by review of same during TOC 30-day program weekly outreaches with RN CM attend all scheduled medical appointments: as noted above- in barriers as evidenced by review of same/ collaboration with providers as indicated  through collaboration with Medical illustrator, provider, and care team during weekly TOC 30-day outreaches   Interventions: Evaluation of current treatment plan related to  self management and patient's adherence to plan as established by provider  Transitions of Care:  New goal. 01/13/24 Durable Medical Equipment (DME) needs assessed with patient/caregiver Doctor Visits  - discussed the importance of doctor visits Communication with PCP re: enrollment into [facility] 30-day program  Patient Goals/Self-Care Activities: Participate in Transition of Care Program/Attend TOC scheduled calls Take all medications as prescribed Attend all scheduled provider appointments Call provider office for new concerns or questions   Follow Up Plan:  Telephone follow up appointment  with care management team member scheduled for:  Wednesday 01/18/24 at 10:00 am    Plan for next week call: Ensure home health PT/OT services active Follow up re: patient's call to insurance provider re: transportation Ensure no medication questions/ concerns Re-review upcoming provider appointments Progression of TOC initial assessment        Total time spent from review to signing of note/ including any care coordination interventions:  79 minutes/ creation of care plan; initiation of TOC initial assessment  Pls call/ message for questions,  Caryl Pina, RN, BSN, Media planner  Transitions of Care  [facility] - [facility] [phone]: direct office

## 2024-01-16 MED ORDER — PANTOPRAZOLE SODIUM 40 MG PO TBEC: 40.0000 mg | DELAYED_RELEASE_TABLET | Freq: Every day | ORAL | 1 refills | Status: DC

## 2024-01-16 NOTE — Addendum Note (Signed)
 Addended by: Roxanne Gates on: 01/16/2024 08:52 AM   Modules accepted: Orders

## 2024-01-18 ENCOUNTER — Other Ambulatory Visit: Payer: Self-pay | Admitting: *Deleted

## 2024-01-18 NOTE — Patient Instructions (Signed)
 Visit Information  Thank you for taking time to visit with me today. Please don't hesitate to contact me if I can be of assistance to you before our next scheduled telephone appointment.  Our next appointment is by telephone on Tuesday 01/24/24 at 11:00 am  Please call the care guide team at [phone] if you need to cancel or reschedule your appointment.   Following are the goals we discussed today:  Patient Goals/Self-Care Activities: Participate in Transition of Care Program/Attend TOC scheduled calls Take all medications as prescribed Attend all scheduled provider appointments Call provider office for new concerns or questions  Continue pacing activity as your recuperation from recent surgery continues Continue monitoring and recording your blood sugars at home Use assistive devices as needed to prevent falls Continue working with the home health team that is involved in your care  If you are experiencing a Mental Health or Behavioral Health Crisis or need someone to talk to, please  call the Suicide and Crisis Lifeline: 988 call the [place] National Suicide Prevention Lifeline: 408-808-8192 or TTY: 517 764 4321 TTY 802-872-0949) to talk to a trained counselor call 1-800-273-TALK (toll free, 24 hour hotline) go to [facility] [address] [place] [phone]) call the [facility] Crisis Line: (914)213-8925 call 911   Patient verbalizes understanding of instructions and care plan provided today and agrees to view in MyChart. Active MyChart status and patient understanding of how to access instructions and care plan via MyChart confirmed with patient.      Caryl Pina, RN, BSN, Media planner  Transitions of Care  [facility] - [facility] [phone]: direct office

## 2024-01-18 NOTE — Patient Outreach (Signed)
 Care Management  Transitions of Care Program Transitions of Care Post-discharge week 2/ day # 6   01/18/2024 Name: Andrea Marks MRN: [id number] DOB: 08/31/1951  Subjective: Andrea Marks is a 73 y.o. year old female who is a primary care patient of Donato Schultz, DO. The Care Management team Engaged with patient Engaged with patient by telephone to assess and address transitions of care needs.   Consent to Services:  Patient was given information about care management services, agreed to services, and gave verbal consent to participate.  Enrolled into TOC 30-day program:  01/13/24  Assessment: "I am doing really great!  Not having any issues at all; still just a little pain where that huge bruise is from the hematoma; but it's manageable with tylenol and rest; I'm still sleeping downstairs in my recliner, hope to eventually be able to go back upstairs, the home health PT is working with me and I am still using the walker.  PT is going great.  I called the insurance company as you suggested, and they are transporting me to the doctor appointment on Friday-- I just hope that goes smoothly, because it is the first time I have used this service"    Denies clinical concerns and sounds to be in no distress throughout [facility] 30-day program outreach call today          SDOH Interventions    Flowsheet Row Telephone from 01/13/2024 in [place] POPULATION HEALTH DEPARTMENT Clinical Support from 06/20/2023 in [facility] Primary [facility] at [facility] Chronic Care Management from 10/13/2021 in [facility] Primary Care at [facility]  SDOH Interventions     Food Insecurity Interventions Intervention Not Indicated Intervention Not Indicated Intervention Not Indicated  Housing Interventions Intervention Not Indicated Intervention Not Indicated Intervention Not Indicated  Transportation Interventions Intervention Not Indicated  [drives self at  baseline: considering calling insurance provider to explore transportation benefits] Intervention Not Indicated Intervention Not Indicated  Utilities Interventions Intervention Not Indicated Intervention Not Indicated --  Alcohol Usage Interventions -- Intervention Not Indicated (Score <7) --  Depression Interventions/Treatment  -- -- Counseling  Financial Strain Interventions -- Intervention Not Indicated Intervention Not Indicated  Physical Activity Interventions -- Intervention Not Indicated Patient Refused  Stress Interventions -- Intervention Not Indicated Offered [employer], Provide Counseling  Social Connections Interventions -- Intervention Not Indicated Intervention Not Indicated, Patient Refused  Health Literacy Interventions -- Intervention Not Indicated --        Goals Addressed             This Visit's Progress    TOC 30-day Program Care Plan   On track    Current Barriers:  Provider appointments confirmed patient has scheduled appointments for hospital follow up with PCP on 01/20/24 and new patient appointments with AF clinic on 02/09/24 and cardiology provider on 02/20/24- with plans to attend as scheduled Home Health services confirmed [facility] PT order placed post-hospital discharge: patient understands they will contact her within 48 hours of discharge to schedule initial visit; confirmed patient has phone number for [facility]: [phone]- she and her niece will call if they do not hear from agency- patient understands she can also contact me directly should she need assistance or have concerns/ questions around initiation of home health services   Transportation drives self at baseline and has friend who assists as indicated: she is planning to call her insurance provider to explore options for transportation- she verbalizes very good  understanding of how to contact insurance provider and denies need for assistance in completing this task Independent in  self care at baseline; resides alone; 1 unplanned hospital admission x last 12 months  RNCM Clinical Goal(s):  Patient will work with the Care Management team over the next 30 days to address Transition of Care Barriers: Provider appointments Home Health services Transportation take all medications exactly as prescribed and will call provider for medication related questions as evidenced by review of same during TOC 30-day program weekly outreaches with RN CM attend all scheduled medical appointments: as noted above- in barriers as evidenced by review of same/ collaboration with providers as indicated  through collaboration with Medical illustrator, provider, and care team during weekly TOC 30-day outreaches   Interventions: Evaluation of current treatment plan related to  self management and patient's adherence to plan as established by provider  Transitions of Care:  Goal on track:  Yes. 01/18/24 Durable Medical Equipment (DME) needs assessed with patient/caregiver Doctor Visits  - discussed the importance of doctor visits Discussed current clinical condition:  "I am doing really great!  Not having any issues at all; still just a little pain where that huge bruise is from the hematoma; but it's manageable with tylenol and rest; I'm still sleeping downstairs in my recliner, hope to eventually be able to go back upstairs, the home health PT is working with me and I am still using the walker.  PT is going great.  I called the insurance company as you suggested, and they are transporting me to the doctor appointment on Friday-- I just hope that goes smoothly, because it is the first time I have used this service;"  denies specific clinical concerns today Pain assessment completed: reports manageable pain using tylenol and rest alone Home Health PT services- confirmed active; encouraged patient's active participation/ engagement  confirmed uses assistive devices on regular basis, at baseline -- rollator  walker role of home health services with importance of participation/ ongoing engagement Confirmed patient contacted insurance company around transportation benefits/ she denies questions- verbalizes good understanding of how to contact insurance provider should questions arise in the future around transportation or other benefits: she is uncertain she will "like" using this benefit/ hoping it "will go smoothly on Friday" with scheduled PCP office visit  Reviewed upcoming PCP provider office visit scheduled for Friday 01/20/24: confirmed patient is aware of and has plans to attend as scheduled/ using insurance transportation for the first time Confirmed she is continuing to wear heart monitor at home: last day to wear 01/26/24- she verbalizes very good understanding of how to return once monitoring at home is complete Provided initial education around signs/ symptoms AF, along with corresponding action plan: patient verbalizes good understanding of same and denies recent signs/ symptoms Confirmed patient continues monitoring blood sugars at home using CGM: verbalizes good understanding of insulin dosing/ difference between long and short acting insulin therapy Reviewed recent blood sugars at home: patient reports today, "morning blood sugar was 218;" reports baseline fasting blood sugars "vary;" reports "has had diabetes a long time;" verbalizes overall good understanding of self-management of diabetes- confirms has regular visits with endocrinology provider- next visit: 02/21/24 TOC 30-day program initial assessment progression today  Patient Goals/Self-Care Activities: Participate in Transition of Care Program/Attend TOC scheduled calls Take all medications as prescribed Attend all scheduled provider appointments Call provider office for new concerns or questions  Continue pacing activity as your recuperation from recent surgery continues Continue monitoring and recording  your blood sugars at  home Use assistive devices as needed to prevent falls Continue working with the home health team that is involved in your care  Follow Up Plan:  Telephone follow up appointment with care management team member scheduled for:  Tuesday 01/24/24 at 11:00 am    Plan for next week call: Ensure home health PT/OT services remain active Follow up re: patient's transportation benefit insurance provider use re: transportation on 01/20/24- PCP office visit for hospital follow up Review PCP hospital follow up visit 01/20/24 Ensure no medication questions/ concerns Re-review upcoming provider appointments Complete of TOC initial assessment       Plan: Telephone follow up appointment with care management team member scheduled for:   Tuesday 01/24/24 at 11:00 am  Total time spent from review to signing of note/ including any care coordination interventions:  80 minutes  Pls call/ message for questions,  Caryl Pina, RN, BSN, CCRN Alumnus RN Care Manager  Transitions of Care  [facility] - [facility] [phone]: direct office

## 2024-01-20 ENCOUNTER — Telehealth: Payer: Self-pay | Admitting: Neurology

## 2024-01-20 ENCOUNTER — Encounter: Payer: Self-pay | Admitting: Family Medicine

## 2024-01-20 ENCOUNTER — Ambulatory Visit: Admitting: Family Medicine

## 2024-01-20 VITALS — BP 148/68 | HR 70 | Temp 97.7°F | Resp 20 | Ht 62.0 in | Wt 276.6 lb

## 2024-01-20 DIAGNOSIS — K219 Gastro-esophageal reflux disease without esophagitis: Secondary | ICD-10-CM | POA: Diagnosis not present

## 2024-01-20 DIAGNOSIS — G4733 Obstructive sleep apnea (adult) (pediatric): Secondary | ICD-10-CM | POA: Diagnosis not present

## 2024-01-20 DIAGNOSIS — E11649 Type 2 diabetes mellitus with hypoglycemia without coma: Secondary | ICD-10-CM

## 2024-01-20 DIAGNOSIS — I480 Paroxysmal atrial fibrillation: Secondary | ICD-10-CM | POA: Diagnosis not present

## 2024-01-20 DIAGNOSIS — Z794 Long term (current) use of insulin: Secondary | ICD-10-CM

## 2024-01-20 LAB — CBC WITH DIFFERENTIAL/PLATELET
Basophils Absolute: 0.1 10*3/uL (ref 0.0–0.1)
Basophils Relative: 0.6 % (ref 0.0–3.0)
Eosinophils Absolute: 0.2 10*3/uL (ref 0.0–0.7)
Eosinophils Relative: 2.1 % (ref 0.0–5.0)
HCT: 37.4 % (ref 36.0–46.0)
Hemoglobin: 11.9 g/dL — ABNORMAL LOW (ref 12.0–15.0)
Lymphocytes Relative: 18.7 % (ref 12.0–46.0)
Lymphs Abs: 1.6 10*3/uL (ref 0.7–4.0)
MCHC: 31.9 g/dL (ref 30.0–36.0)
MCV: 86.5 fl (ref 78.0–100.0)
Monocytes Absolute: 0.6 10*3/uL (ref 0.1–1.0)
Monocytes Relative: 6.6 % (ref 3.0–12.0)
Neutro Abs: 6.1 10*3/uL (ref 1.4–7.7)
Neutrophils Relative %: 72 % (ref 43.0–77.0)
Platelets: 247 10*3/uL (ref 150.0–400.0)
RBC: 4.32 Mil/uL (ref 3.87–5.11)
RDW: 17.4 % — ABNORMAL HIGH (ref 11.5–15.5)
WBC: 8.5 10*3/uL (ref 4.0–10.5)

## 2024-01-20 LAB — COMPREHENSIVE METABOLIC PANEL
ALT: 8 U/L (ref 0–35)
AST: 13 U/L (ref 0–37)
Albumin: 3.9 g/dL (ref 3.5–5.2)
Alkaline Phosphatase: 30 U/L — ABNORMAL LOW (ref 39–117)
BUN: 29 mg/dL — ABNORMAL HIGH (ref 6–23)
CO2: 28 meq/L (ref 19–32)
Calcium: 9.2 mg/dL (ref 8.4–10.5)
Chloride: 102 meq/L (ref 96–112)
Creatinine, Ser: 0.94 mg/dL (ref 0.40–1.20)
GFR: 60.45 mL/min (ref >60.00)
Glucose, Bld: 183 mg/dL — ABNORMAL HIGH (ref 70–99)
Potassium: 4.5 meq/L (ref 3.5–5.1)
Sodium: 139 meq/L (ref 135–145)
Total Bilirubin: 0.7 mg/dL (ref 0.2–1.2)
Total Protein: 5.9 g/dL — ABNORMAL LOW (ref 6.0–8.3)

## 2024-01-20 MED ORDER — OMEPRAZOLE 20 MG PO CPDR: 20.0000 mg | DELAYED_RELEASE_CAPSULE | Freq: Every day | ORAL | 3 refills | Status: DC

## 2024-01-20 MED ORDER — FREESTYLE LIBRE 3 SENSOR MISC: 3 refills | Status: AC

## 2024-01-20 MED ORDER — FREESTYLE LIBRE 3 READER DEVI
0 refills | Status: DC
Start: 2024-01-20 — End: 2024-02-04

## 2024-01-20 NOTE — Telephone Encounter (Signed)
 Copied from CRM [id number]. Topic: General - Running Late >> Jan 20, 2024 10:49 AM Kathryne Eriksson wrote: Patient/patient representative is calling because they are running late for an appointment.  >> Jan 20, 2024 10:57 AM Kathryne Eriksson wrote: Patient states she's waiting for transportation and will be about 10-15 minutes late. I advised the patient that as long as she's there by 11:30 she could still be seen, anything after she would have to reschedule.

## 2024-01-20 NOTE — Progress Notes (Signed)
 Established Patient Office Visit  Subjective   Patient ID: Andrea Marks, female    DOB: Sep 28, 1951  Age: 73 y.o. MRN: [id number]  Chief Complaint  Patient presents with   Hospitalization Follow-up    Fall    HPI Discussed the use of AI scribe software for clinical note transcription with the patient, who gave verbal consent to proceed.  History of Present Illness Andrea Marks is a 73 year old female who presents with dizziness and a recent fall.  She experienced a fall in her kitchen, which she attributes to slipping rather than dizziness. After the fall, she was unable to get up and used her emergency button to call for help. Once assisted to a sitting position, she began to feel dizzy. Her blood pressure was recorded as 64/1, which she describes as 'very low'.  During her hospital visit following the fall, she was diagnosed with atrial fibrillation. She was started on medications to manage her condition, but blood thinners were avoided due to the presence of a hematoma. Her medication regimen was adjusted following the hospital visit. She was prescribed aspirin, atorvastatin, lisinopril, diltiazem, and Zetia. Her previous combination medication, Vytorin, was discontinued and replaced with a stronger statin and Zetia separately.  She sustained a hematoma on her side from the fall, which she describes as a 'big lump' and 'very sore'. Due to the hematoma, she was advised against taking blood thinners.  She reports issues with her reflux medication, pantoprazole, which was not covered by her insurance. She has been using over-the-counter Pepcid, which she finds ineffective. She recalls previously being on omeprazole.  Her blood sugar levels have been elevated, with a recent reading of 143 mg/dL, down from over 782 mg/dL during her hospital stay. Her Lantus dosage was increased. She uses a Libre sensor for glucose monitoring, which she finds frustrating due to  frequent charging needs. She has had the device for several years and suspects it may need replacement.     Patient Active Problem List   Diagnosis Date Noted   Atrial fibrillation, rapid (HCC) 01/09/2024   Atrial fibrillation (HCC) 01/09/2024   Persistent atrial fibrillation (HCC) 01/09/2024   Depression with anxiety 10/01/2021   Insomnia 10/01/2021   Vision loss of right eye    Nonintractable headache    Papilledema 02/26/2021   Degenerative arthritis of knee, bilateral 10/30/2020   Degenerative arthritis of right knee 12/25/2018   Fatigue 12/15/2018   Hyperlipidemia associated with type 2 diabetes mellitus (HCC) 12/15/2018   B12 deficiency 12/15/2018   AC (acromioclavicular) joint arthritis 06/20/2018   Left rotator cuff tear 05/29/2018   Hyperlipidemia LDL goal <70 12/19/2017   Morbid obesity (HCC) 11/15/2016   Type 2 diabetes mellitus with hyperglycemia, with long-term current use of insulin (HCC) 10/23/2015   Post-traumatic wound infection 04/17/2013   Thrombophlebitis leg 02/23/2011   TINEA CORPORIS 12/30/2008   Depression, major, single episode, moderate (HCC) 12/14/2006   Essential hypertension 12/14/2006   GERD 12/14/2006   Past Medical History:  Diagnosis Date   Allergy    Anxiety    Arthritis    Cataract    Depression    Diabetes mellitus    GERD (gastroesophageal reflux disease)    Hyperlipidemia    Hypertension    Obesity    Past Surgical History:  Procedure Laterality Date   ABDOMINAL HYSTERECTOMY  11/01/1989   BSO   EYE SURGERY     IR ABLATE LIVER CRYOABLATION  12/31/2021   IR RADIOLOGIST  EVAL & MGMT  12/11/2021   IR RADIOLOGIST EVAL & MGMT  01/07/2022   IR RADIOLOGIST EVAL & MGMT  02/15/2022   Social History   Tobacco Use   Smoking status: Former    Current packs/day: 0.00    Types: Cigarettes    Quit date: 1995    Years since quitting: 30.2    Passive exposure: Past   Smokeless tobacco: Never  Vaping Use   Vaping status: Never Used   Substance Use Topics   Alcohol use: No   Drug use: No   Social History   Socioeconomic History   Marital status: Widowed    Spouse name: Not on file   Number of children: 0   Years of education: 62   Highest education level: 12th grade  Occupational History   Occupation: retired from IT consultant: RETIRED  Tobacco Use   Smoking status: Former    Current packs/day: 0.00    Types: Cigarettes    Quit date: 1995    Years since quitting: 30.2    Passive exposure: Past   Smokeless tobacco: Never  Vaping Use   Vaping status: Never Used  Substance and Sexual Activity   Alcohol use: No   Drug use: No   Sexual activity: Not Currently    Partners: Male  Other Topics Concern   Not on file  Social History Narrative   Lives with husband   Right Handed   Drinks 3-5 cups caffeine daily   Social Drivers of Health   Financial Resource Strain: Low Risk  (10/17/2023)   Overall Financial Resource Strain (CARDIA)    Difficulty of Paying Living Expenses: Not hard at all  Food Insecurity: No Food Insecurity (01/13/2024)   Hunger Vital Sign    Worried About Running Out of Food in the Last Year: Never true    Ran Out of Food in the Last Year: Never true  Transportation Needs: No Transportation Needs (01/13/2024)   PRAPARE - Administrator, Civil Service (Medical): No    Lack of Transportation (Non-Medical): No  Physical Activity: Inactive (10/17/2023)   Exercise Vital Sign    Days of Exercise per Week: 0 days    Minutes of Exercise per Session: 0 min  Stress: Stress Concern Present (10/17/2023)   [employer] of Occupational Health - Occupational Stress Questionnaire    Feeling of Stress : To some extent  Social Connections: Socially Isolated (01/09/2024)   Social Connection and Isolation Panel [NHANES]    Frequency of Communication with Friends and Family: More than three times a week    Frequency of Social Gatherings with Friends and Family: Never     Attends Religious Services: Never    Database administrator or Organizations: No    Attends Banker Meetings: Never    Marital Status: Widowed  Intimate Partner Violence: Not At Risk (01/13/2024)   Humiliation, Afraid, Rape, and Kick questionnaire    Fear of Current or Ex-Partner: No    Emotionally Abused: No    Physically Abused: No    Sexually Abused: No   Family Status  Relation Name Status   Mother  Deceased at age 56   Father  Deceased   Mat Aunt  Deceased   Pat Uncle burney Alive   Pat Uncle sally Deceased   Pat Uncle jerry Deceased   Pat Uncle  Deceased   Pat Uncle  Deceased   Mat Aunt  Alive   Other  (  Not Specified)   PGM  (Not Specified)   Other  (Not Specified)  No partnership data on file   Family History  Problem Relation Age of Onset   Macular degeneration Mother    Heart disease Mother        syncope   Aortic stenosis Mother    Lung disease Father 20       mesothelioma   Cancer Maternal Aunt        stomach   Heart disease Paternal Uncle        cabg   Diabetes Paternal Uncle    Heart disease Paternal Uncle    Diabetes Paternal Uncle    Diabetes Paternal Uncle    Heart disease Paternal Uncle    Heart disease Paternal Uncle    Heart disease Paternal Uncle    Lung cancer Other        asbestos   Diabetes Paternal Grandmother    Cancer Other        lung   Allergies  Allergen Reactions   Apple Other (See Comments)    Unable to digest the apple peel.   Hydrocodone-Acetaminophen Other (See Comments)    unknown   Oxycodone Hcl Other (See Comments)    unknown   Penicillins Other (See Comments)    unknown   Prednisone     Patient has noted allergy to prednisone but she says it was a questionable history of mild nausea with prednisone and nothing more       Review of Systems  Constitutional:  Negative for chills, fever and malaise/fatigue.  HENT:  Negative for congestion and hearing loss.   Eyes:  Negative for blurred vision and  discharge.  Respiratory:  Negative for cough, sputum production and shortness of breath.   Cardiovascular:  Negative for chest pain, palpitations and leg swelling.  Gastrointestinal:  Negative for abdominal pain, blood in stool, constipation, diarrhea, heartburn, nausea and vomiting.  Genitourinary:  Negative for dysuria, frequency, hematuria and urgency.  Musculoskeletal:  Negative for back pain, falls and myalgias.  Skin:  Negative for rash.  Neurological:  Negative for dizziness, sensory change, loss of consciousness, weakness and headaches.  Endo/Heme/Allergies:  Negative for environmental allergies. Does not bruise/bleed easily.  Psychiatric/Behavioral:  Negative for depression and suicidal ideas. The patient is not nervous/anxious and does not have insomnia.       Objective:     BP (!) 148/68 (BP Location: Right Arm, Patient Position: Sitting, Cuff Size: Large)   Pulse 70   Temp 97.7 F (36.5 C) (Oral)   Resp 20   Ht 5\' 2"  (1.575 m)   Wt 276 lb 9.6 oz (125.5 kg)   SpO2 97%   BMI 50.59 kg/m  BP Readings from Last 3 Encounters:  01/20/24 (!) 148/68  01/12/24 (!) 126/52  10/18/23 (!) 142/88   Wt Readings from Last 3 Encounters:  01/20/24 276 lb 9.6 oz (125.5 kg)  01/08/24 278 lb (126.1 kg)  12/23/23 270 lb (122.5 kg)   SpO2 Readings from Last 3 Encounters:  01/20/24 97%  01/12/24 96%  10/18/23 95%      Physical Exam Vitals and nursing note reviewed.  Constitutional:      General: She is not in acute distress.    Appearance: Normal appearance. She is well-developed.  HENT:     Head: Normocephalic and atraumatic.  Eyes:     General: No scleral icterus.       Right eye: No discharge.  Left eye: No discharge.  Cardiovascular:     Rate and Rhythm: Normal rate and regular rhythm.     Heart sounds: No murmur heard. Pulmonary:     Effort: Pulmonary effort is normal. No respiratory distress.     Breath sounds: Normal breath sounds.  Musculoskeletal:         General: Normal range of motion.     Cervical back: Normal range of motion and neck supple.     Right lower leg: No edema.     Left lower leg: No edema.  Skin:    General: Skin is warm and dry.     Findings: Bruising present.     Comments: L flank---  bruising  Neurological:     Mental Status: She is alert and oriented to person, place, and time.  Psychiatric:        Mood and Affect: Mood normal.        Behavior: Behavior normal.        Thought Content: Thought content normal.        Judgment: Judgment normal.    Physical Exam      No results found for any visits on 01/20/24.  Last CBC Lab Results  Component Value Date   WBC 5.8 01/12/2024   HGB 10.6 (L) 01/12/2024   HCT 33.6 (L) 01/12/2024   MCV 86.4 01/12/2024   MCH 27.2 01/12/2024   RDW 15.0 01/12/2024   PLT 161 01/12/2024   Last metabolic panel Lab Results  Component Value Date   GLUCOSE 283 (H) 01/12/2024   NA 137 01/12/2024   K 4.4 01/12/2024   CL 108 01/12/2024   CO2 25 01/12/2024   BUN 21 01/12/2024   CREATININE 0.87 01/12/2024   GFRNONAA >60 01/12/2024   CALCIUM 8.5 (L) 01/12/2024   PHOS 4.0 01/11/2024   PROT 4.6 (L) 01/11/2024   ALBUMIN 2.5 (L) 01/11/2024   BILITOT 0.6 01/11/2024   ALKPHOS 21 (L) 01/11/2024   AST 11 (L) 01/11/2024   ALT 11 01/11/2024   ANIONGAP 4 (L) 01/12/2024   Last lipids Lab Results  Component Value Date   CHOL 130 10/18/2023   HDL 58.10 10/18/2023   LDLCALC 48 10/18/2023   LDLDIRECT 64.0 04/21/2017   TRIG 120.0 10/18/2023   CHOLHDL 2 10/18/2023   Last hemoglobin A1c Lab Results  Component Value Date   HGBA1C 7.8 (H) 01/09/2024   Last thyroid functions Lab Results  Component Value Date   TSH 3.020 01/09/2024   T3TOTAL 121 01/09/2024   Last vitamin D No results found for: "25OHVITD2", "25OHVITD3", "VD25OH" Last vitamin B12 and Folate Lab Results  Component Value Date   VITAMINB12 136 (L) 12/15/2018      The 10-year ASCVD risk score (Arnett DK, et al.,  2019) is: 32.6%    Assessment & Plan:  Assessment and Plan Assessment & Plan Fall with hematoma   She experienced a fall in the kitchen, resulting in a large, painful hematoma on her side. Blood thinners were not prescribed due to the hematoma. Monitor the hematoma for changes in size or pain and avoid blood thinners until it resolves.  Atrial Fibrillation   Diagnosed with atrial fibrillation during a hospital visit following her fall. Blood thinners were not initiated due to the hematoma. She should continue diltiazem, atorvastatin, and aspirin. Follow up with cardiology and attend the arterial AFib clinic on April 10.  Diabetes Mellitus   Elevated blood sugar levels over 300 during the hospital stay led to an  increased Lantus dosage. Current blood sugar improved to 143. She uses a Libre sensor for glucose monitoring, which may need replacement. Continue monitoring blood sugar levels and discuss potential Libre sensor replacement with the endocrinologist. Follow up with the endocrinologist on April 22.  Gastroesophageal Reflux Disease (GERD)   Reflux was managed with over-the-counter Pepcid, which was ineffective. Insurance did not cover pantoprazole. Discussed switching to omeprazole with adjustable dosing. Prescribe omeprazole at a lower dose with the option to double if needed. Inform if a higher dose is required.  Suspected Sleep Apnea   Suspected sleep apnea during the hospital visit was not confirmed. Consider further evaluation for sleep apnea if symptoms persist.   Problem List Items Addressed This Visit       Unprioritized   GERD - Primary   Relevant Medications   omeprazole (PRILOSEC) 20 MG capsule   Other Relevant Orders   CBC with Differential/Platelet   Comprehensive metabolic panel   Atrial fibrillation (HCC)   Relevant Orders   CBC with Differential/Platelet   Comprehensive metabolic panel   Other Visit Diagnoses       Type 2 diabetes mellitus with hypoglycemia  without coma, with long-term current use of insulin (HCC)       Relevant Medications   Continuous Glucose Receiver (FREESTYLE LIBRE 3 READER) DEVI   Continuous Glucose Sensor (FREESTYLE LIBRE 3 SENSOR) MISC   Other Relevant Orders   CBC with Differential/Platelet   Comprehensive metabolic panel     Obstructive sleep apnea syndrome       Relevant Orders   Ambulatory referral to Neurology     Assessment and Plan Assessment & Plan Fall with hematoma   She experienced a fall in the kitchen, resulting in a large, painful hematoma on her side. Blood thinners were not prescribed due to the hematoma. Monitor the hematoma for changes in size or pain and avoid blood thinners until it resolves.  Atrial Fibrillation   Diagnosed with atrial fibrillation during a hospital visit following her fall. Blood thinners were not initiated due to the hematoma. She should continue diltiazem, atorvastatin, and aspirin. Follow up with cardiology and attend the arterial AFib clinic on April 10.  Diabetes Mellitus   Elevated blood sugar levels over 300 during the hospital stay led to an increased Lantus dosage. Current blood sugar improved to 143. She uses a Libre sensor for glucose monitoring, which may need replacement. Continue monitoring blood sugar levels and discuss potential Libre sensor replacement with the endocrinologist. Follow up with the endocrinologist on April 22.  Gastroesophageal Reflux Disease (GERD)   Reflux was managed with over-the-counter Pepcid, which was ineffective. Insurance did not cover pantoprazole. Discussed switching to omeprazole with adjustable dosing. Prescribe omeprazole at a lower dose with the option to double if needed. Inform if a higher dose is required.  Suspected Sleep Apnea   Suspected sleep apnea during the hospital visit was not confirmed. Consider further evaluation for sleep apnea if symptoms persist.    Return in about 3 months (around 04/21/2024), or if symptoms  worsen or fail to improve.    Donato Schultz, DO

## 2024-01-20 NOTE — Patient Instructions (Signed)

## 2024-01-24 ENCOUNTER — Other Ambulatory Visit: Payer: Self-pay | Admitting: *Deleted

## 2024-01-24 ENCOUNTER — Telehealth: Payer: Self-pay

## 2024-01-24 NOTE — Telephone Encounter (Signed)
 Copied from CRM [id number]. Topic: Clinical - Home Health Verbal Orders >> Jan 24, 2024  4:10 PM Almira Coaster wrote: Caller/Agency: [facility] Callback Number: [phone] Service Requested: Occupational Therapy Frequency: 1 time a week for 7 weeks & 3 in 1 commode Any new concerns about the patient? No

## 2024-01-24 NOTE — Patient Outreach (Signed)
 Care Management  Transitions of Care Program Transitions of Care Post-discharge week 3/ day # 12   01/24/2024 Name: Andrea Marks MRN: [id number] DOB: [birth year]  Subjective: Andrea Marks is a 73 y.o. year old female who is a primary care patient of Donato Schultz, DO. The Care Management team Engaged with patient by telephone to assess and address transitions of care needs.   Consent to Services:  Patient was given information about care management services, agreed to services, and gave verbal consent to participate.   Enrolled into TOC 30-day program:  01/13/24  Assessment: "I am still doing really great!  Not having any issues at all; it's not even painful anymore where that huge bruise was from the hematoma; Home health PT was here today and said I am making good progress.  I am looking forward to seeing Dr. Elvera Lennox tomorrow- I want to ask her if it is okay for me to start taking the steroid injections in my knees again-- I know it will run my blood sugar up- but I think it is time for another injection;"  Denies clinical concerns and sounds to be in no distress throughout [facility] 30-day program outreach call today          SDOH Interventions    Flowsheet Row Telephone from 01/13/2024 in [facility] Clinical Support from 06/20/2023 in [facility] Primary Care at [facility] Chronic Care Management from 10/13/2021 in [facility] Primary Care at [facility]  SDOH Interventions     Food Insecurity Interventions Intervention Not Indicated Intervention Not Indicated Intervention Not Indicated  Housing Interventions Intervention Not Indicated Intervention Not Indicated Intervention Not Indicated  Transportation Interventions Intervention Not Indicated  [drives self at baseline: considering calling insurance provider to explore transportation benefits] Intervention Not Indicated Intervention Not Indicated   Utilities Interventions Intervention Not Indicated Intervention Not Indicated --  Alcohol Usage Interventions -- Intervention Not Indicated (Score <7) --  Depression Interventions/Treatment  -- -- Counseling  Financial Strain Interventions -- Intervention Not Indicated Intervention Not Indicated  Physical Activity Interventions -- Intervention Not Indicated Patient Refused  Stress Interventions -- Intervention Not Indicated Offered [employer], Provide Counseling  Social Connections Interventions -- Intervention Not Indicated Intervention Not Indicated, Patient Refused  Health Literacy Interventions -- Intervention Not Indicated --        Goals Addressed             This Visit's Progress    TOC 30-day Program Care Plan   On track    Current Barriers:  Provider appointments confirmed patient has scheduled appointments for hospital follow up with PCP on 01/20/24 and new patient appointments with AF clinic on 02/09/24 and cardiology provider on 02/20/24- with plans to attend as scheduled Home Health services confirmed [facility] PT order placed post-hospital discharge: patient understands they will contact her within 48 hours of discharge to schedule initial visit; confirmed patient has phone number for [facility]: [phone]- she and her niece will call if they do not hear from agency- patient understands she can also contact me directly should she need assistance or have concerns/ questions around initiation of home health services   Transportation drives self at baseline and has friend who assists as indicated: she is planning to call her insurance provider to explore options for transportation- she verbalizes very good understanding of how to contact insurance provider and denies need for assistance in completing this task 01/24/24: confirmed used insurance transportation  benefit for hospital follow up PCP office visit on 01/24/24: "I will never use that benefit again-- it was a  terrible experience;" reports she plans to resume driving herself as of tomorrow for endocrinology provider office visit Independent in self care at baseline; resides alone; 1 unplanned hospital admission x last 12 months  RNCM Clinical Goal(s):  Patient will work with the Care Management team over the next 30 days to address Transition of Care Barriers: Provider appointments Home Health services Transportation take all medications exactly as prescribed and will call provider for medication related questions as evidenced by review of same during TOC 30-day program weekly outreaches with RN CM attend all scheduled medical appointments: as noted above- in barriers as evidenced by review of same/ collaboration with providers as indicated  through collaboration with Medical illustrator, provider, and care team during weekly TOC 30-day outreaches   Interventions: Evaluation of current treatment plan related to  self management and patient's adherence to plan as established by provider  Transitions of Care:  Goal on track:  Yes. 01/24/24 Durable Medical Equipment (DME) needs assessed with patient/caregiver Doctor Visits  - discussed the importance of doctor visits Discussed current clinical condition:  "I am still doing really great!  Not having any issues at all; it's not even painful anymore where that huge bruise was from the hematoma; Home health PT was here today and said I am making good progress.  I am looking forward to seeing Dr. Elvera Lennox tomorrow- I want to ask her if it is okay for me to start taking the steroid injections in my knees again-- I know it will run my blood sugar up- but I think it is time for another injection;" denies specific clinical concerns today Pain assessment completed: denies pain today Home Health PT services- confirmed active; encouraged patient's ongoing active participation/ engagement  Confirmed continues to use assistive devices on regular basis, at baseline -- rollator  walker Reviewed PCP provider office visit for hospital follow up on Friday 01/20/24: verbalizes good understanding of same; denies questions/ concerns post-recent provider office visits Confirmed she is continuing to wear heart monitor at home: last day to wear 01/26/24- she verbalizes very good understanding of how to return once monitoring at home is complete Reinforced previously provided education around signs/ symptoms AF, along with corresponding action plan: patient verbalizes good understanding of same and denies recent signs/ symptoms Reviewed multiple upcoming provider office visits: 01/25/24- endocrinology provider; 02/09/24- AF Clinic provider (new patient); 02/20/24- cardiology provider (new patient): confirmed patient is aware of all and has plans to attend as scheduled- she states she will be driving self; states she "will never use" insurance benefit for transportation again; encouraged her to start out for visit with plently of time to spare, to go slow and take her time as she resumes driving self- she verbalizes agreement and understanding Confirmed patient continues monitoring blood sugars at home using CGM: verbalizes good understanding of insulin dosing/ difference between long and short acting insulin therapy Reviewed most recent A1C value from 01/09/24: 7.8; reviewed historical trends over time with patient who verbalizes good understanding of same TOC 30-day program initial assessment progression today  Patient Goals/Self-Care Activities: Participate in Transition of Care Program/Attend TOC scheduled calls Take all medications as prescribed Attend all scheduled provider appointments Call provider office for new concerns or questions  Continue pacing activity as your recuperation from recent surgery continues Continue monitoring and recording your blood sugars at home Use assistive devices as needed to  prevent falls Continue working with the home health team that is involved in  your care  Follow Up Plan:  Telephone follow up appointment with care management team member scheduled for:  Thursday 02/02/24 at 11:00 am    Plan for next week call: Ensure home health PT/OT services remain active Review endocrinology provider office visit 01/25/24 Ensure no medication questions/ concerns Re-review upcoming provider appointments Complete of TOC initial assessment       Plan: Telephone follow up appointment with care management team member scheduled for:   Thursday 02/02/24 at 11:00 am  Total time spent from review to signing of note/ including any care coordination interventions:  44 minutes  Pls call/ message for questions,  Caryl Pina, RN, BSN, CCRN Alumnus RN Care Manager  Transitions of Care  [facility] - [facility] [phone]: direct office

## 2024-01-24 NOTE — Progress Notes (Unsigned)
 Patient ID: Andrea Marks, female   DOB: 1951-09-08, 73 y.o.   MRN: [id number]  HPI: Andrea Marks is a 73 y.o.-year-old female, returning for f/u for DM2, dx 1994, insulin-dependent, uncontrolled, with complications (diabetic retinopathy). Last visit was 5 months ago.  Interim history: No increased urination, nausea, chest pain.  She has B knee pain (has 2 fractures in R knee -  had a nerve ablation).  She has steroid injections every 3 months.  Reviewed HbA1c levels: Lab Results  Component Value Date   HGBA1C 7.8 (H) 01/09/2024   HGBA1C 8.3 (H) 10/18/2023   HGBA1C 6.6 (A) 08/04/2023   She is on: - Metformin 850 mg in am and  1700 mg at night - Ozempic 1 >> 2 mg weekly - Basaglar/Lantus 70 >> 60 >> 50 >> 36 >> 28 units at bedtime - NovoLog 3x a day before meals 15-18 units before meals and 5 to 8 units before a snack >> 12-15 (18) units before meals >> 10-15 before b'fast and lunch, but 8-10 units before dinner Tried Bydureon once a week >> she had problems injecting the medication.  She also tried Victoza. She was on Humalog in the past. She was on Amaryl in the past, which we stopped going to increase her insulin doses 05/2018. She was on a Vgo pump, which we stopped in 2019 as she needed higher doses of insulin.  She checks her sugars more than 4 times a day with her freestyle libre 2 CGM (with receiver - she shut down the alarms):  Previously:  Previously:  Lowest blood sugars: 50s >> 54 >> 58 >> 41. Highest blood sugars: 300s >> 200s >> 300s.  Meals: - Breakfast: egg + bagel + yoghurt - Lunch: may skip, salad - Dinner: salad, hamburger/hot dog, tuna fish - Snacks: potato chips, pretzel No sodas, only drinks sparkling water.  No CKD, last BUN/creatinine was:  Lab Results  Component Value Date   BUN 29 (H) 01/20/2024   CREATININE 0.94 01/20/2024   Lab Results  Component Value Date   MICRALBCREAT 12.3 08/04/2023   MICRALBCREAT 24.2  10/18/2022   MICRALBCREAT 12.0 09/10/2019   MICRALBCREAT 1.7 12/19/2017   MICRALBCREAT 1.4 08/07/2015   MICRALBCREAT 5.4 07/12/2014   MICRALBCREAT 1.0 09/11/2012   MICRALBCREAT 15.3 06/09/2012   MICRALBCREAT 1.4 11/23/2010   MICRALBCREAT 6.5 12/20/2008  On benazepril.  + HL; Last set of lipids: Lab Results  Component Value Date   CHOL 130 10/18/2023   HDL 58.10 10/18/2023   LDLCALC 48 10/18/2023   LDLDIRECT 64.0 04/21/2017   TRIG 120.0 10/18/2023   CHOLHDL 2 10/18/2023  On Vytorin and fenofibrate.  - Last eye exam: 11/01/2023: + DR. She has a history of cataract surgery.  She was admitted with blurry vision, papilledema in 02/26/2021.  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 She saw a retina specialist and a neurologist.  She was on steroids. She started IO injections.  - + numbness and tingling in her feet.  She sees podiatry.  Last foot exam 10/18/2023.  She had an ulcer in lower right leg (has lymphedema) >> healed. She also has a history of HTN, GERD, depression, h/o thrombophlebitis. Previously on B12 injections now on 1000 mcg p.o. B12 daily.  In 03/2022, she lost consciousness after a vomiting episode.  She was down, on the floor, near her bed, the entire night, as she could not get up.  Since then, she ordered another alert bracelet and also an alert  button for the shower.  She was not sure if her sugars were low at that time but the investigation was negative for culprits when paramedics came.  ROS: + see HPI  I reviewed pt's medications, allergies, PMH, social hx, family hx, and changes were documented in the history of present illness. Otherwise, unchanged from my initial visit note.  Past Medical History:  Diagnosis Date   Allergy    Anxiety    Arthritis    Cataract    Depression    Diabetes mellitus    GERD (gastroesophageal reflux disease)    Hyperlipidemia    Hypertension    Obesity    Past Surgical History:  Procedure  Laterality Date   ABDOMINAL HYSTERECTOMY  11/01/1989   BSO   EYE SURGERY     IR ABLATE LIVER CRYOABLATION  12/31/2021   IR RADIOLOGIST EVAL & MGMT  12/11/2021   IR RADIOLOGIST EVAL & MGMT  01/07/2022   IR RADIOLOGIST EVAL & MGMT  02/15/2022   Social History   Socioeconomic History   Marital status: Widowed    Spouse name: Not on file   Number of children: 0   Years of education: 38   Highest education level: 12th grade  Occupational History   Occupation: retired from IT consultant: RETIRED  Tobacco Use   Smoking status: Former    Current packs/day: 0.00    Types: Cigarettes    Quit date: 1995    Years since quitting: 30.2    Passive exposure: Past   Smokeless tobacco: Never  Vaping Use   Vaping status: Never Used  Substance and Sexual Activity   Alcohol use: No   Drug use: No   Sexual activity: Not Currently    Partners: Male  Other Topics Concern   Not on file  Social History Narrative   Lives with husband   Right Handed   Drinks 3-5 cups caffeine daily   Social Drivers of Health   Financial Resource Strain: Low Risk  (10/17/2023)   Overall Financial Resource Strain (CARDIA)    Difficulty of Paying Living Expenses: Not hard at all  Food Insecurity: No Food Insecurity (01/13/2024)   Hunger Vital Sign    Worried About Running Out of Food in the Last Year: Never true    Ran Out of Food in the Last Year: Never true  Transportation Needs: No Transportation Needs (01/13/2024)   PRAPARE - Administrator, Civil Service (Medical): No    Lack of Transportation (Non-Medical): No  Physical Activity: Inactive (10/17/2023)   Exercise Vital Sign    Days of Exercise per Week: 0 days    Minutes of Exercise per Session: 0 min  Stress: Stress Concern Present (10/17/2023)   [employer] of Occupational Health - Occupational Stress Questionnaire    Feeling of Stress : To some extent  Social Connections: Socially Isolated (01/09/2024)   Social  Connection and Isolation Panel [NHANES]    Frequency of Communication with Friends and Family: More than three times a week    Frequency of Social Gatherings with Friends and Family: Never    Attends Religious Services: Never    Database administrator or Organizations: No    Attends Banker Meetings: Never    Marital Status: Widowed  Intimate Partner Violence: Not At Risk (01/13/2024)   Humiliation, Afraid, Rape, and Kick questionnaire    Fear of Current or Ex-Partner: No    Emotionally Abused: No  Physically Abused: No    Sexually Abused: No   Current Outpatient Medications on File Prior to Visit  Medication Sig Dispense Refill   acetaminophen (TYLENOL) 650 MG CR tablet Take 1,300 mg by mouth 2 (two) times daily as needed for pain. For pain     aspirin 81 MG tablet Take 81 mg by mouth daily.     atorvastatin (LIPITOR) 20 MG tablet Take 1 tablet (20 mg total) by mouth at bedtime. 30 tablet 0   benazepril (LOTENSIN) 20 MG tablet Take 1 tablet (20 mg total) by mouth daily.     Continuous Glucose Receiver (FREESTYLE LIBRE 3 READER) DEVI As directed 1 each 0   Continuous Glucose Sensor (FREESTYLE LIBRE 3 SENSOR) MISC Place 1 sensor on the skin every 14 days. Use to check glucose continuously 6 each 3   diltiazem (CARDIZEM CD) 120 MG 24 hr capsule Take 1 capsule (120 mg total) by mouth daily. 30 capsule 0   estradiol (ESTRACE) 1 MG tablet Take 1 tablet (1 mg total) by mouth daily. 90 tablet 1   ezetimibe (ZETIA) 10 MG tablet Take 1 tablet (10 mg total) by mouth daily. 30 tablet 0   fenofibrate 160 MG tablet TAKE 1 TABLET DAILY 90 tablet 1   FLUoxetine (PROZAC) 20 MG tablet Take 3 tablets (60 mg total) by mouth daily. 270 tablet 1   insulin aspart (NOVOLOG FLEXPEN) 100 UNIT/ML FlexPen Inject 12-15 Units into the skin 3 (three) times daily with meals. INJECT 12 TO 15 UNITS SUBCUTANEOUSLY 3 TIMES A   DAY WITH MEALS 45 mL 3   insulin glargine (LANTUS SOLOSTAR) 100 UNIT/ML Solostar  Pen Inject 30 Units into the skin at bedtime.     metFORMIN (GLUCOPHAGE) 850 MG tablet Take 1 tablet in the AM, and 2 tablets in the PM.     omeprazole (PRILOSEC) 20 MG capsule Take 1 capsule (20 mg total) by mouth daily. 30 capsule 3   OZEMPIC, 2 MG/DOSE, 8 MG/3ML SOPN INJECT 2MG  SUBCUTANEOUSLY  ONCE WEEKLY (EVERY 7 DAYS) AS DIRECTED 9 mL 3   tobramycin (TOBREX) 0.3 % ophthalmic solution Place 1 drop into both eyes 4 (four) times daily. Use eye solution for 3 days after eye injections     Current Facility-Administered Medications on File Prior to Visit  Medication Dose Route Frequency Provider Last Rate Last Admin   ipratropium-albuterol (DUONEB) 0.5-2.5 (3) MG/3ML nebulizer solution 3 mL  3 mL Nebulization Q6H Copland, Jessica C, MD   3 mL at 11/15/16 1330   Allergies  Allergen Reactions   Apple Other (See Comments)    Unable to digest the apple peel.   Hydrocodone-Acetaminophen Other (See Comments)    unknown   Oxycodone Hcl Other (See Comments)    unknown   Penicillins Other (See Comments)    unknown   Prednisone     Patient has noted allergy to prednisone but she says it was a questionable history of mild nausea with prednisone and nothing more    Family History  Problem Relation Age of Onset   Macular degeneration Mother    Heart disease Mother        syncope   Aortic stenosis Mother    Lung disease Father 8       mesothelioma   Cancer Maternal Aunt        stomach   Heart disease Paternal Uncle        cabg   Diabetes Paternal Uncle    Heart disease Paternal  Uncle    Diabetes Paternal Uncle    Diabetes Paternal Uncle    Heart disease Paternal Uncle    Heart disease Paternal Uncle    Heart disease Paternal Uncle    Lung cancer Other        asbestos   Diabetes Paternal Grandmother    Cancer Other        lung   PE: There were no vitals taken for this visit. Wt Readings from Last 10 Encounters:  01/20/24 276 lb 9.6 oz (125.5 kg)  01/08/24 278 lb (126.1 kg)   12/23/23 270 lb (122.5 kg)  10/18/23 270 lb 9.6 oz (122.7 kg)  09/22/23 269 lb 9.6 oz (122.3 kg)  08/04/23 269 lb 9.6 oz (122.3 kg)  12/22/22 258 lb (117 kg)  10/18/22 257 lb 9.6 oz (116.8 kg)  08/20/22 263 lb 9.6 oz (119.6 kg)  03/23/22 259 lb 6.4 oz (117.7 kg)   Constitutional: overweight, in NAD Eyes: EOMI, no exophthalmos ENT: no thyromegaly, no cervical lymphadenopathy Cardiovascular: RRR, No MRG, + B LE edema Respiratory: CTA B Musculoskeletal: no deformities Skin: no rashes except stasis dermatitis of bilateral lower legs Neurological: no tremor with outstretched hands.  ASSESSMENT: 1. DM2, insulin-dependent, uncontrolled, with complications - DR  2. Obesity class 3  3. HL  PLAN:  1. Patient with history of uncontrolled type 2 diabetes, insulin-dependent, with worse control in 2020 when she returns after an absence of 1.5 years.  At that time, she had a lot of stress and relaxed her diet and sugars were in the 200s to 400s.  We discussed about improving diet, increasing her insulin and GLP-1 receptor agonist doses and sugars improved afterwards.  We were able to decrease the doses of her insulin afterwards.  A nadir HbA1c was 5.9%.  After this, however, sugars started to increase and at last visit HbA1c was 6.6%.  At that time sugars are higher and more fluctuating in the previous 2 weeks after steroid injections in the knees.  Also, she relaxed her diet over the summer and she was forgetting to take NovoLog injections before meals and was taking them after meals.  We discussed about the fact that she absolutely needs to inject NovoLog 15 minutes before meals but if she forgot that sugars already high after the meal I advised her to only take half of the recommended dose to avoid lows postprandially.  We did not change the rest of the regimen.  I also recommended that if she had to have more steroid injections, to take more NovoLog insulin. -Since last visit, she had another  HbA1c earlier this month and this was higher, at 7.8% CGM interpretation: -At today's visit, we reviewed her CGM downloads: It appears that *** of values are in target range (goal >70%), while *** are higher than 180 (goal <25%), and *** are lower than 70 (goal <4%).  The calculated average blood sugar is ***.  The projected HbA1c for the next 3 months (GMI) is ***. -Reviewing the CGM trends, ***  - I advised her to:  Patient Instructions  Please continue: - Metformin 850 mg in am and 1700 mg at night - Ozempic 2 mg weekly - Basaglar 28 units at bedtime - NovoLog 15 min before meals: 10-15 units before b'fast and lunch 8-10 units before dinner  (if you inject after the meals, take only 50% of the dose) Around the time of steroids, take the higher NovoLog dose.  Please return in 3-4 months.  -  we checked her HbA1c: 7%  - advised to check sugars at different times of the day - 4x a day, rotating check times - advised for yearly eye exams >> she is UTD - return to clinic in 3-4 months  2. Obesity class 3  -Will continue Ozempic which should also help with weight loss -She lost 16 pounds before the last 2 visits combined -She gained 7 pounds since then.  3. HL -Latest lipid panel was reviewed from last visit: Fractions at goal: Lab Results  Component Value Date   CHOL 130 10/18/2023   HDL 58.10 10/18/2023   LDLCALC 48 10/18/2023   LDLDIRECT 64.0 04/21/2017   TRIG 120.0 10/18/2023   CHOLHDL 2 10/18/2023  -She is on simvastatin-ezetimibe 40-10 mg daily and fenofibrate 160 mg daily without side effects  Carlus Pavlov, MD PhD [facility]

## 2024-01-24 NOTE — Patient Instructions (Signed)
 Visit Information  Thank you for taking time to visit with me today. Please don't hesitate to contact me if I can be of assistance to you before our next scheduled telephone appointment.  Our next appointment is by telephone on Thursday 02/02/24 at 11:00 am  Please call the care guide team at [phone] if you need to cancel or reschedule your appointment.   Following are the goals we discussed today:  Patient Goals/Self-Care Activities: Participate in Transition of Care Program/Attend TOC scheduled calls Take all medications as prescribed Attend all scheduled provider appointments Call provider office for new concerns or questions  Continue pacing activity as your recuperation from recent surgery continues Continue monitoring and recording your blood sugars at home Use assistive devices as needed to prevent falls Continue working with the home health team that is involved in your care  If you are experiencing a Mental Health or Behavioral Health Crisis or need someone to talk to, please  call the Suicide and Crisis Lifeline: 988 call the [place] National Suicide Prevention Lifeline: 820 771 9404 or TTY: 475-864-4491 TTY 240-522-5627) to talk to a trained counselor call 1-800-273-TALK (toll free, 24 hour hotline) go to [facility] [address], [place] [phone]) call the [facility] Crisis Line: [phone] call 911   Patient verbalizes understanding of instructions and care plan provided today and agrees to view in MyChart. Active MyChart status and patient understanding of how to access instructions and care plan via MyChart confirmed with patient.     Caryl Pina, RN, BSN, Media planner  Transitions of Care  [facility] - [facility] [phone]: direct office

## 2024-01-25 ENCOUNTER — Ambulatory Visit (INDEPENDENT_AMBULATORY_CARE_PROVIDER_SITE_OTHER): Admitting: Internal Medicine

## 2024-01-25 ENCOUNTER — Encounter: Payer: Self-pay | Admitting: Internal Medicine

## 2024-01-25 VITALS — BP 122/60 | HR 109 | Ht 62.0 in | Wt 273.2 lb

## 2024-01-25 DIAGNOSIS — Z794 Long term (current) use of insulin: Secondary | ICD-10-CM

## 2024-01-25 DIAGNOSIS — E11319 Type 2 diabetes mellitus with unspecified diabetic retinopathy without macular edema: Secondary | ICD-10-CM | POA: Diagnosis not present

## 2024-01-25 DIAGNOSIS — E785 Hyperlipidemia, unspecified: Secondary | ICD-10-CM | POA: Diagnosis not present

## 2024-01-25 NOTE — Telephone Encounter (Signed)
 Verbal given

## 2024-01-25 NOTE — Patient Instructions (Addendum)
 Please continue: - Metformin 850 mg in am and 1700 mg at night - Ozempic 2 mg weekly  Increase: - Basaglar 30 units at bedtime - NovoLog 15 min before meals: 10-15 units before b'fast and lunch 10-15 units before dinner  (if you inject after the meals, take only 50% of the dose) Around the time of steroids, take a higher NovoLog dose (maybe up to 20 units).  Please return in 3-4 months.

## 2024-02-02 ENCOUNTER — Other Ambulatory Visit: Payer: Self-pay | Admitting: *Deleted

## 2024-02-02 NOTE — Patient Outreach (Signed)
 Care Management  Transitions of Care Program Transitions of Care Post-discharge week 4/ day # 21   02/02/2024 Name: Andrea Marks MRN: [id number] DOB: February 28, 1951  Subjective: Andrea Marks is a 73 y.o. year old female who is a primary care patient of Donato Schultz, DO. The Care Management team Engaged with patient Engaged with patient by telephone to assess and address transitions of care needs.   Consent to Services:  Patient was given information about care management services, agreed to services, and gave verbal consent to participate.  Enrolled into TOC 30-day program:  01/13/24  Assessment: "I am still doing great!  No issues-- was able to drive to endocrinology doctor and did fine; she okay's me to get the steroid shots in my knees and I go tomorrow for that; not having any real pain- taking tylenol as needed for minor discomfort; Home health PT still coming and keep saying I am making good progress.  Dr. Elvera Lennox was overall pleased with my blood sugars when I saw her"    Denies clinical concerns and sounds to be in no distress throughout [facility] 30-day program outreach call today          SDOH Interventions    Flowsheet Row Telephone from 01/13/2024 in [place] POPULATION HEALTH DEPARTMENT Clinical Support from 06/20/2023 in [facility] Primary [facility] at [facility] Chronic Care Management from 10/13/2021 in [facility] Primary Care at [facility]  SDOH Interventions     Food Insecurity Interventions Intervention Not Indicated Intervention Not Indicated Intervention Not Indicated  Housing Interventions Intervention Not Indicated Intervention Not Indicated Intervention Not Indicated  Transportation Interventions Intervention Not Indicated  [drives self at baseline: considering calling insurance provider to explore transportation benefits] Intervention Not Indicated Intervention Not Indicated  Utilities Interventions  Intervention Not Indicated Intervention Not Indicated --  Alcohol Usage Interventions -- Intervention Not Indicated (Score <7) --  Depression Interventions/Treatment  -- -- Counseling  Financial Strain Interventions -- Intervention Not Indicated Intervention Not Indicated  Physical Activity Interventions -- Intervention Not Indicated Patient Refused  Stress Interventions -- Intervention Not Indicated Offered [employer], Provide Counseling  Social Connections Interventions -- Intervention Not Indicated Intervention Not Indicated, Patient Refused  Health Literacy Interventions -- Intervention Not Indicated --        Goals Addressed             This Visit's Progress    TOC 30-day Program Care Plan   On track    Current Barriers:  Provider appointments confirmed patient has scheduled appointments for hospital follow up with PCP on 01/20/24 and new patient appointments with AF clinic on 02/09/24 and cardiology provider on 02/20/24- with plans to attend as scheduled Home Health services confirmed [facility] PT order placed post-hospital discharge: patient understands they will contact her within 48 hours of discharge to schedule initial visit; confirmed patient has phone number for [facility]: [phone]- she and her niece will call if they do not hear from agency- patient understands she can also contact me directly should she need assistance or have concerns/ questions around initiation of home health services   Transportation drives self at baseline and has friend who assists as indicated: she is planning to call her insurance provider to explore options for transportation- she verbalizes very good understanding of how to contact insurance provider and denies need for assistance in completing this task 01/24/24: confirmed used insurance transportation benefit for hospital follow up PCP office visit on 01/24/24: "  I will never use that benefit again-- it was a terrible experience;"  reports she plans to resume driving herself as of tomorrow for endocrinology provider office visit Independent in self care at baseline; resides alone; 1 unplanned hospital admission x last 12 months  RNCM Clinical Goal(s):  Patient will work with the Care Management team over the next 30 days to address Transition of Care Barriers: Provider appointments Home Health services Transportation take all medications exactly as prescribed and will call provider for medication related questions as evidenced by review of same during TOC 30-day program weekly outreaches with RN CM attend all scheduled medical appointments: as noted above- in barriers as evidenced by review of same/ collaboration with providers as indicated  through collaboration with Medical illustrator, provider, and care team during weekly TOC 30-day outreaches   Interventions: Evaluation of current treatment plan related to  self management and patient's adherence to plan as established by provider  Transitions of Care:  Goal on track:  Yes. 02/02/24 Durable Medical Equipment (DME) needs assessed with patient/caregiver Doctor Visits  - discussed the importance of doctor visits Discussed current clinical condition:  "I am still doing great!  No issues-- was able to drive to endocrinology doctor and did fine; she okay's me to get the steroid shots in my knees and I go tomorrow for that; not having any real pain- taking tylenol as needed for minor discomfort; Home health PT still coming and keep saying I am making good progress.  Dr. Elvera Lennox was overall pleased with my blood sugars when I saw her"  Denies clinical concerns and sounds to be in no distress throughout [facility] 30-day program outreach call today Reviewed endocrinology provider office visit on 01/25/24: verbalizes good understanding of same; denies questions/ concerns post-recent provider office visits; able to verbalize insulin dosing guidelines without prompting; continues monitoring blood  sugars using CGM- [facility] Health PT services- confirmed active; reviewed last visit on Tuesday of this week; patient states "it's going fine, they say I am progressing weel;" encouraged patient's ongoing active participation/ engagement  Confirmed continues to use assistive devices on regular basis, at baseline -- rollator walker; provided education/ reinforcement around fall prevention  Confirmed she mailed in heart monitor from home monitoring as instructed- planning to attend new patient appointment at [facility] on 02/09/24- provided specific directions to temporary AF Clinic- currently at main hospital at [facility] campus Reinforced previously provided education around signs/ symptoms AF, along with corresponding action plan: patient verbalizes good understanding of same and denies recent signs/ symptoms Reviewed multiple upcoming provider office visits: 02/03/24- knee injections; 02/09/24- AF Clinic provider (new patient); 02/16/24- neurology provider for sleep consult; 02/20/24- cardiology provider (new patient): confirmed patient is aware of all and has plans to attend as scheduled- she again reports she plans on driving self; again states she "will never use" insurance benefit for transportation again; reinforced/ encouraged her to start out for visit with plently of time to spare, to go slow and take her time - she verbalizes agreement and understanding TOC 30-day program initial assessment completed today  Patient Goals/Self-Care Activities: Participate in Transition of Care Program/Attend TOC scheduled calls Take all medications as prescribed Attend all scheduled provider appointments Call provider office for new concerns or questions  Continue pacing activity as your recuperation from recent surgery continues Continue monitoring and recording your blood sugars at home Use assistive devices as needed to prevent falls Continue working with the home health team that is involved in your  care  Follow Up Plan:  Telephone follow up appointment with care management team member scheduled for:  Thursday 02/02/24 at 11:00 am    Plan for next week call: Ensure home health PT/OT services remain active Review AF Clinic new patient provider office visit 02/09/24 Ensure no medication questions/ concerns Re-review upcoming provider appointments Schedule final outreach for next week: Monday 02/20/24 or Tuesday 02/21/24 if no hospital readmission- send referral to longitudinal RN CM       Plan: Telephone follow up appointment with care management team member scheduled for:   Thursday 02/02/24 at 11:00 am  Total time spent from review to signing of note/ including any care coordination interventions:  40 minutes  Pls call/ message for questions,  Caryl Pina, RN, BSN, CCRN Alumnus RN Care Manager  Transitions of Care  [facility] - [facility] [phone]: direct office

## 2024-02-02 NOTE — Patient Instructions (Signed)
 Visit Information  Thank you for taking time to visit with me today. Please don't hesitate to contact me if I can be of assistance to you before our next scheduled telephone appointment.  Our next appointment is by telephone on Thursday 02/09/24 at 2:00 pm  Please call the care guide team at [phone] if you need to cancel or reschedule your appointment.   Following are the goals we discussed today:  Patient Goals/Self-Care Activities: Participate in Transition of Care Program/Attend TOC scheduled calls Take all medications as prescribed Attend all scheduled provider appointments Call provider office for new concerns or questions  Continue pacing activity as your recuperation from recent surgery continues Continue monitoring and recording your blood sugars at home Use assistive devices as needed to prevent falls Continue working with the home health team that is involved in your care  If you are experiencing a Mental Health or Behavioral Health Crisis or need someone to talk to, please  call the Suicide and Crisis Lifeline: 988 call the [place] National Suicide Prevention Lifeline: 606-766-1180 or TTY: 385-522-5046 TTY 6366103190) to talk to a trained counselor call 1-800-273-TALK (toll free, 24 hour hotline) go to [facility] [address], [place] [phone]) call the [facility] Crisis Line: 629-141-5590 call 911   Patient verbalizes understanding of instructions and care plan provided today and agrees to view in MyChart. Active MyChart status and patient understanding of how to access instructions and care plan via MyChart confirmed with patient.     Caryl Pina, RN, BSN, Media planner  Transitions of Care  [facility] - [facility] [phone]: direct office

## 2024-02-04 ENCOUNTER — Other Ambulatory Visit: Payer: Self-pay | Admitting: Family Medicine

## 2024-02-04 DIAGNOSIS — Z794 Long term (current) use of insulin: Secondary | ICD-10-CM

## 2024-02-06 ENCOUNTER — Other Ambulatory Visit (HOSPITAL_COMMUNITY): Payer: Self-pay

## 2024-02-06 MED ORDER — FREESTYLE LIBRE 3 READER DEVI
0 refills | Status: AC
  Filled 2024-02-06 – 2024-02-26 (×2): qty 1, 90d supply, fill #0
  Filled 2024-04-27 – 2024-05-29 (×2): qty 1, 30d supply, fill #0

## 2024-02-07 DIAGNOSIS — I480 Paroxysmal atrial fibrillation: Secondary | ICD-10-CM | POA: Diagnosis not present

## 2024-02-09 ENCOUNTER — Other Ambulatory Visit: Payer: Self-pay | Admitting: *Deleted

## 2024-02-09 ENCOUNTER — Ambulatory Visit (HOSPITAL_COMMUNITY)
Admit: 2024-02-09 | Discharge: 2024-02-09 | Disposition: A | Discharge status: Home / Self Care | Attending: Physician Assistant | Admitting: Physician Assistant

## 2024-02-09 ENCOUNTER — Encounter (HOSPITAL_COMMUNITY): Payer: Self-pay | Admitting: Physician Assistant

## 2024-02-09 VITALS — BP 134/70 | HR 72 | Ht 62.0 in | Wt 281.0 lb

## 2024-02-09 DIAGNOSIS — I10 Essential (primary) hypertension: Secondary | ICD-10-CM | POA: Insufficient documentation

## 2024-02-09 DIAGNOSIS — Z794 Long term (current) use of insulin: Secondary | ICD-10-CM | POA: Insufficient documentation

## 2024-02-09 DIAGNOSIS — I480 Paroxysmal atrial fibrillation: Secondary | ICD-10-CM | POA: Insufficient documentation

## 2024-02-09 DIAGNOSIS — S301XXS Contusion of abdominal wall, sequela: Secondary | ICD-10-CM | POA: Diagnosis not present

## 2024-02-09 DIAGNOSIS — Z79899 Other long term (current) drug therapy: Secondary | ICD-10-CM | POA: Diagnosis not present

## 2024-02-09 DIAGNOSIS — Z7901 Long term (current) use of anticoagulants: Secondary | ICD-10-CM | POA: Insufficient documentation

## 2024-02-09 DIAGNOSIS — Z6841 Body Mass Index (BMI) 40.0 and over, adult: Secondary | ICD-10-CM | POA: Insufficient documentation

## 2024-02-09 DIAGNOSIS — Z7985 Long-term (current) use of injectable non-insulin antidiabetic drugs: Secondary | ICD-10-CM | POA: Insufficient documentation

## 2024-02-09 DIAGNOSIS — D6869 Other thrombophilia: Secondary | ICD-10-CM | POA: Diagnosis not present

## 2024-02-09 DIAGNOSIS — Z9181 History of falling: Secondary | ICD-10-CM | POA: Diagnosis not present

## 2024-02-09 DIAGNOSIS — W1830XS Fall on same level, unspecified, sequela: Secondary | ICD-10-CM | POA: Insufficient documentation

## 2024-02-09 DIAGNOSIS — Z7984 Long term (current) use of oral hypoglycemic drugs: Secondary | ICD-10-CM | POA: Insufficient documentation

## 2024-02-09 DIAGNOSIS — E669 Obesity, unspecified: Secondary | ICD-10-CM | POA: Insufficient documentation

## 2024-02-09 DIAGNOSIS — E785 Hyperlipidemia, unspecified: Secondary | ICD-10-CM | POA: Diagnosis not present

## 2024-02-09 DIAGNOSIS — E119 Type 2 diabetes mellitus without complications: Secondary | ICD-10-CM | POA: Insufficient documentation

## 2024-02-09 MED ORDER — APIXABAN 5 MG PO TABS: 5.0000 mg | ORAL_TABLET | Freq: Two times a day (BID) | ORAL | 3 refills | Status: DC

## 2024-02-09 MED ORDER — DILTIAZEM HCL ER COATED BEADS 180 MG PO CP24: 180.0000 mg | ORAL_CAPSULE | Freq: Every day | ORAL | 1 refills | Status: DC

## 2024-02-09 NOTE — Transitions of Care (Post Inpatient/ED Visit) (Signed)
 Transition of Care  week # 5/ day # 28  Visit Note  02/09/2024  Name: Andrea Marks MRN: [id number]          DOB: 29-May-1951  Situation: Patient enrolled in [facility] 30-day program. Visit completed with patient by telephone.   HIPAA identifiers x 2 verified  Background:  Recent hospitalization March 3/10-13, 2025:  mechanical fall with LLQ hematoma/ new onset A-Fib with RVR  Initial Transition Care Management Follow-up Telephone Call    Past Medical History:  Diagnosis Date   Allergy    Anxiety    Arthritis    Cataract    Depression    Diabetes mellitus    GERD (gastroesophageal reflux disease)    Hyperlipidemia    Hypertension    Obesity    Assessment:  "I am still doing great!  No issues-- was able to drive to endocrinology doctor and did fine; she okay's me to get the steroid shots in my knees and I go tomorrow for that; not having any real pain- taking tylenol as needed for minor discomfort; Home health PT still coming and keep saying I am making good progress.  Dr. Elvera Lennox was overall pleased with my blood sugars when I saw her"    Denies clinical concerns and sounds to be in no distress throughout [facility] 30-day program outreach call today   Patient Reported Symptoms:  Cognitive Alert and oriented to person, place, and time, Insightful and able to interpret abstract concepts, Normal speech and language skills  Neurological Not assessed, No symptoms reported (not indicated)    HEENT No symptoms reported, Not assessed (not indicated)    Cardiovascular No symptoms reported    Respiratory No symptoms reported Confirmed scheduled to attend sleep study neurological provider appointment on 02/16/23  Endocrine No symptoms reported    Gastrointestinal No symptoms reported, Not assessed (not indicated)    Genitourinary No symptoms reported, Not assessed (not indicated)    Integumentary Other "I am not sure what to call this- it is like a lump under my skin, near  my back that I recently discovered, it is the size of a small grapefruit;" confirmed scheduled to PCP on tomorrow 02/10/24 to have this evaluated  Musculoskeletal Difficulty walking    Psychosocial No symptoms reported, Not assessed (not indicated)     Vitals:   02/09/24 1400  BP: 134/70    Medications Reviewed Today     Reviewed by Michaela Corner, RN (Registered Nurse) on 02/09/24 at 1359  Med List Status: <None>   Medication Order Taking? Sig Documenting Provider Last Dose Status Informant  acetaminophen (TYLENOL) 650 MG CR tablet [id number] No Take 1,300 mg by mouth 2 (two) times daily as needed for pain. For pain [provider] Taking Active Self, Pharmacy Records  apixaban (ELIQUIS) 5 MG TABS tablet [id number]  Take 1 tablet (5 mg total) by mouth 2 (two) times daily. Fenton, Clint R, PA  Active   atorvastatin (LIPITOR) 20 MG tablet [id number] No Take 1 tablet (20 mg total) by mouth at bedtime. Hughie Closs, MD Taking Active   benazepril (LOTENSIN) 20 MG tablet [id number] No Take 1 tablet (20 mg total) by mouth daily. Hughie Closs, MD Taking Active   Continuous Glucose Receiver (FREESTYLE LIBRE 3 READER) [place] [id number] No As directed Donato Schultz, DO Taking Active   Continuous Glucose Sensor (FREESTYLE LIBRE 3 SENSOR) [place] [id number] No Place 1 sensor on the skin every 14 days. Use to check glucose continuously [employer] ,  Grayling Congress, DO Taking Active   diltiazem (CARDIZEM CD) 180 MG 24 hr capsule [id number]  Take 1 capsule (180 mg total) by mouth daily. Fenton, Clint R, PA  Active   estradiol (ESTRACE) 1 MG tablet [id number] No Take 1 tablet (1 mg total) by mouth daily. Donato Schultz, DO Taking Active Self, Pharmacy Records  ezetimibe (ZETIA) 10 MG tablet [id number] No Take 1 tablet (10 mg total) by mouth daily. Hughie Closs, MD Taking Active   fenofibrate 160 MG tablet [id number] No TAKE 1 TABLET DAILY Zola Button, Grayling Congress, DO Taking Active Self, Pharmacy Records   FLUoxetine (PROZAC) 20 MG tablet [id number] No Take 3 tablets (60 mg total) by mouth daily. Donato Schultz, DO Taking Active Self, Pharmacy Records  insulin aspart (NOVOLOG FLEXPEN) 100 UNIT/ML FlexPen [id number] No Inject 12-15 Units into the skin 3 (three) times daily with meals. INJECT 12 TO 15 UNITS SUBCUTANEOUSLY 3 TIMES A   DAY WITH MEALS Carlus Pavlov, MD Taking Active Self, Pharmacy Records  Insulin Glargine [facility]) 100 UNIT/ML [id number] No Inject 30 Units into the skin daily. [provider] Taking Active   ipratropium-albuterol (DUONEB) 0.5-2.5 (3) MG/3ML nebulizer solution 3 mL [id number]   Copland, Gwenlyn Found, MD  Active   metFORMIN (GLUCOPHAGE) 850 MG tablet [id number] No Take 1 tablet in the AM, and 2 tablets in the PM. Hughie Closs, MD Taking Active   omeprazole (PRILOSEC) 20 MG capsule [id number] No Take 1 capsule (20 mg total) by mouth daily. Donato Schultz, DO Taking Active            Med Note Michaela Corner   Tue Jan 24, 2024 11:13 AM) 01/24/24: Reports during [facility] 30-day program, has not yet heard from outpatient pharmacy that it is ready for pick up  OZEMPIC, 2 MG/DOSE, 8 MG/3ML SOPN [id number] No INJECT 2MG  SUBCUTANEOUSLY  ONCE WEEKLY (EVERY 7 DAYS) AS DIRECTED Carlus Pavlov, MD Taking Active Self, Pharmacy Records           Med Note Nedra Hai, NICOLE   Mon Jan 09, 2024  1:54 AM) Inject on Sunday  tobramycin (TOBREX) 0.3 % ophthalmic solution [id number] No Place 1 drop into both eyes 4 (four) times daily. Use eye solution for 3 days after eye injections [provider] Taking Active Self, Pharmacy Records           Med Note Michaela Corner   Fri Jan 13, 2024 10:37 AM) 01/13/24: Reports during TOC call- she uses around her eye injections every 9 weeks           Recommendation:   PCP Follow-up Continue established plan of care for [facility] 30-day program  Follow Up Plan:   Telephone follow-up in 1 week  Plan for next week  call: Review PCP office visit 02/10/24- new lump under skin Ensure obtained/ taking Eliquis/ stopped ASA Ensure home health PT/OT services remain active Ensure no medication questions/ concerns Re-review upcoming provider appointments TOC program case closure 02/14/24 if no hospital readmission- send referral to longitudinal RN CM  Total time spent from review to signing of note/ including any care coordination interventions:  61 minutes  Pls call/ message for questions,  Caryl Pina, RN, BSN, CCRN Alumnus RN Care Manager  Transitions of Care  [facility] - [facility] [phone]: direct office

## 2024-02-09 NOTE — Progress Notes (Signed)
 Primary Care Physician: Zola Button, Grayling Congress, DO Primary Cardiologist: Maisie Fus, MD Electrophysiologist: None  Referring Physician: Dr Mariane Baumgarten Andrea Marks is a 73 y.o. female with a history of DM, HLD, HTN, atrial fibrillation who presents for follow up in the [facility].  The patient was initially diagnosed with atrial fibrillation 01/08/24 after presenting to the ED after a mechanical fall in her kitchen. EMS was called and she was found to be in afib with RVR. She was transported to the ED and started on IV diltiazem which converted her to SR. Patient was not started on stroke prevention at that time due to a very large hematoma on her left flank. A two week cardiac monitor was ordered which showed 40% afib burden.   Patient presents today for follow up for atrial fibrillation. She remains in SR today. She was completely unaware of her arrhythmia. Her hematoma is still large but unchanged. Bruising has resolved. She is scheduled for a sleep consult next week. She denies alcohol use.   Today, she denies symptoms of palpitations, chest pain, shortness of breath, orthopnea, PND, lower extremity edema, dizziness, presyncope, syncope, bleeding, or neurologic sequela. The patient is tolerating medications without difficulties and is otherwise without complaint today.    Atrial Fibrillation Risk Factors:  she does have symptoms or diagnosis of sleep apnea. she does not have a history of rheumatic fever. she does not have a history of alcohol use. The patient does have a history of early familial atrial fibrillation or other arrhythmias. Mother had afib.   Atrial Fibrillation Management history:  Previous antiarrhythmic drugs: none Previous cardioversions: none  Previous ablations: none Anticoagulation history: none  ROS- All systems are reviewed and negative except as per the HPI above.  Past Medical History:  Diagnosis Date   Allergy     Anxiety    Arthritis    Cataract    Depression    Diabetes mellitus    GERD (gastroesophageal reflux disease)    Hyperlipidemia    Hypertension    Obesity     Current Outpatient Medications  Medication Sig Dispense Refill   acetaminophen (TYLENOL) 650 MG CR tablet Take 1,300 mg by mouth 2 (two) times daily as needed for pain. For pain     aspirin 81 MG tablet Take 81 mg by mouth daily.     atorvastatin (LIPITOR) 20 MG tablet Take 1 tablet (20 mg total) by mouth at bedtime. 30 tablet 0   benazepril (LOTENSIN) 20 MG tablet Take 1 tablet (20 mg total) by mouth daily.     Continuous Glucose Receiver (FREESTYLE LIBRE 3 READER) DEVI As directed 1 each 0   Continuous Glucose Sensor (FREESTYLE LIBRE 3 SENSOR) MISC Place 1 sensor on the skin every 14 days. Use to check glucose continuously 6 each 3   diltiazem (CARDIZEM CD) 120 MG 24 hr capsule Take 1 capsule (120 mg total) by mouth daily. 30 capsule 0   estradiol (ESTRACE) 1 MG tablet Take 1 tablet (1 mg total) by mouth daily. 90 tablet 1   ezetimibe (ZETIA) 10 MG tablet Take 1 tablet (10 mg total) by mouth daily. 30 tablet 0   fenofibrate 160 MG tablet TAKE 1 TABLET DAILY 90 tablet 1   FLUoxetine (PROZAC) 20 MG tablet Take 3 tablets (60 mg total) by mouth daily. 270 tablet 1   insulin aspart (NOVOLOG FLEXPEN) 100 UNIT/ML FlexPen Inject 12-15 Units into the skin 3 (three) times  daily with meals. INJECT 12 TO 15 UNITS SUBCUTANEOUSLY 3 TIMES A   DAY WITH MEALS 45 mL 3   Insulin Glargine (BASAGLAR KWIKPEN) 100 UNIT/ML Inject 30 Units into the skin daily.     metFORMIN (GLUCOPHAGE) 850 MG tablet Take 1 tablet in the AM, and 2 tablets in the PM.     omeprazole (PRILOSEC) 20 MG capsule Take 1 capsule (20 mg total) by mouth daily. 30 capsule 3   OZEMPIC, 2 MG/DOSE, 8 MG/3ML SOPN INJECT 2MG  SUBCUTANEOUSLY  ONCE WEEKLY (EVERY 7 DAYS) AS DIRECTED 9 mL 3   tobramycin (TOBREX) 0.3 % ophthalmic solution Place 1 drop into both eyes 4 (four) times daily.  Use eye solution for 3 days after eye injections     Current Facility-Administered Medications  Medication Dose Route Frequency Provider Last Rate Last Admin   ipratropium-albuterol (DUONEB) 0.5-2.5 (3) MG/3ML nebulizer solution 3 mL  3 mL Nebulization Q6H Copland, Jessica C, MD   3 mL at 11/15/16 1330    Physical Exam: There were no vitals taken for this visit.  GEN: Well nourished, well developed in no acute distress CARDIAC: Regular rate and rhythm, no murmurs, rubs, gallops RESPIRATORY:  Clear to auscultation without rales, wheezing or rhonchi  ABDOMEN: Soft, non-tender, large (softball size) hematoma on left flank, no ecchymosis.  EXTREMITIES:  No edema; No deformity   Wt Readings from Last 3 Encounters:  01/25/24 123.9 kg  01/20/24 125.5 kg  01/08/24 126.1 kg     EKG today demonstrates  SR Vent. rate 72 BPM PR interval 164 ms QRS duration 80 ms QT/QTcB 374/409 ms   Echo 01/09/24 demonstrated   1. Left ventricular ejection fraction, by estimation, is 55 to 60%. The  left ventricle has normal function. The left ventricle has no regional  wall motion abnormalities. There is mild concentric left ventricular  hypertrophy. Left ventricular diastolic parameters were normal.   2. Right ventricular systolic function is normal. The right ventricular  size is normal. Mildly increased right ventricular wall thickness.  Tricuspid regurgitation signal is inadequate for assessing PA pressure.   3. Left atrial size was mildly dilated.   4. A small pericardial effusion is present. The pericardial effusion is  posterior to the left ventricle. There is no evidence of cardiac  tamponade.   5. The mitral valve is normal in structure. No evidence of mitral valve  regurgitation.   6. The aortic valve is tricuspid. Aortic valve regurgitation is not  visualized.   7. The inferior vena cava is normal in size with <50% respiratory  variability, suggesting right atrial pressure of 8 mmHg.     CHA2DS2-VASc Score = 4  The patient's score is based upon: CHF History: 0 HTN History: 1 Diabetes History: 1 Stroke History: 0 Vascular Disease History: 0 Age Score: 1 Gender Score: 1       ASSESSMENT AND PLAN: Paroxysmal Atrial Fibrillation (ICD10:  I48.0) The patient's CHA2DS2-VASc score is 4, indicating a 4.8% annual risk of stroke.   General education about afib provided and questions answered. We also discussed her stroke risk and the risks and benefits of anticoagulation. Cardiac monitor showed 40% afib burden D/w EP, given no new bruising and hematoma not getting larger, OK to start Eliquis 5 mg BID at this time. Stop ASA.  Increase diltiazem to 180 mg daily. We also discussed AAD in the future for rhythm control. She would favor dofetilide admission. She will discuss changing Prozac to another medication at her visit tomorrow.  Secondary Hypercoagulable State (ICD10:  D68.69) The patient is at significant risk for stroke/thromboembolism based upon her CHA2DS2-VASc Score of 4.  Start Apixaban (Eliquis).   Suspected OSA  Sleep consult scheduled for 4/17 The importance of adequate treatment of sleep apnea was discussed today in order to improve our ability to maintain sinus rhythm long term  HTN Stable on current regimen  Obesity Body mass index is 51.4 kg/m.  Encouraged lifestyle modification   Follow up in the AF clinic in one month.        Jorja Loa PA-C Afib Clinic [facility] [address] [place], [place] [address] [phone]

## 2024-02-09 NOTE — Patient Instructions (Signed)
 Stop aspirin  Start Eliquis 5mg  twice a day   Increase cardizem to 180mg  once a day

## 2024-02-10 ENCOUNTER — Ambulatory Visit (HOSPITAL_BASED_OUTPATIENT_CLINIC_OR_DEPARTMENT_OTHER)
Admission: RE | Admit: 2024-02-10 | Discharge: 2024-02-10 | Disposition: A | Discharge status: Home / Self Care | Source: Ambulatory Visit | Attending: Family Medicine | Admitting: Family Medicine

## 2024-02-10 ENCOUNTER — Encounter: Payer: Self-pay | Admitting: Family Medicine

## 2024-02-10 ENCOUNTER — Ambulatory Visit (INDEPENDENT_AMBULATORY_CARE_PROVIDER_SITE_OTHER): Admitting: Family Medicine

## 2024-02-10 ENCOUNTER — Other Ambulatory Visit (HOSPITAL_COMMUNITY): Payer: Self-pay

## 2024-02-10 VITALS — BP 168/62 | HR 74 | Temp 97.9°F | Resp 18 | Ht 63.0 in | Wt 284.8 lb

## 2024-02-10 DIAGNOSIS — R1900 Intra-abdominal and pelvic swelling, mass and lump, unspecified site: Secondary | ICD-10-CM | POA: Diagnosis present

## 2024-02-10 DIAGNOSIS — N281 Cyst of kidney, acquired: Secondary | ICD-10-CM | POA: Diagnosis not present

## 2024-02-10 DIAGNOSIS — F418 Other specified anxiety disorders: Secondary | ICD-10-CM | POA: Diagnosis not present

## 2024-02-10 DIAGNOSIS — N2889 Other specified disorders of kidney and ureter: Secondary | ICD-10-CM

## 2024-02-10 MED ORDER — DULOXETINE HCL 30 MG PO CPEP: 30.0000 mg | ORAL_CAPSULE | Freq: Every day | ORAL | 0 refills | Status: DC

## 2024-02-10 MED ORDER — DULOXETINE HCL 60 MG PO CPEP: ORAL_CAPSULE | ORAL | 3 refills | Status: DC

## 2024-02-10 NOTE — Progress Notes (Signed)
 5   Established Patient Office Visit  Subjective   Patient ID: Andrea Marks, female    DOB: 09/10/1951  Age: 73 y.o. MRN: [id number]  Chief Complaint  Patient presents with   Mass    Lum on back    HPI Discussed the use of AI scribe software for clinical note transcription with the patient, who gave verbal consent to proceed.  History of Present Illness Andrea Marks is a 73 year old female with atrial fibrillation who presents with persistent pain and swelling in the left flank following a fall.  She has been experiencing persistent pain and swelling in the left flank since a fall on January 08, 2024. The area is described as 'hard as a rock' with discomfort radiating up the arm and under the armpit, especially when stretching or bending. The pain worsens with sitting and driving, causing significant discomfort. Swelling was noticed immediately after the fall, accompanied by significant bruising. An emergency room evaluation revealed a large hematoma on the left kidney. Despite initial treatment, symptoms have persisted, prompting further evaluation. A therapist recommended further medical evaluation due to persistent symptoms. The area does not hurt to touch, but pressure from the swelling causes discomfort.  Regarding atrial fibrillation, she was recently seen at an AFib clinic where a heart monitor indicated she was in AFib 40% of the time. Her medication regimen was adjusted, including starting Cardizem 180 mg daily and planning to initiate Eliquis twice daily. She discussed transitioning from Prozac to Cymbalta due to potential drug interactions with her current treatment plan.   Patient Active Problem List   Diagnosis Date Noted   Hypercoagulable state due to paroxysmal atrial fibrillation (HCC) 02/09/2024   Atrial fibrillation, rapid (HCC) 01/09/2024   Atrial fibrillation (HCC) 01/09/2024   Persistent atrial fibrillation (HCC) 01/09/2024   Depression with  anxiety 10/01/2021   Insomnia 10/01/2021   Vision loss of right eye    Nonintractable headache    Papilledema 02/26/2021   Degenerative arthritis of knee, bilateral 10/30/2020   Degenerative arthritis of right knee 12/25/2018   Fatigue 12/15/2018   Hyperlipidemia associated with type 2 diabetes mellitus (HCC) 12/15/2018   B12 deficiency 12/15/2018   AC (acromioclavicular) joint arthritis 06/20/2018   Left rotator cuff tear 05/29/2018   Hyperlipidemia LDL goal <70 12/19/2017   Morbid obesity (HCC) 11/15/2016   Type 2 diabetes mellitus with hyperglycemia, with long-term current use of insulin (HCC) 10/23/2015   Post-traumatic wound infection 04/17/2013   Thrombophlebitis leg 02/23/2011   TINEA CORPORIS 12/30/2008   Depression, major, single episode, moderate (HCC) 12/14/2006   Essential hypertension 12/14/2006   GERD 12/14/2006   Past Medical History:  Diagnosis Date   Allergy    Anxiety    Arthritis    Cataract    Depression    Diabetes mellitus    GERD (gastroesophageal reflux disease)    Hyperlipidemia    Hypertension    Obesity    Past Surgical History:  Procedure Laterality Date   ABDOMINAL HYSTERECTOMY  11/01/1989   BSO   EYE SURGERY     IR ABLATE LIVER CRYOABLATION  12/31/2021   IR RADIOLOGIST EVAL & MGMT  12/11/2021   IR RADIOLOGIST EVAL & MGMT  01/07/2022   IR RADIOLOGIST EVAL & MGMT  02/15/2022   Social History   Tobacco Use   Smoking status: Former    Current packs/day: 0.00    Types: Cigarettes    Quit date: 1995    Years since quitting: 30.2  Passive exposure: Past   Smokeless tobacco: Never   Tobacco comments:    Former smoker 02/09/24  Vaping Use   Vaping status: Never Used  Substance Use Topics   Alcohol use: No   Drug use: No   Social History   Socioeconomic History   Marital status: Widowed    Spouse name: Not on file   Number of children: 0   Years of education: 57   Highest education level: 12th grade  Occupational History    Occupation: retired from IT consultant: RETIRED  Tobacco Use   Smoking status: Former    Current packs/day: 0.00    Types: Cigarettes    Quit date: 1995    Years since quitting: 30.2    Passive exposure: Past   Smokeless tobacco: Never   Tobacco comments:    Former smoker 02/09/24  Vaping Use   Vaping status: Never Used  Substance and Sexual Activity   Alcohol use: No   Drug use: No   Sexual activity: Not Currently    Partners: Male  Other Topics Concern   Not on file  Social History Narrative   Lives with husband   Right Handed   Drinks 3-5 cups caffeine daily   Social Drivers of Health   Financial Resource Strain: Low Risk  (10/17/2023)   Overall Financial Resource Strain (CARDIA)    Difficulty of Paying Living Expenses: Not hard at all  Food Insecurity: No Food Insecurity (01/13/2024)   Hunger Vital Sign    Worried About Running Out of Food in the Last Year: Never true    Ran Out of Food in the Last Year: Never true  Transportation Needs: No Transportation Needs (01/13/2024)   PRAPARE - Administrator, Civil Service (Medical): No    Lack of Transportation (Non-Medical): No  Physical Activity: Inactive (10/17/2023)   Exercise Vital Sign    Days of Exercise per Week: 0 days    Minutes of Exercise per Session: 0 min  Stress: Stress Concern Present (10/17/2023)   [employer] of Occupational Health - Occupational Stress Questionnaire    Feeling of Stress : To some extent  Social Connections: Socially Isolated (01/09/2024)   Social Connection and Isolation Panel [NHANES]    Frequency of Communication with Friends and Family: More than three times a week    Frequency of Social Gatherings with Friends and Family: Never    Attends Religious Services: Never    Database administrator or Organizations: No    Attends Banker Meetings: Never    Marital Status: Widowed  Intimate Partner Violence: Not At Risk (01/13/2024)   Humiliation,  Afraid, Rape, and Kick questionnaire    Fear of Current or Ex-Partner: No    Emotionally Abused: No    Physically Abused: No    Sexually Abused: No   Family Status  Relation Name Status   Mother  Deceased at age 9   Father  Deceased   Mat Aunt  Deceased   Pat Uncle burney Alive   Pat Uncle sally Deceased   Pat Uncle jerry Deceased   Pat Uncle  Deceased   Pat Uncle  Deceased   Mat Aunt  Alive   Other  (Not Specified)   PGM  (Not Specified)   Other  (Not Specified)  No partnership data on file   Family History  Problem Relation Age of Onset   Macular degeneration Mother    Heart disease Mother  syncope   Aortic stenosis Mother    Lung disease Father 56       mesothelioma   Cancer Maternal Aunt        stomach   Heart disease Paternal Uncle        cabg   Diabetes Paternal Uncle    Heart disease Paternal Uncle    Diabetes Paternal Uncle    Diabetes Paternal Uncle    Heart disease Paternal Uncle    Heart disease Paternal Uncle    Heart disease Paternal Uncle    Lung cancer Other        asbestos   Diabetes Paternal Grandmother    Cancer Other        lung   Allergies  Allergen Reactions   Apple Other (See Comments)    Unable to digest the apple peel.   Hydrocodone-Acetaminophen Other (See Comments)    unknown   Oxycodone Hcl Other (See Comments)    unknown   Penicillins Other (See Comments)    unknown   Prednisone     Patient has noted allergy to prednisone but she says it was a questionable history of mild nausea with prednisone and nothing more       Review of Systems  Constitutional:  Negative for chills, fever and malaise/fatigue.  HENT:  Negative for congestion and hearing loss.   Eyes:  Negative for blurred vision and discharge.  Respiratory:  Negative for cough, sputum production and shortness of breath.   Cardiovascular:  Negative for chest pain, palpitations and leg swelling.  Gastrointestinal:  Positive for abdominal pain. Negative for  blood in stool, constipation, diarrhea, heartburn, nausea and vomiting.  Genitourinary:  Negative for dysuria, frequency, hematuria and urgency.  Musculoskeletal:  Negative for back pain, falls and myalgias.  Skin:  Negative for rash.  Neurological:  Negative for dizziness, sensory change, loss of consciousness, weakness and headaches.  Endo/Heme/Allergies:  Negative for environmental allergies. Does not bruise/bleed easily.  Psychiatric/Behavioral:  Negative for depression and suicidal ideas. The patient is not nervous/anxious and does not have insomnia.       Objective:     BP (!) 168/62 (BP Location: Right Arm, Patient Position: Sitting, Cuff Size: Normal)   Pulse 74   Temp 97.9 F (36.6 C) (Oral)   Resp 18   Ht 5\' 3"  (1.6 m)   Wt 284 lb 12.8 oz (129.2 kg)   SpO2 98%   BMI 50.45 kg/m  BP Readings from Last 3 Encounters:  02/10/24 (!) 168/62  02/09/24 134/70  02/09/24 134/70   Wt Readings from Last 3 Encounters:  02/10/24 284 lb 12.8 oz (129.2 kg)  02/09/24 281 lb (127.5 kg)  02/09/24 281 lb (127.5 kg)   SpO2 Readings from Last 3 Encounters:  02/10/24 98%  01/25/24 99%  01/20/24 97%      Physical Exam Vitals and nursing note reviewed.  Constitutional:      General: She is not in acute distress.    Appearance: Normal appearance. She is well-developed.  HENT:     Head: Normocephalic and atraumatic.  Eyes:     General: No scleral icterus.       Right eye: No discharge.        Left eye: No discharge.  Cardiovascular:     Rate and Rhythm: Normal rate and regular rhythm.     Heart sounds: No murmur heard. Pulmonary:     Effort: Pulmonary effort is normal. No respiratory distress.     Breath sounds:  Normal breath sounds.  Abdominal:     Palpations: There is mass.  Musculoskeletal:        General: Swelling and tenderness present. Normal range of motion.       Arms:     Cervical back: Normal range of motion and neck supple.     Right lower leg: No edema.      Left lower leg: No edema.     Comments: Mass L flank Tender with movement only  Skin:    General: Skin is warm and dry.  Neurological:     Mental Status: She is alert and oriented to person, place, and time.  Psychiatric:        Mood and Affect: Mood normal.        Behavior: Behavior normal.        Thought Content: Thought content normal.        Judgment: Judgment normal.     No results found for any visits on 02/10/24.  Last CBC Lab Results  Component Value Date   WBC 8.5 01/20/2024   HGB 11.9 (L) 01/20/2024   HCT 37.4 01/20/2024   MCV 86.5 01/20/2024   MCH 27.2 01/12/2024   RDW 17.4 (H) 01/20/2024   PLT 247.0 01/20/2024   Last metabolic panel Lab Results  Component Value Date   GLUCOSE 183 (H) 01/20/2024   NA 139 01/20/2024   K 4.5 01/20/2024   CL 102 01/20/2024   CO2 28 01/20/2024   BUN 29 (H) 01/20/2024   CREATININE 0.94 01/20/2024   GFR 60.45 01/20/2024   CALCIUM 9.2 01/20/2024   PHOS 4.0 01/11/2024   PROT 5.9 (L) 01/20/2024   ALBUMIN 3.9 01/20/2024   BILITOT 0.7 01/20/2024   ALKPHOS 30 (L) 01/20/2024   AST 13 01/20/2024   ALT 8 01/20/2024   ANIONGAP 4 (L) 01/12/2024   Last lipids Lab Results  Component Value Date   CHOL 130 10/18/2023   HDL 58.10 10/18/2023   LDLCALC 48 10/18/2023   LDLDIRECT 64.0 04/21/2017   TRIG 120.0 10/18/2023   CHOLHDL 2 10/18/2023   Last hemoglobin A1c Lab Results  Component Value Date   HGBA1C 7.8 (H) 01/09/2024   Last thyroid functions Lab Results  Component Value Date   TSH 3.020 01/09/2024   T3TOTAL 121 01/09/2024   Last vitamin D No results found for: "25OHVITD2", "25OHVITD3", "VD25OH" Last vitamin B12 and Folate Lab Results  Component Value Date   VITAMINB12 136 (L) 12/15/2018      The 10-year ASCVD risk score (Arnett DK, et al., 2019) is: 40%    Assessment & Plan:   Problem List Items Addressed This Visit       Unprioritized   Depression with anxiety   Relevant Medications   DULoxetine  (CYMBALTA) 60 MG capsule   DULoxetine (CYMBALTA) 30 MG capsule   Other Visit Diagnoses       Abdominal wall mass of left flank    -  Primary   Relevant Orders   US Renal (Completed)   CT ABDOMEN PELVIS W CONTRAST     Renal mass       Relevant Orders   Ambulatory referral to Urology     Assessment and Plan Assessment & Plan Left flank mass   She presents with a hard, painful mass in the left flank area since a fall on March 9th, causing discomfort when sitting, driving, or stretching, and extending towards the armpit. A previous CT scan indicated a hematoma on the left kidney,  which may not have reabsorbed or could be masking another issue. Differential diagnosis includes unresolved hematoma, cyst, or other mass. Further imaging is needed to determine the nature of the mass. Order an ultrasound of the left flank to assess the mass. Consider CT or MRI based on ultrasound findings for further evaluation.  Atrial fibrillation   She has atrial fibrillation with recent monitoring showing 40% of the time in AFib. Pain from the flank mass may exacerbate the condition. Medication adjustments include starting Eliquis to replace aspirin and continuing Cardizem. Discussed potential medication interactions with Prozac and the need to switch to Cymbalta to avoid interactions with Tikosyn. Continue Cardizem 180 mg daily. Start Eliquis twice daily, replacing aspirin. Switch from Prozac to Cymbalta to avoid interactions with Tikosyn.  Depression   Currently on Prozac, but due to potential interactions with Tikosyn, a switch to Cymbalta is recommended. Cymbalta will be started at a low dose and titrated up as tolerated. Prozac will be discontinued as Cymbalta is titrated up. Start Cymbalta 30 mg daily for two weeks, then increase to 60 mg as tolerated. Discontinue Prozac as Cymbalta is titrated up.    No follow-ups on file.    Donato Schultz, DO -- +

## 2024-02-11 ENCOUNTER — Other Ambulatory Visit (HOSPITAL_COMMUNITY): Payer: Self-pay

## 2024-02-12 ENCOUNTER — Encounter: Payer: Self-pay | Admitting: Family Medicine

## 2024-02-13 NOTE — Telephone Encounter (Signed)
 Duplicate message.

## 2024-02-14 ENCOUNTER — Other Ambulatory Visit (HOSPITAL_COMMUNITY): Payer: Self-pay

## 2024-02-14 ENCOUNTER — Other Ambulatory Visit (HOSPITAL_BASED_OUTPATIENT_CLINIC_OR_DEPARTMENT_OTHER): Payer: Self-pay | Admitting: Family

## 2024-02-14 ENCOUNTER — Other Ambulatory Visit: Payer: Self-pay | Admitting: *Deleted

## 2024-02-14 DIAGNOSIS — I480 Paroxysmal atrial fibrillation: Secondary | ICD-10-CM

## 2024-02-14 DIAGNOSIS — E782 Mixed hyperlipidemia: Secondary | ICD-10-CM

## 2024-02-14 MED ORDER — ATORVASTATIN CALCIUM 20 MG PO TABS
20.0000 mg | ORAL_TABLET | Freq: Every day | ORAL | 2 refills | Status: DC
Start: 2024-02-14 — End: 2024-02-20

## 2024-02-14 MED ORDER — EZETIMIBE 10 MG PO TABS: 10.0000 mg | ORAL_TABLET | Freq: Every day | ORAL | 2 refills | Status: DC

## 2024-02-14 NOTE — Patient Instructions (Signed)
 Visit Information  Thank you for taking time to visit with me today. Please don't hesitate to contact me if I can be of assistance to you before our next scheduled telephone appointment.  It has been a pleasure working with you over the last 30 days!  Great job managing your care after your hospital visit!  I am glad that you are doing well!  Please listen out for a call from the next nurse that will be calling you in the coming weeks  Following are the goals we discussed today:  Patient Goals/Self-Care Activities: Take all medications as prescribed Attend all scheduled provider appointments Call provider office for new concerns or questions  Continue pacing activity as your recuperation from recent surgery continues Continue monitoring and recording your blood sugars at home Use assistive devices as needed to prevent falls Continue working with the home health team that is involved in your care  If you are experiencing a Mental Health or Behavioral Health Crisis or need someone to talk to, please  call the Suicide and Crisis Lifeline: 988 call the [place]  National Suicide Prevention Lifeline: 580-825-5099 or TTY: 585-772-1426 TTY (434) 323-5938) to talk to a trained counselor call 1-800-273-TALK (toll free, 24 hour hotline) go to [facility] [address], [place] [phone]) call the [facility] Crisis Line: [phone] call 911   Patient verbalizes understanding of instructions and care plan provided today and agrees to view in MyChart. Active MyChart status and patient understanding of how to access instructions and care plan via MyChart confirmed with patient.     Alenah Sarria Mckinney Janiaya Ryser, RN, BSN, Media planner  Transitions of Care  [facility] [phone]: direct office

## 2024-02-14 NOTE — Transitions of Care (Post Inpatient/ED Visit) (Signed)
 Transition of Care  week # 6/ day # 33  Visit Note  02/14/2024  Name: Raynisha Avilla MRN: [id number]          DOB: 02/08/1951  Situation: Patient enrolled in [facility] 30-day program. Visit completed with patient by telephone.   HIPAA identifiers x 2 verified  TOC 30--day outreach completed; patient has successfully met/ accomplished her established goals for TOC 30-day program without hospital readmission and referral was sent for ongoing follow up with longitudinal RN CM   Background:  Recent hospitalization March 3/10-13, 2025: mechanical fall with LLQ hematoma/ new onset A-Fib with RVR  Fragile state of health, multiple progressing chronic health conditions Lives alone; independent; drives self; limited mobility- uses walker   Initial Transition Care Management Follow-up Telephone Call    Past Medical History:  Diagnosis Date   Allergy    Anxiety    Arthritis    Cataract    Depression    Diabetes mellitus    GERD (gastroesophageal reflux disease)    Hyperlipidemia    Hypertension    Obesity    Assessment:  "I am doing fine overall.  Just so much going on.... so many appointments, but I am just taking it one day at a time.  Still using the walker and home health PT and OT are still coming.  I will talk to the cardiologist when I see her next week to determine if she wants me to stay on these cholesterol medications.  I am about out of both of them with no refills; Dr. Crecencio Dodge did not know what the lump is in my back, she is sending me to the urology doctor, and I have an appointment on 02/29/24;"    Denies clinical concerns and sounds to be in no distress throughout [facility] 30-day program outreach call today   Patient Reported Symptoms: Cognitive Cognitive Status: Alert and oriented to person, place, and time, Insightful and able to interpret abstract concepts, Normal speech and language skills Cognitive/Intellectual Conditions Management [RPT]: None reported or  documented in medical history or problem list      Neurological      HEENT HEENT Symptoms Reported: No symptoms reported, Not assessed (not indicated)      Cardiovascular Cardiovascular Symptoms Reported: No symptoms reported Does patient have uncontrolled Hypertension?: No Cardiovascular Conditions: Dysrhythmia, Hypertension Cardiovascular Management Strategies: Medication therapy, Routine screening, Adequate rest, Diet modification  Respiratory Respiratory Symptoms Reported: No symptoms reported Other Respiratory Symptoms: re-confirmed scheduled for sleep consultation/ neurology on 02/16/24- with plans to attend as scheduled Respiratory Conditions: Sleep disordered breathing  Endocrine Patient reports the following symptoms related to hypoglycemia or hyperglycemia : No symptoms reported Is patient diabetic?: Yes Is patient checking blood sugars at home?: Yes Endocrine Conditions: Diabetes Endocrine Management Strategies: Routine screening, Medication therapy, Medical device, Adequate rest, Diet modification  Gastrointestinal Gastrointestinal Symptoms Reported: No symptoms reported, Not assessed (not indicated)      Genitourinary   Genitourinary Conditions: Other Other Genitourinary Conditions: new "lump" on flank- CT scan scheduled 02/17/24; urology provider appointment on 02/29/24  Integumentary Integumentary Symptoms Reported: No symptoms reported, Not assessed (not indicated)    Musculoskeletal Musculoskelatal Symptoms Reviewed: Difficulty walking, Unsteady gait Additional Musculoskeletal Details: confirmed continues using walker "all the time;" gets knee injections/ steroids- reports "helping" denies pain today Musculoskeletal Conditions: Mobility limited Musculoskeletal Management Strategies: Medical device, Routine screening, Coping strategies      Psychosocial Psychosocial Symptoms Reported: No symptoms reported Additional Psychological Details: confirmed has stopped taking  prozac  and has transitioned to Cymablta as instructed on 02/10/24 PCP office visit         There were no vitals filed for this visit.  Medications Reviewed Today     Reviewed by Oran Dillenburg M, RN (Registered Nurse) on 02/14/24 at 1313  Med List Status: <None>   Medication Order Taking? Sig Documenting Provider Last Dose Status Informant  acetaminophen (TYLENOL) 650 MG CR tablet [id number] Yes Take 1,300 mg by mouth 2 (two) times daily as needed for pain. For pain [provider] Taking Active Self, Pharmacy Records  apixaban (ELIQUIS) 5 MG TABS tablet [id number] Yes Take 1 tablet (5 mg total) by mouth 2 (two) times daily. Fenton, Clint R, PA Taking Active   atorvastatin (LIPITOR) 20 MG tablet [id number]  Take 1 tablet (20 mg total) by mouth at bedtime. Modena Andes, MD  Expired 02/11/24 2359   benazepril (LOTENSIN) 20 MG tablet [id number] Yes Take 1 tablet (20 mg total) by mouth daily. Modena Andes, MD Taking Active   Continuous Glucose Receiver (FREESTYLE LIBRE 3 READER) [place]VI [id number] Yes As directed Estill Hemming, DO Taking Active   Continuous Glucose Sensor (FREESTYLE LIBRE 3 SENSOR) [place] [id number] Yes Place 1 sensor on the skin every 14 days. Use to check glucose continuously Crecencio Dodge, Candida Chalk, DO Taking Active   diltiazem (CARDIZEM CD) 180 MG 24 hr capsule [id number] Yes Take 1 capsule (180 mg total) by mouth daily. Fenton, Clint R, PA Taking Active   DULoxetine (CYMBALTA) 30 MG capsule [id number] Yes Take 1 capsule (30 mg total) by mouth daily. Crecencio Dodge, Adel Holt R, DO Taking Active   DULoxetine (CYMBALTA) 60 MG capsule [id number] Yes 1 po every day --- start after you finish 30 mg Crecencio Dodge, Yvonne R, DO Taking Active   estradiol (ESTRACE) 1 MG tablet [id number] Yes Take 1 tablet (1 mg total) by mouth daily. Estill Hemming, DO Taking Active Self, Pharmacy Records  ezetimibe (ZETIA) 10 MG tablet [id number]  Take 1 tablet (10 mg total) by mouth daily. Modena Andes, MD  Expired 02/11/24 2359   fenofibrate 160 MG tablet [id number] Yes TAKE 1 TABLET DAILY Crecencio Dodge, Candida Chalk, DO Taking Active Self, Pharmacy Records  FLUoxetine (PROZAC) 20 MG tablet [id number] No Take 3 tablets (60 mg total) by mouth daily.  Patient not taking: Reported on 02/14/2024   Estill Hemming, DO Not Taking Active Self, Pharmacy Records           Med Note (Nikko Goldwire M   Tue Feb 14, 2024  1:10 PM) 02/14/24: Reports during [facility] call-- this was discontinued at time of 02/10/24 PCP office visit: verified this is correct- patient is NOT currently taking; is taking cymbalta instead  insulin aspart (NOVOLOG FLEXPEN) 100 UNIT/ML FlexPen [id number] Yes Inject 12-15 Units into the skin 3 (three) times daily with meals. INJECT 12 TO 15 UNITS SUBCUTANEOUSLY 3 TIMES A   DAY WITH MEALS Emilie Harden, MD Taking Active Self, Pharmacy Records  Insulin Glargine [facility]) 100 UNIT/ML [id number] Yes Inject 30 Units into the skin daily. [provider] Taking Active   ipratropium-albuterol (DUONEB) 0.5-2.5 (3) MG/3ML nebulizer solution 3 mL [id number]   Copland, Skipper Dumas, MD  Active   metFORMIN (GLUCOPHAGE) 850 MG tablet [id number] Yes Take 1 tablet in the AM, and 2 tablets in the PM. Modena Andes, MD Taking Active   omeprazole (PRILOSEC) 20 MG capsule [id number] Yes Take 1 capsule (20 mg total) by mouth daily. Lowne  Verena Glaser, DO Taking Active            Med Note (Cristi Gwynn M   Tue Feb 14, 2024  1:13 PM) 02/14/24: Reports during [facility] call, now taking OTC medication- reports insurance company would not cover the Rx   OZEMPIC, 2 MG/DOSE, 8 MG/3ML SOPN [id number] Yes INJECT 2MG  SUBCUTANEOUSLY  ONCE WEEKLY (EVERY 7 DAYS) AS DIRECTED Emilie Harden, MD Taking Active Self, Pharmacy Records           Med Note Merlyn Starring, NICOLE   Mon Jan 09, 2024  1:54 AM) Inject on Sunday  tobramycin (TOBREX) 0.3 % ophthalmic solution [id number] Yes Place 1 drop into both eyes 4 (four) times  daily. Use eye solution for 3 days after eye injections [provider] Taking Active Self, Pharmacy Records           Med Note (Travus Oren M   Fri Jan 13, 2024 10:37 AM) 01/13/24: Reports during TOC call- she uses around her eye injections every 9 weeks           Recommendation:   PCP Follow-up Specialty provider follow-up neuro- sleep provider; cardiology; urology providers: all as currently scheduled and in [facility] care plan Diagnostic requests: CT- abdomen/ pelvis- as scheduled 02/17/24 Care coordination outreach to cardiology provider: patient was prescribed atorvastatin and ezetimibe post-hospital discharge- has no refills- made cardiology provider aware that new Rx will need to be sent to outpatient pharmacy if provider wishes patient to remain on these medications--- confirmed patient plans to attend cardiology provider office visit as scheduled 02/20/24 Referral to: longitudinal RN CM for ongoing follow up - TOC 30-day case closure 02/14/24  Follow Up Plan:   Referral to RN Case Manager  Total time spent from review to signing of note/ including any care coordination interventions:  65 minutes  Pls call/ message for questions,  Erlene Hawks, RN, BSN, CCRN Alumnus RN Care Manager  Transitions of Care  [facility] - [facility] [phone]: direct office

## 2024-02-15 ENCOUNTER — Telehealth: Payer: Self-pay | Admitting: Urology

## 2024-02-15 NOTE — Telephone Encounter (Signed)
 called and lvm to r/s apt due to provider being out of the office ANN 02/15/24

## 2024-02-16 ENCOUNTER — Telehealth: Payer: Self-pay | Admitting: Family Medicine

## 2024-02-16 ENCOUNTER — Other Ambulatory Visit (HOSPITAL_COMMUNITY): Payer: Self-pay

## 2024-02-16 ENCOUNTER — Ambulatory Visit (INDEPENDENT_AMBULATORY_CARE_PROVIDER_SITE_OTHER): Admitting: Neurology

## 2024-02-16 ENCOUNTER — Telehealth: Payer: Self-pay

## 2024-02-16 ENCOUNTER — Encounter: Payer: Self-pay | Admitting: Neurology

## 2024-02-16 VITALS — BP 136/73 | HR 75 | Ht 63.0 in | Wt 277.8 lb

## 2024-02-16 DIAGNOSIS — G4719 Other hypersomnia: Secondary | ICD-10-CM

## 2024-02-16 DIAGNOSIS — R0683 Snoring: Secondary | ICD-10-CM

## 2024-02-16 DIAGNOSIS — R600 Localized edema: Secondary | ICD-10-CM

## 2024-02-16 DIAGNOSIS — I480 Paroxysmal atrial fibrillation: Secondary | ICD-10-CM | POA: Diagnosis not present

## 2024-02-16 DIAGNOSIS — R519 Headache, unspecified: Secondary | ICD-10-CM

## 2024-02-16 DIAGNOSIS — R351 Nocturia: Secondary | ICD-10-CM

## 2024-02-16 DIAGNOSIS — Z6841 Body Mass Index (BMI) 40.0 and over, adult: Secondary | ICD-10-CM

## 2024-02-16 DIAGNOSIS — I499 Cardiac arrhythmia, unspecified: Secondary | ICD-10-CM

## 2024-02-16 DIAGNOSIS — Z9189 Other specified personal risk factors, not elsewhere classified: Secondary | ICD-10-CM

## 2024-02-16 NOTE — Telephone Encounter (Signed)
 Copied from CRM [id number]. Topic: General - Other >> Feb 16, 2024 12:43 PM Chuck Crater wrote: Reason for CRM: Gordan Latina is calling to check the status of Patient plan of care that was faxed 01/23/2024. She wants to know if it was received.

## 2024-02-16 NOTE — Progress Notes (Signed)
 Subjective:    Patient ID: Andrea Marks is a 73 y.o. female.  HPI    Huston Foley, MD, PhD [facility] [address], [address] [address] [place], [place] [address]  Dear Dr. Zola Button,  I saw your patient, Andrea Marks, upon your kind request in my sleep clinic today for initial consultation of her sleep disorder, in particular, concern for underlying obstructive sleep apnea.  The patient is unaccompanied today.  As you know, Andrea Marks is a 73 year old female with an underlying medical history of vitamin B12 deficiency, reflux disease, diabetes, paroxysmal A-fib, allergies, anxiety, depression, hypertension, hyperlipidemia, and morbid obesity with a BMI of over 45, who reports snoring and excessive daytime somnolence.  Her Epworth sleepiness score is 7 out of 24, fatigue severity score is 63 out of 63.  I reviewed your office note from 01/20/2024.  She reported a recent fall.  She was diagnosed with atrial fibrillation recently during her recent hospitalization.  She was also suspected to have obstructive sleep apnea.  She was found to have lung nodules.  She was not started on anticoagulation due to a large hematoma she had sustained from the fall.  She had conversion to sinus rhythm after diltiazem infusion. She has a CT scan of the abdomen and pelvis scheduled for tomorrow, due to ongoing swelling in the left lower quadrant and flank area.. She recently completed a 2-week heart monitor at the end of March and was found to have A-fib 40% of the time.  She cannot tell if she goes in and out of A-fib.  She has not been sleeping in her bedroom since she came home from the hospital as she has to navigate stairs and this is hard for her.  She is in home health PT currently and they are going to work on stairs.  She has been sleeping in her den in a recliner.  In her bedroom she has an adjustable bed and typically has it elevated mildly, less than 45  degrees.  She has nocturia about twice per average at night, has had occasional morning headaches.  She has a history of snoring but denies any gasping sensations, had a tonsillectomy as a child, is not aware of any family history of sleep apnea but her mom did have a sleep study in the past.  She is worried about not being able to sleep with something on her face.  She was treated with oxygen while she was hospitalized.  Bedtime varies currently, but generally she tries to be in bed between 9 and 10 and rise time is between 7 and 8.  She does have a TV on in her bedroom typically.  She drinks caffeine in the form of coffee, 1 or 2 cups/day, she quit smoking about 30 years ago.  She does not currently drink any alcohol.  She has been on Eliquis. She is widowed, her husband passed away about 2 years ago.  She lives alone.  She has no children.  No pets in the household.  Her Past Medical History Is Significant For: Past Medical History:  Diagnosis Date   Allergy    Anxiety    Arthritis    Cataract    Depression    Diabetes mellitus    GERD (gastroesophageal reflux disease)    Hyperlipidemia    Hypertension    Obesity     Her Past Surgical History Is Significant For: Past Surgical History:  Procedure Laterality Date   ABDOMINAL  HYSTERECTOMY  11/01/1989   BSO   EYE SURGERY     IR ABLATE LIVER CRYOABLATION  12/31/2021   IR RADIOLOGIST EVAL & MGMT  12/11/2021   IR RADIOLOGIST EVAL & MGMT  01/07/2022   IR RADIOLOGIST EVAL & MGMT  02/15/2022    Her Family History Is Significant For: Family History  Problem Relation Age of Onset   Macular degeneration Mother    Heart disease Mother        syncope   Aortic stenosis Mother    Lung disease Father 58       mesothelioma   Cancer Maternal Aunt        stomach   Heart disease Paternal Uncle        cabg   Diabetes Paternal Uncle    Heart disease Paternal Uncle    Diabetes Paternal Uncle    Diabetes Paternal Uncle    Heart disease  Paternal Uncle    Heart disease Paternal Uncle    Heart disease Paternal Uncle    Lung cancer Other        asbestos   Diabetes Paternal Grandmother    Cancer Other        lung    Her Social History Is Significant For: Social History   Socioeconomic History   Marital status: Widowed    Spouse name: Not on file   Number of children: 0   Years of education: 68   Highest education level: 12th grade  Occupational History   Occupation: retired from IT consultant: RETIRED  Tobacco Use   Smoking status: Former    Current packs/day: 0.00    Types: Cigarettes    Quit date: 1995    Years since quitting: 30.3    Passive exposure: Past   Smokeless tobacco: Never   Tobacco comments:    Former smoker 02/09/24  Vaping Use   Vaping status: Never Used  Substance and Sexual Activity   Alcohol use: No   Drug use: No   Sexual activity: Not Currently    Partners: Male  Other Topics Concern   Not on file  Social History Narrative   Lives with husband   Right Handed   Drinks 3-5 cups caffeine daily   Social Drivers of Health   Financial Resource Strain: Low Risk  (10/17/2023)   Overall Financial Resource Strain (CARDIA)    Difficulty of Paying Living Expenses: Not hard at all  Food Insecurity: No Food Insecurity (01/13/2024)   Hunger Vital Sign    Worried About Running Out of Food in the Last Year: Never true    Ran Out of Food in the Last Year: Never true  Transportation Needs: No Transportation Needs (01/13/2024)   PRAPARE - Administrator, Civil Service (Medical): No    Lack of Transportation (Non-Medical): No  Physical Activity: Inactive (10/17/2023)   Exercise Vital Sign    Days of Exercise per Week: 0 days    Minutes of Exercise per Session: 0 min  Stress: Stress Concern Present (10/17/2023)   [employer] of Occupational Health - Occupational Stress Questionnaire    Feeling of Stress : To some extent  Social Connections: Socially Isolated  (01/09/2024)   Social Connection and Isolation Panel [NHANES]    Frequency of Communication with Friends and Family: More than three times a week    Frequency of Social Gatherings with Friends and Family: Never    Attends Religious Services: Never    Active Member  of Clubs or Organizations: No    Attends Banker Meetings: Never    Marital Status: Widowed    Her Allergies Are:  Allergies  Allergen Reactions   Apple Other (See Comments)    Unable to digest the apple peel.   Hydrocodone-Acetaminophen Other (See Comments)    unknown   Oxycodone Hcl Other (See Comments)    unknown   Penicillins Other (See Comments)    unknown   Prednisone     Patient has noted allergy to prednisone but she says it was a questionable history of mild nausea with prednisone and nothing more   :   Her Current Medications Are:  Outpatient Encounter Medications as of 02/16/2024  Medication Sig   acetaminophen (TYLENOL) 650 MG CR tablet Take 1,300 mg by mouth 2 (two) times daily as needed for pain. For pain   apixaban (ELIQUIS) 5 MG TABS tablet Take 1 tablet (5 mg total) by mouth 2 (two) times daily.   atorvastatin (LIPITOR) 20 MG tablet Take 1 tablet (20 mg total) by mouth at bedtime.   benazepril (LOTENSIN) 20 MG tablet Take 1 tablet (20 mg total) by mouth daily.   Continuous Glucose Receiver (FREESTYLE LIBRE 3 READER) DEVI As directed   Continuous Glucose Sensor (FREESTYLE LIBRE 3 SENSOR) MISC Place 1 sensor on the skin every 14 days. Use to check glucose continuously   diltiazem (CARDIZEM CD) 180 MG 24 hr capsule Take 1 capsule (180 mg total) by mouth daily.   DULoxetine (CYMBALTA) 30 MG capsule Take 1 capsule (30 mg total) by mouth daily.   estradiol (ESTRACE) 1 MG tablet Take 1 tablet (1 mg total) by mouth daily.   ezetimibe (ZETIA) 10 MG tablet Take 1 tablet (10 mg total) by mouth daily.   fenofibrate 160 MG tablet TAKE 1 TABLET DAILY   insulin aspart (NOVOLOG FLEXPEN) 100 UNIT/ML  FlexPen Inject 12-15 Units into the skin 3 (three) times daily with meals. INJECT 12 TO 15 UNITS SUBCUTANEOUSLY 3 TIMES A   DAY WITH MEALS   Insulin Glargine (BASAGLAR KWIKPEN) 100 UNIT/ML Inject 30 Units into the skin daily.   metFORMIN (GLUCOPHAGE) 850 MG tablet Take 1 tablet in the AM, and 2 tablets in the PM.   omeprazole (PRILOSEC) 20 MG capsule Take 1 capsule (20 mg total) by mouth daily.   OZEMPIC, 2 MG/DOSE, 8 MG/3ML SOPN INJECT 2MG  SUBCUTANEOUSLY  ONCE WEEKLY (EVERY 7 DAYS) AS DIRECTED   DULoxetine (CYMBALTA) 60 MG capsule 1 po every day --- start after you finish 30 mg (Patient not taking: Reported on 02/16/2024)   FLUoxetine (PROZAC) 20 MG tablet Take 3 tablets (60 mg total) by mouth daily. (Patient not taking: Reported on 02/14/2024)   tobramycin (TOBREX) 0.3 % ophthalmic solution Place 1 drop into both eyes 4 (four) times daily. Use eye solution for 3 days after eye injections (Patient not taking: Reported on 02/16/2024)   Facility-Administered Encounter Medications as of 02/16/2024  Medication   ipratropium-albuterol (DUONEB) 0.5-2.5 (3) MG/3ML nebulizer solution 3 mL  :   Review of Systems:  Out of a complete 14 point review of systems, all are reviewed and negative with the exception of these symptoms as listed below:  Review of Systems  Neurological:        Room 9 Pt is here Alone. Pt states that she was in the hospital for a fall and was told that he has Afib and Sleep Apnea in the hospital. Pt is here to get a sleep  study. ESS 7 FSS 63     Objective:  Neurological Exam  Physical Exam Physical Examination:   Vitals:   02/16/24 1112  BP: 136/73  Pulse: 75    General Examination: The patient is a very pleasant 73 y.o. female in no acute distress. She appears well-developed and well-nourished and well groomed.   HEENT: Normocephalic, atraumatic, pupils are equal, round and reactive to light, extraocular tracking is good without limitation to gaze excursion or  nystagmus noted. Hearing is grossly intact. Face is symmetric with normal facial animation. Speech is clear with no dysarthria noted. There is no hypophonia. There is no lip, neck/head, jaw or voice tremor. Neck is supple with full range of passive and active motion. There are no carotid bruits on auscultation. Oropharynx exam reveals: moderate mouth dryness, adequate dental hygiene and moderate airway crowding, due to small airway entry and redundant soft palate, Mallampati class IV, tonsils absent.  Neck circumference 15-7/8 inches.  Mild to moderate overbite noted.  Tongue protrudes centrally and palate elevates symmetrically.  Chest: Clear to auscultation without wheezing, rhonchi or crackles noted.  Heart: S1+S2+0, irregularly irregular with a mild systolic murmur noted.     Abdomen: Soft, non-tender and non-distended but significant lumpy swelling left lower and lateral abdomen area.  Extremities: There is 2-3+ swelling in both lower extremities, with discoloration noted.    Skin: Warm and dry without trophic changes noted.   Musculoskeletal: exam reveals no obvious joint deformities.   Neurologically:  Mental status: The patient is awake, alert and oriented in all 4 spheres. Her immediate and remote memory, attention, language skills and fund of knowledge are appropriate. There is no evidence of aphasia, agnosia, apraxia or anomia. Speech is clear with normal prosody and enunciation. Thought process is linear. Mood is normal and affect is normal.  Cranial nerves II - XII are as described above under HEENT exam.  Motor exam: Normal bulk, strength and tone is noted. There is no obvious action or resting tremor.  Fine motor skills and coordination: grossly intact.  Cerebellar testing: No dysmetria or intention tremor. There is no truncal or gait ataxia.  Sensory exam: intact to light touch in the upper and lower extremities.  Gait, station and balance: She stands with difficulty and pushes  herself up.  She walks with a rolling walker.  Assessment and Plan:  In summary, Andrea Marks is a very pleasant 73 year old female with an underlying medical history of vitamin B12 deficiency, reflux disease, diabetes, paroxysmal A-fib, allergies, anxiety, depression, hypertension, hyperlipidemia, and morbid obesity with a BMI of over 45, whose history and physical exam are concerning for sleep disordered breathing, particularly obstructive sleep apnea (OSA). A laboratory attended sleep study is typically considered "gold standard" for evaluation of sleep disordered breathing.   I had a long chat with the patient about my findings and the diagnosis of sleep apnea, particularly OSA, its prognosis and treatment options. We talked about medical/conservative treatments, surgical interventions and non-pharmacological approaches for symptom control. I explained, in particular, the risks and ramifications of untreated moderate to severe OSA, especially with respect to developing cardiovascular disease down the road, including congestive heart failure (CHF), difficult to treat hypertension, cardiac arrhythmias (particularly A-fib), neurovascular complications including TIA, stroke and dementia. Even type 2 diabetes has, in part, been linked to untreated OSA. Symptoms of untreated OSA may include (but may not be limited to) daytime sleepiness, nocturia (i.e. frequent nighttime urination), memory problems, mood irritability and suboptimally controlled or worsening mood  disorder such as depression and/or anxiety, lack of energy, lack of motivation, physical discomfort, as well as recurrent headaches, especially morning or nocturnal headaches. We talked about the importance of maintaining a healthy lifestyle and striving for healthy weight.  In addition, we talked about the importance of striving for and maintaining good sleep hygiene. I recommended a sleep study at this time. I outlined the differences  between a laboratory attended sleep study which is considered more comprehensive and accurate over the option of a home sleep test (HST); the latter may lead to underestimation of sleep disordered breathing in some instances and does not help with diagnosing upper airway resistance syndrome and is not accurate enough to diagnose primary central sleep apnea typically. I outlined possible surgical and non-surgical treatment options of OSA, including the use of a positive airway pressure (PAP) device (i.e. CPAP, AutoPAP/APAP or BiPAP in certain circumstances), a custom-made dental device (aka oral appliance, which would require a referral to a specialist dentist or orthodontist typically, and is generally speaking not considered for patients with full dentures or edentulous state), upper airway surgical options, such as traditional UPPP (which is not considered a first-line treatment) or the Inspire device (hypoglossal nerve stimulator, which would involve a referral for consultation with an ENT surgeon, after careful selection, following inclusion criteria - also not first-line treatment). I explained the PAP treatment option to the patient in detail, as this is generally considered first-line treatment.  The patient indicated that she would be reluctant but willing to try PAP therapy, if the need arises. I explained the importance of being compliant with PAP treatment, not only for insurance purposes but primarily to improve patient's symptoms symptoms, and for the patient's long term health benefit, including to reduce Her cardiovascular risks longer-term.    We will pick up our discussion about the next steps and treatment options after testing.  We will keep her posted as to the test results by phone call and/or MyChart messaging where possible.  We will plan to follow-up in sleep clinic accordingly as well.  I answered all her questions today and the patient was in agreement.  I was able to show her a  CPAP/AutoPap machine model during the visit today.  I encouraged her to call with any interim questions, concerns, problems or updates or email us  through MyChart.  Generally speaking, sleep test authorizations may take up to 2 weeks, sometimes less, sometimes longer, the patient is encouraged to get in touch with us  if they do not hear back from the sleep lab staff directly within the next 2 weeks. This was an extended visit of over 60 minutes with copious record review involved and considerable counseling and coordination of care. Thank you very much for allowing me to participate in the care of this nice patient. If I can be of any further assistance to you please do not hesitate to call me at [phone].  Sincerely,   Debbra Fairy, MD, PhD

## 2024-02-16 NOTE — Progress Notes (Signed)
 Cardiology Office Note    Date:  02/20/2024  ID:  Andrea Marks, DOB 03-18-1951, MRN [id number] PCP:  Crecencio Dodge, Candida Chalk, DO  Cardiologist:  Bridgette Campus, MD  Electrophysiologist:  None   Chief Complaint: Follow up for atrial fibrillation   History of Present Illness: .    Andrea Marks is a 73 y.o. female with visit-pertinent history of diabetes, hyperlipidemia, obesity, hypertension, atrial fibrillation.  On 01/09/2024 patient presented to the hospital following a fall and was found to be in new onset paroxysmal atrial fibrillation.  Patient was bending over to pick something up, lost her footing and fell into the [place] or kitchen.  She denied any preceding symptoms that would have led to the cause such as dizziness or lightheadedness.  No loss of consciousness with a negative CT of the head.  CT of the abdomen and pelvis showed a large hematoma in the left lower quadrant extraperitoneal.  She was noted to be in atrial fibrillation on arrival with RVR and started on diltiazem  drip.  Anticoagulation deferred given hematoma.  Patient converted back to sinus rhythm while on diltiazem  drip.  Patient was asymptomatic with no palpitations, chest pain, shortness of breath.  Echocardiogram on 01/09/2024 indicated LVEF of 55 to 60%, no RWMA, mild concentric LVH, diastolic parameters were normal, RV systolic function was normal, mildly increased RV wall thickness, left atrial size was mildly dilated, there is a small pericardial effusion, no evidence of cardiac tamponade, no significant valvular abnormalities.  Cardiac monitor worn for 13 days and 15 hours indicated an average heart rate of 78 bpm, ranging from 54 275 bpm.  Predominant underlying rhythm was sinus rhythm, first-degree AV block was present.  Atrial fibrillation/flutter occurred with a 40% burden ranging from 55 to 175 bpm with an average of 91 bpm.  Longest lasting 1 day and 19 hours with an average rate of 89  bpm.  Isolated PVCs were occasional 1.7% burden.  Patient was seen by A-fib clinic on/08/2024.  She was reportedly completely unaware of her arrhythmia, she remained stable from a cardiac standpoint.  Discussed with EP given no new bruising or and hematoma not enlarging okay to start Eliquis  5 mg twice daily, she was stopped on aspirin.  Her diltiazem  was increased to 180 mg daily.  Today she presents for follow-up.  She reports that she is doing very well overall. She denies chest pain, shortness of breath, lower extremity edema, orthopnea or pnd. She is completely unaware of any palpitations or feelings of atrial fibrillation.  She denies any presyncope or syncope.  She reports that she currently works with physical therapy and Occupational Therapy every week and tolerates very well.  She is currently pending a sleep study with neurology, she is unsure if she wants to proceed with this as she notes she has difficulty with anything that touches her face.  She reports that she is tolerating all of her medications well, she has no cardiac concerns or complaints today.  ROS: .   Today she denies chest pain, shortness of breath, lower extremity edema, fatigue, palpitations, melena, hematuria, hemoptysis, diaphoresis, weakness, presyncope, syncope, orthopnea, and PND.  All other systems are reviewed and otherwise negative. Studies Reviewed: Aaron Aas   EKG:  EKG is not ordered today.  CV Studies: Cardiac studies reviewed are outlined and summarized above. Otherwise please see EMR for full report. Cardiac Studies & Procedures   ______________________________________________________________________________________________     ECHOCARDIOGRAM  ECHOCARDIOGRAM COMPLETE 01/09/2024  Narrative  ECHOCARDIOGRAM REPORT    Patient Name:   Andrea Marks Date of Exam: 01/09/2024 Medical Rec #:  [id number]                  Height:       62.0 in Accession #:    [id number]                 Weight:       278.0  lb Date of Birth:  1951/04/19                  BSA:          2.199 m Patient Age:    72 years                   BP:           114/48 mmHg Patient Gender: F                          HR:           61 bpm. Exam Location:  Inpatient  Procedure: 2D Echo, Color Doppler and Cardiac Doppler (Both Spectral and Color Flow Doppler were utilized during procedure).  Indications:    I48.91* Unspecified atrial fibrillation  History:        Patient has no prior history of Echocardiogram examinations.  Sonographer:    Sherline Distel Senior RDCS Referring Phys: [id number] Jonel Nephew LATIF [facility]  IMPRESSIONS   1. Left ventricular ejection fraction, by estimation, is 55 to 60%. The left ventricle has normal function. The left ventricle has no regional wall motion abnormalities. There is mild concentric left ventricular hypertrophy. Left ventricular diastolic parameters were normal. 2. Right ventricular systolic function is normal. The right ventricular size is normal. Mildly increased right ventricular wall thickness. Tricuspid regurgitation signal is inadequate for assessing PA pressure. 3. Left atrial size was mildly dilated. 4. A small pericardial effusion is present. The pericardial effusion is posterior to the left ventricle. There is no evidence of cardiac tamponade. 5. The mitral valve is normal in structure. No evidence of mitral valve regurgitation. 6. The aortic valve is tricuspid. Aortic valve regurgitation is not visualized. 7. The inferior vena cava is normal in size with <50% respiratory variability, suggesting right atrial pressure of 8 mmHg.  FINDINGS Left Ventricle: Left ventricular ejection fraction, by estimation, is 55 to 60%. The left ventricle has normal function. The left ventricle has no regional wall motion abnormalities. The left ventricular internal cavity size was normal in size. There is mild concentric left ventricular hypertrophy. Left ventricular diastolic parameters were normal.  Right  Ventricle: The right ventricular size is normal. Mildly increased right ventricular wall thickness. Right ventricular systolic function is normal. Tricuspid regurgitation signal is inadequate for assessing PA pressure.  Left Atrium: Left atrial size was mildly dilated.  Right Atrium: Right atrial size was normal in size.  Pericardium: A small pericardial effusion is present. The pericardial effusion is posterior to the left ventricle. There is no evidence of cardiac tamponade.  Mitral Valve: The mitral valve is normal in structure. No evidence of mitral valve regurgitation.  Tricuspid Valve: The tricuspid valve is normal in structure. Tricuspid valve regurgitation is trivial.  Aortic Valve: The aortic valve is tricuspid. Aortic valve regurgitation is not visualized.  Pulmonic Valve: The pulmonic valve was normal in structure. Pulmonic valve regurgitation is not visualized.  Aorta: The aortic root and ascending aorta are structurally normal,  with no evidence of dilitation.  Venous: The inferior vena cava is normal in size with less than 50% respiratory variability, suggesting right atrial pressure of 8 mmHg.  IAS/Shunts: No atrial level shunt detected by color flow Doppler.   LEFT VENTRICLE PLAX 2D LVIDd:         4.80 cm   Diastology LVIDs:         3.30 cm   LV e' medial:    6.74 cm/s LV PW:         1.10 cm   LV E/e' medial:  13.9 LV IVS:        1.00 cm   LV e' lateral:   7.51 cm/s LVOT diam:     1.90 cm   LV E/e' lateral: 12.5 LV SV:         62 LV SV Index:   28 LVOT Area:     2.84 cm   RIGHT VENTRICLE RV S prime:     17.50 cm/s TAPSE (M-mode): 2.0 cm  LEFT ATRIUM             Index        RIGHT ATRIUM           Index LA diam:        4.40 cm 2.00 cm/m   RA Area:     14.20 cm LA Vol (A2C):   73.1 ml 33.24 ml/m  RA Volume:   30.20 ml  13.73 ml/m LA Vol (A4C):   55.1 ml 25.06 ml/m LA Biplane Vol: 66.9 ml 30.42 ml/m AORTIC VALVE LVOT Vmax:   125.00 cm/s LVOT Vmean:   74.000 cm/s LVOT VTI:    0.218 m  AORTA Ao Root diam: 3.10 cm Ao Asc diam:  3.50 cm  MITRAL VALVE MV Area (PHT): 3.19 cm    SHUNTS MV Decel Time: 238 msec    Systemic VTI:  0.22 m MV E velocity: 93.55 cm/s  Systemic Diam: 1.90 cm MV A velocity: 62.60 cm/s MV E/A ratio:  1.49  Jules Oar MD Electronically signed by Jules Oar MD Signature Date/Time: 01/09/2024/12:40:02 PM    Final    MONITORS  LONG TERM MONITOR (3-14 DAYS) 02/07/2024  Narrative 40% burden of atrial fibrillation/AFL  Patch Wear Time:  13 days and 15 hours (2025-03-13T18:48:23-0400 to 2025-03-27T09:58:35-0400)  Patient had a min HR of 54 bpm, max HR of 175 bpm, and avg HR of 78 bpm. Predominant underlying rhythm was Sinus Rhythm. First Degree AV Block was present. Atrial Fibrillation/Flutter occurred (40% burden), ranging from 55-175 bpm (avg of 91 bpm), the longest lasting 1 day 19 hours with an avg rate of 89 bpm. Isolated SVEs were rare (<1.0%), SVE Couplets were rare (<1.0%), and SVE Triplets were rare (<1.0%). Isolated VEs were occasional (1.7%, [id number]), VE Couplets were rare (<1.0%, 78), and no VE Triplets were present. Ventricular Bigeminy and Trigeminy were present.       ______________________________________________________________________________________________       Current Reported Medications:.    Current Meds  Medication Sig   acetaminophen  (TYLENOL ) 650 MG CR tablet Take 1,300 mg by mouth 2 (two) times daily as needed for pain. For pain   apixaban  (ELIQUIS ) 5 MG TABS tablet Take 1 tablet (5 mg total) by mouth 2 (two) times daily.   benazepril  (LOTENSIN ) 20 MG tablet Take 1 tablet (20 mg total) by mouth daily.   Continuous Glucose Receiver (FREESTYLE LIBRE 3 READER) DEVI As directed   Continuous Glucose Sensor (FREESTYLE LIBRE 3 SENSOR)  MISC Place 1 sensor on the skin every 14 days. Use to check glucose continuously   diltiazem  (CARDIZEM  CD) 180 MG 24 hr capsule Take 1 capsule  (180 mg total) by mouth daily.   DULoxetine  (CYMBALTA ) 30 MG capsule Take 1 capsule (30 mg total) by mouth daily.   estradiol  (ESTRACE ) 1 MG tablet Take 1 tablet (1 mg total) by mouth daily.   fenofibrate  160 MG tablet TAKE 1 TABLET DAILY   insulin  aspart (NOVOLOG  FLEXPEN) 100 UNIT/ML FlexPen Inject 12-15 Units into the skin 3 (three) times daily with meals. INJECT 12 TO 15 UNITS SUBCUTANEOUSLY 3 TIMES A   DAY WITH MEALS   Insulin  Glargine (BASAGLAR  KWIKPEN) 100 UNIT/ML Inject 30 Units into the skin daily.   metFORMIN  (GLUCOPHAGE ) 850 MG tablet Take 1 tablet in the AM, and 2 tablets in the PM.   omeprazole  (PRILOSEC) 20 MG capsule Take 1 capsule (20 mg total) by mouth daily.   OZEMPIC , 2 MG/DOSE, 8 MG/3ML SOPN INJECT 2MG  SUBCUTANEOUSLY  ONCE WEEKLY (EVERY 7 DAYS) AS DIRECTED   tobramycin (TOBREX) 0.3 % ophthalmic solution Place 1 drop into both eyes 4 (four) times daily. Use eye solution for 3 days after eye injections   [DISCONTINUED] atorvastatin  (LIPITOR) 20 MG tablet Take 1 tablet (20 mg total) by mouth at bedtime.   [DISCONTINUED] ezetimibe  (ZETIA ) 10 MG tablet Take 1 tablet (10 mg total) by mouth daily.   Current Facility-Administered Medications for the 02/20/24 encounter (Office Visit) with Inna Tisdell D, NP  Medication   ipratropium-albuterol  (DUONEB) 0.5-2.5 (3) MG/3ML nebulizer solution 3 mL    Physical Exam:    VS:  BP 136/62   Pulse 70   Ht 5\' 3"  (1.6 m)   Wt 281 lb (127.5 kg)   SpO2 95%   BMI 49.78 kg/m    Wt Readings from Last 3 Encounters:  02/20/24 281 lb (127.5 kg)  02/16/24 277 lb 12.8 oz (126 kg)  02/10/24 284 lb 12.8 oz (129.2 kg)    GEN: Well nourished, well developed in no acute distress NECK: No JVD; No carotid bruits CARDIAC: RRR, no murmurs, rubs, gallops RESPIRATORY:  Clear to auscultation without rales, wheezing or rhonchi  ABDOMEN: Soft, non-tender, non-distended EXTREMITIES:  No edema; No acute deformity     Asessement and Plan:.    Paroxysmal  atrial fibrillation: Patient admitted in 12/2023, found to have new atrial fibrillation.  Cardiac monitor indicated 40% A-fib burden, now followed by A-fib clinic.  She has been started on Eliquis  and diltiazem , possibly to undergo Tikosyn  loading.  Patient completely unaware of atrial fibrillation, denies any palpitations or feeling of increased heart rates.  Patient denies any bleeding problems on Eliquis . CHA2DS2-VASc Score = 4 [CHF History: 0, HTN History: 1, Diabetes History: 1, Stroke History: 0, Vascular Disease History: 0, Age Score: 1, Gender Score: 1].  Therefore, the patient's annual risk of stroke is 4.8 %.      Diabetes: Last hemoglobin A1c 7.8%.  Monitor managed per PCP and endocrinology.  Hypertension: Blood pressure today 142/62, on recheck was 136/62.  Patient reports she has not yet taken her morning medications, will take when she arrives home.  Continue benazepril  20 mg daily and diltiazem  180 mg daily.  OSA: Following with neurology, sleep study currently pending.  Patient notes she is unsure if she will proceed with sleep study as she has problems with claustrophobia.  Hyperlipidemia: Last lipid profile on 10/18/2023 indicates total cholesterol 130, HDL 58, triglycerides 120 and LDL 48.  Refills of ezetimibe  and atorvastatin  provided.  This has been monitored and managed per PCP.    Disposition: F/u with Dr. Emmette Harms to establish care in 3-4 months.   Signed, Enes Rokosz D Uva Runkel, NP

## 2024-02-16 NOTE — Patient Instructions (Signed)

## 2024-02-16 NOTE — Progress Notes (Signed)
 Complex Care Management Note  Care Guide Note 02/16/2024 Name: Judi Jaffe MRN: [id number] DOB: 11/11/1950  Maybree Riling Kosier is a 73 y.o. year old female who sees Crecencio Dodge, Candida Chalk, DO for primary care. I reached out to Albin Huh Gardella by phone today to offer complex care management services.  Ms. Niday was given information about Complex Care Management services today including:   The Complex Care Management services include support from the care team which includes your Nurse Care Manager, Clinical Social Worker, or Pharmacist.  The Complex Care Management team is here to help remove barriers to the health concerns and goals most important to you. Complex Care Management services are voluntary, and the patient may decline or stop services at any time by request to their care team member.   Complex Care Management Consent Status: Patient agreed to services and verbal consent obtained.   Follow up plan:  Telephone appointment with complex care management team member scheduled for:  02/23/24 at 2:00 p.m.   Encounter Outcome:  Patient Scheduled  [facility]  [facility], [facility] Care Management Assistant Direct Dial: [phone]  Fax: [phone]

## 2024-02-17 ENCOUNTER — Other Ambulatory Visit: Payer: Self-pay

## 2024-02-17 ENCOUNTER — Ambulatory Visit (HOSPITAL_BASED_OUTPATIENT_CLINIC_OR_DEPARTMENT_OTHER)
Admission: RE | Admit: 2024-02-17 | Discharge: 2024-02-17 | Disposition: A | Discharge status: Home / Self Care | Source: Ambulatory Visit | Attending: Family Medicine | Admitting: Family Medicine

## 2024-02-17 ENCOUNTER — Other Ambulatory Visit (HOSPITAL_COMMUNITY): Payer: Self-pay

## 2024-02-17 DIAGNOSIS — R1900 Intra-abdominal and pelvic swelling, mass and lump, unspecified site: Secondary | ICD-10-CM | POA: Diagnosis present

## 2024-02-17 MED ORDER — IOHEXOL 300 MG/ML  SOLN
100.0000 mL | Freq: Once | INTRAMUSCULAR | Status: AC | PRN
  Administered 2024-02-17: 100 mL via INTRAVENOUS

## 2024-02-20 ENCOUNTER — Ambulatory Visit: Attending: Cardiology | Admitting: Cardiology

## 2024-02-20 ENCOUNTER — Encounter: Payer: Self-pay | Admitting: Internal Medicine

## 2024-02-20 ENCOUNTER — Encounter: Payer: Self-pay | Admitting: Cardiology

## 2024-02-20 VITALS — BP 136/62 | HR 70 | Ht 63.0 in | Wt 281.0 lb

## 2024-02-20 DIAGNOSIS — I480 Paroxysmal atrial fibrillation: Secondary | ICD-10-CM

## 2024-02-20 DIAGNOSIS — Z794 Long term (current) use of insulin: Secondary | ICD-10-CM

## 2024-02-20 DIAGNOSIS — E1165 Type 2 diabetes mellitus with hyperglycemia: Secondary | ICD-10-CM

## 2024-02-20 DIAGNOSIS — D6869 Other thrombophilia: Secondary | ICD-10-CM | POA: Diagnosis not present

## 2024-02-20 DIAGNOSIS — I10 Essential (primary) hypertension: Secondary | ICD-10-CM | POA: Diagnosis not present

## 2024-02-20 DIAGNOSIS — E782 Mixed hyperlipidemia: Secondary | ICD-10-CM

## 2024-02-20 DIAGNOSIS — I4819 Other persistent atrial fibrillation: Secondary | ICD-10-CM

## 2024-02-20 MED ORDER — EZETIMIBE 10 MG PO TABS: 10.0000 mg | ORAL_TABLET | Freq: Every day | ORAL | 2 refills | Status: DC

## 2024-02-20 MED ORDER — ATORVASTATIN CALCIUM 20 MG PO TABS: 20.0000 mg | ORAL_TABLET | Freq: Every day | ORAL | 2 refills | Status: DC

## 2024-02-20 NOTE — Patient Instructions (Signed)
 Medication Instructions:  Your physician recommends that you continue on your current medications as directed. Please refer to the Current Medication list given to you today.  *If you need a refill on your cardiac medications before your next appointment, please call your pharmacy*  Lab Work: No labs  Testing/Procedures: No testing  Follow-Up: At [facility], you and your health needs are our priority.  As part of our continuing mission to provide you with exceptional heart care, our providers are all part of one team.  This team includes your primary Cardiologist (physician) and Advanced Practice Providers or APPs (Physician Assistants and Nurse Practitioners) who all work together to provide you with the care you need, when you need it.  Your next appointment:   4 month(s)  Provider:   Kardie Tobb MD  We recommend signing up for the patient portal called "MyChart".  Sign up information is provided on this After Visit Summary.  MyChart is used to connect with patients for Virtual Visits (Telemedicine).  Patients are able to view lab/test results, encounter notes, upcoming appointments, etc.  Non-urgent messages can be sent to your provider as well.   To learn more about what you can do with MyChart, go to [email or web address].   Other Instructions:   1st Floor: - Lobby - Registration  - Pharmacy  - Lab - Cafe  2nd Floor: - PV Lab - Diagnostic Testing (echo, CT, nuclear med)  3rd Floor: - Vacant  4th Floor: - TCTS (cardiothoracic surgery) - AFib Clinic - Structural Heart Clinic - Vascular Surgery  - Vascular Ultrasound  5th Floor: - HeartCare Cardiology (general and EP) - Clinical Pharmacy for coumadin, hypertension, lipid, weight-loss medications, and med management appointments    Valet parking services will be available as well.

## 2024-02-21 ENCOUNTER — Ambulatory Visit: Payer: Medicare Other | Admitting: Internal Medicine

## 2024-02-21 NOTE — Telephone Encounter (Signed)
 I have talked with the patient via phone and I have submitted an order to [facility].

## 2024-02-23 ENCOUNTER — Other Ambulatory Visit: Payer: Self-pay

## 2024-02-23 DIAGNOSIS — Z794 Long term (current) use of insulin: Secondary | ICD-10-CM

## 2024-02-23 NOTE — Patient Instructions (Signed)
 Visit Information  Thank you for taking time to visit with me today. Please don't hesitate to contact me if I can be of assistance to you before our next scheduled appointment.  Our next appointment is by telephone on 03/05/24 at 3:30 Please call the care guide team at [phone] if you need to cancel or reschedule your appointment.   Following is a copy of your care plan:   Goals Addressed             This Visit's Progress    VBCI RN Care Plan       Problems:  Chronic Disease Management support and education needs related to Atrial Fibrillation and DMII  Goal: Over the next 7 days the Patient will attend all scheduled medical appointments: 4/25//25 Podiatry, 03/08/24 Afib clinic, 03/12/24 Urology, 05/22/24 Endocrinology, 06/19/24 CVD as evidenced by chart review and patient reporting        continue to work with RN Care Manager and/or Social Worker to address care management and care coordination needs related to Atrial Fibrillation, DMII, and HTN as evidenced by adherence to care management team scheduled appointments     demonstrate Ongoing adherence to prescribed treatment plan for Atrial Fibrillation, DMII, and HTN as evidenced by monitoring blood sugar and blood pressure and reporting results outside of provider indicated parameters take all medications exactly as prescribed and will call provider for medication related questions as evidenced by chart review and patient reporting    work with pharmacist to address Medication procurement related to DMII as evidenced by review of electronic medical record and patient or pharmacist report     Interventions:   Diabetes Interventions: Assessed patient's understanding of A1c goal: <6.5% Provided education to patient about basic DM disease process Reviewed medications with patient and discussed importance of medication adherence Counseled on importance of regular laboratory monitoring as prescribed Discussed plans with patient for  ongoing care management follow up and provided patient with direct contact information for care management team Advised patient, providing education and rationale, to check cbg daily and record, calling PCP for findings outside established parameters Referral made to pharmacy team for assistance with obtaining new CBG monitor/supplies Lab Results  Component Value Date   HGBA1C 7.8 (H) 01/09/2024    Patient Self-Care Activities:  Attend all scheduled provider appointments Call pharmacy for medication refills 3-7 days in advance of running out of medications Call provider office for new concerns or questions  Perform all self care activities independently  Take medications as prescribed   Work with the pharmacist to address medication management needs and will continue to work with the clinical team to address health care and disease management related needs check blood sugar at prescribed times: 4 times daily check feet daily for cuts, sores or redness  Plan:  Telephone follow up appointment with care management team member scheduled for:  03/05/24 at 3:30             Please call the Suicide and Crisis Lifeline: 988 call the [place]  National Suicide Prevention Lifeline: 934-068-0010 or TTY: 408-158-3556 TTY 925 229 8249) to talk to a trained counselor call 1-800-273-TALK (toll free, 24 hour hotline) if you are experiencing a Mental Health or Behavioral Health Crisis or need someone to talk to.  Patient verbalizes understanding of instructions and care plan provided today and agrees to view in MyChart. Active MyChart status and patient understanding of how to access instructions and care plan via MyChart confirmed with patient.      Clarnce Crow  BSN RN CCM [facility]  [facility], [facility] RN Care Manager Direct Dial: [phone] Fax: [phone]

## 2024-02-23 NOTE — Patient Outreach (Signed)
 Complex Care Management   Visit Note  02/23/2024  Name:  Andrea Marks MRN: [id number] DOB: 1951-03-06  Situation: Referral received for Complex Care Management related to Diabetes with Complications, Atrial Fibrillation, and HTN  I obtained verbal consent from Patient.  Visit completed with patient  on the phone  Background:   Past Medical History:  Diagnosis Date   Allergy    Anxiety    Arthritis    Cataract    Depression    Diabetes mellitus    GERD (gastroesophageal reflux disease)    Hyperlipidemia    Hypertension    Obesity     Assessment: Patient Reported Symptoms:  Cognitive Cognitive Status: Alert and oriented to person, place, and time   Health Maintenance Behaviors: Annual physical exam, Healthy diet, Sleep adequate, Exercise Healing Pattern: Unsure Health Facilitated by: Healthy diet, Rest  Neurological Neurological Review of Symptoms: No symptoms reported, Vision changes (existing retinopathy tx with injections) Neurological Management Strategies: Medication therapy, Routine screening Neurological Self-Management Outcome: 3 (uncertain)  HEENT HEENT Symptoms Reported: Runny nose (runny nose due to seasonal allergies) HEENT Self-Management Outcome: 4 (good)    Cardiovascular Cardiovascular Symptoms Reported:  (Afib) Does patient have uncontrolled Hypertension?: No Cardiovascular Conditions: Dysrhythmia Cardiovascular Management Strategies: Medication therapy, Routine screening, Exercise, Adequate rest (patient in care of HH PT OT) Weight:  (patient has no existing weight scale, will call insurance to obtain) Cardiovascular Self-Management Outcome: 3 (uncertain)  Respiratory Respiratory Symptoms Reported: No symptoms reported Other Respiratory Symptoms: patient pending sleep study waiting for insurance confirmation Respiratory Conditions: Sleep disordered breathing (pending sleep study) Respiratory Self-Management Outcome: 3 (uncertain)  Endocrine  Patient reports the following symptoms related to hypoglycemia or hyperglycemia : No symptoms reported Is patient diabetic?: Yes Is patient checking blood sugars at home?: No (patient reports CBG fell into the toilet, few days ago.  RNCM sent referral to [facility] pharm for assistance to get replacement) Endocrine Management Strategies: Adequate rest, Weight management, Medical device, Medication therapy, Routine screening, Exercise, Diet modification Endocrine Self-Management Outcome: 4 (good)  Gastrointestinal Gastrointestinal Symptoms Reported: Incontinence Gastrointestinal Conditions: Fecal incontinence Gastrointestinal Management Strategies: Incontinence garment/pad Gastrointestinal Self-Management Outcome: 4 (good) Nutrition Risk Screen (CP): No indicators present  Genitourinary Genitourinary Symptoms Reported: Incontinence Genitourinary Management Strategies: Incontinence garment/pad Genitourinary Self-Management Outcome: 3 (uncertain)  Integumentary Integumentary Symptoms Reported: Other (hematoma size of grapefruit, CT scan performed, patient to follow up with PCP for options) Skin Conditions: Other Skin Management Strategies: Coping strategies Skin Comment: patient reports hematoma is significantly interferrring with sleep and normal body movements causing pain  Musculoskeletal Musculoskelatal Symptoms Reviewed: Difficulty walking, Unsteady gait Additional Musculoskeletal Details: uses rollator, regular knee injections Musculoskeletal Conditions: Joint pain Musculoskeletal Management Strategies: Weight management, Medication therapy, Exercise, Activity Falls in the past year?: Yes Number of falls in past year: 1 or less Was there an injury with Fall?: Yes Fall Risk Category Calculator: 2 Patient Fall Risk Level: Moderate Fall Risk Patient at Risk for Falls Due to: History of fall(s), Impaired balance/gait, Impaired mobility, Orthopedic patient (patient currently undergoing HH PT  OT patient reports benefit)  Psychosocial Psychosocial Symptoms Reported: Not assessed (patient declined assessmentn and referral) Behavioral Health Conditions: Depression, Anxiety Behavioral Management Strategies: Coping strategies (depression and anxiety due to family loss, and recent hospitalization, isolation.  patient declined LCSW referral) Major Change/Loss/Stressor/Fears (CP): Death of a loved one, Medical condition, self Quality of Family Relationships: helpful, supportive Do you feel physically threatened by others?: No      02/14/2024  12:56 PM  Depression screen PHQ 2/9  Decreased Interest 1  Down, Depressed, Hopeless 0  PHQ - 2 Score 1    There were no vitals filed for this visit.  Medications Reviewed Today     Reviewed by Clarnce Crow, RN (Registered Nurse) on 02/23/24 at 1440  Med List Status: <None>   Medication Order Taking? Sig Documenting Provider Last Dose Status Informant  acetaminophen  (TYLENOL ) 650 MG CR tablet [id number] Yes Take 1,300 mg by mouth 2 (two) times daily as needed for pain. For pain [provider] Taking Active Self, Pharmacy Records  apixaban  (ELIQUIS ) 5 MG TABS tablet [id number] Yes Take 1 tablet (5 mg total) by mouth 2 (two) times daily. Fenton, Clint R, PA Taking Active   atorvastatin  (LIPITOR) 20 MG tablet [id number] Yes Take 1 tablet (20 mg total) by mouth at bedtime. West, Katlyn D, NP Taking Active   benazepril  (LOTENSIN ) 20 MG tablet [id number] Yes Take 1 tablet (20 mg total) by mouth daily. Modena Andes, MD Taking Active   Continuous Glucose Receiver (FREESTYLE LIBRE 3 READER) DEVI [id number] Yes As directed Estill Hemming, DO Taking Active   Continuous Glucose Sensor (FREESTYLE LIBRE 3 SENSOR) [place] [id number] Yes Place 1 sensor on the skin every 14 days. Use to check glucose continuously Crecencio Dodge, Candida Chalk, DO Taking Active   diltiazem  (CARDIZEM  CD) 180 MG 24 hr capsule [id number] Yes Take 1 capsule (180 mg total) by mouth  daily. Fenton, Clint R, PA Taking Active   DULoxetine  (CYMBALTA ) 30 MG capsule [id number] Yes Take 1 capsule (30 mg total) by mouth daily. Roel Clarity R, DO Taking Active   DULoxetine  (CYMBALTA ) 60 MG capsule [id number] No 1 po every day --- start after you finish 30 mg  Patient not taking: Reported on 02/23/2024   Estill Hemming, DO Not Taking Active   estradiol  (ESTRACE ) 1 MG tablet [id number] Yes Take 1 tablet (1 mg total) by mouth daily. Estill Hemming, DO Taking Active Self, Pharmacy Records  ezetimibe  (ZETIA ) 10 MG tablet [id number] Yes Take 1 tablet (10 mg total) by mouth daily. West, Katlyn D, NP Taking Active   fenofibrate  160 MG tablet [id number] Yes TAKE 1 TABLET DAILY Crecencio Dodge, Yvonne R, DO Taking Active Self, Pharmacy Records  FLUoxetine  (PROZAC ) 20 MG tablet [id number] No Take 3 tablets (60 mg total) by mouth daily.  Patient not taking: Reported on 02/20/2024   Estill Hemming, DO Not Taking Active Self, Pharmacy Records           Med Note Burley Carpenter, Jemari Hallum   Thu Feb 23, 2024  2:37 PM) Patient reports Afib clinic instructed her tdiscontinue and started Cymbalta   insulin  aspart (NOVOLOG  FLEXPEN) 100 UNIT/ML FlexPen [id number] Yes Inject 12-15 Units into the skin 3 (three) times daily with meals. INJECT 12 TO 15 UNITS SUBCUTANEOUSLY 3 TIMES A   DAY WITH MEALS Emilie Harden, MD Taking Active Self, Pharmacy Records  Insulin  Glargine (BASAGLAR  [facility]) 100 UNIT/ML [id number] Yes Inject 30 Units into the skin daily. [provider] Taking Active   ipratropium-albuterol  (DUONEB) 0.5-2.5 (3) MG/3ML nebulizer solution 3 mL [id number]   Copland, Skipper Dumas, MD  Active   metFORMIN  (GLUCOPHAGE ) 850 MG tablet [id number] Yes Take 1 tablet in the AM, and 2 tablets in the PM. Modena Andes, MD Taking Active   omeprazole  (PRILOSEC) 20 MG capsule [id number] Yes Take 1 capsule (20 mg total) by mouth daily. Lowne Chase, Yvonne R, DO Taking Active  Med Note (TOUSEY,  LAINE M   Tue Feb 14, 2024  1:13 PM) 02/14/24: Reports during [facility] call, now taking OTC medication- reports insurance company would not cover the Rx   OZEMPIC , 2 MG/DOSE, 8 MG/3ML SOPN [id number] Yes INJECT 2MG  SUBCUTANEOUSLY  ONCE WEEKLY (EVERY 7 DAYS) AS DIRECTED Emilie Harden, MD Taking Active Self, Pharmacy Records           Med Note Merlyn Starring, NICOLE   Mon Jan 09, 2024  1:54 AM) Inject on Sunday  tobramycin (TOBREX) 0.3 % ophthalmic solution [id number] Yes Place 1 drop into both eyes 4 (four) times daily. Use eye solution for 3 days after eye injections [provider] Taking Active Self, Pharmacy Records           Med Note (TOUSEY, LAINE M   Fri Jan 13, 2024 10:37 AM) 01/13/24: Reports during TOC call- she uses around her eye injections every 9 weeks            Recommendation:   PCP Follow-up Referral to: [facility] Pharmacist for assistance in obtaining replacement CBG supplies due to loss of equipment Patient requested call back from PCP to discuss recent CT scan results.  Follow Up Plan:   Telephone follow up appointment date/time:  03/05/24 at 3:30 Referral to Pharmacist Message sent to PCP to request call to patient re: CT scan results   Clarnce Crow BSN RN CCM [place]  [facility], [facility] RN Care Manager Direct Dial: [phone] Fax: [phone]

## 2024-02-24 ENCOUNTER — Encounter: Payer: Self-pay | Admitting: Podiatry

## 2024-02-24 ENCOUNTER — Ambulatory Visit: Payer: Medicare Other | Admitting: Podiatry

## 2024-02-24 DIAGNOSIS — M79676 Pain in unspecified toe(s): Secondary | ICD-10-CM

## 2024-02-24 DIAGNOSIS — B351 Tinea unguium: Secondary | ICD-10-CM

## 2024-02-24 DIAGNOSIS — Z794 Long term (current) use of insulin: Secondary | ICD-10-CM

## 2024-02-24 DIAGNOSIS — L84 Corns and callosities: Secondary | ICD-10-CM

## 2024-02-24 DIAGNOSIS — E1151 Type 2 diabetes mellitus with diabetic peripheral angiopathy without gangrene: Secondary | ICD-10-CM

## 2024-02-24 NOTE — Progress Notes (Signed)
This patient returns to my office for at risk foot care.  This patient requires this care by a professional since this patient will be at risk due to having diabetes and history of thrombophlebitis.  This patient says her callus on her big toes is painful walking and wearing her shoes.  She is unable to self treat.  This patient presents for at risk foot care today.  General Appearance  Alert, conversant and in no acute stress.  Vascular  Dorsalis pedis and posterior tibial  pulses are not  palpable  Bilaterally  Due to swelling..  Capillary return is within normal limits  bilaterally. Temperature is within normal limits  bilaterally.  Neurologic  Senn-Weinstein monofilament wire test diminished  bilaterally. Muscle power within normal limits bilaterally.  Nails Thick disfigured discolored nails with subungual debris  from hallux to fifth toes bilaterally. No evidence of bacterial infection or drainage bilaterally.  Orthopedic  No limitations of motion  feet .  No crepitus or effusions noted.  No bony pathology or digital deformities noted.  Skin  normotropic skin with no porokeratosis noted bilaterally.  No signs of infections or ulcers noted.   Pinch callus  B/L.   No infection or drainage noted.  Onychomycosis  Pain in right toes  Pain in left toes  Pinch callus  B/l.  Consent was obtained for treatment procedures.   Mechanical debridement of nails 1-5  bilaterally performed with a nail nipper.  Filed with dremel without incident.  Debride callus with dremel tool.   Return office visit   9  weeks                  Told patient to return for periodic foot care and evaluation due to potential at risk complications.   Helane Gunther DPM

## 2024-02-26 ENCOUNTER — Encounter: Payer: Self-pay | Admitting: Family Medicine

## 2024-02-26 DIAGNOSIS — S301XXA Contusion of abdominal wall, initial encounter: Secondary | ICD-10-CM

## 2024-02-27 ENCOUNTER — Telehealth: Payer: Self-pay

## 2024-02-27 ENCOUNTER — Other Ambulatory Visit (HOSPITAL_COMMUNITY): Payer: Self-pay

## 2024-02-27 ENCOUNTER — Encounter: Payer: Self-pay | Admitting: Internal Medicine

## 2024-02-27 ENCOUNTER — Other Ambulatory Visit: Payer: Self-pay

## 2024-02-27 ENCOUNTER — Telehealth: Payer: Self-pay | Admitting: Neurology

## 2024-02-27 NOTE — Progress Notes (Signed)
 Complex Care Management Note Care Guide Note  02/27/2024 Name: Andrea Marks MRN: [id number] DOB: Apr 13, 1951   Complex Care Management Outreach Attempts: An unsuccessful outreach was attempted for an appointment today.  Follow Up Plan:  Additional outreach attempts will be made to offer the patient complex care management information and services.   Encounter Outcome:  No Answer  [facility]  [facility], [facility] Care Management Assistant Direct Dial: [phone]  Fax: [phone]

## 2024-02-27 NOTE — Telephone Encounter (Signed)
 I spoke with the patient.  HST [employer] medicare no auth req pt requested the HST   She is scheduled at [facility] for 03/13/24 at 10:30 AM.  Mailed packet and sent mychart.

## 2024-02-27 NOTE — Progress Notes (Signed)
 Complex Care Management Note  Care Guide Note 02/27/2024 Name: Andrea Marks MRN: [id number] DOB: [birth year]  [name] is a 73 y.o. year old female who sees Crecencio Dodge, Candida Chalk, DO for primary care. I reached out to Albin Huh Strassman by phone today to offer complex care management services.  [name] was given information about Complex Care Management services today including:   The Complex Care Management services include support from the care team which includes your Nurse Care Manager, Clinical Social Worker, or Pharmacist.  The Complex Care Management team is here to help remove barriers to the health concerns and goals most important to you. Complex Care Management services are voluntary, and the patient may decline or stop services at any time by request to their care team member.   Complex Care Management Consent Status: Patient did not agree to participate in complex care management services at this time.  Encounter Outcome:  Patient Refused  Cayetano Coco [facility], [facility] Care Management Assistant Direct Dial: [phone]  Fax: [phone]

## 2024-02-28 NOTE — Telephone Encounter (Signed)
 Copied from CRM [id number]. Topic: Clinical - Medication Question >> Feb 27, 2024  1:32 PM Dyann Glaser G wrote: Reason for CRM: [name] WITH [facility] CHECKING THE PLAN OF CARE ORDER THAT WAS FAXED ON 01/24/2024

## 2024-02-29 ENCOUNTER — Other Ambulatory Visit (HOSPITAL_BASED_OUTPATIENT_CLINIC_OR_DEPARTMENT_OTHER): Payer: Self-pay | Admitting: Family

## 2024-02-29 ENCOUNTER — Other Ambulatory Visit: Payer: Self-pay | Admitting: Family Medicine

## 2024-02-29 ENCOUNTER — Ambulatory Visit: Admitting: Urology

## 2024-02-29 DIAGNOSIS — E782 Mixed hyperlipidemia: Secondary | ICD-10-CM

## 2024-02-29 DIAGNOSIS — F418 Other specified anxiety disorders: Secondary | ICD-10-CM

## 2024-03-05 ENCOUNTER — Other Ambulatory Visit: Payer: Self-pay

## 2024-03-05 NOTE — Patient Outreach (Signed)
 Complex Care Management   Visit Note  03/05/2024  Name:  Andrea Marks MRN: [id number] DOB: 02-20-1951  Situation: Referral received for Complex Care Management related to Diabetes with Complications and Atrial Fibrillation I obtained verbal consent from Patient.  Visit completed with patient  on the phone  Background:   Past Medical History:  Diagnosis Date   Allergy    Anxiety    Arthritis    Cataract    Depression    Diabetes mellitus    GERD (gastroesophageal reflux disease)    Hyperlipidemia    Hypertension    Obesity     Assessment: Patient Reported Symptoms:  Cognitive Cognitive Status: Alert and oriented to person, place, and time      Neurological Neurological Review of Symptoms: Vision changes (retinopathy   has opthamology appt 03/06/24) Neurological Management Strategies: Medication therapy Neurological Self-Management Outcome: 4 (good)  HEENT HEENT Symptoms Reported: No symptoms reported HEENT Management Strategies: Medication therapy, Routine screening    Cardiovascular Cardiovascular Symptoms Reported: Other: (abnormally high pulse rate, BP reading WNL) Other Cardiovascular Symptoms: AFIB /abnormal high pulse rate for the last week or so, normal BP readings.  Routed message to PCP to alert Does patient have uncontrolled Hypertension?: No Cardiovascular Management Strategies: Medication therapy, Routine screening Weight:  (patient reports has a new scale for home, but cant read numbers)  Respiratory Respiratory Symptoms Reported: No symptoms reported    Endocrine Patient reports the following symptoms related to hypoglycemia or hyperglycemia : No symptoms reported Is patient diabetic?: Yes Is patient checking blood sugars at home?: Yes Endocrine Conditions: Diabetes Endocrine Management Strategies: Adequate rest, Weight management, Medical device, Medication therapy, Coping strategies, Diet modification, Routine screening Endocrine  Self-Management Outcome: 4 (good)  Gastrointestinal Gastrointestinal Symptoms Reported: No symptoms reported   Nutrition Risk Screen (CP): No indicators present  Genitourinary Genitourinary Symptoms Reported:  (incontinence has improved somewhat) Genitourinary Conditions: Incontinence Other Genitourinary Conditions: patient has fu with urology 03/12/24. Genitourinary Self-Management Outcome: 3 (uncertain)  Integumentary Integumentary Symptoms Reported: No symptoms reported Skin Comment: will schedule appt with [facility] confirmed referral approved.  Musculoskeletal Musculoskelatal Symptoms Reviewed: Difficulty walking, Unsteady gait Additional Musculoskeletal Details: patient reports symptoms improving except for mobility and positioning due to lump Musculoskeletal Conditions: Back pain Musculoskeletal Management Strategies: Routine screening   Fall risk Follow up: Falls prevention discussed  Psychosocial Additional Psychological Details: patient reports unsure if change in medication is helpful or not due to stress of current medical condition            02/14/2024   12:56 PM  Depression screen PHQ 2/9  Decreased Interest 1  Down, Depressed, Hopeless 0  PHQ - 2 Score 1    Vitals:   03/03/24 1544 03/05/24 1544  BP: 115/74 119/78  Pulse: (!) 108 95    Medications Reviewed Today     Reviewed by Clarnce Crow, RN (Registered Nurse) on 03/05/24 at 1548  Med List Status: <None>   Medication Order Taking? Sig Documenting Provider Last Dose Status Informant  acetaminophen  (TYLENOL ) 650 MG CR tablet [id number] Yes Take 1,300 mg by mouth 2 (two) times daily as needed for pain. For pain [provider] Taking Active Self, Pharmacy Records  apixaban  (ELIQUIS ) 5 MG TABS tablet [id number] Yes Take 1 tablet (5 mg total) by mouth 2 (two) times daily. Fenton, Clint R, PA Taking Active   atorvastatin  (LIPITOR) 20 MG tablet [id number] Yes Take 1 tablet (20 mg total) by  mouth at bedtime. Chad,  Katlyn D, NP Taking Active   benazepril  (LOTENSIN ) 20 MG tablet [id number] Yes Take 1 tablet (20 mg total) by mouth daily. Modena Andes, MD Taking Active   Continuous Glucose Receiver (FREESTYLE LIBRE 3 READER) DEVI [id number] Yes As directed Estill Hemming, DO Taking Active   Continuous Glucose Sensor (FREESTYLE LIBRE 3 SENSOR) [place] [id number] Yes Place 1 sensor on the skin every 14 days. Use to check glucose continuously Crecencio Dodge, Candida Chalk, DO Taking Active   diltiazem  (CARDIZEM  CD) 180 MG 24 hr capsule [id number] Yes Take 1 capsule (180 mg total) by mouth daily. Fenton, [place] R, PA Taking Active   DULoxetine  (CYMBALTA ) 30 MG capsule [id number] Yes TAKE 1 CAPSULE BY MOUTH EVERY DAY Crecencio Dodge, Yvonne R, DO Taking Active   DULoxetine  (CYMBALTA ) 60 MG capsule [id number] No 1 po every day --- start after you finish 30 mg  Patient not taking: Reported on 03/05/2024   Estill Hemming, DO Not Taking Active   estradiol  (ESTRACE ) 1 MG tablet [id number] Yes Take 1 tablet (1 mg total) by mouth daily. Estill Hemming, DO Taking Active Self, Pharmacy Records  ezetimibe  (ZETIA ) 10 MG tablet [id number] Yes Take 1 tablet (10 mg total) by mouth daily. West, Katlyn D, NP Taking Active   fenofibrate  160 MG tablet [id number] Yes TAKE 1 TABLET DAILY Lowne Chase, Yvonne R, DO Taking Active Self, Pharmacy Records  insulin  aspart (NOVOLOG  FLEXPEN) 100 UNIT/ML FlexPen [id number] Yes Inject 12-15 Units into the skin 3 (three) times daily with meals. INJECT 12 TO 15 UNITS SUBCUTANEOUSLY 3 TIMES A   DAY WITH MEALS Emilie Harden, MD Taking Active Self, Pharmacy Records  Insulin  Glargine (BASAGLAR  [facility]) 100 UNIT/ML [id number] Yes Inject 30 Units into the skin daily. [provider] Taking Active   ipratropium-albuterol  (DUONEB) 0.5-2.5 (3) MG/3ML nebulizer solution 3 mL [id number]   Copland, Skipper Dumas, MD  Active   metFORMIN  (GLUCOPHAGE ) 850 MG tablet [id number] Yes Take 1 tablet  in the AM, and 2 tablets in the PM. Modena Andes, MD Taking Active   omeprazole  (PRILOSEC) 20 MG capsule [id number] No Take 1 capsule (20 mg total) by mouth daily.  Patient not taking: Reported on 03/05/2024   Estill Hemming, DO Not Taking Active            Med Note (TOUSEY, LAINE M   Tue Feb 14, 2024  1:13 PM) 02/14/24: Reports during [facility] call, now taking OTC medication- reports insurance company would not cover the Rx   OZEMPIC , 2 MG/DOSE, 8 MG/3ML Charisse Conception [id number] Yes INJECT 2MG  SUBCUTANEOUSLY  ONCE WEEKLY (EVERY 7 DAYS) AS DIRECTED Emilie Harden, MD Taking Active Self, Pharmacy Records           Med Note Merlyn Starring, NICOLE   Mon Jan 09, 2024  1:54 AM) Inject on Sunday  tobramycin (TOBREX) 0.3 % ophthalmic solution [id number] Yes Place 1 drop into both eyes 4 (four) times daily. Use eye solution for 3 days after eye injections [provider] Taking Active Self, Pharmacy Records           Med Note (TOUSEY, LAINE M   Fri Jan 13, 2024 10:37 AM) 01/13/24: Reports during TOC call- she uses around her eye injections every 9 weeks            Recommendation:   PCP Follow-up  RNCM message sent to PCP to inform of abnormal high pulse rate as per patient report.    Follow Up Plan:  Telephone follow up appointment date/time:  03/20/24 at 3:00   Clarnce Crow BSN RN CCM [place]  [facility], [facility] RN Care Manager Direct Dial: [phone] Fax: [phone]

## 2024-03-05 NOTE — Patient Instructions (Signed)
 Visit Information  Thank you for taking time to visit with me today. Please don't hesitate to contact me if I can be of assistance to you before our next scheduled appointment.  Our next appointment is by telephone on 03/20/24 at 3:00 Please call the care guide team at [phone] if you need to cancel or reschedule your appointment.   Following is a copy of your care plan:   Goals Addressed             This Visit's Progress    [facility] RN Care Plan   Improving    Problems:  Chronic Disease Management support and education needs related to Atrial Fibrillation and DMII  Goal: Over the next 30 days the Patient will attend all scheduled medical appointments: 03/08/24 Afib Clinic, 03/12/24 Urology, 03/13/24 sleep study as evidenced by chart review and patient reporting        continue to work with RN Care Manager and/or Social Worker to address care management and care coordination needs related to Atrial Fibrillation, DMII, and HTN as evidenced by adherence to care management team scheduled appointments     demonstrate Ongoing adherence to prescribed treatment plan for Atrial Fibrillation, DMII, and HTN as evidenced by monitoring blood sugar and blood pressure and reporting results outside of provider indicated parameters take all medications exactly as prescribed and will call provider for medication related questions as evidenced by chart review and patient reporting    work with pharmacist to address Medication procurement related to DMII as evidenced by review of electronic medical record and patient or pharmacist report     Interventions:   Diabetes Interventions: Assessed patient's understanding of A1c goal: <6.5% Provided education to patient about basic DM disease process Reviewed medications with patient and discussed importance of medication adherence Counseled on importance of regular laboratory monitoring as prescribed Discussed plans with patient for ongoing care management follow  up and provided patient with direct contact information for care management team Advised patient, providing education and rationale, to check cbg daily and record, calling PCP for findings outside established parameters Referral made to pharmacy team for assistance with obtaining new CBG monitor/supplies Lab Results  Component Value Date   HGBA1C 7.8 (H) 01/09/2024    Patient Self-Care Activities:  Attend all scheduled provider appointments Call pharmacy for medication refills 3-7 days in advance of running out of medications Call provider office for new concerns or questions  Perform all self care activities independently  Take medications as prescribed   check blood sugar at prescribed times: before meals and at bedtime, three times daily, and 4 times daily check feet daily for cuts, sores or redness enter blood sugar readings and medication or insulin  into daily log take the blood sugar log to all doctor visits check pulse (heart) rate before taking medicine check pulse (heart) rate once a day keep all lab appointments take medicine as prescribed  Plan:  Telephone follow up appointment with care management team member scheduled for:  03/20/24 at 3:00             Please call the Suicide and Crisis Lifeline: 988 call the [place]  National Suicide Prevention Lifeline: 780 137 4637 or TTY: 847-854-4322 TTY (343)311-4099) to talk to a trained counselor call 1-800-273-TALK (toll free, 24 hour hotline) if you are experiencing a Mental Health or Behavioral Health Crisis or need someone to talk to.  Patient verbalizes understanding of instructions and care plan provided today and agrees to view in MyChart. Active MyChart status and patient  understanding of how to access instructions and care plan via MyChart confirmed with patient.      Clarnce Crow BSN RN CCM [place]  [name], [facility] RN Care Manager Direct Dial: [phone] Fax: [phone]

## 2024-03-08 ENCOUNTER — Other Ambulatory Visit (HOSPITAL_COMMUNITY): Payer: Self-pay

## 2024-03-08 ENCOUNTER — Telehealth (HOSPITAL_COMMUNITY): Payer: Self-pay | Admitting: Pharmacy Technician

## 2024-03-08 ENCOUNTER — Encounter (HOSPITAL_COMMUNITY): Payer: Self-pay | Admitting: Physician Assistant

## 2024-03-08 ENCOUNTER — Ambulatory Visit (HOSPITAL_COMMUNITY)
Admission: RE | Admit: 2024-03-08 | Discharge: 2024-03-08 | Disposition: A | Discharge status: Home / Self Care | Source: Ambulatory Visit | Attending: Physician Assistant | Admitting: Physician Assistant

## 2024-03-08 ENCOUNTER — Other Ambulatory Visit: Payer: Self-pay | Admitting: Family Medicine

## 2024-03-08 VITALS — BP 132/92 | HR 148 | Ht 63.0 in | Wt 283.0 lb

## 2024-03-08 DIAGNOSIS — I484 Atypical atrial flutter: Secondary | ICD-10-CM | POA: Diagnosis not present

## 2024-03-08 DIAGNOSIS — D6869 Other thrombophilia: Secondary | ICD-10-CM

## 2024-03-08 DIAGNOSIS — I4819 Other persistent atrial fibrillation: Secondary | ICD-10-CM | POA: Diagnosis not present

## 2024-03-08 DIAGNOSIS — Z780 Asymptomatic menopausal state: Secondary | ICD-10-CM

## 2024-03-08 MED ORDER — DILTIAZEM HCL ER COATED BEADS 180 MG PO CP24: 180.0000 mg | ORAL_CAPSULE | Freq: Every day | ORAL | 4 refills | Status: DC

## 2024-03-08 MED ORDER — APIXABAN 5 MG PO TABS: 5.0000 mg | ORAL_TABLET | Freq: Two times a day (BID) | ORAL | 2 refills | Status: DC

## 2024-03-08 NOTE — Telephone Encounter (Signed)
 Patient Product/process development scientist completed.    The patient is insured through [employer] [facility]. Patient has [employer], may use a copay card, and/or apply for patient assistance if available.    Ran test claim for dofetilide (Tikosyn) 500 mcg and Requires Prior Authorization   This test claim was processed through [employer]- copay amounts may vary at other pharmacies due to [employer], or as the patient moves through the different stages of their insurance plan.     Morgan Arab, CPHT Pharmacy Technician III Certified Patient Advocate [facility] Pharmacy Patient Advocate Team Direct Number: [phone]  Fax: [phone]

## 2024-03-08 NOTE — Progress Notes (Signed)
 Primary Care Physician: Crecencio Dodge, Candida Chalk, DO Primary Cardiologist: Bridgette Campus, MD Electrophysiologist: None  Referring Physician: Dr Deloris Fetters Andrea Marks is a 73 y.o. female with a history of DM, HLD, HTN, atrial fibrillation who presents for follow up in the [facility].  The patient was initially diagnosed with atrial fibrillation 01/08/24 after presenting to the ED after a mechanical fall in her kitchen. EMS was called and she was found to be in afib with RVR. She was transported to the ED and started on IV diltiazem  which converted her to SR. Patient was not started on stroke prevention at that time due to a very large hematoma on her left flank. A two week cardiac monitor was ordered which showed 40% afib burden.   Patient returns for follow up for atrial fibrillation. Patient appears to be in atrial flutter today. She remains unaware of her arrhythmia. No bleeding issues on anticoagulation. She has a sleep study pending. She is also pending a surgical consult for the hematoma on her back.   Today, she  denies symptoms of palpitations, chest pain, shortness of breath, orthopnea, PND, lower extremity edema, dizziness, presyncope, syncope, bleeding, or neurologic sequela. The patient is tolerating medications without difficulties and is otherwise without complaint today.    Atrial Fibrillation Risk Factors:  she does have symptoms or diagnosis of sleep apnea. she does not have a history of rheumatic fever. she does not have a history of alcohol use. The patient does have a history of early familial atrial fibrillation or other arrhythmias. Mother had afib.   Atrial Fibrillation Management history:  Previous antiarrhythmic drugs: none Previous cardioversions: none  Previous ablations: none Anticoagulation history: Eliquis   ROS- All systems are reviewed and negative except as per the HPI above.  Past Medical History:  Diagnosis  Date   Allergy    Anxiety    Arthritis    Cataract    Depression    Diabetes mellitus    GERD (gastroesophageal reflux disease)    Hyperlipidemia    Hypertension    Obesity     Current Outpatient Medications  Medication Sig Dispense Refill   acetaminophen  (TYLENOL ) 650 MG CR tablet Take 1,300 mg by mouth 2 (two) times daily as needed for pain. For pain     apixaban  (ELIQUIS ) 5 MG TABS tablet Take 1 tablet (5 mg total) by mouth 2 (two) times daily. 60 tablet 3   atorvastatin  (LIPITOR) 20 MG tablet Take 1 tablet (20 mg total) by mouth at bedtime. 30 tablet 2   benazepril  (LOTENSIN ) 20 MG tablet Take 1 tablet (20 mg total) by mouth daily.     Continuous Glucose Receiver (FREESTYLE LIBRE 3 READER) DEVI As directed 1 each 0   Continuous Glucose Sensor (FREESTYLE LIBRE 3 SENSOR) MISC Place 1 sensor on the skin every 14 days. Use to check glucose continuously 6 each 3   diltiazem  (CARDIZEM  CD) 180 MG 24 hr capsule Take 1 capsule (180 mg total) by mouth daily. 30 capsule 1   DULoxetine  (CYMBALTA ) 30 MG capsule TAKE 1 CAPSULE BY MOUTH EVERY DAY 30 capsule 2   DULoxetine  (CYMBALTA ) 60 MG capsule 1 po every day --- start after you finish 30 mg 90 capsule 3   estradiol  (ESTRACE ) 1 MG tablet TAKE 1 TABLET DAILY 90 tablet 1   ezetimibe  (ZETIA ) 10 MG tablet Take 1 tablet (10 mg total) by mouth daily. 30 tablet 2   fenofibrate  160 MG  tablet TAKE 1 TABLET DAILY 90 tablet 1   insulin  aspart (NOVOLOG  FLEXPEN) 100 UNIT/ML FlexPen Inject 12-15 Units into the skin 3 (three) times daily with meals. INJECT 12 TO 15 UNITS SUBCUTANEOUSLY 3 TIMES A   DAY WITH MEALS 45 mL 3   Insulin  Glargine (BASAGLAR  KWIKPEN) 100 UNIT/ML Inject 30 Units into the skin daily.     metFORMIN  (GLUCOPHAGE ) 850 MG tablet Take 1 tablet in the AM, and 2 tablets in the PM.     omeprazole  (PRILOSEC) 20 MG capsule Take 1 capsule (20 mg total) by mouth daily. 30 capsule 3   OZEMPIC , 2 MG/DOSE, 8 MG/3ML SOPN INJECT 2MG  SUBCUTANEOUSLY  ONCE  WEEKLY (EVERY 7 DAYS) AS DIRECTED 9 mL 3   tobramycin (TOBREX) 0.3 % ophthalmic solution Place 1 drop into both eyes 4 (four) times daily. Use eye solution for 3 days after eye injections     Current Facility-Administered Medications  Medication Dose Route Frequency Provider Last Rate Last Admin   ipratropium-albuterol  (DUONEB) 0.5-2.5 (3) MG/3ML nebulizer solution 3 mL  3 mL Nebulization Q6H Copland, Skipper Dumas, MD   3 mL at 11/15/16 1330    Physical Exam: BP (!) 132/92   Pulse (!) 148   Ht 5\' 3"  (1.6 m)   Wt 128.4 kg   BMI 50.13 kg/m   GEN: Well nourished, well developed in no acute distress CARDIAC: Regular rate and rhythm, tachycardia, no murmurs, rubs, gallops RESPIRATORY:  Clear to auscultation without rales, wheezing or rhonchi  ABDOMEN: Soft, non-tender, non-distended EXTREMITIES:  No edema; No deformity    Wt Readings from Last 3 Encounters:  03/08/24 128.4 kg  02/20/24 127.5 kg  02/16/24 126 kg     EKG today demonstrates  Atypical atrial flutter with 2:1 block Vent. rate 148 BPM PR interval 184 ms QRS duration 80 ms QT/QTcB 250/392 ms   Echo 01/09/24 demonstrated   1. Left ventricular ejection fraction, by estimation, is 55 to 60%. The  left ventricle has normal function. The left ventricle has no regional  wall motion abnormalities. There is mild concentric left ventricular  hypertrophy. Left ventricular diastolic parameters were normal.   2. Right ventricular systolic function is normal. The right ventricular  size is normal. Mildly increased right ventricular wall thickness.  Tricuspid regurgitation signal is inadequate for assessing PA pressure.   3. Left atrial size was mildly dilated.   4. A small pericardial effusion is present. The pericardial effusion is  posterior to the left ventricle. There is no evidence of cardiac  tamponade.   5. The mitral valve is normal in structure. No evidence of mitral valve  regurgitation.   6. The aortic valve is  tricuspid. Aortic valve regurgitation is not  visualized.   7. The inferior vena cava is normal in size with <50% respiratory  variability, suggesting right atrial pressure of 8 mmHg.    CHA2DS2-VASc Score = 4  The patient's score is based upon: CHF History: 0 HTN History: 1 Diabetes History: 1 Stroke History: 0 Vascular Disease History: 0 Age Score: 1 Gender Score: 1       ASSESSMENT AND PLAN: Paroxysmal Atrial Fibrillation/atrial flutter The patient's CHA2DS2-VASc score is 4, indicating a 4.8% annual risk of stroke.   Patient in rapid atrial flutter today. We discussed rhythm control options again. Patient would like to pursue dofetilide admission Continue Eliquis  5 mg BID, states no missed doses in the last 3 weeks. No recent benadryl use PharmD to screen medications for QT prolonging  agents. She has already changed Prozac  to Cymbalta  QTc in SR 409 ms Recent labs reviewed  Continue diltiazem  180 mg daily   Secondary Hypercoagulable State (ICD10:  D68.69) The patient is at significant risk for stroke/thromboembolism based upon her CHA2DS2-VASc Score of 4.  Continue Apixaban  (Eliquis ). No bleeding issues.   Suspected OSA  Sleep study scheduled for 03/13/24  HTN Stable on current regimen  Obesity Body mass index is 50.13 kg/m.  Encouraged lifestyle modification   Follow up in the AF clinic for dofetilide loading.       Myrtha Ates PA-C [facility] [address] [place], [place] [address] [phone]

## 2024-03-08 NOTE — Patient Instructions (Signed)
 HOLD ozempic  until after admission

## 2024-03-09 ENCOUNTER — Telehealth: Payer: Self-pay

## 2024-03-09 ENCOUNTER — Telehealth: Payer: Self-pay | Admitting: Pharmacist

## 2024-03-09 ENCOUNTER — Telehealth (HOSPITAL_COMMUNITY): Payer: Self-pay | Admitting: *Deleted

## 2024-03-09 ENCOUNTER — Encounter: Payer: Self-pay | Admitting: Internal Medicine

## 2024-03-09 ENCOUNTER — Other Ambulatory Visit (HOSPITAL_COMMUNITY): Payer: Self-pay | Admitting: *Deleted

## 2024-03-09 MED ORDER — APIXABAN 5 MG PO TABS: 5.0000 mg | ORAL_TABLET | Freq: Two times a day (BID) | ORAL | 2 refills | Status: DC

## 2024-03-09 NOTE — Patient Outreach (Signed)
 RNCM Telephone Encounter Note  Received message from [facility] that patient needed to reschedule appointment for 03/20/24. She is being admitted to start Tikosyn  Will be inpatient fore 4 days for monitoring.  Changed her appt to 03/27/24.  Ensured patient has RNCM contact information for any needs.   Clarnce Crow BSN RN CCM   [facility], [facility] RN Care Manager Direct Dial: [phone] Fax: [phone]

## 2024-03-09 NOTE — Telephone Encounter (Signed)
 Preauthorization for dofetilide approved by [facility] [employer] through 03/08/25. PA # [id number] CP

## 2024-03-09 NOTE — Telephone Encounter (Signed)
 Medication list reviewed in anticipation of upcoming Tikosyn initiation. Patient is not taking any contraindicated medications. She is taking 1 QTc prolonging medication and 2 interacting medications.  Albuterol : Indeterminate QTc risk. Unsure how often patient uses her DuoNeb nebulizer. Monitoring of QTc is recommended.  Diltiazem : May increase the concentration of Tikosyn. Monitoring for signs of toxicity, such as QTc is recommended. No dose change needed.   Metformin : May increase the concentration of Tikosyn. Monitoring for signs of toxicity, such as QTc is recommended. No dose change needed.   Patient is anticoagulated on Eliquis  on the appropriate dose. Please ensure that patient has not missed any anticoagulation doses in the 3 weeks prior to Tikosyn initiation.   Patient will need to be counseled to avoid use of Benadryl while on Tikosyn and in the 2-3 days prior to Tikosyn initiation.

## 2024-03-09 NOTE — Telephone Encounter (Signed)
 Copied from CRM [id number]. Topic: General - Other >> Mar 09, 2024  9:50 AM Jethro Morrison wrote: Reason for CRM: [name] WITH [facility] [id number] IS CALLING ABOUT THE PLAN OF CARE ORDER THAT FAXED OVER SEVERAL TIMES. SHE IS WANTING TO NOW IF THIS HAS BEEN COMPLETED AND FAXED BACK. IF SOMEONE COULD GIVE THEM A CALL BACK PLEASE

## 2024-03-09 NOTE — Telephone Encounter (Signed)
 Returned call and advised POC is with the provider

## 2024-03-11 NOTE — Progress Notes (Incomplete)
 Chief Complaint: No chief complaint on file.   History of Present Illness:  Andrea Marks is a 73 y.o. female who is seen in consultation from Crecencio Dodge, Candida Chalk, DO for evaluation of ***.   Past Medical History:  Past Medical History:  Diagnosis Date   Allergy    Anxiety    Arthritis    Cataract    Depression    Diabetes mellitus    GERD (gastroesophageal reflux disease)    Hyperlipidemia    Hypertension    Obesity     Past Surgical History:  Past Surgical History:  Procedure Laterality Date   ABDOMINAL HYSTERECTOMY  11/01/1989   BSO   EYE SURGERY     IR ABLATE LIVER CRYOABLATION  12/31/2021   IR RADIOLOGIST EVAL & MGMT  12/11/2021   IR RADIOLOGIST EVAL & MGMT  01/07/2022   IR RADIOLOGIST EVAL & MGMT  02/15/2022    Allergies:  Allergies  Allergen Reactions   Hydrocodone-Acetaminophen  Other (See Comments)    unknown   Oxycodone Hcl Other (See Comments)    unknown   Penicillins Other (See Comments)    unknown   Prednisone      Patient has noted allergy to prednisone  but she says it was a questionable history of mild nausea with prednisone  and nothing more     Family History:  Family History  Problem Relation Age of Onset   Macular degeneration Mother    Heart disease Mother        syncope   Aortic stenosis Mother    Lung disease Father 29       mesothelioma   Cancer Maternal Aunt        stomach   Heart disease Paternal Uncle        cabg   Diabetes Paternal Uncle    Heart disease Paternal Uncle    Diabetes Paternal Uncle    Diabetes Paternal Uncle    Heart disease Paternal Uncle    Heart disease Paternal Uncle    Heart disease Paternal Uncle    Lung cancer Other        asbestos   Diabetes Paternal Grandmother    Cancer Other        lung    Social History:  Social History   Tobacco Use   Smoking status: Former    Current packs/day: 0.00    Types: Cigarettes    Quit date: 1995    Years since quitting: 30.3     Passive exposure: Past   Smokeless tobacco: Never   Tobacco comments:    Former smoker 02/09/24  Vaping Use   Vaping status: Never Used  Substance Use Topics   Alcohol use: No   Drug use: No    Review of symptoms:  Constitutional:  Negative for unexplained weight loss, night sweats, fever, chills ENT:  Negative for nose bleeds, sinus pain, painful swallowing CV:  Negative for chest pain, shortness of breath, exercise intolerance, palpitations, loss of consciousness Resp:  Negative for cough, wheezing, shortness of breath GI:  Negative for nausea, vomiting, diarrhea, bloody stools GU:  Positives noted in HPI; otherwise negative for gross hematuria, dysuria, urinary incontinence Neuro:  Negative for seizures, poor balance, limb weakness, slurred speech Psych:  Negative for lack of energy, depression, anxiety Endocrine:  Negative for polydipsia, polyuria, symptoms of hypoglycemia (dizziness, hunger, sweating) Hematologic:  Negative for anemia, purpura, petechia, prolonged or excessive bleeding, use of anticoagulants  Allergic:  Negative for difficulty breathing or  choking as a result of exposure to anything; no shellfish allergy; no allergic response (rash/itch) to materials, foods  Physical exam: There were no vitals taken for this visit. GENERAL APPEARANCE:  Well appearing, well developed, well nourished, NAD HEENT: Atraumatic, Normocephalic. NECK: Normal appearance LUNGS: Normal inspiratory and expiratory excursion HEART: Regular Rate ABDOMEN: *** EXTREMITIES: Moves all extremities well.  Without clubbing, cyanosis, or edema. NEUROLOGIC:  Alert and oriented x 3, normal gait, CN II-XII grossly intact.  MENTAL STATUS:  Appropriate. SKIN:  Warm, dry and intact.    Results: No results found. However, due to the size of the patient record, not all encounters were searched. Please check Results Review for a complete set of results.   I have reviewed referring/prior physicians  records  I have reviewed urinalysis  I have reviewed prior urine cultures  I reviewed prior imaging studies--CT A/P from 4.18.2025  Assessment: No diagnosis found.   Plan: ***

## 2024-03-12 ENCOUNTER — Ambulatory Visit: Admitting: Urology

## 2024-03-12 ENCOUNTER — Telehealth (HOSPITAL_COMMUNITY): Payer: Self-pay

## 2024-03-12 DIAGNOSIS — N281 Cyst of kidney, acquired: Secondary | ICD-10-CM

## 2024-03-12 NOTE — Telephone Encounter (Signed)
 Initiated Prior authorization to [facility] for Tikosyn Admission. Date: 03/19/2024 Pending Authorization # [id number] Clinical manager will reach out for our clinic to fax clinical notes.

## 2024-03-13 ENCOUNTER — Ambulatory Visit: Admitting: Neurology

## 2024-03-13 DIAGNOSIS — G4719 Other hypersomnia: Secondary | ICD-10-CM

## 2024-03-13 DIAGNOSIS — R600 Localized edema: Secondary | ICD-10-CM

## 2024-03-13 DIAGNOSIS — R519 Headache, unspecified: Secondary | ICD-10-CM

## 2024-03-13 DIAGNOSIS — R0683 Snoring: Secondary | ICD-10-CM

## 2024-03-13 DIAGNOSIS — Z9189 Other specified personal risk factors, not elsewhere classified: Secondary | ICD-10-CM

## 2024-03-13 DIAGNOSIS — G4733 Obstructive sleep apnea (adult) (pediatric): Secondary | ICD-10-CM | POA: Diagnosis not present

## 2024-03-13 DIAGNOSIS — R351 Nocturia: Secondary | ICD-10-CM

## 2024-03-13 DIAGNOSIS — I480 Paroxysmal atrial fibrillation: Secondary | ICD-10-CM

## 2024-03-15 ENCOUNTER — Telehealth: Payer: Self-pay

## 2024-03-15 ENCOUNTER — Encounter (HOSPITAL_COMMUNITY): Payer: Self-pay

## 2024-03-15 NOTE — Progress Notes (Signed)
 See procedure note.

## 2024-03-15 NOTE — Telephone Encounter (Signed)
 Copied from CRM [id number]. Topic: General - Other >> Mar 15, 2024 11:50 AM Albertha Alosa wrote: Reason for CRM: [name] from [facility] home health called in regarding the orders that was sent on 05/09, wanting to know if they could be signed and refaxed back

## 2024-03-15 NOTE — Telephone Encounter (Signed)
 No pre certification required for Tikosyn admission.  Date of service: 03/19/2024

## 2024-03-19 ENCOUNTER — Other Ambulatory Visit: Payer: Self-pay

## 2024-03-19 ENCOUNTER — Other Ambulatory Visit (HOSPITAL_COMMUNITY): Payer: Self-pay

## 2024-03-19 ENCOUNTER — Inpatient Hospital Stay (HOSPITAL_COMMUNITY)
Admission: AD | Admit: 2024-03-19 | Discharge: 2024-03-22 | DRG: 309 | Disposition: A | Discharge status: Home / Self Care | Source: Ambulatory Visit | Attending: Cardiology | Admitting: Cardiology

## 2024-03-19 ENCOUNTER — Encounter (HOSPITAL_COMMUNITY): Payer: Self-pay | Admitting: Physician Assistant

## 2024-03-19 ENCOUNTER — Ambulatory Visit (HOSPITAL_COMMUNITY)
Admission: RE | Admit: 2024-03-19 | Discharge: 2024-03-19 | Disposition: A | Discharge status: Home / Self Care | Source: Ambulatory Visit | Attending: Physician Assistant | Admitting: Physician Assistant

## 2024-03-19 ENCOUNTER — Telehealth (HOSPITAL_COMMUNITY): Payer: Self-pay | Admitting: Pharmacy Technician

## 2024-03-19 ENCOUNTER — Encounter (HOSPITAL_COMMUNITY): Payer: Self-pay | Admitting: Cardiology

## 2024-03-19 VITALS — BP 142/90 | HR 140 | Ht 63.0 in | Wt 291.4 lb

## 2024-03-19 DIAGNOSIS — Z7901 Long term (current) use of anticoagulants: Secondary | ICD-10-CM

## 2024-03-19 DIAGNOSIS — Z888 Allergy status to other drugs, medicaments and biological substances status: Secondary | ICD-10-CM

## 2024-03-19 DIAGNOSIS — Z66 Do not resuscitate: Secondary | ICD-10-CM | POA: Diagnosis present

## 2024-03-19 DIAGNOSIS — E669 Obesity, unspecified: Secondary | ICD-10-CM | POA: Diagnosis present

## 2024-03-19 DIAGNOSIS — I4819 Other persistent atrial fibrillation: Secondary | ICD-10-CM

## 2024-03-19 DIAGNOSIS — E119 Type 2 diabetes mellitus without complications: Secondary | ICD-10-CM | POA: Diagnosis present

## 2024-03-19 DIAGNOSIS — I10 Essential (primary) hypertension: Secondary | ICD-10-CM | POA: Diagnosis present

## 2024-03-19 DIAGNOSIS — G4733 Obstructive sleep apnea (adult) (pediatric): Secondary | ICD-10-CM | POA: Diagnosis present

## 2024-03-19 DIAGNOSIS — Z79899 Other long term (current) drug therapy: Secondary | ICD-10-CM | POA: Diagnosis not present

## 2024-03-19 DIAGNOSIS — E785 Hyperlipidemia, unspecified: Secondary | ICD-10-CM | POA: Diagnosis present

## 2024-03-19 DIAGNOSIS — E16A1 Hypoglycemia level 1: Secondary | ICD-10-CM | POA: Diagnosis present

## 2024-03-19 DIAGNOSIS — I4892 Unspecified atrial flutter: Secondary | ICD-10-CM | POA: Diagnosis present

## 2024-03-19 DIAGNOSIS — D6869 Other thrombophilia: Secondary | ICD-10-CM

## 2024-03-19 DIAGNOSIS — Z6841 Body Mass Index (BMI) 40.0 and over, adult: Secondary | ICD-10-CM

## 2024-03-19 DIAGNOSIS — R062 Wheezing: Secondary | ICD-10-CM

## 2024-03-19 DIAGNOSIS — Z885 Allergy status to narcotic agent status: Secondary | ICD-10-CM | POA: Diagnosis not present

## 2024-03-19 DIAGNOSIS — Z880 Allergy status to penicillin: Secondary | ICD-10-CM | POA: Diagnosis not present

## 2024-03-19 DIAGNOSIS — I484 Atypical atrial flutter: Principal | ICD-10-CM | POA: Diagnosis present

## 2024-03-19 LAB — BASIC METABOLIC PANEL WITH GFR
Anion gap: 9 (ref 5–15)
BUN/Creatinine Ratio: 29 — ABNORMAL HIGH (ref 12–28)
BUN: 26 mg/dL (ref 8–27)
BUN: 23 mg/dL (ref 8–23)
CO2: 27 mmol/L (ref 20–29)
CO2: 27 mmol/L (ref 22–32)
Calcium: 9.3 mg/dL (ref 8.7–10.3)
Calcium: 9.3 mg/dL (ref 8.9–10.3)
Chloride: 105 mmol/L (ref 96–106)
Chloride: 104 mmol/L (ref 98–111)
Creatinine, Ser: 0.89 mg/dL (ref 0.57–1.00)
Creatinine, Ser: 0.92 mg/dL (ref 0.44–1.00)
GFR, Estimated: >60 mL/min (ref >60)
Glucose, Bld: 61 mg/dL — ABNORMAL LOW (ref 70–99)
Glucose: 114 mg/dL — ABNORMAL HIGH (ref 70–99)
Potassium: 4.9 mmol/L (ref 3.5–5.2)
Potassium: 3.8 mmol/L (ref 3.5–5.1)
Sodium: 141 mmol/L (ref 134–144)
Sodium: 140 mmol/L (ref 135–145)
eGFR: 68 mL/min/{1.73_m2} (ref >59)

## 2024-03-19 LAB — COMPREHENSIVE METABOLIC PANEL WITH GFR
ALT: 9 U/L (ref 0–44)
AST: 12 U/L — ABNORMAL LOW (ref 15–41)
Albumin: 2.6 g/dL — ABNORMAL LOW (ref 3.5–5.0)
Alkaline Phosphatase: 24 U/L — ABNORMAL LOW (ref 38–126)
Anion gap: 12 (ref 5–15)
BUN: 23 mg/dL (ref 8–23)
CO2: 26 mmol/L (ref 22–32)
Calcium: 9.3 mg/dL (ref 8.9–10.3)
Chloride: 103 mmol/L (ref 98–111)
Creatinine, Ser: 0.98 mg/dL (ref 0.44–1.00)
GFR, Estimated: >60 mL/min (ref >60)
Glucose, Bld: 233 mg/dL — ABNORMAL HIGH (ref 70–99)
Potassium: 4.2 mmol/L (ref 3.5–5.1)
Sodium: 141 mmol/L (ref 135–145)
Total Bilirubin: 0.6 mg/dL (ref 0.0–1.2)
Total Protein: 5.3 g/dL — ABNORMAL LOW (ref 6.5–8.1)

## 2024-03-19 LAB — GLUCOSE, CAPILLARY
Glucose-Capillary: 60 mg/dL — ABNORMAL LOW (ref 70–99)
Glucose-Capillary: 121 mg/dL — ABNORMAL HIGH (ref 70–99)
Glucose-Capillary: 223 mg/dL — ABNORMAL HIGH (ref 70–99)

## 2024-03-19 LAB — MAGNESIUM
Magnesium: 1.3 mg/dL — ABNORMAL LOW (ref 1.6–2.3)
Magnesium: 1.3 mg/dL — ABNORMAL LOW (ref 1.7–2.4)
Magnesium: 2.2 mg/dL (ref 1.7–2.4)

## 2024-03-19 MED ORDER — POTASSIUM CHLORIDE CRYS ER 20 MEQ PO TBCR
40.0000 meq | EXTENDED_RELEASE_TABLET | Freq: Once | ORAL | Status: AC
  Administered 2024-03-19: 40 meq via ORAL
  Filled 2024-03-19: qty 2

## 2024-03-19 MED ORDER — INSULIN ASPART 100 UNIT/ML IJ SOLN
6.0000 [IU] | Freq: Three times a day (TID) | INTRAMUSCULAR | Status: DC
  Administered 2024-03-20 – 2024-03-22 (×7): 6 [IU] via SUBCUTANEOUS

## 2024-03-19 MED ORDER — BASAGLAR KWIKPEN 100 UNIT/ML ~~LOC~~ SOPN
30.0000 [IU] | PEN_INJECTOR | Freq: Every day | SUBCUTANEOUS | Status: DC
Start: 2024-03-20 — End: 2024-03-19

## 2024-03-19 MED ORDER — DULOXETINE HCL 60 MG PO CPEP
60.0000 mg | ORAL_CAPSULE | Freq: Every day | ORAL | Status: DC
  Administered 2024-03-20 – 2024-03-22 (×3): 60 mg via ORAL
  Filled 2024-03-19 (×3): qty 1

## 2024-03-19 MED ORDER — ATORVASTATIN CALCIUM 10 MG PO TABS
20.0000 mg | ORAL_TABLET | Freq: Every day | ORAL | Status: DC
  Administered 2024-03-19 – 2024-03-21 (×3): 20 mg via ORAL
  Filled 2024-03-19 (×4): qty 2

## 2024-03-19 MED ORDER — INSULIN ASPART 100 UNIT/ML IJ SOLN
0.0000 [IU] | Freq: Three times a day (TID) | INTRAMUSCULAR | Status: DC
  Administered 2024-03-20 – 2024-03-21 (×4): 4 [IU] via SUBCUTANEOUS
  Administered 2024-03-22: 3 [IU] via SUBCUTANEOUS

## 2024-03-19 MED ORDER — METFORMIN HCL 850 MG PO TABS
850.0000 mg | ORAL_TABLET | Freq: Every day | ORAL | Status: DC
  Administered 2024-03-20 – 2024-03-22 (×3): 850 mg via ORAL
  Filled 2024-03-19 (×3): qty 1

## 2024-03-19 MED ORDER — SODIUM CHLORIDE 0.9% FLUSH
3.0000 mL | Freq: Two times a day (BID) | INTRAVENOUS | Status: DC
  Administered 2024-03-19 – 2024-03-22 (×6): 3 mL via INTRAVENOUS

## 2024-03-19 MED ORDER — BENAZEPRIL HCL 20 MG PO TABS
20.0000 mg | ORAL_TABLET | Freq: Every day | ORAL | Status: DC
Start: 2024-03-20 — End: 2024-03-22
  Administered 2024-03-20 – 2024-03-22 (×3): 20 mg via ORAL
  Filled 2024-03-19 (×3): qty 1

## 2024-03-19 MED ORDER — DILTIAZEM HCL ER COATED BEADS 180 MG PO CP24
180.0000 mg | ORAL_CAPSULE | Freq: Every day | ORAL | Status: DC
  Administered 2024-03-20 – 2024-03-22 (×3): 180 mg via ORAL
  Filled 2024-03-19 (×3): qty 1

## 2024-03-19 MED ORDER — INSULIN ASPART 100 UNIT/ML FLEXPEN: 12.0000 [IU] | PEN_INJECTOR | Freq: Three times a day (TID) | SUBCUTANEOUS | Status: DC

## 2024-03-19 MED ORDER — ESTRADIOL 0.5 MG PO TABS
1.0000 mg | ORAL_TABLET | Freq: Every day | ORAL | Status: DC
  Administered 2024-03-20 – 2024-03-22 (×3): 1 mg via ORAL
  Filled 2024-03-19 (×3): qty 2

## 2024-03-19 MED ORDER — ACETAMINOPHEN 500 MG PO TABS: 1000.0000 mg | ORAL_TABLET | Freq: Two times a day (BID) | ORAL | Status: DC | PRN

## 2024-03-19 MED ORDER — PANTOPRAZOLE SODIUM 40 MG PO TBEC
40.0000 mg | DELAYED_RELEASE_TABLET | Freq: Every day | ORAL | Status: DC
  Administered 2024-03-20 – 2024-03-22 (×3): 40 mg via ORAL
  Filled 2024-03-19 (×3): qty 1

## 2024-03-19 MED ORDER — APIXABAN 5 MG PO TABS
5.0000 mg | ORAL_TABLET | Freq: Two times a day (BID) | ORAL | Status: DC
  Administered 2024-03-19 – 2024-03-22 (×6): 5 mg via ORAL
  Filled 2024-03-19 (×6): qty 1

## 2024-03-19 MED ORDER — SODIUM CHLORIDE 0.9 % IV SOLN: 250.0000 mL | INTRAVENOUS | Status: AC | PRN

## 2024-03-19 MED ORDER — EZETIMIBE 10 MG PO TABS
10.0000 mg | ORAL_TABLET | Freq: Every day | ORAL | Status: DC
Start: 2024-03-20 — End: 2024-03-22
  Administered 2024-03-20 – 2024-03-22 (×3): 10 mg via ORAL
  Filled 2024-03-19 (×3): qty 1

## 2024-03-19 MED ORDER — FENOFIBRATE 160 MG PO TABS
160.0000 mg | ORAL_TABLET | Freq: Every day | ORAL | Status: DC
  Administered 2024-03-20 – 2024-03-22 (×3): 160 mg via ORAL
  Filled 2024-03-19 (×3): qty 1

## 2024-03-19 MED ORDER — METFORMIN HCL 850 MG PO TABS
1700.0000 mg | ORAL_TABLET | Freq: Every day | ORAL | Status: DC
  Administered 2024-03-19 – 2024-03-21 (×3): 1700 mg via ORAL
  Filled 2024-03-19 (×4): qty 2

## 2024-03-19 MED ORDER — INSULIN ASPART 100 UNIT/ML IJ SOLN
0.0000 [IU] | Freq: Every day | INTRAMUSCULAR | Status: DC
  Administered 2024-03-19: 2 [IU] via SUBCUTANEOUS

## 2024-03-19 MED ORDER — INSULIN GLARGINE-YFGN 100 UNIT/ML ~~LOC~~ SOLN
25.0000 [IU] | Freq: Every day | SUBCUTANEOUS | Status: DC
Start: 2024-03-19 — End: 2024-03-22
  Administered 2024-03-19 – 2024-03-21 (×3): 25 [IU] via SUBCUTANEOUS
  Filled 2024-03-19 (×4): qty 0.25

## 2024-03-19 MED ORDER — DOFETILIDE 500 MCG PO CAPS
500.0000 ug | ORAL_CAPSULE | Freq: Two times a day (BID) | ORAL | Status: DC
  Administered 2024-03-19: 500 ug via ORAL
  Filled 2024-03-19: qty 1

## 2024-03-19 MED ORDER — SODIUM CHLORIDE 0.9% FLUSH: 3.0000 mL | INTRAVENOUS | Status: DC | PRN

## 2024-03-19 MED ORDER — MAGNESIUM SULFATE 4 GM/100ML IV SOLN
4.0000 g | Freq: Once | INTRAVENOUS | Status: AC
  Administered 2024-03-19: 4 g via INTRAVENOUS
  Filled 2024-03-19: qty 100

## 2024-03-19 NOTE — Telephone Encounter (Signed)
 Patient Product/process development scientist completed.    The patient is insured through [facility]. Patient has [employer], may use a copay card, and/or apply for patient assistance if available.    Ran test claim for dofetilide  (Tikosyn ) 500 mcg and the current 30 day co-pay is $101.29.   This test claim was processed through [facility]- copay amounts may vary at other pharmacies due to pharmacy/plan contracts, or as the patient moves through the different stages of their insurance plan.     Morgan Arab, CPHT Pharmacy Technician III Certified Patient Advocate [facility] Patient Advocate Team Direct Number: [phone]  Fax: [phone]

## 2024-03-19 NOTE — Progress Notes (Signed)
 Tikosyn  followup- Mag 2.2 after replacement. Ok for pm Tikosyn .  Lenard Quam, PharmD, BCPS, BCCCP Please refer to [facility] for [facility] numbers 11:06 PM, 03/19/2024

## 2024-03-19 NOTE — Progress Notes (Addendum)
 Pharmacy: Dofetilide  (Tikosyn ) - Initial Consult Assessment and Electrolyte Replacement  Pharmacy consulted to assist in monitoring and replacing electrolytes in this 73 y.o. female admitted on 03/19/2024 undergoing dofetilide  initiation. First dofetilide  dose: 5/19 PM  Assessment:  Patient Exclusion Criteria: If any screening criteria checked as "Yes", then  patient  should NOT receive dofetilide  until criteria item is corrected.  If "Yes" please indicate correction plan.  YES  NO Patient  Exclusion Criteria Correction Plan/Comments   []   [x]   Baseline QTc interval is greater than or equal to 440 msec. IF above YES box checked dofetilide  contraindicated unless patient has ICD; then may proceed if QTc 500-550 msec or with known ventricular conduction abnormalities may proceed with QTc 550-600 msec. QTc = 409 ms in SR    []   [x]   Patient is known or suspected to have a digoxin level greater than 2 ng/ml: No results found for: "DIGOXIN"     []   [x]   Creatinine clearance less than 20 ml/min (calculated using Cockcroft-Gault, actual body weight and serum creatinine): Estimated CrCl 115 mL/min using ABW     []   [x]  Patient has received drugs known to prolong the QT intervals within the last 48 hour (examples: phenothiazines, tricyclics or tetracyclic antidepressants, macrolides, 1st generation H-1 antihistamines (especially diphenhydramine), fluoroquinolones, azoles, ondansetron , metoclopramide, promethazine ).   Updated information on QT prolonging agents is available to be searched on the following database:QT prolonging agents -If SSRI or antihistamine needed, preferred options are sertraline and loratadine respectively     []   [x]  Patient received a dose of a thiazide diuretic in the last 48 hours [including hydrochlorothiazide (Oretic) alone or in any combination including triamterene (Dyazide, Maxzide)].    []   [x]  Patient received a medication known to increase dofetilide   plasma concentrations prior to initial dofetilide  dose:  Trimethoprim  (Primsol, Proloprim ) in the last 36 hours Verapamil (Calan, Verelan) in the last 36 hours or a sustained release dose in the last 72 hours Megestrol (Megace) in the last 5 days  Cimetidine (Tagamet) in the last 6 hours Ketoconazole (Nizoral) in the last 24 hours Itraconazole (Sporanox) in the last 48 hours  Prochlorperazine (Compazine) in the last 36 hours     []   [x]   Patient is known to have a history of torsades de pointes; congenital or acquired long QT syndromes.    []   [x]   Patient has received a Class 1 and Class 3 antiarrhythmic with less than 2 half-lives since last dose. (Disopyramide, Quinidine, Procainamide, Lidocaine , Mexiletine, Flecainide, Propafenone, Sotalol, Dronedarone)    []   [x]   Patient has received amiodarone therapy in the past 3 months or amiodarone level is greater than 0.3 ng/ml.    Labs:    Component Value Date/Time   K 4.9 03/19/2024 1029   MG 1.3 (L) 03/19/2024 1029     Plan: Select One Calculated CrCl  Dose q12h  [x]  > 60 ml/min 500 mcg  []  40-60 ml/min 250 mcg  []  20-40 ml/min 125 mcg   [x]   Physician selected initial dose within range recommended for patients level of renal function - will monitor for response.  [x]   Physician selected initial dose outside of range recommended for patients level of renal function - will discuss if the dose should be altered at this time.   Patient has been appropriately anticoagulated with Eliquis .  Potassium: K >/= 4: Appropriate to initiate Tikosyn , no replacement needed   Repeat BMP with K 3.8 - 40 mEq x1 ordered  Magnesium :  Mg 1.3-1.7: Hold Tikosyn  initiation and give Mg 4 gm x1 IV, recheck Mg 4hr after end of infusion - repeat appropriate dose if not > 1.8   - 4g ordered as STAT; difficulties w/ getting IV access per RN, IV team consulted   Thank you for allowing pharmacy to participate in this patient's care   Kris Pester  Paytes 03/19/2024  2:01 PM

## 2024-03-19 NOTE — Progress Notes (Signed)
 Primary Care Physician: Crecencio Dodge, Candida Chalk, DO Primary Cardiologist: Bridgette Campus, MD Electrophysiologist: None  Referring Physician: Dr Deloris Fetters Andrea Marks is a 73 y.o. female with a history of DM, HLD, HTN, atrial fibrillation who presents for follow up in the [facility].  The patient was initially diagnosed with atrial fibrillation 01/08/24 after presenting to the ED after a mechanical fall in her kitchen. EMS was called and she was found to be in afib with RVR. She was transported to the ED and started on IV diltiazem  which converted her to SR. Patient was not started on stroke prevention at that time due to a very large hematoma on her left flank. A two week cardiac monitor was ordered which showed 40% afib burden.   Patient returns for follow up for atrial fibrillation and dofetilide  loading. Patient remains in atrial flutter today with elevated rates. She denies any missed doses of anticoagulation. She has held her Ozempic . No bleeding issues on anticoagulation.   Today, she  denies symptoms of palpitations, chest pain, shortness of breath, orthopnea, PND, lower extremity edema, dizziness, presyncope, syncope, snoring, daytime somnolence, bleeding, or neurologic sequela. The patient is tolerating medications without difficulties and is otherwise without complaint today.    Atrial Fibrillation Risk Factors:  she does have symptoms or diagnosis of sleep apnea. she does not have a history of rheumatic fever. she does not have a history of alcohol use. The patient does have a history of early familial atrial fibrillation or other arrhythmias. Mother had afib.   Atrial Fibrillation Management history:  Previous antiarrhythmic drugs: none Previous cardioversions: none  Previous ablations: none Anticoagulation history: Eliquis   ROS- All systems are reviewed and negative except as per the HPI above.  Past Medical History:  Diagnosis  Date   Allergy    Anxiety    Arthritis    Cataract    Depression    Diabetes mellitus    GERD (gastroesophageal reflux disease)    Hyperlipidemia    Hypertension    Obesity     Current Outpatient Medications  Medication Sig Dispense Refill   acetaminophen  (TYLENOL ) 650 MG CR tablet Take 1,300 mg by mouth 2 (two) times daily as needed for pain. For pain     apixaban  (ELIQUIS ) 5 MG TABS tablet Take 1 tablet (5 mg total) by mouth 2 (two) times daily. 180 tablet 2   atorvastatin  (LIPITOR) 20 MG tablet Take 1 tablet (20 mg total) by mouth at bedtime. 30 tablet 2   benazepril  (LOTENSIN ) 20 MG tablet Take 1 tablet (20 mg total) by mouth daily.     Continuous Glucose Receiver (FREESTYLE LIBRE 3 READER) DEVI As directed 1 each 0   Continuous Glucose Sensor (FREESTYLE LIBRE 3 SENSOR) MISC Place 1 sensor on the skin every 14 days. Use to check glucose continuously 6 each 3   diltiazem  (CARDIZEM  CD) 180 MG 24 hr capsule Take 1 capsule (180 mg total) by mouth daily. 30 capsule 4   DULoxetine  (CYMBALTA ) 60 MG capsule 1 po every day --- start after you finish 30 mg 90 capsule 3   estradiol  (ESTRACE ) 1 MG tablet TAKE 1 TABLET DAILY 90 tablet 1   ezetimibe  (ZETIA ) 10 MG tablet Take 1 tablet (10 mg total) by mouth daily. 30 tablet 2   fenofibrate  160 MG tablet TAKE 1 TABLET DAILY 90 tablet 1   insulin  aspart (NOVOLOG  FLEXPEN) 100 UNIT/ML FlexPen Inject 12-15 Units into the skin  3 (three) times daily with meals. INJECT 12 TO 15 UNITS SUBCUTANEOUSLY 3 TIMES A   DAY WITH MEALS 45 mL 3   Insulin  Glargine (BASAGLAR  KWIKPEN) 100 UNIT/ML Inject 30 Units into the skin daily.     metFORMIN  (GLUCOPHAGE ) 850 MG tablet Take 1 tablet in the AM, and 2 tablets in the PM.     omeprazole  (PRILOSEC) 20 MG capsule Take 1 capsule (20 mg total) by mouth daily. 30 capsule 3   OZEMPIC , 2 MG/DOSE, 8 MG/3ML SOPN INJECT 2MG  SUBCUTANEOUSLY  ONCE WEEKLY (EVERY 7 DAYS) AS DIRECTED 9 mL 3   tobramycin (TOBREX) 0.3 % ophthalmic  solution Place 1 drop into both eyes 4 (four) times daily. Use eye solution for 3 days after eye injections     Current Facility-Administered Medications  Medication Dose Route Frequency Provider Last Rate Last Admin   ipratropium-albuterol  (DUONEB) 0.5-2.5 (3) MG/3ML nebulizer solution 3 mL  3 mL Nebulization Q6H Copland, Skipper Dumas, MD   3 mL at 11/15/16 1330    Physical Exam: BP (!) 142/90   Pulse (!) 140   Ht 5\' 3"  (1.6 m)   Wt 132.2 kg   BMI 51.62 kg/m   GEN: Well nourished, well developed in no acute distress CARDIAC: Regular rate and rhythm, tachycardia, no murmurs, rubs, gallops RESPIRATORY:  Clear to auscultation without rales, wheezing or rhonchi  ABDOMEN: Soft, non-tender, non-distended EXTREMITIES:  No edema; No deformity    Wt Readings from Last 3 Encounters:  03/19/24 132.2 kg  03/08/24 128.4 kg  02/20/24 127.5 kg     EKG today demonstrates  Atrial flutter with 2:1 block Vent. rate 140 BPM PR interval 156 ms QRS duration 106 ms QT/QTcB 272/415 ms   Echo 01/09/24 demonstrated   1. Left ventricular ejection fraction, by estimation, is 55 to 60%. The  left ventricle has normal function. The left ventricle has no regional  wall motion abnormalities. There is mild concentric left ventricular  hypertrophy. Left ventricular diastolic parameters were normal.   2. Right ventricular systolic function is normal. The right ventricular  size is normal. Mildly increased right ventricular wall thickness.  Tricuspid regurgitation signal is inadequate for assessing PA pressure.   3. Left atrial size was mildly dilated.   4. A small pericardial effusion is present. The pericardial effusion is  posterior to the left ventricle. There is no evidence of cardiac  tamponade.   5. The mitral valve is normal in structure. No evidence of mitral valve  regurgitation.   6. The aortic valve is tricuspid. Aortic valve regurgitation is not  visualized.   7. The inferior vena cava is  normal in size with <50% respiratory  variability, suggesting right atrial pressure of 8 mmHg.    CHA2DS2-VASc Score = 4  The patient's score is based upon: CHF History: 0 HTN History: 1 Diabetes History: 1 Stroke History: 0 Vascular Disease History: 0 Age Score: 1 Gender Score: 1       ASSESSMENT AND PLAN: Paroxysmal Atrial Fibrillation/atrial flutter The patient's CHA2DS2-VASc score is 4, indicating a 4.8% annual risk of stroke.   Patient in atrial flutter today with elevated rates.  Patient presents for dofetilide  admission Continue Eliquis  5 mg BID, states no missed doses in the last 3 weeks. No recent benadryl use PharmD has screened medications for QT prolonging agents. She already changed Prozac  to Cymbalta . She has held Ozempic  in case DCCV is needed.  QTc in SR 409 ms Continue diltiazem  180 mg daily Reviewed patient's  DNR status. She confirms that she does not want CPR or intubation.   Secondary Hypercoagulable State (ICD10:  D68.69) The patient is at significant risk for stroke/thromboembolism based upon her CHA2DS2-VASc Score of 4.  Continue Apixaban  (Eliquis ). No bleeding issues.   Suspected OSA Sleep study completed 03/13/24  HTN Stable on current regimen  Obesity Body mass index is 51.62 kg/m.  Encouraged lifestyle modification   To be admitted later today once a bed becomes available.       Myrtha Ates PA-C Afib Clinic [facility] [address] [place], [place] [address] [phone]

## 2024-03-19 NOTE — H&P (Addendum)
 Primary Care Physician: Crecencio Dodge, Candida Chalk, DO Primary Cardiologist: Bridgette Campus, MD Electrophysiologist: None  Referring Physician: Dr Deloris Fetters Andrea Marks is a 73 y.o. female with a history of DM, HLD, HTN, atrial fibrillation who presents for follow up in the [facility]ibr[facility].  The patient was initially diagnosed with atrial fibrillation 01/08/24 after presenting to the ED after a mechanical fall in her kitchen. EMS was called and she was found to be in afib with RVR. She was transported to the ED and started on IV diltiazem  which converted her to SR. Patient was not started on stroke prevention at that time due to a very large hematoma on her left flank. A two week cardiac monitor was ordered which showed 40% afib burden.   Patient returns for follow up for atrial fibrillation and dofetilide  loading. Patient remains in atrial flutter today with elevated rates. She denies any missed doses of anticoagulation. She has held her Ozempic . No bleeding issues on anticoagulation.   Today, she  denies symptoms of palpitations, chest pain, shortness of breath, orthopnea, PND, lower extremity edema, dizziness, presyncope, syncope, snoring, daytime somnolence, bleeding, or neurologic sequela. The patient is tolerating medications without difficulties and is otherwise without complaint today.    Atrial Fibrillation Risk Factors:  she does have symptoms or diagnosis of sleep apnea. she does not have a history of rheumatic fever. she does not have a history of alcohol use. The patient does have a history of early familial atrial fibrillation or other arrhythmias. Mother had afib.   Atrial Fibrillation Management history:  Previous antiarrhythmic drugs: none Previous cardioversions: none  Previous ablations: none Anticoagulation history: Eliquis   ROS- All systems are reviewed and negative except as per the HPI above.  Past Medical History:  Diagnosis  Date   Allergy    Anxiety    Arthritis    Cataract    Depression    Diabetes mellitus    GERD (gastroesophageal reflux disease)    Hyperlipidemia    Hypertension    Obesity     Current Facility-Administered Medications  Medication Dose Route Frequency Provider Last Rate Last Admin   0.9 %  sodium chloride  infusion  250 mL Intravenous PRN Ardeen Kohler, MD       acetaminophen  (TYLENOL ) CR tablet 1,300 mg  1,300 mg Oral BID PRN Fenton, Clint R, PA       apixaban  (ELIQUIS ) tablet 5 mg  5 mg Oral BID Ardeen Kohler, MD       atorvastatin  (LIPITOR) tablet 20 mg  20 mg Oral QHS Fenton, Clint R, PA       [START ON 03/20/2024] Basaglar  KwikPen KwikPen 30 Units  30 Units Subcutaneous Daily Fenton, Clint R, PA       [START ON 03/20/2024] benazepril  (LOTENSIN ) tablet 20 mg  20 mg Oral Daily Fenton, Clint R, PA       [START ON 03/20/2024] diltiazem  (CARDIZEM  CD) 24 hr capsule 180 mg  180 mg Oral Daily Fenton, Clint R, PA       dofetilide  (TIKOSYN ) capsule 500 mcg  500 mcg Oral BID Ardeen Kohler, MD       [START ON 03/20/2024] DULoxetine  (CYMBALTA ) DR capsule 60 mg  60 mg Oral Daily Fenton, Clint R, PA       [START ON 03/20/2024] estradiol  (ESTRACE ) tablet 1 mg  1 mg Oral Daily Fenton, Clint R, PA       [START ON 03/20/2024] ezetimibe  (ZETIA ) tablet  10 mg  10 mg Oral Daily Fenton, Clint R, PA       [START ON 03/20/2024] fenofibrate  tablet 160 mg  160 mg Oral Daily Fenton, Clint R, PA       insulin  aspart (NOVOLOG ) FlexPen 12-15 Units  12-15 Units Subcutaneous TID WC Fenton, Clint R, PA       magnesium  sulfate IVPB 4 g 100 mL  4 g Intravenous Once Juwon Scripter Andrew, PA-C       metFORMIN  (GLUCOPHAGE ) tablet 1,700 mg  1,700 mg Oral QHS Fenton, Clint R, PA       [START ON 03/20/2024] metFORMIN  (GLUCOPHAGE ) tablet 850 mg  850 mg Oral Q breakfast Fenton, Clint R, PA       [START ON 03/20/2024] pantoprazole  (PROTONIX ) EC tablet 40 mg  40 mg Oral Daily Fenton, Clint R, PA       sodium chloride  flush (NS)  0.9 % injection 3 mL  3 mL Intravenous Q12H Ardeen Kohler, MD       sodium chloride  flush (NS) 0.9 % injection 3 mL  3 mL Intravenous PRN Ardeen Kohler, MD        Physical Exam: BP 119/68 (BP Location: Right Arm)   Pulse (!) 113   Temp 98.7 F (37.1 C) (Oral)   Resp 20   Ht 5\' 3"  (1.6 m)   Wt 131.5 kg   SpO2 92%   BMI 51.37 kg/m   GEN: Well nourished, well developed in no acute distress CARDIAC: Regular rate and rhythm, tachycardia, no murmurs, rubs, gallops RESPIRATORY:  Clear to auscultation without rales, wheezing or rhonchi  ABDOMEN: Soft, non-tender, non-distended EXTREMITIES:  No edema; No deformity    Wt Readings from Last 3 Encounters:  03/19/24 131.5 kg  03/19/24 132.2 kg  03/08/24 128.4 kg     EKG today demonstrates  Atrial flutter with 2:1 block Vent. rate 140 BPM PR interval 156 ms QRS duration 106 ms QT/QTcB 272/415 ms   Echo 01/09/24 demonstrated   1. Left ventricular ejection fraction, by estimation, is 55 to 60%. The  left ventricle has normal function. The left ventricle has no regional  wall motion abnormalities. There is mild concentric left ventricular  hypertrophy. Left ventricular diastolic parameters were normal.   2. Right ventricular systolic function is normal. The right ventricular  size is normal. Mildly increased right ventricular wall thickness.  Tricuspid regurgitation signal is inadequate for assessing PA pressure.   3. Left atrial size was mildly dilated.   4. A small pericardial effusion is present. The pericardial effusion is  posterior to the left ventricle. There is no evidence of cardiac  tamponade.   5. The mitral valve is normal in structure. No evidence of mitral valve  regurgitation.   6. The aortic valve is tricuspid. Aortic valve regurgitation is not  visualized.   7. The inferior vena cava is normal in size with <50% respiratory  variability, suggesting right atrial pressure of 8 mmHg.    CHA2DS2-VASc Score = 4   The patient's score is based upon: CHF History: 0 HTN History: 1 Diabetes History: 1 Stroke History: 0 Vascular Disease History: 0 Age Score: 1 Gender Score: 1       ASSESSMENT AND PLAN: Paroxysmal Atrial Fibrillation/atrial flutter The patient's CHA2DS2-VASc score is 4, indicating a 4.8% annual risk of stroke.   Patient in atrial flutter today with elevated rates.  Patient presents for dofetilide  admission Continue Eliquis  5 mg BID, states no missed doses in the last 3  weeks. No recent benadryl use PharmD has screened medications for QT prolonging agents. She already changed Prozac  to Cymbalta . She has held Ozempic  in case DCCV is needed.  QTc in SR 409 ms Continue diltiazem  180 mg daily Reviewed patient's DNR status. She confirms that she does not want CPR or intubation.   Secondary Hypercoagulable State (ICD10:  D68.69) The patient is at significant risk for stroke/thromboembolism based upon her CHA2DS2-VASc Score of 4.  Continue Apixaban  (Eliquis ). No bleeding issues.   Suspected OSA Sleep study completed 03/13/24  HTN Stable on current regimen  Obesity Body mass index is 51.37 kg/m.  Encouraged lifestyle modification   Pt presents for tikosyn  load.   Will need to delay first dose per protocol to supplement K. Hopefully she can still get later tonight given her early admission.   Andrea Marks [address] [place], PA-C  03/19/2024 2:03 PM

## 2024-03-20 ENCOUNTER — Telehealth: Payer: Self-pay

## 2024-03-20 ENCOUNTER — Ambulatory Visit: Payer: Self-pay | Admitting: Neurology

## 2024-03-20 DIAGNOSIS — G4733 Obstructive sleep apnea (adult) (pediatric): Secondary | ICD-10-CM

## 2024-03-20 DIAGNOSIS — I484 Atypical atrial flutter: Secondary | ICD-10-CM | POA: Diagnosis not present

## 2024-03-20 LAB — MAGNESIUM: Magnesium: 1.9 mg/dL (ref 1.7–2.4)

## 2024-03-20 LAB — BASIC METABOLIC PANEL WITH GFR
Anion gap: 8 (ref 5–15)
BUN: 18 mg/dL (ref 8–23)
CO2: 27 mmol/L (ref 22–32)
Calcium: 8.9 mg/dL (ref 8.9–10.3)
Chloride: 105 mmol/L (ref 98–111)
Creatinine, Ser: 0.9 mg/dL (ref 0.44–1.00)
GFR, Estimated: >60 mL/min (ref >60)
Glucose, Bld: 139 mg/dL — ABNORMAL HIGH (ref 70–99)
Potassium: 4.3 mmol/L (ref 3.5–5.1)
Sodium: 140 mmol/L (ref 135–145)

## 2024-03-20 LAB — GLUCOSE, CAPILLARY
Glucose-Capillary: 154 mg/dL — ABNORMAL HIGH (ref 70–99)
Glucose-Capillary: 168 mg/dL — ABNORMAL HIGH (ref 70–99)

## 2024-03-20 MED ORDER — MAGNESIUM SULFATE 2 GM/50ML IV SOLN
2.0000 g | Freq: Once | INTRAVENOUS | Status: AC
  Administered 2024-03-20: 2 g via INTRAVENOUS
  Filled 2024-03-20: qty 50

## 2024-03-20 MED ORDER — DOFETILIDE 500 MCG PO CAPS
500.0000 ug | ORAL_CAPSULE | Freq: Two times a day (BID) | ORAL | Status: DC
  Administered 2024-03-20 (×2): 500 ug via ORAL
  Filled 2024-03-20 (×2): qty 1

## 2024-03-20 NOTE — Procedures (Signed)
 [facility]  HOME SLEEP TEST (Watch PAT) REPORT  STUDY DATE: 03/13/2024  DOB: Oct 25, 1951  MRN: [id number]  ORDERING CLINICIAN: Debbra Fairy, MD, PhD   REFERRING CLINICIAN: Crecencio Dodge, Candida Chalk, DO   CLINICAL INFORMATION/HISTORY: 73 year old female with an underlying medical history of vitamin B12 deficiency, reflux disease, diabetes, paroxysmal A-fib, allergies, anxiety, depression, hypertension, hyperlipidemia, and morbid obesity with a BMI of over 45, who reports snoring and excessive daytime somnolence.   Epworth sleepiness score: 7/24.  BMI: 49.2 kg/m  FINDINGS:   Sleep Summary:   Total Recording Time (hours, min): 11 hours, 53 min  Total Sleep Time (hours, min):  10 hours, 56 min  Percent REM (%):    23%   Respiratory Indices:   Calculated pAHI (per hour):  6.4/hour         REM pAHI:    1.2/hour       NREM pAHI: 8.1/hour  Central pAHI: 0/hour  Oxygen Saturation Statistics:    Oxygen Saturation (%) Mean: 92%   Minimum oxygen saturation (%):                 82%   O2 Saturation Range (%): 82 - 97%    O2 Saturation (minutes) <=88%: 2.1 min  Pulse Rate Statistics:   Pulse Mean (bpm):    96/min    Pulse Range (62 - 139/min)   IMPRESSION: OSA (obstructive sleep apnea), mild    RECOMMENDATION:  This home sleep test demonstrates overall mild obstructive sleep apnea with a total AHI of 6.4/hour - utilizing the 4% desaturation criteria for obstructive hypopneas per Medicare guidelines - and O2 nadir of 82% without any significant time below 89% saturation (2 min). Snoring was detected, in the mild to moderate range. Given the patient's medical history and sleep related complaints, therapy with a positive airway pressure device is recommended. Treatment can be achieved in the form of autoPAP trial/titration at home for now. A full night, in-lab PAP titration study may aid in improving proper treatment settings and with mask fit, if needed,  down the road. Alternative treatments may include weight loss (where appropriate) along with avoidance of the supine sleep position (if possible), or an oral appliance in appropriate candidates.   Please note that untreated obstructive sleep apnea may carry additional perioperative morbidity. Patients with significant obstructive sleep apnea should receive perioperative PAP therapy and the surgeons and particularly the anesthesiologist should be informed of the diagnosis and the severity of the sleep disordered breathing. The patient should be cautioned not to drive, work at heights, or operate dangerous or heavy equipment when tired or sleepy. Review and reiteration of good sleep hygiene measures should be pursued with any patient. Other causes of the patient's symptoms, including circadian rhythm disturbances, an underlying mood disorder, medication effect and/or an underlying medical problem cannot be ruled out based on this test. Clinical correlation is recommended.  The patient and her referring provider will be notified of the test results. The patient will be seen in follow up in sleep clinic at [facility], as necessary.  I certify that I have reviewed the raw data recording prior to the issuance of this report in accordance with the standards of the American Academy of Sleep Medicine (AASM).  INTERPRETING PHYSICIAN:   Debbra Fairy, MD, PhD Medical Director, [facility]) Diplomat, ABPN (Neurology and Sleep)   [facility] [address] [place], [place] [address] [phone]

## 2024-03-20 NOTE — Progress Notes (Signed)
   03/20/24 0751  Assess: MEWS Score  Temp 98.7 F (37.1 C)  BP (!) 137/92  MAP (mmHg) 105  Pulse Rate (!) 112  Resp 18  SpO2 93 %  O2 Device Room Air  Assess: MEWS Score  MEWS Temp 0  MEWS Systolic 0  MEWS Pulse 2  MEWS RR 0  MEWS LOC 0  MEWS Score 2  MEWS Score Color Yellow  Assess: if the MEWS score is Yellow or Red  Were vital signs accurate and taken at a resting state? Yes  Does the patient meet 2 or more of the SIRS criteria? No  MEWS guidelines implemented  Yes, yellow  Treat  MEWS Interventions Considered administering scheduled or prn medications/treatments as ordered  Take Vital Signs  Increase Vital Sign Frequency  Yellow: Q2hr x1, continue Q4hrs until patient remains green for 12hrs  Escalate  MEWS: Escalate Yellow: Discuss with charge nurse and consider notifying provider and/or RRT  Notify: Charge Nurse/RN  Name of Charge Nurse/RN Notified Purcell Bruce  Assess: SIRS CRITERIA  SIRS Temperature  0  SIRS Respirations  0  SIRS Pulse 1  SIRS WBC 0  SIRS Score Sum  1

## 2024-03-20 NOTE — Plan of Care (Signed)

## 2024-03-20 NOTE — Progress Notes (Addendum)
 Electrophysiology Rounding Note  Patient Name: Andrea Marks Date of Encounter: 03/20/2024  Primary Cardiologist: Bridgette Campus, MD  Electrophysiologist: None    Subjective   Pt remains in afib on Tikosyn  500 mcg BID   QTc from EKG last pm shows stable QTc, though was performed outside of the window.   The patient is doing well today.  At this time, the patient denies chest pain, shortness of breath, or any new concerns.  Inpatient Medications    Scheduled Meds:  apixaban   5 mg Oral BID   atorvastatin   20 mg Oral QHS   benazepril   20 mg Oral Daily   diltiazem   180 mg Oral Daily   dofetilide   500 mcg Oral BID   DULoxetine   60 mg Oral Daily   estradiol   1 mg Oral Daily   ezetimibe   10 mg Oral Daily   fenofibrate   160 mg Oral Daily   insulin  aspart  0-20 Units Subcutaneous TID WC   insulin  aspart  0-5 Units Subcutaneous QHS   insulin  aspart  6 Units Subcutaneous TID WC   insulin  glargine-yfgn  25 Units Subcutaneous QHS   metFORMIN   1,700 mg Oral QHS   metFORMIN   850 mg Oral Q breakfast   pantoprazole   40 mg Oral Daily   sodium chloride  flush  3 mL Intravenous Q12H   Continuous Infusions:  sodium chloride      PRN Meds: sodium chloride , acetaminophen , sodium chloride  flush   Vital Signs    Vitals:   03/19/24 2016 03/20/24 0034 03/20/24 0422 03/20/24 0751  BP: 132/83 (!) 143/84 (!) 166/98 (!) 137/92  Pulse: (!) 112  (!) 109 (!) 112  Resp: 18 18 20 18   Temp: 98.9 F (37.2 C) 98.5 F (36.9 C) 98.9 F (37.2 C) 98.7 F (37.1 C)  TempSrc: Oral Oral Oral Oral  SpO2: 95% 96% 92% 93%  Weight:      Height:        Intake/Output Summary (Last 24 hours) at 03/20/2024 0757 Last data filed at 03/19/2024 1442 Gross per 24 hour  Intake 360 ml  Output --  Net 360 ml   Filed Weights   03/19/24 1306  Weight: 131.5 kg    Physical Exam    GEN- NAD, A&O x 3. Normal affect.  Lungs- CTAB, Normal effort.  Heart- Irregularly irregular rate and rhythm. No  M/G/R GI- Soft, NT, ND Extremities- No clubbing, cyanosis, or edema Skin- no rash or lesion  Labs    CBC No results for input(s): "WBC", "NEUTROABS", "HGB", "HCT", "MCV", "PLT" in the last 72 hours. Basic Metabolic Panel Recent Labs    96/04/54 1329 03/19/24 2155  NA 140 141  K 3.8 4.2  CL 104 103  CO2 27 26  GLUCOSE 61* 233*  BUN 23 23  CREATININE 0.92 0.98  CALCIUM  9.3 9.3  MG 1.3* 2.2    Telemetry    AF/AFL in 100-110s (personally reviewed)  Patient Profile     Andrea Marks is a 73 y.o. female with a past medical history significant for persistent atrial fibrillation.  They were admitted for tikosyn  load.   Assessment & Plan    Persistent atrial fibrillation Atrial Flutter Pt remains in flutter on Tikosyn  500 mcg BID  Continue Eliquis  Creatinine, ser  0.98 (05/19 2155) Magnesium   2.2 (05/19 2155) Potassium4.2 (05/19 2155) No electrolyte supplementation needed  If pt does not convert chemically, plan on DCCV tomorrow    For questions or updates, please contact  [facility] Please consult [email or web address] for contact info under Cardiology/STEMI.  Signed, Tylene Galla, PA-C  03/20/2024, 7:57 AM

## 2024-03-20 NOTE — Progress Notes (Signed)
 Morning EKG reviewed     Shows has converted to NSR with stable QTc at ~460 ms.  Continue  Tikosyn  500 mcg BID.   Potassium4.3 (05/20 0631) Magnesium   1.9 (05/20 0631) Creatinine, ser  0.90 (05/20 0631)  Plan for home Thursday if QTc remains stable   Tylene Galla, [place]  03/20/2024 4:49 PM

## 2024-03-20 NOTE — Progress Notes (Signed)
 Pharmacy: Dofetilide  (Tikosyn ) - Follow Up Assessment and Electrolyte Replacement  Pharmacy consulted to assist in monitoring and replacing electrolytes in this 73 y.o. female admitted on 03/19/2024 undergoing dofetilide  initiation. First dofetilide  dose: 500 mcg BID.  Labs:    Component Value Date/Time   K 4.3 03/20/2024 0631   MG 1.9 03/20/2024 0631     Plan: Potassium: K >/= 4: No additional supplementation needed  Magnesium : Mg 1.8-2: Give Mg 2 gm IV x1    Thank you for allowing pharmacy to participate in this patient's care   Cheryll Corti, [facility] Clinical Pharmacist  03/20/2024 10:14 AM   [facility] pharmacy phone numbers are listed on [email or web address]

## 2024-03-21 ENCOUNTER — Ambulatory Visit: Payer: Medicare Other | Admitting: Podiatry

## 2024-03-21 ENCOUNTER — Encounter (HOSPITAL_COMMUNITY)
Admission: AD | Disposition: A | Discharge status: Home / Self Care | Payer: Self-pay | Source: Ambulatory Visit | Attending: Cardiology

## 2024-03-21 DIAGNOSIS — I484 Atypical atrial flutter: Secondary | ICD-10-CM | POA: Diagnosis not present

## 2024-03-21 LAB — BASIC METABOLIC PANEL WITH GFR
Anion gap: 5 (ref 5–15)
BUN: 19 mg/dL (ref 8–23)
CO2: 27 mmol/L (ref 22–32)
Calcium: 8.6 mg/dL — ABNORMAL LOW (ref 8.9–10.3)
Chloride: 105 mmol/L (ref 98–111)
Creatinine, Ser: 1 mg/dL (ref 0.44–1.00)
GFR, Estimated: 59 mL/min — ABNORMAL LOW (ref >60)
Glucose, Bld: 183 mg/dL — ABNORMAL HIGH (ref 70–99)
Potassium: 4.7 mmol/L (ref 3.5–5.1)
Sodium: 137 mmol/L (ref 135–145)

## 2024-03-21 LAB — GLUCOSE, CAPILLARY
Glucose-Capillary: 107 mg/dL — ABNORMAL HIGH (ref 70–99)
Glucose-Capillary: 199 mg/dL — ABNORMAL HIGH (ref 70–99)
Glucose-Capillary: 155 mg/dL — ABNORMAL HIGH (ref 70–99)
Glucose-Capillary: 113 mg/dL — ABNORMAL HIGH (ref 70–99)

## 2024-03-21 LAB — MAGNESIUM: Magnesium: 2 mg/dL (ref 1.7–2.4)

## 2024-03-21 SURGERY — CARDIOVERSION (CATH LAB)
Anesthesia: General

## 2024-03-21 MED ORDER — DOFETILIDE 500 MCG PO CAPS
500.0000 ug | ORAL_CAPSULE | Freq: Two times a day (BID) | ORAL | Status: DC
  Administered 2024-03-21 – 2024-03-22 (×3): 500 ug via ORAL
  Filled 2024-03-21 (×3): qty 1

## 2024-03-21 MED ORDER — MAGNESIUM SULFATE 2 GM/50ML IV SOLN
2.0000 g | Freq: Once | INTRAVENOUS | Status: AC
  Administered 2024-03-21: 2 g via INTRAVENOUS
  Filled 2024-03-21: qty 50

## 2024-03-21 NOTE — Plan of Care (Signed)

## 2024-03-21 NOTE — Progress Notes (Signed)
 Electrophysiology Rounding Note  Patient Name: Andrea Marks Date of Encounter: 03/21/2024  Primary Cardiologist: None  Electrophysiologist: None    Subjective   Pt converted to sinus rhythm on Tikosyn  500 mcg BID   QTc from EKG last pm shows stable QTc at ~470  The patient is doing well today.  At this time, the patient denies chest pain, shortness of breath, or any new concerns.  Inpatient Medications    Scheduled Meds:  apixaban   5 mg Oral BID   atorvastatin   20 mg Oral QHS   benazepril   20 mg Oral Daily   diltiazem   180 mg Oral Daily   dofetilide   500 mcg Oral BID   DULoxetine   60 mg Oral Daily   estradiol   1 mg Oral Daily   ezetimibe   10 mg Oral Daily   fenofibrate   160 mg Oral Daily   insulin  aspart  0-20 Units Subcutaneous TID WC   insulin  aspart  0-5 Units Subcutaneous QHS   insulin  aspart  6 Units Subcutaneous TID WC   insulin  glargine-yfgn  25 Units Subcutaneous QHS   metFORMIN   1,700 mg Oral QHS   metFORMIN   850 mg Oral Q breakfast   pantoprazole   40 mg Oral Daily   sodium chloride  flush  3 mL Intravenous Q12H   Continuous Infusions:  magnesium  sulfate bolus IVPB     PRN Meds: acetaminophen , sodium chloride  flush   Vital Signs    Vitals:   03/20/24 1157 03/20/24 1617 03/20/24 2021 03/20/24 2346  BP: 130/63 (!) 144/70 135/70 136/70  Pulse: 82  77 77  Resp: 18 18 16 18   Temp: 98.6 F (37 C) 98.6 F (37 C) 97.9 F (36.6 C) 97.9 F (36.6 C)  TempSrc: Oral Oral Oral Oral  SpO2: 94%  96% 96%  Weight:      Height:        Intake/Output Summary (Last 24 hours) at 03/21/2024 0729 Last data filed at 03/20/2024 1900 Gross per 24 hour  Intake 890 ml  Output --  Net 890 ml   Filed Weights   03/19/24 1306  Weight: 131.5 kg    Physical Exam    GEN- NAD, A&O x 3. Normal affect.  Lungs- CTAB, Normal effort.  Heart- Regular rate and rhythm. No M/G/R GI- Soft, NT, ND Extremities- No clubbing, cyanosis, or edema Skin- no rash or  lesion  Labs    CBC No results for input(s): "WBC", "NEUTROABS", "HGB", "HCT", "MCV", "PLT" in the last 72 hours. Basic Metabolic Panel Recent Labs    16/10/96 0631 03/21/24 0434  NA 140 137  K 4.3 4.7  CL 105 105  CO2 27 27  GLUCOSE 139* 183*  BUN 18 19  CREATININE 0.90 1.00  CALCIUM  8.9 8.6*  MG 1.9 2.0    Telemetry    NSR 70-80s (personally reviewed)  Patient Profile     Andrea Marks is a 73 y.o. female with a past medical history significant for persistent atrial fibrillation.  They were admitted for tikosyn  load.   Assessment & Plan    Persistent atrial fibrillation Atrial Flutter Pt remains in NSR on Tikosyn  500 mcg BID  Continue Eliquis  Creatinine, ser  1.00 (05/21 0434) Magnesium   2.0 (05/21 0434) Potassium4.7 (05/21 0434) No electrolyte supplementation needed  Plan for home Thursday if QTc remains stable.   For questions or updates, please contact [facility] Please consult [email or web address] for contact info under Cardiology/STEMI.  Signed, Tylene Galla, PA-C  03/21/2024,  7:29 AM

## 2024-03-21 NOTE — TOC CM/SW Note (Signed)
 Transition of Care [facility]) - Inpatient Brief Assessment   Patient Details  Name: Andrea Marks MRN: [id number] Date of Birth: [birth year]  Transition of Care [facility]) CM/SW Contact:    Cosimo Diones, RN Phone Number: 03/21/2024, 4:04 PM   Clinical Narrative: Patient presented for Tikosyn  Load. Case Manager spoke with the patient regarding co pay cost. Patient is not agreeable to cost and wants to use Good Rx. Patient would like to have the initial Rx filled via [facility] and the Rx refills 90 day supply escribed to [facility]. No further needs identified at this time.    Transition of Care Asessment: Insurance and Status: Insurance coverage has been reviewed Patient has primary care physician: Yes Home environment has been reviewed: reviewed Prior level of function:: independent Prior/Current Home Services: No current home services Social Drivers of Health Review: SDOH reviewed no interventions necessary Readmission Marks has been reviewed: Yes Transition of care needs: no transition of care needs at this time

## 2024-03-21 NOTE — Progress Notes (Signed)
 Mobility Specialist Progress Note;   03/21/24 1131  Mobility  Activity Ambulated with assistance in hallway  Level of Assistance Standby assist, set-up cues, supervision of patient - no hands on  Assistive Device Four wheel walker  Distance Ambulated (ft) 225 ft  Activity Response Tolerated well  Mobility Referral Yes  Mobility visit 1 Mobility  Mobility Specialist Start Time (ACUTE ONLY) 1131  Mobility Specialist Stop Time (ACUTE ONLY) 1145  Mobility Specialist Time Calculation (min) (ACUTE ONLY) 14 min   Pt eager for mobility. Required no physical assistance during ambulation, SV. VSS throughout. C/o BLE swelling, feet elevated at [facility]. Pt returned back to chair with all needs met, call bell in reach.   Janit Meline Mobility Specialist Please contact via SecureChat or [employer] [phone]

## 2024-03-21 NOTE — Plan of Care (Signed)
  Problem: Education: Goal: Knowledge of General Education information will improve Description: Including pain rating scale, medication(s)/side effects and non-pharmacologic comfort measures 03/21/2024 0726 by Lajoyce Pikes, RN Outcome: Progressing 03/21/2024 0726 by Lajoyce Pikes, RN Outcome: Progressing   Problem: Health Behavior/Discharge Planning: Goal: Ability to manage health-related needs will improve 03/21/2024 0726 by Lajoyce Pikes, RN Outcome: Progressing 03/21/2024 0726 by Lajoyce Pikes, RN Outcome: Progressing   Problem: Clinical Measurements: Goal: Ability to maintain clinical measurements within normal limits will improve 03/21/2024 0726 by Lajoyce Pikes, RN Outcome: Progressing 03/21/2024 0726 by Lajoyce Pikes, RN Outcome: Progressing Goal: Will remain free from infection 03/21/2024 0726 by Lajoyce Pikes, RN Outcome: Progressing 03/21/2024 0726 by Lajoyce Pikes, RN Outcome: Progressing Goal: Diagnostic test results will improve 03/21/2024 0726 by Lajoyce Pikes, RN Outcome: Progressing 03/21/2024 0726 by Lajoyce Pikes, RN Outcome: Progressing Goal: Respiratory complications will improve 03/21/2024 0726 by Lajoyce Pikes, RN Outcome: Progressing 03/21/2024 0726 by Lajoyce Pikes, RN Outcome: Progressing Goal: Cardiovascular complication will be avoided 03/21/2024 0726 by Lajoyce Pikes, RN Outcome: Progressing 03/21/2024 0726 by Lajoyce Pikes, RN Outcome: Progressing

## 2024-03-21 NOTE — Progress Notes (Signed)
 Morning EKG reviewed     Shows remains in NSR with stable QTc at ~ 480 ms.  Continue  Tikosyn  500 mcg BID.   Potassium4.7 (05/21 0434) Magnesium   2.0 (05/21 0434) Creatinine, ser  1.00 (05/21 0434)  Plan for home Thursday if QTc remains stable   Tylene Galla, [place]  03/21/2024 12:10 PM

## 2024-03-21 NOTE — Progress Notes (Signed)
 Pharmacy: Dofetilide  (Tikosyn ) - Follow Up Assessment and Electrolyte Replacement  Pharmacy consulted to assist in monitoring and replacing electrolytes in this 73 y.o. female admitted on 03/19/2024 undergoing dofetilide  initiation. First dofetilide  dose: 500 mcg BID 5/19 PM.  Labs:    Component Value Date/Time   K 4.7 03/21/2024 0434   MG 2.0 03/21/2024 0434     Plan: Potassium: K >/= 4: No additional supplementation needed  Magnesium : Mg 1.8-2: Give Mg 2 gm IV x1    Thank you for allowing pharmacy to participate in this patient's care   Cecillia Cogan, PharmD Clinical Pharmacist 03/21/2024  7:11 AM

## 2024-03-22 ENCOUNTER — Other Ambulatory Visit (HOSPITAL_COMMUNITY): Payer: Self-pay

## 2024-03-22 DIAGNOSIS — I484 Atypical atrial flutter: Secondary | ICD-10-CM | POA: Diagnosis not present

## 2024-03-22 LAB — BASIC METABOLIC PANEL WITH GFR
Anion gap: 8 (ref 5–15)
BUN: 18 mg/dL (ref 8–23)
CO2: 25 mmol/L (ref 22–32)
Calcium: 8.5 mg/dL — ABNORMAL LOW (ref 8.9–10.3)
Chloride: 105 mmol/L (ref 98–111)
Creatinine, Ser: 0.94 mg/dL (ref 0.44–1.00)
GFR, Estimated: >60 mL/min (ref >60)
Glucose, Bld: 152 mg/dL — ABNORMAL HIGH (ref 70–99)
Potassium: 4.8 mmol/L (ref 3.5–5.1)
Sodium: 138 mmol/L (ref 135–145)

## 2024-03-22 LAB — GLUCOSE, CAPILLARY
Glucose-Capillary: 165 mg/dL — ABNORMAL HIGH (ref 70–99)
Glucose-Capillary: 162 mg/dL — ABNORMAL HIGH (ref 70–99)

## 2024-03-22 LAB — MAGNESIUM: Magnesium: 2 mg/dL (ref 1.7–2.4)

## 2024-03-22 MED ORDER — MAGNESIUM OXIDE 400 MG PO TABS
400.0000 mg | ORAL_TABLET | Freq: Every day | ORAL | 6 refills | Status: DC
  Filled 2024-03-22: qty 30, 30d supply, fill #0

## 2024-03-22 MED ORDER — DOFETILIDE 500 MCG PO CAPS
500.0000 ug | ORAL_CAPSULE | Freq: Two times a day (BID) | ORAL | 6 refills | Status: DC
Start: 2024-03-22 — End: 2024-04-02
  Filled 2024-03-22: qty 60, 30d supply, fill #0

## 2024-03-22 MED ORDER — MAGNESIUM SULFATE 2 GM/50ML IV SOLN
2.0000 g | Freq: Once | INTRAVENOUS | Status: AC
  Administered 2024-03-22: 2 g via INTRAVENOUS
  Filled 2024-03-22: qty 50

## 2024-03-22 NOTE — Progress Notes (Addendum)
 EKG from yesterday evening 03/21/2024 reviewed     Shows remains in NSR with stable QTc.   Having paroxysms of AF and periods of Sinus this am.   Continue  Tikosyn  500 mcg BID.   Potassium4.8 (05/22 0419) Magnesium   2.0 (05/22 0419) Creatinine, ser  0.94 (05/22 0419)  Plan for home this afternoon if QTc remains stable.   Tylene Galla, PA-C  03/22/2024 7:49 AM

## 2024-03-22 NOTE — Plan of Care (Signed)
  Problem: Activity: Goal: Risk for activity intolerance will decrease Outcome: Progressing   Problem: Pain Managment: Goal: General experience of comfort will improve and/or be controlled Outcome: Progressing   Problem: Safety: Goal: Ability to remain free from injury will improve Outcome: Progressing   Problem: Skin Integrity: Goal: Risk for impaired skin integrity will decrease Outcome: Progressing

## 2024-03-22 NOTE — Progress Notes (Signed)
 AVS given and reviewed with pt. Medications discussed. Pt aware to pick meds up from [facility] pharmacy. All questions answered to satisfaction. Pt verbalized understanding of information given. Pt to be escorted off the unit with all belongings via wheelchair by volunteer services.

## 2024-03-22 NOTE — Progress Notes (Addendum)
 Pharmacy: Dofetilide  (Tikosyn ) - Follow Up Assessment and Electrolyte Replacement  Pharmacy consulted to assist in monitoring and replacing electrolytes in this 73 y.o. female admitted on 03/19/2024 undergoing dofetilide  initiation. First dofetilide  dose: 500 mcg BID 5/19 PM.  Labs:    Component Value Date/Time   K 4.8 03/22/2024 0419   MG 2.0 03/22/2024 0419     Plan: Potassium: K >/= 4: No additional supplementation needed  Magnesium : Mg 1.8-2: Give Mg 2 gm IV x1   Patient has required 2g IV Mg daily (4g on admission for Mg 1.3) -- recommend discharging on magox 400 mg BID.  Thank you for allowing pharmacy to participate in this patient's care   Cecillia Cogan, PharmD Clinical Pharmacist 03/22/2024  7:08 AM

## 2024-03-22 NOTE — Discharge Instructions (Signed)
 Dofetilide Capsules What is this medication? DOFETILIDE (doe FET il ide) treats a fast or irregular heartbeat (arrhythmia). It works by slowing down overactive electric signals in the heart, which stabilizes your heart rhythm. It belongs to a group of medications called antiarrhythmics. This medicine may be used for other purposes; ask your health care provider or pharmacist if you have questions. COMMON BRAND NAME(S): Tikosyn What should I tell my care team before I take this medication? They need to know if you have any of these conditions: Heart disease History of irregular heartbeat History of low levels of potassium or magnesium in the blood Kidney disease Liver disease An unusual or allergic reaction to dofetilide, other medications, foods, dyes, or preservatives Pregnant or trying to get pregnant Breast-feeding How should I use this medication? Take this medication by mouth with a glass of water. Follow the directions on the prescription label. Do not take with grapefruit juice. You can take it with or without food. If it upsets your stomach, take it with food. Take your medication at regular intervals. Do not take it more often than directed. Do not stop taking except on your care team's advice. A special MedGuide will be given to you by the pharmacist with each prescription and refill. Be sure to read this information carefully each time. Talk to your care team about the use of this medication in children. Special care may be needed. Overdosage: If you think you have taken too much of this medicine contact a poison control center or emergency room at once. NOTE: This medicine is only for you. Do not share this medicine with others. What if I miss a dose? If you miss a dose, skip it. Take your next dose at the normal time. Do not take extra or 2 doses at the same time to make up for the missed dose. What may interact with this medication? Do not take this medication with any of the  following: Benadryl (Diphenhydramine) Cimetidine Cisapride Dolutegravir Dronedarone Erdafitinib Hydrochlorothiazide Immodium Ketoconazole Megestrol Pimozide Prochlorperazine Thioridazine Trimethoprim Verapamil This medication may also interact with the following: Amiloride Cannabinoids Certain antibiotics like erythromycin or clarithromycin Certain antiviral medications for HIV or hepatitis Certain medications for depression, anxiety, or psychotic disorders Digoxin Diltiazem Grapefruit juice Metformin Nefazodone Other medications that prolong the QT interval (an abnormal heart rhythm) Quinine Triamterene Zafirlukast Ziprasidone This list may not describe all possible interactions. Give your health care provider a list of all the medicines, herbs, non-prescription drugs, or dietary supplements you use. Also tell them if you smoke, drink alcohol, or use illegal drugs. Some items may interact with your medicine. What should I watch for while using this medication? Your condition will be monitored carefully while you are receiving this medication. What side effects may I notice from receiving this medication? Side effects that you should report to your care team as soon as possible: Allergic reactions--skin rash, itching, hives, swelling of the face, lips, tongue, or throat Chest pain Heart rhythm changes--fast or irregular heartbeat, dizziness, feeling faint or lightheaded, chest pain, trouble breathing Side effects that usually do not require medical attention (report to your care team if they continue or are bothersome): Dizziness Headache Nausea Stomach pain Trouble sleeping This list may not describe all possible side effects. Call your doctor for medical advice about side effects. You may report side effects to FDA at 1-800-FDA-1088. Where should I keep my medication? Keep out of the reach of children. Store at room temperature between 15 and 30 degrees  C (59 and 86  degrees F). Throw away any unused medication after the expiration date. NOTE: This sheet is a summary. It may not cover all possible information. If you have questions about this medicine, talk to your doctor, pharmacist, or health care provider.  2024 Elsevier/Gold Standard (2021-09-18 00:00:00)

## 2024-03-22 NOTE — Progress Notes (Signed)
 Mobility Specialist Progress Note;   03/22/24 0942  Mobility  Activity Ambulated with assistance in hallway  Level of Assistance Standby assist, set-up cues, supervision of patient - no hands on  Assistive Device Four wheel walker  Distance Ambulated (ft) 225 ft  Activity Response Tolerated well  Mobility Referral Yes  Mobility visit 1 Mobility  Mobility Specialist Start Time (ACUTE ONLY) [id number]  Mobility Specialist Stop Time (ACUTE ONLY) 0956  Mobility Specialist Time Calculation (min) (ACUTE ONLY) 14 min   Pt eager for mobility. Required no physical assistance during ambulation, SV. VSS throughout and no c/o when asked. Pt returned back to bed with all needs met, call bell in reach.   Janit Meline Mobility Specialist Please contact via SecureChat or [employer] [phone]

## 2024-03-22 NOTE — Discharge Summary (Signed)
 ELECTROPHYSIOLOGY DISCHARGE SUMMARY    Patient ID: Andrea Marks,  MRN: [id number], DOB/AGE: [birth year] 73 y.o.  Admit date: 03/19/2024 Discharge date: 03/22/2024  Primary Care Physician: Estill Hemming, DO  Primary Cardiologist: None  Electrophysiologist: Dr. Marven Slimmer   Primary Discharge Diagnosis:  Persistent atrial fibrillation status post Tikosyn  loading this admission  Secondary Discharge Diagnosis:  Obesity  Allergies  Allergen Reactions   Hydrocodone-Acetaminophen  Other (See Comments)    unknown   Oxycodone Hcl Other (See Comments)    unknown   Penicillins Other (See Comments)    unknown   Prednisone      Patient has noted allergy to prednisone  but she says it was a questionable history of mild nausea with prednisone  and nothing more      Procedures This Admission:  1.  Tikosyn  loading   Brief HPI: Andrea Marks is a 73 y.o. female with a past medical history as noted above.  They were referred to EP for treatment options of atrial fibrillation.  Risks, benefits, and alternatives to Tikosyn  were reviewed with the patient who wished to proceed with admission for loading.  Hospital Course:  The patient was admitted and Tikosyn  was initiated.  Renal function and electrolytes were followed during the hospitalization.  Their QTc remained stable. The patient converted chemically and did not require cardioversion. The patients QTc remained stable. They were monitored on telemetry up to discharge. On the day of discharge, they were examined by Dr. Marven Slimmer  who considered them stable for discharge to home.  Follow-up has been arranged with the Atrial Fibrillation clinic in approximately 1 week.   Physical Exam: Vitals:   03/21/24 2122 03/22/24 0050 03/22/24 0420 03/22/24 0737  BP: (!) 148/59 (!) 142/66 129/63 (!) 140/61  Pulse: 81 90 85 74  Resp: 16 18 16 17   Temp: 98.6 F (37 C) 98.5 F (36.9 C) 98.3 F (36.8 C) 99.1 F (37.3 C)   TempSrc: Oral Oral Oral Oral  SpO2: 98% 98% 98% 96%  Weight:      Height:        GEN- NAD, A&O x 3. Normal affect.  Lungs- CTAB, Normal effort.  Heart- Irregularly irregular rate and rhythm at time fo discharge. No M/G/R GI- Soft, NT, ND Extremities- No clubbing, cyanosis, or edema Skin- no rash or lesion  Labs:   Lab Results  Component Value Date   WBC 8.5 01/20/2024   HGB 11.9 (L) 01/20/2024   HCT 37.4 01/20/2024   MCV 86.5 01/20/2024   PLT 247.0 01/20/2024    Recent Labs  Lab 03/19/24 2155 03/20/24 0631 03/22/24 0419  NA 141   < > 138  K 4.2   < > 4.8  CL 103   < > 105  CO2 26   < > 25  BUN 23   < > 18  CREATININE 0.98   < > 0.94  CALCIUM  9.3   < > 8.5*  PROT 5.3*  --   --   BILITOT 0.6  --   --   ALKPHOS 24*  --   --   ALT 9  --   --   AST 12*  --   --   GLUCOSE 233*   < > 152*   < > = values in this interval not displayed.    Discharge Medications:  Allergies as of 03/22/2024       Reactions   Hydrocodone-acetaminophen  Other (See Comments)   unknown   Oxycodone Hcl  Other (See Comments)   unknown   Penicillins Other (See Comments)   unknown   Prednisone     Patient has noted allergy to prednisone  but she says it was a questionable history of mild nausea with prednisone  and nothing more        Medication List     TAKE these medications    acetaminophen  650 MG CR tablet Commonly known as: TYLENOL  Take 1,300 mg by mouth 2 (two) times daily as needed for pain. For pain   apixaban  5 MG Tabs tablet Commonly known as: Eliquis  Take 1 tablet (5 mg total) by mouth 2 (two) times daily.   atorvastatin  20 MG tablet Commonly known as: LIPITOR Take 1 tablet (20 mg total) by mouth at bedtime.   Basaglar  KwikPen 100 UNIT/ML Inject 30 Units into the skin daily.   benazepril  20 MG tablet Commonly known as: LOTENSIN  Take 1 tablet (20 mg total) by mouth daily.   diltiazem  180 MG 24 hr capsule Commonly known as: CARDIZEM  CD Take 1 capsule (180 mg  total) by mouth daily.   dofetilide  500 MCG capsule Commonly known as: TIKOSYN  Take 1 capsule (500 mcg total) by mouth 2 (two) times daily.   DULoxetine  60 MG capsule Commonly known as: Cymbalta  1 po every day --- start after you finish 30 mg   estradiol  1 MG tablet Commonly known as: ESTRACE  TAKE 1 TABLET DAILY   ezetimibe  10 MG tablet Commonly known as: ZETIA  Take 1 tablet (10 mg total) by mouth daily.   fenofibrate  160 MG tablet TAKE 1 TABLET DAILY   FreeStyle Libre 3 Reader Devi As directed   [employer] 3 Sensor Misc Place 1 sensor on the skin every 14 days. Use to check glucose continuously   magnesium  oxide 400 MG tablet Commonly known as: MAG-OX Take 1 tablet (400 mg total) by mouth daily.   metFORMIN  850 MG tablet Commonly known as: GLUCOPHAGE  Take 1 tablet in the AM, and 2 tablets in the PM.   NovoLOG  FlexPen 100 UNIT/ML FlexPen Generic drug: insulin  aspart Inject 12-15 Units into the skin 3 (three) times daily with meals. INJECT 12 TO 15 UNITS SUBCUTANEOUSLY 3 TIMES A   DAY WITH MEALS   omeprazole  20 MG capsule Commonly known as: PRILOSEC Take 1 capsule (20 mg total) by mouth daily.   Ozempic  (2 MG/DOSE) 8 MG/3ML Sopn Generic drug: Semaglutide  (2 MG/DOSE) INJECT 2MG  SUBCUTANEOUSLY  ONCE WEEKLY (EVERY 7 DAYS) AS DIRECTED   tobramycin 0.3 % ophthalmic solution Commonly known as: TOBREX Place 1 drop into both eyes 4 (four) times daily. Use eye solution for 3 days after eye injections        Disposition:  Home with follow up in AF clinic in 1 week as in AVS.   Duration of Discharge Encounter:  APP time: 30  Signed, Tylene Galla, PA-C  03/22/2024 10:50 AM

## 2024-03-23 ENCOUNTER — Telehealth: Payer: Self-pay

## 2024-03-23 NOTE — Patient Outreach (Signed)
 RNCM Telephone Encounter Note  Received message from [facility] RN that she spoke to patient today, who communicated that she no longer wishes to be enrolled in CCM program.  RNCM disenrolled from [place] [facility], closed [facility] Care Plan, and cancelled [facility] appointment scheduled for 03/27/24, notified PCP of same.   Clarnce Crow BSN RN CCM [place]  [facility], [facility] RN Care Manager Direct Dial: [phone] Fax: [phone]

## 2024-03-23 NOTE — Transitions of Care (Post Inpatient/ED Visit) (Signed)
 03/23/2024  Name: Andrea Marks MRN: [id number] DOB: [birth year]  Today's TOC FU Call Status: Today's TOC FU Call Status:: Successful TOC FU Call Completed TOC FU Call Complete Date: 03/23/24 Patient's Name and Date of Birth confirmed.  Transition Care Management Follow-up Telephone Call Date of Discharge: 03/22/24 Discharge Facility: [facility]) Type of Discharge: Inpatient Admission Primary Inpatient Discharge Diagnosis:: Tikosyn  How have you been since you were released from the hospital?: Same (Still in A-fib) Any questions or concerns?: No  Items Reviewed: Did you receive and understand the discharge instructions provided?: Yes Medications obtained,verified, and reconciled?: Yes (Medications Reviewed) Any new allergies since your discharge?: No Dietary orders reviewed?: Yes Type of Diet Ordered:: Low sodium Heart Healthy Do you have support at home?: No  Medications Reviewed Today: Medications Reviewed Today     Reviewed by Claudene Crystal, RN (Case Manager) on 03/23/24 at 1335  Med List Status: <None>   Medication Order Taking? Sig Documenting Provider Last Dose Status Informant  acetaminophen  (TYLENOL ) 650 MG CR tablet [id number] No Take 1,300 mg by mouth 2 (two) times daily as needed for pain. For pain [provider] Past Week Active Self, Pharmacy Records  apixaban  (ELIQUIS ) 5 MG TABS tablet [id number] No Take 1 tablet (5 mg total) by mouth 2 (two) times daily. Fenton, Clint R, [place] 03/19/2024 Morning Active   atorvastatin  (LIPITOR) 20 MG tablet [id number] No Take 1 tablet (20 mg total) by mouth at bedtime. West, Katlyn D, NP 03/18/2024 Expired 03/21/24 2359   benazepril  (LOTENSIN ) 20 MG tablet [id number] No Take 1 tablet (20 mg total) by mouth daily. Modena Andes, MD 03/18/2024 Active   Continuous Glucose Receiver (FREESTYLE LIBRE 3 READER) DEVI [id number] No As directed Crecencio Dodge, Candida Chalk, DO Taking Active   Continuous Glucose Sensor (FREESTYLE  LIBRE 3 SENSOR) [place] [id number] No Place 1 sensor on the skin every 14 days. Use to check glucose continuously Crecencio Dodge, Candida Chalk, DO Taking Active   diltiazem  (CARDIZEM  CD) 180 MG 24 hr capsule [id number] No Take 1 capsule (180 mg total) by mouth daily. Fenton, Clint R, [place] 03/19/2024 Active   dofetilide  (TIKOSYN ) 500 MCG capsule [id number]  Take 1 capsule (500 mcg total) by mouth 2 (two) times daily. Tylene Galla, PA-C  Active   DULoxetine  (CYMBALTA ) 60 MG capsule [id number] No 1 po every day --- start after you finish 30 mg Lowne Chase, Yvonne R, DO 03/19/2024 Active   estradiol  (ESTRACE ) 1 MG tablet [id number] No TAKE 1 TABLET DAILY Estill Hemming, DO 03/19/2024 Active   ezetimibe  (ZETIA ) 10 MG tablet [id number] No Take 1 tablet (10 mg total) by mouth daily. West, Katlyn D, NP 03/19/2024 Expired 03/21/24 2359   fenofibrate  160 MG tablet [id number] No TAKE 1 TABLET DAILY Lowne Chase, Yvonne R, DO 03/19/2024 Active Self, Pharmacy Records  insulin  aspart (NOVOLOG  FLEXPEN) 100 UNIT/ML FlexPen [id number] No Inject 12-15 Units into the skin 3 (three) times daily with meals. INJECT 12 TO 15 UNITS SUBCUTANEOUSLY 3 TIMES A   DAY WITH MEALS Emilie Harden, MD 03/19/2024 Noon Active Self, Pharmacy Records  Insulin  Glargine (BASAGLAR  [facility]) 100 UNIT/ML [id number] No Inject 30 Units into the skin daily. [provider] 03/18/2024 Active            Med Note Joyceann No, EMMA U   Mon Mar 19, 2024  1:34 PM) Takes at night  ipratropium-albuterol  (DUONEB) 0.5-2.5 (3) MG/3ML nebulizer solution 3 mL [id number]   Copland, Skipper Dumas, MD  Active  magnesium  oxide (MAG-OX) 400 MG tablet [id number]  Take 1 tablet (400 mg total) by mouth daily. Tylene Galla, PA-C  Active   metFORMIN  (GLUCOPHAGE ) 850 MG tablet [id number] No Take 1 tablet in the AM, and 2 tablets in the PM. Modena Andes, MD 03/19/2024 Morning Active   omeprazole  (PRILOSEC) 20 MG capsule [id number] No Take 1 capsule (20 mg total) by mouth  daily. Crecencio Dodge, Adel Holt R, DO 03/19/2024 Active            Med Note Errol Heaps, LAINE M   Tue Feb 14, 2024  1:13 PM) 02/14/24: Reports during [facility] call, now taking OTC medication- reports insurance company would not cover the Rx   OZEMPIC , 2 MG/DOSE, 8 MG/3ML SOPN [id number] No INJECT 2MG  SUBCUTANEOUSLY  ONCE WEEKLY (EVERY 7 DAYS) AS DIRECTED Emilie Harden, MD 03/11/2024 Active Self, Pharmacy Records           Med Note Joyceann No, EMMA U   Mon Mar 19, 2024  1:35 PM) Instructed to hold this past sunday  tobramycin (TOBREX) 0.3 % ophthalmic solution [id number] No Place 1 drop into both eyes 4 (four) times daily. Use eye solution for 3 days after eye injections [provider] Past Month Active Self, Pharmacy Records           Med Note Joyceann No, EMMA U   Mon Mar 19, 2024  1:36 PM) she uses around her eye injections every 9 weeks            Home Care and Equipment/Supplies: Were Home Health Services Ordered?: No Any new equipment or medical supplies ordered?: No  Functional Questionnaire: Do you need assistance with bathing/showering or dressing?: No Do you need assistance with meal preparation?: No Do you need assistance with eating?: No Do you have difficulty maintaining continence: No Do you need assistance with getting out of bed/getting out of a chair/moving?: No Do you have difficulty managing or taking your medications?: No  Follow up appointments reviewed: PCP Follow-up appointment confirmed?: No (The patient does not want to schedule an appointment right now) MD Provider Line Number:[phone] Given: No Specialist Hospital Follow-up appointment confirmed?: Yes Date of Specialist follow-up appointment?: 04/02/24 Follow-Up Specialty Provider:: [place] Do you need transportation to your follow-up appointment?: No Do you understand care options if your condition(s) worsen?: Yes-patient verbalized understanding  SDOH Interventions Today    Flowsheet Row Most Recent  Value  SDOH Interventions   Food Insecurity Interventions Intervention Not Indicated  Housing Interventions Intervention Not Indicated  Transportation Interventions Intervention Not Indicated  Utilities Interventions Intervention Not Indicated       Gareld June, BSN, RN [place]  VBC[facility] - [facility] RN Care Manager [phone]

## 2024-03-27 ENCOUNTER — Telehealth: Payer: Self-pay

## 2024-03-27 NOTE — Telephone Encounter (Signed)
 Tried to reach pt (2nd attempt according to records). Phone rang multiple times and never went to VM.

## 2024-03-28 ENCOUNTER — Telehealth: Payer: Self-pay

## 2024-03-28 NOTE — Telephone Encounter (Signed)
 Will wait for CMA to return.

## 2024-03-28 NOTE — Telephone Encounter (Signed)
 Copied from CRM [id number]. Topic: Clinical - Home Health Verbal Orders >> Mar 27, 2024 11:58 AM Danna Duster wrote: Caller/Agency: Gordan Latina with [facility]  Callback Number: [phone] Service Requested: Home Health Plan of Care order signed by doctor, requested initially signature 02/16/24 and sent again on 03/09/24 Frequency: N/A  Any new concerns about the patient? No

## 2024-03-29 NOTE — Telephone Encounter (Signed)
 I called pt and relayed her results of the sleep study HST overall mild. Recommend autopap to see if would make her feel better.  She said that she is claustrophobic and not able to wear mask, she said she told Dr Omar Bibber that.  I told her the different masks, even nasal pillows, but she said that right now she cannot do anything, her first concern is afib then hip contusion (that she needs to see surgeon about), then cyst on kidney, then may consider the autopap.  I relayed the process about DME, authorization but she stopped me mid sentence. I said that she was informed I'm sure in office about not treating sleep apnea and the risks for issues brain, stroke, heart, lungs, etc she said yes. Also not sleeping on back. I told her would relay to Dr. Omar Bibber as Andrea Marks.

## 2024-03-29 NOTE — Telephone Encounter (Signed)
-----   Message from Debbra Fairy sent at 03/20/2024  4:40 PM EDT ----- Of note, patient is in the hospital currently with afib/flutter and is supposed to have a cardioversion tomorrow from what I can see.  Patient referred by PCP, seen by me on 02/16/2024, patient had HST on 03/13/2024.    Please call and notify the patient that the recent home sleep test showed obstructive sleep apnea. OSA is overall mild, but worth treating to see if she feels better after treatment. To that end I recommend treatment for this in the form of autoPAP, which means, that we don't have to bring her in for a sleep study with CPAP, but will let her try an autoPAP machine at home, through a DME company (of her choice, or as per insurance requirement). The DME representative will educate her on how to use the machine, how to put the mask on, etc. I have placed an order in the chart. Please send referral, talk to patient, send report to referring MD. We will need a FU in sleep clinic in about 2.-3 months post-PAP set up (which is usually an insurance-mandated appointment to monitor compliance), please arrange that with me or one of our NPs. Please also go over the need for compliance with treatment (including the insurance-imposed minimum compliance percentage). Thanks,   Debbra Fairy, MD, PhD [facility])

## 2024-04-01 NOTE — Progress Notes (Signed)
 Chief Complaint: No chief complaint on file.   History of Present Illness:  Andrea Marks is a 73 y.o. female who is seen in consultation from Andrea Marks, Andrea R, DO for evaluation of bilateral renal cysts.  In March of this year she sustained a fall.  She has a significant hematoma in the left flank.  She had a CT scan performed which revealed bilateral renal cysts.  There was 1 partially calcified left renal cyst noted.  These cysts were Bosniak category 1 and 2, no significant follow-up imaging was recommended.  She has never been told that she had cysts before this most recent CT scan.  She has had no flank pain other than the hematoma, and no gross hematuria.  No prior urologic history.   Past Medical History:  Past Medical History:  Diagnosis Date   Allergy    Anxiety    Arthritis    Cataract    Depression    Diabetes mellitus    GERD (gastroesophageal reflux disease)    Hyperlipidemia    Hypertension    Obesity     Past Surgical History:  Past Surgical History:  Procedure Laterality Date   ABDOMINAL HYSTERECTOMY  11/01/1989   BSO   EYE SURGERY     IR ABLATE LIVER CRYOABLATION  12/31/2021   IR RADIOLOGIST EVAL & MGMT  12/11/2021   IR RADIOLOGIST EVAL & MGMT  01/07/2022   IR RADIOLOGIST EVAL & MGMT  02/15/2022    Allergies:  Allergies  Allergen Reactions   Hydrocodone-Acetaminophen  Other (See Comments)    unknown   Oxycodone Hcl Other (See Comments)    unknown   Penicillins Other (See Comments)    unknown   Prednisone      Patient has noted allergy to prednisone  but she says it was a questionable history of mild nausea with prednisone  and nothing more     Family History:  Family History  Problem Relation Age of Onset   Macular degeneration Mother    Heart disease Mother        syncope   Aortic stenosis Mother    Lung disease Father 43       mesothelioma   Cancer Maternal Aunt        stomach   Heart disease Paternal Uncle         cabg   Diabetes Paternal Uncle    Heart disease Paternal Uncle    Diabetes Paternal Uncle    Diabetes Paternal Uncle    Heart disease Paternal Uncle    Heart disease Paternal Uncle    Heart disease Paternal Uncle    Lung cancer Other        asbestos   Diabetes Paternal Grandmother    Cancer Other        lung    Social History:  Social History   Tobacco Use   Smoking status: Former    Current packs/day: 0.00    Types: Cigarettes    Quit date: 1995    Years since quitting: 30.4    Passive exposure: Past   Smokeless tobacco: Never   Tobacco comments:    Former smoker 02/09/24  Vaping Use   Vaping status: Never Used  Substance Use Topics   Alcohol use: No   Drug use: No    Review of symptoms:  Constitutional:  Negative for unexplained weight loss, night sweats, fever, chills ENT:  Negative for nose bleeds, sinus pain, painful swallowing CV:  Negative for chest pain,  shortness of breath, exercise intolerance, palpitations, loss of consciousness Resp:  Negative for cough, wheezing, shortness of breath GI:  Negative for nausea, vomiting, diarrhea, bloody stools GU:  Positives noted in HPI; otherwise negative for gross hematuria, dysuria, urinary incontinence Neuro:  Negative for seizures, poor balance, limb weakness, slurred speech Psych:  Negative for lack of energy, depression, anxiety Endocrine:  Negative for polydipsia, polyuria, symptoms of hypoglycemia (dizziness, hunger, sweating) Allergic:  Negative for difficulty breathing or choking as a result of exposure to anything; no shellfish allergy; no allergic response (rash/itch) to materials, foods  Physical exam: There were no vitals taken for this visit. GENERAL APPEARANCE:  Well appearing, well developed, well nourished, NAD HEENT: Atraumatic, Normocephalic. NECK: Normal appearance LUNGS: Normal inspiratory and expiratory excursion HEART: Regular Rate EXTREMITIES: Moves all extremities well.  Without clubbing,  cyanosis, or edema. NEUROLOGIC:  Alert and oriented x 3, normal gait, CN II-XII grossly intact.  MENTAL STATUS:  Appropriate. SKIN:  Warm, dry and intact.    Results:   I have reviewed referring/prior physicians records  I have reviewed urinalysis  I have reviewed prior urine cultures  I reviewed prior imaging studies--CT A/P from 4.18.2025  Assessment: Bilateral renal cysts, Bosniak category 1/2, no need for follow-up   Plan: I explained to Andrea Marks that cysts are quite common in mature individuals, and that with the appearance of hers, there is no necessity for follow-up  She will return as needed

## 2024-04-02 ENCOUNTER — Encounter (HOSPITAL_COMMUNITY): Payer: Self-pay | Admitting: Internal Medicine

## 2024-04-02 ENCOUNTER — Ambulatory Visit (HOSPITAL_COMMUNITY)
Admit: 2024-04-02 | Discharge: 2024-04-02 | Disposition: A | Discharge status: Home / Self Care | Attending: Internal Medicine | Admitting: Internal Medicine

## 2024-04-02 ENCOUNTER — Other Ambulatory Visit (HOSPITAL_COMMUNITY): Payer: Self-pay

## 2024-04-02 ENCOUNTER — Ambulatory Visit (INDEPENDENT_AMBULATORY_CARE_PROVIDER_SITE_OTHER): Admitting: Urology

## 2024-04-02 ENCOUNTER — Encounter: Payer: Self-pay | Admitting: Urology

## 2024-04-02 VITALS — BP 140/60 | HR 107 | Ht 63.0 in | Wt 288.8 lb

## 2024-04-02 VITALS — BP 127/75 | HR 97 | Ht 63.0 in | Wt 288.0 lb

## 2024-04-02 DIAGNOSIS — Z5181 Encounter for therapeutic drug level monitoring: Secondary | ICD-10-CM | POA: Diagnosis not present

## 2024-04-02 DIAGNOSIS — Z79899 Other long term (current) drug therapy: Secondary | ICD-10-CM | POA: Diagnosis not present

## 2024-04-02 DIAGNOSIS — D6869 Other thrombophilia: Secondary | ICD-10-CM

## 2024-04-02 DIAGNOSIS — N281 Cyst of kidney, acquired: Secondary | ICD-10-CM

## 2024-04-02 DIAGNOSIS — I4819 Other persistent atrial fibrillation: Secondary | ICD-10-CM | POA: Diagnosis not present

## 2024-04-02 LAB — URINALYSIS, ROUTINE W REFLEX MICROSCOPIC
Bilirubin, UA: NEGATIVE
Glucose, UA: NEGATIVE
Ketones, UA: NEGATIVE
Leukocytes,UA: NEGATIVE
Nitrite, UA: NEGATIVE
RBC, UA: NEGATIVE
Specific Gravity, UA: 1.025 (ref 1.005–1.030)
Urobilinogen, Ur: 0.2 mg/dL (ref 0.2–1.0)
pH, UA: 5.5 (ref 5.0–7.5)

## 2024-04-02 LAB — MICROSCOPIC EXAMINATION

## 2024-04-02 MED ORDER — MAGNESIUM OXIDE 400 MG PO TABS
400.0000 mg | ORAL_TABLET | Freq: Every day | ORAL | 6 refills | Status: DC
  Filled 2024-04-02: qty 30, 30d supply, fill #0

## 2024-04-02 MED ORDER — DOFETILIDE 500 MCG PO CAPS
500.0000 ug | ORAL_CAPSULE | Freq: Two times a day (BID) | ORAL | 2 refills | Status: DC
  Filled 2024-04-02: qty 60, 30d supply, fill #0

## 2024-04-02 MED ORDER — DILTIAZEM HCL ER COATED BEADS 240 MG PO CP24
240.0000 mg | ORAL_CAPSULE | Freq: Every day | ORAL | 4 refills | Status: DC
Start: 2024-04-02 — End: 2024-04-02
  Filled 2024-04-02: qty 30, 30d supply, fill #0

## 2024-04-02 MED ORDER — MAGNESIUM OXIDE 400 MG PO TABS: 400.0000 mg | ORAL_TABLET | Freq: Every day | ORAL | 6 refills | Status: DC

## 2024-04-02 MED ORDER — DOFETILIDE 500 MCG PO CAPS
500.0000 ug | ORAL_CAPSULE | Freq: Two times a day (BID) | ORAL | 2 refills | Status: DC
Start: 2024-04-02 — End: 2024-08-15

## 2024-04-02 MED ORDER — DILTIAZEM HCL ER COATED BEADS 240 MG PO CP24: 240.0000 mg | ORAL_CAPSULE | Freq: Every day | ORAL | 4 refills | Status: DC

## 2024-04-02 NOTE — Patient Instructions (Signed)
 Increase Diltiazem  240mg  daily   Kardia Mobile 1 lead

## 2024-04-02 NOTE — Progress Notes (Addendum)
 Primary Care Physician: Crecencio Dodge, Candida Chalk, DO Primary Cardiologist: None Electrophysiologist: None  Referring Physician: Dr Deloris Fetters Andrea Marks is a 73 y.o. female with a history of DM, HLD, HTN, atrial fibrillation who presents for follow up in the [facility].  The patient was initially diagnosed with atrial fibrillation 01/08/24 after presenting to the ED after a mechanical fall in her kitchen. EMS was called and she was found to be in afib with RVR. She was transported to the ED and started on IV diltiazem  which converted her to SR. Patient was not started on stroke prevention at that time due to a very large hematoma on her left flank. A two week cardiac monitor was ordered which showed 40% afib burden.   On follow up 04/02/24, patient is here for 1 week Tikosyn  surveillance. S/p Tikosyn  admission 5/19-22/2025. The patient converted chemically and did not require cardioversion. Qtc remained stable. Telemetry showed sinus rhythm with some Afib. Discharged on Tikosyn  500 mcg BID. No missed doses of Tikosyn  or Eliquis .   Today, she  denies symptoms of palpitations, chest pain, shortness of breath, orthopnea, PND, lower extremity edema, dizziness, presyncope, syncope, snoring, daytime somnolence, bleeding, or neurologic sequela. The patient is tolerating medications without difficulties and is otherwise without complaint today.    Atrial Fibrillation Risk Factors:  she does have symptoms or diagnosis of sleep apnea. she does not have a history of rheumatic fever. she does not have a history of alcohol use. The patient does have a history of early familial atrial fibrillation or other arrhythmias. Mother had afib.   Atrial Fibrillation Management history:  Previous antiarrhythmic drugs: Tikosyn  Previous cardioversions: none  Previous ablations: none Anticoagulation history: Eliquis   ROS- All systems are reviewed and negative except as per  the HPI above.  Past Medical History:  Diagnosis Date   Allergy    Anxiety    Arthritis    Cataract    Depression    Diabetes mellitus    GERD (gastroesophageal reflux disease)    Hyperlipidemia    Hypertension    Obesity     Current Outpatient Medications  Medication Sig Dispense Refill   acetaminophen  (TYLENOL ) 650 MG CR tablet Take 1,300 mg by mouth 2 (two) times daily as needed for pain. For pain     apixaban  (ELIQUIS ) 5 MG TABS tablet Take 1 tablet (5 mg total) by mouth 2 (two) times daily. 180 tablet 2   atorvastatin  (LIPITOR) 20 MG tablet Take 1 tablet (20 mg total) by mouth at bedtime. 30 tablet 2   benazepril  (LOTENSIN ) 20 MG tablet Take 1 tablet (20 mg total) by mouth daily.     Continuous Glucose Receiver (FREESTYLE LIBRE 3 READER) DEVI As directed 1 each 0   Continuous Glucose Sensor (FREESTYLE LIBRE 3 SENSOR) MISC Place 1 sensor on the skin every 14 days. Use to check glucose continuously 6 each 3   DULoxetine  (CYMBALTA ) 60 MG capsule 1 po every day --- start after you finish 30 mg 90 capsule 3   estradiol  (ESTRACE ) 1 MG tablet TAKE 1 TABLET DAILY 90 tablet 1   fenofibrate  160 MG tablet TAKE 1 TABLET DAILY 90 tablet 1   insulin  aspart (NOVOLOG  FLEXPEN) 100 UNIT/ML FlexPen Inject 12-15 Units into the skin 3 (three) times daily with meals. INJECT 12 TO 15 UNITS SUBCUTANEOUSLY 3 TIMES A   DAY WITH MEALS 45 mL 3   Insulin  Glargine (BASAGLAR  KWIKPEN) 100 UNIT/ML Inject 30  Units into the skin daily.     metFORMIN  (GLUCOPHAGE ) 850 MG tablet Take 1 tablet in the AM, and 2 tablets in the PM.     omeprazole  (PRILOSEC) 20 MG capsule Take 1 capsule (20 mg total) by mouth daily. 30 capsule 3   OZEMPIC , 2 MG/DOSE, 8 MG/3ML SOPN INJECT 2MG  SUBCUTANEOUSLY  ONCE WEEKLY (EVERY 7 DAYS) AS DIRECTED 9 mL 3   tobramycin (TOBREX) 0.3 % ophthalmic solution Place 1 drop into both eyes 4 (four) times daily. Use eye solution for 3 days after eye injections     diltiazem  (CARDIZEM  CD) 240 MG 24 hr  capsule Take 1 capsule (240 mg total) by mouth daily. 30 capsule 4   dofetilide  (TIKOSYN ) 500 MCG capsule Take 1 capsule (500 mcg total) by mouth 2 (two) times daily. 180 capsule 2   ezetimibe  (ZETIA ) 10 MG tablet Take 1 tablet (10 mg total) by mouth daily. 30 tablet 2   magnesium  oxide (MAG-OX) 400 MG tablet Take 1 tablet (400 mg total) by mouth daily. 30 tablet 6   Current Facility-Administered Medications  Medication Dose Route Frequency Provider Last Rate Last Admin   ipratropium-albuterol  (DUONEB) 0.5-2.5 (3) MG/3ML nebulizer solution 3 mL  3 mL Nebulization Q6H Copland, Jessica C, MD   3 mL at 11/15/16 1330    Physical Exam: BP (!) 140/60   Pulse (!) 107   Ht 5\' 3"  (1.6 m)   Wt 131 kg   BMI 51.16 kg/m   GEN- The patient is well appearing, alert and oriented x 3 today.   Neck - no JVD or carotid bruit noted Lungs- Clear to ausculation bilaterally, normal work of breathing Heart- Irregular tachycardic rate and rhythm, no murmurs, rubs or gallops, PMI not laterally displaced Extremities- no clubbing, cyanosis, or edema Skin - no rash or ecchymosis noted    Wt Readings from Last 3 Encounters:  04/02/24 131 kg  03/23/24 131.5 kg  03/19/24 131.5 kg     EKG today demonstrates  Vent. rate 107 BPM PR interval * ms QRS duration 80 ms QT/QTcB 364/485 ms P-R-T axes * 87 53 Atrial fibrillation with rapid ventricular response Low voltage QRS Cannot rule out Anterior infarct (cited on or before 22-Mar-2024) Abnormal ECG When compared with ECG of 22-Mar-2024 10:42, No significant change was found Confirmed by Camnitz, Will ([id number]) on 04/02/2024 11:04:08 AM   Echo 01/09/24 demonstrated   1. Left ventricular ejection fraction, by estimation, is 55 to 60%. The  left ventricle has normal function. The left ventricle has no regional  wall motion abnormalities. There is mild concentric left ventricular  hypertrophy. Left ventricular diastolic parameters were normal.   2. Right  ventricular systolic function is normal. The right ventricular  size is normal. Mildly increased right ventricular wall thickness.  Tricuspid regurgitation signal is inadequate for assessing PA pressure.   3. Left atrial size was mildly dilated.   4. A small pericardial effusion is present. The pericardial effusion is  posterior to the left ventricle. There is no evidence of cardiac  tamponade.   5. The mitral valve is normal in structure. No evidence of mitral valve  regurgitation.   6. The aortic valve is tricuspid. Aortic valve regurgitation is not  visualized.   7. The inferior vena cava is normal in size with <50% respiratory  variability, suggesting right atrial pressure of 8 mmHg.    CHA2DS2-VASc Score = 4  The patient's score is based upon: CHF History: 0 HTN History: 1 Diabetes  History: 1 Stroke History: 0 Vascular Disease History: 0 Age Score: 1 Gender Score: 1       ASSESSMENT AND PLAN: Paroxysmal Atrial Fibrillation/atrial flutter The patient's CHA2DS2-VASc score is 4, indicating a 4.8% annual risk of stroke.   S/p Tikosyn  admission 5/19-22/2025.  She is currently in Afib. We discussed continuing rhythm control with Tikosyn  and allowing more time to achieve steady state of medication. Determination at follow up whether to proceed with Tikosyn . She was noted in hospital to show sinus rhythm with Afib noted prior to discharge. Diltiazem  increased to 240 mg daily for improved rate control - this may be lowered in the future once in normal rhythm.  High risk medication monitoring (ICD10: U5195107) Patient requires ongoing monitoring for anti-arrhythmic medication which has the potential to cause life threatening arrhythmias or AV block. Qtc stable. Continue Tikosyn  500 mcg BID. Bmet and mag drawn today.  Secondary Hypercoagulable State (ICD10:  D68.69) The patient is at significant risk for stroke/thromboembolism based upon her CHA2DS2-VASc Score of 4.  Continue  Apixaban  (Eliquis ).  No missed doses.  Suspected OSA Sleep study completed 03/13/24  HTN Stable today.  Obesity Body mass index is 51.16 kg/m.  Encouraged daily activity.   Follow up in 1 month for Tikosyn  surveillance.      Woody Heading, PA-C Afib Clinic [facility] [address] [place], [place] [address] [phone]

## 2024-04-03 ENCOUNTER — Ambulatory Visit (HOSPITAL_COMMUNITY): Payer: Self-pay | Admitting: Internal Medicine

## 2024-04-03 ENCOUNTER — Telehealth: Payer: Self-pay | Admitting: Family Medicine

## 2024-04-03 ENCOUNTER — Other Ambulatory Visit (HOSPITAL_COMMUNITY): Payer: Self-pay | Admitting: *Deleted

## 2024-04-03 LAB — BASIC METABOLIC PANEL WITH GFR
BUN/Creatinine Ratio: 26 (ref 12–28)
BUN: 21 mg/dL (ref 8–27)
CO2: 23 mmol/L (ref 20–29)
Calcium: 9 mg/dL (ref 8.7–10.3)
Chloride: 102 mmol/L (ref 96–106)
Creatinine, Ser: 0.82 mg/dL (ref 0.57–1.00)
Glucose: 201 mg/dL — ABNORMAL HIGH (ref 70–99)
Potassium: 5 mmol/L (ref 3.5–5.2)
Sodium: 140 mmol/L (ref 134–144)
eGFR: 75 mL/min/{1.73_m2} (ref >59)

## 2024-04-03 LAB — MAGNESIUM: Magnesium: 1.5 mg/dL — ABNORMAL LOW (ref 1.6–2.3)

## 2024-04-03 MED ORDER — MAGNESIUM OXIDE 400 MG PO TABS: 400.0000 mg | ORAL_TABLET | Freq: Two times a day (BID) | ORAL | 6 refills | Status: DC

## 2024-04-03 NOTE — Telephone Encounter (Signed)
 [facility] calloed stating that they have called on 4 or more times to get a signature. Please call them.

## 2024-04-03 NOTE — Telephone Encounter (Signed)
 Copied from CRM [id number]. Topic: Clinical - Home Health Verbal Orders >> Apr 03, 2024  3:07 PM Ivette P wrote: Caller/Agency: Maynard Spears Callback Number: [phone]  Service Requested: Occupational Therapy, Physical Therapy, and Social Worker Frequency: 1 week 9 times, 1 week 8 Times - social wants eval  Any new concerns about the patient? No

## 2024-04-04 NOTE — Telephone Encounter (Signed)
 Returned called. Advised verbal order but they stated they needed sig. Will fax orders

## 2024-04-11 ENCOUNTER — Telehealth: Payer: Self-pay

## 2024-04-11 NOTE — Telephone Encounter (Signed)
 Forms probably at home w/ PCP.

## 2024-04-11 NOTE — Telephone Encounter (Signed)
 Copied from CRM [id number]. Topic: General - Other >> Apr 11, 2024 12:15 PM Freya Jesus wrote: Reason for CRM: Lita Rieger from [facility] home health called for a status on the verbal order that was submitted in March. Advised as of 6/4 office tried to call and said will fax orders. Lita Rieger stated they are needing this urgently. Call back: [phone]

## 2024-04-17 ENCOUNTER — Other Ambulatory Visit (HOSPITAL_COMMUNITY): Payer: Self-pay | Admitting: Internal Medicine

## 2024-04-17 ENCOUNTER — Other Ambulatory Visit (HOSPITAL_BASED_OUTPATIENT_CLINIC_OR_DEPARTMENT_OTHER): Payer: Self-pay | Admitting: Family

## 2024-04-17 ENCOUNTER — Telehealth: Payer: Self-pay

## 2024-04-17 DIAGNOSIS — E782 Mixed hyperlipidemia: Secondary | ICD-10-CM

## 2024-04-17 NOTE — Telephone Encounter (Signed)
 Copied from CRM [id number]. Topic: General - Other >> Apr 17, 2024  3:17 PM Mesmerise C wrote: Reason for CRM: Sonia from [facility] trying to get status of verbal order that was sent in said she's contacted multiple times to get deadline for it is in 15 days  contacted [facility] was advised Dr.Lown Dino Frank and her CMA are out of office until Thursday please call back at [phone]

## 2024-04-20 NOTE — Telephone Encounter (Signed)
 Returned call and advised plan of care has been faxed multiple times and have received fax fails. I was given another fax number to try. [phone]. I have faxed the POC to this number and to the previous number

## 2024-04-20 NOTE — Telephone Encounter (Signed)
 POC faxed again. Fax successful

## 2024-04-20 NOTE — Telephone Encounter (Signed)
 Copied from CRM [id number]. Topic: General - Other >> Apr 17, 2024  3:17 PM Mesmerise C wrote: Reason for CRM: Sonia from [facility] [employer] trying to get status of verbal order that was sent in said she's contacted multiple times to get deadline for it is in 15 days  contacted CAL was advised Dr.Lown Dino Frank and her CMA are out of office until Thursday please call back at [phone] >> Apr 20, 2024  1:35 PM Chuck Crater wrote: Gordan Latina with [facility] stated that she never recieved the fax regarding an order. She is requesting a callback from [place]. Refax it [phone]

## 2024-04-27 ENCOUNTER — Other Ambulatory Visit (HOSPITAL_COMMUNITY): Payer: Self-pay

## 2024-04-28 ENCOUNTER — Other Ambulatory Visit (HOSPITAL_COMMUNITY): Payer: Self-pay

## 2024-05-02 ENCOUNTER — Encounter (HOSPITAL_COMMUNITY): Payer: Self-pay

## 2024-05-02 ENCOUNTER — Ambulatory Visit (HOSPITAL_COMMUNITY): Admitting: Physician Assistant

## 2024-05-02 ENCOUNTER — Ambulatory Visit: Admitting: Podiatry

## 2024-05-02 ENCOUNTER — Ambulatory Visit (HOSPITAL_COMMUNITY)
Admission: RE | Admit: 2024-05-02 | Discharge: 2024-05-02 | Disposition: A | Discharge status: Home / Self Care | Source: Ambulatory Visit | Attending: Physician Assistant | Admitting: Physician Assistant

## 2024-05-02 ENCOUNTER — Encounter (HOSPITAL_COMMUNITY): Payer: Self-pay | Admitting: Physician Assistant

## 2024-05-02 VITALS — BP 136/80 | HR 80 | Ht 63.0 in | Wt 277.6 lb

## 2024-05-02 DIAGNOSIS — D6869 Other thrombophilia: Secondary | ICD-10-CM | POA: Diagnosis not present

## 2024-05-02 DIAGNOSIS — Z5181 Encounter for therapeutic drug level monitoring: Secondary | ICD-10-CM | POA: Diagnosis not present

## 2024-05-02 DIAGNOSIS — I480 Paroxysmal atrial fibrillation: Secondary | ICD-10-CM

## 2024-05-02 DIAGNOSIS — Z79899 Other long term (current) drug therapy: Secondary | ICD-10-CM | POA: Diagnosis not present

## 2024-05-02 DIAGNOSIS — I4819 Other persistent atrial fibrillation: Secondary | ICD-10-CM

## 2024-05-02 NOTE — Progress Notes (Signed)
 Primary Care Physician: Antonio Meth, Jamee SAUNDERS, DO Primary Cardiologist: None Electrophysiologist: OLE ONEIDA HOLTS, MD  Referring Physician: Dr Alvan Record Lucianna Ostlund is a 73 y.o. female with a history of DM, HLD, HTN, atrial fibrillation who presents for follow up in the [facility].  The patient was initially diagnosed with atrial fibrillation 01/08/24 after presenting to the ED after a mechanical fall in her kitchen. EMS was called and she was found to be in afib with RVR. She was transported to the ED and started on IV diltiazem  which converted her to SR. Patient was not started on stroke prevention at that time due to a very large hematoma on her left flank. A two week cardiac monitor was ordered which showed 40% afib burden.   Patient is s/p Tikosyn  admission 5/19-22/2025. The patient converted chemically and did not require cardioversion.   Patient returns for follow up for atrial fibrillation and dofetilide  monitoring. Patient reports that she feels well from a cardiac standpoint. She is in SR today. No bleeding issues on anticoagulation.   Today, she  denies symptoms of palpitations, chest pain, shortness of breath, orthopnea, PND, lower extremity edema, dizziness, presyncope, syncope, bleeding, or neurologic sequela. The patient is tolerating medications without difficulties and is otherwise without complaint today.    Atrial Fibrillation Risk Factors:  she does have symptoms or diagnosis of sleep apnea. she does not have a history of rheumatic fever. she does not have a history of alcohol use. The patient does have a history of early familial atrial fibrillation or other arrhythmias. Mother had afib.   Atrial Fibrillation Management history:  Previous antiarrhythmic drugs: Tikosyn  Previous cardioversions: none  Previous ablations: none Anticoagulation history: Eliquis   ROS- All systems are reviewed and negative except as per the HPI  above.  Past Medical History:  Diagnosis Date   Allergy    Anxiety    Arthritis    Cataract    Depression    Diabetes mellitus    GERD (gastroesophageal reflux disease)    Hyperlipidemia    Hypertension    Obesity     Current Outpatient Medications  Medication Sig Dispense Refill   acetaminophen  (TYLENOL ) 650 MG CR tablet Take 1,300 mg by mouth 2 (two) times daily as needed for pain. For pain     apixaban  (ELIQUIS ) 5 MG TABS tablet Take 1 tablet (5 mg total) by mouth 2 (two) times daily. 180 tablet 2   atorvastatin  (LIPITOR) 20 MG tablet TAKE 1 TABLET BY MOUTH EVERYDAY AT BEDTIME 90 tablet 1   benazepril  (LOTENSIN ) 20 MG tablet Take 1 tablet (20 mg total) by mouth daily.     Continuous Glucose Receiver (FREESTYLE LIBRE 3 READER) DEVI Use as directed. 1 each 0   Continuous Glucose Sensor (FREESTYLE LIBRE 3 SENSOR) MISC Place 1 sensor on the skin every 14 days. Use to check glucose continuously 6 each 3   diltiazem  (CARDIZEM  CD) 240 MG 24 hr capsule TAKE 1 CAPSULE BY MOUTH EVERY DAY 90 capsule 1   dofetilide  (TIKOSYN ) 500 MCG capsule Take 1 capsule (500 mcg total) by mouth 2 (two) times daily. 180 capsule 2   DULoxetine  (CYMBALTA ) 60 MG capsule 1 po every day --- start after you finish 30 mg 90 capsule 3   estradiol  (ESTRACE ) 1 MG tablet TAKE 1 TABLET DAILY 90 tablet 1   ezetimibe  (ZETIA ) 10 MG tablet TAKE 1 TABLET BY MOUTH EVERY DAY 90 tablet 1   fenofibrate   160 MG tablet TAKE 1 TABLET DAILY 90 tablet 1   insulin  aspart (NOVOLOG  FLEXPEN) 100 UNIT/ML FlexPen Inject 12-15 Units into the skin 3 (three) times daily with meals. INJECT 12 TO 15 UNITS SUBCUTANEOUSLY 3 TIMES A   DAY WITH MEALS 45 mL 3   Insulin  Glargine (BASAGLAR  KWIKPEN) 100 UNIT/ML Inject 30 Units into the skin daily.     magnesium  oxide (MAG-OX) 400 MG tablet Take 1 tablet (400 mg total) by mouth 2 (two) times daily. 60 tablet 6   metFORMIN  (GLUCOPHAGE ) 850 MG tablet Take 1 tablet in the AM, and 2 tablets in the PM.      omeprazole  (PRILOSEC) 20 MG capsule Take 1 capsule (20 mg total) by mouth daily. 30 capsule 3   OZEMPIC , 2 MG/DOSE, 8 MG/3ML SOPN INJECT 2MG  SUBCUTANEOUSLY  ONCE WEEKLY (EVERY 7 DAYS) AS DIRECTED 9 mL 3   tobramycin (TOBREX) 0.3 % ophthalmic solution Place 1 drop into both eyes 4 (four) times daily. Use eye solution for 3 days after eye injections     Current Facility-Administered Medications  Medication Dose Route Frequency Provider Last Rate Last Admin   ipratropium-albuterol  (DUONEB) 0.5-2.5 (3) MG/3ML nebulizer solution 3 mL  3 mL Nebulization Q6H Copland, Harlene BROCKS, MD   3 mL at 11/15/16 1330    Physical Exam: BP 136/80   Pulse 80   Ht 5' 3 (1.6 m)   Wt 125.9 kg   BMI 49.17 kg/m   GEN: Well nourished, well developed in no acute distress CARDIAC: Regular rate and rhythm, no murmurs, rubs, gallops RESPIRATORY:  Clear to auscultation without rales, wheezing or rhonchi  ABDOMEN: Soft, non-tender, non-distended EXTREMITIES:  No edema; No deformity    Wt Readings from Last 3 Encounters:  05/02/24 125.9 kg  04/02/24 131 kg  04/02/24 130.6 kg     EKG today demonstrates  SR, PAC Vent. rate 80 BPM PR interval 170 ms QRS duration 78 ms QT/QTcB 398/459 ms   Echo 01/09/24 demonstrated   1. Left ventricular ejection fraction, by estimation, is 55 to 60%. The  left ventricle has normal function. The left ventricle has no regional  wall motion abnormalities. There is mild concentric left ventricular  hypertrophy. Left ventricular diastolic parameters were normal.   2. Right ventricular systolic function is normal. The right ventricular  size is normal. Mildly increased right ventricular wall thickness.  Tricuspid regurgitation signal is inadequate for assessing PA pressure.   3. Left atrial size was mildly dilated.   4. A small pericardial effusion is present. The pericardial effusion is  posterior to the left ventricle. There is no evidence of cardiac  tamponade.   5. The  mitral valve is normal in structure. No evidence of mitral valve  regurgitation.   6. The aortic valve is tricuspid. Aortic valve regurgitation is not  visualized.   7. The inferior vena cava is normal in size with <50% respiratory  variability, suggesting right atrial pressure of 8 mmHg.    CHA2DS2-VASc Score = 4  The patient's score is based upon: CHF History: 0 HTN History: 1 Diabetes History: 1 Stroke History: 0 Vascular Disease History: 0 Age Score: 1 Gender Score: 1       ASSESSMENT AND PLAN: Paroxysmal Atrial Fibrillation/atrial flutter The patient's CHA2DS2-VASc score is 4, indicating a 4.8% annual risk of stroke.   S/p dofetilide  admission 03/2024 Patient appears to be maintaining SR Continue dofetilide  500 mcg BID Continue diltiazem  240 mg daily Continue Eliquis  5 mg BID  Secondary Hypercoagulable State (ICD10:  D68.69) The patient is at significant risk for stroke/thromboembolism based upon her CHA2DS2-VASc Score of 4.  Continue Apixaban  (Eliquis ). No bleeding issues.   High Risk Medication Monitoring (ICD 10: Z79.899) QT interval on ECG acceptable for dofetilide  monitoring. Check bmet/mag today.     OSA  Mild on study 03/13/24  HTN Stable on current regimen  Obesity Body mass index is 49.17 kg/m.  Encouraged lifestyle modification   Follow up in the AF clinic in 3 months.       Daril Kicks PA-C Af[facility] [address] [place], [place] [address] [phone]

## 2024-05-03 ENCOUNTER — Ambulatory Visit (HOSPITAL_COMMUNITY): Payer: Self-pay | Admitting: Physician Assistant

## 2024-05-03 ENCOUNTER — Other Ambulatory Visit: Payer: Self-pay | Admitting: Family Medicine

## 2024-05-03 DIAGNOSIS — E785 Hyperlipidemia, unspecified: Secondary | ICD-10-CM

## 2024-05-03 DIAGNOSIS — I4819 Other persistent atrial fibrillation: Secondary | ICD-10-CM

## 2024-05-03 LAB — BASIC METABOLIC PANEL WITH GFR
BUN/Creatinine Ratio: 27 (ref 12–28)
BUN: 29 mg/dL — ABNORMAL HIGH (ref 8–27)
CO2: 19 mmol/L — ABNORMAL LOW (ref 20–29)
Calcium: 9.8 mg/dL (ref 8.7–10.3)
Chloride: 101 mmol/L (ref 96–106)
Creatinine, Ser: 1.07 mg/dL — ABNORMAL HIGH (ref 0.57–1.00)
Glucose: 115 mg/dL — ABNORMAL HIGH (ref 70–99)
Potassium: 4.6 mmol/L (ref 3.5–5.2)
Sodium: 141 mmol/L (ref 134–144)
eGFR: 55 mL/min/{1.73_m2} — ABNORMAL LOW (ref >59)

## 2024-05-03 LAB — MAGNESIUM: Magnesium: 1.7 mg/dL (ref 1.6–2.3)

## 2024-05-03 MED ORDER — MAGNESIUM OXIDE 400 MG PO TABS: 400.0000 mg | ORAL_TABLET | Freq: Three times a day (TID) | ORAL | 3 refills | Status: DC

## 2024-05-07 ENCOUNTER — Other Ambulatory Visit (HOSPITAL_COMMUNITY): Payer: Self-pay

## 2024-05-10 ENCOUNTER — Encounter: Payer: Self-pay | Admitting: Podiatry

## 2024-05-10 ENCOUNTER — Ambulatory Visit (INDEPENDENT_AMBULATORY_CARE_PROVIDER_SITE_OTHER): Admitting: Podiatry

## 2024-05-10 DIAGNOSIS — M79676 Pain in unspecified toe(s): Secondary | ICD-10-CM

## 2024-05-10 DIAGNOSIS — E1151 Type 2 diabetes mellitus with diabetic peripheral angiopathy without gangrene: Secondary | ICD-10-CM | POA: Diagnosis not present

## 2024-05-10 DIAGNOSIS — Z794 Long term (current) use of insulin: Secondary | ICD-10-CM

## 2024-05-10 DIAGNOSIS — B351 Tinea unguium: Secondary | ICD-10-CM

## 2024-05-10 NOTE — Progress Notes (Signed)
This patient returns to my office for at risk foot care.  This patient requires this care by a professional since this patient will be at risk due to having diabetes and history of thrombophlebitis.  This patient says her callus on her big toes is painful walking and wearing her shoes.  She is unable to self treat.  This patient presents for at risk foot care today.  General Appearance  Alert, conversant and in no acute stress.  Vascular  Dorsalis pedis and posterior tibial  pulses are not  palpable  Bilaterally  Due to swelling..  Capillary return is within normal limits  bilaterally. Temperature is within normal limits  bilaterally.  Neurologic  Senn-Weinstein monofilament wire test diminished  bilaterally. Muscle power within normal limits bilaterally.  Nails Thick disfigured discolored nails with subungual debris  from hallux to fifth toes bilaterally. No evidence of bacterial infection or drainage bilaterally.  Orthopedic  No limitations of motion  feet .  No crepitus or effusions noted.  No bony pathology or digital deformities noted.  Skin  normotropic skin with no porokeratosis noted bilaterally.  No signs of infections or ulcers noted.   Pinch callus  B/L.   No infection or drainage noted.  Onychomycosis  Pain in right toes  Pain in left toes  Pinch callus  B/l.  Consent was obtained for treatment procedures.   Mechanical debridement of nails 1-5  bilaterally performed with a nail nipper.  Filed with dremel without incident.  Debride callus with dremel tool.   Return office visit   9  weeks                  Told patient to return for periodic foot care and evaluation due to potential at risk complications.   Helane Gunther DPM

## 2024-05-14 ENCOUNTER — Other Ambulatory Visit (HOSPITAL_COMMUNITY): Payer: Self-pay | Admitting: *Deleted

## 2024-05-14 MED ORDER — MAGNESIUM OXIDE 400 MG PO TABS: 400.0000 mg | ORAL_TABLET | Freq: Two times a day (BID) | ORAL | 3 refills | Status: DC

## 2024-05-18 ENCOUNTER — Other Ambulatory Visit (HOSPITAL_COMMUNITY): Payer: Self-pay

## 2024-05-19 ENCOUNTER — Other Ambulatory Visit (HOSPITAL_COMMUNITY): Payer: Self-pay

## 2024-05-21 ENCOUNTER — Other Ambulatory Visit (HOSPITAL_COMMUNITY): Payer: Self-pay

## 2024-05-22 ENCOUNTER — Other Ambulatory Visit (HOSPITAL_COMMUNITY): Payer: Self-pay | Admitting: *Deleted

## 2024-05-22 ENCOUNTER — Other Ambulatory Visit (HOSPITAL_COMMUNITY): Payer: Self-pay

## 2024-05-22 ENCOUNTER — Ambulatory Visit: Admitting: Internal Medicine

## 2024-05-22 NOTE — Progress Notes (Deleted)
 Patient ID: Andrea Marks, female   DOB: [birth year], 73 y.o.   MRN: [id number]  HPI: Andrea Marks is a 73 y.o.-year-old female, returning for f/u for DM2, dx 1994, insulin -dependent, uncontrolled, with complications (diabetic retinopathy). Last visit was 5 months ago.  Interim history: No increased urination, nausea, chest pain.  She has B knee pain (has 2 fractures in R knee -  had a nerve ablation).  She has steroid injections every 3 months.   No more falls since last visit.  Reviewed HbA1c levels: Lab Results  Component Value Date   HGBA1C 7.8 (H) 01/09/2024   HGBA1C 8.3 (H) 10/18/2023   HGBA1C 6.6 (A) 08/04/2023   She is on: - Metformin  850 mg in am and  1700 mg at night - Ozempic  1 >> 2 mg weekly - Basaglar  30 units at bedtime - NovoLog  15 min before meals: 10-15 units before b'fast and lunch 10-15 units before dinner  (if you inject after the meals, take only 50% of the dose) Around the time of steroids, take a higher NovoLog  dose (maybe up to 20 units). Tried Bydureon  once a week >> she had problems injecting the medication.  She also tried Victoza . She was on Humalog  in the past. She was on Amaryl  in the past, which we stopped going to increase her insulin  doses 05/2018. She was on a Vgo pump, which we stopped in 2019 as she needed higher doses of insulin .  She checks her sugars more than 4 times a day with her freestyle libre 2 CGM (prefers to check with receiver):  Previously:  Previously:  Lowest blood sugars: 58 >> 41 >> 60s. Highest blood sugars: 300s >> 200s >> 300s.  Meals: - Breakfast: egg + bagel + yoghurt - Lunch: may skip, salad - Dinner: salad, hamburger/hot dog, tuna fish - Snacks: potato chips, pretzel No sodas, only drinks sparkling water.  No CKD, last BUN/creatinine was:  Lab Results  Component Value Date   BUN 29 (H) 05/02/2024   CREATININE 1.07 (H) 05/02/2024   Lab Results  Component Value Date   MICRALBCREAT  5.4 07/12/2014   MICRALBCREAT 15.3 06/09/2012   MICRALBCREAT 1.4 11/23/2010   MICRALBCREAT 6.5 12/20/2008  On benazepril .  + HL; Last set of lipids: Lab Results  Component Value Date   CHOL 130 10/18/2023   HDL 58.10 10/18/2023   LDLCALC 48 10/18/2023   LDLDIRECT 64.0 04/21/2017   TRIG 120.0 10/18/2023   CHOLHDL 2 10/18/2023  Prev. On Vytorin  >> Lipitor 20 + Zetia  10 mg daily and fenofibrate  160.  - Last eye exam: 11/01/2023: + DR. She has a history of cataract surgery.  She was admitted with blurry vision, papilledema in 02/26/2021.  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 She saw a retina specialist and a neurologist.  She was on steroids. She started IO injections.  - + numbness and tingling in her feet.  She sees podiatry.  Last foot exam 10/18/2023.  She had an ulcer in lower right leg (has lymphedema) >> healed. She also has a history of HTN, GERD, depression, h/o thrombophlebitis. Previously on B12 injections now on 1000 mcg p.o. B12 daily. In 03/2022, she lost consciousness after a vomiting episode.  She was down, on the floor, near her bed, the entire night, as she could not get up.  Since then, she ordered another alert bracelet and also an alert button for the shower.  She was not sure if her sugars were low at that time  but the investigation was negative for culprits when paramedics came. She fell 01/08/2024 in her kitchen - sBP 64 as checked by ambulance.  In the emergency room, she was found to be in the A-fib.  She was admitted.  She was started on Cardizem  and her statin was changed.  She was found to have sleep apnea in 2024.  ROS: + see HPI  I reviewed pt's medications, allergies, PMH, social hx, family hx, and changes were documented in the history of present illness. Otherwise, unchanged from my initial visit note.  Past Medical History:  Diagnosis Date   Allergy    Anxiety    Arthritis    Cataract    Depression    Diabetes  mellitus    GERD (gastroesophageal reflux disease)    Hyperlipidemia    Hypertension    Obesity    Past Surgical History:  Procedure Laterality Date   ABDOMINAL HYSTERECTOMY  11/01/1989   BSO   EYE SURGERY     IR ABLATE LIVER CRYOABLATION  12/31/2021   IR RADIOLOGIST EVAL & MGMT  12/11/2021   IR RADIOLOGIST EVAL & MGMT  01/07/2022   IR RADIOLOGIST EVAL & MGMT  02/15/2022   Social History   Socioeconomic History   Marital status: Widowed    Spouse name: Not on file   Number of children: 0   Years of education: 37   Highest education level: 12th grade  Occupational History   Occupation: retired from IT consultant: RETIRED  Tobacco Use   Smoking status: Former    Current packs/day: 0.00    Types: Cigarettes    Quit date: 1995    Years since quitting: 30.5    Passive exposure: Past   Smokeless tobacco: Never   Tobacco comments:    Former smoker 02/09/24  Vaping Use   Vaping status: Never Used  Substance and Sexual Activity   Alcohol use: No   Drug use: No   Sexual activity: Not Currently    Partners: Male  Other Topics Concern   Not on file  Social History Narrative   Lives with husband   Right Handed   Drinks 3-5 cups caffeine daily   Social Drivers of Health   Financial Resource Strain: Low Risk  (10/17/2023)   Overall Financial Resource Strain (CARDIA)    Difficulty of Paying Living Expenses: Not hard at all  Food Insecurity: No Food Insecurity (03/23/2024)   Hunger Vital Sign    Worried About Running Out of Food in the Last Year: Never true    Ran Out of Food in the Last Year: Never true  Transportation Needs: No Transportation Needs (03/23/2024)   PRAPARE - Administrator, Civil Service (Medical): No    Lack of Transportation (Non-Medical): No  Physical Activity: Inactive (10/17/2023)   Exercise Vital Sign    Days of Exercise per Week: 0 days    Minutes of Exercise per Session: 0 min  Stress: Stress Concern Present (10/17/2023)    [employer] of Occupational Health - Occupational Stress Questionnaire    Feeling of Stress : To some extent  Social Connections: Moderately Isolated (03/19/2024)   Social Connection and Isolation Panel    Frequency of Communication with Friends and Family: Once a week    Frequency of Social Gatherings with Friends and Family: Once a week    Attends Religious Services: 1 to 4 times per year    Active Member of [employer] or Organizations: Yes  Attends Banker Meetings: 1 to 4 times per year    Marital Status: Widowed  Intimate Partner Violence: Not At Risk (03/23/2024)   Humiliation, Afraid, Rape, and Kick questionnaire    Fear of Current or Ex-Partner: No    Emotionally Abused: No    Physically Abused: No    Sexually Abused: No   Current Outpatient Medications on File Prior to Visit  Medication Sig Dispense Refill   acetaminophen  (TYLENOL ) 650 MG CR tablet Take 1,300 mg by mouth 2 (two) times daily as needed for pain. For pain     apixaban  (ELIQUIS ) 5 MG TABS tablet Take 1 tablet (5 mg total) by mouth 2 (two) times daily. 180 tablet 2   atorvastatin  (LIPITOR) 20 MG tablet TAKE 1 TABLET BY MOUTH EVERYDAY AT BEDTIME 90 tablet 1   benazepril  (LOTENSIN ) 20 MG tablet Take 1 tablet (20 mg total) by mouth daily.     Continuous Glucose Receiver (FREESTYLE LIBRE 3 READER) DEVI Use as directed. 1 each 0   Continuous Glucose Sensor (FREESTYLE LIBRE 3 SENSOR) MISC Place 1 sensor on the skin every 14 days. Use to check glucose continuously 6 each 3   diltiazem  (CARDIZEM  CD) 240 MG 24 hr capsule TAKE 1 CAPSULE BY MOUTH EVERY DAY 90 capsule 1   dofetilide  (TIKOSYN ) 500 MCG capsule Take 1 capsule (500 mcg total) by mouth 2 (two) times daily. 180 capsule 2   DULoxetine  (CYMBALTA ) 60 MG capsule 1 po every day --- start after you finish 30 mg 90 capsule 3   estradiol  (ESTRACE ) 1 MG tablet TAKE 1 TABLET DAILY 90 tablet 1   ezetimibe  (ZETIA ) 10 MG tablet TAKE 1 TABLET BY MOUTH EVERY DAY 90  tablet 1   fenofibrate  160 MG tablet Take 1 tablet (160 mg total) by mouth daily. 90 tablet 0   insulin  aspart (NOVOLOG  FLEXPEN) 100 UNIT/ML FlexPen Inject 12-15 Units into the skin 3 (three) times daily with meals. INJECT 12 TO 15 UNITS SUBCUTANEOUSLY 3 TIMES A   DAY WITH MEALS 45 mL 3   Insulin  Glargine (BASAGLAR  KWIKPEN) 100 UNIT/ML Inject 30 Units into the skin daily.     magnesium  oxide (MAG-OX) 400 MG tablet Take 1 tablet (400 mg total) by mouth 2 (two) times daily. 60 tablet 3   metFORMIN  (GLUCOPHAGE ) 850 MG tablet Take 1 tablet in the AM, and 2 tablets in the PM.     OZEMPIC , 2 MG/DOSE, 8 MG/3ML SOPN INJECT 2MG  SUBCUTANEOUSLY  ONCE WEEKLY (EVERY 7 DAYS) AS DIRECTED 9 mL 3   tobramycin (TOBREX) 0.3 % ophthalmic solution Place 1 drop into both eyes 4 (four) times daily. Use eye solution for 3 days after eye injections     Current Facility-Administered Medications on File Prior to Visit  Medication Dose Route Frequency Provider Last Rate Last Admin   ipratropium-albuterol  (DUONEB) 0.5-2.5 (3) MG/3ML nebulizer solution 3 mL  3 mL Nebulization Q6H Copland, Jessica C, MD   3 mL at 11/15/16 1330   Allergies  Allergen Reactions   Hydrocodone-Acetaminophen  Other (See Comments)    unknown   Oxycodone Hcl Other (See Comments)    unknown   Penicillins Other (See Comments)    unknown   Prednisone      Patient has noted allergy to prednisone  but she says it was a questionable history of mild nausea with prednisone  and nothing more    Family History  Problem Relation Age of Onset   Macular degeneration Mother    Heart disease Mother  syncope   Aortic stenosis Mother    Lung disease Father 30       mesothelioma   Cancer Maternal Aunt        stomach   Heart disease Paternal Uncle        cabg   Diabetes Paternal Uncle    Heart disease Paternal Uncle    Diabetes Paternal Uncle    Diabetes Paternal Uncle    Heart disease Paternal Uncle    Heart disease Paternal Uncle    Heart  disease Paternal Uncle    Lung cancer Other        asbestos   Diabetes Paternal Grandmother    Cancer Other        lung   PE: There were no vitals taken for this visit. Wt Readings from Last 20 Encounters:  05/02/24 277 lb 9.6 oz (125.9 kg)  04/02/24 288 lb 12.8 oz (131 kg)  04/02/24 288 lb (130.6 kg)  03/23/24 290 lb (131.5 kg)  03/19/24 290 lb (131.5 kg)  03/19/24 291 lb 6.4 oz (132.2 kg)  03/08/24 283 lb (128.4 kg)  02/20/24 281 lb (127.5 kg)  02/16/24 277 lb 12.8 oz (126 kg)  02/10/24 284 lb 12.8 oz (129.2 kg)  02/09/24 281 lb (127.5 kg)  02/09/24 281 lb (127.5 kg)  01/25/24 273 lb 3.2 oz (123.9 kg)  01/20/24 276 lb 9.6 oz (125.5 kg)  01/08/24 278 lb (126.1 kg)  12/23/23 270 lb (122.5 kg)  10/18/23 270 lb 9.6 oz (122.7 kg)  09/22/23 269 lb 9.6 oz (122.3 kg)  08/04/23 269 lb 9.6 oz (122.3 kg)  12/22/22 258 lb (117 kg)    Constitutional: overweight, in NAD Eyes: EOMI, no exophthalmos ENT: no thyromegaly, no cervical lymphadenopathy Cardiovascular: Regular rate, irregularly irregular rhythm, No MRG, + B LE edema Respiratory: CTA B Musculoskeletal: no deformities Skin: no rashes except stasis dermatitis of bilateral lower legs Neurological: no tremor with outstretched hands, but chin tremor  ASSESSMENT: 1. DM2, insulin -dependent, uncontrolled, with complications - DR  2. Obesity class 3  3. HL  PLAN:  1. Patient with history of uncontrolled type 2 diabetes, insulin -dependent, on metformin , weekly GLP-1 receptor agonist and basal/bolus insulin , with the dose adjusted at last visit.  HbA1c was 7.8% before the visit.  Sugars were higher, fluctuating usually higher than target, with improvement in blood sugars after lunch but significant increases after dinner.  She was taking a lower dose of basal insulin  than recommended, as she forgot the correct dose and only realized it 2 days prior to the appointment.  Also, advised her to take a higher dose of NovoLog  before  dinner. CGM interpretation: -At today's visit, we reviewed her CGM downloads: It appears that *** of values are in target range (goal >70%), while *** are higher than 180 (goal <25%), and *** are lower than 70 (goal <4%).  The calculated average blood sugar is ***.  The projected HbA1c for the next 3 months (GMI) is ***. -Reviewing the CGM trends, ***  - I advised her to:  Patient Instructions  Please continue: - Metformin  850 mg in am and 1700 mg at night - Ozempic  2 mg weekly - Basaglar  30 units at bedtime - NovoLog  15 min before meals: 10-15 units before b'fast and lunch 10-15 units before dinner  (if you inject after the meals, take only 50% of the dose) Around the time of steroids, take a higher NovoLog  dose (maybe up to 20 units).  Please return in 3-4 months.  -  advised to check sugars at different times of the day - 4x a day, rotating check times - advised for yearly eye exams >> she is UTD - return to clinic in 3-4 months  2. Obesity class 3  - Will continue Ozempic  which should also help with weight loss.  She is at the maximum dose. - She gained 4 pounds before last visit - Since last visit, she gained 19 pounds and then lost 14.  She is net +4 pounds since last visit  3. HL - Latest lipid panel was reviewed from 10/2023: All fractions at goal: Lab Results  Component Value Date   CHOL 130 10/18/2023   HDL 58.10 10/18/2023   LDLCALC 48 10/18/2023   LDLDIRECT 64.0 04/21/2017   TRIG 120.0 10/18/2023   CHOLHDL 2 10/18/2023  -Previously on simvastatin -ezetimibe  40-10 mg daily, but currently on Lipitor 20 mg daily, Zetia  10 mg daily and fenofibrate  160 mg daily without side effects  Lela Fendt, MD PhD [facility] Endocrinology

## 2024-05-24 ENCOUNTER — Other Ambulatory Visit (HOSPITAL_COMMUNITY): Payer: Self-pay

## 2024-05-25 ENCOUNTER — Telehealth (HOSPITAL_COMMUNITY): Payer: Self-pay

## 2024-05-25 ENCOUNTER — Other Ambulatory Visit (HOSPITAL_COMMUNITY): Payer: Self-pay

## 2024-05-25 LAB — MAGNESIUM: Magnesium: 1.7 mg/dL (ref 1.6–2.3)

## 2024-05-29 ENCOUNTER — Telehealth (HOSPITAL_COMMUNITY): Payer: Self-pay

## 2024-05-29 ENCOUNTER — Other Ambulatory Visit (HOSPITAL_COMMUNITY): Payer: Self-pay

## 2024-05-29 NOTE — Telephone Encounter (Signed)
 PA request has been Received. New Encounter has been or will be created for follow up. For additional info see Pharmacy Prior Auth telephone encounter from 05/29/24.

## 2024-05-29 NOTE — Telephone Encounter (Signed)
 Pharmacy Patient Advocate Encounter   Received notification from Pt Calls Messages that prior authorization for FreeStyle Libre 3 Reader device  is required/requested.   Insurance verification completed.   The patient is insured through [facility] .   Per test claim: This medication is only covered underneath her Part B, [employer] are not allowed to bill part B for testing supplies so she will have to go to a different pharmacy in order for this to be covered.

## 2024-05-30 NOTE — Telephone Encounter (Signed)
 error

## 2024-06-04 ENCOUNTER — Encounter: Payer: Self-pay | Admitting: Internal Medicine

## 2024-06-04 ENCOUNTER — Ambulatory Visit (INDEPENDENT_AMBULATORY_CARE_PROVIDER_SITE_OTHER): Admitting: Internal Medicine

## 2024-06-04 VITALS — BP 138/80 | HR 64 | Ht 63.0 in | Wt 284.8 lb

## 2024-06-04 DIAGNOSIS — E785 Hyperlipidemia, unspecified: Secondary | ICD-10-CM

## 2024-06-04 DIAGNOSIS — Z794 Long term (current) use of insulin: Secondary | ICD-10-CM

## 2024-06-04 DIAGNOSIS — E11319 Type 2 diabetes mellitus with unspecified diabetic retinopathy without macular edema: Secondary | ICD-10-CM

## 2024-06-04 LAB — POCT GLYCOSYLATED HEMOGLOBIN (HGB A1C): Hemoglobin A1C: 7.5 % — AB (ref 4.0–5.6)

## 2024-06-04 NOTE — Progress Notes (Signed)
 Patient ID: Andrea Marks, female   DOB: June 20, 1951, 73 y.o.   MRN: [id number] This note was precharted 05/22/2024.  HPI: Andrea Marks is a 73 y.o.-year-old female, returning for f/u for DM2, dx 1994, insulin -dependent, uncontrolled, with complications (diabetic retinopathy). Last visit was 5 months ago.  Interim history: No increased urination, nausea, chest pain.  She has B knee pain (has 2 fractures in R knee -  had a nerve ablation).  She continues steroid injections every 3-4 months.   She unfortunately is not able to function without the injections but tries to stretch them every 3 to 4 months.  Sugars are much higher after these. She still has a large hematoma after her fall in 12/2023.  This is slowly decreasing in size but she has problems sleeping at night because of not finding a comfortable position.  Reviewed HbA1c levels: Lab Results  Component Value Date   HGBA1C 7.8 (H) 01/09/2024   HGBA1C 8.3 (H) 10/18/2023   HGBA1C 6.6 (A) 08/04/2023   She is on: - Metformin  850 mg in am and  1700 mg at night - Ozempic  1 >> 2 mg weekly - Basaglar  30 units at bedtime - NovoLog  15 min before meals: 10-15 units before b'fast and lunch 10-15 units before dinner  (if you inject after the meals, take only 50% of the dose) Around the time of steroids, take a higher NovoLog  dose (maybe up to 20 units). Tried Bydureon  once a week >> she had problems injecting the medication.  She also tried Victoza . She was on Humalog  in the past. She was on Amaryl  in the past, which we stopped going to increase her insulin  doses 05/2018. She was on a Vgo pump, which we stopped in 2019 as she needed higher doses of insulin .  She checks her sugars more than 4 times a day with her freestyle libre 2 CGM (prefers to check with receiver):  Previously:  Previously:  Lowest blood sugars: 58 >> 41 >> 60s. Highest blood sugars: 300s >> 200s >> 300s.  Meals: - Breakfast: egg + bagel +  yoghurt - Lunch: may skip, salad - Dinner: salad, hamburger/hot dog, tuna fish - Snacks: potato chips, pretzel No sodas, only drinks sparkling water.  No CKD, last BUN/creatinine was:  Lab Results  Component Value Date   BUN 29 (H) 05/02/2024   CREATININE 1.07 (H) 05/02/2024   08/04/2023: Microalbumin 23.7 Lab Results  Component Value Date   MICRALBCREAT 5.4 07/12/2014   MICRALBCREAT 15.3 06/09/2012   MICRALBCREAT 1.4 11/23/2010   MICRALBCREAT 6.5 12/20/2008  On benazepril .  + HL; Last set of lipids: Lab Results  Component Value Date   CHOL 130 10/18/2023   HDL 58.10 10/18/2023   LDLCALC 48 10/18/2023   LDLDIRECT 64.0 04/21/2017   TRIG 120.0 10/18/2023   CHOLHDL 2 10/18/2023  Prev. On Vytorin  >> Lipitor 20 + Zetia  10 mg daily and fenofibrate  160.  - Last eye exam: 11/01/2023: + DR. She has a history of cataract surgery.  She was admitted with blurry vision, papilledema in 02/26/2021.  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 She saw a retina specialist and a neurologist.  She was on steroids. She started IO injections.  - + numbness and tingling in her feet.  She sees podiatry.  Last foot exam 05/10/2024 by Dr. Loreda.  She had an ulcer in lower right leg (has lymphedema) >> healed. She also has a history of HTN, GERD, depression, h/o thrombophlebitis. Previously on B12 injections  now on 1000 mcg p.o. B12 daily. In 03/2022, she lost consciousness after a vomiting episode.  She was down, on the floor, near her bed, the entire night, as she could not get up.  Since then, she ordered another alert bracelet and also an alert button for the shower.  She was not sure if her sugars were low at that time but the investigation was negative for culprits when paramedics came. She fell 01/08/2024 in her kitchen - sBP 64 as checked by ambulance.  In the emergency room, she was found to be in the A-fib.  She was admitted.  She was started on Cardizem  and her  statin was changed.  She was found to have sleep apnea in 2024.  ROS: + see HPI  I reviewed pt's medications, allergies, PMH, social hx, family hx, and changes were documented in the history of present illness. Otherwise, unchanged from my initial visit note.  Past Medical History:  Diagnosis Date   Allergy    Anxiety    Arthritis    Cataract    Depression    Diabetes mellitus    GERD (gastroesophageal reflux disease)    Hyperlipidemia    Hypertension    Obesity    Past Surgical History:  Procedure Laterality Date   ABDOMINAL HYSTERECTOMY  11/01/1989   BSO   EYE SURGERY     IR ABLATE LIVER CRYOABLATION  12/31/2021   IR RADIOLOGIST EVAL & MGMT  12/11/2021   IR RADIOLOGIST EVAL & MGMT  01/07/2022   IR RADIOLOGIST EVAL & MGMT  02/15/2022   Social History   Socioeconomic History   Marital status: Widowed    Spouse name: Not on file   Number of children: 0   Years of education: 48   Highest education level: 12th grade  Occupational History   Occupation: retired from IT consultant: RETIRED  Tobacco Use   Smoking status: Former    Current packs/day: 0.00    Types: Cigarettes    Quit date: 1995    Years since quitting: 30.6    Passive exposure: Past   Smokeless tobacco: Never   Tobacco comments:    Former smoker 02/09/24  Vaping Use   Vaping status: Never Used  Substance and Sexual Activity   Alcohol use: No   Drug use: No   Sexual activity: Not Currently    Partners: Male  Other Topics Concern   Not on file  Social History Narrative   Lives with husband   Right Handed   Drinks 3-5 cups caffeine daily   Social Drivers of Health   Financial Resource Strain: Low Risk  (10/17/2023)   Overall Financial Resource Strain (CARDIA)    Difficulty of Paying Living Expenses: Not hard at all  Food Insecurity: No Food Insecurity (03/23/2024)   Hunger Vital Sign    Worried About Running Out of Food in the Last Year: Never true    Ran Out of Food in the Last  Year: Never true  Transportation Needs: No Transportation Needs (03/23/2024)   PRAPARE - Administrator, Civil Service (Medical): No    Lack of Transportation (Non-Medical): No  Physical Activity: Inactive (10/17/2023)   Exercise Vital Sign    Days of Exercise per Week: 0 days    Minutes of Exercise per Session: 0 min  Stress: Stress Concern Present (10/17/2023)   [employer] of Occupational Health - Occupational Stress Questionnaire    Feeling of Stress : To some  extent  Social Connections: Moderately Isolated (03/19/2024)   Social Connection and Isolation Panel    Frequency of Communication with Friends and Family: Once a week    Frequency of Social Gatherings with Friends and Family: Once a week    Attends Religious Services: 1 to 4 times per year    Active Member of [employer] or Organizations: Yes    Attends Banker Meetings: 1 to 4 times per year    Marital Status: Widowed  Intimate Partner Violence: Not At Risk (03/23/2024)   Humiliation, Afraid, Rape, and Kick questionnaire    Fear of Current or Ex-Partner: No    Emotionally Abused: No    Physically Abused: No    Sexually Abused: No   Current Outpatient Medications on File Prior to Visit  Medication Sig Dispense Refill   acetaminophen  (TYLENOL ) 650 MG CR tablet Take 1,300 mg by mouth 2 (two) times daily as needed for pain. For pain     apixaban  (ELIQUIS ) 5 MG TABS tablet Take 1 tablet (5 mg total) by mouth 2 (two) times daily. 180 tablet 2   atorvastatin  (LIPITOR) 20 MG tablet TAKE 1 TABLET BY MOUTH EVERYDAY AT BEDTIME 90 tablet 1   benazepril  (LOTENSIN ) 20 MG tablet Take 1 tablet (20 mg total) by mouth daily.     Continuous Glucose Receiver (FREESTYLE LIBRE 3 READER) DEVI Use as directed. 1 each 0   Continuous Glucose Sensor (FREESTYLE LIBRE 3 SENSOR) MISC Place 1 sensor on the skin every 14 days. Use to check glucose continuously 6 each 3   diltiazem  (CARDIZEM  CD) 240 MG 24 hr capsule TAKE 1 CAPSULE  BY MOUTH EVERY DAY 90 capsule 1   dofetilide  (TIKOSYN ) 500 MCG capsule Take 1 capsule (500 mcg total) by mouth 2 (two) times daily. 180 capsule 2   DULoxetine  (CYMBALTA ) 60 MG capsule 1 po every day --- start after you finish 30 mg 90 capsule 3   estradiol  (ESTRACE ) 1 MG tablet TAKE 1 TABLET DAILY 90 tablet 1   ezetimibe  (ZETIA ) 10 MG tablet TAKE 1 TABLET BY MOUTH EVERY DAY 90 tablet 1   fenofibrate  160 MG tablet Take 1 tablet (160 mg total) by mouth daily. 90 tablet 0   insulin  aspart (NOVOLOG  FLEXPEN) 100 UNIT/ML FlexPen Inject 12-15 Units into the skin 3 (three) times daily with meals. INJECT 12 TO 15 UNITS SUBCUTANEOUSLY 3 TIMES A   DAY WITH MEALS 45 mL 3   Insulin  Glargine (BASAGLAR  KWIKPEN) 100 UNIT/ML Inject 30 Units into the skin daily.     magnesium  oxide (MAG-OX) 400 MG tablet Take 1 tablet (400 mg total) by mouth 2 (two) times daily. 60 tablet 3   metFORMIN  (GLUCOPHAGE ) 850 MG tablet Take 1 tablet in the AM, and 2 tablets in the PM.     OZEMPIC , 2 MG/DOSE, 8 MG/3ML SOPN INJECT 2MG  SUBCUTANEOUSLY  ONCE WEEKLY (EVERY 7 DAYS) AS DIRECTED 9 mL 3   tobramycin (TOBREX) 0.3 % ophthalmic solution Place 1 drop into both eyes 4 (four) times daily. Use eye solution for 3 days after eye injections     Current Facility-Administered Medications on File Prior to Visit  Medication Dose Route Frequency Provider Last Rate Last Admin   ipratropium-albuterol  (DUONEB) 0.5-2.5 (3) MG/3ML nebulizer solution 3 mL  3 mL Nebulization Q6H Copland, Jessica C, MD   3 mL at 11/15/16 1330   Allergies  Allergen Reactions   Hydrocodone-Acetaminophen  Other (See Comments)    unknown   Oxycodone Hcl Other (See  Comments)    unknown   Penicillins Other (See Comments)    unknown   Prednisone      Patient has noted allergy to prednisone  but she says it was a questionable history of mild nausea with prednisone  and nothing more    Family History  Problem Relation Age of Onset   Macular degeneration Mother    Heart  disease Mother        syncope   Aortic stenosis Mother    Lung disease Father 70       mesothelioma   Cancer Maternal Aunt        stomach   Heart disease Paternal Uncle        cabg   Diabetes Paternal Uncle    Heart disease Paternal Uncle    Diabetes Paternal Uncle    Diabetes Paternal Uncle    Heart disease Paternal Uncle    Heart disease Paternal Uncle    Heart disease Paternal Uncle    Lung cancer Other        asbestos   Diabetes Paternal Grandmother    Cancer Other        lung   PE: BP 138/80   Pulse 64   Ht 5' 3 (1.6 m)   Wt 284 lb 12.8 oz (129.2 kg)   SpO2 97%   BMI 50.45 kg/m  Wt Readings from Last 20 Encounters:  06/04/24 284 lb 12.8 oz (129.2 kg)  05/02/24 277 lb 9.6 oz (125.9 kg)  04/02/24 288 lb 12.8 oz (131 kg)  04/02/24 288 lb (130.6 kg)  03/23/24 290 lb (131.5 kg)  03/19/24 290 lb (131.5 kg)  03/19/24 291 lb 6.4 oz (132.2 kg)  03/08/24 283 lb (128.4 kg)  02/20/24 281 lb (127.5 kg)  02/16/24 277 lb 12.8 oz (126 kg)  02/10/24 284 lb 12.8 oz (129.2 kg)  02/09/24 281 lb (127.5 kg)  02/09/24 281 lb (127.5 kg)  01/25/24 273 lb 3.2 oz (123.9 kg)  01/20/24 276 lb 9.6 oz (125.5 kg)  01/08/24 278 lb (126.1 kg)  12/23/23 270 lb (122.5 kg)  10/18/23 270 lb 9.6 oz (122.7 kg)  09/22/23 269 lb 9.6 oz (122.3 kg)  08/04/23 269 lb 9.6 oz (122.3 kg)   Constitutional: overweight, in NAD Eyes: EOMI, no exophthalmos ENT: no thyromegaly, no cervical lymphadenopathy Cardiovascular: Regular rate, irregularly irregular rhythm, No MRG, + B LE edema Respiratory: CTA B Musculoskeletal: no deformities, + large convex deformity on the left side of her back Skin: no rashes except stasis dermatitis of bilateral lower legs Neurological: no tremor with outstretched hands, but chin tremor  ASSESSMENT: 1. DM2, insulin -dependent, uncontrolled, with complications - DR  2. Obesity class 3  3. HL  PLAN:  1. Patient with history of uncontrolled type 2 diabetes,  insulin -dependent, on metformin , weekly GLP-1 receptor agonist and basal/bolus insulin , with the dose adjusted at last visit.  HbA1c was 7.8% before the visit.  Sugars were higher, fluctuating usually higher than target, with improvement in blood sugars after lunch but significant increases after dinner.  She was taking a lower dose of basal insulin  than recommended, as she forgot the correct dose and only realized it 2 days prior to the appointment.  Also, advised her to take a higher dose of NovoLog  before dinner. CGM interpretation: -At today's visit, we reviewed her CGM downloads: It appears that 55% of values are in target range (goal >70%), while 45% are higher than 180 (goal <25%), and 0% are lower than 70 (goal <4%).  The calculated average blood sugar is 195.  The projected HbA1c for the next 3 months (GMI) is 8.0%. -Reviewing the CGM trends, sugars appear to be fluctuating within the target range but increasing significantly particularly after lunch and remaining almost 100% elevated after dinner.  Reviewing daily CGM tracings, she has extremely high blood sugars during steroid injections for approximately 3 days, but afterwards, sugars decreased significantly.  For now, we discussed about continuing the same regimen, but increasing the insulin  more during her steroid injections.  Unfortunately, she cannot avoid them.  Will also continue the rest of the regimen. - I advised her to:  Patient Instructions  Please continue: - Metformin  850 mg in am and 1700 mg at night - Ozempic  2 mg weekly - Basaglar  30 units at bedtime (with steroids: my need 40 units) - NovoLog  15 min before meals: 10-15 (units before b'fast and lunch 10-15 units before dinner  (if you inject after the meals, take only 50% of the dose) Around the time of steroids, take a higher NovoLog  dose (maybe up to 30 units).  Please return in 3-4 months.  -At today's visit, HbA1c is 7.6%, slightly lower than before. - advised to  check sugars at different times of the day - 4x a day, rotating check times - advised for yearly eye exams >> she is UTD - return to clinic in 3-4 months  2. Obesity class 3  - Will continue Ozempic  which should also help with weight loss.  She is at the maximum dose. - She gained 4 pounds before last visit - Since last visit, she gained 19 pounds and then lost 14.  She is net +11 pounds since last visit  3. HL - Latest lipid panel was reviewed from 10/2023 and fractions were all at goal: Lab Results  Component Value Date   CHOL 130 10/18/2023   HDL 58.10 10/18/2023   LDLCALC 48 10/18/2023   LDLDIRECT 64.0 04/21/2017   TRIG 120.0 10/18/2023   CHOLHDL 2 10/18/2023  - He was previously on simvastatin -ezetimibe  40-10 mg daily, but currently on Lipitor 20 mg daily, Zetia  10 mg daily and fenofibrate  160 mg daily without side effects.  Lela Fendt, MD PhD [facility] Endocrinology

## 2024-06-04 NOTE — Patient Instructions (Addendum)
 Please continue: - Metformin  850 mg in am and 1700 mg at night - Ozempic  2 mg weekly - Basaglar  30 units at bedtime (with steroids: my need 40 units) - NovoLog  15 min before meals: 10-15 (units before b'fast and lunch 10-15 units before dinner  (if you inject after the meals, take only 50% of the dose) Around the time of steroids, take a higher NovoLog  dose (maybe up to 30 units).  Please return in 3-4 months.

## 2024-06-06 ENCOUNTER — Other Ambulatory Visit: Payer: Self-pay | Admitting: Family Medicine

## 2024-06-06 DIAGNOSIS — F418 Other specified anxiety disorders: Secondary | ICD-10-CM

## 2024-06-06 LAB — MICROALBUMIN / CREATININE URINE RATIO
Creatinine, Urine: 109 mg/dL (ref 20–275)
Microalb Creat Ratio: 463 mg/g{creat} — ABNORMAL HIGH (ref <30)
Microalb, Ur: 50.5 mg/dL

## 2024-06-06 NOTE — Telephone Encounter (Signed)
Not on current med list, please review

## 2024-06-07 ENCOUNTER — Ambulatory Visit: Payer: Self-pay | Admitting: Internal Medicine

## 2024-06-07 ENCOUNTER — Other Ambulatory Visit: Payer: Self-pay | Admitting: Internal Medicine

## 2024-06-07 MED ORDER — EMPAGLIFLOZIN 10 MG PO TABS: 10.0000 mg | ORAL_TABLET | Freq: Every day | ORAL | 3 refills | Status: AC

## 2024-06-08 NOTE — Telephone Encounter (Signed)
Can I get an update on this please? Thanks

## 2024-06-15 ENCOUNTER — Encounter: Payer: Self-pay | Admitting: Family Medicine

## 2024-06-15 ENCOUNTER — Telehealth: Payer: Self-pay

## 2024-06-15 NOTE — Telephone Encounter (Signed)
 Copied from CRM #[id number]. Topic: Clinical - Medication Question >> Jun 15, 2024 12:47 PM [place] E wrote: Reason for CRM: Richard, Pharmacologist with [facility], called in regarding duplicate therapy of patient's FLUoxetine  (PROZAC ) 20 MG tablet  and DULoxetine  (CYMBALTA ) 60 MG capsule. Callback number for Charlie is [phone] option 2, and ref # [id number].

## 2024-06-18 NOTE — Telephone Encounter (Signed)
 Please see telephone call from 8/15. Please advise

## 2024-06-19 ENCOUNTER — Ambulatory Visit: Attending: Cardiology | Admitting: Cardiology

## 2024-06-19 ENCOUNTER — Encounter: Payer: Self-pay | Admitting: Cardiology

## 2024-06-19 VITALS — BP 158/82 | HR 82 | Ht 63.0 in | Wt 277.0 lb

## 2024-06-19 DIAGNOSIS — I480 Paroxysmal atrial fibrillation: Secondary | ICD-10-CM

## 2024-06-19 DIAGNOSIS — I4819 Other persistent atrial fibrillation: Secondary | ICD-10-CM

## 2024-06-19 DIAGNOSIS — I10 Essential (primary) hypertension: Secondary | ICD-10-CM

## 2024-06-19 DIAGNOSIS — E782 Mixed hyperlipidemia: Secondary | ICD-10-CM | POA: Diagnosis not present

## 2024-06-19 DIAGNOSIS — D6869 Other thrombophilia: Secondary | ICD-10-CM

## 2024-06-19 NOTE — Patient Instructions (Signed)
 Medication Instructions:  Your physician recommends that you continue on your current medications as directed. Please refer to the Current Medication list given to you today.  *If you need a refill on your cardiac medications before your next appointment, please call your pharmacy*  Follow-Up: At [facility], you and your health needs are our priority.  As part of our continuing mission to provide you with exceptional heart care, our providers are all part of one team.  This team includes your primary Cardiologist (physician) and Advanced Practice Providers or APPs (Physician Assistants and Nurse Practitioners) who all work together to provide you with the care you need, when you need it.  Your next appointment:   1 year(s)  Provider:   Kardie Tobb, DO

## 2024-06-19 NOTE — Progress Notes (Unsigned)
 Cardiology Office Note:    Date:  06/22/2024   ID:  Andrea Marks, DOB [birth year], MRN [id number]  PCP:  Antonio Meth, Jamee SAUNDERS, DO  Cardiologist:  Ali Mclaurin, DO  Electrophysiologist:  OLE ONEIDA HOLTS, MD   Referring MD: Antonio Meth, Jamee SAUNDERS, *    I am ok  History of Present Illness:    Andrea Marks is a 73 y.o. female with a hx of DM, HLD, HTN, atrial fibrillation ( on eliquis , Tikosyn  and cardizem ) and morbid obesity.   She has followed with the EP team and this is her first visit with me.   She share with me that in March, a fall led to the discovery of atrial fibrillation during hospitalization for low blood pressure. She developed a large bruise from her armpit to her waist, resulting in a persistent lump. Discomfort occurs when lying on her left side, leading her to sleep in a recliner. No pain is present, but discomfort is noted when lying on her right side.  She uses a kardiaMobile device to monitor her heart rhythm, noting normal rhythm for most of the past month and a half, with only two days of atrial fibrillation. Her last EKG in July showed normal rhythm.  She reports that she did not take her medication this morning prior to presenting for her appointment.  She has lived in [place]  for twenty years and currently lives alone without daily or weekly check-ins from others.  Past Medical History:  Diagnosis Date   Allergy    Anxiety    Arthritis    Cataract    Depression    Diabetes mellitus    GERD (gastroesophageal reflux disease)    Hyperlipidemia    Hypertension    Obesity     Past Surgical History:  Procedure Laterality Date   ABDOMINAL HYSTERECTOMY  11/01/1989   BSO   EYE SURGERY     IR ABLATE LIVER CRYOABLATION  12/31/2021   IR RADIOLOGIST EVAL & MGMT  12/11/2021   IR RADIOLOGIST EVAL & MGMT  01/07/2022   IR RADIOLOGIST EVAL & MGMT  02/15/2022    Current Medications: Current Meds  Medication Sig   acetaminophen   (TYLENOL ) 650 MG CR tablet Take 1,300 mg by mouth 2 (two) times daily as needed for pain. For pain   apixaban  (ELIQUIS ) 5 MG TABS tablet Take 1 tablet (5 mg total) by mouth 2 (two) times daily.   atorvastatin  (LIPITOR) 20 MG tablet TAKE 1 TABLET BY MOUTH EVERYDAY AT BEDTIME   benazepril  (LOTENSIN ) 20 MG tablet Take 1 tablet (20 mg total) by mouth daily.   Continuous Glucose Receiver (FREESTYLE LIBRE 3 READER) DEVI Use as directed. (Patient taking differently: Use as directed.)   Continuous Glucose Sensor (FREESTYLE LIBRE 3 SENSOR) MISC Place 1 sensor on the skin every 14 days. Use to check glucose continuously (Patient taking differently: Place 1 sensor on the skin every 14 days. Use to check glucose continuously)   diltiazem  (CARDIZEM  CD) 240 MG 24 hr capsule TAKE 1 CAPSULE BY MOUTH EVERY DAY   dofetilide  (TIKOSYN ) 500 MCG capsule Take 1 capsule (500 mcg total) by mouth 2 (two) times daily.   DULoxetine  (CYMBALTA ) 60 MG capsule 1 po every day --- start after you finish 30 mg   empagliflozin  (JARDIANCE ) 10 MG TABS tablet Take 1 tablet (10 mg total) by mouth daily before breakfast.   estradiol  (ESTRACE ) 1 MG tablet TAKE 1 TABLET DAILY   ezetimibe  (ZETIA ) 10 MG tablet  TAKE 1 TABLET BY MOUTH EVERY DAY   fenofibrate  160 MG tablet Take 1 tablet (160 mg total) by mouth daily.   FLUoxetine  (PROZAC ) 20 MG tablet TAKE 3 TABLETS DAILY   insulin  aspart (NOVOLOG  FLEXPEN) 100 UNIT/ML FlexPen Inject 12-15 Units into the skin 3 (three) times daily with meals. INJECT 12 TO 15 UNITS SUBCUTANEOUSLY 3 TIMES A   DAY WITH MEALS   Insulin  Glargine (BASAGLAR  KWIKPEN) 100 UNIT/ML Inject 30 Units into the skin daily.   magnesium  oxide (MAG-OX) 400 MG tablet Take 1 tablet (400 mg total) by mouth 2 (two) times daily.   metFORMIN  (GLUCOPHAGE ) 850 MG tablet Take 1 tablet in the AM, and 2 tablets in the PM.   OZEMPIC , 2 MG/DOSE, 8 MG/3ML SOPN INJECT 2MG  SUBCUTANEOUSLY  ONCE WEEKLY (EVERY 7 DAYS) AS DIRECTED   tobramycin  (TOBREX) 0.3 % ophthalmic solution Place 1 drop into both eyes 4 (four) times daily. Use eye solution for 3 days after eye injections   Current Facility-Administered Medications for the 06/19/24 encounter (Office Visit) with Jahrell Hamor, DO  Medication   ipratropium-albuterol  (DUONEB) 0.5-2.5 (3) MG/3ML nebulizer solution 3 mL     Allergies:   Hydrocodone-acetaminophen , Oxycodone hcl, Penicillins, and Prednisone    Social History   Socioeconomic History   Marital status: Widowed    Spouse name: Not on file   Number of children: 0   Years of education: 44   Highest education level: 12th grade  Occupational History   Occupation: retired from IT consultant: RETIRED  Tobacco Use   Smoking status: Former    Current packs/day: 0.00    Types: Cigarettes    Quit date: 1995    Years since quitting: 30.6    Passive exposure: Past   Smokeless tobacco: Never   Tobacco comments:    Former smoker 02/09/24  Vaping Use   Vaping status: Never Used  Substance and Sexual Activity   Alcohol use: No   Drug use: No   Sexual activity: Not Currently    Partners: Male  Other Topics Concern   Not on file  Social History Narrative   Lives with husband   Right Handed   Drinks 3-5 cups caffeine daily   Social Drivers of Health   Financial Resource Strain: Low Risk  (06/15/2024)   Overall Financial Resource Strain (CARDIA)    Difficulty of Paying Living Expenses: Not hard at all  Food Insecurity: No Food Insecurity (06/15/2024)   Hunger Vital Sign    Worried About Running Out of Food in the Last Year: Never true    Ran Out of Food in the Last Year: Never true  Transportation Needs: No Transportation Needs (06/15/2024)   PRAPARE - Administrator, Civil Service (Medical): No    Lack of Transportation (Non-Medical): No  Physical Activity: Inactive (06/15/2024)   Exercise Vital Sign    Days of Exercise per Week: 0 days    Minutes of Exercise per Session: Patient declined   Stress: Stress Concern Present (06/15/2024)   [employer] of Occupational Health - Occupational Stress Questionnaire    Feeling of Stress: To some extent  Social Connections: Socially Isolated (06/15/2024)   Social Connection and Isolation Panel    Frequency of Communication with Friends and Family: More than three times a week    Frequency of Social Gatherings with Friends and Family: Patient declined    Attends Religious Services: Never    Database administrator or Organizations: No  Attends Banker Meetings: Never    Marital Status: Widowed     Family History: The patient's family history includes Aortic stenosis in her mother; Cancer in her maternal aunt and another family member; Diabetes in her paternal grandmother, paternal uncle, paternal uncle, and paternal uncle; Heart disease in her mother, paternal uncle, paternal uncle, paternal uncle, paternal uncle, and paternal uncle; Lung cancer in an other family member; Lung disease (age of onset: 50) in her father; Macular degeneration in her mother.  ROS:   Review of Systems  Constitution: Negative for decreased appetite, fever and weight gain.  HENT: Negative for congestion, ear discharge, hoarse voice and sore throat.   Eyes: Negative for discharge, redness, vision loss in right eye and visual halos.  Cardiovascular: Negative for chest pain, dyspnea on exertion, leg swelling, orthopnea and palpitations.  Respiratory: Negative for cough, hemoptysis, shortness of breath and snoring.   Endocrine: Negative for heat intolerance and polyphagia.  Hematologic/Lymphatic: Negative for bleeding problem. Does not bruise/bleed easily.  Skin: Negative for flushing, nail changes, rash and suspicious lesions.  Musculoskeletal: Negative for arthritis, joint pain, muscle cramps, myalgias, neck pain and stiffness.  Gastrointestinal: Negative for abdominal pain, bowel incontinence, diarrhea and excessive appetite.  Genitourinary:  Negative for decreased libido, genital sores and incomplete emptying.  Neurological: Negative for brief paralysis, focal weakness, headaches and loss of balance.  Psychiatric/Behavioral: Negative for altered mental status, depression and suicidal ideas.  Allergic/Immunologic: Negative for HIV exposure and persistent infections.    EKGs/Labs/Other Studies Reviewed:    The following studies were reviewed today:   EKG:  The ekg ordered today demonstrates   Recent Labs: 01/09/2024: TSH 3.020 01/20/2024: Hemoglobin 11.9; Platelets 247.0 03/19/2024: ALT 9 05/02/2024: BUN 29; Creatinine, Ser 1.07; Potassium 4.6; Sodium 141 05/24/2024: Magnesium  1.7  Recent Lipid Panel    Component Value Date/Time   CHOL 130 10/18/2023 1447   TRIG 120.0 10/18/2023 1447   HDL 58.10 10/18/2023 1447   CHOLHDL 2 10/18/2023 1447   VLDL 24.0 10/18/2023 1447   LDLCALC 48 10/18/2023 1447   LDLDIRECT 64.0 04/21/2017 1639    Physical Exam:    VS:  BP (!) 158/82   Pulse 82   Ht 5' 3 (1.6 m)   Wt 277 lb (125.6 kg)   SpO2 96%   BMI 49.07 kg/m     Wt Readings from Last 3 Encounters:  06/20/24 277 lb (125.6 kg)  06/19/24 277 lb (125.6 kg)  06/04/24 284 lb 12.8 oz (129.2 kg)     GEN: Well nourished, well developed in no acute distress HEENT: Normal NECK: No JVD; No carotid bruits LYMPHATICS: No lymphadenopathy CARDIAC: S1S2 noted,RRR, no murmurs, rubs, gallops RESPIRATORY:  Clear to auscultation without rales, wheezing or rhonchi  ABDOMEN: Soft, non-tender, non-distended, +bowel sounds, no guarding. EXTREMITIES: No edema, No cyanosis, no clubbing MUSCULOSKELETAL:  No deformity  SKIN: Warm and dry NEUROLOGIC:  Alert and oriented x 3, non-focal PSYCHIATRIC:  Normal affect, good insight  ASSESSMENT:    1. Hypercoagulable state due to paroxysmal atrial fibrillation (HCC)   2. Atrial fibrillation, persistent (HCC)   3. Mixed hyperlipidemia   4. Essential hypertension   5. Morbid obesity (HCC)     PLAN:     Atrial fibrillation Atrial fibrillation well-controlled with medication, minimal episodes, normal heart function, regular rhythm. Monitored with kardiaMobile. - Continue current medication regimen for atrial fibrillation.  Hypertension - Blood pressure slightly elevated, likely due to missed medication. - Ensure to take blood pressure medication  as prescribed, including before appointments.  Type 2 diabetes mellitus Type 2 diabetes well-managed with Novolog  and Jardiance , A1c at 7.5. - Continue current diabetes medication regimen.  Hyperlipidemia Hyperlipidemia managed with Lipitor, appropriate for diabetes and cardiac protection. - Continue Lipitor for hyperlipidemia management.  Morbid obesity - lifestyle modification advised   The patient is in agreement with the above plan. The patient left the office in stable condition.  The patient will follow up in 1 year or sooner if needed   Medication Adjustments/Labs and Tests Ordered: Current medicines are reviewed at length with the patient today.  Concerns regarding medicines are outlined above.  No orders of the defined types were placed in this encounter.  No orders of the defined types were placed in this encounter.   Patient Instructions  Medication Instructions:  Your physician recommends that you continue on your current medications as directed. Please refer to the Current Medication list given to you today.  *If you need a refill on your cardiac medications before your next appointment, please call your pharmacy*  Follow-Up: At [facility], you and your health needs are our priority.  As part of our continuing mission to provide you with exceptional heart care, our providers are all part of one team.  This team includes your primary Cardiologist (physician) and Advanced Practice Providers or APPs (Physician Assistants and Nurse Practitioners) who all work together to provide you with the care you need,  when you need it.  Your next appointment:   1 year(s)  Provider:   Orrie Lascano, DO       Adopting a Healthy Lifestyle.  Know what a healthy weight is for you (roughly BMI <25) and aim to maintain this   Aim for 7+ servings of fruits and vegetables daily   65-80+ fluid ounces of water or unsweet tea for healthy kidneys   Limit to max 1 drink of alcohol per day; avoid smoking/tobacco   Limit animal fats in diet for cholesterol and heart health - choose grass fed whenever available   Avoid highly processed foods, and foods high in saturated/trans fats   Aim for low stress - take time to unwind and care for your mental health   Aim for 150 min of moderate intensity exercise weekly for heart health, and weights twice weekly for bone health   Aim for 7-9 hours of sleep daily   When it comes to diets, agreement about the perfect plan isnt easy to find, even among the experts. Experts at the [facility] [employer] developed an idea known as the Healthy Eating Plate. Just imagine a plate divided into logical, healthy portions.   The emphasis is on diet quality:   Load up on vegetables and fruits - one-half of your plate: Aim for color and variety, and remember that potatoes dont count.   Go for whole grains - one-quarter of your plate: Whole wheat, barley, wheat berries, quinoa, oats, brown rice, and foods made with them. If you want pasta, go with whole wheat pasta.   Protein power - one-quarter of your plate: Fish, chicken, beans, and nuts are all healthy, versatile protein sources. Limit red meat.   The diet, however, does go beyond the plate, offering a few other suggestions.   Use healthy plant oils, such as olive, canola, soy, corn, sunflower and peanut. Check the labels, and avoid partially hydrogenated oil, which have unhealthy trans fats.   If youre thirsty, drink water. Coffee and tea are good in  moderation, but skip sugary drinks and limit milk and dairy  products to one or two daily servings.   The type of carbohydrate in the diet is more important than the amount. Some sources of carbohydrates, such as vegetables, fruits, whole grains, and beans-are healthier than others.   Finally, stay active  Signed, Dub Huntsman, DO  06/22/2024 9:25 PM    [facility]

## 2024-06-19 NOTE — Progress Notes (Unsigned)
;

## 2024-06-20 ENCOUNTER — Ambulatory Visit (INDEPENDENT_AMBULATORY_CARE_PROVIDER_SITE_OTHER)

## 2024-06-20 VITALS — BP 158/82 | Ht 63.0 in | Wt 277.0 lb

## 2024-06-20 DIAGNOSIS — Z0000 Encounter for general adult medical examination without abnormal findings: Secondary | ICD-10-CM | POA: Diagnosis not present

## 2024-06-20 NOTE — Progress Notes (Signed)
 Because this visit was a virtual/telehealth visit,  certain criteria was not obtained, such a blood pressure, CBG if applicable, and timed get up and go. Any medications not marked as taking were not mentioned during the medication reconciliation part of the visit. Any vitals not documented were not able to be obtained due to this being a telehealth visit or patient was unable to self-report a recent blood pressure reading due to a lack of equipment at home via telehealth. Vitals that have been documented are verbally provided by the patient.  This visit was performed by a medical professional under my direct supervision. I was immediately available for consultation/collaboration. I have reviewed and agree with the Annual Wellness Visit documentation.  Subjective:   Andrea Marks is a 73 y.o. who presents for a Medicare Wellness preventive visit.  As a reminder, Annual Wellness Visits don't include a physical exam, and some assessments may be limited, especially if this visit is performed virtually. We may recommend an in-person follow-up visit with your provider if needed.  Visit Complete: Virtual I connected with  Andrea Marks on 06/20/24 by a audio enabled telemedicine application and verified that I am speaking with the correct person using two identifiers.  Patient Location: Home  Provider Location: Home Office  I discussed the limitations of evaluation and management by telemedicine. The patient expressed understanding and agreed to proceed.  Vital Signs: Because this visit was a virtual/telehealth visit, some criteria may be missing or patient reported. Any vitals not documented were not able to be obtained and vitals that have been documented are patient reported.  VideoDeclined- This patient declined Librarian, academic. Therefore the visit was completed with audio only.  Persons Participating in Visit: Patient.  AWV  Questionnaire: Yes: Patient Medicare AWV questionnaire was completed by the patient on 06/15/2024; I have confirmed that all information answered by patient is correct and no changes since this date.  Cardiac Risk Factors include: advanced age (>60men, >62 women);obesity (BMI >30kg/m2);diabetes mellitus;hypertension;Other (see comment);dyslipidemia, Risk factor comments: afib     Objective:    Today's Vitals   06/20/24 1005  BP: (!) 158/82  Weight: 277 lb (125.6 kg)  Height: 5' 3 (1.6 m)   Body mass index is 49.07 kg/m.     06/20/2024   10:05 AM 03/19/2024    1:08 PM 01/09/2024   12:43 PM 01/08/2024    6:58 PM 06/20/2023    1:43 PM 06/08/2022    1:07 PM 10/13/2021    4:19 PM  Advanced Directives  Does Patient Have a Medical Advance Directive? Yes Yes Yes Yes Yes Yes No  Type of Estate agent of [place];Living will Healthcare Power of [place];Living will Healthcare Power of [place];Living will Healthcare Power of [place];Living will Healthcare Power of [place];Living will Living will   Does patient want to make changes to medical advance directive? No - Patient declined No - Guardian declined Yes (Inpatient - patient defers changing a medical advance directive and declines information at this time)  No - Patient declined    Copy of Healthcare Power of Attorney in Chart? No - copy requested No - copy requested No - copy requested  No - copy requested    Would patient like information on creating a medical advance directive?       No - Patient declined    Current Medications (verified) Outpatient Encounter Medications as of 06/20/2024  Medication Sig   acetaminophen  (TYLENOL ) 650 MG CR tablet Take 1,300  mg by mouth 2 (two) times daily as needed for pain. For pain   apixaban  (ELIQUIS ) 5 MG TABS tablet Take 1 tablet (5 mg total) by mouth 2 (two) times daily.   atorvastatin  (LIPITOR) 20 MG tablet TAKE 1 TABLET BY MOUTH EVERYDAY AT BEDTIME   benazepril  (LOTENSIN ) 20  MG tablet Take 1 tablet (20 mg total) by mouth daily.   Continuous Glucose Receiver (FREESTYLE LIBRE 3 READER) DEVI Use as directed. (Patient taking differently: Use as directed.)   Continuous Glucose Sensor (FREESTYLE LIBRE 3 SENSOR) MISC Place 1 sensor on the skin every 14 days. Use to check glucose continuously (Patient taking differently: Place 1 sensor on the skin every 14 days. Use to check glucose continuously)   diltiazem  (CARDIZEM  CD) 240 MG 24 hr capsule TAKE 1 CAPSULE BY MOUTH EVERY DAY   dofetilide  (TIKOSYN ) 500 MCG capsule Take 1 capsule (500 mcg total) by mouth 2 (two) times daily.   DULoxetine  (CYMBALTA ) 60 MG capsule 1 po every day --- start after you finish 30 mg   empagliflozin  (JARDIANCE ) 10 MG TABS tablet Take 1 tablet (10 mg total) by mouth daily before breakfast.   estradiol  (ESTRACE ) 1 MG tablet TAKE 1 TABLET DAILY   ezetimibe  (ZETIA ) 10 MG tablet TAKE 1 TABLET BY MOUTH EVERY DAY   fenofibrate  160 MG tablet Take 1 tablet (160 mg total) by mouth daily.   FLUoxetine  (PROZAC ) 20 MG tablet TAKE 3 TABLETS DAILY   insulin  aspart (NOVOLOG  FLEXPEN) 100 UNIT/ML FlexPen Inject 12-15 Units into the skin 3 (three) times daily with meals. INJECT 12 TO 15 UNITS SUBCUTANEOUSLY 3 TIMES A   DAY WITH MEALS   Insulin  Glargine (BASAGLAR  KWIKPEN) 100 UNIT/ML Inject 30 Units into the skin daily.   magnesium  oxide (MAG-OX) 400 MG tablet Take 1 tablet (400 mg total) by mouth 2 (two) times daily.   metFORMIN  (GLUCOPHAGE ) 850 MG tablet Take 1 tablet in the AM, and 2 tablets in the PM.   OZEMPIC , 2 MG/DOSE, 8 MG/3ML SOPN INJECT 2MG  SUBCUTANEOUSLY  ONCE WEEKLY (EVERY 7 DAYS) AS DIRECTED   tobramycin (TOBREX) 0.3 % ophthalmic solution Place 1 drop into both eyes 4 (four) times daily. Use eye solution for 3 days after eye injections   Facility-Administered Encounter Medications as of 06/20/2024  Medication   ipratropium-albuterol  (DUONEB) 0.5-2.5 (3) MG/3ML nebulizer solution 3 mL    Allergies  (verified) Hydrocodone-acetaminophen , Oxycodone hcl, Penicillins, and Prednisone    History: Past Medical History:  Diagnosis Date   Allergy    Anxiety    Arthritis    Cataract    Depression    Diabetes mellitus    GERD (gastroesophageal reflux disease)    Hyperlipidemia    Hypertension    Obesity    Past Surgical History:  Procedure Laterality Date   ABDOMINAL HYSTERECTOMY  11/01/1989   BSO   EYE SURGERY     IR ABLATE LIVER CRYOABLATION  12/31/2021   IR RADIOLOGIST EVAL & MGMT  12/11/2021   IR RADIOLOGIST EVAL & MGMT  01/07/2022   IR RADIOLOGIST EVAL & MGMT  02/15/2022   Family History  Problem Relation Age of Onset   Macular degeneration Mother    Heart disease Mother        syncope   Aortic stenosis Mother    Lung disease Father 71       mesothelioma   Cancer Maternal Aunt        stomach   Heart disease Paternal Uncle  cabg   Diabetes Paternal Uncle    Heart disease Paternal Uncle    Diabetes Paternal Uncle    Diabetes Paternal Uncle    Heart disease Paternal Uncle    Heart disease Paternal Uncle    Heart disease Paternal Uncle    Lung cancer Other        asbestos   Diabetes Paternal Grandmother    Cancer Other        lung   Social History   Socioeconomic History   Marital status: Widowed    Spouse name: Not on file   Number of children: 0   Years of education: 58   Highest education level: 12th grade  Occupational History   Occupation: retired from IT consultant: RETIRED  Tobacco Use   Smoking status: Former    Current packs/day: 0.00    Types: Cigarettes    Quit date: 1995    Years since quitting: 30.6    Passive exposure: Past   Smokeless tobacco: Never   Tobacco comments:    Former smoker 02/09/24  Vaping Use   Vaping status: Never Used  Substance and Sexual Activity   Alcohol use: No   Drug use: No   Sexual activity: Not Currently    Partners: Male  Other Topics Concern   Not on file  Social History Narrative    Lives with husband   Right Handed   Drinks 3-5 cups caffeine daily   Social Drivers of Health   Financial Resource Strain: Low Risk  (06/15/2024)   Overall Financial Resource Strain (CARDIA)    Difficulty of Paying Living Expenses: Not hard at all  Food Insecurity: No Food Insecurity (06/15/2024)   Hunger Vital Sign    Worried About Running Out of Food in the Last Year: Never true    Ran Out of Food in the Last Year: Never true  Transportation Needs: No Transportation Needs (06/15/2024)   PRAPARE - Administrator, Civil Service (Medical): No    Lack of Transportation (Non-Medical): No  Physical Activity: Inactive (06/15/2024)   Exercise Vital Sign    Days of Exercise per Week: 0 days    Minutes of Exercise per Session: Patient declined  Stress: Stress Concern Present (06/15/2024)   [employer] of Occupational Health - Occupational Stress Questionnaire    Feeling of Stress: To some extent  Social Connections: Socially Isolated (06/15/2024)   Social Connection and Isolation Panel    Frequency of Communication with Friends and Family: More than three times a week    Frequency of Social Gatherings with Friends and Family: Patient declined    Attends Religious Services: Never    Database administrator or Organizations: No    Attends Banker Meetings: Never    Marital Status: Widowed    Tobacco Counseling Counseling given: Not Answered Tobacco comments: Former smoker 02/09/24    Clinical Intake:  Pre-visit preparation completed: Yes  Pain : No/denies pain     BMI - recorded: 49.07 Nutritional Status: BMI > 30  Obese Nutritional Risks: None Diabetes: Yes CBG done?: No Did pt. bring in CBG monitor from home?: No  Lab Results  Component Value Date   HGBA1C 7.5 (A) 06/04/2024   HGBA1C 7.8 (H) 01/09/2024   HGBA1C 8.3 (H) 10/18/2023     How often do you need to have someone help you when you read instructions, pamphlets, or other written  materials from your doctor or pharmacy?: 1 -  Never What is the last grade level you completed in school?: HS graduate  Interpreter Needed?: No  Information entered by :: Jaron Czarnecki,cma   Activities of Daily Living     06/15/2024    7:50 AM 03/19/2024    1:14 PM  In your present state of health, do you have any difficulty performing the following activities:  Hearing? 0   Vision? 0   Difficulty concentrating or making decisions? 0   Walking or climbing stairs? 1   Dressing or bathing? 0   Doing errands, shopping? 0 0  Preparing Food and eating ? N   Using the Toilet? N   In the past six months, have you accidently leaked urine? Y   Do you have problems with loss of bowel control? N   Managing your Medications? N   Managing your Finances? N   Housekeeping or managing your Housekeeping? N     Patient Care Team: Antonio Meth, Jamee SAUNDERS, DO as PCP - General Cindie Ole DASEN, MD as PCP - Electrophysiology (Cardiology) Tobb, Kardie, DO as PCP - Cardiology (Cardiology) Loreda Hacker, DPM as Consulting Physician (Podiatry) Waylan Cain, MD as Consulting Physician (Ophthalmology) Trixie File, MD as Consulting Physician (Internal Medicine) Claudene Arthea HERO, DO as Consulting Physician (Family Medicine) Jarold Mayo, MD as Consulting Physician (Ophthalmology)  I have updated your Care Teams any recent Medical Services you may have received from other providers in the past year.     Assessment:   This is a routine wellness examination for Andrea Marks.  Hearing/Vision screen Hearing Screening - Comments:: No difficulties  Vision Screening - Comments:: Patient wears glasses    Goals Addressed             This Visit's Progress    Patient Stated       Patient would like to feel better        Depression Screen     06/20/2024   10:09 AM 02/14/2024   12:56 PM 06/20/2023    1:49 PM 06/08/2022    1:06 PM 10/13/2021    2:03 PM 09/29/2021    4:13 PM 05/27/2021     9:09 AM  PHQ 2/9 Scores  PHQ - 2 Score 2 1 1 1 3 3 1   PHQ- 9 Score 5    6 6      Fall Risk     06/15/2024    7:50 AM 03/23/2024    1:47 PM 03/05/2024    4:00 PM 02/23/2024    2:53 PM 02/09/2024    2:00 PM  Fall Risk   Falls in the past year? 1 1  1 1   Number falls in past yr: 0 0  0 0  Injury with Fall? 1 1  1 1   Risk for fall due to : History of fall(s);Impaired balance/gait;Impaired mobility History of fall(s);Impaired balance/gait  History of fall(s);Impaired balance/gait;Impaired mobility;Orthopedic patient History of fall(s);Impaired mobility  Risk for fall due to: Comment    patient currently undergoing HH PT OT patient reports benefit   Follow up Falls evaluation completed;Education provided;Falls prevention discussed Falls prevention discussed;Education provided Falls prevention discussed  Education provided;Falls prevention discussed    MEDICARE RISK AT HOME:  Medicare Risk at Home Any stairs in or around the home?: (Patient-Rptd) Yes If so, are there any without handrails?: (Patient-Rptd) No Home free of loose throw rugs in walkways, pet beds, electrical cords, etc?: (Patient-Rptd) Yes Adequate lighting in your home to reduce risk of falls?: (Patient-Rptd) Yes Life alert?: (Patient-Rptd)  Yes Use of a cane, walker or w/c?: (Patient-Rptd) Yes Grab bars in the bathroom?: (Patient-Rptd) Yes Shower chair or bench in shower?: (Patient-Rptd) No Elevated toilet seat or a handicapped toilet?: (Patient-Rptd) No  TIMED UP AND GO:  Was the test performed?  No  Cognitive Function: 6CIT completed        06/20/2024   10:10 AM 06/20/2023    1:53 PM  6CIT Screen  What Year? 0 points 0 points  What month? 0 points 0 points  What time? 0 points 0 points  Count back from 20 0 points 0 points  Months in reverse 0 points 0 points  Repeat phrase 0 points 0 points  Total Score 0 points 0 points    Immunizations Immunization History  Administered Date(s) Administered   PFIZER  Comirnaty (Gray Top)Covid-19 Tri-Sucrose Vaccine 03/19/2021   PFIZER(Purple Top)SARS-COV-2 Vaccination 01/06/2020, 02/06/2020, 09/22/2020   Pfizer(Comirnaty )Fall Seasonal Vaccine 12 years and older 10/18/2022   Pneumococcal Conjugate-13 08/27/2016   Pneumococcal Polysaccharide-23 05/08/2003, 11/10/2009, 12/15/2018   Td 05/08/2003   Tdap 06/08/2011   Zoster Recombinant(Shingrix) 03/12/2020, 05/16/2020, 05/16/2020   Zoster, Live 02/10/2012    Screening Tests Health Maintenance  Topic Date Due   Hepatitis C Screening  Never done   MAMMOGRAM  02/24/2012   DEXA SCAN  Never done   DTaP/Tdap/Td (3 - Td or Tdap) 06/07/2021   COVID-19 Vaccine (6 - 2024-25 season) 07/03/2023   INFLUENZA VACCINE  06/01/2024   Colonoscopy  10/17/2024 (Originally 02/16/2021)   OPHTHALMOLOGY EXAM  10/31/2024   HEMOGLOBIN A1C  12/05/2024   Diabetic kidney evaluation - eGFR measurement  05/02/2025   FOOT EXAM  05/10/2025   Diabetic kidney evaluation - Urine ACR  06/04/2025   Medicare Annual Wellness (AWV)  06/20/2025   Pneumococcal Vaccine: 50+ Years  Completed   Zoster Vaccines- Shingrix  Completed   HPV VACCINES  Aged Out   Meningococcal B Vaccine  Aged Out    Health Maintenance  Health Maintenance Due  Topic Date Due   Hepatitis C Screening  Never done   MAMMOGRAM  02/24/2012   DEXA SCAN  Never done   DTaP/Tdap/Td (3 - Td or Tdap) 06/07/2021   COVID-19 Vaccine (6 - 2024-25 season) 07/03/2023   INFLUENZA VACCINE  06/01/2024   Health Maintenance Items Addressed:patient declined   Additional Screening:  Vision Screening: Recommended annual ophthalmology exams for early detection of glaucoma and other disorders of the eye. Would you like a referral to an eye doctor?   No  Dental Screening: Recommended annual dental exams for proper oral hygiene  Community Resource Referral / Chronic Care Management: CRR required this visit?  No   CCM required this visit?  No   Plan:    I have personally  reviewed and noted the following in the patient's chart:   Medical and social history Use of alcohol, tobacco or illicit drugs  Current medications and supplements including opioid prescriptions. Patient is not currently taking opioid prescriptions. Functional ability and status Nutritional status Physical activity Advanced directives List of other physicians Hospitalizations, surgeries, and ER visits in previous 12 months Vitals Screenings to include cognitive, depression, and falls Referrals and appointments  In addition, I have reviewed and discussed with patient certain preventive protocols, quality metrics, and best practice recommendations. A written personalized care plan for preventive services as well as general preventive health recommendations were provided to patient.   Andrea Marks Right, [name]   06/20/2024   After Visit Summary: (MyChart) Due to this being  a telephonic visit, the after visit summary with patients personalized plan was offered to patient via MyChart   Notes: Nothing significant to report at this time.

## 2024-06-20 NOTE — Patient Instructions (Signed)
 Ms. Andrea Marks , Thank you for taking time out of your busy schedule to complete your Annual Wellness Visit with me. I enjoyed our conversation and look forward to speaking with you again next year. I, as well as your care team,  appreciate your ongoing commitment to your health goals. Please review the following plan we discussed and let me know if I can assist you in the future. Your Game plan/ To Do List    Referrals: If you haven't heard from the office you've been referred to, please reach out to them at the phone provided.   Follow up Visits: We will see or speak with you next year for your Next Medicare AWV with our clinical staff Have you seen your provider in the last 6 months (3 months if uncontrolled diabetes)? Yes  Clinician Recommendations:  Aim for 30 minutes of exercise or brisk walking, 6-8 glasses of water, and 5 servings of fruits and vegetables each day.       This is a list of the screenings recommended for you:  Health Maintenance  Topic Date Due   Hepatitis C Screening  Never done   Mammogram  02/24/2012   DEXA scan (bone density measurement)  Never done   DTaP/Tdap/Td vaccine (3 - Td or Tdap) 06/07/2021   COVID-19 Vaccine (6 - 2024-25 season) 07/03/2023   Flu Shot  06/01/2024   Colon Cancer Screening  10/17/2024*   Eye exam for diabetics  10/31/2024   Hemoglobin A1C  12/05/2024   Yearly kidney function blood test for diabetes  05/02/2025   Complete foot exam   05/10/2025   Yearly kidney health urinalysis for diabetes  06/04/2025   Medicare Annual Wellness Visit  06/20/2025   Pneumococcal Vaccine for age over 16  Completed   Zoster (Shingles) Vaccine  Completed   HPV Vaccine  Aged Out   Meningitis B Vaccine  Aged Out  *Topic was postponed. The date shown is not the original due date.    Advanced directives: (Declined) Advance directive discussed with you today. Even though you declined this today, please call our office should you change your mind, and we can  give you the proper paperwork for you to fill out. Advance Care Planning is important because it:  [x]  Makes sure you receive the medical care that is consistent with your values, goals, and preferences  [x]  It provides guidance to your family and loved ones and reduces their decisional burden about whether or not they are making the right decisions based on your wishes.  Follow the link provided in your after visit summary or read over the paperwork we have mailed to you to help you started getting your Advance Directives in place. If you need assistance in completing these, please reach out to us  so that we can help you!  See attachments for Preventive Care and Fall Prevention Tips.

## 2024-06-27 ENCOUNTER — Other Ambulatory Visit: Payer: Self-pay | Admitting: Internal Medicine

## 2024-06-27 ENCOUNTER — Other Ambulatory Visit: Payer: Self-pay | Admitting: Family Medicine

## 2024-06-27 DIAGNOSIS — I10 Essential (primary) hypertension: Secondary | ICD-10-CM

## 2024-07-03 ENCOUNTER — Other Ambulatory Visit: Payer: Self-pay | Admitting: Cardiology

## 2024-07-03 DIAGNOSIS — E782 Mixed hyperlipidemia: Secondary | ICD-10-CM

## 2024-07-12 ENCOUNTER — Ambulatory Visit (INDEPENDENT_AMBULATORY_CARE_PROVIDER_SITE_OTHER): Admitting: Podiatry

## 2024-07-12 ENCOUNTER — Encounter: Payer: Self-pay | Admitting: Podiatry

## 2024-07-12 VITALS — Ht 63.0 in | Wt 277.0 lb

## 2024-07-12 DIAGNOSIS — E1151 Type 2 diabetes mellitus with diabetic peripheral angiopathy without gangrene: Secondary | ICD-10-CM

## 2024-07-12 DIAGNOSIS — M79676 Pain in unspecified toe(s): Secondary | ICD-10-CM

## 2024-07-12 DIAGNOSIS — Z794 Long term (current) use of insulin: Secondary | ICD-10-CM

## 2024-07-12 DIAGNOSIS — B351 Tinea unguium: Secondary | ICD-10-CM | POA: Diagnosis not present

## 2024-07-12 NOTE — Progress Notes (Signed)
This patient returns to my office for at risk foot care.  This patient requires this care by a professional since this patient will be at risk due to having diabetes and history of thrombophlebitis.  This patient says her callus on her big toes is painful walking and wearing her shoes.  She is unable to self treat.  This patient presents for at risk foot care today.  General Appearance  Alert, conversant and in no acute stress.  Vascular  Dorsalis pedis and posterior tibial  pulses are not  palpable  Bilaterally  Due to swelling..  Capillary return is within normal limits  bilaterally. Temperature is within normal limits  bilaterally.  Neurologic  Senn-Weinstein monofilament wire test diminished  bilaterally. Muscle power within normal limits bilaterally.  Nails Thick disfigured discolored nails with subungual debris  from hallux to fifth toes bilaterally. No evidence of bacterial infection or drainage bilaterally.  Orthopedic  No limitations of motion  feet .  No crepitus or effusions noted.  No bony pathology or digital deformities noted.  Skin  normotropic skin with no porokeratosis noted bilaterally.  No signs of infections or ulcers noted.   Pinch callus  B/L.   No infection or drainage noted.  Onychomycosis  Pain in right toes  Pain in left toes  Pinch callus  B/l.  Consent was obtained for treatment procedures.   Mechanical debridement of nails 1-5  bilaterally performed with a nail nipper.  Filed with dremel without incident.  Debride callus with dremel tool.   Return office visit   9  weeks                  Told patient to return for periodic foot care and evaluation due to potential at risk complications.   Helane Gunther DPM

## 2024-07-27 NOTE — Progress Notes (Signed)
 Andrea Marks                                          MRN: [id number]   07/27/2024   The [facility] Quality Team Specialist reviewed this patient medical record for the purposes of chart review for care gap closure. The following were reviewed: chart review for care gap closure-breast cancer screening and colorectal cancer screening. Patient declined both screenings    [facility] Quality Team

## 2024-07-30 ENCOUNTER — Other Ambulatory Visit: Payer: Self-pay | Admitting: Family Medicine

## 2024-07-30 DIAGNOSIS — E785 Hyperlipidemia, unspecified: Secondary | ICD-10-CM

## 2024-08-15 ENCOUNTER — Ambulatory Visit (HOSPITAL_COMMUNITY)
Admission: RE | Admit: 2024-08-15 | Discharge: 2024-08-15 | Disposition: A | Discharge status: Home / Self Care | Source: Ambulatory Visit | Attending: Physician Assistant | Admitting: Physician Assistant

## 2024-08-15 VITALS — BP 156/62 | HR 69 | Ht 63.0 in | Wt 281.0 lb

## 2024-08-15 DIAGNOSIS — I493 Ventricular premature depolarization: Secondary | ICD-10-CM

## 2024-08-15 DIAGNOSIS — I480 Paroxysmal atrial fibrillation: Secondary | ICD-10-CM | POA: Diagnosis not present

## 2024-08-15 DIAGNOSIS — Z5181 Encounter for therapeutic drug level monitoring: Secondary | ICD-10-CM

## 2024-08-15 DIAGNOSIS — D6869 Other thrombophilia: Secondary | ICD-10-CM | POA: Diagnosis not present

## 2024-08-15 DIAGNOSIS — I4819 Other persistent atrial fibrillation: Secondary | ICD-10-CM

## 2024-08-15 DIAGNOSIS — I4891 Unspecified atrial fibrillation: Secondary | ICD-10-CM | POA: Diagnosis not present

## 2024-08-15 DIAGNOSIS — Z79899 Other long term (current) drug therapy: Secondary | ICD-10-CM

## 2024-08-15 MED ORDER — MAGNESIUM OXIDE 400 MG PO TABS: 400.0000 mg | ORAL_TABLET | Freq: Two times a day (BID) | ORAL | 2 refills | Status: AC

## 2024-08-15 MED ORDER — APIXABAN 5 MG PO TABS: 5.0000 mg | ORAL_TABLET | Freq: Two times a day (BID) | ORAL | 2 refills | Status: DC

## 2024-08-15 MED ORDER — DILTIAZEM HCL ER COATED BEADS 240 MG PO CP24: 240.0000 mg | ORAL_CAPSULE | Freq: Every day | ORAL | 2 refills | Status: AC

## 2024-08-15 MED ORDER — DOFETILIDE 500 MCG PO CAPS: 500.0000 ug | ORAL_CAPSULE | Freq: Two times a day (BID) | ORAL | 2 refills | Status: AC

## 2024-08-15 NOTE — Progress Notes (Signed)
 Primary Care Physician: Antonio Meth, Jamee SAUNDERS, DO Primary Cardiologist: Kardie Tobb, DO Electrophysiologist: OLE ONEIDA HOLTS, MD  Referring Physician: Dr Alvan Record Andrea Marks is a 73 y.o. female with a history of DM, HLD, HTN, atrial fibrillation who presents for follow up in the [facility].  The patient was initially diagnosed with atrial fibrillation 01/08/24 after presenting to the ED after a mechanical fall in her kitchen. EMS was called and she was found to be in afib with RVR. She was transported to the ED and started on IV diltiazem  which converted her to SR. Patient was not started on stroke prevention at that time due to a very large hematoma on her left flank. A two week cardiac monitor was ordered which showed 40% afib burden.   Patient is s/p Tikosyn  admission 5/19-22/2025. The patient converted chemically and did not require cardioversion.   Patient returns for follow up for atrial fibrillation and dofetilide  monitoring. She reports that she has done well since her last visit. She had two brief episodes of afib detected on her Crist mobile, otherwise has been maintaining SR. No bleeding issues on anticoagulation.   Today, she  denies symptoms of palpitations, chest pain, shortness of breath, orthopnea, PND, lower extremity edema, dizziness, presyncope, syncope, bleeding, or neurologic sequela. The patient is tolerating medications without difficulties and is otherwise without complaint today.    Atrial Fibrillation Risk Factors:  she does have symptoms or diagnosis of sleep apnea. Mild on study 03/13/24 she does not have a history of rheumatic fever. she does not have a history of alcohol use. The patient does have a history of early familial atrial fibrillation or other arrhythmias. Mother had afib.   Atrial Fibrillation Management history:  Previous antiarrhythmic drugs: Tikosyn  Previous cardioversions: none  Previous  ablations: none Anticoagulation history: Eliquis   ROS- All systems are reviewed and negative except as per the HPI above.  Past Medical History:  Diagnosis Date   Allergy    Anxiety    Arthritis    Cataract    Depression    Diabetes mellitus    GERD (gastroesophageal reflux disease)    Hyperlipidemia    Hypertension    Obesity     Current Outpatient Medications  Medication Sig Dispense Refill   acetaminophen  (TYLENOL ) 650 MG CR tablet Take 1,300 mg by mouth 2 (two) times daily as needed for pain. For pain (Patient taking differently: Take 1,300 mg by mouth as needed for pain. For pain)     atorvastatin  (LIPITOR) 20 MG tablet TAKE 1 TABLET AT BEDTIME 90 tablet 3   benazepril  (LOTENSIN ) 20 MG tablet Take 1 tablet (20 mg total) by mouth daily. 90 tablet 0   Continuous Glucose Receiver (FREESTYLE LIBRE 3 READER) DEVI Use as directed. 1 each 0   Continuous Glucose Sensor (FREESTYLE LIBRE 3 SENSOR) MISC Place 1 sensor on the skin every 14 days. Use to check glucose continuously 6 each 3   DULoxetine  (CYMBALTA ) 60 MG capsule 1 po every day --- start after you finish 30 mg (Patient taking differently: Take 60 mg by mouth daily.) 90 capsule 3   empagliflozin  (JARDIANCE ) 10 MG TABS tablet Take 1 tablet (10 mg total) by mouth daily before breakfast. 90 tablet 3   estradiol  (ESTRACE ) 1 MG tablet TAKE 1 TABLET DAILY 90 tablet 1   ezetimibe  (ZETIA ) 10 MG tablet TAKE 1 TABLET DAILY 90 tablet 3   fenofibrate  160 MG tablet TAKE 1 TABLET  DAILY 90 tablet 0   FLUoxetine  (PROZAC ) 20 MG tablet TAKE 3 TABLETS DAILY 270 tablet 1   insulin  aspart (NOVOLOG  FLEXPEN) 100 UNIT/ML FlexPen Inject 12-15 Units into the skin 3 (three) times daily with meals. INJECT 12 TO 15 UNITS SUBCUTANEOUSLY 3 TIMES A   DAY WITH MEALS 45 mL 3   Insulin  Glargine (BASAGLAR  KWIKPEN) 100 UNIT/ML Inject 30 Units into the skin daily.     metFORMIN  (GLUCOPHAGE ) 850 MG tablet Take 1 tablet in the AM, and 2 tablets in the PM.      OZEMPIC , 2 MG/DOSE, 8 MG/3ML SOPN INJECT 2MG  SUBCUTANEOUSLY  ONCE WEEKLY (EVERY 7 DAYS) AS DIRECTED 9 mL 3   tobramycin (TOBREX) 0.3 % ophthalmic solution Place 1 drop into both eyes 4 (four) times daily. Use eye solution for 3 days after eye injections     apixaban  (ELIQUIS ) 5 MG TABS tablet Take 1 tablet (5 mg total) by mouth 2 (two) times daily. 180 tablet 2   diltiazem  (CARDIZEM  CD) 240 MG 24 hr capsule Take 1 capsule (240 mg total) by mouth daily. 90 capsule 2   dofetilide  (TIKOSYN ) 500 MCG capsule Take 1 capsule (500 mcg total) by mouth 2 (two) times daily. 180 capsule 2   magnesium  oxide (MAG-OX) 400 MG tablet Take 1 tablet (400 mg total) by mouth 2 (two) times daily. 180 tablet 2   No current facility-administered medications for this encounter.    Physical Exam: BP (!) 156/62   Pulse 69   Ht 5' 3 (1.6 m)   Wt 127.5 kg   BMI 49.78 kg/m   GEN: Well nourished, well developed in no acute distress CARDIAC: Regular rate and rhythm with occasional ectopy, no murmurs, rubs, gallops RESPIRATORY:  Clear to auscultation without rales, wheezing or rhonchi  ABDOMEN: Soft, non-tender, non-distended EXTREMITIES:  No edema; No deformity    Wt Readings from Last 3 Encounters:  08/15/24 127.5 kg  07/12/24 125.6 kg  06/20/24 125.6 kg     EKG today demonstrates  SR with frequent PVCs Vent. rate 69 BPM PR interval 188 ms QRS duration 80 ms QT/QTcB 412/441 ms   Echo 01/09/24 demonstrated   1. Left ventricular ejection fraction, by estimation, is 55 to 60%. The  left ventricle has normal function. The left ventricle has no regional  wall motion abnormalities. There is mild concentric left ventricular  hypertrophy. Left ventricular diastolic parameters were normal.   2. Right ventricular systolic function is normal. The right ventricular  size is normal. Mildly increased right ventricular wall thickness.  Tricuspid regurgitation signal is inadequate for assessing PA pressure.   3.  Left atrial size was mildly dilated.   4. A small pericardial effusion is present. The pericardial effusion is  posterior to the left ventricle. There is no evidence of cardiac  tamponade.   5. The mitral valve is normal in structure. No evidence of mitral valve  regurgitation.   6. The aortic valve is tricuspid. Aortic valve regurgitation is not  visualized.   7. The inferior vena cava is normal in size with <50% respiratory  variability, suggesting right atrial pressure of 8 mmHg.    CHA2DS2-VASc Score = 4  The patient's score is based upon: CHF History: 0 HTN History: 1 Diabetes History: 1 Stroke History: 0 Vascular Disease History: 0 Age Score: 1 Gender Score: 1       ASSESSMENT AND PLAN: Paroxysmal Atrial Fibrillation/atrial flutter (ICD10:  I48.0) The patient's CHA2DS2-VASc score is 4, indicating a 4.8%  annual risk of stroke.   S/p dofetilide  admission 03/2024 Patient appears to be maintaining SR with infrequent, brief episodes.  Continue dofetilide  500 mcg BID Continue diltiazem  240 mg daily Continue Eliquis  5 mg BID  Secondary Hypercoagulable State (ICD10:  D68.69) The patient is at significant risk for stroke/thromboembolism based upon her CHA2DS2-VASc Score of 4.  Continue Apixaban  (Eliquis ). No bleeding issues.   High Risk Medication Monitoring (ICD 10: [id number]) Patient requires ongoing monitoring for anti-arrhythmic medication which has the potential to cause life threatening arrhythmias. QT interval on ECG acceptable for dofetilide  monitoring. Check bmet/mag today. She has much more frequent PVCs on her ECG today compared to previous. Will have her wear a 3 day monitor to assess burden.     OSA  Mild Not currently on CPAP  HTN Mildly elevated today, better controlled at home. No changes today.  Obesity Body mass index is 49.78 kg/m.  Encouraged lifestyle modification   Follow up in the AF clinic in 3 months.      Daril Kicks PA-C Afib  [facility] [address] [place], [place] [address] [phone]

## 2024-08-16 ENCOUNTER — Ambulatory Visit (HOSPITAL_COMMUNITY): Payer: Self-pay | Admitting: Physician Assistant

## 2024-08-16 LAB — BASIC METABOLIC PANEL WITH GFR
BUN/Creatinine Ratio: 29 — ABNORMAL HIGH (ref 12–28)
BUN: 29 mg/dL — ABNORMAL HIGH (ref 8–27)
CO2: 21 mmol/L (ref 20–29)
Calcium: 9.6 mg/dL (ref 8.7–10.3)
Chloride: 98 mmol/L (ref 96–106)
Creatinine, Ser: 0.99 mg/dL (ref 0.57–1.00)
Glucose: 111 mg/dL — ABNORMAL HIGH (ref 70–99)
Potassium: 4.9 mmol/L (ref 3.5–5.2)
Sodium: 143 mmol/L (ref 134–144)
eGFR: 60 mL/min/1.73 (ref >59)

## 2024-08-16 LAB — MAGNESIUM: Magnesium: 2.4 mg/dL — ABNORMAL HIGH (ref 1.6–2.3)

## 2024-08-28 ENCOUNTER — Other Ambulatory Visit: Payer: Self-pay | Admitting: Internal Medicine

## 2024-08-28 DIAGNOSIS — E1165 Type 2 diabetes mellitus with hyperglycemia: Secondary | ICD-10-CM

## 2024-09-04 ENCOUNTER — Other Ambulatory Visit: Payer: Self-pay | Admitting: Family Medicine

## 2024-09-04 DIAGNOSIS — Z780 Asymptomatic menopausal state: Secondary | ICD-10-CM

## 2024-09-17 ENCOUNTER — Other Ambulatory Visit (HOSPITAL_BASED_OUTPATIENT_CLINIC_OR_DEPARTMENT_OTHER): Payer: Self-pay

## 2024-09-17 ENCOUNTER — Ambulatory Visit: Admitting: Family Medicine

## 2024-09-17 ENCOUNTER — Encounter: Payer: Self-pay | Admitting: Family Medicine

## 2024-09-17 VITALS — BP 130/68 | HR 79 | Temp 97.5°F | Resp 18 | Ht 63.0 in | Wt 278.0 lb

## 2024-09-17 DIAGNOSIS — I10 Essential (primary) hypertension: Secondary | ICD-10-CM

## 2024-09-17 DIAGNOSIS — I480 Paroxysmal atrial fibrillation: Secondary | ICD-10-CM

## 2024-09-17 DIAGNOSIS — Z794 Long term (current) use of insulin: Secondary | ICD-10-CM

## 2024-09-17 DIAGNOSIS — E785 Hyperlipidemia, unspecified: Secondary | ICD-10-CM

## 2024-09-17 DIAGNOSIS — E11649 Type 2 diabetes mellitus with hypoglycemia without coma: Secondary | ICD-10-CM

## 2024-09-17 DIAGNOSIS — F418 Other specified anxiety disorders: Secondary | ICD-10-CM

## 2024-09-17 DIAGNOSIS — G4733 Obstructive sleep apnea (adult) (pediatric): Secondary | ICD-10-CM

## 2024-09-17 LAB — CBC WITH DIFFERENTIAL/PLATELET
Basophils Absolute: 0.1 K/uL (ref 0.0–0.1)
Basophils Relative: 0.8 % (ref 0.0–3.0)
Eosinophils Absolute: 0.1 K/uL (ref 0.0–0.7)
Eosinophils Relative: 1.5 % (ref 0.0–5.0)
HCT: 45.3 % (ref 36.0–46.0)
Hemoglobin: 14.3 g/dL (ref 12.0–15.0)
Lymphocytes Relative: 21.8 % (ref 12.0–46.0)
Lymphs Abs: 1.9 K/uL (ref 0.7–4.0)
MCHC: 31.5 g/dL (ref 30.0–36.0)
MCV: 84.6 fl (ref 78.0–100.0)
Monocytes Absolute: 0.5 K/uL (ref 0.1–1.0)
Monocytes Relative: 5.6 % (ref 3.0–12.0)
Neutro Abs: 6.1 K/uL (ref 1.4–7.7)
Neutrophils Relative %: 70.3 % (ref 43.0–77.0)
Platelets: 226 K/uL (ref 150.0–400.0)
RBC: 5.35 Mil/uL — ABNORMAL HIGH (ref 3.87–5.11)
RDW: 18.9 % — ABNORMAL HIGH (ref 11.5–15.5)
WBC: 8.7 K/uL (ref 4.0–10.5)

## 2024-09-17 LAB — LIPID PANEL
Cholesterol: 110 mg/dL (ref 0–200)
HDL: 52.4 mg/dL (ref >39.00)
LDL Cholesterol: 30 mg/dL (ref 0–99)
NonHDL: 57.92
Total CHOL/HDL Ratio: 2
Triglycerides: 142 mg/dL (ref 0.0–149.0)
VLDL: 28.4 mg/dL (ref 0.0–40.0)

## 2024-09-17 LAB — COMPREHENSIVE METABOLIC PANEL WITH GFR
ALT: 10 U/L (ref 0–35)
AST: 11 U/L (ref 0–37)
Albumin: 3.9 g/dL (ref 3.5–5.2)
Alkaline Phosphatase: 35 U/L — ABNORMAL LOW (ref 39–117)
BUN: 32 mg/dL — ABNORMAL HIGH (ref 6–23)
CO2: 27 meq/L (ref 19–32)
Calcium: 9.8 mg/dL (ref 8.4–10.5)
Chloride: 103 meq/L (ref 96–112)
Creatinine, Ser: 1.2 mg/dL (ref 0.40–1.20)
GFR: 44.88 mL/min — ABNORMAL LOW (ref >60.00)
Glucose, Bld: 187 mg/dL — ABNORMAL HIGH (ref 70–99)
Potassium: 5.3 meq/L — ABNORMAL HIGH (ref 3.5–5.1)
Sodium: 142 meq/L (ref 135–145)
Total Bilirubin: 0.5 mg/dL (ref 0.2–1.2)
Total Protein: 5.9 g/dL — ABNORMAL LOW (ref 6.0–8.3)

## 2024-09-17 LAB — MAGNESIUM: Magnesium: 2.4 mg/dL (ref 1.5–2.5)

## 2024-09-17 MED ORDER — DULOXETINE HCL 60 MG PO CPEP: 60.0000 mg | ORAL_CAPSULE | Freq: Every day | ORAL | 3 refills | Status: AC

## 2024-09-17 MED ORDER — COMIRNATY 30 MCG/0.3ML IM SUSY
0.3000 mL | PREFILLED_SYRINGE | Freq: Once | INTRAMUSCULAR | 0 refills | Status: AC
  Filled 2024-09-17: qty 0.3, 1d supply, fill #0

## 2024-09-17 NOTE — Progress Notes (Signed)
 Subjective:    Patient ID: Andrea Marks, female    DOB: 1951-05-17, 73 y.o.   MRN: [id number]  Chief Complaint  Patient presents with   Hypertension   Hyperlipidemia   Follow-up    HPI Patient is in today for f/u chol and bp.  Discussed the use of AI scribe software for clinical note transcription with the patient, who gave verbal consent to proceed.  History of Present Illness Andrea Marks is a 73 year old female who presents for follow-up and medication management.  She has a hard lump, described as 'a little bigger than a baseball,' which is shrinking but still causes discomfort, particularly when sleeping, as it affects her ability to lie on her side.  She has a history of atrial fibrillation and is scheduled to visit the AFib clinic in January or February. She previously saw a cardiologist who advised that she does not need to return for a year.  She experiences discomfort and tenderness in her legs, which she attributes to needing her injection, as it has been three months since her last one. She is aware that the injection affects her blood sugar levels.  She uses a CPAP machine for sleep apnea but is currently not sleeping in her bed due to discomfort from the lump. She sleeps in a recliner with her head elevated and reports no issues with this arrangement.  Her current medications include duloxetine  60 mg daily at bedtime, Zetia  at bedtime, fenofibrate , and Lipitor for cholesterol management. She is on Ozempic  2 mg weekly, taken every Sunday, but does not notice an effect on her appetite. Additionally, she takes Jardiance  daily before breakfast and magnesium  supplements both in the morning and at night.  Socially, she lives alone and has a sister [place] who visits a few times a year. She has been living alone since her husband passed away three years ago and has adjusted to a quiet routine. Her family expresses concern about her living situation,  but she prefers to maintain her independence.    Past Medical History:  Diagnosis Date   Allergy    Anxiety    Arthritis    Cataract    Depression    Diabetes mellitus    GERD (gastroesophageal reflux disease)    Hyperlipidemia    Hypertension    Obesity     Past Surgical History:  Procedure Laterality Date   ABDOMINAL HYSTERECTOMY  11/01/1989   BSO   EYE SURGERY     IR ABLATE LIVER CRYOABLATION  12/31/2021   IR RADIOLOGIST EVAL & MGMT  12/11/2021   IR RADIOLOGIST EVAL & MGMT  01/07/2022   IR RADIOLOGIST EVAL & MGMT  02/15/2022    Family History  Problem Relation Age of Onset   Macular degeneration Mother    Heart disease Mother        syncope   Aortic stenosis Mother    Lung disease Father 20       mesothelioma   Cancer Maternal Aunt        stomach   Heart disease Paternal Uncle        cabg   Diabetes Paternal Uncle    Heart disease Paternal Uncle    Diabetes Paternal Uncle    Diabetes Paternal Uncle    Heart disease Paternal Uncle    Heart disease Paternal Uncle    Heart disease Paternal Uncle    Lung cancer Other        asbestos  Diabetes Paternal Grandmother    Cancer Other        lung    Social History   Socioeconomic History   Marital status: Widowed    Spouse name: Not on file   Number of children: 0   Years of education: 42   Highest education level: 12th grade  Occupational History   Occupation: retired from [employer]: RETIRED  Tobacco Use   Smoking status: Former    Current packs/day: 0.00    Types: Cigarettes    Quit date: 1995    Years since quitting: 30.8    Passive exposure: Past   Smokeless tobacco: Never   Tobacco comments:    Former smoker 02/09/24  Vaping Use   Vaping status: Never Used  Substance and Sexual Activity   Alcohol use: No   Drug use: No   Sexual activity: Not Currently    Partners: Male  Other Topics Concern   Not on file  Social History Narrative   Lives with husband   Right Handed    Drinks 3-5 cups caffeine daily   Social Drivers of Health   Financial Resource Strain: Low Risk  (09/10/2024)   Overall Financial Resource Strain (CARDIA)    Difficulty of Paying Living Expenses: Not hard at all  Food Insecurity: No Food Insecurity (09/10/2024)   Hunger Vital Sign    Worried About Running Out of Food in the Last Year: Never true    Ran Out of Food in the Last Year: Never true  Transportation Needs: No Transportation Needs (09/10/2024)   PRAPARE - Administrator, Civil Service (Medical): No    Lack of Transportation (Non-Medical): No  Physical Activity: Inactive (09/10/2024)   Exercise Vital Sign    Days of Exercise per Week: 0 days    Minutes of Exercise per Session: Not on file  Stress: Stress Concern Present (09/10/2024)   [employer] of Occupational Health - Occupational Stress Questionnaire    Feeling of Stress: To some extent  Social Connections: Socially Isolated (09/10/2024)   Social Connection and Isolation Panel    Frequency of Communication with Friends and Family: More than three times a week    Frequency of Social Gatherings with Friends and Family: Patient declined    Attends Religious Services: Never    Database Administrator or Organizations: No    Attends Engineer, Structural: Not on file    Marital Status: Widowed  Intimate Partner Violence: Not At Risk (06/20/2024)   Humiliation, Afraid, Rape, and Kick questionnaire    Fear of Current or Ex-Partner: No    Emotionally Abused: No    Physically Abused: No    Sexually Abused: No    Outpatient Medications Prior to Visit  Medication Sig Dispense Refill   acetaminophen  (TYLENOL ) 650 MG CR tablet Take 1,300 mg by mouth 2 (two) times daily as needed for pain. For pain (Patient taking differently: Take 1,300 mg by mouth as needed for pain. For pain)     apixaban  (ELIQUIS ) 5 MG TABS tablet Take 1 tablet (5 mg total) by mouth 2 (two) times daily. 180 tablet 2   atorvastatin   (LIPITOR) 20 MG tablet TAKE 1 TABLET AT BEDTIME 90 tablet 3   benazepril  (LOTENSIN ) 20 MG tablet Take 1 tablet (20 mg total) by mouth daily. 90 tablet 0   Continuous Glucose Receiver (FREESTYLE LIBRE 3 READER) DEVI Use as directed. 1 each 0   Continuous Glucose Sensor (FREESTYLE  LIBRE 3 SENSOR) MISC Place 1 sensor on the skin every 14 days. Use to check glucose continuously 6 each 3   diltiazem  (CARDIZEM  CD) 240 MG 24 hr capsule Take 1 capsule (240 mg total) by mouth daily. 90 capsule 2   dofetilide  (TIKOSYN ) 500 MCG capsule Take 1 capsule (500 mcg total) by mouth 2 (two) times daily. 180 capsule 2   empagliflozin  (JARDIANCE ) 10 MG TABS tablet Take 1 tablet (10 mg total) by mouth daily before breakfast. 90 tablet 3   estradiol  (ESTRACE ) 1 MG tablet Take 1 tablet (1 mg total) by mouth daily. 90 tablet 0   ezetimibe  (ZETIA ) 10 MG tablet TAKE 1 TABLET DAILY 90 tablet 3   fenofibrate  160 MG tablet TAKE 1 TABLET DAILY 90 tablet 0   FLUoxetine  (PROZAC ) 20 MG tablet TAKE 3 TABLETS DAILY 270 tablet 1   insulin  aspart (NOVOLOG  FLEXPEN) 100 UNIT/ML FlexPen Inject 12-15 Units into the skin 3 (three) times daily with meals. INJECT 12 TO 15 UNITS SUBCUTANEOUSLY 3 TIMES A   DAY WITH MEALS 45 mL 3   Insulin  Glargine (BASAGLAR  KWIKPEN) 100 UNIT/ML Inject 30 Units into the skin daily.     magnesium  oxide (MAG-OX) 400 MG tablet Take 1 tablet (400 mg total) by mouth 2 (two) times daily. 180 tablet 2   metFORMIN  (GLUCOPHAGE ) 850 MG tablet TAKE 1 TABLET EVERY MORNINGAND TAKE 2 TABLETS EVERY   EVENING 270 tablet 3   OZEMPIC , 2 MG/DOSE, 8 MG/3ML SOPN INJECT 2MG  SUBCUTANEOUSLY  ONCE WEEKLY (EVERY 7 DAYS) AS DIRECTED 9 mL 3   tobramycin (TOBREX) 0.3 % ophthalmic solution Place 1 drop into both eyes 4 (four) times daily. Use eye solution for 3 days after eye injections     DULoxetine  (CYMBALTA ) 60 MG capsule 1 po every day --- start after you finish 30 mg (Patient taking differently: Take 60 mg by mouth daily.) 90 capsule 3    No facility-administered medications prior to visit.    Allergies  Allergen Reactions   Hydrocodone-Acetaminophen  Other (See Comments)    unknown   Oxycodone Hcl Other (See Comments)    unknown   Penicillins Other (See Comments)    unknown   Prednisone      Patient has noted allergy to prednisone  but she says it was a questionable history of mild nausea with prednisone  and nothing more     Review of Systems  Constitutional:  Negative for chills, fever and malaise/fatigue.  HENT:  Negative for congestion and hearing loss.   Eyes:  Negative for blurred vision and discharge.  Respiratory:  Negative for cough, sputum production and shortness of breath.   Cardiovascular:  Negative for chest pain, palpitations and leg swelling.  Gastrointestinal:  Negative for abdominal pain, blood in stool, constipation, diarrhea, heartburn, nausea and vomiting.  Genitourinary:  Negative for dysuria, frequency, hematuria and urgency.  Musculoskeletal:  Negative for back pain, falls and myalgias.  Skin:  Negative for rash.  Neurological:  Negative for dizziness, sensory change, loss of consciousness, weakness and headaches.  Endo/Heme/Allergies:  Negative for environmental allergies. Does not bruise/bleed easily.  Psychiatric/Behavioral:  Negative for depression and suicidal ideas. The patient is not nervous/anxious and does not have insomnia.        Objective:    Physical Exam Vitals and nursing note reviewed.  Constitutional:      General: She is not in acute distress.    Appearance: Normal appearance. She is well-developed.  HENT:     Head: Normocephalic and atraumatic.  Eyes:     General: No scleral icterus.       Right eye: No discharge.        Left eye: No discharge.  Cardiovascular:     Rate and Rhythm: Normal rate and regular rhythm.     Heart sounds: No murmur heard. Pulmonary:     Effort: Pulmonary effort is normal. No respiratory distress.     Breath sounds: Normal breath  sounds.  Musculoskeletal:        General: Normal range of motion.     Cervical back: Normal range of motion and neck supple.     Right lower leg: No edema.     Left lower leg: No edema.  Skin:    General: Skin is warm and dry.  Neurological:     Mental Status: She is alert and oriented to person, place, and time.  Psychiatric:        Mood and Affect: Mood normal.        Behavior: Behavior normal.        Thought Content: Thought content normal.        Judgment: Judgment normal.     BP 130/68 (BP Location: Right Arm, Patient Position: Sitting, Cuff Size: Large)   Pulse 79   Temp (!) 97.5 F (36.4 C) (Oral)   Resp 18   Ht 5' 3 (1.6 m)   Wt 278 lb (126.1 kg)   SpO2 96%   BMI 49.25 kg/m  Wt Readings from Last 3 Encounters:  09/17/24 278 lb (126.1 kg)  08/15/24 281 lb (127.5 kg)  07/12/24 277 lb (125.6 kg)    Diabetic Foot Exam - Simple   No data filed    Lab Results  Component Value Date   WBC 8.5 01/20/2024   HGB 11.9 (L) 01/20/2024   HCT 37.4 01/20/2024   PLT 247.0 01/20/2024   GLUCOSE 111 (H) 08/15/2024   CHOL 130 10/18/2023   TRIG 120.0 10/18/2023   HDL 58.10 10/18/2023   LDLDIRECT 64.0 04/21/2017   LDLCALC 48 10/18/2023   ALT 9 03/19/2024   AST 12 (L) 03/19/2024   NA 143 08/15/2024   K 4.9 08/15/2024   CL 98 08/15/2024   CREATININE 0.99 08/15/2024   BUN 29 (H) 08/15/2024   CO2 21 08/15/2024   TSH 3.020 01/09/2024   INR 1.2 RATIO 04/19/2007   HGBA1C 7.5 (A) 06/04/2024   MICROALBUR 50.5 06/04/2024    Lab Results  Component Value Date   TSH 3.020 01/09/2024   Lab Results  Component Value Date   WBC 8.5 01/20/2024   HGB 11.9 (L) 01/20/2024   HCT 37.4 01/20/2024   MCV 86.5 01/20/2024   PLT 247.0 01/20/2024   Lab Results  Component Value Date   NA 143 08/15/2024   K 4.9 08/15/2024   CO2 21 08/15/2024   GLUCOSE 111 (H) 08/15/2024   BUN 29 (H) 08/15/2024   CREATININE 0.99 08/15/2024   BILITOT 0.6 03/19/2024   ALKPHOS 24 (L) 03/19/2024    AST 12 (L) 03/19/2024   ALT 9 03/19/2024   PROT 5.3 (L) 03/19/2024   ALBUMIN 2.6 (L) 03/19/2024   CALCIUM  9.6 08/15/2024   ANIONGAP 8 03/22/2024   EGFR 60 08/15/2024   GFR 60.45 01/20/2024   Lab Results  Component Value Date   CHOL 130 10/18/2023   Lab Results  Component Value Date   HDL 58.10 10/18/2023   Lab Results  Component Value Date   LDLCALC 48 10/18/2023   Lab  Results  Component Value Date   TRIG 120.0 10/18/2023   Lab Results  Component Value Date   CHOLHDL 2 10/18/2023   Lab Results  Component Value Date   HGBA1C 7.5 (A) 06/04/2024       Assessment & Plan:  Hyperlipidemia LDL goal <70 -     Lipid panel -     Comprehensive metabolic panel with GFR  Essential hypertension -     Lipid panel -     CBC with Differential/Platelet -     Magnesium   Type 2 diabetes mellitus with hypoglycemia without coma, with long-term current use of insulin  (HCC)  Paroxysmal atrial fibrillation (HCC) -     Lipid panel -     CBC with Differential/Platelet -     Magnesium   Obstructive sleep apnea syndrome  Depression with anxiety -     DULoxetine  HCl; Take 1 capsule (60 mg total) by mouth daily.  Dispense: 90 capsule; Refill: 3   Assessment and Plan Assessment & Plan Type 2 diabetes mellitus   Managed with Ozempic  2 mg weekly and Jardiance  daily. No improvement in appetite control with Ozempic .  Hyperlipidemia   Managed with fenofibrate , Zetia , and Lipitor. She inquired about combining medications but was informed it is not possible.  Paroxysmal atrial fibrillation   Managed with regular follow-ups at the AFib clinic. Next appointment is scheduled for January or February.  Obstructive sleep apnea   Managed with CPAP, but she is not using it due to discomfort from a lump, opting to sleep in a recliner.  Localized swelling, mass and lump, unspecified   Persistent swelling larger than a baseball, described as deep and bothersome, especially when sleeping on her  side. It is shrinking. The orthopedic specialist is monitoring the lump, hesitant to intervene due to its hardness and her atrial fibrillation. The doctor said when it stops doing anything, then he'll decide what to do.  General Health Maintenance   She is preparing for Thanksgiving, having ordered groceries online, making traditional dishes, and trying new recipes, including cooking boneless turkey in a crock pot with orange juice and cranberry sauce.   Andrea Jungbluth R Lowne Chase, DO

## 2024-09-20 ENCOUNTER — Ambulatory Visit: Payer: Self-pay | Admitting: Family Medicine

## 2024-09-20 DIAGNOSIS — N289 Disorder of kidney and ureter, unspecified: Secondary | ICD-10-CM

## 2024-09-23 ENCOUNTER — Encounter: Payer: Self-pay | Admitting: Family Medicine

## 2024-09-27 ENCOUNTER — Encounter: Payer: Self-pay | Admitting: Family Medicine

## 2024-09-27 DIAGNOSIS — I10 Essential (primary) hypertension: Secondary | ICD-10-CM

## 2024-10-01 ENCOUNTER — Other Ambulatory Visit (HOSPITAL_BASED_OUTPATIENT_CLINIC_OR_DEPARTMENT_OTHER): Payer: Self-pay

## 2024-10-01 MED ORDER — BENAZEPRIL HCL 20 MG PO TABS: 20.0000 mg | ORAL_TABLET | Freq: Every day | ORAL | 1 refills | Status: AC

## 2024-10-02 ENCOUNTER — Other Ambulatory Visit: Payer: Self-pay

## 2024-10-04 ENCOUNTER — Ambulatory Visit: Admitting: Internal Medicine

## 2024-10-11 ENCOUNTER — Ambulatory Visit: Admitting: Podiatry

## 2024-10-16 ENCOUNTER — Ambulatory Visit: Admitting: Podiatry

## 2024-10-16 ENCOUNTER — Encounter: Payer: Self-pay | Admitting: Podiatry

## 2024-10-16 DIAGNOSIS — B351 Tinea unguium: Secondary | ICD-10-CM | POA: Diagnosis not present

## 2024-10-16 DIAGNOSIS — M79676 Pain in unspecified toe(s): Secondary | ICD-10-CM | POA: Diagnosis not present

## 2024-10-16 DIAGNOSIS — Z794 Long term (current) use of insulin: Secondary | ICD-10-CM | POA: Diagnosis not present

## 2024-10-16 DIAGNOSIS — E1151 Type 2 diabetes mellitus with diabetic peripheral angiopathy without gangrene: Secondary | ICD-10-CM

## 2024-10-16 NOTE — Progress Notes (Signed)
This patient returns to my office for at risk foot care.  This patient requires this care by a professional since this patient will be at risk due to having diabetes and history of thrombophlebitis.  This patient says her callus on her big toes is painful walking and wearing her shoes.  She is unable to self treat.  This patient presents for at risk foot care today.  General Appearance  Alert, conversant and in no acute stress.  Vascular  Dorsalis pedis and posterior tibial  pulses are not  palpable  Bilaterally  Due to swelling..  Capillary return is within normal limits  bilaterally. Temperature is within normal limits  bilaterally.  Neurologic  Senn-Weinstein monofilament wire test diminished  bilaterally. Muscle power within normal limits bilaterally.  Nails Thick disfigured discolored nails with subungual debris  from hallux to fifth toes bilaterally. No evidence of bacterial infection or drainage bilaterally.  Orthopedic  No limitations of motion  feet .  No crepitus or effusions noted.  No bony pathology or digital deformities noted.  Skin  normotropic skin with no porokeratosis noted bilaterally.  No signs of infections or ulcers noted.   Pinch callus  B/L.   No infection or drainage noted.  Onychomycosis  Pain in right toes  Pain in left toes  Pinch callus  B/l.  Consent was obtained for treatment procedures.   Mechanical debridement of nails 1-5  bilaterally performed with a nail nipper.  Filed with dremel without incident.  Debride callus with dremel tool.   Return office visit   9  weeks                  Told patient to return for periodic foot care and evaluation due to potential at risk complications.   Helane Gunther DPM

## 2024-10-21 ENCOUNTER — Other Ambulatory Visit: Payer: Self-pay | Admitting: Family Medicine

## 2024-10-23 ENCOUNTER — Ambulatory Visit: Admitting: Internal Medicine

## 2024-10-26 ENCOUNTER — Other Ambulatory Visit

## 2024-10-26 ENCOUNTER — Encounter: Payer: Self-pay | Admitting: Internal Medicine

## 2024-10-26 ENCOUNTER — Ambulatory Visit (INDEPENDENT_AMBULATORY_CARE_PROVIDER_SITE_OTHER): Admitting: Internal Medicine

## 2024-10-26 VITALS — BP 130/64 | HR 77 | Ht 63.0 in | Wt 282.0 lb

## 2024-10-26 DIAGNOSIS — E11319 Type 2 diabetes mellitus with unspecified diabetic retinopathy without macular edema: Secondary | ICD-10-CM | POA: Diagnosis not present

## 2024-10-26 DIAGNOSIS — E785 Hyperlipidemia, unspecified: Secondary | ICD-10-CM | POA: Diagnosis not present

## 2024-10-26 DIAGNOSIS — Z794 Long term (current) use of insulin: Secondary | ICD-10-CM | POA: Diagnosis not present

## 2024-10-26 LAB — POCT GLYCOSYLATED HEMOGLOBIN (HGB A1C): Hemoglobin A1C: 7.8 % — AB (ref 4.0–5.6)

## 2024-10-26 MED ORDER — TIRZEPATIDE 5 MG/0.5ML ~~LOC~~ SOAJ: 5.0000 mg | SUBCUTANEOUS | 3 refills | Status: AC

## 2024-10-26 NOTE — Patient Instructions (Addendum)
 Please continue: - Metformin  850 mg in am and 1700 mg at night - Jardiance  10 mg before breakfast  Try to change from Ozempic  to: - Mounjaro  5 mg weekly Let me know if we can increase the dose  Please increase: - Basaglar   36-40 units at bedtime - NovoLog  15 min before meals: 10-20 units before meals (if you inject after the meals, take only 50% of the dose) Around the time of steroids, take a higher NovoLog  dose (maybe up to 30 units).  Please return in 3-4 months.

## 2024-10-26 NOTE — Addendum Note (Signed)
 Addended by: CLEOTILDE ROLIN RAMAN on: 10/26/2024 02:35 PM   Modules accepted: Orders

## 2024-10-26 NOTE — Progress Notes (Addendum)
 Patient ID: Andrea Marks, female   DOB: 03-29-1951, 73 y.o.   MRN: [id number]  HPI: Andrea Marks is a 73 y.o.-year-old female, returning for f/u for DM2, dx 1994, insulin -dependent, uncontrolled, with complications (diabetic retinopathy). Last visit was 4 months ago.  Interim history: No increased urination, nausea, chest pain.  She has B knee pain (has 2 fractures in R knee - had a nerve ablation).  She continues steroid injections every 3-4 months.   She unfortunately is not able to function without the injections but tries to stretch them every 3 to 4 months.  Sugars are usually higher after these. She continues to have a hematoma on the lateral side of her abdomen after her fall in 12/2023.  This is slowly decreasing in size but she has problems sleeping at night because of not finding a comfortable position.  She still sleeps in the recliner. She is feeling down during the holidays as she does not have family close.  She does mention that she relaxed her diet but she has been taking her medications consistently.  Reviewed HbA1c levels: Lab Results  Component Value Date   HGBA1C 7.5 (A) 06/04/2024   HGBA1C 7.8 (H) 01/09/2024   HGBA1C 8.3 (H) 10/18/2023   HGBA1C 6.6 (A) 08/04/2023   HGBA1C 5.9 (A) 12/22/2022   HGBA1C 6.2 10/18/2022   HGBA1C 5.8 (A) 08/20/2022   HGBA1C 5.6 03/18/2022   HGBA1C 5.6 11/16/2021   HGBA1C 5.9 (H) 02/26/2021   HGBA1C 6.0 (A) 12/18/2020   HGBA1C 5.9 (A) 08/14/2020   HGBA1C 6.9 (A) 04/14/2020   HGBA1C 7.3 (A) 01/04/2020   HGBA1C 10.5 (H) 09/10/2019   HGBA1C 10.4 (H) 12/15/2018   HGBA1C 7.5 (A) 05/18/2018   HGBA1C 11.2 (H) 12/19/2017   HGBA1C 11.3 (H) 04/21/2017   HGBA1C 7.8 (H) 06/11/2016   She is on: - Metformin  850 mg in am and  1700 mg at night - Jardiance  10 mg before b'fast-started 06/2024 - Ozempic  1 >> 2 mg weekly - Basaglar  30 units at bedtime - NovoLog  15 min before meals: 10-15 units before b'fast and lunch 10-15  units before dinner  (if you inject after the meals, take only 50% of the dose) Around the time of steroids, take a higher NovoLog  dose (maybe up to 30 units). Tried Bydureon  once a week >> she had problems injecting the medication.  She also tried Victoza . She was on Humalog  in the past. She was on Amaryl  in the past, which we stopped going to increase her insulin  doses 05/2018. She was on a Vgo pump, which we stopped in 2019 as she needed higher doses of insulin .  She checks her sugars more than 4 times a day with her freestyle libre 2 CGM (prefers to check with receiver):  Prev.:  Previously:  Lowest blood sugars: 58 >> 41 >> 60s. Highest blood sugars: 300s >> 200s >> 300s.  Meals: - Breakfast: egg + bagel + yoghurt - Lunch: may skip, salad - Dinner: salad, hamburger/hot dog, tuna fish - Snacks: potato chips, pretzel No sodas, only drinks sparkling water.  No CKD, last BUN/creatinine was:  Lab Results  Component Value Date   BUN 32 (H) 09/17/2024   CREATININE 1.20 09/17/2024   Lab Results  Component Value Date   MICRALBCREAT 463 (H) 06/04/2024   MICRALBCREAT 5.4 07/12/2014   MICRALBCREAT 15.3 06/09/2012   MICRALBCREAT 1.4 11/23/2010   MICRALBCREAT 6.5 12/20/2008  08/04/2023: Microalbumin 23.7 On benazepril .  + HL; Last set of lipids: Lab  Results  Component Value Date   CHOL 110 09/17/2024   HDL 52.40 09/17/2024   LDLCALC 30 09/17/2024   LDLDIRECT 64.0 04/21/2017   TRIG 142.0 09/17/2024   CHOLHDL 2 09/17/2024  Prev. On Vytorin  >> Lipitor 20 + Zetia  10 mg daily and fenofibrate  160.  - Last eye exam: 2025: + DR. She has a history of cataract surgery.   She was admitted with blurry vision, papilledema in 02/26/2021.  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 She saw a retina specialist and a neurologist.  She was on steroids. She started IO injections. Has these every 3 mo  - + numbness and tingling in her feet.  She sees podiatry.   Last foot exam 05/10/2024 by Dr. Loreda.  She had an ulcer in lower right leg (has lymphedema) >> healed. She also has a history of HTN, GERD, depression, h/o thrombophlebitis. Previously on B12 injections now on 1000 mcg p.o. B12 daily. In 03/2022, she lost consciousness after a vomiting episode.  She was down, on the floor, near her bed, the entire night, as she could not get up.  Since then, she ordered another alert bracelet and also an alert button for the shower.  She was not sure if her sugars were low at that time but the investigation was negative for culprits when paramedics came. She fell 01/08/2024 in her kitchen - sBP 64 as checked by ambulance.  In the emergency room, she was found to be in the A-fib.  She was admitted.  She was started on Cardizem  and her statin was changed.  She was found to have sleep apnea in 2024.  ROS: + see HPI  I reviewed pt's medications, allergies, PMH, social hx, family hx, and changes were documented in the history of present illness. Otherwise, unchanged from my initial visit note.  Past Medical History:  Diagnosis Date   Allergy    Anxiety    Arthritis    Cataract    Depression    Diabetes mellitus    GERD (gastroesophageal reflux disease)    Hyperlipidemia    Hypertension    Obesity    Past Surgical History:  Procedure Laterality Date   ABDOMINAL HYSTERECTOMY  11/01/1989   BSO   EYE SURGERY     IR ABLATE LIVER CRYOABLATION  12/31/2021   IR RADIOLOGIST EVAL & MGMT  12/11/2021   IR RADIOLOGIST EVAL & MGMT  01/07/2022   IR RADIOLOGIST EVAL & MGMT  02/15/2022   Social History   Socioeconomic History   Marital status: Widowed    Spouse name: Not on file   Number of children: 0   Years of education: 58   Highest education level: 12th grade  Occupational History   Occupation: retired from [employer]: RETIRED  Tobacco Use   Smoking status: Former    Current packs/day: 0.00    Types: Cigarettes    Quit date: 1995     Years since quitting: 31.0    Passive exposure: Past   Smokeless tobacco: Never   Tobacco comments:    Former smoker 02/09/24  Vaping Use   Vaping status: Never Used  Substance and Sexual Activity   Alcohol use: No   Drug use: No   Sexual activity: Not Currently    Partners: Male  Other Topics Concern   Not on file  Social History Narrative   Lives with husband   Right Handed   Drinks 3-5 cups caffeine daily  Social Drivers of Health   Tobacco Use: Medium Risk (10/16/2024)   Patient History    Smoking Tobacco Use: Former    Smokeless Tobacco Use: Never    Passive Exposure: Past  Physicist, Medical Strain: Low Risk (09/10/2024)   Overall Financial Resource Strain (CARDIA)    Difficulty of Paying Living Expenses: Not hard at all  Food Insecurity: No Food Insecurity (09/10/2024)   Epic    Worried About Programme Researcher, Broadcasting/film/video in the Last Year: Never true    Ran Out of Food in the Last Year: Never true  Transportation Needs: No Transportation Needs (09/10/2024)   Epic    Lack of Transportation (Medical): No    Lack of Transportation (Non-Medical): No  Physical Activity: Inactive (09/10/2024)   Exercise Vital Sign    Days of Exercise per Week: 0 days    Minutes of Exercise per Session: Not on file  Stress: Stress Concern Present (09/10/2024)   [employer] of Occupational Health - Occupational Stress Questionnaire    Feeling of Stress: To some extent  Social Connections: Socially Isolated (09/10/2024)   Social Connection and Isolation Panel    Frequency of Communication with Friends and Family: More than three times a week    Frequency of Social Gatherings with Friends and Family: Patient declined    Attends Religious Services: Never    Database Administrator or Organizations: No    Attends Engineer, Structural: Not on file    Marital Status: Widowed  Intimate Partner Violence: Not At Risk (06/20/2024)   Epic    Fear of Current or Ex-Partner: No     Emotionally Abused: No    Physically Abused: No    Sexually Abused: No  Depression (PHQ2-9): Low Risk (09/17/2024)   Depression (PHQ2-9)    PHQ-2 Score: 0  Recent Concern: Depression (PHQ2-9) - Medium Risk (06/20/2024)   Depression (PHQ2-9)    PHQ-2 Score: 5  Alcohol Screen: Low Risk (06/15/2024)   Alcohol Screen    Last Alcohol Screening Score (AUDIT): 0  Housing: Unknown (09/10/2024)   Epic    Unable to Pay for Housing in the Last Year: No    Number of Times Moved in the Last Year: Not on file    Homeless in the Last Year: No  Utilities: Not At Risk (06/20/2024)   Epic    Threatened with loss of utilities: No  Health Literacy: Adequate Health Literacy (06/20/2024)   B1300 Health Literacy    Frequency of need for help with medical instructions: Never   Current Outpatient Medications on File Prior to Visit  Medication Sig Dispense Refill   acetaminophen  (TYLENOL ) 650 MG CR tablet Take 1,300 mg by mouth 2 (two) times daily as needed for pain. For pain (Patient taking differently: Take 1,300 mg by mouth as needed for pain. For pain)     apixaban  (ELIQUIS ) 5 MG TABS tablet Take 1 tablet (5 mg total) by mouth 2 (two) times daily. 180 tablet 2   atorvastatin  (LIPITOR) 20 MG tablet TAKE 1 TABLET AT BEDTIME 90 tablet 3   benazepril  (LOTENSIN ) 20 MG tablet Take 1 tablet (20 mg total) by mouth daily. 90 tablet 1   Continuous Glucose Receiver (FREESTYLE LIBRE 3 READER) DEVI Use as directed. 1 each 0   Continuous Glucose Sensor (FREESTYLE LIBRE 3 SENSOR) MISC Place 1 sensor on the skin every 14 days. Use to check glucose continuously 6 each 3   diltiazem  (CARDIZEM  CD) 240 MG 24 hr capsule  Take 1 capsule (240 mg total) by mouth daily. 90 capsule 2   dofetilide  (TIKOSYN ) 500 MCG capsule Take 1 capsule (500 mcg total) by mouth 2 (two) times daily. 180 capsule 2   DULoxetine  (CYMBALTA ) 60 MG capsule Take 1 capsule (60 mg total) by mouth daily. 90 capsule 3   empagliflozin  (JARDIANCE ) 10 MG TABS  tablet Take 1 tablet (10 mg total) by mouth daily before breakfast. 90 tablet 3   estradiol  (ESTRACE ) 1 MG tablet Take 1 tablet (1 mg total) by mouth daily. 90 tablet 0   ezetimibe  (ZETIA ) 10 MG tablet TAKE 1 TABLET DAILY 90 tablet 3   fenofibrate  160 MG tablet TAKE 1 TABLET DAILY 90 tablet 0   FLUoxetine  (PROZAC ) 20 MG tablet TAKE 3 TABLETS DAILY 270 tablet 1   insulin  aspart (NOVOLOG  FLEXPEN) 100 UNIT/ML FlexPen Inject 12-15 Units into the skin 3 (three) times daily with meals. INJECT 12 TO 15 UNITS SUBCUTANEOUSLY 3 TIMES A   DAY WITH MEALS 45 mL 3   Insulin  Glargine (BASAGLAR  KWIKPEN) 100 UNIT/ML Inject 30 Units into the skin daily.     magnesium  oxide (MAG-OX) 400 MG tablet Take 1 tablet (400 mg total) by mouth 2 (two) times daily. 180 tablet 2   metFORMIN  (GLUCOPHAGE ) 850 MG tablet TAKE 1 TABLET EVERY MORNINGAND TAKE 2 TABLETS EVERY   EVENING 270 tablet 3   OZEMPIC , 2 MG/DOSE, 8 MG/3ML SOPN INJECT 2MG  SUBCUTANEOUSLY  ONCE WEEKLY (EVERY 7 DAYS) AS DIRECTED 9 mL 3   tobramycin (TOBREX) 0.3 % ophthalmic solution Place 1 drop into both eyes 4 (four) times daily. Use eye solution for 3 days after eye injections     No current facility-administered medications on file prior to visit.   Allergies  Allergen Reactions   Hydrocodone-Acetaminophen  Other (See Comments)    unknown   Oxycodone Hcl Other (See Comments)    unknown   Penicillins Other (See Comments)    unknown   Prednisone      Patient has noted allergy to prednisone  but she says it was a questionable history of mild nausea with prednisone  and nothing more    Family History  Problem Relation Age of Onset   Macular degeneration Mother    Heart disease Mother        syncope   Aortic stenosis Mother    Lung disease Father 29       mesothelioma   Cancer Maternal Aunt        stomach   Heart disease Paternal Uncle        cabg   Diabetes Paternal Uncle    Heart disease Paternal Uncle    Diabetes Paternal Uncle    Diabetes  Paternal Uncle    Heart disease Paternal Uncle    Heart disease Paternal Uncle    Heart disease Paternal Uncle    Lung cancer Other        asbestos   Diabetes Paternal Grandmother    Cancer Other        lung   PE: BP 130/64   Pulse 77   Ht 5' 3 (1.6 m)   Wt 282 lb (127.9 kg)   SpO2 98%   BMI 49.95 kg/m  Wt Readings from Last 20 Encounters:  10/26/24 282 lb (127.9 kg)  09/17/24 278 lb (126.1 kg)  08/15/24 281 lb (127.5 kg)  07/12/24 277 lb (125.6 kg)  06/20/24 277 lb (125.6 kg)  06/19/24 277 lb (125.6 kg)  06/04/24 284 lb 12.8 oz (129.2  kg)  05/02/24 277 lb 9.6 oz (125.9 kg)  04/02/24 288 lb 12.8 oz (131 kg)  04/02/24 288 lb (130.6 kg)  03/23/24 290 lb (131.5 kg)  03/19/24 290 lb (131.5 kg)  03/19/24 291 lb 6.4 oz (132.2 kg)  03/08/24 283 lb (128.4 kg)  02/20/24 281 lb (127.5 kg)  02/16/24 277 lb 12.8 oz (126 kg)  02/10/24 284 lb 12.8 oz (129.2 kg)  02/09/24 281 lb (127.5 kg)  02/09/24 281 lb (127.5 kg)  01/25/24 273 lb 3.2 oz (123.9 kg)   Constitutional: overweight, in NAD Eyes: EOMI, no exophthalmos ENT: no thyromegaly, no cervical lymphadenopathy Cardiovascular: Regular rate, irregularly irregular rhythm, No MRG, + B LE edema Respiratory: CTA B Musculoskeletal: no deformities,  Skin: no rashes except stasis dermatitis of bilateral lower legs Neurological: no tremor with outstretched hands, but chin tremor  ASSESSMENT: 1. DM2, insulin -dependent, uncontrolled, with complications - DR  2. Obesity class 3  3. HL  PLAN:  1. Patient with history of uncontrolled type 2 diabetes, insulin -dependent, on metformin , weekly GLP-1 receptor agonist and basal/bolus insulin , and now also on SGLT2 inhibitor, added after last visit, due to elevated urinary proteins.  HbA1c at that time.  Was slightly lower, at 7.5%, but still above target.  Sugars are fluctuating within the target range but increases significantly particularly after lunch and remaining almost 100% elevated  after dinner.  Reviewing daily CGM tracings, she had extremely high blood sugars during steroid injections but afterwards sugars did improve.  Unfortunately, she is not able to avoid them.  We did discuss about using more insulin  during steroid treatments. CGM interpretation: -At today's visit, we reviewed her CGM downloads: It appears that 25% of values are in target range (goal >70%), while 75% are higher than 180 (goal <25%), and 0% are lower than 70 (goal <4%).  The calculated average blood sugar is 230.  The projected HbA1c for the next 3 months (GMI) is 8.8%. -Reviewing the CGM trends, sugars appear to be quite elevated, mostly fluctuating above the target range with significant increases particularly after dinner consistent with relaxing her diet.  She also continues to get steroid injections, which are greatly increasing her blood sugars.  For now, we discussed about increasing her Basaglar  to bring the sugars lower towards the target range and I also recommended to take a higher dose of NovoLog  with steroids.  Will also try to switch from Ozempic  to Mounjaro  initially a lower dose, but then hopefully we can increase the dose for a stronger effect on the blood sugars. - I advised her to:  Patient Instructions  Please continue: - Metformin  850 mg in am and 1700 mg at night - Jardiance  10 mg before breakfast  Try to change from Ozempic  to: - Mounjaro  5 mg weekly Let me know if we can increase the dose  Please increase: - Basaglar   36-40 units at bedtime - NovoLog  15 min before meals: 10-20 units before meals (if you inject after the meals, take only 50% of the dose) Around the time of steroids, take a higher NovoLog  dose (maybe up to 30 units).  Please return in 3-4 months.  - we checked her HbA1c: 7.8% (higher, but lower than expected from her CGM) - advised to check sugars at different times of the day - 4x a day, rotating check times - advised for yearly eye exams >> she is UTD -  at last visit, her ACR was elevated, in the 400s.  I recommended to add Jardiance   10 mg daily, which she is not taking.  She also continues on benazepril  20 mg daily.  Will repeat the ACR today. - return to clinic in 3-4 months  2. Obesity class 3  - He continues on Ozempic  max dose, which should also help with weight loss - She gained 11 pounds before last visit and lost 2 lbs since then.  - At today's visit we discussed about possibly switching from Ozempic  to Mounjaro  for a stronger effect on her blood sugars but also on her weight.  3. HL - Lipid panel reviewed from 09/2024: All fractions at goal: Lab Results  Component Value Date   CHOL 110 09/17/2024   HDL 52.40 09/17/2024   LDLCALC 30 09/17/2024   LDLDIRECT 64.0 04/21/2017   TRIG 142.0 09/17/2024   CHOLHDL 2 09/17/2024  - Will continue Lipitor 10 mg daily, Zetia  10 mg daily and fenofibrate  160 mg daily without side effects  Component     Latest Ref Rng 10/26/2024  Hemoglobin A1C     4.0 - 5.6 % 7.8 !   Creatinine, Urine     20 - 275 mg/dL 898   Microalb, Ur     mg/dL 70.9   MICROALB/CREAT RATIO     <30 mg/g creat 287 (H)     Urinary ACR has improved.  I will continue to keep an eye on this-will recheck at next visit.  Will continue Jardiance  and benazepril .  Lela Fendt, MD PhD [facility] Endocrin[facility]

## 2024-10-27 LAB — MICROALBUMIN / CREATININE URINE RATIO
Creatinine, Urine: 101 mg/dL (ref 20–275)
Microalb Creat Ratio: 287 mg/g{creat} — ABNORMAL HIGH
Microalb, Ur: 29 mg/dL

## 2024-10-29 ENCOUNTER — Ambulatory Visit: Payer: Self-pay | Admitting: Internal Medicine

## 2024-10-31 ENCOUNTER — Telehealth: Payer: Self-pay

## 2024-10-31 ENCOUNTER — Encounter: Payer: Self-pay | Admitting: Internal Medicine

## 2024-10-31 NOTE — Telephone Encounter (Signed)
 Pt needs a PA for [employer]

## 2024-11-01 ENCOUNTER — Other Ambulatory Visit (HOSPITAL_COMMUNITY): Payer: Self-pay

## 2024-11-01 ENCOUNTER — Telehealth: Payer: Self-pay

## 2024-11-01 NOTE — Telephone Encounter (Signed)
 Pharmacy Patient Advocate Encounter   Received notification from Pt Calls Messages that prior authorization for Mounjaro  5MG /0.5ML auto-injectors is required/requested.   Insurance verification completed.   The patient is insured through [facility].   Per test claim: PA required; PA submitted to above mentioned insurance via Latent Key/confirmation #/EOC [id number] Status is pending   PA has been approved through 11/01/2025 Case Id: [id number]

## 2024-11-05 ENCOUNTER — Encounter: Payer: Self-pay | Admitting: Family Medicine

## 2024-11-05 ENCOUNTER — Encounter: Payer: Self-pay | Admitting: Internal Medicine

## 2024-11-13 ENCOUNTER — Other Ambulatory Visit (HOSPITAL_COMMUNITY): Payer: Self-pay | Admitting: *Deleted

## 2024-11-13 MED ORDER — APIXABAN 5 MG PO TABS: 5.0000 mg | ORAL_TABLET | Freq: Two times a day (BID) | ORAL | 0 refills | Status: AC

## 2024-11-14 ENCOUNTER — Ambulatory Visit (HOSPITAL_COMMUNITY): Admitting: Physician Assistant

## 2024-11-15 ENCOUNTER — Ambulatory Visit (HOSPITAL_COMMUNITY): Admitting: Physician Assistant

## 2024-11-20 ENCOUNTER — Ambulatory Visit (HOSPITAL_COMMUNITY)
Admission: RE | Admit: 2024-11-20 | Discharge: 2024-11-20 | Disposition: A | Discharge status: Home / Self Care | Source: Ambulatory Visit | Attending: Physician Assistant | Admitting: Physician Assistant

## 2024-11-20 VITALS — BP 164/78 | HR 69 | Ht 63.0 in | Wt 280.8 lb

## 2024-11-20 DIAGNOSIS — Z5181 Encounter for therapeutic drug level monitoring: Secondary | ICD-10-CM

## 2024-11-20 DIAGNOSIS — Z79899 Other long term (current) drug therapy: Secondary | ICD-10-CM

## 2024-11-20 DIAGNOSIS — I480 Paroxysmal atrial fibrillation: Secondary | ICD-10-CM | POA: Diagnosis not present

## 2024-11-20 DIAGNOSIS — I4891 Unspecified atrial fibrillation: Secondary | ICD-10-CM | POA: Diagnosis not present

## 2024-11-20 DIAGNOSIS — D6869 Other thrombophilia: Secondary | ICD-10-CM | POA: Diagnosis not present

## 2024-11-20 NOTE — Progress Notes (Signed)
 "   Primary Care Physician: Antonio Meth, Andrea SAUNDERS, DO Primary Cardiologist: Kardie Tobb, DO Electrophysiologist: OLE ONEIDA HOLTS, MD (Inactive)  Referring Physician: Dr Alvan Record [name] is a 74 y.o. female with a history of DM, HLD, HTN, atrial fibrillation who presents for follow up in the [facility].  The patient was initially diagnosed with atrial fibrillation 01/08/24 after presenting to the ED after a mechanical fall in her kitchen. EMS was called and she was found to be in afib with RVR. She was transported to the ED and started on IV diltiazem  which converted her to SR. Patient was not started on stroke prevention at that time due to a very large hematoma on her left flank. A two week cardiac monitor was ordered which showed 40% afib burden.   Patient is s/p Tikosyn  admission 5/19-22/2025. The patient converted chemically and did not require cardioversion.   Patient returns for follow up for atrial fibrillation and dofetilide  monitoring. She is in SR today and feels well. She has had 2-3 brief episodes of afib since her last visit. She admits she has been under considerable stress with her other medical issues and appointments. No bleeding issues on anticoagulation.   Today, she  denies symptoms of palpitations, chest pain, shortness of breath, orthopnea, PND, lower extremity edema, dizziness, presyncope, syncope, bleeding, or neurologic sequela. The patient is tolerating medications without difficulties and is otherwise without complaint today.    Atrial Fibrillation Risk Factors:  she does have symptoms or diagnosis of sleep apnea. Mild on study 03/13/24 she does not have a history of rheumatic fever. she does not have a history of alcohol use. The patient does have a history of early familial atrial fibrillation or other arrhythmias. Mother had afib.   Atrial Fibrillation Management history:  Previous antiarrhythmic drugs:  Tikosyn  Previous cardioversions: none  Previous ablations: none Anticoagulation history: Eliquis   ROS- All systems are reviewed and negative except as per the HPI above.  Past Medical History:  Diagnosis Date   Allergy    Anxiety    Arthritis    Cataract    Depression    Diabetes mellitus    GERD (gastroesophageal reflux disease)    Hyperlipidemia    Hypertension    Obesity     Current Outpatient Medications  Medication Sig Dispense Refill   acetaminophen  (TYLENOL ) 650 MG CR tablet Take 1,300 mg by mouth 2 (two) times daily as needed for pain. For pain (Patient taking differently: Take 1,300 mg by mouth as needed for pain. For pain)     apixaban  (ELIQUIS ) 5 MG TABS tablet Take 1 tablet (5 mg total) by mouth 2 (two) times daily. 60 tablet 0   atorvastatin  (LIPITOR) 20 MG tablet TAKE 1 TABLET AT BEDTIME 90 tablet 3   benazepril  (LOTENSIN ) 20 MG tablet Take 1 tablet (20 mg total) by mouth daily. 90 tablet 1   Continuous Glucose Receiver (FREESTYLE LIBRE 3 READER) DEVI Use as directed. 1 each 0   Continuous Glucose Sensor (FREESTYLE LIBRE 3 SENSOR) MISC Place 1 sensor on the skin every 14 days. Use to check glucose continuously 6 each 3   diltiazem  (CARDIZEM  CD) 240 MG 24 hr capsule Take 1 capsule (240 mg total) by mouth daily. 90 capsule 2   dofetilide  (TIKOSYN ) 500 MCG capsule Take 1 capsule (500 mcg total) by mouth 2 (two) times daily. 180 capsule 2   DULoxetine  (CYMBALTA ) 60 MG capsule Take 1 capsule (60 mg total)  by mouth daily. 90 capsule 3   empagliflozin  (JARDIANCE ) 10 MG TABS tablet Take 1 tablet (10 mg total) by mouth daily before breakfast. 90 tablet 3   estradiol  (ESTRACE ) 1 MG tablet Take 1 tablet (1 mg total) by mouth daily. 90 tablet 0   ezetimibe  (ZETIA ) 10 MG tablet TAKE 1 TABLET DAILY 90 tablet 3   fenofibrate  160 MG tablet TAKE 1 TABLET DAILY 90 tablet 0   FLUoxetine  (PROZAC ) 20 MG tablet TAKE 3 TABLETS DAILY 270 tablet 1   insulin  aspart (NOVOLOG  FLEXPEN) 100  UNIT/ML FlexPen Inject 12-15 Units into the skin 3 (three) times daily with meals. INJECT 12 TO 15 UNITS SUBCUTANEOUSLY 3 TIMES A   DAY WITH MEALS 45 mL 3   Insulin  Glargine (BASAGLAR  KWIKPEN) 100 UNIT/ML Inject 30 Units into the skin daily.     magnesium  oxide (MAG-OX) 400 MG tablet Take 1 tablet (400 mg total) by mouth 2 (two) times daily. 180 tablet 2   metFORMIN  (GLUCOPHAGE ) 850 MG tablet TAKE 1 TABLET EVERY MORNINGAND TAKE 2 TABLETS EVERY   EVENING 270 tablet 3   OZEMPIC , 2 MG/DOSE, 8 MG/3ML SOPN INJECT 2MG  SUBCUTANEOUSLY  ONCE WEEKLY (EVERY 7 DAYS) AS DIRECTED 9 mL 3   tobramycin (TOBREX) 0.3 % ophthalmic solution Place 1 drop into both eyes 4 (four) times daily. Use eye solution for 3 days after eye injections     tirzepatide  (MOUNJARO ) 5 MG/0.5ML Pen Inject 5 mg into the skin once a week. (Patient not taking: Reported on 11/20/2024) 2 mL 3   No current facility-administered medications for this encounter.    Physical Exam: BP (!) 164/78   Pulse 69   Ht 5' 3 (1.6 m)   Wt 127.4 kg   BMI 49.74 kg/m   GEN: Well nourished, well developed in no acute distress CARDIAC: Regular rate and rhythm, no murmurs, rubs, gallops RESPIRATORY:  Clear to auscultation without rales, wheezing or rhonchi  ABDOMEN: Soft, non-tender, non-distended EXTREMITIES:  No edema; No deformity    Wt Readings from Last 3 Encounters:  11/20/24 127.4 kg  10/26/24 127.9 kg  09/17/24 126.1 kg     EKG Interpretation Date/Time:  Tuesday November 20 2024 10:52:54 EST Ventricular Rate:  69 PR Interval:  182 QRS Duration:  76 QT Interval:  404 QTC Calculation: 432 R Axis:   95  Text Interpretation: Normal sinus rhythm Rightward axis Low voltage QRS Borderline ECG When compared with ECG of 15-Aug-2024 08:34, Premature ventricular complexes are no longer Present Confirmed by Adelfa Lozito ([id number]) on 11/20/2024 11:34:09 AM    Echo 01/09/24 demonstrated   1. Left ventricular ejection fraction, by estimation, is 55 to  60%. The  left ventricle has normal function. The left ventricle has no regional  wall motion abnormalities. There is mild concentric left ventricular  hypertrophy. Left ventricular diastolic parameters were normal.   2. Right ventricular systolic function is normal. The right ventricular  size is normal. Mildly increased right ventricular wall thickness.  Tricuspid regurgitation signal is inadequate for assessing PA pressure.   3. Left atrial size was mildly dilated.   4. A small pericardial effusion is present. The pericardial effusion is  posterior to the left ventricle. There is no evidence of cardiac  tamponade.   5. The mitral valve is normal in structure. No evidence of mitral valve  regurgitation.   6. The aortic valve is tricuspid. Aortic valve regurgitation is not  visualized.   7. The inferior vena cava is normal in  size with <50% respiratory  variability, suggesting right atrial pressure of 8 mmHg.    CHA2DS2-VASc Score = 4  The patient's score is based upon: CHF History: 0 HTN History: 1 Diabetes History: 1 Stroke History: 0 Vascular Disease History: 0 Age Score: 1 Gender Score: 1       ASSESSMENT AND PLAN: Paroxysmal Atrial Fibrillation (ICD10:  I48.0) The patient's CHA2DS2-VASc score is 4, indicating a 4.8% annual risk of stroke.   S/p dofetilide  loading 03/2024 Patient appears to be maintaining SR. Continue dofetilide  500 mcg BID Continue diltiazem  240 mg daily Continue Eliquis  5 mg BID  Secondary Hypercoagulable State (ICD10:  D68.69) The patient is at significant risk for stroke/thromboembolism based upon her CHA2DS2-VASc Score of 4.  Continue Apixaban  (Eliquis ). No bleeding issues.   High Risk Medication Monitoring (ICD 10: [id number]) Patient requires ongoing monitoring for anti-arrhythmic medication which has the potential to cause life threatening arrhythmias. QT interval on ECG acceptable for dofetilide  monitoring. Check bmet/mag today.     OSA   Mild, not currently on CPAP    HTN Stable on current regimen  Obesity Body mass index is 49.74 kg/m.  Encouraged lifestyle modification On Mounjaro    Follow up in the AF clinic in 3 months.    Daril Kicks PA-C [facility] [address] [place], [place] [address] [phone] "

## 2024-11-21 ENCOUNTER — Ambulatory Visit (HOSPITAL_COMMUNITY): Payer: Self-pay | Admitting: Physician Assistant

## 2024-11-21 ENCOUNTER — Other Ambulatory Visit: Payer: Self-pay | Admitting: Internal Medicine

## 2024-11-21 ENCOUNTER — Other Ambulatory Visit: Payer: Self-pay | Admitting: Family Medicine

## 2024-11-21 DIAGNOSIS — Z780 Asymptomatic menopausal state: Secondary | ICD-10-CM

## 2024-11-21 DIAGNOSIS — F418 Other specified anxiety disorders: Secondary | ICD-10-CM

## 2024-11-21 DIAGNOSIS — E11319 Type 2 diabetes mellitus with unspecified diabetic retinopathy without macular edema: Secondary | ICD-10-CM

## 2024-11-21 LAB — BASIC METABOLIC PANEL WITH GFR
BUN/Creatinine Ratio: 27 (ref 12–28)
BUN: 26 mg/dL (ref 8–27)
CO2: 21 mmol/L (ref 20–29)
Calcium: 9.2 mg/dL (ref 8.7–10.3)
Chloride: 102 mmol/L (ref 96–106)
Creatinine, Ser: 0.95 mg/dL (ref 0.57–1.00)
Glucose: 155 mg/dL — ABNORMAL HIGH (ref 70–99)
Potassium: 5.3 mmol/L — ABNORMAL HIGH (ref 3.5–5.2)
Sodium: 141 mmol/L (ref 134–144)
eGFR: 63 mL/min/1.73

## 2024-11-21 LAB — MAGNESIUM: Magnesium: 2.2 mg/dL (ref 1.6–2.3)

## 2024-11-21 NOTE — Telephone Encounter (Signed)
 RX addended to reflect most recent notes

## 2024-11-27 ENCOUNTER — Other Ambulatory Visit: Payer: Self-pay | Admitting: Family Medicine

## 2024-11-27 DIAGNOSIS — E785 Hyperlipidemia, unspecified: Secondary | ICD-10-CM

## 2024-12-06 ENCOUNTER — Encounter: Payer: Self-pay | Admitting: Family Medicine

## 2024-12-06 ENCOUNTER — Encounter: Payer: Self-pay | Admitting: Internal Medicine

## 2024-12-07 ENCOUNTER — Other Ambulatory Visit: Payer: Self-pay | Admitting: Family

## 2025-01-14 ENCOUNTER — Ambulatory Visit: Admitting: Podiatry

## 2025-02-26 ENCOUNTER — Ambulatory Visit: Admitting: Internal Medicine

## 2025-02-27 ENCOUNTER — Ambulatory Visit (HOSPITAL_COMMUNITY): Admitting: Physician Assistant

## 2025-06-27 ENCOUNTER — Ambulatory Visit
